# Patient Record
Sex: Male | Born: 1945
Health system: Southern US, Community
[De-identification: ages and names within clinical notes are randomized; demographics above are authoritative.]

## PROBLEM LIST (undated history)

## (undated) DIAGNOSIS — C61 Malignant neoplasm of prostate: Secondary | ICD-10-CM

## (undated) DIAGNOSIS — G5600 Carpal tunnel syndrome, unspecified upper limb: Secondary | ICD-10-CM

## (undated) DIAGNOSIS — G839 Paralytic syndrome, unspecified: Secondary | ICD-10-CM

## (undated) DIAGNOSIS — I10 Essential (primary) hypertension: Secondary | ICD-10-CM

## (undated) DIAGNOSIS — E291 Testicular hypofunction: Secondary | ICD-10-CM

## (undated) DIAGNOSIS — M545 Low back pain, unspecified: Secondary | ICD-10-CM

## (undated) DIAGNOSIS — K219 Gastro-esophageal reflux disease without esophagitis: Secondary | ICD-10-CM

## (undated) DIAGNOSIS — K279 Peptic ulcer, site unspecified, unspecified as acute or chronic, without hemorrhage or perforation: Secondary | ICD-10-CM

## (undated) DIAGNOSIS — F419 Anxiety disorder, unspecified: Secondary | ICD-10-CM

## (undated) DIAGNOSIS — F329 Major depressive disorder, single episode, unspecified: Secondary | ICD-10-CM

## (undated) DIAGNOSIS — N529 Male erectile dysfunction, unspecified: Secondary | ICD-10-CM

## (undated) DIAGNOSIS — G8220 Paraplegia, unspecified: Secondary | ICD-10-CM

## (undated) DIAGNOSIS — Z8546 Personal history of malignant neoplasm of prostate: Secondary | ICD-10-CM

## (undated) DIAGNOSIS — K589 Irritable bowel syndrome without diarrhea: Secondary | ICD-10-CM

## (undated) DIAGNOSIS — G4733 Obstructive sleep apnea (adult) (pediatric): Secondary | ICD-10-CM

## (undated) DIAGNOSIS — S24109A Unspecified injury at unspecified level of thoracic spinal cord, initial encounter: Secondary | ICD-10-CM

## (undated) DIAGNOSIS — R5383 Other fatigue: Secondary | ICD-10-CM

## (undated) DIAGNOSIS — F32A Depression, unspecified: Secondary | ICD-10-CM

## (undated) DIAGNOSIS — J309 Allergic rhinitis, unspecified: Secondary | ICD-10-CM

## (undated) HISTORY — DX: Paralytic syndrome, unspecified: G83.9

## (undated) HISTORY — DX: Essential (primary) hypertension: I10

## (undated) HISTORY — DX: Anxiety disorder, unspecified: F41.9

## (undated) HISTORY — DX: Carpal tunnel syndrome, unspecified upper limb: G56.00

## (undated) HISTORY — DX: Paraplegia, unspecified: G82.20

## (undated) HISTORY — DX: Peptic ulcer, site unspecified, unspecified as acute or chronic, without hemorrhage or perforation: K27.9

## (undated) HISTORY — DX: Personal history of malignant neoplasm of prostate: Z85.46

## (undated) HISTORY — DX: Depression, unspecified: F32.A

## (undated) HISTORY — DX: Other fatigue: R53.83

## (undated) HISTORY — DX: Gastro-esophageal reflux disease without esophagitis: K21.9

## (undated) HISTORY — DX: Low back pain: M54.5

## (undated) HISTORY — PX: OTHER SURGICAL HISTORY: SHX169

## (undated) HISTORY — DX: Major depressive disorder, single episode, unspecified: F32.9

## (undated) HISTORY — DX: Unspecified injury at unspecified level of thoracic spinal cord, initial encounter: S24.109A

## (undated) HISTORY — DX: Malignant neoplasm of prostate: C61

## (undated) HISTORY — PX: PILONIDAL CYST / SINUS EXCISION: SUR543

## (undated) HISTORY — DX: Testicular hypofunction: E29.1

## (undated) HISTORY — DX: Obstructive sleep apnea (adult) (pediatric): G47.33

## (undated) HISTORY — DX: Male erectile dysfunction, unspecified: N52.9

## (undated) HISTORY — DX: Allergic rhinitis, unspecified: J30.9

## (undated) HISTORY — DX: Low back pain, unspecified: M54.50

## (undated) HISTORY — DX: Irritable bowel syndrome, unspecified: K58.9

## (undated) HISTORY — PX: PROSTATECTOMY: SHX69

---

## 1998-10-12 ENCOUNTER — Ambulatory Visit (HOSPITAL_COMMUNITY): Admission: RE | Admit: 1998-10-12 | Discharge: 1998-10-12 | Payer: Self-pay | Admitting: *Deleted

## 1999-07-03 ENCOUNTER — Emergency Department (HOSPITAL_COMMUNITY): Admission: EM | Admit: 1999-07-03 | Discharge: 1999-07-03 | Payer: Self-pay | Admitting: Emergency Medicine

## 1999-07-03 ENCOUNTER — Encounter: Payer: Self-pay | Admitting: Emergency Medicine

## 1999-09-14 ENCOUNTER — Other Ambulatory Visit: Admission: RE | Admit: 1999-09-14 | Discharge: 1999-09-14 | Payer: Self-pay | Admitting: Gastroenterology

## 1999-09-14 ENCOUNTER — Encounter (INDEPENDENT_AMBULATORY_CARE_PROVIDER_SITE_OTHER): Payer: Self-pay | Admitting: Specialist

## 2000-10-12 ENCOUNTER — Encounter: Payer: Self-pay | Admitting: Internal Medicine

## 2000-10-12 ENCOUNTER — Ambulatory Visit (HOSPITAL_COMMUNITY): Admission: RE | Admit: 2000-10-12 | Discharge: 2000-10-12 | Payer: Self-pay | Admitting: Internal Medicine

## 2001-07-25 ENCOUNTER — Emergency Department (HOSPITAL_COMMUNITY): Admission: EM | Admit: 2001-07-25 | Discharge: 2001-07-25 | Payer: Self-pay | Admitting: Emergency Medicine

## 2002-08-14 ENCOUNTER — Ambulatory Visit (HOSPITAL_BASED_OUTPATIENT_CLINIC_OR_DEPARTMENT_OTHER): Admission: RE | Admit: 2002-08-14 | Discharge: 2002-08-14 | Payer: Self-pay | Admitting: Internal Medicine

## 2002-08-14 ENCOUNTER — Encounter: Payer: Self-pay | Admitting: Pulmonary Disease

## 2002-09-17 ENCOUNTER — Encounter: Payer: Self-pay | Admitting: Pulmonary Disease

## 2002-10-06 ENCOUNTER — Encounter: Payer: Self-pay | Admitting: Urology

## 2002-10-09 ENCOUNTER — Encounter (INDEPENDENT_AMBULATORY_CARE_PROVIDER_SITE_OTHER): Payer: Self-pay | Admitting: Specialist

## 2002-10-09 ENCOUNTER — Inpatient Hospital Stay (HOSPITAL_COMMUNITY): Admission: RE | Admit: 2002-10-09 | Discharge: 2002-10-12 | Payer: Self-pay | Admitting: Urology

## 2003-05-07 ENCOUNTER — Ambulatory Visit (HOSPITAL_BASED_OUTPATIENT_CLINIC_OR_DEPARTMENT_OTHER): Admission: RE | Admit: 2003-05-07 | Discharge: 2003-05-07 | Payer: Self-pay | Admitting: Urology

## 2005-04-05 ENCOUNTER — Ambulatory Visit: Payer: Self-pay | Admitting: Internal Medicine

## 2005-08-02 ENCOUNTER — Ambulatory Visit: Payer: Self-pay | Admitting: Pulmonary Disease

## 2006-07-27 ENCOUNTER — Ambulatory Visit: Payer: Self-pay | Admitting: Pulmonary Disease

## 2006-07-30 ENCOUNTER — Ambulatory Visit: Payer: Self-pay | Admitting: Internal Medicine

## 2006-09-18 ENCOUNTER — Ambulatory Visit: Payer: Self-pay | Admitting: Internal Medicine

## 2006-11-09 ENCOUNTER — Ambulatory Visit: Payer: Self-pay | Admitting: Internal Medicine

## 2007-05-23 DIAGNOSIS — G4733 Obstructive sleep apnea (adult) (pediatric): Secondary | ICD-10-CM

## 2007-05-23 DIAGNOSIS — Z8546 Personal history of malignant neoplasm of prostate: Secondary | ICD-10-CM

## 2007-05-23 DIAGNOSIS — I10 Essential (primary) hypertension: Secondary | ICD-10-CM

## 2007-05-23 DIAGNOSIS — K219 Gastro-esophageal reflux disease without esophagitis: Secondary | ICD-10-CM | POA: Insufficient documentation

## 2007-05-23 DIAGNOSIS — S56129A Laceration of flexor muscle, fascia and tendon of unspecified finger at forearm level, initial encounter: Secondary | ICD-10-CM | POA: Insufficient documentation

## 2007-05-23 DIAGNOSIS — G5600 Carpal tunnel syndrome, unspecified upper limb: Secondary | ICD-10-CM

## 2007-05-23 DIAGNOSIS — M109 Gout, unspecified: Secondary | ICD-10-CM

## 2007-05-23 HISTORY — DX: Personal history of malignant neoplasm of prostate: Z85.46

## 2007-08-06 ENCOUNTER — Ambulatory Visit: Payer: Self-pay | Admitting: Pulmonary Disease

## 2007-10-01 ENCOUNTER — Ambulatory Visit: Payer: Self-pay | Admitting: Internal Medicine

## 2007-10-01 DIAGNOSIS — J309 Allergic rhinitis, unspecified: Secondary | ICD-10-CM | POA: Insufficient documentation

## 2007-10-01 DIAGNOSIS — Z8719 Personal history of other diseases of the digestive system: Secondary | ICD-10-CM

## 2007-10-01 DIAGNOSIS — K279 Peptic ulcer, site unspecified, unspecified as acute or chronic, without hemorrhage or perforation: Secondary | ICD-10-CM

## 2007-10-01 DIAGNOSIS — M545 Low back pain: Secondary | ICD-10-CM

## 2007-10-01 DIAGNOSIS — F331 Major depressive disorder, recurrent, moderate: Secondary | ICD-10-CM | POA: Insufficient documentation

## 2007-10-01 DIAGNOSIS — F528 Other sexual dysfunction not due to a substance or known physiological condition: Secondary | ICD-10-CM

## 2007-10-01 DIAGNOSIS — R5383 Other fatigue: Secondary | ICD-10-CM

## 2007-10-01 DIAGNOSIS — F329 Major depressive disorder, single episode, unspecified: Secondary | ICD-10-CM

## 2007-10-01 DIAGNOSIS — F411 Generalized anxiety disorder: Secondary | ICD-10-CM

## 2007-10-01 DIAGNOSIS — F332 Major depressive disorder, recurrent severe without psychotic features: Secondary | ICD-10-CM | POA: Insufficient documentation

## 2007-10-01 HISTORY — DX: Peptic ulcer, site unspecified, unspecified as acute or chronic, without hemorrhage or perforation: K27.9

## 2007-10-01 HISTORY — DX: Allergic rhinitis, unspecified: J30.9

## 2007-12-01 ENCOUNTER — Emergency Department (HOSPITAL_COMMUNITY): Admission: EM | Admit: 2007-12-01 | Discharge: 2007-12-01 | Payer: Self-pay | Admitting: Emergency Medicine

## 2007-12-13 ENCOUNTER — Ambulatory Visit (HOSPITAL_BASED_OUTPATIENT_CLINIC_OR_DEPARTMENT_OTHER): Admission: RE | Admit: 2007-12-13 | Discharge: 2007-12-13 | Payer: Self-pay | Admitting: Orthopedic Surgery

## 2008-01-09 ENCOUNTER — Encounter: Payer: Self-pay | Admitting: Internal Medicine

## 2008-01-27 ENCOUNTER — Encounter: Payer: Self-pay | Admitting: Internal Medicine

## 2008-02-03 ENCOUNTER — Telehealth: Payer: Self-pay | Admitting: Pulmonary Disease

## 2008-02-17 ENCOUNTER — Encounter: Payer: Self-pay | Admitting: Internal Medicine

## 2008-02-20 ENCOUNTER — Encounter: Payer: Self-pay | Admitting: Pulmonary Disease

## 2008-03-31 ENCOUNTER — Ambulatory Visit: Payer: Self-pay | Admitting: Internal Medicine

## 2008-03-31 DIAGNOSIS — E291 Testicular hypofunction: Secondary | ICD-10-CM

## 2008-03-31 HISTORY — DX: Testicular hypofunction: E29.1

## 2008-04-15 ENCOUNTER — Encounter: Payer: Self-pay | Admitting: Pulmonary Disease

## 2008-07-28 ENCOUNTER — Ambulatory Visit: Payer: Self-pay | Admitting: Internal Medicine

## 2008-07-28 DIAGNOSIS — S8990XA Unspecified injury of unspecified lower leg, initial encounter: Secondary | ICD-10-CM

## 2008-07-28 DIAGNOSIS — S79912A Unspecified injury of left hip, initial encounter: Secondary | ICD-10-CM

## 2008-07-28 DIAGNOSIS — L089 Local infection of the skin and subcutaneous tissue, unspecified: Secondary | ICD-10-CM

## 2008-07-28 DIAGNOSIS — S90919A Unspecified superficial injury of unspecified ankle, initial encounter: Secondary | ICD-10-CM | POA: Insufficient documentation

## 2008-07-28 DIAGNOSIS — S99919A Unspecified injury of unspecified ankle, initial encounter: Secondary | ICD-10-CM

## 2008-07-28 DIAGNOSIS — S79929A Unspecified injury of unspecified thigh, initial encounter: Secondary | ICD-10-CM

## 2008-08-03 ENCOUNTER — Ambulatory Visit: Payer: Self-pay | Admitting: Pulmonary Disease

## 2008-10-06 ENCOUNTER — Ambulatory Visit: Payer: Self-pay | Admitting: Internal Medicine

## 2008-10-06 LAB — CONVERTED CEMR LAB
ALT: 33 units/L (ref 0–53)
AST: 28 units/L (ref 0–37)
Albumin: 3.9 g/dL (ref 3.5–5.2)
BUN: 20 mg/dL (ref 6–23)
Basophils Relative: 0.7 % (ref 0.0–3.0)
Chloride: 103 meq/L (ref 96–112)
Cholesterol: 151 mg/dL (ref 0–200)
Creatinine, Ser: 1 mg/dL (ref 0.4–1.5)
Eosinophils Absolute: 0.1 10*3/uL (ref 0.0–0.7)
Eosinophils Relative: 1.7 % (ref 0.0–5.0)
GFR calc non Af Amer: 81 mL/min
HCT: 46.4 % (ref 39.0–52.0)
Hemoglobin: 16.2 g/dL (ref 13.0–17.0)
MCV: 90 fL (ref 78.0–100.0)
Neutrophils Relative %: 66.3 % (ref 43.0–77.0)
PSA: 0.01 ng/mL — ABNORMAL LOW (ref 0.10–4.00)
RBC: 5.15 M/uL (ref 4.22–5.81)
Specific Gravity, Urine: 1.025 (ref 1.000–1.03)
Total Protein, Urine: NEGATIVE mg/dL
Urine Glucose: NEGATIVE mg/dL
Urobilinogen, UA: 0.2 (ref 0.0–1.0)
WBC: 7.3 10*3/uL (ref 4.5–10.5)
pH: 5 (ref 5.0–8.0)

## 2008-10-12 ENCOUNTER — Ambulatory Visit: Payer: Self-pay | Admitting: Internal Medicine

## 2008-10-12 DIAGNOSIS — J069 Acute upper respiratory infection, unspecified: Secondary | ICD-10-CM | POA: Insufficient documentation

## 2008-10-12 DIAGNOSIS — R269 Unspecified abnormalities of gait and mobility: Secondary | ICD-10-CM

## 2008-10-15 ENCOUNTER — Encounter (INDEPENDENT_AMBULATORY_CARE_PROVIDER_SITE_OTHER): Payer: Self-pay | Admitting: *Deleted

## 2008-10-17 ENCOUNTER — Encounter: Admission: RE | Admit: 2008-10-17 | Discharge: 2008-10-17 | Payer: Self-pay | Admitting: Internal Medicine

## 2008-10-20 ENCOUNTER — Telehealth (INDEPENDENT_AMBULATORY_CARE_PROVIDER_SITE_OTHER): Payer: Self-pay | Admitting: *Deleted

## 2008-11-25 ENCOUNTER — Encounter: Payer: Self-pay | Admitting: Internal Medicine

## 2008-11-26 ENCOUNTER — Encounter: Payer: Self-pay | Admitting: Internal Medicine

## 2008-12-09 ENCOUNTER — Telehealth: Payer: Self-pay | Admitting: Internal Medicine

## 2009-01-25 ENCOUNTER — Encounter: Payer: Self-pay | Admitting: Internal Medicine

## 2009-02-12 ENCOUNTER — Inpatient Hospital Stay (HOSPITAL_COMMUNITY): Admission: RE | Admit: 2009-02-12 | Discharge: 2009-02-18 | Payer: Self-pay | Admitting: Neurosurgery

## 2009-02-12 ENCOUNTER — Ambulatory Visit: Payer: Self-pay | Admitting: Pulmonary Disease

## 2009-02-12 ENCOUNTER — Encounter (INDEPENDENT_AMBULATORY_CARE_PROVIDER_SITE_OTHER): Payer: Self-pay | Admitting: Neurosurgery

## 2009-02-16 ENCOUNTER — Ambulatory Visit: Payer: Self-pay | Admitting: Physical Medicine & Rehabilitation

## 2009-02-18 ENCOUNTER — Inpatient Hospital Stay (HOSPITAL_COMMUNITY)
Admission: RE | Admit: 2009-02-18 | Discharge: 2009-03-05 | Payer: Self-pay | Admitting: Physical Medicine & Rehabilitation

## 2009-03-05 ENCOUNTER — Encounter: Payer: Self-pay | Admitting: Internal Medicine

## 2009-03-08 ENCOUNTER — Telehealth (INDEPENDENT_AMBULATORY_CARE_PROVIDER_SITE_OTHER): Payer: Self-pay | Admitting: *Deleted

## 2009-03-10 ENCOUNTER — Emergency Department (HOSPITAL_COMMUNITY): Admission: EM | Admit: 2009-03-10 | Discharge: 2009-03-10 | Payer: Self-pay | Admitting: Emergency Medicine

## 2009-04-07 ENCOUNTER — Encounter
Admission: RE | Admit: 2009-04-07 | Discharge: 2009-06-04 | Payer: Self-pay | Admitting: Physical Medicine & Rehabilitation

## 2009-04-08 ENCOUNTER — Encounter: Payer: Self-pay | Admitting: Internal Medicine

## 2009-04-13 ENCOUNTER — Encounter: Payer: Self-pay | Admitting: Pulmonary Disease

## 2009-04-23 ENCOUNTER — Ambulatory Visit: Payer: Self-pay | Admitting: Internal Medicine

## 2009-04-23 DIAGNOSIS — G8220 Paraplegia, unspecified: Secondary | ICD-10-CM

## 2009-04-23 DIAGNOSIS — G839 Paralytic syndrome, unspecified: Secondary | ICD-10-CM

## 2009-04-23 HISTORY — DX: Paraplegia, unspecified: G82.20

## 2009-04-23 HISTORY — DX: Paralytic syndrome, unspecified: G83.9

## 2009-05-18 ENCOUNTER — Encounter
Admission: RE | Admit: 2009-05-18 | Discharge: 2009-08-16 | Payer: Self-pay | Admitting: Physical Medicine & Rehabilitation

## 2009-05-19 ENCOUNTER — Ambulatory Visit: Payer: Self-pay | Admitting: Physical Medicine & Rehabilitation

## 2009-06-30 ENCOUNTER — Ambulatory Visit: Payer: Self-pay | Admitting: Physical Medicine & Rehabilitation

## 2009-08-04 ENCOUNTER — Ambulatory Visit: Payer: Self-pay | Admitting: Pulmonary Disease

## 2009-08-10 ENCOUNTER — Ambulatory Visit: Payer: Self-pay | Admitting: Physical Medicine & Rehabilitation

## 2009-09-01 ENCOUNTER — Encounter (INDEPENDENT_AMBULATORY_CARE_PROVIDER_SITE_OTHER): Payer: Self-pay | Admitting: *Deleted

## 2009-09-20 ENCOUNTER — Encounter
Admission: RE | Admit: 2009-09-20 | Discharge: 2009-11-03 | Payer: Self-pay | Admitting: Physical Medicine & Rehabilitation

## 2009-09-21 ENCOUNTER — Ambulatory Visit: Payer: Self-pay | Admitting: Physical Medicine & Rehabilitation

## 2009-10-11 ENCOUNTER — Encounter: Payer: Self-pay | Admitting: Internal Medicine

## 2009-10-20 ENCOUNTER — Ambulatory Visit: Payer: Self-pay | Admitting: Internal Medicine

## 2009-10-20 LAB — CONVERTED CEMR LAB
ALT: 20 units/L (ref 0–53)
AST: 21 units/L (ref 0–37)
BUN: 23 mg/dL (ref 6–23)
Bilirubin, Direct: 0.1 mg/dL (ref 0.0–0.3)
Calcium: 9.2 mg/dL (ref 8.4–10.5)
Creatinine, Ser: 1 mg/dL (ref 0.4–1.5)
Eosinophils Relative: 2.4 % (ref 0.0–5.0)
GFR calc non Af Amer: 80.55 mL/min (ref >60)
Hemoglobin, Urine: NEGATIVE
Monocytes Relative: 8.1 % (ref 3.0–12.0)
Neutrophils Relative %: 68.5 % (ref 43.0–77.0)
Platelets: 213 10*3/uL (ref 150.0–400.0)
TSH: 1.55 microintl units/mL (ref 0.35–5.50)
Total Bilirubin: 0.7 mg/dL (ref 0.3–1.2)
Total CHOL/HDL Ratio: 5
Triglycerides: 209 mg/dL — ABNORMAL HIGH (ref 0.0–149.0)
Urine Glucose: NEGATIVE mg/dL
Urobilinogen, UA: 0.2 (ref 0.0–1.0)
VLDL: 41.8 mg/dL — ABNORMAL HIGH (ref 0.0–40.0)
WBC: 6.2 10*3/uL (ref 4.5–10.5)

## 2009-10-27 ENCOUNTER — Encounter: Payer: Self-pay | Admitting: Internal Medicine

## 2009-11-23 ENCOUNTER — Encounter: Payer: Self-pay | Admitting: Internal Medicine

## 2009-12-24 ENCOUNTER — Encounter
Admission: RE | Admit: 2009-12-24 | Discharge: 2009-12-29 | Payer: Self-pay | Admitting: Physical Medicine & Rehabilitation

## 2009-12-29 ENCOUNTER — Ambulatory Visit: Payer: Self-pay | Admitting: Physical Medicine & Rehabilitation

## 2010-04-06 ENCOUNTER — Encounter: Payer: Self-pay | Admitting: Pulmonary Disease

## 2010-04-13 ENCOUNTER — Encounter: Payer: Self-pay | Admitting: Internal Medicine

## 2010-05-18 ENCOUNTER — Encounter
Admission: RE | Admit: 2010-05-18 | Discharge: 2010-05-27 | Payer: Self-pay | Admitting: Physical Medicine & Rehabilitation

## 2010-05-24 ENCOUNTER — Telehealth: Payer: Self-pay | Admitting: Gastroenterology

## 2010-05-27 ENCOUNTER — Ambulatory Visit: Payer: Self-pay | Admitting: Physical Medicine & Rehabilitation

## 2010-08-01 ENCOUNTER — Encounter: Payer: Self-pay | Admitting: Internal Medicine

## 2010-08-03 ENCOUNTER — Ambulatory Visit: Payer: Self-pay | Admitting: Pulmonary Disease

## 2010-08-25 ENCOUNTER — Telehealth: Payer: Self-pay | Admitting: Internal Medicine

## 2010-08-25 ENCOUNTER — Inpatient Hospital Stay (HOSPITAL_COMMUNITY): Admission: EM | Admit: 2010-08-25 | Discharge: 2010-08-28 | Payer: Self-pay | Admitting: Emergency Medicine

## 2010-09-01 ENCOUNTER — Ambulatory Visit: Payer: Self-pay | Admitting: Internal Medicine

## 2010-09-01 DIAGNOSIS — N390 Urinary tract infection, site not specified: Secondary | ICD-10-CM | POA: Insufficient documentation

## 2010-09-01 DIAGNOSIS — M549 Dorsalgia, unspecified: Secondary | ICD-10-CM | POA: Insufficient documentation

## 2010-09-19 ENCOUNTER — Encounter
Admission: RE | Admit: 2010-09-19 | Discharge: 2010-12-06 | Payer: Self-pay | Source: Home / Self Care | Attending: Physical Medicine & Rehabilitation | Admitting: Physical Medicine & Rehabilitation

## 2010-09-27 ENCOUNTER — Ambulatory Visit: Payer: Self-pay | Admitting: Physical Medicine & Rehabilitation

## 2010-11-03 ENCOUNTER — Ambulatory Visit: Payer: Self-pay | Admitting: Internal Medicine

## 2010-11-03 DIAGNOSIS — G4700 Insomnia, unspecified: Secondary | ICD-10-CM

## 2010-11-03 LAB — CONVERTED CEMR LAB
Albumin: 4.3 g/dL (ref 3.5–5.2)
Basophils Absolute: 0 10*3/uL (ref 0.0–0.1)
Basophils Relative: 0.2 % (ref 0.0–3.0)
CO2: 28 meq/L (ref 19–32)
Calcium: 8.9 mg/dL (ref 8.4–10.5)
Chloride: 101 meq/L (ref 96–112)
Cholesterol: 144 mg/dL (ref 0–200)
Eosinophils Absolute: 0.2 10*3/uL (ref 0.0–0.7)
Glucose, Bld: 75 mg/dL (ref 70–99)
HCT: 48.3 % (ref 39.0–52.0)
HDL: 32.5 mg/dL — ABNORMAL LOW (ref >39.00)
Hemoglobin: 16.6 g/dL (ref 13.0–17.0)
Lymphs Abs: 2.1 10*3/uL (ref 0.7–4.0)
MCHC: 34.3 g/dL (ref 30.0–36.0)
MCV: 90.4 fL (ref 78.0–100.0)
Monocytes Absolute: 0.6 10*3/uL (ref 0.1–1.0)
Neutro Abs: 5.6 10*3/uL (ref 1.4–7.7)
RBC: 5.34 M/uL (ref 4.22–5.81)
RDW: 14 % (ref 11.5–14.6)
Sodium: 138 meq/L (ref 135–145)
TSH: 1.81 microintl units/mL (ref 0.35–5.50)
Total Protein: 7 g/dL (ref 6.0–8.3)
VLDL: 31.4 mg/dL (ref 0.0–40.0)

## 2010-12-04 LAB — CONVERTED CEMR LAB
Basophils Absolute: 0 10*3/uL (ref 0.0–0.1)
Bilirubin, Direct: 0.2 mg/dL (ref 0.0–0.3)
Cholesterol: 169 mg/dL (ref 0–200)
Eosinophils Absolute: 0.1 10*3/uL (ref 0.0–0.6)
Eosinophils Relative: 1.3 % (ref 0.0–5.0)
GFR calc Af Amer: 73 mL/min
GFR calc non Af Amer: 60 mL/min
Glucose, Bld: 99 mg/dL (ref 70–99)
HCT: 47 % (ref 39.0–52.0)
HDL: 32.4 mg/dL — ABNORMAL LOW (ref >39.0)
Hemoglobin, Urine: NEGATIVE
LDL Cholesterol: 99 mg/dL (ref 0–99)
Lymphocytes Relative: 26.3 % (ref 12.0–46.0)
MCHC: 34.6 g/dL (ref 30.0–36.0)
MCV: 89.6 fL (ref 78.0–100.0)
Neutro Abs: 4.9 10*3/uL (ref 1.4–7.7)
Neutrophils Relative %: 64.6 % (ref 43.0–77.0)
Nitrite: NEGATIVE
PSA: 0.01 ng/mL — ABNORMAL LOW (ref 0.10–4.00)
Platelets: 251 10*3/uL (ref 150–400)
Sodium: 141 meq/L (ref 135–145)
TSH: 1.44 microintl units/mL (ref 0.35–5.50)
Testosterone: 206.18 ng/dL — ABNORMAL LOW (ref 350.00–890)
Total CHOL/HDL Ratio: 5.2
Triglycerides: 188 mg/dL — ABNORMAL HIGH (ref 0–149)
Urine Glucose: NEGATIVE mg/dL
WBC: 7.6 10*3/uL (ref 4.5–10.5)

## 2010-12-06 NOTE — Progress Notes (Signed)
Summary: Schedule Colonoscopy  Phone Note Outgoing Call Call back at Home Phone [phone]   Call placed by: Harlow Mares CMA [facility] Dull),  May 24, 2010 10:29 AM Call placed to: Patient Wife Summary of Call: Lorain Childes Spoke to the patients wife she states that the patient is paralized he is unable to have his colonoscopy or do the prep. His wife does not feel that she can help him.   Initial call taken by: Harlow Mares CMA [facility] Dull),  May 24, 2010 10:47 AM  Follow-up for Phone Call        ok to cx colonoscopy Follow-up by: Louis Meckel MD,  May 25, 2010 9:07 AM  Additional Follow-up for Phone Call Additional follow up Details #1::        cx colonoscopy recall per MD Additional Follow-up by: Harlow Mares CMA [facility] Dull),  May 31, 2010 12:19 PM

## 2010-12-06 NOTE — Letter (Signed)
Summary: [facility]  [facility]   Imported By: Sherian Rein 04/18/2010 08:36:06  _____________________________________________________________________  External Attachment:    Type:   Image     Comment:   External Document

## 2010-12-06 NOTE — Assessment & Plan Note (Signed)
Summary: post hosp/uti/lb   Vital Signs:  Patient profile:   65 year old male Height:      68 inches Weight:      199 pounds BMI:     30.37 O2 Sat:      97 % on Room air Temp:     98.5 degrees F oral Pulse rate:   67 / minute BP sitting:   100 / 62  (left arm) Cuff size:   regular  Vitals Entered By: Zella Ball Ewing CMA [facility] Dull) (September 01, 2010 2:35 PM)  O2 Flow:  Room air  CC: Hospital followup/RE   Primary Care Isahia Hollerbach:  Corwin Levins MD  CC:  Hospital followup/RE.  History of Present Illness: here after [facility] hospn for cipro, finished antibx yesterday, feels well overall without fever, and Pt denies CP, worsening sob, doe, wheezing, orthopnea, pnd, worsening LE edema, palps, dizziness or syncope  Pt denies new neuro symptoms such as headache, facial or extremity weakness except for chronic paraplegia changes. No fever, wt loss, night sweats, loss of appetite or other constitutional symptoms Pt denies polydipsia, polyuria.  Overall good compliance with meds, trying to follow low chol diet, wt stable, little excercise however  No back pain, and no worsening urine change in color or odor or blood.  Wife currently ill with bronchitis, and she is concerned about him, but he denies ST , cough;  does have a sort of tickle to the throat but no more than that. Needs etodolac refill, no GI symtpoms such as pain, n/v, except for mild ongoing constipation that is manageable.  Ongonig back pain stable , mild, Denies worsening depressive symtpoms, suicidal ideation or panic.    Problems Prior to Update: 1)  Spastic Paralysis  (ICD-344.9) 2)  Paraplegia  (ICD-344.1) 3)  Uri  (ICD-465.9) 4)  Abnormality of Gait  (ICD-781.2) 5)  Preventive Health Care  (ICD-V70.0) 6)  Abrasion, Leg, Infected  (ICD-916.9) 7)  Hypogonadism  (ICD-257.2) 8)  Family History Diabetes 1st Degree Relative  (ICD-V18.0) 9)  Low Back Pain  (ICD-724.2) 10)  Gout  (ICD-274.9) 11)  Irritable Bowel Syndrome, Hx of   (ICD-V12.79) 12)  Peptic Ulcer Disease  (ICD-533.90) 13)  Allergic Rhinitis  (ICD-477.9) 14)  Depression  (ICD-311) 15)  Anxiety  (ICD-300.00) 16)  Preventive Health Care  (ICD-V70.0) 17)  Erectile Dysfunction  (ICD-302.72) 18)  Fatigue  (ICD-780.79) 19)  Obstructive Sleep Apnea  (ICD-327.23) 20)  Gout Nos  (ICD-274.9) 21)  Gerd  (ICD-530.81) 22)  Prostate Cancer, Hx of  (ICD-V10.46) 23)  Hypertension  (ICD-401.9) 24)  Carpal Tunnel Syndrome, Right  (ICD-354.0)  Medications Prior to Update: 1)  Losartan Potassium 50 Mg Tabs (Losartan Potassium) .Marland Kitchen.. 1po Once Daily 2)  Ecotrin Low Strength 81 Mg  Tbec (Aspirin) .Marland Kitchen.. 1 Tablet By Mouth Two Times A Day 3)  Etodolac 400 Mg Tabs (Etodolac) .Marland Kitchen.. 1 By Mouth Two Times A Day As Needed 4)  Senokot 8.6 Mg Tabs (Sennosides) .... 2 At Bedtime 5)  Laxative Supp .... Use 1 Supp Every Morning As Needed 6)  Baclofen 10 Mg Tabs (Baclofen) .... Take 1 Tab By Mouth Every 6 Hours 7)  Oxybutynin Chloride 5 Mg Tabs (Oxybutynin Chloride) .... Take 1 Tablet By Mouth Four Times A Day 8)  Tizanidine Hcl 4 Mg Tabs (Tizanidine Hcl) .Marland Kitchen.. 1 Tab By Mouth Every 6 Hours 9)  Clonazepam 2 Mg Tabs (Clonazepam) .Marland Kitchen.. 1 By Mouth Once Two Times A Day 10)  Coq10 100 Mg Caps (  Coenzyme Q10) .Marland Kitchen.. 1 By Mouth Once Daily 11)  Vitamin E 400 Unit Caps (Vitamin E) .... 2 By Mouth Once Daily  Current Medications (verified): 1)  Losartan Potassium 50 Mg Tabs (Losartan Potassium) .Marland Kitchen.. 1po Once Daily 2)  Ecotrin Low Strength 81 Mg  Tbec (Aspirin) .Marland Kitchen.. 1 Tablet By Mouth Two Times A Day 3)  Etodolac 400 Mg Tabs (Etodolac) .Marland Kitchen.. 1 By Mouth Two Times A Day As Needed 4)  Senokot 8.6 Mg Tabs (Sennosides) .... 2 At Bedtime 5)  Laxative Supp .... Use 1 Supp Every Morning As Needed 6)  Baclofen 10 Mg Tabs (Baclofen) .... Take 1 Tab By Mouth Every 6 Hours 7)  Oxybutynin Chloride 5 Mg Tabs (Oxybutynin Chloride) .... Take 1 Tablet By Mouth Four Times A Day 8)  Tizanidine Hcl 4 Mg Tabs (Tizanidine  Hcl) .... 2 Tab By Mouth Every 6 Hours 9)  Clonazepam 2 Mg Tabs (Clonazepam) .... 1/2 By Mouth Qam, and 1 By Mouth Qhs 10)  Vitamin E 400 Unit Caps (Vitamin E) .... 2 By Mouth Once Daily  Allergies (verified): 1)  ! Amoxicillin 2)  ! * Endal Hd  Past History:  Past Medical History: Last updated: 02/20/2008 FAMILY HISTORY DIABETES 1ST DEGREE RELATIVE (ICD-V18.0) LOW BACK PAIN (ICD-724.2) GOUT (ICD-274.9) IRRITABLE BOWEL SYNDROME, HX OF (ICD-V12.79) PEPTIC ULCER DISEASE (ICD-533.90) ALLERGIC RHINITIS (ICD-477.9) DEPRESSION (ICD-311) ANXIETY (ICD-300.00) PREVENTIVE HEALTH CARE (ICD-V70.0) ERECTILE DYSFUNCTION (ICD-302.72) FATIGUE (ICD-780.79) OBSTRUCTIVE SLEEP APNEA (ICD-327.23) GOUT NOS (ICD-274.9) GERD (ICD-530.81) PROSTATE CANCER, HX OF (ICD-V10.46) HYPERTENSION (ICD-401.9) CARPAL TUNNEL SYNDROME, RIGHT (ICD-354.0)  Past Surgical History: Last updated: 03/31/2008 Prostatectomy Inguinal herniorrhaphy Tonsillectomy s/p pilonidal cyst s/p left leg surgury after fibula fx  Social History: Last updated: 10/12/2008 Never Smoked Alcohol use-yes Married no children retired - former Data processing manager for [employer]  Risk Factors: Smoking Status: never (10/01/2007)  Review of Systems       all otherwise negative per pt -    Physical Exam  General:  alert and overweight-appearing. , well appearing, bright , alert , not confused Head:  normocephalic and atraumatic.   Eyes:  vision grossly intact, pupils equal, and pupils round.   Ears:  R ear normal and L ear normal.   Nose:  no external deformity and no nasal discharge.   Mouth:  no gingival abnormalities and pharynx pink and moist.   Neck:  supple and no masses.   Lungs:  normal respiratory effort and normal breath sounds.   Heart:  normal rate and regular rhythm.   Abdomen:  soft, non-tender, and normal bowel sounds.   Msk:  spine nontender, no swelling Extremities:  no edema, no erythema    Neurologic:  paraplegia   Impression & Recommendations:  Problem # 1:  UTI (ICD-599.0)  His updated medication list for this problem includes:    Oxybutynin Chloride 5 Mg Tabs (Oxybutynin chloride) .Marland Kitchen... Take 1 tablet by mouth four times a day resolved, exam benign todya, reassured, declines repeat UA today  Problem # 2:  HYPERTENSION (ICD-401.9)  His updated medication list for this problem includes:    Losartan Potassium 50 Mg Tabs (Losartan potassium) .Marland Kitchen... 1po once daily  BP today: 100/62 Prior BP: 108/72 (08/03/2010)  Labs Reviewed: K+: 3.6 (10/20/2009) Creat: : 1.0 (10/20/2009)   Chol: 142 (10/20/2009)   HDL: 30.90 (10/20/2009)   LDL: 80 (10/06/2008)   TG: 209.0 (10/20/2009) stable overall by hx and exam, ok to continue meds/tx as is   Problem # 3:  BACK  PAIN (ICD-724.5)  His updated medication list for this problem includes:    Ecotrin Low Strength 81 Mg Tbec (Aspirin) .Marland Kitchen... 1 tablet by mouth two times a day    Etodolac 400 Mg Tabs (Etodolac) .Marland Kitchen... 1 by mouth two times a day as needed    Baclofen 10 Mg Tabs (Baclofen) .Marland Kitchen... Take 1 tab by mouth every 6 hours    Tizanidine Hcl 4 Mg Tabs (Tizanidine hcl) .Marland Kitchen... 2 tab by mouth every 6 hours stable overall by hx and exam, ok to continue meds/tx as is  - f/u any worsening s/s  Problem # 4:  ANXIETY (ICD-300.00)  His updated medication list for this problem includes:    Clonazepam 2 Mg Tabs (Clonazepam) .Marland Kitchen... 1/2 by mouth qam, and 1 by mouth qhs stable overall by hx and exam, ok to continue meds/tx as is   Complete Medication List: 1)  Losartan Potassium 50 Mg Tabs (Losartan potassium) .Marland Kitchen.. 1po once daily 2)  Ecotrin Low Strength 81 Mg Tbec (Aspirin) .Marland Kitchen.. 1 tablet by mouth two times a day 3)  Etodolac 400 Mg Tabs (Etodolac) .Marland Kitchen.. 1 by mouth two times a day as needed 4)  Senokot 8.6 Mg Tabs (Sennosides) .... 2 at bedtime 5)  Laxative Supp  .... Use 1 supp every morning as needed 6)  Baclofen 10 Mg Tabs (Baclofen) .... Take  1 tab by mouth every 6 hours 7)  Oxybutynin Chloride 5 Mg Tabs (Oxybutynin chloride) .... Take 1 tablet by mouth four times a day 8)  Tizanidine Hcl 4 Mg Tabs (Tizanidine hcl) .... 2 tab by mouth every 6 hours 9)  Clonazepam 2 Mg Tabs (Clonazepam) .... 1/2 by mouth qam, and 1 by mouth qhs 10)  Vitamin E 400 Unit Caps (Vitamin e) .... 2 by mouth once daily  Patient Instructions: 1)  Continue all previous medications as before this visit  2)  Please schedule a follow-up appointment in 2 months with CPX labs Prescriptions: ETODOLAC 400 MG TABS (ETODOLAC) 1 by mouth two times a day as needed  #60 x 5   Entered and Authorized by:   Corwin Levins MD   Signed by:   Corwin Levins MD on 09/01/2010   Method used:   Electronically to        [facility] [phone]* (retail)       [address]       [place], [place]  [address]       Ph: [phone]       Fax: [phone]   RxID:   [id number]    Orders Added: 1)  Est. Patient Level IV [66440]

## 2010-12-06 NOTE — Letter (Signed)
Summary: [facility]  [facility]   Imported By: Lester Moccasin 04/12/2010 11:50:27  _____________________________________________________________________  External Attachment:    Type:   Image     Comment:   External Document

## 2010-12-06 NOTE — Progress Notes (Signed)
Summary: OV??  Phone Note Call from Patient   Caller: Patient Summary of Call: pt states yesterday aftn he tried to stand up at the sink and he got real hot.  His BP readings have been flucuating and he ran a fever of 103 lastnight.  Today his temp is normal but his BP is low.  Pt states he has felt ok but his wife said his eyes were red lastnight.   Pt denies dizziness, chest pain, N&V.  Does he need OV or should he just keep monitoring BP? Initial call taken by: Lanier Prude, [employer]),  August 25, 2010 2:46 PM  Follow-up for Phone Call        fever and low blood pressure could indicate early sepsis - I would go to ER for eval now Follow-up by: Corwin Levins MD,  August 25, 2010 3:38 PM  Additional Follow-up for Phone Call Additional follow up Details #1::        pt informed  Additional Follow-up by: Lanier Prude, [facility]),  August 25, 2010 3:42 PM

## 2010-12-06 NOTE — Letter (Signed)
Summary: [facility]  [facility]   Imported By: Sherian Rein 08/03/2010 15:03:39  _____________________________________________________________________  External Attachment:    Type:   Image     Comment:   External Document

## 2010-12-06 NOTE — Assessment & Plan Note (Signed)
Summary: rov for osa   Primary Provider/Referring Provider:  Corwin Levins MD  CC:  1 year follow up. Pt was not able to weigh. Pt states he is using his cpap 7/7 nights x 6-8 hrs a night. Pt states he is not having problems his machine. Pt states he will need a new mask in the middle of oct. .  History of Present Illness: the pt comes in today for f/u of his known osa.  He is wearing cpap compliantly, and has no issues with mask or pressure.  He is due for new mask and supplies.  He feels that he is sleeping well, and denies alertness issues during the day.  Current Medications (verified): 1)  Losartan Potassium 50 Mg Tabs (Losartan Potassium) .Marland Kitchen.. 1po Once Daily 2)  Ecotrin Low Strength 81 Mg  Tbec (Aspirin) .Marland Kitchen.. 1 Tablet By Mouth Two Times A Day 3)  Etodolac 400 Mg Tabs (Etodolac) .Marland Kitchen.. 1 By Mouth Two Times A Day As Needed 4)  Senokot 8.6 Mg Tabs (Sennosides) .... 2 At Bedtime 5)  Laxative Supp .... Use 1 Supp Every Morning As Needed 6)  Baclofen 10 Mg Tabs (Baclofen) .... Take 1 Tab By Mouth Every 6 Hours 7)  Oxybutynin Chloride 5 Mg Tabs (Oxybutynin Chloride) .... Take 1 Tablet By Mouth Four Times A Day 8)  Tizanidine Hcl 4 Mg Tabs (Tizanidine Hcl) .Marland Kitchen.. 1 Tab By Mouth Every 6 Hours 9)  Clonazepam 2 Mg Tabs (Clonazepam) .Marland Kitchen.. 1 By Mouth Once Two Times A Day 10)  Coq10 100 Mg Caps (Coenzyme Q10) .Marland Kitchen.. 1 By Mouth Once Daily 11)  Vitamin E 400 Unit Caps (Vitamin E) .... 2 By Mouth Once Daily  Allergies (verified): 1)  ! Amoxicillin 2)  ! * Endal Hd  Past History:  Past medical, surgical, family and social histories (including risk factors) reviewed, and no changes noted (except as noted below).  Past Medical History: Reviewed history from 02/20/2008 and no changes required. FAMILY HISTORY DIABETES 1ST DEGREE RELATIVE (ICD-V18.0) LOW BACK PAIN (ICD-724.2) GOUT (ICD-274.9) IRRITABLE BOWEL SYNDROME, HX OF (ICD-V12.79) PEPTIC ULCER DISEASE (ICD-533.90) ALLERGIC RHINITIS  (ICD-477.9) DEPRESSION (ICD-311) ANXIETY (ICD-300.00) PREVENTIVE HEALTH CARE (ICD-V70.0) ERECTILE DYSFUNCTION (ICD-302.72) FATIGUE (ICD-780.79) OBSTRUCTIVE SLEEP APNEA (ICD-327.23) GOUT NOS (ICD-274.9) GERD (ICD-530.81) PROSTATE CANCER, HX OF (ICD-V10.46) HYPERTENSION (ICD-401.9) CARPAL TUNNEL SYNDROME, RIGHT (ICD-354.0)  Past Surgical History: Reviewed history from 03/31/2008 and no changes required. Prostatectomy Inguinal herniorrhaphy Tonsillectomy s/p pilonidal cyst s/p left leg surgury after fibula fx  Family History: Reviewed history from 10/01/2007 and no changes required. Family History Diabetes 1st degree relative  Social History: Reviewed history from 10/12/2008 and no changes required. Never Smoked Alcohol use-yes Married no children retired - former Data processing manager for [employer]  Review of Systems       The patient complains of nasal congestion/difficulty breathing through nose and hand/feet swelling.  The patient denies shortness of breath with activity, shortness of breath at rest, productive cough, non-productive cough, coughing up blood, chest pain, irregular heartbeats, acid heartburn, indigestion, loss of appetite, weight change, abdominal pain, difficulty swallowing, sore throat, tooth/dental problems, headaches, itching, ear ache, anxiety, depression, joint stiffness or pain, rash, change in color of mucus, and fever.    Vital Signs:  Patient profile:   65 year old male Height:      68 inches O2 Sat:      97 % on Room air Temp:     97.9 degrees F oral Pulse rate:   79 /  minute BP sitting:   108 / 72  (right arm) Cuff size:   large  Vitals Entered By: Carver Fila (August 03, 2010 2:03 PM)  O2 Flow:  Room air CC: 1 year follow up. Pt was not able to weigh. Pt states he is using his cpap 7/7 nights x 6-8 hrs a night. Pt states he is not having problems his machine. Pt states he will need a new mask in the middle of oct.   Comments meds and allergies updated Phone number updated Carver Fila  August 03, 2010 2:03 PM    Physical Exam  General:  wd male in nad Nose:  no skin breakdown or pressure necrosis from cpap mask Extremities:  mild LE edema, no cyanosis  Neurologic:  alert and oriented,moves UE, paralyzed LE not sleepy.   Impression & Recommendations:  Problem # 1:  OBSTRUCTIVE SLEEP APNEA (ICD-327.23) the pt is doing well with cpap, and has excellent compliance on download.  He is having no issues with cpap mask or pressure, and feels the machine is helping him.  I have asked him to continue with cpap, and let me know if any issues.  Will send an order to his dme to get him new mask and supplies, and will see him back in one year.  Medications Added to Medication List This Visit: 1)  Ecotrin Low Strength 81 Mg Tbec (Aspirin) .Marland Kitchen.. 1 tablet by mouth two times a day 2)  Clonazepam 2 Mg Tabs (Clonazepam) .Marland Kitchen.. 1 by mouth once two times a day 3)  Vitamin E 400 Unit Caps (Vitamin e) .... 2 by mouth once daily  Other Orders: Est. Patient Level III (09811) DME Referral (DME) Admin 1st Vaccine (91478) Flu Vaccine 36yrs + (29562)  Patient Instructions: 1)  will send order to dme for new mask and supplies 2)  please call if any issues with cpap machine. 3)  work on weight loss.            Flu Vaccine Consent Questions     Do you have a history of severe allergic reactions to this vaccine? no    Any prior history of allergic reactions to egg and/or gelatin? no    Do you have a sensitivity to the preservative Thimersol? no    Do you have a past history of Guillan-Barre Syndrome? no    Do you currently have an acute febrile illness? no    Have you ever had a severe reaction to latex? no    Vaccine information given and explained to patient? yes    Are you currently pregnant? no    Lot Number:[id number]   Exp Date:05/06/2011   Site Given  Left Deltoid IMflu  Zackery Barefoot CMA   August 03, 2010 3:05 PM

## 2010-12-06 NOTE — Letter (Signed)
Summary: [facility]  [facility]   Imported By: Sherian Rein 11/24/2009 11:33:32  _____________________________________________________________________  External Attachment:    Type:   Image     Comment:   External Document

## 2010-12-08 NOTE — Assessment & Plan Note (Signed)
Summary: 2 MO ROV /NWS  #   Vital Signs:  Patient profile:   65 year old male Height:      68 inches Weight:      200 pounds BMI:     30.52 O2 Sat:      98 % on Room air Temp:     97.9 degrees F oral Pulse rate:   64 / minute BP sitting:   120 / 82  (left arm) Cuff size:   large  Vitals Entered By: Zella Ball Ewing CMA Duncan Dull) (November 03, 2010 2:46 PM)  O2 Flow:  Room air  Preventive Care Screening     declines colonoscopy  CC: 2 month ROV/RE   Primary Care Provider:  Corwin Levins MD  CC:  2 month ROV/RE.  History of Present Illness: here for wellness, overll doing ok;;  Pt denies CP, worsening sob, doe, wheezing, orthopnea, pnd, worsening LE edema, palps, dizziness or syncope  Pt denies new neuro symptoms such as headache, facial weakness.  Pt denies polydipsia, polyuria, or low sugar symptoms.  Overall good compliance with meds, trying to follow low chol diet but not as much recetnly, wt stable.  Denies worsening depressive symptoms, suicidal ideation, or panic.   No fever, wt loss, night sweats, loss of appetite or other constitutional symptoms  Overall good compliance with meds, and good tolerability.  Pt states good ability with ADL's, low fall risk, home safety reviewed and adequate, no significant change in hearing or vision.  No new complaints.    Preventive Screening-Counseling & Management      Drug Use:  no.    Problems Prior to Update: 1)  Insomnia-sleep Disorder-unspec  (ICD-780.52) 2)  Back Pain  (ICD-724.5) 3)  Uti  (ICD-599.0) 4)  Spastic Paralysis  (ICD-344.9) 5)  Paraplegia  (ICD-344.1) 6)  Uri  (ICD-465.9) 7)  Abnormality of Gait  (ICD-781.2) 8)  Preventive Health Care  (ICD-V70.0) 9)  Abrasion, Leg, Infected  (ICD-916.9) 10)  Hypogonadism  (ICD-257.2) 11)  Family History Diabetes 1st Degree Relative  (ICD-V18.0) 12)  Low Back Pain  (ICD-724.2) 13)  Gout  (ICD-274.9) 14)  Irritable Bowel Syndrome, Hx of  (ICD-V12.79) 15)  Peptic Ulcer Disease   (ICD-533.90) 16)  Allergic Rhinitis  (ICD-477.9) 17)  Depression  (ICD-311) 18)  Anxiety  (ICD-300.00) 19)  Preventive Health Care  (ICD-V70.0) 20)  Erectile Dysfunction  (ICD-302.72) 21)  Fatigue  (ICD-780.79) 22)  Obstructive Sleep Apnea  (ICD-327.23) 23)  Gout Nos  (ICD-274.9) 24)  Gerd  (ICD-530.81) 25)  Prostate Cancer, Hx of  (ICD-V10.46) 26)  Hypertension  (ICD-401.9) 27)  Carpal Tunnel Syndrome, Right  (ICD-354.0)  Medications Prior to Update: 1)  Losartan Potassium 50 Mg Tabs (Losartan Potassium) .Marland Kitchen.. 1po Once Daily 2)  Ecotrin Low Strength 81 Mg  Tbec (Aspirin) .Marland Kitchen.. 1 Tablet By Mouth Two Times A Day 3)  Etodolac 400 Mg Tabs (Etodolac) .Marland Kitchen.. 1 By Mouth Two Times A Day As Needed 4)  Senokot 8.6 Mg Tabs (Sennosides) .... 2 At Bedtime 5)  Laxative Supp .... Use 1 Supp Every Morning As Needed 6)  Baclofen 10 Mg Tabs (Baclofen) .... Take 1 Tab By Mouth Every 6 Hours 7)  Oxybutynin Chloride 5 Mg Tabs (Oxybutynin Chloride) .... Take 1 Tablet By Mouth Four Times A Day 8)  Tizanidine Hcl 4 Mg Tabs (Tizanidine Hcl) .... 2 Tab By Mouth Every 6 Hours 9)  Clonazepam 2 Mg Tabs (Clonazepam) .... 1/2 By Mouth Qam, and 1 By Mouth  Qhs 10)  Vitamin E 400 Unit Caps (Vitamin E) .... 2 By Mouth Once Daily  Current Medications (verified): 1)  Losartan Potassium 50 Mg Tabs (Losartan Potassium) .Marland Kitchen.. 1po Once Daily 2)  Ecotrin Low Strength 81 Mg  Tbec (Aspirin) .Marland Kitchen.. 1 Tablet By Mouth Once Daily 3)  Etodolac 400 Mg Tabs (Etodolac) .Marland Kitchen.. 1 By Mouth Two Times A Day As Needed 4)  Senokot 8.6 Mg Tabs (Sennosides) .... 2 At Bedtime 5)  Laxative Supp .... Use 1 Supp Every Morning As Needed 6)  Baclofen 10 Mg Tabs (Baclofen) .... Take 1 Tab By Mouth Every 6 Hours 7)  Oxybutynin Chloride 5 Mg Tabs (Oxybutynin Chloride) .... Take 1 Tablet By Mouth Four Times A Day 8)  Tizanidine Hcl 4 Mg Tabs (Tizanidine Hcl) .... 2 Tab By Mouth Every 6 Hours 9)  Clonazepam 2 Mg Tabs (Clonazepam) .... 1/2 By Mouth Qam, and 1  By Mouth Qhs 10)  Vitamin E 400 Unit Caps (Vitamin E) .... 2 By Mouth Once Daily  Allergies (verified): 1)  ! Amoxicillin 2)  ! * Endal Hd  Past History:  Past Medical History: Last updated: 02/20/2008 FAMILY HISTORY DIABETES 1ST DEGREE RELATIVE (ICD-V18.0) LOW BACK PAIN (ICD-724.2) GOUT (ICD-274.9) IRRITABLE BOWEL SYNDROME, HX OF (ICD-V12.79) PEPTIC ULCER DISEASE (ICD-533.90) ALLERGIC RHINITIS (ICD-477.9) DEPRESSION (ICD-311) ANXIETY (ICD-300.00) PREVENTIVE HEALTH CARE (ICD-V70.0) ERECTILE DYSFUNCTION (ICD-302.72) FATIGUE (ICD-780.79) OBSTRUCTIVE SLEEP APNEA (ICD-327.23) GOUT NOS (ICD-274.9) GERD (ICD-530.81) PROSTATE CANCER, HX OF (ICD-V10.46) HYPERTENSION (ICD-401.9) CARPAL TUNNEL SYNDROME, RIGHT (ICD-354.0)  Past Surgical History: Last updated: 03/31/2008 Prostatectomy Inguinal herniorrhaphy Tonsillectomy s/p pilonidal cyst s/p left leg surgury after fibula fx  Family History: Last updated: 10/01/2007 Family History Diabetes 1st degree relative  Social History: Last updated: 11/03/2010 Never Smoked Alcohol use-yes Married no children retired - former Data processing manager for [employer] Drug use-no  Risk Factors: Smoking Status: never (10/01/2007)  Social History: Never Smoked Alcohol use-yes Married no children retired - former Data processing manager for [employer] Drug use-no Drug Use:  no  Review of Systems  The patient denies anorexia, fever, vision loss, decreased hearing, hoarseness, chest pain, syncope, dyspnea on exertion, peripheral edema, prolonged cough, headaches, hemoptysis, abdominal pain, melena, hematochezia, severe indigestion/heartburn, hematuria, suspicious skin lesions, transient blindness, depression, unusual weight change, abnormal bleeding, enlarged lymph nodes, and angioedema.         all otherwise negative per pt -  does have some trouble with getting to sleep recently, wants to avoid more rx  meds  Physical Exam  General:  alert and overweight-appearing. , well appearing, bright , alert , not confused Head:  normocephalic and atraumatic.   Eyes:  vision grossly intact, pupils equal, and pupils round.   Ears:  R ear normal and L ear normal.   Nose:  no external deformity and no nasal discharge.   Mouth:  no gingival abnormalities and pharynx pink and moist.   Neck:  supple and no masses.   Lungs:  normal respiratory effort and normal breath sounds.   Heart:  normal rate and regular rhythm.   Abdomen:  soft, non-tender, and normal bowel sounds.   Msk:  spine nontender, no swelling Extremities:  no edema, no erythema  Neurologic:  paraplegia, no change, alert & oriented X3 and cranial nerves II-XII intact.   Skin:  color normal and no rashes.   Psych:  not depressed appearing and slightly anxious.     Impression & Recommendations:  Problem # 1:  Preventive Health Care (  ICD-V70.0) Overall doing well, age appropriate education and counseling updated, referral for preventive services and immunizations addressed, dietary counseling and smoking status adressed , most recent labs reviewed  I have personally reviewed and have noted 1.The patient's medical and social history 2.Their use of alcohol, tobacco or illicit drugs 3.Their current medications and supplements 4. Functional ability including ADL's, fall risk, home safety risk, hearing & visual impairment  5.Diet and physical activities 6.Evidence for depression or mood disorders The patients weight, height, BMI  have been recorded in the chart I have made referrals, counseling and provided education to the patient based review of the above  Orders: TLB-BMP (Basic Metabolic Panel-BMET) (80048-METABOL) TLB-CBC Platelet - w/Differential (85025-CBCD) TLB-Hepatic/Liver Function Pnl (80076-HEPATIC) TLB-Lipid Panel (80061-LIPID) TLB-TSH (Thyroid Stimulating Hormone) (84443-TSH) TLB-PSA (Prostate Specific Antigen)  (84153-PSA)  Problem # 2:  INSOMNIA-SLEEP DISORDER-UNSPEC (ICD-780.52) for tylenol PM as needed, consider ambien 10 mg if not improved  Complete Medication List: 1)  Losartan Potassium 50 Mg Tabs (Losartan potassium) .Marland Kitchen.. 1po once daily 2)  Ecotrin Low Strength 81 Mg Tbec (Aspirin) .Marland Kitchen.. 1 tablet by mouth once daily 3)  Etodolac 400 Mg Tabs (Etodolac) .Marland Kitchen.. 1 by mouth two times a day as needed 4)  Senokot 8.6 Mg Tabs (Sennosides) .... 2 at bedtime 5)  Laxative Supp  .... Use 1 supp every morning as needed 6)  Baclofen 10 Mg Tabs (Baclofen) .... Take 1 tab by mouth every 6 hours 7)  Oxybutynin Chloride 5 Mg Tabs (Oxybutynin chloride) .... Take 1 tablet by mouth four times a day 8)  Tizanidine Hcl 4 Mg Tabs (Tizanidine hcl) .... 2 tab by mouth every 6 hours 9)  Clonazepam 2 Mg Tabs (Clonazepam) .... 1/2 by mouth qam, and 1 by mouth qhs 10)  Vitamin E 400 Unit Caps (Vitamin e) .... 2 by mouth once daily  Patient Instructions: 1)  Continue all previous medications as before this visit 2)  Please go to the Lab in the basement for your blood and/or urine tests today 3)  Please call the number on the [facility] for results of your testing  4)  Please schedule a follow-up appointment in 1 year, or sooner if needed   Orders Added: 1)  TLB-BMP (Basic Metabolic Panel-BMET) [80048-METABOL] 2)  TLB-CBC Platelet - w/Differential [85025-CBCD] 3)  TLB-Hepatic/Liver Function Pnl [80076-HEPATIC] 4)  TLB-Lipid Panel [80061-LIPID] 5)  TLB-TSH (Thyroid Stimulating Hormone) [84443-TSH] 6)  TLB-PSA (Prostate Specific Antigen) [84153-PSA] 7)  Est. Patient 40-64 years [19147]

## 2011-01-18 LAB — CBC
HCT: 44.1 % (ref 39.0–52.0)
Hemoglobin: 14.8 g/dL (ref 13.0–17.0)
Hemoglobin: 15.5 g/dL (ref 13.0–17.0)
MCV: 89.1 fL (ref 78.0–100.0)
Platelets: 151 10*3/uL (ref 150–400)
Platelets: 157 10*3/uL (ref 150–400)
RBC: 4.94 MIL/uL (ref 4.22–5.81)
RBC: 4.95 MIL/uL (ref 4.22–5.81)
RBC: 5.22 MIL/uL (ref 4.22–5.81)
WBC: 12.1 10*3/uL — ABNORMAL HIGH (ref 4.0–10.5)
WBC: 12.4 10*3/uL — ABNORMAL HIGH (ref 4.0–10.5)
WBC: 8.8 10*3/uL (ref 4.0–10.5)

## 2011-01-18 LAB — URINE CULTURE

## 2011-01-18 LAB — COMPREHENSIVE METABOLIC PANEL
ALT: 14 U/L (ref 0–53)
AST: 23 U/L (ref 0–37)
Alkaline Phosphatase: 51 U/L (ref 39–117)
CO2: 24 mEq/L (ref 19–32)
Chloride: 105 mEq/L (ref 96–112)
GFR calc non Af Amer: 57 mL/min — ABNORMAL LOW (ref >60)
Glucose, Bld: 152 mg/dL — ABNORMAL HIGH (ref 70–99)
Sodium: 137 mEq/L (ref 135–145)
Total Bilirubin: 0.8 mg/dL (ref 0.3–1.2)

## 2011-01-18 LAB — CULTURE, BLOOD (ROUTINE X 2): Culture  Setup Time: 201110210301

## 2011-01-18 LAB — DIFFERENTIAL
Basophils Absolute: 0 10*3/uL (ref 0.0–0.1)
Basophils Relative: 0 % (ref 0–1)
Eosinophils Absolute: 0 10*3/uL (ref 0.0–0.7)
Eosinophils Relative: 0 % (ref 0–5)
Monocytes Absolute: 1.2 10*3/uL — ABNORMAL HIGH (ref 0.1–1.0)

## 2011-01-18 LAB — BASIC METABOLIC PANEL
Calcium: 8.1 mg/dL — ABNORMAL LOW (ref 8.4–10.5)
Chloride: 114 mEq/L — ABNORMAL HIGH (ref 96–112)
Creatinine, Ser: 0.93 mg/dL (ref 0.4–1.5)
GFR calc Af Amer: >60 mL/min (ref >60)
GFR calc Af Amer: >60 mL/min (ref >60)
GFR calc non Af Amer: >60 mL/min (ref >60)
Potassium: 4.1 mEq/L (ref 3.5–5.1)
Sodium: 139 mEq/L (ref 135–145)
Sodium: 141 mEq/L (ref 135–145)

## 2011-01-18 LAB — URINALYSIS, ROUTINE W REFLEX MICROSCOPIC
Nitrite: NEGATIVE
Specific Gravity, Urine: 1.024 (ref 1.005–1.030)
Urobilinogen, UA: 1 mg/dL (ref 0.0–1.0)
pH: 6 (ref 5.0–8.0)

## 2011-01-18 LAB — LIPASE, BLOOD: Lipase: 21 U/L (ref 11–59)

## 2011-01-18 LAB — URINE MICROSCOPIC-ADD ON

## 2011-01-18 LAB — POCT CARDIAC MARKERS
CKMB, poc: <1 ng/mL — ABNORMAL LOW (ref 1.0–8.0)
Troponin i, poc: <0.05 ng/mL (ref 0.00–0.09)

## 2011-01-18 LAB — MAGNESIUM: Magnesium: 2 mg/dL (ref 1.5–2.5)

## 2011-02-14 LAB — POCT I-STAT, CHEM 8
BUN: 15 mg/dL (ref 6–23)
Calcium, Ion: 1.07 mmol/L — ABNORMAL LOW (ref 1.12–1.32)
Chloride: 104 mEq/L (ref 96–112)
Creatinine, Ser: 1 mg/dL (ref 0.4–1.5)
Glucose, Bld: 96 mg/dL (ref 70–99)

## 2011-02-14 LAB — URINALYSIS, ROUTINE W REFLEX MICROSCOPIC
Bilirubin Urine: NEGATIVE
Glucose, UA: NEGATIVE mg/dL
Hgb urine dipstick: NEGATIVE
Ketones, ur: NEGATIVE mg/dL
pH: 7 (ref 5.0–8.0)

## 2011-02-14 LAB — DIFFERENTIAL
Basophils Absolute: 0 10*3/uL (ref 0.0–0.1)
Eosinophils Relative: 0 % (ref 0–5)
Lymphocytes Relative: 9 % — ABNORMAL LOW (ref 12–46)
Monocytes Absolute: 0.5 10*3/uL (ref 0.1–1.0)

## 2011-02-14 LAB — CBC
HCT: 40.3 % (ref 39.0–52.0)
Hemoglobin: 13.6 g/dL (ref 13.0–17.0)
RDW: 14.1 % (ref 11.5–15.5)

## 2011-02-15 LAB — DIFFERENTIAL
Basophils Absolute: 0 10*3/uL (ref 0.0–0.1)
Basophils Relative: 0 % (ref 0–1)
Lymphocytes Relative: 4 % — ABNORMAL LOW (ref 12–46)
Monocytes Relative: 5 % (ref 3–12)
Neutro Abs: 17.7 10*3/uL — ABNORMAL HIGH (ref 1.7–7.7)
Neutrophils Relative %: 91 % — ABNORMAL HIGH (ref 43–77)

## 2011-02-15 LAB — URINALYSIS, ROUTINE W REFLEX MICROSCOPIC
Glucose, UA: NEGATIVE mg/dL
Nitrite: POSITIVE — AB
Protein, ur: NEGATIVE mg/dL

## 2011-02-15 LAB — CBC
HCT: 35 % — ABNORMAL LOW (ref 39.0–52.0)
HCT: 35.2 % — ABNORMAL LOW (ref 39.0–52.0)
HCT: 35.3 % — ABNORMAL LOW (ref 39.0–52.0)
HCT: 35.3 % — ABNORMAL LOW (ref 39.0–52.0)
Hemoglobin: 12 g/dL — ABNORMAL LOW (ref 13.0–17.0)
Hemoglobin: 12.1 g/dL — ABNORMAL LOW (ref 13.0–17.0)
Hemoglobin: 12.2 g/dL — ABNORMAL LOW (ref 13.0–17.0)
Hemoglobin: 12.8 g/dL — ABNORMAL LOW (ref 13.0–17.0)
Hemoglobin: 16.2 g/dL (ref 13.0–17.0)
MCHC: 34 g/dL (ref 30.0–36.0)
MCHC: 33.5 g/dL (ref 30.0–36.0)
MCV: 81.6 fL (ref 78.0–100.0)
MCV: 89.4 fL (ref 78.0–100.0)
MCV: 90.3 fL (ref 78.0–100.0)
MCV: 91.6 fL (ref 78.0–100.0)
Platelets: 192 10*3/uL (ref 150–400)
Platelets: 210 10*3/uL (ref 150–400)
Platelets: 244 10*3/uL (ref 150–400)
Platelets: 246 10*3/uL (ref 150–400)
Platelets: 353 10*3/uL (ref 150–400)
RBC: 3.88 MIL/uL — ABNORMAL LOW (ref 4.22–5.81)
RBC: 4.03 MIL/uL — ABNORMAL LOW (ref 4.22–5.81)
RBC: 5.35 MIL/uL (ref 4.22–5.81)
RDW: 13 % (ref 11.5–15.5)
RDW: 13.4 % (ref 11.5–15.5)
RDW: 13.6 % (ref 11.5–15.5)
RDW: 13.8 % (ref 11.5–15.5)
WBC: 10.3 10*3/uL (ref 4.0–10.5)
WBC: 17.8 10*3/uL — ABNORMAL HIGH (ref 4.0–10.5)
WBC: 8.1 10*3/uL (ref 4.0–10.5)
WBC: 7.3 10*3/uL (ref 4.0–10.5)

## 2011-02-15 LAB — BLOOD GAS, ARTERIAL
Bicarbonate: 21.5 mEq/L (ref 20.0–24.0)
Bicarbonate: 22.1 mEq/L (ref 20.0–24.0)
FIO2: 0.5 %
MECHVT: 600 mL
O2 Saturation: 99.1 %
O2 Saturation: 99.2 %
PEEP: 5 cmH2O
Patient temperature: 98.6
Patient temperature: 99
TCO2: 22.6 mmol/L (ref 0–100)
TCO2: 23.2 mmol/L (ref 0–100)
pH, Arterial: 7.419 (ref 7.350–7.450)
pO2, Arterial: 154 mmHg — ABNORMAL HIGH (ref 80.0–100.0)

## 2011-02-15 LAB — COMPREHENSIVE METABOLIC PANEL
Albumin: 2.4 g/dL — ABNORMAL LOW (ref 3.5–5.2)
BUN: 26 mg/dL — ABNORMAL HIGH (ref 6–23)
Creatinine, Ser: 0.77 mg/dL (ref 0.4–1.5)
Total Bilirubin: 0.4 mg/dL (ref 0.3–1.2)
Total Protein: 4.7 g/dL — ABNORMAL LOW (ref 6.0–8.3)

## 2011-02-15 LAB — POCT I-STAT 4, (NA,K, GLUC, HGB,HCT)
Glucose, Bld: 101 mg/dL — ABNORMAL HIGH (ref 70–99)
HCT: 32 % — ABNORMAL LOW (ref 39.0–52.0)
Hemoglobin: 10.9 g/dL — ABNORMAL LOW (ref 13.0–17.0)
Potassium: 4.1 mEq/L (ref 3.5–5.1)
Sodium: 139 mEq/L (ref 135–145)

## 2011-02-15 LAB — BASIC METABOLIC PANEL
BUN: 28 mg/dL — ABNORMAL HIGH (ref 6–23)
Calcium: 7.9 mg/dL — ABNORMAL LOW (ref 8.4–10.5)
Chloride: 106 mEq/L (ref 96–112)
GFR calc non Af Amer: >60 mL/min (ref >60)
GFR calc non Af Amer: >60 mL/min (ref >60)
Glucose, Bld: 109 mg/dL — ABNORMAL HIGH (ref 70–99)
Potassium: 3.8 mEq/L (ref 3.5–5.1)
Potassium: 4 mEq/L (ref 3.5–5.1)
Sodium: 131 mEq/L — ABNORMAL LOW (ref 135–145)
Sodium: 140 mEq/L (ref 135–145)

## 2011-02-15 LAB — URINE CULTURE: Colony Count: >=100000

## 2011-02-15 LAB — URINE MICROSCOPIC-ADD ON

## 2011-02-15 LAB — ABO/RH: ABO/RH(D): O POS

## 2011-02-15 LAB — TYPE AND SCREEN: Antibody Screen: NEGATIVE

## 2011-03-15 ENCOUNTER — Encounter
Payer: Federal, State, Local not specified - PPO | Attending: Physical Medicine & Rehabilitation | Admitting: Physical Medicine & Rehabilitation

## 2011-03-15 DIAGNOSIS — G808 Other cerebral palsy: Secondary | ICD-10-CM

## 2011-03-15 DIAGNOSIS — C729 Malignant neoplasm of central nervous system, unspecified: Secondary | ICD-10-CM

## 2011-03-15 DIAGNOSIS — G8220 Paraplegia, unspecified: Secondary | ICD-10-CM | POA: Insufficient documentation

## 2011-03-15 DIAGNOSIS — S24103A Unspecified injury at T7-T10 level of thoracic spinal cord, initial encounter: Secondary | ICD-10-CM | POA: Insufficient documentation

## 2011-03-15 DIAGNOSIS — I10 Essential (primary) hypertension: Secondary | ICD-10-CM | POA: Insufficient documentation

## 2011-03-15 DIAGNOSIS — C719 Malignant neoplasm of brain, unspecified: Secondary | ICD-10-CM | POA: Insufficient documentation

## 2011-03-15 DIAGNOSIS — X58XXXA Exposure to other specified factors, initial encounter: Secondary | ICD-10-CM | POA: Insufficient documentation

## 2011-03-15 DIAGNOSIS — F329 Major depressive disorder, single episode, unspecified: Secondary | ICD-10-CM

## 2011-03-16 NOTE — Assessment & Plan Note (Signed)
David Kim is back regarding spastic paraparesis.  He has been doing fairly well since I last saw him.  His spasticity has been under the control. He and his wife manages range of motion and stay with that on regular basis.  He is purchasing a standing frame and tends to use that on daily basis and apparently it is mobile and he can try to move about the house somewhat.  His pain is 0 today.  He is on a daily bowel program.  He has not had any recent urinary tract infections.  Full 12-point review is in the written health and history section of the chart.  He did have one spell of diarrhea.  SOCIAL HISTORY:  The patient is married, living with his wife who is supportive.  PHYSICAL EXAMINATION:  VITAL SIGNS:  Blood pressure 111/66, pulse 71, respiratory rate 18, he is satting 97% on room air. GENERAL:  The patient is pleasant, alert, and oriented x3. MUSCULOSKELETAL:  He has hyperreflexia in both lower extremities, although range of motion is much more easy and his resting tone is minimal.  He has 0/5 strength in either leg and 0/2 sensation.  Right ankle is a bit tight and he is able to achieve only -5 of ankle dorsiflexion.  Posture was good.  He had good sitting balance.  Upper extremity strength appears to have increased.  ASSESSMENT: 1. 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 2. Hypertension.  PLAN: 1. The patient is doing very well with his spasticity management.  We     will continue with his baclofen, tizanidine, and Klonopin as     previously dosed. 2. We discussed maintenance exercises arranged etc.  He will followup     with [facility] regarding his bladder. 3. I encouraged him to utilize his standing frame.  He should start 5-     10 minutes and buildup from there per session as tolerated. 4. I will see the patient back in about one year's time.  He did     mention a new wheelchair, which patient wants, looks to be     appropriate at this  point.  The rest of his wheelchair was intact.     Ranelle Oyster, M.D. Electronically Signed    ZTS/MedQ D:  03/15/2011 12:14:02  T:  03/16/2011 00:37:39  Job #:  [id number]  cc:   Levert Feinstein, MD  Hilda Lias, M.D. Fax: [phone]

## 2011-03-21 NOTE — Op Note (Signed)
NAME:  David Kim, David Kim                ACCOUNT NO.:  [id number]   MEDICAL RECORD NO.:  [id number]          PATIENT TYPE:  AMB   LOCATION:  [facility]                          FACILITY:  [facility]   PHYSICIAN:  Harvie Junior, M.D.   DATE OF BIRTH:  [birth year]   DATE OF PROCEDURE:  12/13/2007  DATE OF DISCHARGE:  12/13/2007                               OPERATIVE REPORT   PREOPERATIVE DIAGNOSIS:  Lateral side fracture with medial malleolar  avulsion and widening of the mortise.   POSTOPERATIVE DIAGNOSIS:  Lateral side fracture with medial malleolar  avulsion and widening of the mortise.   PROCEDURE:  Open reduction, internal fixation of lateral malleolus  fracture with a correction of the mortise and the avulsion fracture  medially.   SURGEON:  Jodi Geralds, M.D.   ASSISTANT:  Marshia Ly, P.A.   ANESTHESIA:  General.   BRIEF HISTORY:  David Kim is a 65 year old male with a long history of  having had a twisting injury to his ankle.  He was evaluated in the  office and noted to have a fracture, really a relatively high fracture  of the fibula, Weber C style fracture.  We had followed him with casting  initially, looked as though the fracture was reduced, but ultimately it  appeared as though after a week in the cast that the fracture was  already slipping and the medial malleolus, certainly the medial clear  space widened dramatically.  Because of this we felt that open reduction  and internal fixation was the only reasonable course of action.  He was  brought to the operating for this procedure.   PROCEDURE IN DETAIL:  The patient was brought to the operating room.  After adequate anesthesia obtained with general anesthesia, the patient  was placed on the operating table.  The left leg was prepped and draped  in usual sterile fashion.  Following this, a curved incision was made  over the lateral fibula and subcutaneous tissue, taken down to the level  of the fascia over the fibula.  The  fibula was clearly expose.  The  fracture was then identified, cleared of all clinging elements and then  held in an anatomically reduced position with a lockjaw clamp.  Fluoroscopy was then used at that point to show that the medial clear  space had narrowed down and the avulsion fracture was right where it  needed to be and at this point, while the fracture was being held,  anatomically reduced interfragmentary fixation was achieved and then six-  hole one-third tubular plate was used to achieve neutralization.  Anatomic reduction of fibula was achieved and the fluoroscopic imaging  was used to ensure adequate screw length as well as though that the  medial clear space had narrowed down.  The avulsed fracture fragment had  reduced in its appropriate position.   At this point the wound was copiously and thoroughly irrigated,  suctioned dry.  The skin was closed combination of 0 and 2-0 Vicryl and  skin staples.  Sterile compressive dressing was applied as well as a U  and posterior  splint.  The patient was taken recovery room, noted to be  in satisfactory condition.  Estimated blood loss for this procedure was  25 mL.      Harvie Junior, M.D.  Electronically Signed     JLG/MEDQ  D:  12/13/2007  T:  12/15/2007  Job:  [id number]

## 2011-03-21 NOTE — Discharge Summary (Signed)
NAME:  David Kim, David Kim                ACCOUNT NO.:  [id number]   MEDICAL RECORD NO.:  [id number]          PATIENT TYPE:  IPS   LOCATION:  4010                         FACILITY:  [facility]   PHYSICIAN:  Ranelle Oyster, M.D.DATE OF BIRTH:  [birth year]   DATE OF ADMISSION:  02/18/2009  DATE OF DISCHARGE:  03/05/2009                               DISCHARGE SUMMARY   DISCHARGE DIAGNOSES:  1. 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  2. Escherichia coli urinary tract infection, treated.  3. Hypertension with orthostasis, resolved.  4. Obstructive sleep apnea.   HISTORY OF PRESENT ILLNESS:  David Kim is a 65 year old male with  history of prostate cancer, gait disorder with fall, and hyperreflexia  with workup revealing T9-T10 thoracic tumor.  The patient admitted on  February 12, 2009, for T8-T9 limb with resection of thoracic ependymoma by  Dr. Jeral Fruit.  He was noted to be paraplegic in PACU and taken back to OR  to explore wound and evacuate hematoma.  Therapies initiated in past  surgery and the patient is currently noted to be mod assist to maintain  balance at the edge of the with bilateral upper extremity support.  Noted to have issues with bilateral lower extremity spasms as well as  incontinence of bowel.  Rehab was consulted for progressive therapies.   PAST MEDICAL HISTORY:  Positive for prostate cancer, status post radical  prostatectomy, history of ED, hypertension, balance deficits since 2008,  left lateral malleolus fracture with ORIF, gout, plantar fascitis  surgery, duodenal ulcer, bilateral inguinal hernia repair, depression,  anxiety, allergic rhinitis, and lumbar spondylosis.   ALLERGIES:  AMOXICILLIN and ENDAL HD.   FAMILY HISTORY:  Positive for diabetes and coronary artery disease.   SOCIAL HISTORY:  The patient is married, lives in 1-level home with 4  steps at entry and ramp in the back.  Retired from [employer].  Does  not use any tobacco.  Has an  occasional wine cooler in terms of alcohol.  Wife is very supportive and can provide supervision past discharge.   FUNCTIONAL HISTORY:  The patient was independent with cane prior to  admission.  Still drives.   PHYSICAL EXAMINATION:  GENERAL:  Obese male, in no acute distress.  HEENT:  Atraumatic and normocephalic with extraocular movements intact.  Nares patent.  Moist oral mucosa.  Hearing normal.  NECK:  Supple without JVD or lymphadenopathy.  LUNGS:  Clear to auscultation bilaterally without wheezes or rales.  HEART:  Regular rate and rhythm without murmurs or gallops.  SKIN:  Lumbar incision is noted to be healing well and multiple staples  in place in mid thoracic area.  No erythema or fluctuance noted.  EXTREMITIES:  No evidence of edema in lower extremity.  Upper extremity  strength is 5/5 bilateral deltoid, biceps, and triceps.  Lower extremity  strength is 0-5 at hip, quad, T and gastroc.  Tone is reduced bilateral  lower extremity.  He had 0 muscle stretch reflexes in lower extremity,  1+ in upper.  His range of motion is full in bilateral lower extremity.  NEUROLOGIC:  Alert  and oriented x3.  Speech clear.  Normal range of  motion upper extremity.  PSYCHIATRIC:  Mood, memory, and affect are all intact.   HOSPITAL COURSE:  David Kim was admitted to Rehab on February 18, 2009, for inpatient therapies to consist of PT and OT at least 3 hours 5  days a week.  Past-admission, physiatrist, rehab RN, and therapy team  have worked together to provide customized collaborative  interdisciplinary care during this patient's stay in Rehab.  Rehab RN  has been working aggressively with the patient and wife regarding bowel  and bladder training as well as monitoring of skin care and wound care.  The patient with history of incontinence of bladder prior to admission.  He was noted to have a Foley in place at the time of admission and this  was kept in place until mobility improved.   The patient also with  reports of incontinence of bowel.  A KUB was done past admission showing  moderate amount of colonic stool, no evidence of dilated loops.  He was  started on bowel program with Senokot-S 2 p.o. nightly with suppository  as well at this time in a.m. to help with evacuation.  Once bowel  program was established, the patient was noted to have positive results  without any incontinence episode.  The patient was started on subcu  Lovenox for DVT prophylaxis on February 19, 2009.  At the time of  discharge, the patient to continue on subcu Lovenox for 12 total weeks  of anticoagulation therapy for prevention of DVTs and PEs.  Labs done  past admission revealed the patient with leukocytosis with white count  of 19.5.  H and H were noted to be at 12.5 and 35.2.  Platelets at 210.  Check of lytes revealed sodium 136, potassium 3.9, chloride 104, CO2 of  23, BUN 26, and creatinine at 0.77.  UA/UC was done past admission and  the patient was noted to have E. coli UTI and was treated with a 7-day  course of Cipro for this.  Once mobility was initiated, the patient was  noted to have issues with orthostasis.  An abdominal binder as well as  TEDs were ordered to help with his symptomatology.  The patient's blood  pressures have been monitored on a b.i.d. basis.  These noted to be  ranging low from 100s-110s systolics and 50s-60s diastolic.  The  patient's Benicar has been decreased to 10 mg p.o. per day.  The patient  has had issues with spasticity of lower extremities.  He was started on  low-dose baclofen and dose has been titrated to 10 mg p.o. t.i.d.  without any adverse effects.  The patient was initially maintained on  Decadron.  Decadron is slowly to be tapered off over the next week.  The  patient's Foley was discontinued on February 25, 2009.  Voiding program was  initiated.  The patient was noted to have issues with urinary retention  and did have 1-2 episodes of small amount  of urine initially.  The  patient was started on in-and-out cath schedule q.8 hours.  The patient  continues to have incontinence due to being insensate as well as due to  spasticity.  He was started on Ditropan 5 mg b.i.d., titrated 10 mg  b.i.d. to paralyze bladder to prevent incontinence.  In-and-out cath to  continue q.8 hours to keep bladder decompress.   The patient's lytes have been checked during this stay to monitor  for  his hydration status.  Rehab RN has been working with the patient on  pushing fluids to maintain his hydration to help decrease his  orthostasis symptoms.  Last check of labs on March 03, 2009, reveals  much improvement with sodium 131, potassium 3.8, chloride 98, CO2 of 25,  BUN 28, creatinine 0.8, and glucose 105.  Check of CBC reveals acute  blood loss anemia to be stable with H&H at 12.0 and 35.  Leukocytosis  resolved with white count at 10, platelets stable at 192.  Wound has  been monitored closely.  The staples were discontinued and area Steri-  Stripped with no evidence of dehiscence.  On March 01, 2009, the patient  was noted to have fluctuance under his thoracic incision, question of  seroma versus hematoma.  His H&H has been stable.  Dr. Jeral Fruit was  consulted for inputs.  He feels that the patient without any  neurological symptoms, no treatment currently needed, wound without any  change, and the patient to follow up with him in 6-8 weeks past  discharge.  At the time of admission, PT eval revealed the patient at  total assist to mod assist with bed mobility training eval.  He was at  max assist for transfer from bed to wheelchair.  Mod assist with sliding  board training.  PT has been working with the patient on pressure relief  education.  They have also been working with the patient on core  strengthening to help improve his balance at the edge of the bed.  PT  has also been working with the patient on truncal control and dynamic  sitting balance  training at the edge of mat with ball tossing as well as  turning to the left and right in various directions to help maintain  balance.  He has been working with bilateral upper extremity range of  motion, endurance as well as strengthening exercises with 5 pounds'  weight.  Bilateral lower extremity range of motion and heel cord and  hamstring stretching has also been ongoing with wife being instructed  regarding importance of passive range of motion for strengthening as  well as pressure relief measures.  A custom wheelchair was ordered with  the patient and wife being instructed regarding different parts as well  as on set up as well as breakdown of chair.  Currently, the patient is  modified independent for navigating wheelchair greater than 150 feet.  He is able to perform setup parts, management, and transfers from  wheelchair to mat with supervision.  Bilateral leg loops were made for  the patient to help with handling of his lower extremities to help  improve his bed mobility.  Currently, the patient is at min assist for  bed mobility with leg loops with the patient and wife being educated  about positioning of the lower extremities for mobility.  The patient is  min assist with setup for sliding board transfers.  Wife has been  educated regarding car transfers as the patient requires mod assist to  lift lower extremity in and out of car.  The patient is currently able  to navigate ramps with supervision.  Family education was done by PT and  OT to check on the patient's ability to get around home as well as  ability to transfers in and out of his rocking recliner as well as his  computer chair.  The patient was able to perform transfers with min  assist.  Challenges were  identified and the patient and family was  instructed regarding getting bathroom doors off hinges.  They have also  instructed regarding holding his stay with chairs to support for  transfers.  Pneumatic wheel  ordered to help the patient propel his  wheelchair more easily over a rug at home.  Use of padded bench with  cutout or toilet and tub bench and shower to connect surface for easier  shower transfers were done.  It was also recommended that the patient  get a handheld shower head as the patient would be sitting in the shower  backwards to help ease off his transfers and this would also to ease his  ADL tasks.  OT eval revealed the patient at total assist for lower body  care.  He was limited by orthostasis as well as decreased balance.  OT  has been working with the patient on balance for upper body bathing as  well as dressing tasks.  They have also been working with lateral leans  to help with lower body dressing as well as toileting and for peri care.  The patient and family have been educated regarding bilateral upper  extremity strengthening and stretching exercises as well as safety and  precautions for truncal mobility as well as pressure relief measures  when in chair.  The patient is currently at supervision for grooming.  Min assist for bathing and dressing.  He is showing improvement in his  ADLs as well as sitting balance.  Rehab Nursing has been working with  the patient and wife regarding in-and-out cath education.  The patient  and wife are independent for cathing.  The patient did have some issues  with hemorrhoidal flare initially with dig stim.  Anusol-HC  suppositories were added to help with his symptomatology.  Currently,  bleeding and irritation due to hemorrhoids almost resolved.  The patient  is unable to do dig stim due to being insensate in the area.  The  patient will have assistance by wife who will provide dig stim to help  with evacuation of bowels.  The patient will continue to have additional  followup home health PT, OT by [facility].  Home Health RN has  also been arranged for followup and assist as needed.  On March 05, 2009, the patient is  discharged to home.   DISCHARGED MEDICATIONS:  1. Vitamin C 1000 mg p.o. per day.  2. Protonix 40 mg a day.  3. Benicar 10 mg p.o. per day.  4. Multivitamin 1 a day.  5. Vitamin E 400 units a day.  6. Lovenox subcu 30 mg q.12 h.  7. Senokot-S 2 p.o. nightly followed by Dulcolax suppository and dig      stim in a.m.  8. Anusol-HC Suppository b.i.d. p.r.n.  9. Decadron taper at 4 mg b.i.d. for 2 days, then decrease to 2 mg      b.i.d. x2 days, then decrease to 1 mg for day until gone.  10.Baclofen 10 mg p.o. t.i.d.  11.Ditropan 10 mg p.o. b.i.d.  12.OxyIR 5 mg p.o. q.6-8 hours p.r.n. pain, #60 Rx.   DIET:  Regular.   ACTIVITY LEVEL:  24-hour supervision and assistance.  No alcohol, no  smoking, no driving, no strenuous activity.   FOLLOWUP:  The patient to follow up with Dr. Riley Kill in 6 weeks.  Follow  up with Dr. Jeral Fruit for postop check in 4-6 weeks.  Follow up with Dr.  Thad Ranger for routine check.  Follow up  with Dr. Jonny Ruiz for routine check.  Follow up with Dr. Annabell Howells for routine appointment in May for input  regarding urodynamic studies and other workup for neurogenic bladder.      Greg Cutter, P.A.      Ranelle Oyster, M.D.  Electronically Signed    PP/MEDQ  D:  03/05/2009  T:  03/06/2009  Job:  [id number]   cc:   Dr. Jonny Ruiz  Dr. Asencion Gowda, M.D.  Excell Seltzer. Annabell Howells, M.D.

## 2011-03-21 NOTE — Op Note (Signed)
NAME:  David Kim, David Kim                ACCOUNT NO.:  [id number]   MEDICAL RECORD NO.:  [id number]          PATIENT TYPE:  INP   LOCATION:  3107                         FACILITY:  [facility]   PHYSICIAN:  Hilda Lias, M.D.   DATE OF BIRTH:  [birth year]   DATE OF PROCEDURE:  02/12/2009  DATE OF DISCHARGE:                               OPERATIVE REPORT   PREOPERATIVE DIAGNOSIS:  T9-T10 thoracic intradural tumor.   POSTOPERATIVE DIAGNOSES:  T9-T10 thoracic intradural tumor.   PROCEDURES:  1. T8, 9, and T10, laminectomy and durotomy.  2. Gross total resection of the thoracic ependymoma, microscope.   SURGEON:  Hilda Lias, MD   ASSISTANT:  Danae Orleans. Venetia Maxon, MD   CLINICAL HISTORY:  David Kim is a 65 year old gentleman who had been  complaining of difficulty walking for several months.  He fell.  Lately,  had been becoming hyperreflexic.  He was seen by Dr. Thad Kim.  An MRI  was obtained and he was sent to further evaluation.  Surgery was  advised.   Marland Kitchen   PROCEDURE:  The patient was taken to the OR and after intubation, he was  positioned on prone manner.  The thoracolumbar area was cleaned with  DuraPrep.  X-ray showed that the needle was at the of L1 and the blood  was at the level of T9-10.  I then made a midline incision from benign  all the way up to T8-T7 was made.  Muscle was retracted all the way  laterally from then on with the [place], we removed the spinous process  of T10, T9, and T8.  Hemostasis was accomplished.  We have brought the  microscope into the area and we proceeded with opening the dura mater.  Prior to opening the dura mater, we were able to see that there was area  of dilatation of the spinal cord, which was mostly the area associated  with the supratumoral cyst and there was some changes of sort of  burgundy color in surface area of the dura mater.  After the incision  was made in the midline, the edges of the dura mater were retracted  laterally using  stitches.  Indeed, there was a tumor right there and we  started using the dissection at the pia mater to get into the tumor.  During the procedure, we did a electrophysiological monitoring.  We  opened the midline of the dura mater and immediately right in the middle  surface there was burgundy type of tumor.  Specimen was sent to the  laboratory and it came back as positive for ependymoma.  I then using  retraction of the pia mater with Prolene, we entered into the cavity of  the tumor.  Dissection was carried out above until we were able to see  the large cyst, which was drained.  Then dissection was carried below in  the inferior part until the small cyst inferiorly was also removed.  That is our boundary for the tumor.  Then the dissection was carried  down away from the spinal cord itself.  There was some time, there was  some changes in the electrophysiological monitor.  We stopped until they  were back to normal.  At the end we were able to do a total gross  resection of the tumor.  There was some area of sphincter capsule that  also was resected. Then, hemostasis was accomplished with Gelfoam.  At  the end, we had good decompression and good hemostasis.  The dura mater  was closed with a 4-0 Nurolon.  Tisseel was left in the  epidural space to prevent the CSF leak.  Then, the muscle was closed  with Vicryl and Steri-Strips.  During the procedure, a small tiny needle  was missing.  With the several x-rays, we cleaned the area where the  suction was and there was no evidence by x-ray of any needle in the  operative site.           ______________________________  Hilda Lias, M.D.     EB/MEDQ  D:  02/12/2009  T:  02/14/2009  Job:  [id number]   cc:   David Kim, M.D.

## 2011-03-21 NOTE — H&P (Signed)
NAME:  David Kim, David Kim                ACCOUNT NO.:  [id number]   MEDICAL RECORD NO.:  [id number]          PATIENT TYPE:  IPS   LOCATION:  4010                         FACILITY:  [facility]   PHYSICIAN:  Erick Colace, M.D.DATE OF BIRTH:  08/04/1946   DATE OF ADMISSION:  02/18/2009  DATE OF DISCHARGE:                              HISTORY & PHYSICAL   CHIEF COMPLAINT:  Numbness below the waist.   HISTORY OF PRESENT ILLNESS:  A 65 year old male with a prior history of  prostate carcinoma, had a gait disorder and a fall evaluated by  Neurology and imaging studies revealed a thoracic cord tumor T9 through  T10.  Admitted to the Neurosurgery service on February 12, 2009, for a T8-  T10 laminectomy and resection of tumor.  Postoperatively, the patient  had paraparesis/paraplegia in the PACT, taken back to the OR to explore,  wound hematoma was evacuated same day.  Physical therapy has been  initiated.  He had positive lower extremity spasticity and incontinence  of bowel.  He has had a Foley catheter placed.  He did have KUB today  demonstrating a moderate amount of colonic stool.  No evidence of  dilated bowel loops.   REVIEW OF SYSTEMS:  Positive for weakness and numbness below the waist  with insomnia, anxiety, and depression.  GU:  Indicates incontinence,  but this has been going on for years.  He currently has a Foley.  GI:  He has had incontinence of bowel.  Remainder of review is negative as  per health and history form.   PAST MEDICAL HISTORY:  As noted above:  1. Prostate carcinoma.  He has undergone radical prostatectomy.  2. He has hypertension.  3. Plantar fasciitis.  4. Bilateral THR.  5. He had a left lateral malleolus fracture, status post ORIF in the      past.  6. He has gout.   FAMILY HISTORY:  Significant for diabetes type 2 and CAD.   SOCIAL HISTORY:  Married, lives in one-level home, 4 steps to enter with  a ramp at the back.  He is retired from the [employer].   Negative  for tobacco.  Occasional wine cooler EtOH.  Wife can assist at  discharge.  Functional history prior to admission independent with a  cane, still driving.   HOME MEDICATIONS PRIOR TO ADMISSION:  1. Benicar 40 mg one-half p.o. daily.  2. Baclofen 5 mg q.8 h.  3. Etodolac 400 mg b.i.d.  4. Aspirin 81 mg a day.  5. Vitamin C 1000 mg per day.  6. Vitamin E 400 mg per day.  7. Calcium daily.   ALLERGIES:  1. AMOXICILLIN.  2. PHENYLEPHRINE/HYDROCODONE.   PHYSICAL EXAMINATION:  GENERAL:  Obese male, in no acute distress.  HEENT:  Eyes:  Anicteric, noninjected.  External ENT normal.  Hearing  appears normal.  NECK:  Supple without adenopathy.  RESPIRATORY:  Effort is good.  LUNGS:  Clear.  SKIN:  His skin incision is healing well, has multiple staples intact,  midline thoracic area.  Negative erythema.  Negative fluctuance.  HEART:  Regular  rate and rhythm.  No rubs, murmurs, or extra sounds.  Pedal pulses are good.  There is no evidence of edema in the lower  extremities.  ABDOMEN:  Positive bowel sounds.  Mildly distended and nontender.  No  organomegaly.  EXTREMITIES:  Upper extremity strength is 5/5 bilateral deltoid, biceps  and triceps grip.  Lower extremity strength is 0/5, hip flexor quad, TA,  gastroc.  Tone is reduced bilateral lower extremities.  He has zero muscle stretch  reflexes in the lower extremities, 1+ in the uppers.  His range of  motion is full in bilateral lower extremities.  SKIN:  No evidence of breakdown on heels.  PSYCHIATRIC:  Mood, memory, and affect are all intact.  NEUROLOGIC:  Upper extremity range of motion is normal.   POST ADMISSION POSITION EVALUATION:  1. Functional deficit, secondary to paraplegia due to ependymoma,      status post resection in postop hematoma.  2. The patient is admitted to receive collaborative interdisciplinary      care between the podiatrist, rehab nursing staff, and therapy team.  3. The patient's level  medical complexity and some vaginal therapy      needs in context of the medical necessity cannot be provided at      lesser intensive care such as SNF.  4. The patient has experienced potential functional loss from his      baseline.  Upon functional assessment at the time of preadmission      screening, the patient was at a total assist level of 30% bed      mobility transfers, 40% total assist, and non-ambulatory.  Upon      functional evaluation today, the patient had a mod assist level bed      mobility of +2, total 60% transfers, and total assist with ADLs for      lower body and can do upper body or facial hygiene and feeding.   The patient has made functional progress in the interval time, which  indicates his ability to make functional progress resulting in  measurable gains while on inpatient rehab.  These gains will be of  substantial practical use upon discharge home in facilitating mobility,  self-care, etc.  Interim changes in medical status since preadmission  screening are detailed in history of present illness.  1. Physiatrist will provide 24-hour management of medical needs as      well as oversight of therapy plan/treatment and provide guidance as      appropriate regarding the interaction of the tube.  2. A 24-hour rehab nursing will assist in the management of skin,      neurogenic bowel, neurogenic bladder, and help integrate therapy      concepts, techniques, and education.  3. PT will assess and treat for range of motion, strengthening, bed      mobility, transfers, pre-gait training, gait training as      appropriate as well as equipment management.  Goals are for      modified independent wheelchair level for mobility.   Further goals will depend on degree of neurologic recovery.  1. OT will assess and treat for range of motion, strengthening, ADLs,      cognitive perceptual training, splinting, and equipment.  2. Goals are for a modified independent level at  wheelchair level with      further goals depending upon further neurologic recovery.  3. Case Management and social worker will assess and treat for      psychosocial  issues and discharge planning.  4. A team conference will be held weekly to assess patient's      progress/goals and to determine barriers to discharge.  5. The patient has demonstrated sufficient medical stability and      exercise capacity to tolerate at least 3 hours of therapy a day at      least 5 days per week.   ESTIMATED LENGTH OF STAY:  Two to three weeks on the inpatient  rehabilitation unit.  Prognosis for further functional repeat recovery  is good.   MEDICAL PROBLEM LIST AND PLAN:  1. Functional deficits due to T9 and T10 ependymoma, status post      resection, postop hematoma, and paraplegia.  2. Hypertension, monitor on Benicar.  3. Neurogenic bowel and bladder, incontinent bladder prior to      admission.  Currently, incontinent bowel, we will need to      discontinue Foley once more mobile and initiate both the bladder      and bowel training program through nursing.  4. History of gastritis, continue Protonix.  5. Spasticity.  Currently, not an issue, although he does complain of      intermittent dysesthesias in the lower extremities thus far      difficult to say whether this represents neuropathic pain versus      spasticity.   His DVT prophylaxis will be with PAS hose and thromboembolic stockings.      Erick Colace, M.D.  Electronically Signed     AEK/MEDQ  D:  02/18/2009  T:  02/19/2009  Job:  [id number]   cc:   Hilda Lias, M.D.  Corwin Levins, MD

## 2011-03-21 NOTE — Discharge Summary (Signed)
NAMEMarland Kitchen  David Kim, David Kim                ACCOUNT NO.:  [id number]   MEDICAL RECORD NO.:  [id number]          PATIENT TYPE:  INP   LOCATION:  2609                         FACILITY:  [facility]   PHYSICIAN:  Hilda Lias, M.D.   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   CLINICAL HISTORY:  David Kim is a gentleman who was seen in my office  several weeks ago on April 1, complaining of some difficulty with the  balance, numbness of the legs and spasticity of the lower extremities.  Clinically, I found that he has weakness bilaterally of the iliopsoas,  quadriceps, and dorsiflexion of both legs and on the left leg, he has  1/5 weakness of the left foot.  He has a sensory level around thoracic 7  or 8 with the reflexes increased with Babinski bilaterally.  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  The patient was seen in the office.  He was  given advice.  He was offered a second opinion for surgery and then they  called to decide that they want to proceed with surgery.   LABORATORY DATA:  Normal.   COURSE IN THE HOSPITAL:  The patient was taken to surgery on April 9,  and with the help of electrical monitoring of the spinal cord we did a  thoracic 8, 9, and 10 laminectomy with total removal of ependymoma from  thoracic 9-10 with drainage of the cyst.  During the procedure, we had  no problem whatsoever in relation about the electrical monitoring.  There were no abnormalities whatsoever.  After surgery, the patient woke  up completely paraplegic.  Because of during the procedure there was no  electrical abnormality, we took him back into surgery thinking that  probably we  were dealing with hematoma.  There was some epidural  hematoma.  We opened the spinal cord and the spinal cord was essentially  negative.  The patient was kept overnight intubated in the ICU and  eventually he woke up with a dense paraplegia.  The patient's vital  signs have been stable.  He was seen by Physical Therapy and it was  decided on February 18, 2009, to be transferred to the rehab unit for  further care.   CONDITION ON DISCHARGE:  Stable with a dense paraplegia.   ACTIVITY:  As per rehab.   DIET:  Regular.   MEDICATIONS:  He will be continued taking the same medication.   FOLLOWUP:  I will continue to follow the patient while he is in the  rehab unit and later on in my office.          ______________________________  Hilda Lias, M.D.    EB/MEDQ  D:  03/23/2009  T:  03/23/2009  Job:  [id number]

## 2011-03-21 NOTE — Op Note (Signed)
NAME:  David Kim, David Kim                ACCOUNT NO.:  [id number]   MEDICAL RECORD NO.:  [id number]          PATIENT TYPE:  INP   LOCATION:  3172                         FACILITY:  [facility]   PHYSICIAN:  Hilda Lias, M.D.   DATE OF BIRTH:  July 17, 1946   DATE OF PROCEDURE:  02/12/2009  DATE OF DISCHARGE:                               OPERATIVE REPORT   PREOPERATIVE DIAGNOSIS:  Postoperative paraplegia, rule out wound  hematoma, thoracic.   POSTOPERATIVE DIAGNOSIS:  Wound hematoma.   PROCEDURE:  Opening of the thoracic wound, removal of muscle hematoma,  opening of the dura mater, negative exploration of the cavity from the  tumor resection.  The dura mater and the spinal cord were pulsatile.   DRAINS:  Hemovac in the epidural space and 40 mL of Surgifoam.   SURGEON:  Hilda Lias, MD   ASSISTANT:  Coletta Memos, MD   CLINICAL HISTORY:  The patient underwent surgery for a tumor in the  thoracic area.  The electrophysiological monitoring was negative during  the procedure.  By the time the patient went to the PACU, the patient  was placed paraplegic.  We waited for the patient to be fully awake and  he did not move and because it held up, I decided to take him back to  surgery.  The patient had been taking anti-inflammatory for a long  while.  Because of the severity of the case, we bypassed any further  radiological studies.   PROCEDURE IN DETAIL:  The patient was taken back to the operating room  and was positioned in a prone manner.  The wound was opened and  immediately in the muscle we found quite a bit of hematoma.  We went  straight down and there was some epidural hematoma.  The Tisseel on top  of the dura mater was completely intact.  The Tisseel was removed and we  decided to explore the area where this tumor was.  The cavity was  absolutely dry and there was a normal CSF.  The spinal cord was  pulsatile.  Because of that, we closed the dura mater using the 4-0  Nurolon.   Tisseel was also applied to the pleural space.  Most of the  bleeding was coming from the muscle.  We used 40 mL with 2 syringes of  Surgifoam to stop the diffuse bleeding.  This bleeding was most likely  coming from the chronic intake of inflammatory.  Once we achieved good  hemostasis, we left a large Hemovac in the epidural space.  The wound  was closed with Vicryl and staples.  The patient is going to go to the  Intensive Care Unit.  We are going to try to wake him up to see how well  he does.  He is going to remain intubated until tomorrow morning.  Tomorrow morning, we will try to do this fully if he is not any better.  At least, we try to take the only complication then we can think about  producing the dense paraplegia.           ______________________________  Hilda Lias, M.D.     EB/MEDQ  D:  02/12/2009  T:  02/14/2009  Job:  [id number]

## 2011-03-21 NOTE — Assessment & Plan Note (Signed)
David Kim is back regarding his T10 spinal cord injury.  I last saw him on  July 14.  We titrated his baclofen.  He called on June 09, 2009,  stating his spasticity would worse and he felt it was due to his  baclofen, so we decreased it slightly, initiated Zanaflex, which he is  taking 2 mg t.i.d.  He feels that this helps somewhat, but he is still  having a lot of tone.  Dr. Jeral Fruit performed an MRI, which showed no new  abnormalities of the spine per the patient.  He is concerned that his  spasticity is interfering with his walking and wonders about his  prognosis.  Sleep is fair.  Mood has been a bit poor at times.  He  remains incontinent of bowel and bladder, but is on a schedule with  this.  He follows up with Dr. Chauncy Lean regarding his chronic spasms and is on  oxybutynin for control.   SOCIAL HISTORY:  The patient is married, living with his wife who is  with him today.  He last worked, December 2004.   PHYSICAL EXAMINATION:  VITAL SIGNS:  Blood pressure is 158/92.  He  states it is 122/80 at home today.  Pulse is 80, respiratory rate 18.  He is sating 97% on room air.  GENERAL:  The patient is alert, bit flat.  He has 5/5 strength in the  upper extremities, 2/2 sensation.  Lower extremity strength is 0/5 that  I can tell today with 0/2 sensation.  Reflex is 3+ and he has 3/4 tone  in the hips, knees, and ankles today.  He has knee/leg straps for  movement.  HEART:  Regular rate.  CHEST:  Clear.  ABDOMEN:  Soft, nontender.   ASSESSMENT:  1. 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  2. History of hypertension.  3. Spastic paraplegia.   PLAN:  1. I had a long discussion again today with the patient and his wife      regarding his prognosis and expectations.  He still does not seem      to have a real grasp of what is going on here in my opinion.  I      always wonder how much further education really will matter in this      case.   I described to him the fact that we can try and treat his      spasticity aggressively; however, the severe spasticity in the      setting of his weakness and sensory loss is a sign that this is a      severe injury.  He had an MRI done, which showed no syrinx,      hematoma, or recurrent pathophysiology.  Therefore, at this point      we will further treat his symptoms.  I will aggressively titrate up      his Zanaflex, which will be at 16 mg total a day by the end of the      week.  We will stay with the baclofen dosing for now.  He may need      to stop his baclofen as well.  We will need to consider Botox      injections as well as ongoing stretching range of motion.  He may      be a baclofen pump candidate as well.  2. We discussed the fact that he has  stood before, but the reality is      that he does not have any voluntary muscle contraction and that he      was probably standing on his resting tone.  I think he had some      concept of this after we discussed today.  We also discussed that      ablation of his resting tone may infact interfere with his ability      to stand and transfer.  3. I will see the patient back in 4-6 weeks time and not sooner.  I      told him to call me with any problems or      questions.  I did in fact asked his wife to call me at about 10      days to give me summary of how he has done with the Zanaflex      titration.      Ranelle Oyster, M.D.  Electronically Signed     ZTS/MedQ  D:  06/30/2009 15:33:05  T:  07/01/2009 06:30:18  Job #:  [id number]   cc:   Hilda Lias, M.D.  Fax: [phone]   John L. Rendall, M.D.  Fax: [phone]

## 2011-03-21 NOTE — Assessment & Plan Note (Signed)
HISTORY:  [name] is back regarding his T10 spinal Kim injury.  He  was discharged from rehab on March 05, 2009, and for multiple reasons,  he has not been back for followup.  Main issue of concern is  intermittent cathing and urinary issues.  He had urodynamic testing  recently through Dr. Belva Crome office.  He is awaiting on results.  He is  cathing himself 4 times a day and volume seemed to be around 250-300 mL.  Denies any odor, burning, anything of that nature.  Uses digital  stimulation daily for bowel program and has been continent.  There was  occasional constipation noted.  There are complaints of persistent  spasms in both legs with spasticity.  He is on baclofen now at 15 mg q.8  h.  He tried increasing it to 20 mg q.8 h. and he felt spasticity may in  fact have been worse.  The patient has been receiving outpatient PT,  working on standing, balance, transfers, range of motion, etc.  He  states that therapy has suggested water therapy.   REVIEW OF SYSTEMS:  Notable for bladder control problems, weakness,  numbness, trouble walking, spasm, confusion, weight gain, limb swelling,  and sleep apnea.  Other pertinent positives above and full review is in  the written health and history section of the chart.   SOCIAL HISTORY:  The patient is married, lives with his wife and she  remains supportive and helpful in his care.   PHYSICAL EXAMINATION:  VITAL SIGNS:  Blood pressure is 126/72, pulse 76,  respiratory rate 18, and he is sating 97% on room air.  GENERAL:  The patient is pleasant, alert, and oriented x3.  Affect is  bright and generally appropriate.  EXTREMITIES:  Sometimes, he is a bit flat in affect.  The patient has  0/2 sensation in both lower extremities as well as 0/5 volitional  movement.  He has 2-3/4 tone in the legs particularly on the right  hamstring muscles.  Reflexes are 3+.  He has clonus.  He has frequent  spasms spontaneously in the room today.  Posture is  fair.  SKIN:  Intact.  HEART:  Regular.  CHEST:  Clear.  ABDOMEN:  Soft and nontender.   ASSESSMENT:  1. T10 spinal Kim injury due to ependymoma with postop hematoma and      paraparesis.  2. History of hypertension.  3. Lower extremity spastic paraplegia.   PLAN:  1. He and his wife still remain a bit unrealistic about his prognosis.      I think they are still expecting to have substantial recovery and      it is quite obvious at this point that is not  to be the case.  I      think realistic goals are working on spasticity control of bladder      and bowel function and better independent with mobility.  He may in      fact be able to use some of his spasticity in a constructive matter      to help with transfers.  2. The patient will follow up with Dr. Annabell Kim regarding his urodynamic      testing.  He is on Ditropan already twice daily for bladder spasms.      We will what else Dr. Beola Kim to do here.  He is keeping his      caths at a q.6 h. schedule essentially because of the fact that  anything over 300 mL kicks off and causes incontinence.  3. Consider water therapy at some point.  4. Increase baclofen to 20 mg t.i.d. for 1 week, then q.i.d.      thereafter.  Consider Zanaflex trial as well.  The patient may be a      Botox candidate or even a spasticity pump candidate down the line.  5. Neurosurgery.  Followup with Dr. Jeral Kim.  6. I will see him in back about 6 weeks' time.      David Kim, M.D.  Electronically Signed     ZTS/MedQ  D:  05/19/2009 14:33:49  T:  05/20/2009 05:12:42  Job #:  [id number]   cc:   David Levins, MD  [address]  [place]  [place] [address]   David Kim. David Kim, M.D.  Fax: [phone]   David Kim, M.D.  Fax: [phone]

## 2011-03-24 NOTE — Op Note (Signed)
   NAME:  David Kim, SMESTAD NO.:  [id number]   MEDICAL RECORD NO.:  [id number]                   PATIENT TYPE:  AMB   LOCATION:  NESC                                 FACILITY:  [facility]   PHYSICIAN:  Excell Seltzer. Annabell Howells, M.D.                 DATE OF BIRTH:  1945/11/29   DATE OF PROCEDURE:  05/07/2003  DATE OF DISCHARGE:                                 OPERATIVE REPORT   PREOPERATIVE DIAGNOSES:  1. Foreign body at bladder neck.  2. Bladder neck contracture.   POSTOPERATIVE DIAGNOSES:  1. Foreign body at bladder neck.  2. Bladder neck contracture.   PROCEDURES:  1. Cystoscopy.  2. Dilation of bladder neck contracture.  3. Removal of foreign body.   SURGEON:  Excell Seltzer. Annabell Howells, M.D.   ASSISTANT:  Susanne Borders, M.D.   ANESTHESIA:  General.   INDICATION FOR PROCEDURE:  [name] is six months status post radical  retropubic prostatectomy.  He has had increasing complaints of urinary urge  incontinence.  He was recently evaluated with office cystoscopy and was  found to have a bladder neck calculus.  After understanding risks and  benefits, the patient consented to cystoscopy with removal of this foreign  body in his bladder neck.   DESCRIPTION OF PROCEDURE:  The patient was brought to the operating room and  given general endotracheal anesthesia.  He was prepped and draped in typical  sterile fashion.  The 22 French cystoscope with 30 degree lens was used to  evaluate the patient's urethra and bladder.  The anterior urethra was within  normal limits.  The posterior urethra demonstrated absent prostatic urethra.  There was a bladder neck contracture present.  There was a calculus that was  attached to the vesicourethral anastomosis.  This calculus was grasped with  flexible graspers and manipulated into the bladder without difficulty and  then removed from the bladder.  There was no bleeding noted after the  foreign body was removed.  The bladder was then  carefully irrigated and all  residual fragments of the stone passed easily through the urethra.  There  were no complications.  Please note that Dr. Annabell Howells was present and  participated throughout the case.     Theresia Bough. Annabell Howells, M.D.    DR/MEDQ  D:  05/07/2003  T:  05/07/2003  Job:  [id number]

## 2011-03-24 NOTE — Op Note (Signed)
NAME:  David Kim, CIHLAR NO.:  [id number]   MEDICAL RECORD NO.:  [id number]                   PATIENT TYPE:  INP   LOCATION:  0003                                 FACILITY:  [facility]   PHYSICIAN:  Excell Seltzer. Annabell Howells, M.D.                 DATE OF BIRTH:  January 08, 1946   DATE OF PROCEDURE:  10/09/2002  DATE OF DISCHARGE:                                 OPERATIVE REPORT   PREOPERATIVE DIAGNOSES:  Prostate cancer.   POSTOPERATIVE DIAGNOSES:  Prostate cancer.   PROCEDURE:  1. Bilateral pelvic lymph node dissection.  2. Radical retropubic prostatectomy.   ANESTHESIA:  General.   SURGEON:  Excell Seltzer. Annabell Howells, M.D.   ASSISTANT:  Melvyn Novas, M.D.   ESTIMATED BLOOD LOSS:  1350 cc.   DRAINS:  Foley catheter and Jackson-Pratt drain.   COMPLICATIONS:  None.   BRIEF HISTORY:  [name] is a 65 year old white male with a recently  diagnosed T1C prostate cancer with a preoperative PSA of 5.8.  His biopsy  and pathology demonstrated a Gleason 3+3=6 adenocarcinoma on the right as  well as a Gleason 3+4=7 adenocarcinoma on the left. After discussion of the  procedure and other options, he elected for radical retropubic  prostatectomy.   DESCRIPTION OF PROCEDURE:  Following the administration of antibiotics and  anesthesia, [name] was prepped and draped in the supine position. A lower  midline abdominal incision was made from the pubic symphysis rostrally to 3  cm inferior to the umbilicus.  Bovie cautery was used to dissect down  through the subcutaneous fat down to the anterior rectus fascia.  The fascia  was then opened in the midline and the rectus muscles were retracted  laterally.  The underlying transversalis was gently teased off the inferior  aspect of the rectus muscles.  A Bookwalter retractor was placed and the  rectus muscles were retracted laterally.  The left and right iliac veins  were exposed by gently sweeping the transversalis and loose fatty  tissues  from the lateral pelvic sidewalls bluntly.  Beginning with the patient's  right side, the adventitial tissue overlying the iliac vein was divided  using Metzenbaum scissors and gently dissected off the vein.  The veins were  then retracted superiorly gently using hand-held vein retractor, exposing  the underlying obturator lymph node packets and obturator nerve.  The  obturator lymph node packet was systematically dissected from the nerve and  overlying iliac vein both bluntly and with right angle dissecting  instruments.  The lymph node packs were clipped using HemoLoc clips and  removed from the patient.  This was then repeated on the left-hand side.  Both obturator nerves were intact.  At that time we placed a midline  retractor and retracted the bladder superiorly.  The endopelvic fascia was  pierced bilaterally on each side of the prostate gland and opened so that  the  surgeon could place his fingers around the lateral aspect of the  prostate and palpate the Foley catheter and the dorsal venous complex.  A  Hohenfelter clamp was then passed underneath the dorsal venous complex  distally in the pelvis and cut side off using 2-0 Vicryl ties.  A back  bleeding stitch was then placed slightly proximally underneath the dorsal  venous complex, and the complex was then tied off proximally.  The dorsal  venous complex was then divided using Bovie cautery in between the two ties.  The underlying anterior aspect of the urethra was then entered using the  Bovie cautery, so the Foley catheter was visible.  A round clamp was then  placed into the posterior aspect of the urethra and umbilical tape was  passed underneath the posterior aspect of the urethra.  At that time the  Foley catheter was grasped or visible through the urethrotomy.  The distal  end of the catheter distal to the meatus was then lubricated and the Foley  catheter was divided and pulled up through the wound.  This exposed  the  posterior aspect of the urethra, which was sharply divided using Metzenbaum  scissors over the umbilical tape.  This created a plane between the prostate  gland and the rectum, which was bluntly dissected using the surgeon's finger  underneath the prostate.  At this time the prostate was still tethered by  the lateral pedicles, which were taken down using right angle dissecting  device and clips as well as with the Bovie cautery.  Both lateral pedicles  were taken down systematically in this fashion.  We then began to dissect  out the bladder neck between the prostate and the bladder systematically  using dissecting instruments and Bovie cautery.  In so doing, we easily  found the plane to visualize the ampullae of the vas deferens and the  seminal vesicles.  The vasa deferentia were systematically dissected out on  the left and right of the midline and clipped and divided.  The seminal  vesicles were found just lateral to the ampullae and were dissected out in  their entirety, and their attachments were clipped and divided with Bovie  cautery.  We then finished removing the prostate from its connections to the  bladder and the bladder neck using Bovie cautery.  The prostate was then  removed from the field.  Indigo carmine was given, and blue urine was seen  shortly thereafter in the bladder.  The mucosa of the bladder neck was  inverted using interrupted 4-0 chromic sutures.  The Foley catheter was then  replaced and passed into the surgical wound.  We then placed five  anastomotic sutures through the bladder neck and urethral stump  systematically at 12 o'clock, 10 o'clock, 2 o'clock, 5 o'clock, and 7  o'clock.  The Foley catheter was then placed into the bladder.  Anastomotic  sutures were pulled tight, and the catheter was inflated to 15 cc.  The  sutures were then tied down.  Prior to doing so, a Prolene suture was placed through the tip of the Foley catheter and brought out  through a separate  stab wound in the bladder to tether the catheter.  At that time the Foley  catheter was irrigated and the anastomosis appeared quite adequate.  The  wound was irrigated.  Any sites of venous oozing were fulgurated with the  Bovie cautery.  The Prolene suture attached to the catheter was then brought  out on the  right side of the abdominal wall through a separate stab wound  and tied to a button.  A JP drain was brought out through a separate stab  wound to the left of the incision.  The incision was then closed using a  running #1 PDS to close the rectus fascia.  The skin was then closed using  staples.  The wound was sterilely cleaned and dressed, and the procedure was  terminated.  The patient was taken to the recovery room in stable condition.     Melvyn Novas, M.D.                      Excell Seltzer. Annabell Howells, M.D.    DK/MEDQ  D:  10/09/2002  T:  10/09/2002  Job:  [id number]

## 2011-03-24 NOTE — Op Note (Signed)
   NAME:  David Kim, David Kim NO.:  [id number]   MEDICAL RECORD NO.:  [id number]                   PATIENT TYPE:  AMB   LOCATION:  NESC                                 FACILITY:  [facility]   PHYSICIAN:  Susanne Borders, MD                     DATE OF BIRTH:  [birth year]   DATE OF PROCEDURE:  05/13/2003  DATE OF DISCHARGE:  05/07/2003                                 OPERATIVE REPORT   ADDENDUM:  Please note that after the calculus was grasped and removed, it  was noted that the foreign body was a retained Hem-o-loc clip.                                               Susanne Borders, MD    DR/MEDQ  D:  05/13/2003  T:  05/14/2003  Job:  [id number]

## 2011-03-24 NOTE — Discharge Summary (Signed)
NAME:  David Kim, David Kim NO.:  [id number]   MEDICAL RECORD NO.:  [id number]                   PATIENT TYPE:  INP   LOCATION:  0365                                 FACILITY:  [facility]   PHYSICIAN:  Excell Seltzer. Annabell Howells, M.D.                 DATE OF BIRTH:  02/26/1946   DATE OF ADMISSION:  10/09/2002  DATE OF DISCHARGE:  10/12/2002                                 DISCHARGE SUMMARY   HISTORY OF PRESENT ILLNESS:  The patient is a 65 year old white male,  initially seen in consultation by Dr. Oliver Barre for an elevated PSA of 5.8.  He underwent a prostate biopsy after his PSA failed to decline with a month  of antibiotics.  This demonstrated bilateral adenocarcinoma of the prostate  with a Gleason 6 on the right, and a Gleason 7 on the left.  He was felt to  be a T1C, and after discussion of treatment options, he has elected to  radical retropubic prostatectomy.   ALLERGIES:  1. INDERAL.  2. AMOXICILLIN.   MEDICATIONS:  1. Celebrex p.r.n.  2. Hyoscyamine 0.375 mg q.d.  3. Prilosec 20 mg q.d.  4. Vitamin C.  5. Vitamin E.  6. Calcium supplement.  7. Goody's headache powders.  8. Multivitamin q.d.   PAST MEDICAL HISTORY:  1. Spondylosis in his back, L3-S1.  2. History of duodenal ulcer.   PAST SURGICAL HISTORY:  1. Plantar fasciitis surgery in 6/02.  2. Left inguinal hernia in 1999.  3. Right inguinal hernia in 1987.   SOCIAL HISTORY:  Negative for tobacco or alcohol.   FAMILY HISTORY:  Pertinent for heart disease, high blood pressure, and  diabetes.   REVIEW OF SYMPTOMS:  He has occasional headaches.  Some urgency with  urination.  Occasional constipation, but is otherwise without complaint.   For additional details of the history and physical, please see the office  H&P on the chart.   LABORATORY DATA:  Hemoglobin 15.2, white blood cell count 16.7.  Sodium 136,  potassium 3.7, glucose 100.  EKG showed normal sinus rhythm, normal EKG.  Chest  x-ray showed no active disease.   HOSPITAL COURSE:  On the day of admission, the patient was taken to the  operating room, he received 1 g of Ancef, and underwent a radicle retropubic  prostatectomy with bilateral pelvic lymphadenectomy.  Estimated blood loss  was 1350 cc.  Postoperatively, Foley catheter and Jackson-Pratt drain were  left in.  The patient tolerated the procedure well without complications.  Postoperatively, the evening of surgery he was doing well with some moderate  Jackson-Pratt drainage, but good urine output.  On the first postoperative  day, he had a temperature max of 100, vital signs were stable.  He had good  urine output.  Jackson-Pratt drain had 100 cc out.  Hemoglobin was 9.2,  electrolytes were unremarkable.  His diet was advanced.  On the second  postoperative day, he continued to do well.  He had occasional bloating.  His urine was clear.  He had about 150 cc of his Jackson-Pratt drain on the  first postoperative day.  His ambulation was increased.  His IV was  discontinued.  His diet was advanced.  On the third postoperative day, he  was afebrile, he had moderate Jackson-Pratt drainage, and good urine output.  Hemoglobin was 8.8, hematocrit was 25.  The Jackson-Pratt drainage was just  serosanguinous fluid, and the Jackson-Pratt drain was removed.  His incision  was intact, and he was felt to be ready for discharge home.   FINAL DIAGNOSIS:  Gleason 7, T2C N0 M0 adenocarcinoma of the prostate.   COMPLICATIONS:  None.   DISCHARGE MEDICATIONS:  1. Levaquin 500 mg one p.o. q.d. starting one day prior to return for     catheter removal.  2. Vicodin one or two p.o. q.4-6h. p.r.n. pain.  3. Iron sulfate tablet 325 mg p.o. t.i.d.   FOLLOWUP:  He was instructed to follow up with me in one week for staple  removal, two weeks for catheter removal.   DISPOSITION:  To home.   PROGNOSIS:  Good.   CONDITION ON DISCHARGE:  Improved.                                                Excell Seltzer. Annabell Howells, M.D.    JJW/MEDQ  D:  10/23/2002  T:  10/24/2002  Job:  [id number]   cc:   Corwin Levins, M.D. [facility]

## 2011-06-30 ENCOUNTER — Other Ambulatory Visit: Payer: Self-pay | Admitting: Internal Medicine

## 2011-07-26 ENCOUNTER — Other Ambulatory Visit: Payer: Self-pay | Admitting: Dermatology

## 2011-07-28 ENCOUNTER — Encounter: Payer: Self-pay | Admitting: Pulmonary Disease

## 2011-07-28 LAB — BASIC METABOLIC PANEL
BUN: 31 — ABNORMAL HIGH
Chloride: 105
Creatinine, Ser: 1.26
Glucose, Bld: 104 — ABNORMAL HIGH
Potassium: 4.3

## 2011-07-28 LAB — POCT HEMOGLOBIN-HEMACUE: Hemoglobin: 17

## 2011-07-31 ENCOUNTER — Encounter: Payer: Self-pay | Admitting: Pulmonary Disease

## 2011-07-31 ENCOUNTER — Ambulatory Visit (INDEPENDENT_AMBULATORY_CARE_PROVIDER_SITE_OTHER): Payer: Federal, State, Local not specified - PPO | Admitting: Pulmonary Disease

## 2011-07-31 VITALS — BP 120/80 | HR 77 | Temp 97.3°F

## 2011-07-31 DIAGNOSIS — G4733 Obstructive sleep apnea (adult) (pediatric): Secondary | ICD-10-CM

## 2011-07-31 DIAGNOSIS — Z23 Encounter for immunization: Secondary | ICD-10-CM

## 2011-07-31 NOTE — Progress Notes (Signed)
  Subjective:    Patient ID: David Kim, male    DOB: 1945-12-10, 65 y.o.   MRN: [id number]  HPI The patient comes in today for followup of his known sleep apnea.  He is wearing CPAP compliantly, and reports no issues with his mask fit or pressure.  He feels that he is sleeping as well as can be expected during the night, but only gets about 5 hours of sleep each night because of medications that he has to take at a certain time.  He has been keeping up with mask changes.   Review of Systems  Constitutional: Negative for fever and unexpected weight change.  HENT: Positive for congestion and sinus pressure. Negative for ear pain, nosebleeds, sore throat, rhinorrhea, sneezing, trouble swallowing, dental problem and postnasal drip.   Eyes: Negative for redness and itching.  Respiratory: Negative for cough, chest tightness, shortness of breath and wheezing.   Cardiovascular: Negative for palpitations and leg swelling.  Gastrointestinal: Negative for nausea and vomiting.  Genitourinary: Negative for dysuria.  Musculoskeletal: Negative for joint swelling.  Skin: Negative for rash.  Neurological: Negative for headaches.  Hematological: Does not bruise/bleed easily.  Psychiatric/Behavioral: Negative for dysphoric mood. The patient is not nervous/anxious.        Objective:   Physical Exam Well-developed male in no acute distress No skin breakdown or pressure necrosis from the CPAP mask Lower extremities with mild edema, no cyanosis noted Alert, does not appear to be sleepy, does not move the lower extremities.       Assessment & Plan:

## 2011-07-31 NOTE — Assessment & Plan Note (Signed)
The patient is doing well on his current CPAP.  He is keeping up with mask changes and filters, and feels that he sleeps fairly well during the night.  He is only getting about 5 hours a night because he has to awaken to take medications, and subsequently needs to take a nap during the day.  I have asked him to be careful with the duration of sleep during the day.  He is to stay on CPAP at his current setting, and to work on weight loss.

## 2011-07-31 NOTE — Patient Instructions (Signed)
Continue with cpap Will give you a flu shot today. followup with me in one year.

## 2011-07-31 NOTE — Progress Notes (Signed)
Addended by: Julaine Hua on: 07/31/2011 02:56 PM   Modules accepted: Orders

## 2011-09-22 ENCOUNTER — Other Ambulatory Visit: Payer: Self-pay | Admitting: Internal Medicine

## 2011-10-20 ENCOUNTER — Encounter: Payer: Self-pay | Admitting: Endocrinology

## 2011-10-20 ENCOUNTER — Ambulatory Visit (INDEPENDENT_AMBULATORY_CARE_PROVIDER_SITE_OTHER): Payer: Federal, State, Local not specified - PPO | Admitting: Endocrinology

## 2011-10-20 VITALS — BP 150/90 | HR 69 | Temp 98.8°F

## 2011-10-20 DIAGNOSIS — T3111 Burns involving 10-19% of body surface with 10-19% third degree burns: Secondary | ICD-10-CM

## 2011-10-20 NOTE — Progress Notes (Signed)
  Subjective:    Patient ID: David Kim, male    DOB: Mar 06, 1946, 65 y.o.   MRN: [id number]  HPI Pt states few hrs of moderate pain at the left thigh.  This started in the context of resting a sander on it.  No assoc fever.   Past Medical History  Diagnosis Date  . Low back pain   . Gout   . IBS (irritable bowel syndrome)   . Peptic ulcer disease   . Allergic rhinitis   . Depression   . Depression   . Anxiety   . Erectile dysfunction   . OSA (obstructive sleep apnea)   . GERD (gastroesophageal reflux disease)   . Prostate cancer   . Hypertension   . Carpal tunnel syndrome     right  . Fatigue     Past Surgical History  Procedure Date  . Prostatectomy   . Inguinal heniorrhaphy   . Tonsillectomy   . Pilonidal cyst / sinus excision   . Left leg surgery after fibula     History   Social History  . Marital Status: Married    Spouse Name: N/A    Number of Children: N/A  . Years of Education: N/A   Occupational History  . retired    Social History Main Topics  . Smoking status: Never Smoker   . Smokeless tobacco: Not on file  . Alcohol Use: Not on file  . Drug Use: Not on file  . Sexually Active: Not on file   Other Topics Concern  . Not on file   Social History Narrative  . No narrative on file    Current Outpatient Prescriptions on File Prior to Visit  Medication Sig Dispense Refill  . aspirin (ECOTRIN LOW STRENGTH) 81 MG EC tablet Take 81 mg by mouth daily.        . baclofen (LIORESAL) 20 MG tablet Take 20 mg by mouth every 6 (six) hours.        . clonazePAM (KLONOPIN) 2 MG tablet 1/2 by mouth in the am and 1 tablet at bedtime       . losartan (COZAAR) 50 MG tablet take 1 tablet by mouth once daily  90 tablet  0  . oxybutynin (DITROPAN) 5 MG tablet Take 5 mg by mouth 4 (four) times daily.        Marland Kitchen senna (SENOKOT) 8.6 MG tablet Take 2 tablets by mouth at bedtime as needed.       . TiZANidine HCl (ZANAFLEX PO) Take 8 mg by mouth. Take 2 tabs every 6  hours  For a total of 8 tabs per day.         Allergies  Allergen Reactions  . Amoxicillin   . Endal Hd     Family History  Problem Relation Age of Onset  . Diabetes      BP 150/90  Pulse 69  Temp(Src) 98.8 F (37.1 C) (Oral)  SpO2 97%  Review of Systems He has no pain, due to neurologic deficit    Objective:   Physical Exam VITAL SIGNS:  See vs page GENERAL: no distress.  In wheelchair. Left anterior thigh: 12x7 cm area of erythema, with overlying blister. No open ulcer.   (no sensation below the waist).      Assessment & Plan:  Burn injury, new HTN, with prob situational component

## 2011-10-20 NOTE — Patient Instructions (Signed)
Daily, apply antibiotic ointment and a bandage.  Keep the area clean and moist. Every 1-2 days, gently pour soapy water, then regular water on the wound.  Pat dry around, but not on the wound.   Call if you want to see a wound specialist, and i would be happy to refer you.

## 2011-10-22 DIAGNOSIS — T3111 Burns involving 10-19% of body surface with 10-19% third degree burns: Secondary | ICD-10-CM | POA: Insufficient documentation

## 2011-12-20 ENCOUNTER — Other Ambulatory Visit: Payer: Self-pay | Admitting: Internal Medicine

## 2012-01-15 ENCOUNTER — Other Ambulatory Visit: Payer: Self-pay | Admitting: *Deleted

## 2012-01-15 MED ORDER — TIZANIDINE HCL 4 MG PO TABS: 8.0000 mg | ORAL_TABLET | Freq: Four times a day (QID) | ORAL | Status: AC

## 2012-02-13 ENCOUNTER — Other Ambulatory Visit: Payer: Self-pay | Admitting: *Deleted

## 2012-02-13 MED ORDER — TIZANIDINE HCL 4 MG PO TABS: ORAL_TABLET | ORAL | Status: DC

## 2012-03-12 ENCOUNTER — Encounter
Payer: Federal, State, Local not specified - PPO | Attending: Physical Medicine & Rehabilitation | Admitting: Physical Medicine & Rehabilitation

## 2012-03-12 ENCOUNTER — Encounter: Payer: Self-pay | Admitting: Physical Medicine & Rehabilitation

## 2012-03-12 VITALS — BP 114/73 | HR 84 | Resp 14 | Ht 67.0 in | Wt 200.0 lb

## 2012-03-12 DIAGNOSIS — S24101A Unspecified injury at T1 level of thoracic spinal cord, initial encounter: Secondary | ICD-10-CM

## 2012-03-12 DIAGNOSIS — G839 Paralytic syndrome, unspecified: Secondary | ICD-10-CM

## 2012-03-12 DIAGNOSIS — C719 Malignant neoplasm of brain, unspecified: Secondary | ICD-10-CM | POA: Insufficient documentation

## 2012-03-12 DIAGNOSIS — S24109A Unspecified injury at unspecified level of thoracic spinal cord, initial encounter: Secondary | ICD-10-CM

## 2012-03-12 DIAGNOSIS — IMO0002 Reserved for concepts with insufficient information to code with codable children: Secondary | ICD-10-CM | POA: Insufficient documentation

## 2012-03-12 DIAGNOSIS — I10 Essential (primary) hypertension: Secondary | ICD-10-CM | POA: Insufficient documentation

## 2012-03-12 DIAGNOSIS — G8220 Paraplegia, unspecified: Secondary | ICD-10-CM | POA: Insufficient documentation

## 2012-03-12 DIAGNOSIS — X58XXXS Exposure to other specified factors, sequela: Secondary | ICD-10-CM | POA: Insufficient documentation

## 2012-03-12 HISTORY — DX: Unspecified injury at unspecified level of thoracic spinal cord, initial encounter: S24.109A

## 2012-03-12 NOTE — Progress Notes (Signed)
Subjective:    Patient ID: David Kim, male    DOB: 03-21-1946, 66 y.o.   MRN: [id number]  HPI  David Kim  Is back regarding his spinal cord injury. He burned his left thigh in December while he was working in his shop. He continues to wear a dressing over the left thigh.   He has regained the sensation in his bladder and can tell when he needs to go. He is able to empty on his own. He states he is continent of bowel but he's sensitive to too much fluid or too little causing diarrhea or constipation.  He reports more sensation in his right foot as well as movements there. Left side hasn't shown much change.   He still uses his standing frame. Dr. Terrace Arabia increased  His baclofen to 25mg  q6 hours. He remains on tizanidine 8mg  qid. He hasn't had LFT's in the last year. He remains on his klonopin as well. He came off the gabapentin some time ago.   Pain Inventory Average Pain 0 Pain Right Now 0 My pain is N/A  In the last 24 hours, has pain interfered with the following? General activity 0 Relation with others 0 Enjoyment of life 0 What TIME of day is your pain at its worst? N/A Sleep (in general) Fair  Pain is worse with: N/A Pain improves with: N/A Relief from Meds: N/A  Mobility ability to climb steps?  no do you drive?  no use a wheelchair transfers alone  Function retired I need assistance with the following:  bathing, toileting, meal prep, household duties and shopping  Neuro/Psych bladder control problems weakness numbness trouble walking spasms  Prior Studies Any changes since last visit?  no  Physicians involved in your care Any changes since last visit?  no  Review of Systems  HENT: Negative.   Eyes: Negative.   Respiratory: Negative.   Cardiovascular: Negative.   Gastrointestinal: Positive for constipation.  Genitourinary: Positive for urgency.  Musculoskeletal: Negative.   Neurological: Positive for weakness and numbness.  Hematological: Negative.     Psychiatric/Behavioral: Negative.        Objective:   Physical Exam  Constitutional: He appears well-developed and well-nourished.  HENT:  Head: Normocephalic and atraumatic.  Eyes: Conjunctivae and EOM are normal. Pupils are equal, round, and reactive to light.  Neck: Normal range of motion. Neck supple.  Cardiovascular: Normal rate and regular rhythm.   Pulmonary/Chest: Effort normal and breath sounds normal.  Neurological:       No volitional movement in the legs. He uses his hips and trunk to help move his legs. He has no sensation below the level of injury. He has minimal tone in the legs. DTR's are hyperactive.    Sitting posture is good.   Skin:       Has a large area of scar over  The medial thigh measurign about 5 inches by 2-3 inches in diameter. It's a bit moist as its covered by a dressing an neosporin.  Psychiatric: He has a normal mood and affect. His behavior is normal. Judgment and thought content normal.          Assessment & Plan:  ASSESSMENT:  1. 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  2. Hypertension.  3. Left thigh wound which appears healed and is scarred over.   PLAN:  1. The patient is doing very well with his spasticity management. We  will continue with his baclofen, tizanidine, and Klonopin as  previously dosed.  2. We discussed maintenance exercises arranged etc. He will followup e with [facility] regarding his bladder.  3. Continue with standing frame and home exercises. He and his wife remain meticulous. 4. I will see him back on a yearly.  5. Recommended leaving wound open to air using a small amount of neosporin

## 2012-03-12 NOTE — Patient Instructions (Signed)
Continue your exercises.  Use a small amount of neosporin over your thigh but keep it open to the air.

## 2012-03-13 ENCOUNTER — Telehealth: Payer: Self-pay | Admitting: Physical Medicine & Rehabilitation

## 2012-03-13 LAB — HEPATIC FUNCTION PANEL
ALT: 25 U/L (ref 0–53)
AST: 23 U/L (ref 0–37)
Albumin: 4.5 g/dL (ref 3.5–5.2)
Alkaline Phosphatase: 57 U/L (ref 39–117)
Indirect Bilirubin: 0.4 mg/dL (ref 0.0–0.9)
Total Protein: 6.8 g/dL (ref 6.0–8.3)

## 2012-03-13 NOTE — Telephone Encounter (Signed)
PLEASE INFORM HIM THAT LFT'S WERE NORMAL

## 2012-03-13 NOTE — Telephone Encounter (Signed)
Elijio notified

## 2012-03-15 ENCOUNTER — Telehealth: Payer: Self-pay | Admitting: Physical Medicine & Rehabilitation

## 2012-03-15 MED ORDER — TIZANIDINE HCL 4 MG PO TABS: ORAL_TABLET | ORAL | Status: DC

## 2012-03-15 NOTE — Telephone Encounter (Signed)
Tizanidine has no refills on it, needs refill.

## 2012-03-15 NOTE — Telephone Encounter (Signed)
Rx has been sent in, pt aware. 

## 2012-04-15 ENCOUNTER — Other Ambulatory Visit: Payer: Self-pay | Admitting: *Deleted

## 2012-04-15 MED ORDER — TIZANIDINE HCL 4 MG PO TABS: ORAL_TABLET | ORAL | Status: DC

## 2012-04-22 ENCOUNTER — Other Ambulatory Visit: Payer: Self-pay | Admitting: *Deleted

## 2012-04-22 MED ORDER — CLONAZEPAM 2 MG PO TABS: 2.0000 mg | ORAL_TABLET | Freq: Every evening | ORAL | Status: DC | PRN

## 2012-05-14 ENCOUNTER — Other Ambulatory Visit: Payer: Self-pay

## 2012-05-14 MED ORDER — TIZANIDINE HCL 4 MG PO TABS: ORAL_TABLET | ORAL | Status: DC

## 2012-06-13 ENCOUNTER — Telehealth: Payer: Self-pay | Admitting: Physical Medicine & Rehabilitation

## 2012-06-13 NOTE — Telephone Encounter (Signed)
Takes 10 mg Tizanidine at 6am, then 10 mg at 2pm, then 12 mg at 10 pm.  Can he make 12 mg everytime?Marland Kitchen

## 2012-06-13 NOTE — Telephone Encounter (Signed)
NO he can not, the max dose of tizanidine is 12mg  tid,which is 36mg  per day, he is taking 42mg  already !

## 2012-06-13 NOTE — Telephone Encounter (Signed)
Pt doesn't want to take 12mg  QID, he wants to take it TID. I advised him that was fine because that is the max dose per Clydie Braun.

## 2012-06-13 NOTE — Telephone Encounter (Signed)
David Kim wants to increase Tizanidine to 12 mg qid instead of 10 mg tid and 12 mg at hs.

## 2012-07-17 ENCOUNTER — Telehealth: Payer: Self-pay | Admitting: *Deleted

## 2012-07-17 NOTE — Telephone Encounter (Signed)
Lm advising patient to call and make appointment to have medication changed.

## 2012-07-17 NOTE — Telephone Encounter (Signed)
Needs to get RX changed and refills. Tizanidine 4 mg. Says he needs # 270 instead of #240 and directions 3 tab tid. (?)

## 2012-07-18 MED ORDER — TIZANIDINE HCL 4 MG PO TABS: 12.0000 mg | ORAL_TABLET | Freq: Three times a day (TID) | ORAL | Status: DC

## 2012-07-18 NOTE — Telephone Encounter (Signed)
Patient returned call

## 2012-07-18 NOTE — Telephone Encounter (Signed)
Rx has been changed. See telephone encounter from 06/13/12 to explain the change in medication dosage.  Pt aware that prescription has been sent in

## 2012-07-30 ENCOUNTER — Ambulatory Visit (INDEPENDENT_AMBULATORY_CARE_PROVIDER_SITE_OTHER): Payer: Federal, State, Local not specified - PPO | Admitting: Pulmonary Disease

## 2012-07-30 ENCOUNTER — Encounter: Payer: Self-pay | Admitting: Pulmonary Disease

## 2012-07-30 VITALS — BP 102/70 | HR 99 | Temp 98.4°F

## 2012-07-30 DIAGNOSIS — G4733 Obstructive sleep apnea (adult) (pediatric): Secondary | ICD-10-CM

## 2012-07-30 DIAGNOSIS — Z23 Encounter for immunization: Secondary | ICD-10-CM

## 2012-07-30 NOTE — Patient Instructions (Addendum)
Will give you the flu shot today. Stay on cpap, keep up with mask changes Will be happy to order a new cpap machine for you if allowed by insurance.  Let us know. Work on weight loss followup with me in one year.

## 2012-07-30 NOTE — Progress Notes (Signed)
  Subjective:    Patient ID: David Kim, male    DOB: February 11, 1946, 66 y.o.   MRN: [id number]  HPI The patient comes in today for followup of his known obstructive sleep apnea.  He is wearing CPAP compliantly by his download, and feels that he is doing well with no mask or pressure issues.  He is having some problems with insomnia and is trying to work through this.   Review of Systems  Constitutional: Negative for fever and unexpected weight change.  HENT: Negative for ear pain, nosebleeds, congestion, sore throat, rhinorrhea, sneezing, trouble swallowing, dental problem, postnasal drip and sinus pressure.   Eyes: Negative for redness and itching.  Respiratory: Negative for cough, chest tightness, shortness of breath and wheezing.   Cardiovascular: Negative for palpitations and leg swelling.  Gastrointestinal: Negative for nausea and vomiting.  Genitourinary: Negative for dysuria.  Musculoskeletal: Negative for joint swelling.  Skin: Negative for rash.  Neurological: Negative for headaches.  Hematological: Does not bruise/bleed easily.  Psychiatric/Behavioral: Positive for disturbed wake/sleep cycle. Negative for dysphoric mood. The patient is not nervous/anxious.        Objective:   Physical Exam Overweight male in no acute distress No skin breakdown or pressure necrosis from the CPAP mask Lower extremities with mild edema, no cyanosis Alert, does not appear to be sleepy, moves all 4 extremities.       Assessment & Plan:

## 2012-07-30 NOTE — Assessment & Plan Note (Signed)
The patient is wearing CPAP compliantly, and is having no significant issues with his mask fit or pressure.  He is due very soon for a new CPAP device, and once insurance allows, I will be happy to order this.  I have stressed to him the importance of trying to get 7 hours of sleep a night, and to work on weight reduction.  I have also given him some behavioral therapies to try for his insomnia, but suspect that he may need cognitive behavioral therapy with a behavioral specialist in order to resolve this.

## 2012-08-19 ENCOUNTER — Other Ambulatory Visit: Payer: Self-pay | Admitting: *Deleted

## 2012-08-19 MED ORDER — CLONAZEPAM 2 MG PO TABS: 2.0000 mg | ORAL_TABLET | Freq: Every evening | ORAL | Status: DC | PRN

## 2012-08-30 ENCOUNTER — Telehealth: Payer: Self-pay | Admitting: Pulmonary Disease

## 2012-08-30 NOTE — Telephone Encounter (Signed)
Spoke to pt [facility] told him if he needed new cpap he would order for him pt has decided his works ok for now will call back if it stops working [employer]

## 2012-09-17 ENCOUNTER — Other Ambulatory Visit: Payer: Self-pay

## 2012-10-01 ENCOUNTER — Other Ambulatory Visit: Payer: Self-pay | Admitting: *Deleted

## 2012-10-01 MED ORDER — CLONAZEPAM 2 MG PO TABS: 2.0000 mg | ORAL_TABLET | Freq: Every evening | ORAL | Status: DC | PRN

## 2012-10-31 ENCOUNTER — Other Ambulatory Visit: Payer: Self-pay | Admitting: Physical Medicine and Rehabilitation

## 2012-11-01 ENCOUNTER — Other Ambulatory Visit: Payer: Self-pay

## 2012-11-01 MED ORDER — CLONAZEPAM 2 MG PO TABS: 2.0000 mg | ORAL_TABLET | Freq: Every evening | ORAL | Status: DC | PRN

## 2012-12-11 ENCOUNTER — Other Ambulatory Visit: Payer: Self-pay | Admitting: Internal Medicine

## 2012-12-11 MED ORDER — LOSARTAN POTASSIUM 50 MG PO TABS: 50.0000 mg | ORAL_TABLET | Freq: Every day | ORAL | Status: DC

## 2012-12-11 NOTE — Addendum Note (Signed)
Addended by: Scharlene Gloss B on: 12/11/2012 10:33 AM   Modules accepted: Orders

## 2013-01-27 ENCOUNTER — Other Ambulatory Visit: Payer: Self-pay | Admitting: Physical Medicine and Rehabilitation

## 2013-01-28 NOTE — Telephone Encounter (Signed)
Tizanidine refill request.  High dose just wanted to clarify before refilling.

## 2013-01-29 ENCOUNTER — Other Ambulatory Visit: Payer: Self-pay | Admitting: Physical Medicine and Rehabilitation

## 2013-01-30 ENCOUNTER — Other Ambulatory Visit: Payer: Self-pay | Admitting: *Deleted

## 2013-01-30 MED ORDER — TIZANIDINE HCL 4 MG PO TABS: ORAL_TABLET | ORAL | Status: DC

## 2013-01-30 NOTE — Telephone Encounter (Signed)
Tizanidine refilled per electronic request.

## 2013-01-30 NOTE — Telephone Encounter (Signed)
Left message informed script had been sent to pharmacy.

## 2013-01-30 NOTE — Telephone Encounter (Signed)
Can prescribe for one month, then patient has to be seen

## 2013-02-26 ENCOUNTER — Other Ambulatory Visit: Payer: Self-pay | Admitting: Physical Medicine and Rehabilitation

## 2013-03-12 ENCOUNTER — Encounter: Payer: Federal, State, Local not specified - PPO | Admitting: Physical Medicine & Rehabilitation

## 2013-03-25 ENCOUNTER — Other Ambulatory Visit: Payer: Self-pay | Admitting: Physical Medicine and Rehabilitation

## 2013-04-04 ENCOUNTER — Other Ambulatory Visit (INDEPENDENT_AMBULATORY_CARE_PROVIDER_SITE_OTHER): Payer: Medicare Other

## 2013-04-04 ENCOUNTER — Encounter: Payer: Self-pay | Admitting: Internal Medicine

## 2013-04-04 ENCOUNTER — Ambulatory Visit (INDEPENDENT_AMBULATORY_CARE_PROVIDER_SITE_OTHER): Payer: Federal, State, Local not specified - PPO | Admitting: Internal Medicine

## 2013-04-04 VITALS — BP 112/78 | HR 73 | Temp 98.2°F | Wt 180.0 lb

## 2013-04-04 DIAGNOSIS — M7020 Olecranon bursitis, unspecified elbow: Secondary | ICD-10-CM

## 2013-04-04 DIAGNOSIS — I10 Essential (primary) hypertension: Secondary | ICD-10-CM

## 2013-04-04 DIAGNOSIS — F411 Generalized anxiety disorder: Secondary | ICD-10-CM

## 2013-04-04 DIAGNOSIS — M7021 Olecranon bursitis, right elbow: Secondary | ICD-10-CM | POA: Insufficient documentation

## 2013-04-04 DIAGNOSIS — Z8546 Personal history of malignant neoplasm of prostate: Secondary | ICD-10-CM

## 2013-04-04 DIAGNOSIS — G8220 Paraplegia, unspecified: Secondary | ICD-10-CM

## 2013-04-04 DIAGNOSIS — G4733 Obstructive sleep apnea (adult) (pediatric): Secondary | ICD-10-CM

## 2013-04-04 LAB — URINALYSIS, ROUTINE W REFLEX MICROSCOPIC
Bilirubin Urine: NEGATIVE
Ketones, ur: NEGATIVE
Nitrite: NEGATIVE
Total Protein, Urine: NEGATIVE
pH: 6 (ref 5.0–8.0)

## 2013-04-04 LAB — LIPID PANEL
Cholesterol: 119 mg/dL (ref 0–200)
LDL Cholesterol: 63 mg/dL (ref 0–99)
Triglycerides: 137 mg/dL (ref 0.0–149.0)
VLDL: 27.4 mg/dL (ref 0.0–40.0)

## 2013-04-04 LAB — BASIC METABOLIC PANEL
BUN: 14 mg/dL (ref 6–23)
Creatinine, Ser: 0.8 mg/dL (ref 0.4–1.5)
GFR: 106.12 mL/min (ref >60.00)

## 2013-04-04 LAB — CBC WITH DIFFERENTIAL/PLATELET
Basophils Relative: 0.3 % (ref 0.0–3.0)
Eosinophils Relative: 2 % (ref 0.0–5.0)
HCT: 47.8 % (ref 39.0–52.0)
Hemoglobin: 16.1 g/dL (ref 13.0–17.0)
Lymphs Abs: 1.9 10*3/uL (ref 0.7–4.0)
MCV: 88.8 fl (ref 78.0–100.0)
Monocytes Absolute: 0.7 10*3/uL (ref 0.1–1.0)
RBC: 5.38 Mil/uL (ref 4.22–5.81)
WBC: 8.3 10*3/uL (ref 4.5–10.5)

## 2013-04-04 LAB — HEPATIC FUNCTION PANEL: Total Bilirubin: 0.5 mg/dL (ref 0.3–1.2)

## 2013-04-04 NOTE — Progress Notes (Signed)
Subjective:    Patient ID: David Kim, male    DOB: Mar 13, 1946, 67 y.o.   MRN: [id number]  HPI Here to f/u; overall doing ok,  Pt denies chest pain, increased sob or doe, wheezing, orthopnea, PND, palpitations, dizziness or syncope, though has noticed persistent trace LE swelling for at least 6 mo.  Pt denies polydipsia, polyuria, or low sugar symptoms such as weakness or confusion improved with po intake.  Pt denies new neurological symptoms such as new headache, or facial or extremity weakness or numbness.   Pt states overall good compliance with meds, has been trying to follow lower cholesterol diet, with wt overall stable. Denies worsening depressive symptoms, suicidal ideation, or panic; has ongoing anxiety. No recent gout flares.  Denies worsening reflux, abd pain, dysphagia, n/v, bowel change or blood.  Needs ? note for insurance purposes today per pt to state he has OSA and needs mask and supplies (to [facility]) to which i agree.  Also needs new wheelchair, current is 67 yo and right arm broken, and cushion no longer working well.  Pt remains insensate to LE's, wheelchair bound Past Medical History  Diagnosis Date  . Low back pain   . Gout   . IBS (irritable bowel syndrome)   . Peptic ulcer disease   . Allergic rhinitis   . Depression   . Depression   . Anxiety   . Erectile dysfunction   . OSA (obstructive sleep apnea)   . GERD (gastroesophageal reflux disease)   . Prostate cancer   . Hypertension   . Carpal tunnel syndrome     right  . Fatigue   . Paraplegia 04/23/2009    Qualifier: Diagnosis of  By: Jonny Ruiz MD, Len Blalock   . PROSTATE CANCER, HX OF 05/23/2007    Qualifier: Diagnosis of  By: Jonny Ruiz MD, Len Blalock   . 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 Thoracic spinal cord injury 03/12/2012  . ALLERGIC RHINITIS 10/01/2007    Qualifier: Diagnosis of  By: Jonny Ruiz MD, Len Blalock   . HYPOGONADISM 03/31/2008    Qualifier: Diagnosis of  By: Jonny Ruiz  MD, Len Blalock   . PEPTIC ULCER DISEASE 10/01/2007    Qualifier: Diagnosis of  By: Jonny Ruiz MD, Len Blalock    Past Surgical History  Procedure Laterality Date  . Prostatectomy    . Inguinal heniorrhaphy    . Tonsillectomy    . Pilonidal cyst / sinus excision    . Left leg surgery after fibula      reports that he has never smoked. He has never used smokeless tobacco. His alcohol and drug histories are not on file. family history includes Diabetes in an unspecified family member. Allergies  Allergen Reactions  . Amoxicillin   . Endal Hd    Current Outpatient Prescriptions on File Prior to Visit  Medication Sig Dispense Refill  . aspirin (ECOTRIN LOW STRENGTH) 81 MG EC tablet Take 81 mg by mouth daily.        . baclofen (LIORESAL) 20 MG tablet Take 20 mg by mouth every 6 (six) hours.        . clonazePAM (KLONOPIN) 2 MG tablet Take 1 tablet (2 mg total) by mouth at bedtime as needed for anxiety. 1/2 by mouth in the am as well  45 tablet  5  . losartan (COZAAR) 50 MG tablet Take 1 tablet (50 mg total) by mouth daily.  90  tablet  3  . oxybutynin (DITROPAN) 5 MG tablet Take 5 mg by mouth 4 (four) times daily.        Marland Kitchen senna (SENOKOT) 8.6 MG tablet Take 2 tablets by mouth at bedtime as needed.       Marland Kitchen tiZANidine (ZANAFLEX) 4 MG tablet take 3 tablets by mouth three times a day  270 tablet  11   No current facility-administered medications on file prior to visit.   Review of Systems  Constitutional: Negative for unexpected weight change, or unusual diaphoresis  HENT: Negative for tinnitus.   Eyes: Negative for photophobia and visual disturbance.  Respiratory: Negative for choking and stridor.   Gastrointestinal: Negative for vomiting and blood in stool.  Genitourinary: Negative for hematuria and decreased urine volume.  Musculoskeletal: Negative for acute joint swelling Skin: Negative for color change and wound.  Neurological: Negative for tremors and numbness other than noted   Psychiatric/Behavioral: Negative for decreased concentration or  hyperactivity.       Objective:   Physical Exam BP 112/78  Pulse 73  Temp(Src) 98.2 F (36.8 C) (Oral)  Wt 180 lb (81.647 kg)  BMI 28.19 kg/m2  SpO2 97% VS noted, seated/belted in wheelchair Constitutional: Pt appears well-developed and well-nourished.  HENT: Head: NCAT.  Right Ear: External ear normal.  Left Ear: External ear normal.  Eyes: Conjunctivae and EOM are normal. Pupils are equal, round, and reactive to light.  Neck: Normal range of motion. Neck supple.  Cardiovascular: Normal rate and regular rhythm.   Pulmonary/Chest: Effort normal and breath sounds normal.  Abd:  Soft,  non-distended, + BS Right elbow with mild bursa tender/swelling Neurological: Pt is alert. Not confused , paraplegic Skin: Skin is warm. No erythema. trace LE edema to knees Psychiatric: Pt behavior is normal. Thought content normal. +mild dysphoric, mild nervous    Assessment & Plan:

## 2013-04-04 NOTE — Patient Instructions (Signed)
Please continue all other medications as before, and refills have been done if requested. Please have the pharmacy call with any other refills you may need. We will fax the order for the CPAP mask and supplies, as well as the Wheelchair (and today's note) to the fax number you provided today for [facility] Please go to the LAB in the Basement (turn left off the elevator) for the tests to be done today You will be contacted by phone if any changes need to be made immediately.  Otherwise, you will receive a letter about your results with an explanation  Please remember to sign up for My Chart if you have not done so, as this will be important to you in the future with finding out test results, communicating by private email, and scheduling acute appointments online when needed.  Please return in 6 months, or sooner if needed

## 2013-04-05 ENCOUNTER — Encounter: Payer: Self-pay | Admitting: Internal Medicine

## 2013-04-05 NOTE — Assessment & Plan Note (Addendum)
stable overall by history and exam, recent data reviewed with pt, and pt to continue medical treatment as before,  to f/u any worsening symptoms or concerns BP Readings from Last 3 Encounters:  04/04/13 112/78  07/30/12 102/70  03/12/12 114/73

## 2013-04-05 NOTE — Assessment & Plan Note (Signed)
Ok for mask and supplies, order to send to [facility], wife states for some reason for insurance purpose this has to come from the PCP

## 2013-04-05 NOTE — Assessment & Plan Note (Signed)
Mild, prob traumatic, d/w pt and wife  - for conservative tx, tylenol prn, avoid further pressure or trauma

## 2013-04-05 NOTE — Assessment & Plan Note (Signed)
Also for PSA as he is due 

## 2013-04-05 NOTE — Assessment & Plan Note (Signed)
stable overall by history and exam, recent data reviewed with pt, and pt to continue medical treatment as before,  to f/u any worsening symptoms or concerns Lab Results  Component Value Date   WBC 8.3 04/04/2013   HGB 16.1 04/04/2013   HCT 47.8 04/04/2013   PLT 231.0 04/04/2013   GLUCOSE 102* 04/04/2013   CHOL 119 04/04/2013   TRIG 137.0 04/04/2013   HDL 28.40* 04/04/2013   LDLDIRECT 88.3 10/20/2009   LDLCALC 63 04/04/2013   ALT 20 04/04/2013   AST 23 04/04/2013   NA 139 04/04/2013   K 4.2 04/04/2013   CL 107 04/04/2013   CREATININE 0.8 04/04/2013   BUN 14 04/04/2013   CO2 28 04/04/2013   TSH 1.93 04/04/2013   PSA 0.04* 04/04/2013

## 2013-04-05 NOTE — Assessment & Plan Note (Addendum)
Ok for order for new wheelchair  Note:  Total time for pt hx, exam, review of record with pt in the room, determination of diagnoses and plan for further eval and tx is > 40 min, with over 50% spent in coordination and counseling of patient

## 2013-04-07 ENCOUNTER — Telehealth: Payer: Self-pay

## 2013-04-07 NOTE — Telephone Encounter (Signed)
Patient informed. 

## 2013-04-07 NOTE — Telephone Encounter (Signed)
Message copied by Pincus Sanes on Mon Apr 07, 2013 11:00 AM ------      Message from: Corwin Levins      Created: Fri Apr 04, 2013  5:54 PM      Regarding: dizzy/BP       Unfortunately this is likely related to neurological damage he has had, and may not improve, but it should be kept in mind, as overmedication for BP or dehydration can make this worse      ----- Message -----         From: Vladimir Crofts Naphtali Zywicki         Sent: 04/04/2013   3:59 PM           To: Corwin Levins, MD            The patient stated his BP at times goes up to 160/100 then low enough he almost passes out.  Please advise       ------

## 2013-04-30 DIAGNOSIS — H52229 Regular astigmatism, unspecified eye: Secondary | ICD-10-CM | POA: Diagnosis not present

## 2013-04-30 DIAGNOSIS — H524 Presbyopia: Secondary | ICD-10-CM | POA: Diagnosis not present

## 2013-04-30 DIAGNOSIS — H5210 Myopia, unspecified eye: Secondary | ICD-10-CM | POA: Diagnosis not present

## 2013-04-30 DIAGNOSIS — H2510 Age-related nuclear cataract, unspecified eye: Secondary | ICD-10-CM | POA: Diagnosis not present

## 2013-05-01 ENCOUNTER — Telehealth: Payer: Self-pay | Admitting: *Deleted

## 2013-05-01 NOTE — Telephone Encounter (Signed)
Pt called with questions about bills he have received from [facility].  Forwarded pt to pt billing at [phone].

## 2013-05-02 ENCOUNTER — Telehealth: Payer: Self-pay

## 2013-05-02 MED ORDER — CLONAZEPAM 2 MG PO TABS: 2.0000 mg | ORAL_TABLET | Freq: Every evening | ORAL | Status: DC | PRN

## 2013-05-02 NOTE — Telephone Encounter (Signed)
Patient request clonazepam refill.

## 2013-05-02 NOTE — Telephone Encounter (Signed)
Patient called requesting clonazepam be called to [facility].  Left message for patient to call office to make his yearly appointment for refills.

## 2013-05-02 NOTE — Telephone Encounter (Signed)
Patient called again regarding refill and appointment.

## 2013-05-02 NOTE — Telephone Encounter (Signed)
Phone in rx refilled

## 2013-05-05 ENCOUNTER — Encounter: Payer: Medicare Other | Attending: Physical Medicine & Rehabilitation | Admitting: Physical Medicine & Rehabilitation

## 2013-05-05 ENCOUNTER — Encounter: Payer: Self-pay | Admitting: Physical Medicine & Rehabilitation

## 2013-05-05 VITALS — BP 122/69 | HR 69 | Resp 16 | Ht 68.0 in | Wt 180.0 lb

## 2013-05-05 DIAGNOSIS — Z79899 Other long term (current) drug therapy: Secondary | ICD-10-CM | POA: Diagnosis not present

## 2013-05-05 DIAGNOSIS — G839 Paralytic syndrome, unspecified: Secondary | ICD-10-CM

## 2013-05-05 DIAGNOSIS — R339 Retention of urine, unspecified: Secondary | ICD-10-CM | POA: Insufficient documentation

## 2013-05-05 DIAGNOSIS — G8220 Paraplegia, unspecified: Secondary | ICD-10-CM | POA: Diagnosis not present

## 2013-05-05 DIAGNOSIS — I10 Essential (primary) hypertension: Secondary | ICD-10-CM | POA: Diagnosis not present

## 2013-05-05 DIAGNOSIS — IMO0002 Reserved for concepts with insufficient information to code with codable children: Secondary | ICD-10-CM

## 2013-05-05 DIAGNOSIS — S24109S Unspecified injury at unspecified level of thoracic spinal cord, sequela: Secondary | ICD-10-CM

## 2013-05-05 NOTE — Patient Instructions (Signed)
DITROPAN:  TRY ONE TAB 3X DAILY FOR 2 WEEKS.----  IF NO PROBLEMS, THEN DECREASE TO 2X DAILY FOR THE NEXT 2 WEEKS----  IF NO PROBLEMS CHANGE TO ONCE DAILY FOR 2  MORE WEEKS------  IF NO PROBLEMS THEREAFTER, THEN STOP

## 2013-05-05 NOTE — Telephone Encounter (Signed)
Clonazepam called in.  Left message informing patient.

## 2013-05-05 NOTE — Progress Notes (Signed)
Subjective:    Patient ID: David Kim, male    DOB: September 23, 1946, 67 y.o.   MRN: [id number]  HPI  Percival is back regarding his T10 SCI . His spasticity is still present. He remains on his prior spasticity medications although he adjusted the dose of his tizanidine down a bit. Marland Kitchen HIs bladder remains an issue. He feels the urge to go but has a hard time emptying. He asks if it's the oxybutynin which is contributing to this.   He remains on an every day bowel program. He sometimes using miralax.   He and his wife do ROM exercises each day. He still gets up on his standing frame.    Pain Inventory Average Pain 0 Pain Right Now 0 My pain is na  In the last 24 hours, has pain interfered with the following? General activity 0 Relation with others 0 Enjoyment of life 0 What TIME of day is your pain at its worst? na Sleep (in general) NA  Pain is worse with: na Pain improves with: na Relief from Meds: na  Mobility ability to climb steps?  no do you drive?  no use a wheelchair transfers alone Do you have any goals in this area?  yes  Function disabled: date disabled 02-12-2009 I need assistance with the following:  bathing, toileting, meal prep, household duties and shopping Do you have any goals in this area?  yes  Neuro/Psych bladder control problems weakness numbness trouble walking spasms  Prior Studies Any changes since last visit?  no  Physicians involved in your care Any changes since last visit?  no   Family History  Problem Relation Age of Onset  . Diabetes     History   Social History  . Marital Status: Married    Spouse Name: N/A    Number of Children: N/A  . Years of Education: N/A   Occupational History  . retired    Social History Main Topics  . Smoking status: Never Smoker   . Smokeless tobacco: Never Used  . Alcohol Use: None  . Drug Use: None  . Sexually Active: None   Other Topics Concern  . None   Social History Narrative  .  None   Past Surgical History  Procedure Laterality Date  . Prostatectomy    . Inguinal heniorrhaphy    . Tonsillectomy    . Pilonidal cyst / sinus excision    . Left leg surgery after fibula     Past Medical History  Diagnosis Date  . Low back pain   . Gout   . IBS (irritable bowel syndrome)   . Peptic ulcer disease   . Allergic rhinitis   . Depression   . Depression   . Anxiety   . Erectile dysfunction   . OSA (obstructive sleep apnea)   . GERD (gastroesophageal reflux disease)   . Prostate cancer   . Hypertension   . Carpal tunnel syndrome     right  . Fatigue   . Paraplegia 04/23/2009    Qualifier: Diagnosis of  By: Jonny Ruiz MD, Len Blalock   . PROSTATE CANCER, HX OF 05/23/2007    Qualifier: Diagnosis of  By: Jonny Ruiz MD, Len Blalock   . 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 Thoracic spinal cord injury 03/12/2012  . ALLERGIC RHINITIS 10/01/2007    Qualifier: Diagnosis of  By: Jonny Ruiz MD, Len Blalock   . HYPOGONADISM 03/31/2008  Qualifier: Diagnosis of  By: Jonny Ruiz MD, Len Blalock   . PEPTIC ULCER DISEASE 10/01/2007    Qualifier: Diagnosis of  By: Jonny Ruiz MD, Len Blalock    BP 122/69  Pulse 69  Resp 16  Ht 5\' 8"  (1.727 m)  Wt 180 lb (81.647 kg)  BMI 27.38 kg/m2  SpO2 97%     Review of Systems  Respiratory: Positive for apnea.   Cardiovascular: Positive for leg swelling.  Gastrointestinal: Positive for constipation.  Genitourinary:       Urine retention  Musculoskeletal: Positive for gait problem.  Neurological: Positive for weakness and numbness.       Spasms  All other systems reviewed and are negative.       Objective:   Physical Exam Constitutional: He appears well-developed and well-nourished.  HENT:  Head: Normocephalic and atraumatic.  Eyes: Conjunctivae and EOM are normal. Pupils are equal, round, and reactive to light.  Neck: Normal range of motion. Neck supple.  Cardiovascular: Normal rate and regular rhythm.  Pulmonary/Chest:  Effort normal and breath sounds normal.  Neurological:  No volitional movement in the legs. He uses his hips and trunk to help move his legs. He has no sensation below the level of injury. He has 0 to minimal resting tone in the legs. DTR's are hyperactive. He has sustained clonus in the right ankle. UE strength is normal.   Sitting posture is good.  Skin:    large area of scar over the medial thigh which has nicely healed.  Psychiatric: He has a normal mood and affect. His behavior is normal. Judgment and thought content normal.    Assessment & Plan:   ASSESSMENT:  1. 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  2. Hypertension.  3. Left thigh wound which appears healed and is scarred over.    PLAN:  1. The patient is doing very well with his spasticity management. We  will continue with his baclofen, tizanidine, and Klonopin. These are working well for his spasticity and sleep. Refills were provided. He is doing a good job with his exercise and ROM program.  2. We discussed some tapering of his ditropan to see if it helps to improve the emptying of his bladder. Conversely, I don't want his bladder emptying incontinently every 2 hours.  Time was spent educating the patient on the spastic bladder.  3. Continue with standing frame and home exercises. He and his wife remain meticulous.  4. We will continue with yearly follow up.   5. 30 minutes of face to face patient care time were spent during this visit. All questions were encouraged and answered.

## 2013-05-21 DIAGNOSIS — N319 Neuromuscular dysfunction of bladder, unspecified: Secondary | ICD-10-CM | POA: Diagnosis not present

## 2013-05-21 DIAGNOSIS — Z8546 Personal history of malignant neoplasm of prostate: Secondary | ICD-10-CM | POA: Diagnosis not present

## 2013-05-21 DIAGNOSIS — N318 Other neuromuscular dysfunction of bladder: Secondary | ICD-10-CM | POA: Diagnosis not present

## 2013-05-21 DIAGNOSIS — N3946 Mixed incontinence: Secondary | ICD-10-CM | POA: Diagnosis not present

## 2013-05-25 ENCOUNTER — Other Ambulatory Visit: Payer: Self-pay | Admitting: Neurology

## 2013-07-09 ENCOUNTER — Ambulatory Visit (INDEPENDENT_AMBULATORY_CARE_PROVIDER_SITE_OTHER): Payer: Medicare Other

## 2013-07-09 DIAGNOSIS — Z23 Encounter for immunization: Secondary | ICD-10-CM

## 2013-07-24 ENCOUNTER — Other Ambulatory Visit: Payer: Self-pay | Admitting: Nurse Practitioner

## 2013-08-01 DIAGNOSIS — Z85828 Personal history of other malignant neoplasm of skin: Secondary | ICD-10-CM | POA: Diagnosis not present

## 2013-08-01 DIAGNOSIS — L570 Actinic keratosis: Secondary | ICD-10-CM | POA: Diagnosis not present

## 2013-08-01 DIAGNOSIS — D1801 Hemangioma of skin and subcutaneous tissue: Secondary | ICD-10-CM | POA: Diagnosis not present

## 2013-08-01 DIAGNOSIS — L821 Other seborrheic keratosis: Secondary | ICD-10-CM | POA: Diagnosis not present

## 2013-08-05 ENCOUNTER — Encounter: Payer: Self-pay | Admitting: Pulmonary Disease

## 2013-08-05 ENCOUNTER — Ambulatory Visit (INDEPENDENT_AMBULATORY_CARE_PROVIDER_SITE_OTHER): Payer: Medicare Other | Admitting: Pulmonary Disease

## 2013-08-05 VITALS — BP 104/70 | HR 63 | Temp 97.5°F

## 2013-08-05 DIAGNOSIS — G4733 Obstructive sleep apnea (adult) (pediatric): Secondary | ICD-10-CM

## 2013-08-05 NOTE — Assessment & Plan Note (Signed)
The patient seems to be doing very well with CPAP, and feels that he is sleeping satisfactory with the device has adequate daytime alertness.  He is asking about a new CPAP machine, and we will have to check with his insurance to see if he is eligible.  I've encouraged him to work on modest weight loss, and to keep up with his mask changes and supplies.

## 2013-08-05 NOTE — Progress Notes (Signed)
  Subjective:    Patient ID: David Kim, male    DOB: 1946-01-16, 67 y.o.   MRN: [id number]  HPI The patient comes in today for followup of his known obstructive sleep apnea.  He is wearing CPAP compliantly, and feels that he is sleeping well with the device.  He thinks that he has adequate daytime alertness.  He is in need of a new CPAP mask at this time, and is also asking about a replacement machine.   Review of Systems  Constitutional: Negative for fever and unexpected weight change.  HENT: Negative for ear pain, nosebleeds, congestion, sore throat, rhinorrhea, sneezing, trouble swallowing, dental problem, postnasal drip and sinus pressure.   Eyes: Negative for redness and itching.  Respiratory: Negative for cough, chest tightness, shortness of breath and wheezing.   Cardiovascular: Negative for palpitations and leg swelling.  Gastrointestinal: Negative for nausea and vomiting.  Genitourinary: Negative for dysuria.  Musculoskeletal: Negative for joint swelling.  Skin: Negative for rash.  Neurological: Negative for headaches.  Hematological: Does not bruise/bleed easily.  Psychiatric/Behavioral: Negative for dysphoric mood. The patient is not nervous/anxious.        Objective:   Physical Exam Overweight male in no acute distress Nose without purulence or discharge noted Neck without lymphadenopathy or thyromegaly No skin breakdown or pressure necrosis from the CPAP mask Lower extremities with 1+ edema bilaterally, no cyanosis Alert, does not appear to be sleepy, moves upper extremities without deficit       Assessment & Plan:

## 2013-08-05 NOTE — Patient Instructions (Addendum)
Continue with cpap.  Will see if you qualify for a new cpap machine. Keep up with mask changes. Work on modest weight loss.  followup with me in one year if doing well.

## 2013-08-06 ENCOUNTER — Telehealth: Payer: Self-pay | Admitting: Pulmonary Disease

## 2013-08-06 NOTE — Telephone Encounter (Signed)
I spoke with David Kim. She stated [facility] wrote order for S10 if pt eligible for new machine. This is on back order and could take up to 2-3 weeks to get the machine in. She wants to know if [facility] wants pt to set up for S9 instead? Per last OV note pt was having machine malfunctions. Please advise KC thanks

## 2013-08-06 NOTE — Telephone Encounter (Signed)
I spoke with pt and he stated he would like to wait for the s10. I called and made [facility] aware as well. Nothing further needed

## 2013-08-06 NOTE — Telephone Encounter (Signed)
Let the pt know this, and he can either get the s9 or wait on s10.  Advantage of s10 is that it has built in modem where they can get downloads and make adjustment to machine wirelessly without him bringing machine to them.  If the pt is happy, I am happy

## 2013-08-14 ENCOUNTER — Encounter: Payer: Self-pay | Admitting: Neurology

## 2013-08-14 ENCOUNTER — Ambulatory Visit (INDEPENDENT_AMBULATORY_CARE_PROVIDER_SITE_OTHER): Payer: Medicare Other | Admitting: Neurology

## 2013-08-14 DIAGNOSIS — S24109S Unspecified injury at unspecified level of thoracic spinal cord, sequela: Secondary | ICD-10-CM

## 2013-08-14 DIAGNOSIS — G8220 Paraplegia, unspecified: Secondary | ICD-10-CM

## 2013-08-14 MED ORDER — TIZANIDINE HCL 4 MG PO TABS: 8.0000 mg | ORAL_TABLET | Freq: Four times a day (QID) | ORAL | Status: DC | PRN

## 2013-08-14 MED ORDER — BACLOFEN 10 MG PO TABS: 10.0000 mg | ORAL_TABLET | Freq: Four times a day (QID) | ORAL | Status: DC

## 2013-08-14 MED ORDER — BACLOFEN 5 MG HALF TABLET: 5.0000 mg | ORAL_TABLET | Freq: Four times a day (QID) | ORAL | Status: DC

## 2013-08-14 MED ORDER — CLONAZEPAM 2 MG PO TABS: 2.0000 mg | ORAL_TABLET | Freq: Every evening | ORAL | Status: DC | PRN

## 2013-08-14 MED ORDER — BACLOFEN 20 MG PO TABS: 20.0000 mg | ORAL_TABLET | Freq: Four times a day (QID) | ORAL | Status: DC

## 2013-08-14 NOTE — Progress Notes (Signed)
History of Present Illness:  David Kim came in to refill his medications, he is accompanied by his wife, sitting wheelchair  David Kim developed weakness in his legs in 2009. He slipped on ice in January 2009 and fractures ankles requiring pinning procedures. Following that time he developed progressive leg weakness. 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 It became progressively less safe for him to do that. He began to experience difficulty getting out of a car. He had MRI imaging studies of his head, cervical, and lumbar spine.  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 Following the procedure, the patient became paraplegic, a hematoma was found in the operative site when he was reopened. He did not regain his ability to move his legs which he had been able to do preoperatively.  He is a sensory level below the umbilicus. He has neurogenic bowel and bladder and wears diapers all the time. Occasional UTI. He was evaluated by Dr. Sharene Skeans, deemed not a baclofen candidate due to nonambulatory, no significant pain.  The patient has spasticity in his lower extremities and his torso, but he is not experiencing pain when he has spasms. The spasticity does not awaken him at night time. His tone has not increased to the point where it is difficult to dress him, perform hygiene, or to place him in his wheelchair.  He self transfer himself in and out of wheelchair.  He is on both baclofen 20+10mg + 5mg  qid and tizanidine 4mg  2 tabs q6hours, clonazepam 2mg  1/2 qam, one qhs. He complains of dry mouth, also taking oxybutynin, but no excessive drowsiness, wife has been stretching his legs on daily basis.   He has no significant bilateral lower extremity movement, sensory level from umbilical down no significant pain,,  Review of Systems  Out of a complete 14 system review, the patient complains of  only the following symptoms, and all other reviewed systems are negative.  Swelling in legs,  ringing ears, feeling hot, feeling cold, confusion, numbness, slurred speech, dizziness, insomnia, not enough sleep.  Social History  He is retired from the [employer]. He lives at home with his wife. He denies any history of tobacco, alcohol, or illicit drug use. Inhaled Tobacco Use: never smoker  Family History Father with heart disease and diabetes. Mother died at the age of 55 on Nov 02, 2009 from a stroke  Past Medical History  Spinal cord tumor with resultant paraplegia as above. History of prostate cancer. Hypertension, on medications.  Surgical History  Thoracic spine surgery for spinal cord tumor removal in April 2010 as above. Also prostate surgery,  left ankle surgery.   Physical Exam  General: alert, well developed, well nourished, in no acute distress Neck: supple no carotid bruits Respiratory: clear to auscultation bilaterally Cardiovascular: regular rate rhythm Skin: no rashes   Neurologic Exam  Mental Status: mild obese, in wheel chair Cranial Nerves: CN II-XII pupils were equal round reactive to light.    Extraocular movements were full.  Visual fields were full on confrontational test.  Facial sensation and strength were normal.  Hearing was intact to finger rubbing bilaterally.  Uvula tongue were midline.  Head turning and shoulder shrugging were normal and symmetric.  Tongue protrusion into the cheeks strength were normal.  Motor: very strong upper extremity muscles, normal tone, mild to moderate spasticity of lower extremity, minimal hip flexion movement, mild bilateral knee  contraction to 175 degree, ankle plantar flexion. Sensory: sensory level to T10 bilaterally. Coordination: good finger-to-nose, rapid repetitive alternating movements and finger apposition   Gait and Station: can not bear weight Reflexes: Deep tendon reflexes: Biceps: 2/2, Brachioradialis:  2/2, Triceps: 2/2, Pateller: 2+/2+, ankle clonus  Plantar responses are flexor.   Assessment and Plan:    s/p T10 ependymoma, now with spastic paraplegia, wheelchair bound  He is not ambulatory, no significant pain, spasticity does not pose signficant limitation on his care, will call in RX for baclofen, 20+10+5mg  qid, tizanidine 4mg  2 tabs qid, clonazepam 2mg  1/2 qam, one qhs.   Return to clinic in one year.

## 2013-09-22 DIAGNOSIS — D239 Other benign neoplasm of skin, unspecified: Secondary | ICD-10-CM | POA: Diagnosis not present

## 2013-09-22 DIAGNOSIS — L570 Actinic keratosis: Secondary | ICD-10-CM | POA: Diagnosis not present

## 2013-09-22 DIAGNOSIS — Z85828 Personal history of other malignant neoplasm of skin: Secondary | ICD-10-CM | POA: Diagnosis not present

## 2013-10-08 ENCOUNTER — Ambulatory Visit: Payer: Medicare Other | Admitting: Internal Medicine

## 2013-10-14 ENCOUNTER — Ambulatory Visit (INDEPENDENT_AMBULATORY_CARE_PROVIDER_SITE_OTHER): Payer: Medicare Other | Admitting: Internal Medicine

## 2013-10-14 ENCOUNTER — Encounter: Payer: Self-pay | Admitting: Internal Medicine

## 2013-10-14 VITALS — BP 130/80 | HR 67 | Temp 98.3°F | Wt 180.0 lb

## 2013-10-14 DIAGNOSIS — F329 Major depressive disorder, single episode, unspecified: Secondary | ICD-10-CM

## 2013-10-14 DIAGNOSIS — G8220 Paraplegia, unspecified: Secondary | ICD-10-CM

## 2013-10-14 DIAGNOSIS — I10 Essential (primary) hypertension: Secondary | ICD-10-CM | POA: Diagnosis not present

## 2013-10-14 NOTE — Progress Notes (Signed)
Pre-visit discussion using our clinic review tool. No additional management support is needed unless otherwise documented below in the visit note.  

## 2013-10-14 NOTE — Assessment & Plan Note (Signed)
stable overall by history and exam, recent data reviewed with pt, and pt to continue medical treatment as before,  to f/u any worsening symptoms or concerns BP Readings from Last 3 Encounters:  10/14/13 130/80  08/05/13 104/70  05/05/13 122/69

## 2013-10-14 NOTE — Progress Notes (Signed)
Subjective:    Patient ID: David Kim, male    DOB: Jan 05, 1946, 67 y.o.   MRN: [id number]  HPI  Here to f/u, has seen Dr Almyra Brace and neuro/Dr Terrace Arabia now improved spascticity.  overall doing ok,  Pt denies chest pain, increased sob or doe, wheezing, orthopnea, PND, increased LE swelling, palpitations, dizziness or syncope.  Pt denies polydipsia, polyuria, or low sugar symptoms such as weakness or confusion improved with po intake.  Pt denies new neurological symptoms such as new headache, or facial or extremity weakness or numbness.   Pt states overall good compliance with meds, has been trying to follow lower cholesterol diet.  Denies worsening depressive symptoms, suicidal ideation, or panic.  Right elbow pain resolved from last visit.  May have more feeling to left foot? , can still move right toe. No new complaints Past Medical History  Diagnosis Date  . Low back pain   . Gout   . IBS (irritable bowel syndrome)   . Peptic ulcer disease   . Allergic rhinitis   . Depression   . Depression   . Anxiety   . Erectile dysfunction   . OSA (obstructive sleep apnea)   . GERD (gastroesophageal reflux disease)   . Prostate cancer   . Hypertension   . Carpal tunnel syndrome     right  . Fatigue   . Paraplegia 04/23/2009    Qualifier: Diagnosis of  By: Jonny Ruiz MD, Len Blalock   . PROSTATE CANCER, HX OF 05/23/2007    Qualifier: Diagnosis of  By: Jonny Ruiz MD, Len Blalock   . 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 Thoracic spinal cord injury 03/12/2012  . ALLERGIC RHINITIS 10/01/2007    Qualifier: Diagnosis of  By: Jonny Ruiz MD, Len Blalock   . HYPOGONADISM 03/31/2008    Qualifier: Diagnosis of  By: Jonny Ruiz MD, Len Blalock   . PEPTIC ULCER DISEASE 10/01/2007    Qualifier: Diagnosis of  By: Jonny Ruiz MD, Len Blalock    Past Surgical History  Procedure Laterality Date  . Prostatectomy    . Inguinal heniorrhaphy    . Tonsillectomy    . Pilonidal cyst / sinus excision    . Left leg  surgery after fibula      reports that he has never smoked. He has never used smokeless tobacco. He reports that he does not drink alcohol or use illicit drugs. family history includes Dementia in his mother; Diabetes in his father; High blood pressure in his mother. Allergies  Allergen Reactions  . Amoxicillin   . Endal Hd    Current Outpatient Prescriptions on File Prior to Visit  Medication Sig Dispense Refill  . aspirin (ECOTRIN LOW STRENGTH) 81 MG EC tablet Take 81 mg by mouth daily.        . baclofen (LIORESAL) 10 MG tablet Take 1 tablet (10 mg total) by mouth 4 (four) times daily. (40mg )  125 each  12  . baclofen (LIORESAL) 20 MG tablet Take 1 tablet (20 mg total) by mouth 4 (four) times daily. (80mg )  125 each  12  . baclofen (LIORESAL) 5 mg TABS tablet Take 0.5 tablets (5 mg total) by mouth 4 (four) times daily. (20mg )  125 tablet  12  . clonazePAM (KLONOPIN) 2 MG tablet Take 1 tablet (2 mg total) by mouth at bedtime as needed for anxiety. 1/2 by mouth in the am as well  45 tablet  5  .  losartan (COZAAR) 50 MG tablet Take 1 tablet (50 mg total) by mouth daily.  90 tablet  3  . oxybutynin (DITROPAN) 5 MG tablet Take 5 mg by mouth 4 (four) times daily.       Marland Kitchen senna (SENOKOT) 8.6 MG tablet Take 2 tablets by mouth at bedtime as needed.       Marland Kitchen tiZANidine (ZANAFLEX) 4 MG tablet Take 2 tablets (8 mg total) by mouth every 6 (six) hours as needed.  270 tablet  12   No current facility-administered medications on file prior to visit.   Review of Systems  Constitutional: Negative for unexpected weight change, or unusual diaphoresis  HENT: Negative for tinnitus.   Eyes: Negative for photophobia and visual disturbance.  Respiratory: Negative for choking and stridor.   Gastrointestinal: Negative for vomiting and blood in stool.  Genitourinary: Negative for hematuria and decreased urine volume.  Musculoskeletal: Negative for acute joint swelling Skin: Negative for color change and wound.    Neurological: Negative for tremors and numbness other than noted  Psychiatric/Behavioral: Negative for decreased concentration or  hyperactivity.       Objective:   Physical Exam BP 130/80  Pulse 67  Temp(Src) 98.3 F (36.8 C) (Oral)  Wt 180 lb (81.647 kg)  SpO2 97% VS noted, not ill appearing Constitutional: Pt appears well-developed and well-nourished.  HENT: Head: NCAT.  Right Ear: External ear normal.  Left Ear: External ear normal.  Eyes: Conjunctivae and EOM are normal. Pupils are equal, round, and reactive to light.  Neck: Normal range of motion. Neck supple.  Cardiovascular: Normal rate and regular rhythm.   Pulmonary/Chest: Effort normal and breath sounds normal.  Abd:  Soft, non-distended, + BS Neurological: Pt is alert. Not confused , t10 sensory level noted Skin: Skin is warm. No erythema.  Psychiatric: Pt behavior is normal. Thought content normal. not depressed affect        Assessment & Plan:

## 2013-10-14 NOTE — Assessment & Plan Note (Signed)
With spasticity improved, sees neurology,  to f/u any worsening symptoms or concerns

## 2013-10-14 NOTE — Assessment & Plan Note (Signed)
stable overall by history and exam, recent data reviewed with pt, and pt to continue medical treatment as before,  to f/u any worsening symptoms or concerns Lab Results  Component Value Date   WBC 8.3 04/04/2013   HGB 16.1 04/04/2013   HCT 47.8 04/04/2013   PLT 231.0 04/04/2013   GLUCOSE 102* 04/04/2013   CHOL 119 04/04/2013   TRIG 137.0 04/04/2013   HDL 28.40* 04/04/2013   LDLDIRECT 88.3 10/20/2009   LDLCALC 63 04/04/2013   ALT 20 04/04/2013   AST 23 04/04/2013   NA 139 04/04/2013   K 4.2 04/04/2013   CL 107 04/04/2013   CREATININE 0.8 04/04/2013   BUN 14 04/04/2013   CO2 28 04/04/2013   TSH 1.93 04/04/2013   PSA 0.04* 04/04/2013

## 2013-10-14 NOTE — Patient Instructions (Signed)
Please continue all other medications as before, and refills have been done if requested. Please have the pharmacy call with any other refills you may need. Please continue your efforts at the low cholesterol diet, and weight control. Please keep your appointments with your specialists as you have planned  No lab work needed today  Please return in 6 months, or sooner if needed

## 2013-11-21 DIAGNOSIS — Z8546 Personal history of malignant neoplasm of prostate: Secondary | ICD-10-CM | POA: Diagnosis not present

## 2013-11-26 DIAGNOSIS — N318 Other neuromuscular dysfunction of bladder: Secondary | ICD-10-CM | POA: Diagnosis not present

## 2013-11-26 DIAGNOSIS — Z8546 Personal history of malignant neoplasm of prostate: Secondary | ICD-10-CM | POA: Diagnosis not present

## 2013-11-26 DIAGNOSIS — N319 Neuromuscular dysfunction of bladder, unspecified: Secondary | ICD-10-CM | POA: Diagnosis not present

## 2013-11-26 DIAGNOSIS — N3946 Mixed incontinence: Secondary | ICD-10-CM | POA: Diagnosis not present

## 2013-11-28 DIAGNOSIS — Z85828 Personal history of other malignant neoplasm of skin: Secondary | ICD-10-CM | POA: Diagnosis not present

## 2013-11-28 DIAGNOSIS — L821 Other seborrheic keratosis: Secondary | ICD-10-CM | POA: Diagnosis not present

## 2013-11-28 DIAGNOSIS — L570 Actinic keratosis: Secondary | ICD-10-CM | POA: Diagnosis not present

## 2013-11-28 DIAGNOSIS — L819 Disorder of pigmentation, unspecified: Secondary | ICD-10-CM | POA: Diagnosis not present

## 2014-01-07 ENCOUNTER — Telehealth: Payer: Self-pay | Admitting: *Deleted

## 2014-01-07 NOTE — Telephone Encounter (Signed)
Patient phoned triage line this afternoon at [phone], [phone], and at 1636 requesting for MD to "write a letter" so that he could use it to file with his insurance company and on his taxes for the "several thousand dollar bathroom remodelling" he just finished---secondary to his "handicap"  CB# [phone]

## 2014-01-08 NOTE — Telephone Encounter (Signed)
Patient informed letter requested has been completed by MD and at the front desk for him to pickup at his convenience.

## 2014-01-08 NOTE — Telephone Encounter (Signed)
Letter Done hardcopy to robin  

## 2014-01-13 ENCOUNTER — Other Ambulatory Visit: Payer: Self-pay | Admitting: Internal Medicine

## 2014-02-05 ENCOUNTER — Telehealth: Payer: Self-pay | Admitting: Pulmonary Disease

## 2014-02-05 NOTE — Telephone Encounter (Signed)
Pt states that he received a letter 2 days ago that CPAP was denied-he thinks it from where we did not fill something out right. He states that we should have gotten the same information and need to help fix this. Will forward to [place] to see if she or [place] have received anything on this and how to do the appeal process should be in the letter as well.

## 2014-02-06 NOTE — Telephone Encounter (Signed)
Would route this thru [facility] to see what the issue is.  Glad to help out if I know why .

## 2014-02-09 NOTE — Telephone Encounter (Signed)
Sent staff message to [email or web address]  Joellen Jersey

## 2014-02-09 NOTE — Telephone Encounter (Signed)
Pt being contacted by [facility] to let him know claim is being resummited to cover cpap Joellen Jersey

## 2014-02-15 ENCOUNTER — Other Ambulatory Visit: Payer: Self-pay | Admitting: Neurology

## 2014-02-16 NOTE — Telephone Encounter (Signed)
Dr Krista Blue is out of the office, forwarding request to [facility]

## 2014-02-16 NOTE — Telephone Encounter (Signed)
Rx signed and faxed.

## 2014-03-31 ENCOUNTER — Telehealth: Payer: Self-pay | Admitting: Neurology

## 2014-03-31 NOTE — Telephone Encounter (Signed)
Patient takes Baclofen 10 mg tablet one-half tablet four times a day. He would like the prescription changed to save him money. He is requesting it to be 1-1/2 tablets 4 times daily. Please call to advise.

## 2014-04-01 MED ORDER — BACLOFEN 10 MG PO TABS: ORAL_TABLET | ORAL | Status: DC

## 2014-04-01 NOTE — Telephone Encounter (Signed)
Please call patient, I have written a new prescription of baclofen, 10 mg, one and half tablets 4 times a day, 180 tablets each month, with 12 refills,

## 2014-04-01 NOTE — Telephone Encounter (Signed)
Pt is calling requesting a med change. Please advise

## 2014-04-01 NOTE — Telephone Encounter (Signed)
I called the patient back.  Got no answer.  Left message. 

## 2014-04-15 ENCOUNTER — Ambulatory Visit (INDEPENDENT_AMBULATORY_CARE_PROVIDER_SITE_OTHER): Payer: Medicare Other | Admitting: Internal Medicine

## 2014-04-15 ENCOUNTER — Other Ambulatory Visit (INDEPENDENT_AMBULATORY_CARE_PROVIDER_SITE_OTHER): Payer: Medicare Other

## 2014-04-15 ENCOUNTER — Encounter: Payer: Self-pay | Admitting: Internal Medicine

## 2014-04-15 VITALS — BP 108/64 | HR 75 | Temp 97.8°F | Wt 180.0 lb

## 2014-04-15 DIAGNOSIS — F411 Generalized anxiety disorder: Secondary | ICD-10-CM

## 2014-04-15 DIAGNOSIS — Z23 Encounter for immunization: Secondary | ICD-10-CM

## 2014-04-15 DIAGNOSIS — Z8546 Personal history of malignant neoplasm of prostate: Secondary | ICD-10-CM | POA: Diagnosis not present

## 2014-04-15 DIAGNOSIS — I10 Essential (primary) hypertension: Secondary | ICD-10-CM

## 2014-04-15 DIAGNOSIS — F3289 Other specified depressive episodes: Secondary | ICD-10-CM | POA: Diagnosis not present

## 2014-04-15 DIAGNOSIS — F329 Major depressive disorder, single episode, unspecified: Secondary | ICD-10-CM | POA: Diagnosis not present

## 2014-04-15 LAB — CBC WITH DIFFERENTIAL/PLATELET
BASOS PCT: 0.3 % (ref 0.0–3.0)
Basophils Absolute: 0 10*3/uL (ref 0.0–0.1)
Eosinophils Absolute: 0.3 10*3/uL (ref 0.0–0.7)
Eosinophils Relative: 2.9 % (ref 0.0–5.0)
HEMATOCRIT: 46.6 % (ref 39.0–52.0)
HEMOGLOBIN: 15.5 g/dL (ref 13.0–17.0)
LYMPHS ABS: 1.5 10*3/uL (ref 0.7–4.0)
Lymphocytes Relative: 15.5 % (ref 12.0–46.0)
MCHC: 33.2 g/dL (ref 30.0–36.0)
MCV: 89 fl (ref 78.0–100.0)
MONO ABS: 0.6 10*3/uL (ref 0.1–1.0)
MONOS PCT: 6.5 % (ref 3.0–12.0)
NEUTROS ABS: 7.1 10*3/uL (ref 1.4–7.7)
Neutrophils Relative %: 74.8 % (ref 43.0–77.0)
Platelets: 211 10*3/uL (ref 150.0–400.0)
RBC: 5.24 Mil/uL (ref 4.22–5.81)
RDW: 14.1 % (ref 11.5–15.5)
WBC: 9.4 10*3/uL (ref 4.0–10.5)

## 2014-04-15 LAB — BASIC METABOLIC PANEL
BUN: 15 mg/dL (ref 6–23)
CALCIUM: 8.9 mg/dL (ref 8.4–10.5)
CO2: 27 mEq/L (ref 19–32)
CREATININE: 0.7 mg/dL (ref 0.4–1.5)
Chloride: 106 mEq/L (ref 96–112)
GFR: 116.02 mL/min (ref >60.00)
Glucose, Bld: 89 mg/dL (ref 70–99)
POTASSIUM: 4.4 meq/L (ref 3.5–5.1)
Sodium: 138 mEq/L (ref 135–145)

## 2014-04-15 LAB — HEPATIC FUNCTION PANEL
ALBUMIN: 4 g/dL (ref 3.5–5.2)
ALT: 20 U/L (ref 0–53)
AST: 24 U/L (ref 0–37)
Alkaline Phosphatase: 51 U/L (ref 39–117)
BILIRUBIN TOTAL: 1 mg/dL (ref 0.2–1.2)
Bilirubin, Direct: 0.2 mg/dL (ref 0.0–0.3)
Total Protein: 6.4 g/dL (ref 6.0–8.3)

## 2014-04-15 LAB — LIPID PANEL
CHOL/HDL RATIO: 3
Cholesterol: 117 mg/dL (ref 0–200)
HDL: 34.5 mg/dL — ABNORMAL LOW (ref >39.00)
LDL CALC: 68 mg/dL (ref 0–99)
NonHDL: 82.5
TRIGLYCERIDES: 73 mg/dL (ref 0.0–149.0)
VLDL: 14.6 mg/dL (ref 0.0–40.0)

## 2014-04-15 LAB — TSH: TSH: 1.43 u[IU]/mL (ref 0.35–4.50)

## 2014-04-15 NOTE — Assessment & Plan Note (Signed)
stable overall by history and exam, recent data reviewed with pt, and pt to continue medical treatment as before,  to f/u any worsening symptoms or concerns . BP Readings from Last 3 Encounters:  04/15/14 108/64  10/14/13 130/80  08/05/13 104/70

## 2014-04-15 NOTE — Progress Notes (Signed)
Subjective:    Patient ID: David Kim, male    DOB: [birth year], 68 y.o.   MRN: [id number]  HPI  Here for yearly f/u;  Overall doing ok;  Pt denies CP, worsening SOB, DOE, wheezing, orthopnea, PND, worsening LE edema, palpitations, dizziness or syncope.  Pt denies neurological change such as new headache, facial or extremity weakness.  Pt denies polydipsia, polyuria, or low sugar symptoms. Pt states overall good compliance with treatment and medications, good tolerability, and has been trying to follow lower cholesterol diet.  Pt denies worsening depressive symptoms, suicidal ideation or panic. No fever, night sweats, wt loss, loss of appetite, or other constitutional symptoms.  Pt states fair ability with ADL's, has high fall risk but normally om wjee;cjaor, home safety reviewed and adequate, no other significant changes in hearing or vision.   No current complaints,  Sees urology every 6 mo with psa and ua Denies worsening depressive symptoms, suicidal ideation, or panic.  BP somewhat labile in past, can now stand 40 min per day in the standing machine at home Past Medical History  Diagnosis Date  . Low back pain   . Gout   . IBS (irritable bowel syndrome)   . Peptic ulcer disease   . Allergic rhinitis   . Depression   . Depression   . Anxiety   . Erectile dysfunction   . OSA (obstructive sleep apnea)   . GERD (gastroesophageal reflux disease)   . Prostate cancer   . Hypertension   . Carpal tunnel syndrome     right  . Fatigue   . Paraplegia 04/23/2009    Qualifier: Diagnosis of  By: Jenny Reichmann MD, [facility] Oris   . PROSTATE CANCER, HX OF 05/23/2007    Qualifier: Diagnosis of  By: Jenny Reichmann MD, [place] PARALYSIS 04/23/2009    Qualifier: Diagnosis of  By: Jenny Reichmann MD, [facility] Oris   . Thoracic spinal cord injury 03/12/2012  . ALLERGIC RHINITIS 10/01/2007    Qualifier: Diagnosis of  By: Jenny Reichmann MD, [facility] Oris   . HYPOGONADISM 03/31/2008    Qualifier: Diagnosis of  By: Jenny Reichmann MD, [facility] Oris   . PEPTIC ULCER  DISEASE 10/01/2007    Qualifier: Diagnosis of  By: Jenny Reichmann MD, [facility] Oris    Past Surgical History  Procedure Laterality Date  . Prostatectomy    . Inguinal heniorrhaphy    . Tonsillectomy    . Pilonidal cyst / sinus excision    . Left leg surgery after fibula      reports that he has never smoked. He has never used smokeless tobacco. He reports that he does not drink alcohol or use illicit drugs. family history includes Dementia in his mother; Diabetes in his father; High blood pressure in his mother. Allergies  Allergen Reactions  . Amoxicillin   . Endal Hd    Current Outpatient Prescriptions on File Prior to Visit  Medication Sig Dispense Refill  . aspirin (ECOTRIN LOW STRENGTH) 81 MG EC tablet Take 81 mg by mouth daily.        . baclofen (LIORESAL) 10 MG tablet One and 1/2 tabs four times a day  180 each  12  . clonazePAM (KLONOPIN) 2 MG tablet take 1 tablet by mouth at bedtime if needed for anxiety AND TAKE 1/2 TABLET BY MOUTH IN MORNING  45 tablet  5  . losartan (COZAAR) 50 MG tablet take 1 tablet by mouth once daily  90 tablet  3  . oxybutynin (  DITROPAN) 5 MG tablet Take 5 mg by mouth 4 (four) times daily.       Marland Kitchen senna (SENOKOT) 8.6 MG tablet Take 2 tablets by mouth at bedtime as needed.       Marland Kitchen tiZANidine (ZANAFLEX) 4 MG tablet Take 2 tablets (8 mg total) by mouth every 6 (six) hours as needed.  270 tablet  12   No current facility-administered medications on file prior to visit.    Review of Systems Constitutional: Negative for increased diaphoresis, other activity, appetite or other siginficant weight change  HENT: Negative for worsening hearing loss, ear pain, facial swelling, mouth sores and neck stiffness.   Eyes: Negative for other worsening pain, redness or visual disturbance.  Respiratory: Negative for shortness of breath and wheezing.   Cardiovascular: Negative for chest pain and palpitations.  Gastrointestinal: Negative for diarrhea, blood in stool, abdominal  distention or other pain Genitourinary: Negative for hematuria, flank pain or change in urine volume.  Musculoskeletal: Negative for myalgias or other joint complaints.  Skin: Negative for color change and wound.  Neurological: Negative for syncope and numbness. other than noted Hematological: Negative for adenopathy. or other swelling Psychiatric/Behavioral: Negative for hallucinations, self-injury, decreased concentration or other worsening agitation.      Objective:   Physical Exam BP 108/64  Pulse 75  Temp(Src) 97.8 F (36.6 C) (Oral)  Wt 180 lb (81.647 kg)  SpO2 94% VS noted, in wheelchair Constitutional: Pt is oriented to person, place, and time/overwt Appears well-developed and well-nourished.  Head: Normocephalic and atraumatic.  Right Ear: External ear normal.  Left Ear: External ear normal.  Nose: Nose normal.  Mouth/Throat: Oropharynx is clear and moist.  Eyes: Conjunctivae and EOM are normal. Pupils are equal, round, and reactive to light.  Neck: Normal range of motion. Neck supple. No JVD present. No tracheal deviation present.  Cardiovascular: Normal rate, regular rhythm, normal heart sounds and intact distal pulses.   Pulmonary/Chest: Effort normal and breath sounds without rales or wheezing  Abdominal: Soft. Bowel sounds are normal. NT. No HSM  Musculoskeletal: Normal range of motion. Exhibits trace ankle edema bilat.  Lymphadenopathy:  Has no cervical adenopathy.  Neurological: Pt is alert and oriented to person, place, and time. Pt has normal reflexes. No cranial nerve deficit. Motor grossly intact Skin: Skin is warm and dry. No rash noted.  Psychiatric:  Has somewhat dysphoric mood and affect., mild nervous, Behavior is normal.     Assessment & Plan:

## 2014-04-15 NOTE — Progress Notes (Signed)
Pre visit review using our clinic review tool, if applicable. No additional management support is needed unless otherwise documented below in the visit note. 

## 2014-04-15 NOTE — Addendum Note (Signed)
Addended by: Sharon Seller B on: 04/15/2014 03:07 PM   Modules accepted: Orders

## 2014-04-15 NOTE — Assessment & Plan Note (Signed)
stable overall by history and exam, and pt to continue medical treatment as before,  to f/u any worsening symptoms or concerns 

## 2014-04-15 NOTE — Patient Instructions (Addendum)
You had the new Prevnar pneumonia shot today  Please continue all other medications as before, and refills have been done if requested.  Please have the pharmacy call with any other refills you may need.  Please continue your efforts at being more active, low cholesterol diet, and weight control.  You are otherwise up to date with prevention measures today.  Please keep your appointments with your specialists as you may have planned - urology  Please go to the LAB in the Basement (turn left off the elevator) for the tests to be done today  You will be contacted by phone if any changes need to be made immediately.  Otherwise, you will receive a letter about your results with an explanation, but please check with MyChart first.  Please remember to sign up for MyChart if you have not done so, as this will be important to you in the future with finding out test results, communicating by private email, and scheduling acute appointments online when needed.  Please return in 6 months, or sooner if needed

## 2014-04-15 NOTE — Assessment & Plan Note (Signed)
To f/u with urology as planned with psa

## 2014-04-15 NOTE — Assessment & Plan Note (Signed)
stable overall by history and exam, and pt to continue medical treatment as before - clonazepam prn,  to f/u any worsening symptoms or concerns

## 2014-04-16 ENCOUNTER — Encounter: Payer: Self-pay | Admitting: Internal Medicine

## 2014-05-01 ENCOUNTER — Encounter: Payer: Medicare Other | Attending: Physical Medicine & Rehabilitation | Admitting: Physical Medicine & Rehabilitation

## 2014-05-01 ENCOUNTER — Encounter: Payer: Self-pay | Admitting: Physical Medicine & Rehabilitation

## 2014-05-01 VITALS — BP 145/82 | HR 83 | Resp 14

## 2014-05-01 DIAGNOSIS — Y33XXXS Other specified events, undetermined intent, sequela: Secondary | ICD-10-CM | POA: Insufficient documentation

## 2014-05-01 DIAGNOSIS — J069 Acute upper respiratory infection, unspecified: Secondary | ICD-10-CM

## 2014-05-01 DIAGNOSIS — IMO0002 Reserved for concepts with insufficient information to code with codable children: Secondary | ICD-10-CM | POA: Diagnosis not present

## 2014-05-01 DIAGNOSIS — I10 Essential (primary) hypertension: Secondary | ICD-10-CM | POA: Insufficient documentation

## 2014-05-01 DIAGNOSIS — C720 Malignant neoplasm of spinal cord: Secondary | ICD-10-CM | POA: Insufficient documentation

## 2014-05-01 DIAGNOSIS — R269 Unspecified abnormalities of gait and mobility: Secondary | ICD-10-CM

## 2014-05-01 DIAGNOSIS — G4733 Obstructive sleep apnea (adult) (pediatric): Secondary | ICD-10-CM | POA: Diagnosis not present

## 2014-05-01 DIAGNOSIS — G8220 Paraplegia, unspecified: Secondary | ICD-10-CM | POA: Insufficient documentation

## 2014-05-01 DIAGNOSIS — K219 Gastro-esophageal reflux disease without esophagitis: Secondary | ICD-10-CM | POA: Insufficient documentation

## 2014-05-01 DIAGNOSIS — Z8546 Personal history of malignant neoplasm of prostate: Secondary | ICD-10-CM | POA: Insufficient documentation

## 2014-05-01 DIAGNOSIS — S24109S Unspecified injury at unspecified level of thoracic spinal cord, sequela: Secondary | ICD-10-CM

## 2014-05-01 MED ORDER — CLONAZEPAM 2 MG PO TABS: 1.0000 mg | ORAL_TABLET | Freq: Every day | ORAL | Status: DC

## 2014-05-01 NOTE — Progress Notes (Signed)
Subjective:    Patient ID: David Kim, male    DOB: Mar 26, 1946, 68 y.o.   MRN: [id number]  HPI  David Kim is back regarding his T10 SCI and associated paraplegia. His spasticity has been fairly well controlled. He stopped taking the daytime klonopin since I last saw him but is still taking the HS dose.  He has had some improvement in his urinary function and has had some sense of when he needs to empty.   He and his wife note at least 2 spells over the last 6-9 months where he has gone "blank" and went into a "staring spell" unable to speak. The episode resolved after 10-15 minutes without any ongoing symptoms.    Pain Inventory Average Pain 0 Pain Right Now 0 My pain is no pain  In the last 24 hours, has pain interfered with the following? General activity 0 Relation with others 0 Enjoyment of life 0 What TIME of day is your pain at its worst? na Sleep (in general) Fair  Pain is worse with: na Pain improves with: na Relief from Meds: no pain meds  Mobility ability to climb steps?  no do you drive?  no use a wheelchair transfers alone  Function disabled: date disabled 02/12/2009 retired I need assistance with the following:  bathing, toileting, meal prep, household duties and shopping  Neuro/Psych bladder control problems weakness numbness trouble walking spasms  Prior Studies Any changes since last visit?  no  Physicians involved in your care Any changes since last visit?  no   Family History  Problem Relation Age of Onset  . Diabetes Father   . High blood pressure Mother   . Dementia Mother    History   Social History  . Marital Status: Married    Spouse Name: David Kim    Number of Children: 0  . Years of Education: 12   Occupational History  . retired   .     Social History Main Topics  . Smoking status: Never Smoker   . Smokeless tobacco: Never Used  . Alcohol Use: No  . Drug Use: No  . Sexual Activity: None   Other Topics Concern    . None   Social History Narrative   Patient lives at home with his wife David Kim). Patient is disabled. Patient has 12 th grade education.   Right handed.   Caffeine- None   Past Surgical History  Procedure Laterality Date  . Prostatectomy    . Inguinal heniorrhaphy    . Tonsillectomy    . Pilonidal cyst / sinus excision    . Left leg surgery after fibula     Past Medical History  Diagnosis Date  . Low back pain   . Gout   . IBS (irritable bowel syndrome)   . Peptic ulcer disease   . Allergic rhinitis   . Depression   . Depression   . Anxiety   . Erectile dysfunction   . OSA (obstructive sleep apnea)   . GERD (gastroesophageal reflux disease)   . Prostate cancer   . Hypertension   . Carpal tunnel syndrome     right  . Fatigue   . Paraplegia 04/23/2009    Qualifier: Diagnosis of  By: Jenny Reichmann MD, [facility] Oris   . PROSTATE CANCER, HX OF 05/23/2007    Qualifier: Diagnosis of  By: Jenny Reichmann MD, [place] PARALYSIS 04/23/2009    Qualifier: Diagnosis of  By: Jenny Reichmann MD, [facility] Oris   .  Thoracic spinal cord injury 03/12/2012  . ALLERGIC RHINITIS 10/01/2007    Qualifier: Diagnosis of  By: Jenny Reichmann MD, [facility] Oris   . HYPOGONADISM 03/31/2008    Qualifier: Diagnosis of  By: Jenny Reichmann MD, [facility] Oris   . PEPTIC ULCER DISEASE 10/01/2007    Qualifier: Diagnosis of  By: Jenny Reichmann MD, [facility] Oris    BP 145/82  Pulse 83  Resp 14  SpO2 95%  Opioid Risk Score:   Fall Risk Score: High Fall Risk (>13 points) (pt educated on fall risk, brochure given to pt)   Review of Systems  Respiratory: Positive for apnea.   Cardiovascular: Positive for leg swelling.  Gastrointestinal: Positive for constipation.  Genitourinary: Positive for decreased urine volume.       Bladder control problems  Musculoskeletal: Positive for gait problem.  Neurological: Positive for weakness and numbness.       Spasms  All other systems reviewed and are negative.      Objective:   Physical Exam Constitutional: He appears well-developed  and well-nourished.  HENT:  Head: Normocephalic and atraumatic.  Eyes: Conjunctivae and EOM are normal. Pupils are equal, round, and reactive to light.  Neck: Normal range of motion. Neck supple.  Cardiovascular: Normal rate and regular rhythm.  Pulmonary/Chest: Effort normal and breath sounds normal.  Neurological:  No volitional movement in the legs. He uses his hips and trunk to help move his legs. He has no sensation below the level of injury. He has 0   resting tone in the legs. DTR's are hyperactive. He has sustained clonus in both ankles. UE strength is normal.   Sitting posture is good.  UE strength is 5/5.  Skin:  large area of scar over the medial thigh which has nicely healed.  Psychiatric: He has a normal mood and affect. His behavior is normal. Judgment and thought content normal.    Assessment & Plan:   ASSESSMENT:  1. 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  2. Hypertension.  3. Left thigh wound which appears healed and is scarred over.      PLAN:  1. The patient is doing very well with his spasticity management. We  will continue with his baclofen, tizanidine. We will try to see if he can come off the klonopin. Will decrease to 1mg  qhs for one month then stop.  2. Reviewed these "blank" spells also. Could these be seizures or TIA's. My recommendation was to visit the ED if he experiences another. He should also review with his neurologist.  Might benefit from an [place] 3. Continue with standing frame and home exercises. I did discuss being careful not to over use is upper extremities and his shoulders in particular.   4. We will continue with yearly follow up.  5. 15 minutes of face to face patient care time were spent during this visit. All questions were encouraged and answered.

## 2014-05-01 NOTE — Patient Instructions (Signed)
DECREASE YOU KLONOPIN TO 1/2 TABLET AT BEDTIME FOR ONE MONTH. IF YOU ARE NOT EXPERIENCING ANY INCREASE IN YOUR SPASMS, THEN STOP THE MEDICATION AT THAT TIME.    IF YOU HAVE ANOTHER "BLANK" SPELL, PLEASE HEAD TO THE EMERGENCY ROOM ASAP.

## 2014-05-05 ENCOUNTER — Ambulatory Visit: Payer: Medicare Other | Admitting: Physical Medicine & Rehabilitation

## 2014-05-20 DIAGNOSIS — Z8546 Personal history of malignant neoplasm of prostate: Secondary | ICD-10-CM | POA: Diagnosis not present

## 2014-05-27 DIAGNOSIS — Z8546 Personal history of malignant neoplasm of prostate: Secondary | ICD-10-CM | POA: Diagnosis not present

## 2014-07-06 DIAGNOSIS — G8220 Paraplegia, unspecified: Secondary | ICD-10-CM | POA: Diagnosis not present

## 2014-07-06 DIAGNOSIS — G839 Paralytic syndrome, unspecified: Secondary | ICD-10-CM | POA: Diagnosis not present

## 2014-07-23 ENCOUNTER — Encounter: Payer: Self-pay | Admitting: Internal Medicine

## 2014-07-23 ENCOUNTER — Ambulatory Visit (INDEPENDENT_AMBULATORY_CARE_PROVIDER_SITE_OTHER): Payer: Medicare Other | Admitting: Internal Medicine

## 2014-07-23 VITALS — BP 122/82 | HR 69 | Temp 98.1°F | Wt 180.0 lb

## 2014-07-23 DIAGNOSIS — G8220 Paraplegia, unspecified: Secondary | ICD-10-CM | POA: Diagnosis not present

## 2014-07-23 DIAGNOSIS — I10 Essential (primary) hypertension: Secondary | ICD-10-CM

## 2014-07-23 DIAGNOSIS — Z23 Encounter for immunization: Secondary | ICD-10-CM

## 2014-07-23 NOTE — Patient Instructions (Signed)
Please continue all other medications as before, and refills have been done if requested.  Please have the pharmacy call with any other refills you may need.  Please continue your efforts at being more active, low cholesterol diet, and weight control.  You are otherwise up to date with prevention measures today.  Please keep your appointments with your specialists as you may have planned   

## 2014-07-23 NOTE — Progress Notes (Signed)
Pre visit review using our clinic review tool, if applicable. No additional management support is needed unless otherwise documented below in the visit note. 

## 2014-07-23 NOTE — Progress Notes (Signed)
Subjective:    Patient ID: David Kim, male    DOB: 02-05-1946, 68 y.o.   MRN: [id number]  HPI  Pt here for exam for manual ultra-lightwt wheelchair  Reviewed and concur with PT eval.  Although has some upper extremity strength, he does need the ultralightwt manual wheelchair (vs the standar) in order for him in particular to manually proprel room to room in the home, as well as access ramps in the community.  Is also needed in order to breakdown and store in his personal vehicle in order to leave the home for doctor's appts.    Pt denies chest pain, increased sob or doe, wheezing, orthopnea, PND, increased LE swelling, palpitations, dizziness or syncope.BP has been somewhat labile. Past Medical History  Diagnosis Date  . Low back pain   . Gout   . IBS (irritable bowel syndrome)   . Peptic ulcer disease   . Allergic rhinitis   . Depression   . Depression   . Anxiety   . Erectile dysfunction   . OSA (obstructive sleep apnea)   . GERD (gastroesophageal reflux disease)   . Prostate cancer   . Hypertension   . Carpal tunnel syndrome     right  . Fatigue   . Paraplegia 04/23/2009    Qualifier: Diagnosis of  By: Jenny Reichmann MD, [facility] Oris   . PROSTATE CANCER, HX OF 05/23/2007    Qualifier: Diagnosis of  By: Jenny Reichmann MD, [place] PARALYSIS 04/23/2009    Qualifier: Diagnosis of  By: Jenny Reichmann MD, [facility] Oris   . Thoracic spinal cord injury 03/12/2012  . ALLERGIC RHINITIS 10/01/2007    Qualifier: Diagnosis of  By: Jenny Reichmann MD, [facility] Oris   . HYPOGONADISM 03/31/2008    Qualifier: Diagnosis of  By: Jenny Reichmann MD, [facility] Oris   . PEPTIC ULCER DISEASE 10/01/2007    Qualifier: Diagnosis of  By: Jenny Reichmann MD, [facility] Oris    Past Surgical History  Procedure Laterality Date  . Prostatectomy    . Inguinal heniorrhaphy    . Tonsillectomy    . Pilonidal cyst / sinus excision    . Left leg surgery after fibula      reports that he has never smoked. He has never used smokeless tobacco. He reports that he does not drink alcohol  or use illicit drugs. family history includes Dementia in his mother; Diabetes in his father; High blood pressure in his mother. Allergies  Allergen Reactions  . Amoxicillin   . Endal Hd    Current Outpatient Prescriptions on File Prior to Visit  Medication Sig Dispense Refill  . aspirin (ECOTRIN LOW STRENGTH) 81 MG EC tablet Take 81 mg by mouth daily.        . baclofen (LIORESAL) 10 MG tablet One and 1/2 tabs four times a day  180 each  12  . clonazePAM (KLONOPIN) 2 MG tablet Take 0.5-1 tablets (1-2 mg total) by mouth at bedtime. FOR SPASTICITY  30 tablet  1  . losartan (COZAAR) 50 MG tablet take 1 tablet by mouth once daily  90 tablet  3  . oxybutynin (DITROPAN) 5 MG tablet Take 5 mg by mouth 4 (four) times daily.       Marland Kitchen tiZANidine (ZANAFLEX) 4 MG tablet Take 2 tablets (8 mg total) by mouth every 6 (six) hours as needed.  270 tablet  12   No current facility-administered medications on file prior to visit.    Review of Systems  Constitutional: Negative  for unusual diaphoresis or other sweats  HENT: Negative for ringing in ear Eyes: Negative for double vision or worsening visual disturbance.  Respiratory: Negative for choking and stridor.   Gastrointestinal: Negative for vomiting or other signifcant bowel change Genitourinary: Negative for hematuria or decreased urine volume.  Musculoskeletal: Negative for other MSK pain or swelling Skin: Negative for color change and worsening wound.  Neurological: Negative for tremors and numbness other than noted  Psychiatric/Behavioral: Negative for decreased concentration or agitation other than above       Objective:   Physical Exam BP 122/82  Pulse 69  Temp(Src) 98.1 F (36.7 C) (Oral)  Wt 180 lb (81.647 kg)  SpO2 94% VS noted,  Constitutional: Pt appears well-developed, well-nourished.  HENT: Head: NCAT.  Right Ear: External ear normal.  Left Ear: External ear normal.  Eyes: . Pupils are equal, round, and reactive to light.  Conjunctivae and EOM are normal Neck: Normal range of motion. Neck supple.  Cardiovascular: Normal rate and regular rhythm.   Pulmonary/Chest: Effort normal and breath sounds normal.  Abd:  Soft, NT, ND, + BS Neurological: Pt is alert. Not confused , motor c/w prior paraplegia Skin: Skin is warm. No rash Psychiatric: Pt behavior is normal. No agitation.     Assessment & Plan:

## 2014-07-26 NOTE — Assessment & Plan Note (Signed)
Ok for replacement ultralight manual wheelchair, forms to be signed

## 2014-07-26 NOTE — Assessment & Plan Note (Signed)
Somewhat labile, overall < 140/90, cont same tx,  to f/u any worsening symptoms or concerns

## 2014-07-30 ENCOUNTER — Telehealth: Payer: Self-pay

## 2014-07-30 NOTE — Telephone Encounter (Signed)
[facility] did received office notes from 07/23/14 appt.  They now need for PCP to close note, sign and date, then print and copy office note.   Please fax to [phone]  ATTN:  [facility].

## 2014-07-30 NOTE — Telephone Encounter (Signed)
This has been done.

## 2014-08-05 ENCOUNTER — Ambulatory Visit (INDEPENDENT_AMBULATORY_CARE_PROVIDER_SITE_OTHER): Payer: Medicare Other | Admitting: Pulmonary Disease

## 2014-08-05 ENCOUNTER — Encounter: Payer: Self-pay | Admitting: Pulmonary Disease

## 2014-08-05 VITALS — BP 122/64 | HR 77 | Temp 97.0°F | Ht 68.0 in

## 2014-08-05 DIAGNOSIS — G4733 Obstructive sleep apnea (adult) (pediatric): Secondary | ICD-10-CM

## 2014-08-05 NOTE — Patient Instructions (Signed)
Continue on CPAP, and keep up with mask changes and supplies. Work on weight loss as able Followup with me again in one year.

## 2014-08-05 NOTE — Progress Notes (Signed)
   Subjective:    Patient ID: David Kim, male    DOB: April 10, 1946, 68 y.o.   MRN: [id number]  HPI The patient comes in today for followup of his obstructive sleep apnea. He has gotten a new machine from the last visit, and is doing very well with his device. He is wearing CPAP compliantly by his download, and has excellent control of his AHI and no significant mask leaks. He feels that he is sleeping well with the device, although I have encouraged him to try to get a little more sleep.   Review of Systems  Constitutional: Negative for fever and unexpected weight change.  HENT: Negative for congestion, dental problem, ear pain, nosebleeds, postnasal drip, rhinorrhea, sinus pressure, sneezing, sore throat and trouble swallowing.   Eyes: Negative for redness and itching.  Respiratory: Negative for cough, chest tightness, shortness of breath and wheezing.   Cardiovascular: Negative for palpitations and leg swelling.  Gastrointestinal: Negative for nausea and vomiting.  Genitourinary: Negative for dysuria.  Musculoskeletal: Negative for joint swelling.  Skin: Negative for rash.  Neurological: Negative for headaches.  Hematological: Does not bruise/bleed easily.  Psychiatric/Behavioral: Negative for dysphoric mood. The patient is not nervous/anxious.        Objective:   Physical Exam Overweight male in no acute distress Nose without purulence or discharge noted No skin breakdown or pressure necrosis from the CPAP mask Neck without lymphadenopathy or thyromegaly Lower extremities with mild edema, no cyanosis Alert and oriented, moves all 4 extremities.       Assessment & Plan:

## 2014-08-05 NOTE — Assessment & Plan Note (Signed)
The patient is doing well with CPAP at his current setting, and I have encouraged him to keep up with mask changes and supplies. I've also encouraged him to work on weight loss is much as he can.

## 2014-08-13 ENCOUNTER — Ambulatory Visit (INDEPENDENT_AMBULATORY_CARE_PROVIDER_SITE_OTHER): Payer: Medicare Other | Admitting: Neurology

## 2014-08-13 ENCOUNTER — Encounter: Payer: Self-pay | Admitting: Neurology

## 2014-08-13 VITALS — BP 119/78 | HR 75

## 2014-08-13 DIAGNOSIS — G8220 Paraplegia, unspecified: Secondary | ICD-10-CM | POA: Diagnosis not present

## 2014-08-13 DIAGNOSIS — R269 Unspecified abnormalities of gait and mobility: Secondary | ICD-10-CM | POA: Diagnosis not present

## 2014-08-13 DIAGNOSIS — H5319 Other subjective visual disturbances: Secondary | ICD-10-CM | POA: Insufficient documentation

## 2014-08-13 MED ORDER — TIZANIDINE HCL 2 MG PO TABS: 4.0000 mg | ORAL_TABLET | Freq: Three times a day (TID) | ORAL | Status: DC | PRN

## 2014-08-13 MED ORDER — CLONAZEPAM 2 MG PO TABS: 2.0000 mg | ORAL_TABLET | Freq: Every day | ORAL | Status: DC

## 2014-08-13 MED ORDER — BACLOFEN 20 MG PO TABS: 20.0000 mg | ORAL_TABLET | Freq: Four times a day (QID) | ORAL | Status: DC

## 2014-08-13 NOTE — Progress Notes (Signed)
History of Present Illness:  David Kim came in to refill his medications, he is accompanied by his wife, sitting wheelchair  David Kim developed weakness in his legs in 2009. He slipped on ice in January 2009 and fractures ankles requiring pinning procedures. Following that time he developed progressive leg weakness. 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 It became progressively less safe for him to do that. He began to experience difficulty getting out of a car.   He had MRI imaging studies of his head, cervical, and lumbar spine.  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 Following the procedure, the patient became paraplegic, a hematoma was found in the operative site when he was reopened. He did not regain his ability to move his legs which he had been able to do preoperatively.  He is a sensory level below the umbilicus. He has neurogenic bowel and bladder and wears diapers all the time. Occasional UTI. He was evaluated by Dr. Gaynell Face, deemed not a baclofen pump candidate due to nonambulatory, no significant pain.  The patient has spasticity in his lower extremities and his torso, but he is not experiencing pain when he has spasms. The spasticity does not awaken him at night time. His tone has not increased to the point where it is difficult to dress him, perform hygiene, or to place him in his wheelchair.  He self transfer himself in and out of wheelchair.  He is on both baclofen 20+10mg + 5mg  qid and tizanidine 4mg  2 tabs q6hours, clonazepam 2mg  1/2 qam, one qhs. He complains of dry mouth, also taking oxybutynin, but no excessive drowsiness, wife has been stretching his legs on daily basis.   He has no significant bilateral lower extremity movement, sensory level from umbilical down no significant pain.  UPDATE Oct 8th 2015: He has lower extremity spasm, which is overall  manageable recurrent baclofen 20 mg, 1 and half tablets 4 times a day, tizanidine as needed, clonazepam 2 mg every night   Today he also reported episode of seeing spider webs in his visual field, with associated blurry vision, mild confusion, lasting 10-15 minutes, there was no associated headaches, which raised the possibility of possible partial seizure, vs. visual aura without migraine,  He denies a previous history of migraine, this episode happened 3-4 times each year. He denied previous history of seizure  Review of Systems  Out of a complete 14 system review, the patient complains of only the following symptoms, and all other reviewed systems are negative.  Swelling in legs,  ringing ears, feeling hot, feeling cold, confusion, numbness, slurred speech, dizziness, insomnia, not enough sleep.  Social History  He is retired from the [employer] center. He lives at home with his wife. He denies any history of tobacco, alcohol, or illicit drug use. Inhaled Tobacco Use: never smoker  Family History Father with heart disease and diabetes. Mother died at the age of 61 on 11/07/2009 from a stroke  Past Medical History  Spinal cord tumor with resultant paraplegia as above. History of prostate cancer. Hypertension, on medications.  Surgical History  Thoracic spine surgery for spinal cord tumor removal in April 2010 as above. Also prostate surgery,  left ankle surgery.   Physical Exam  General: alert, well developed, well nourished, in no acute distress Neck: supple no carotid bruits Respiratory: clear to auscultation bilaterally Cardiovascular: regular rate rhythm Skin: no rashes  Neurologic Exam  Mental Status: mild obese, in wheel chair Cranial Nerves: CN II-XII pupils were equal round reactive to light.    Extraocular movements were full.  Visual fields were full on confrontational test.  Facial sensation and strength were normal.  Hearing was intact to finger rubbing  bilaterally.  Uvula tongue were midline.  Head turning and shoulder shrugging were normal and symmetric.  Tongue protrusion into the cheeks strength were normal.  Motor: very strong upper extremity muscles, normal tone, mild to moderate spasticity of lower extremity, minimal hip flexion movement, mild bilateral knee contraction to 175 degree, ankle plantar flexion. Sensory: sensory level to T10 bilaterally. Coordination: good finger-to-nose, rapid repetitive alternating movements and finger apposition   Gait and Station: can not bear weight Reflexes: Deep tendon reflexes: Biceps: 2/2, Brachioradialis: 2/2, Triceps: 2/2, Pateller: 2+/2+, sustained bilateral ankle clonus   Assessment and Plan:    s/p T10 ependymoma, now with spastic paraplegia, wheelchair bound  He is not ambulatory, no significant pain, spasticity does not pose signficant limitation on his care, will call in RX for baclofen, 20 qid, tizanidine 4mg  2 tabs qid prn, clonazepam 2mg   one qhs.   Today he also complains about episode of recurrent spider webs in his visual field, with associated mild confusion, no headaches, lasting 10-15 minutes, 3-4 episode each year, differentiation diagnosis including partial seizure, vs visual aura without migraine headaches  Return to clinic in one year.

## 2014-08-26 ENCOUNTER — Other Ambulatory Visit: Payer: Federal, State, Local not specified - PPO

## 2014-08-26 DIAGNOSIS — D2262 Melanocytic nevi of left upper limb, including shoulder: Secondary | ICD-10-CM | POA: Diagnosis not present

## 2014-08-26 DIAGNOSIS — Z85828 Personal history of other malignant neoplasm of skin: Secondary | ICD-10-CM | POA: Diagnosis not present

## 2014-08-26 DIAGNOSIS — L218 Other seborrheic dermatitis: Secondary | ICD-10-CM | POA: Diagnosis not present

## 2014-08-26 DIAGNOSIS — L821 Other seborrheic keratosis: Secondary | ICD-10-CM | POA: Diagnosis not present

## 2014-08-31 ENCOUNTER — Ambulatory Visit
Admission: RE | Admit: 2014-08-31 | Discharge: 2014-08-31 | Disposition: A | Discharge status: Home / Self Care | Payer: Medicare Other | Source: Ambulatory Visit | Attending: Neurology | Admitting: Neurology

## 2014-08-31 DIAGNOSIS — H5319 Other subjective visual disturbances: Secondary | ICD-10-CM

## 2014-08-31 DIAGNOSIS — R269 Unspecified abnormalities of gait and mobility: Secondary | ICD-10-CM

## 2014-08-31 DIAGNOSIS — G8220 Paraplegia, unspecified: Secondary | ICD-10-CM

## 2014-09-01 ENCOUNTER — Telehealth: Payer: Self-pay | Admitting: Neurology

## 2014-09-01 ENCOUNTER — Ambulatory Visit (INDEPENDENT_AMBULATORY_CARE_PROVIDER_SITE_OTHER): Payer: Medicare Other

## 2014-09-01 DIAGNOSIS — R269 Unspecified abnormalities of gait and mobility: Secondary | ICD-10-CM

## 2014-09-01 DIAGNOSIS — G8220 Paraplegia, unspecified: Secondary | ICD-10-CM

## 2014-09-01 DIAGNOSIS — H5319 Other subjective visual disturbances: Secondary | ICD-10-CM

## 2014-09-01 NOTE — Telephone Encounter (Signed)
I have called him, MRI brain showed mild atrophy, mild progression.

## 2014-09-11 ENCOUNTER — Other Ambulatory Visit: Payer: Self-pay | Admitting: Neurology

## 2014-09-18 ENCOUNTER — Other Ambulatory Visit: Payer: Self-pay | Admitting: Neurology

## 2014-09-18 ENCOUNTER — Telehealth: Payer: Self-pay | Admitting: Neurology

## 2014-09-18 NOTE — Telephone Encounter (Signed)
A one year Rx was sent on 10/08.  I called the pharmacy.  Spoke with Pharmacist who said the patient already picked up his Rx on 11/06.  I called the patient back, got no answer.  Left message.

## 2014-09-18 NOTE — Telephone Encounter (Signed)
Last OV note says tizanidine 4mg  2 tabs qid prn

## 2014-09-18 NOTE — Procedures (Signed)
   HISTORY: 68 year old male, presenting with recurrent episode of seeing spiderweb in his visual field, sometimes followed by headaches,  TECHNIQUE:  16 channel EEG was performed based on standard 10-16 international system. One channel was dedicated to EKG, which has demonstrates normal sinus rhythm of 66 beats per minutes.  Upon awakening, the posterior background activity was dysarrhythmic, in alpha range,  reactive to eye opening and closure.  There was occasionally T5 sharp transient.  Photic stimulation was performed, which induced a symmetric photic driving.  Hyperventilation was performed, there was no abnormality elicit, but increased appearance of T5 sharp transient  No sleep was achieved.  CONCLUSION: This is a  normal awake EEG.  There is occasionally T5 sharp transient, indicating focal irritability, but no evidence of epileptiform discharge

## 2014-09-18 NOTE — Telephone Encounter (Signed)
Patient calling to request refill of Tizanidine, states that his pharmacy hasn't heard back, please call and advise.

## 2014-09-22 NOTE — Progress Notes (Signed)
Quick Note:  Called and spoke to patient told him normal EEG. Patient understood. ______

## 2014-10-21 ENCOUNTER — Encounter: Payer: Self-pay | Admitting: Internal Medicine

## 2014-10-21 ENCOUNTER — Ambulatory Visit (INDEPENDENT_AMBULATORY_CARE_PROVIDER_SITE_OTHER): Payer: Medicare Other | Admitting: Internal Medicine

## 2014-10-21 VITALS — BP 138/80 | HR 75 | Temp 98.2°F | Wt 180.0 lb

## 2014-10-21 DIAGNOSIS — Z23 Encounter for immunization: Secondary | ICD-10-CM

## 2014-10-21 DIAGNOSIS — I10 Essential (primary) hypertension: Secondary | ICD-10-CM | POA: Diagnosis not present

## 2014-10-21 DIAGNOSIS — F329 Major depressive disorder, single episode, unspecified: Secondary | ICD-10-CM | POA: Diagnosis not present

## 2014-10-21 DIAGNOSIS — F411 Generalized anxiety disorder: Secondary | ICD-10-CM

## 2014-10-21 DIAGNOSIS — F32A Depression, unspecified: Secondary | ICD-10-CM

## 2014-10-21 NOTE — Progress Notes (Signed)
Subjective:    Patient ID: David Kim, male    DOB: Aug 15, 1946, 68 y.o.   MRN: [id number]  HPI  Here to f/u; overall doing ok,  Pt denies chest pain, increased sob or doe, wheezing, orthopnea, PND, increased LE swelling, palpitations, dizziness or syncope.  Pt denies polydipsia, polyuria, or low sugar symptoms such as weakness or confusion improved with po intake.  Pt denies new neurological symptoms such as new headache, or facial or extremity weakness or numbness.   Pt states overall good compliance with meds, has been trying to follow lower cholesterol, diabetic diet, with wt overall stable,  but little exercise however. Denies worsening depressive symptoms, suicidal ideation, or panic; though has ongoing irritability., trying to remain as active as possible.  Has bought a standing machine with his own funds, and scooter x 2. Past Medical History  Diagnosis Date  . Low back pain   . Gout   . IBS (irritable bowel syndrome)   . Peptic ulcer disease   . Allergic rhinitis   . Depression   . Depression   . Anxiety   . Erectile dysfunction   . OSA (obstructive sleep apnea)   . GERD (gastroesophageal reflux disease)   . Prostate cancer   . Hypertension   . Carpal tunnel syndrome     right  . Fatigue   . Paraplegia 04/23/2009    Qualifier: Diagnosis of  By: Jenny Reichmann MD, Hunt Oris   . PROSTATE CANCER, HX OF 05/23/2007    Qualifier: Diagnosis of  By: Jenny Reichmann MD, [place] PARALYSIS 04/23/2009    Qualifier: Diagnosis of  By: Jenny Reichmann MD, Hunt Oris   . Thoracic spinal cord injury 03/12/2012  . ALLERGIC RHINITIS 10/01/2007    Qualifier: Diagnosis of  By: Jenny Reichmann MD, Hunt Oris   . HYPOGONADISM 03/31/2008    Qualifier: Diagnosis of  By: Jenny Reichmann MD, Hunt Oris   . PEPTIC ULCER DISEASE 10/01/2007    Qualifier: Diagnosis of  By: Jenny Reichmann MD, Hunt Oris    Past Surgical History  Procedure Laterality Date  . Prostatectomy    . Inguinal heniorrhaphy    . Tonsillectomy    . Pilonidal cyst / sinus excision    . Left  leg surgery after fibula      reports that he has never smoked. He has never used smokeless tobacco. He reports that he does not drink alcohol or use illicit drugs. family history includes Dementia in his mother; Diabetes in his father; High blood pressure in his mother. Allergies  Allergen Reactions  . Amoxicillin   . Endal Hd    Current Outpatient Prescriptions on File Prior to Visit  Medication Sig Dispense Refill  . aspirin (ECOTRIN LOW STRENGTH) 81 MG EC tablet Take 81 mg by mouth daily.      . baclofen (LIORESAL) 20 MG tablet Take 1 tablet (20 mg total) by mouth 4 (four) times daily. One and 1/2 tabs four times a day 360 each 3  . clonazePAM (KLONOPIN) 2 MG tablet Take 1 tablet (2 mg total) by mouth at bedtime. FOR SPASTICITY 90 tablet 3  . losartan (COZAAR) 50 MG tablet take 1 tablet by mouth once daily 90 tablet 3  . oxybutynin (DITROPAN) 5 MG tablet Take 5 mg by mouth 4 (four) times daily.     Marland Kitchen tiZANidine (ZANAFLEX) 2 MG tablet Take 2 tablets (4 mg total) by mouth every 8 (eight) hours as needed. 180 tablet 11  . tiZANidine (  ZANAFLEX) 4 MG tablet Take 2 tablets (8 mg total) by mouth 4 (four) times daily as needed. 270 tablet 12   No current facility-administered medications on file prior to visit.   Review of Systems  Constitutional: Negative for unusual diaphoresis or other sweats  HENT: Negative for ringing in ear Eyes: Negative for double vision or worsening visual disturbance.  Respiratory: Negative for choking and stridor.   Gastrointestinal: Negative for vomiting or other signifcant bowel change Genitourinary: Negative for hematuria or decreased urine volume.  Musculoskeletal: Negative for other MSK pain or swelling Skin: Negative for color change and worsening wound.  Neurological: Negative for tremors and numbness other than noted  Psychiatric/Behavioral: Negative for decreased concentration or agitation other than above       Objective:   Physical Exam BP 138/80  mmHg  Pulse 75  Temp(Src) 98.2 F (36.8 C) (Oral)  Wt 180 lb (81.647 kg)  SpO2 95%  Wt Readings from Last 3 Encounters:  10/21/14 180 lb (81.647 kg)  07/23/14 180 lb (81.647 kg)  04/15/14 180 lb (81.647 kg)  VS noted,  Constitutional: Pt appears well-developed, well-nourished.  HENT: Head: NCAT.  Right Ear: External ear normal.  Left Ear: External ear normal.  Eyes: . Pupils are equal, round, and reactive to light. Conjunctivae and EOM are normal Neck: Normal range of motion. Neck supple.  Cardiovascular: Normal rate and regular rhythm.   Pulmonary/Chest: Effort normal and breath sounds normal.  Abd:  Soft, NT, ND, + BS Neurological: Pt is alert. Not confused , motor intact to UE's Skin: Skin is warm. No rash Psychiatric: Pt behavior is normal. No agitation. + irritable    Assessment & Plan:

## 2014-10-21 NOTE — Patient Instructions (Addendum)
You had the Pneumovax pneumonia shot today  Please continue all other medications as before, and refills have been done if requested.  Please have the pharmacy call with any other refills you may need.  Please continue your efforts at being more active, low cholesterol diet, and weight control.  You are otherwise up to date with prevention measures today.  Please keep your appointments with your specialists as you may have planned  Please return in 6 months, or sooner if needed 

## 2014-10-21 NOTE — Progress Notes (Signed)
Pre visit review using our clinic review tool, if applicable. No additional management support is needed unless otherwise documented below in the visit note. 

## 2014-10-22 NOTE — Assessment & Plan Note (Signed)
stable overall by history and exam, recent data reviewed with pt, and pt to continue medical treatment as before,  to f/u any worsening symptoms or concerns BP Readings from Last 3 Encounters:  10/21/14 138/80  08/13/14 119/78  08/05/14 122/64

## 2014-10-22 NOTE — Assessment & Plan Note (Signed)
stable overall by history and exam, recent data reviewed with pt, and pt to continue medical treatment as before,  to f/u any worsening symptoms or concerns Lab Results  Component Value Date   WBC 9.4 04/15/2014   HGB 15.5 04/15/2014   HCT 46.6 04/15/2014   PLT 211.0 04/15/2014   GLUCOSE 89 04/15/2014   CHOL 117 04/15/2014   TRIG 73.0 04/15/2014   HDL 34.50* 04/15/2014   LDLDIRECT 88.3 10/20/2009   LDLCALC 68 04/15/2014   ALT 20 04/15/2014   AST 24 04/15/2014   NA 138 04/15/2014   K 4.4 04/15/2014   CL 106 04/15/2014   CREATININE 0.7 04/15/2014   BUN 15 04/15/2014   CO2 27 04/15/2014   TSH 1.43 04/15/2014   PSA 0.04* 04/04/2013    

## 2014-10-22 NOTE — Assessment & Plan Note (Signed)
delcines need for further tx or counseling at this time,  to f/u any worsening symptoms or concerns

## 2014-11-25 DIAGNOSIS — Z8546 Personal history of malignant neoplasm of prostate: Secondary | ICD-10-CM | POA: Diagnosis not present

## 2014-12-02 DIAGNOSIS — N200 Calculus of kidney: Secondary | ICD-10-CM | POA: Diagnosis not present

## 2014-12-02 DIAGNOSIS — N319 Neuromuscular dysfunction of bladder, unspecified: Secondary | ICD-10-CM | POA: Diagnosis not present

## 2014-12-02 DIAGNOSIS — Z8546 Personal history of malignant neoplasm of prostate: Secondary | ICD-10-CM | POA: Diagnosis not present

## 2014-12-02 DIAGNOSIS — N3281 Overactive bladder: Secondary | ICD-10-CM | POA: Diagnosis not present

## 2014-12-02 DIAGNOSIS — N3946 Mixed incontinence: Secondary | ICD-10-CM | POA: Diagnosis not present

## 2015-01-03 ENCOUNTER — Other Ambulatory Visit: Payer: Self-pay | Admitting: Internal Medicine

## 2015-03-05 ENCOUNTER — Other Ambulatory Visit: Payer: Self-pay

## 2015-03-05 MED ORDER — CLONAZEPAM 2 MG PO TABS: 2.0000 mg | ORAL_TABLET | Freq: Every day | ORAL | Status: DC

## 2015-03-05 NOTE — Telephone Encounter (Signed)
Rx signed and faxed.

## 2015-04-22 ENCOUNTER — Ambulatory Visit (INDEPENDENT_AMBULATORY_CARE_PROVIDER_SITE_OTHER): Payer: Medicare Other | Admitting: Internal Medicine

## 2015-04-22 ENCOUNTER — Encounter: Payer: Self-pay | Admitting: Internal Medicine

## 2015-04-22 ENCOUNTER — Other Ambulatory Visit (INDEPENDENT_AMBULATORY_CARE_PROVIDER_SITE_OTHER): Payer: Medicare Other

## 2015-04-22 VITALS — BP 128/70 | HR 68 | Temp 97.8°F

## 2015-04-22 DIAGNOSIS — F411 Generalized anxiety disorder: Secondary | ICD-10-CM

## 2015-04-22 DIAGNOSIS — F32A Depression, unspecified: Secondary | ICD-10-CM

## 2015-04-22 DIAGNOSIS — R5383 Other fatigue: Secondary | ICD-10-CM

## 2015-04-22 DIAGNOSIS — I10 Essential (primary) hypertension: Secondary | ICD-10-CM

## 2015-04-22 DIAGNOSIS — F329 Major depressive disorder, single episode, unspecified: Secondary | ICD-10-CM

## 2015-04-22 LAB — URINALYSIS, ROUTINE W REFLEX MICROSCOPIC
Bilirubin Urine: NEGATIVE
HGB URINE DIPSTICK: NEGATIVE
KETONES UR: NEGATIVE
Leukocytes, UA: NEGATIVE
Nitrite: NEGATIVE
RBC / HPF: NONE SEEN (ref 0–?)
Renal Epithel, UA: NONE SEEN
SPECIFIC GRAVITY, URINE: 1.015 (ref 1.000–1.030)
Total Protein, Urine: NEGATIVE
URINE GLUCOSE: NEGATIVE
Urobilinogen, UA: 0.2 (ref 0.0–1.0)
pH: 7.5 (ref 5.0–8.0)

## 2015-04-22 LAB — CBC WITH DIFFERENTIAL/PLATELET
BASOS PCT: 0.5 % (ref 0.0–3.0)
Basophils Absolute: 0 10*3/uL (ref 0.0–0.1)
EOS PCT: 3.9 % (ref 0.0–5.0)
Eosinophils Absolute: 0.3 10*3/uL (ref 0.0–0.7)
HCT: 46.6 % (ref 39.0–52.0)
Hemoglobin: 15.3 g/dL (ref 13.0–17.0)
LYMPHS PCT: 19.6 % (ref 12.0–46.0)
Lymphs Abs: 1.6 10*3/uL (ref 0.7–4.0)
MCHC: 32.8 g/dL (ref 30.0–36.0)
MCV: 89.4 fl (ref 78.0–100.0)
Monocytes Absolute: 0.6 10*3/uL (ref 0.1–1.0)
Monocytes Relative: 8.1 % (ref 3.0–12.0)
Neutro Abs: 5.4 10*3/uL (ref 1.4–7.7)
Neutrophils Relative %: 67.9 % (ref 43.0–77.0)
Platelets: 217 10*3/uL (ref 150.0–400.0)
RBC: 5.21 Mil/uL (ref 4.22–5.81)
RDW: 14.1 % (ref 11.5–15.5)
WBC: 7.9 10*3/uL (ref 4.0–10.5)

## 2015-04-22 LAB — LIPID PANEL
Cholesterol: 121 mg/dL (ref 0–200)
HDL: 30.9 mg/dL — ABNORMAL LOW (ref >39.00)
LDL CALC: 52 mg/dL (ref 0–99)
NonHDL: 90.1
Total CHOL/HDL Ratio: 4
Triglycerides: 190 mg/dL — ABNORMAL HIGH (ref 0.0–149.0)
VLDL: 38 mg/dL (ref 0.0–40.0)

## 2015-04-22 LAB — HEPATIC FUNCTION PANEL
ALBUMIN: 4.1 g/dL (ref 3.5–5.2)
ALT: 19 U/L (ref 0–53)
AST: 18 U/L (ref 0–37)
Alkaline Phosphatase: 55 U/L (ref 39–117)
Bilirubin, Direct: 0.1 mg/dL (ref 0.0–0.3)
Total Bilirubin: 0.5 mg/dL (ref 0.2–1.2)
Total Protein: 6.3 g/dL (ref 6.0–8.3)

## 2015-04-22 LAB — BASIC METABOLIC PANEL
BUN: 12 mg/dL (ref 6–23)
CALCIUM: 9 mg/dL (ref 8.4–10.5)
CO2: 28 meq/L (ref 19–32)
Chloride: 105 mEq/L (ref 96–112)
Creatinine, Ser: 0.83 mg/dL (ref 0.40–1.50)
GFR: 98.16 mL/min (ref >60.00)
GLUCOSE: 92 mg/dL (ref 70–99)
Potassium: 4 mEq/L (ref 3.5–5.1)
SODIUM: 137 meq/L (ref 135–145)

## 2015-04-22 LAB — TSH: TSH: 1.57 u[IU]/mL (ref 0.35–4.50)

## 2015-04-22 MED ORDER — LOSARTAN POTASSIUM 25 MG PO TABS: 25.0000 mg | ORAL_TABLET | Freq: Every day | ORAL | Status: DC

## 2015-04-22 NOTE — Assessment & Plan Note (Signed)
Etiology unclear, Exam otherwise benign, to check labs as documented, follow with expectant management  

## 2015-04-22 NOTE — Progress Notes (Signed)
Pre visit review using our clinic review tool, if applicable. No additional management support is needed unless otherwise documented below in the visit note. 

## 2015-04-22 NOTE — Patient Instructions (Addendum)
OK to reduce the losartan to 25 mg  Please continue all other medications as before, and refills have been done if requested.  Please have the pharmacy call with any other refills you may need.  Please continue your efforts at being more active, low cholesterol diet, and weight control.  You are otherwise up to date with prevention measures today.  Please keep your appointments with your specialists as you may have planned  Please go to the LAB in the Basement (turn left off the elevator) for the tests to be done today  You will be contacted by phone if any changes need to be made immediately.  Otherwise, you will receive a letter about your results with an explanation, but please check with MyChart first.  Please remember to sign up for MyChart if you have not done so, as this will be important to you in the future with finding out test results, communicating by private email, and scheduling acute appointments online when needed.  Please return in 6 months, or sooner if needed

## 2015-04-22 NOTE — Progress Notes (Signed)
Subjective:    Patient ID: David Kim, male    DOB: [birth year], 69 y.o.   MRN: [id number]  HPI    Here for yearly f/u;  Overall doing ok;  Pt denies Chest pain, worsening SOB, DOE, wheezing, orthopnea, PND, worsening LE edema, palpitations, dizziness or syncope.  Pt denies neurological change such as new headache, facial or extremity weakness.  Pt denies polydipsia, polyuria, or low sugar symptoms. Pt states overall good compliance with treatment and medications, good tolerability, and has been trying to follow appropriate diet.  Pt denies worsening depressive symptoms, suicidal ideation or panic. No fever, night sweats, wt loss, loss of appetite, or other constitutional symptoms.  Pt states good ability with ADL's, has low fall risk, home safety reviewed and adequate, no other significant changes in hearing or vision, remains wheelchair bound,   Had recent PSA per Dr Jeffie Pollock.  No new compalaints,  May be getting back some strength to prox legs   Had some low BP this and yesterday , 90/64, slightly dizzy and dimming eyesight, hard to think . Does c/o ongoing fatigue, but denies signficant daytime hypersomnolence.   Past Medical History  Diagnosis Date  . Low back pain   . Gout   . IBS (irritable bowel syndrome)   . Peptic ulcer disease   . Allergic rhinitis   . Depression   . Depression   . Anxiety   . Erectile dysfunction   . OSA (obstructive sleep apnea)   . GERD (gastroesophageal reflux disease)   . Prostate cancer   . Hypertension   . Carpal tunnel syndrome     right  . Fatigue   . Paraplegia 04/23/2009    Qualifier: Diagnosis of  By: Jenny Reichmann MD, Hunt Oris   . PROSTATE CANCER, HX OF 05/23/2007    Qualifier: Diagnosis of  By: Jenny Reichmann MD, [place] PARALYSIS 04/23/2009    Qualifier: Diagnosis of  By: Jenny Reichmann MD, Hunt Oris   . Thoracic spinal cord injury 03/12/2012  . ALLERGIC RHINITIS 10/01/2007    Qualifier: Diagnosis of  By: Jenny Reichmann MD, Hunt Oris   . HYPOGONADISM 03/31/2008    Qualifier:  Diagnosis of  By: Jenny Reichmann MD, Hunt Oris   . PEPTIC ULCER DISEASE 10/01/2007    Qualifier: Diagnosis of  By: Jenny Reichmann MD, Hunt Oris    Past Surgical History  Procedure Laterality Date  . Prostatectomy    . Inguinal heniorrhaphy    . Tonsillectomy    . Pilonidal cyst / sinus excision    . Left leg surgery after fibula      reports that he has never smoked. He has never used smokeless tobacco. He reports that he does not drink alcohol or use illicit drugs. family history includes Dementia in his mother; Diabetes in his father; High blood pressure in his mother. Allergies  Allergen Reactions  . Amoxicillin   . Endal Hd    Current Outpatient Prescriptions on File Prior to Visit  Medication Sig Dispense Refill  . aspirin (ECOTRIN LOW STRENGTH) 81 MG EC tablet Take 81 mg by mouth daily.      . baclofen (LIORESAL) 20 MG tablet Take 1 tablet (20 mg total) by mouth 4 (four) times daily. One and 1/2 tabs four times a day 360 each 3  . clonazePAM (KLONOPIN) 2 MG tablet Take 1 tablet (2 mg total) by mouth at bedtime. FOR SPASTICITY 90 tablet 1  . losartan (COZAAR) 50 MG tablet take 1 tablet by  mouth once daily 90 tablet 3  . oxybutynin (DITROPAN) 5 MG tablet Take 5 mg by mouth 4 (four) times daily.     Marland Kitchen tiZANidine (ZANAFLEX) 4 MG tablet Take 2 tablets (8 mg total) by mouth 4 (four) times daily as needed. 270 tablet 12  . tiZANidine (ZANAFLEX) 2 MG tablet Take 2 tablets (4 mg total) by mouth every 8 (eight) hours as needed. (Patient not taking: Reported on 04/22/2015) 180 tablet 11   No current facility-administered medications on file prior to visit.    Review of Systems  Constitutional: Negative for increased diaphoresis, other activity, appetite or siginficant weight change other than noted HENT: Negative for worsening hearing loss, ear pain, facial swelling, mouth sores and neck stiffness.   Eyes: Negative for other worsening pain, redness or visual disturbance.  Respiratory: Negative for shortness of  breath and wheezing  Cardiovascular: Negative for chest pain and palpitations.  Gastrointestinal: Negative for diarrhea, blood in stool, abdominal distention or other pain Genitourinary: Negative for hematuria, flank pain or change in urine volume.  Musculoskeletal: Negative for myalgias or other joint complaints.  Skin: Negative for color change and wound or drainage.  Neurological: Negative for syncope and numbness. other than noted Hematological: Negative for adenopathy. or other swelling Psychiatric/Behavioral: Negative for hallucinations, SI, self-injury, decreased concentration or other worsening agitation.      Objective:   Physical Exam BP 128/70 mmHg  Pulse 68  Temp(Src) 97.8 F (36.6 C) (Oral)  SpO2 97% VS noted,  Constitutional: Pt is oriented to person, place, and time. Appears well-developed and well-nourished, in no significant distress Head: Normocephalic and atraumatic.  Right Ear: External ear normal.  Left Ear: External ear normal.  Nose: Nose normal.  Mouth/Throat: Oropharynx is clear and moist.  Eyes: Conjunctivae and EOM are normal. Pupils are equal, round, and reactive to light.  Neck: Normal range of motion. Neck supple. No JVD present. No tracheal deviation present or significant neck LA or mass Cardiovascular: Normal rate, regular rhythm, normal heart sounds and intact distal pulses.   Pulmonary/Chest: Effort normal and breath sounds without rales or wheezing  Abdominal: Soft. Bowel sounds are normal. NT. No HSM  Musculoskeletal: Normal range of motion. Exhibits chronic trace to 1+ bilat LE edema.  Lymphadenopathy:  Has no cervical adenopathy.  Neurological: Pt is alert and oriented to person, place, and time. Pt has normal reflexes. No cranial nerve deficit. Motor grossly intactvexcept LE paralysis Skin: Skin is warm and dry. No rash noted.  Psychiatric:  Has somewhat dysphoric mood and affect. Behavior is normal.         Assessment & Plan:

## 2015-04-22 NOTE — Assessment & Plan Note (Signed)
stable overall by history and exam, recent data reviewed with pt, and pt to continue medical treatment as before,  to f/u any worsening symptoms or concerns Lab Results  Component Value Date   WBC 9.4 04/15/2014   HGB 15.5 04/15/2014   HCT 46.6 04/15/2014   PLT 211.0 04/15/2014   GLUCOSE 89 04/15/2014   CHOL 117 04/15/2014   TRIG 73.0 04/15/2014   HDL 34.50* 04/15/2014   LDLDIRECT 88.3 10/20/2009   LDLCALC 68 04/15/2014   ALT 20 04/15/2014   AST 24 04/15/2014   NA 138 04/15/2014   K 4.4 04/15/2014   CL 106 04/15/2014   CREATININE 0.7 04/15/2014   BUN 15 04/15/2014   CO2 27 04/15/2014   TSH 1.43 04/15/2014   PSA 0.04* 04/04/2013

## 2015-04-22 NOTE — Assessment & Plan Note (Signed)
[  possibly overcontrolled  - for decreased losaran 25 mg qd,  to f/u any worsening symptoms or concerns

## 2015-04-22 NOTE — Assessment & Plan Note (Signed)
stable overall by history and exam and pt to continue medical treatment as before,  to f/u any worsening symptoms or concerns  

## 2015-04-30 ENCOUNTER — Encounter: Payer: Medicare Other | Attending: Physical Medicine & Rehabilitation | Admitting: Physical Medicine & Rehabilitation

## 2015-04-30 ENCOUNTER — Encounter: Payer: Self-pay | Admitting: Physical Medicine & Rehabilitation

## 2015-04-30 VITALS — BP 116/74 | HR 76 | Resp 14

## 2015-04-30 DIAGNOSIS — G839 Paralytic syndrome, unspecified: Secondary | ICD-10-CM | POA: Diagnosis not present

## 2015-04-30 DIAGNOSIS — S24109S Unspecified injury at unspecified level of thoracic spinal cord, sequela: Secondary | ICD-10-CM | POA: Diagnosis not present

## 2015-04-30 MED ORDER — CLONAZEPAM 2 MG PO TABS: 2.0000 mg | ORAL_TABLET | Freq: Every day | ORAL | Status: DC

## 2015-04-30 NOTE — Progress Notes (Signed)
Subjective:    Patient ID: David Kim, male    DOB: Nov 13, 1945, 69 y.o.   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 He has noticed some movement in his hips which has helped with transfers. He hasn't seen any change distally. He has ongoing tone which his generally stable in both legs. He does sometimes struggle at night with spasms and sleep.   Bowel and bladder function have remained fairly steady. He is on a morning bowel program. He is able to sense when he needs to empty and uses a urinal to empty. He's had no UTI"s.   He uses his standing frame for exercise and to facilitate his proximal movement. He just finished working on a [employer] he had started before his hospitalization  Pain Inventory Average Pain 0 Pain Right Now 0 My pain is no pain  In the last 24 hours, has pain interfered with the following? General activity 0 Relation with others 0 Enjoyment of life 0 What TIME of day is your pain at its worst? no pain Sleep (in general) Fair  Pain is worse with: no pain Pain improves with: no pain Relief from Meds: no pain  Mobility ability to climb steps?  no do you drive?  no use a wheelchair transfers alone Do you have any goals in this area?  yes  Function retired I need assistance with the following:  bathing, toileting, meal prep, household duties and shopping Do you have any goals in this area?  yes  Neuro/Psych bladder control problems weakness numbness trouble walking spasms  Prior Studies Any changes since last visit?  no  Physicians involved in your care Any changes since last visit?  no   Family History  Problem Relation Age of Onset  . Diabetes Father   . High blood pressure Mother   . Dementia Mother    History   Social History  . Marital Status: Married    Spouse Name: [name]  . Number of Children: 0  . Years of Education: 12   Occupational History  . retired   .       Social History Main Topics  . Smoking status: Never Smoker   . Smokeless tobacco: Never Used  . Alcohol Use: No  . Drug Use: No  . Sexual Activity: Not on file   Other Topics Concern  . None   Social History Narrative   Patient lives at home with [name]). Patient is disabled. Patient has 12 th grade education.   Right handed.   Caffeine- None   Past Surgical History  Procedure Laterality Date  . Prostatectomy    . Inguinal heniorrhaphy    . Tonsillectomy    . Pilonidal cyst / sinus excision    . Left leg surgery after fibula     Past Medical History  Diagnosis Date  . Low back pain   . Gout   . IBS (irritable bowel syndrome)   . Peptic ulcer disease   . Allergic rhinitis   . Depression   . Depression   . Anxiety   . Erectile dysfunction   . OSA (obstructive sleep apnea)   . GERD (gastroesophageal reflux disease)   . Prostate cancer   . Hypertension   . Carpal tunnel syndrome     right  . Fatigue   . Paraplegia 04/23/2009    Qualifier: Diagnosis of  By: Jenny Reichmann MD, Jeneen Rinks  W   . PROSTATE CANCER, HX OF 05/23/2007    Qualifier: Diagnosis of  By: Jenny Reichmann MD, [place] PARALYSIS 04/23/2009    Qualifier: Diagnosis of  By: Jenny Reichmann MD, Hunt Oris   . Thoracic spinal cord injury 03/12/2012  . ALLERGIC RHINITIS 10/01/2007    Qualifier: Diagnosis of  By: Jenny Reichmann MD, Hunt Oris   . HYPOGONADISM 03/31/2008    Qualifier: Diagnosis of  By: Jenny Reichmann MD, Hunt Oris   . PEPTIC ULCER DISEASE 10/01/2007    Qualifier: Diagnosis of  By: Jenny Reichmann MD, Hunt Oris    BP 116/74 mmHg  Pulse 76  Resp 14  SpO2 96%  Opioid Risk Score:   Fall Risk Score:  `1  Depression screen PHQ 2/9  Depression screen [facility] 2/9 04/22/2015 04/15/2014 10/14/2013  Decreased Interest 0 0 0  Down, Depressed, Hopeless 0 0 1  PHQ - 2 Score 0 0 1     Review of Systems  Respiratory: Positive for apnea.   Cardiovascular: Positive for leg swelling.  Gastrointestinal: Positive for constipation.  Genitourinary:        Urine retention  Musculoskeletal: Positive for gait problem.  Neurological: Positive for weakness and numbness.       Spasms  All other systems reviewed and are negative.      Objective:   Physical Exam  Constitutional: He appears well-developed and well-nourished.  HENT:  Head: Normocephalic and atraumatic.  Eyes: Conjunctivae and EOM are normal. Pupils are equal, round, and reactive to light.  Neck: Normal range of motion. Neck supple.  Cardiovascular: Normal rate and regular rhythm.  Pulmonary/Chest: Effort normal and breath sounds normal.  Neurological:  No volitional movement in the legs except for trace hip control (honestly, it's difficult to tell as he uses his trunk to throw the hips forward and back)   He has no sensation below the level of injury. He still has 0 resting tone in the legs. DTR's are hyperactive. He has sustained clonus in both ankles with ROM . UE strength is normal.   Sitting posture is good. UE strength is 5/5.  Skin:  large area of scar over the medial thigh which has nicely healed.  Psychiatric: He has a normal mood and affect. His behavior is normal. Judgment and thought content normal.  Assessment & Plan:   ASSESSMENT:  1. 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  2. Hypertension.  3. Left thigh wound which appears healed and is scarred over.    PLAN:  1. The patient is doing very well with his spasticity management. We  will continue with his baclofen, tizanidine.    -recommend LFT check sometime over the next couple months.  2. Increase klonopin to 2-3mg  qhs for spasms/sleep.  3. Continue with standing frame and home exercises. Aquatic activity would also be good for him. 4. We will continue with yearly follow up.  5. 15 minutes of face to face patient care time were spent during this visit. All questions were encouraged and answered.

## 2015-04-30 NOTE — Patient Instructions (Signed)
PLEASE CALL ME WITH ANY PROBLEMS OR QUESTIONS (#[phone]).  HAVE A GOOD DAY!   CONSIDER SOME AQUATIC BASED ACTIVITIES IF YOU HAVE A POOL AVAILABLE.

## 2015-05-03 ENCOUNTER — Other Ambulatory Visit: Payer: Self-pay

## 2015-05-03 MED ORDER — BACLOFEN 20 MG PO TABS: 20.0000 mg | ORAL_TABLET | Freq: Four times a day (QID) | ORAL | Status: DC

## 2015-05-13 ENCOUNTER — Telehealth: Payer: Self-pay | Admitting: Neurology

## 2015-05-13 MED ORDER — BACLOFEN 10 MG PO TABS: 15.0000 mg | ORAL_TABLET | Freq: Four times a day (QID) | ORAL | Status: DC

## 2015-05-13 NOTE — Telephone Encounter (Signed)
Last OV note says: will call in RX for baclofen, 20 qid; He is on both baclofen 20+10mg + 5mg  qid and tizanidine 4mg  2 tabs q6hours, clonazepam 2mg  1/2 qam, one qhs.  I called the patient back.  He confirmed he is still taking Baclofen 20mg  QID + 1 and 1/2 10mg  QID.  States stopped refilling the Tizanidine a few months ago because he does not wish to take it at this time.  Indicates Dr Tessa Lerner is filling Clonazepam.

## 2015-05-13 NOTE — Telephone Encounter (Signed)
Patient called requesting a refill on his medication Rx. BACLOFEN 10 mg. Please call and advise.

## 2015-05-24 ENCOUNTER — Telehealth: Payer: Self-pay | Admitting: Internal Medicine

## 2015-05-24 NOTE — Telephone Encounter (Signed)
Wife states that she got a jury summons from [facility].  She is requesting a letter from Dr. Jenny Reichmann stating husbands disabilities and wife being primary care taker to excuse her from duty.

## 2015-05-25 ENCOUNTER — Telehealth: Payer: Self-pay | Admitting: *Deleted

## 2015-05-25 NOTE — Telephone Encounter (Signed)
Letter written

## 2015-05-25 NOTE — Telephone Encounter (Signed)
Pt's wife received a jury summons. She states that she is the sole primary caregiver for her husband and cannot leave him alone.  She is asking for a letter to excuse her from jury duty.

## 2015-05-25 NOTE — Telephone Encounter (Signed)
Notified [name] that letter ready.  She will pick letter up at front desk.

## 2015-05-27 NOTE — Telephone Encounter (Signed)
Unfortunately I do no recall the wife's name, so am unable to do this request

## 2015-05-28 ENCOUNTER — Encounter: Payer: Self-pay | Admitting: Internal Medicine

## 2015-05-28 NOTE — Telephone Encounter (Signed)
Letter - Done hardcopy to [facility]

## 2015-05-28 NOTE — Telephone Encounter (Signed)
Spouse advised, letter placed in upfront cabinet for pick up per request

## 2015-06-02 DIAGNOSIS — Z8546 Personal history of malignant neoplasm of prostate: Secondary | ICD-10-CM | POA: Diagnosis not present

## 2015-08-05 ENCOUNTER — Telehealth: Payer: Self-pay | Admitting: Internal Medicine

## 2015-08-05 MED ORDER — LOSARTAN POTASSIUM 50 MG PO TABS: 50.0000 mg | ORAL_TABLET | Freq: Every day | ORAL | Status: DC

## 2015-08-05 NOTE — Telephone Encounter (Signed)
Pt called in and has a few question about changing the dosage of some of his meds  Best number [phone]

## 2015-08-05 NOTE — Telephone Encounter (Signed)
Done

## 2015-08-05 NOTE — Telephone Encounter (Signed)
Pt called stating that his BP has been elevated at 180's/100's x 6 weeks so he increased Losartan 50mg . His BP is now within normal ranges. He is requesting MD advise on increasing his medication, please advise

## 2015-08-11 ENCOUNTER — Ambulatory Visit: Payer: Medicare Other | Admitting: Pulmonary Disease

## 2015-08-16 ENCOUNTER — Ambulatory Visit (INDEPENDENT_AMBULATORY_CARE_PROVIDER_SITE_OTHER): Payer: Medicare Other | Admitting: Nurse Practitioner

## 2015-08-16 ENCOUNTER — Encounter: Payer: Self-pay | Admitting: Nurse Practitioner

## 2015-08-16 VITALS — BP 112/70 | HR 89 | Ht 68.0 in | Wt 190.0 lb

## 2015-08-16 DIAGNOSIS — G8220 Paraplegia, unspecified: Secondary | ICD-10-CM | POA: Diagnosis not present

## 2015-08-16 DIAGNOSIS — G839 Paralytic syndrome, unspecified: Secondary | ICD-10-CM | POA: Diagnosis not present

## 2015-08-16 DIAGNOSIS — R269 Unspecified abnormalities of gait and mobility: Secondary | ICD-10-CM

## 2015-08-16 MED ORDER — BACLOFEN 10 MG PO TABS: 15.0000 mg | ORAL_TABLET | Freq: Four times a day (QID) | ORAL | Status: DC

## 2015-08-16 MED ORDER — BACLOFEN 20 MG PO TABS: 20.0000 mg | ORAL_TABLET | Freq: Four times a day (QID) | ORAL | Status: DC

## 2015-08-16 NOTE — Patient Instructions (Signed)
Continue Baclofen at current dose will refill F/U yearly and prn

## 2015-08-16 NOTE — Progress Notes (Signed)
[facility]  PATIENT: David Kim DOB: Jul 21, 1946   REASON FOR VISIT:  Follow-up for paraplegia , due to T 10 cord injury, abnormality of gait HISTORY FROM: patient    HISTORY OF PRESENT ILLNESS: HISTORY:David Kim came in to refill his medications, he is accompanied by his wife, sitting wheelchair David Kim developed weakness in his legs in 2009. He slipped on ice in January 2009 and fractures ankles requiring pinning procedures. Following that time he developed progressive leg weakness. 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 It became progressively less safe for him to do that. He began to experience difficulty getting out of a car.  He had MRI imaging studies of his head, cervical, and lumbar spine. 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 Following the procedure, the patient became paraplegic, a hematoma was found in the operative site when he was reopened. He did not regain his ability to move his legs which he had been able to do preoperatively.He is a sensory level below the umbilicus. He has neurogenic bowel and bladder and wears diapers all the time. Occasional UTI. He was evaluated by Dr. Gaynell Face, deemed not a baclofen pump candidate due to nonambulatory, no significant pain. The patient has spasticity in his lower extremities and his torso, but he is not experiencing pain when he has spasms. The spasticity does not awaken him at night time. His tone has not increased to the point where it is difficult to dress him, perform hygiene, or to place him in his wheelchair. He self transfer himself in and out of wheelchair. He is on both baclofen 20+70m+ 571mqid and tizanidine 20m30m tabs q6hours, clonazepam 2mg71m2 qam, one qhs. He complains of dry mouth, also taking oxybutynin, but no excessive drowsiness, wife has been stretching his legs on daily  basis. He has no significant bilateral lower extremity movement, sensory level from umbilical down no significant pain.  UPDATE Oct 8th 2015:He has lower extremity spasm, which is overall manageable recurrent baclofen 20 mg, 1 and half tablets 4 times a day, tizanidine as needed, clonazepam 2 mg every night Today he also reported episode of seeing spider webs in his visual field, with associated blurry vision, mild confusion, lasting 10-15 minutes, there was no associated headaches, which raised the possibility of possible partial seizure, vs. visual aura without migraine, He denies a previous history of migraine, this episode happened 3-4 times each year. He denied previous history of seizure  UPDATE 08/16/15 David Kim 69r old male returns for follow up-he is a paraplegic from intramedullary tumor at T10 which was removed then he became paralyzed after the surgery. He has a neurogenic bowel and bladder. He continues to have significant spasticity in his lower extremities and his torso. He denies any pain with the spasms. He sleeps well during the night. He transfers himself out of his wheelchair with a transfer board. He is on both baclofen and tizanidine and clonazepam by [name], questionable seizure activity however EEG was normal and MRI of the brain did not show any acute process.. HeMarland Kitchenreturns for reevaluation  REVIEW OF SYSTEMS: Full 14 system review of systems performed and notable only for those listed, all others are neg:  Constitutional: neg  Cardiovascular: leg swelling Ear/Nose/Throat: hearing loss Skin: neg Eyes: light sensitivity Respiratory: neg Gastroitestinal: neg  Genitourinary  bowel  program Hematology/Lymphatic: neg  Endocrine: neg Musculoskeletal:neg Allergy/Immunology: neg Neurological: weakness of the lower extremities,  Psychiatric: neg Sleep : neg   ALLERGIES: Allergies  Allergen Reactions  .  Amoxicillin   . Endal Hd     HOME MEDICATIONS: Outpatient Prescriptions Prior to Visit  Medication Sig Dispense Refill  . aspirin (ECOTRIN LOW STRENGTH) 81 MG EC tablet Take 81 mg by mouth daily.      . baclofen (LIORESAL) 10 MG tablet Take 1.5 tablets (15 mg total) by mouth 4 (four) times daily. (Patient taking differently: Take 10 mg by mouth 4 (four) times daily. ) 540 tablet 1  . baclofen (LIORESAL) 20 MG tablet Take 1 tablet (20 mg total) by mouth 4 (four) times daily. 360 tablet 1  . clonazePAM (KLONOPIN) 2 MG tablet Take 1-1.5 tablets (2-3 mg total) by mouth at bedtime. FOR SPASTICITY 135 tablet 1  . losartan (COZAAR) 50 MG tablet Take 1 tablet (50 mg total) by mouth daily. 90 tablet 3  . oxybutynin (DITROPAN) 5 MG tablet Take 5 mg by mouth 4 (four) times daily.     Marland Kitchen tiZANidine (ZANAFLEX) 4 MG tablet Take 2 tablets (8 mg total) by mouth 4 (four) times daily as needed. 270 tablet 12   No facility-administered medications prior to visit.    PAST MEDICAL HISTORY: Past Medical History  Diagnosis Date  . Low back pain   . Gout   . IBS (irritable bowel syndrome)   . Peptic ulcer disease   . Allergic rhinitis   . Depression   . Depression   . Anxiety   . Erectile dysfunction   . OSA (obstructive sleep apnea)   . GERD (gastroesophageal reflux disease)   . Prostate cancer ([place])   . Hypertension   . Carpal tunnel syndrome     right  . Fatigue   . Paraplegia ([place]) 04/23/2009    Qualifier: Diagnosis of  By: Jenny Reichmann MD, Hunt Oris   . PROSTATE CANCER, HX OF 05/23/2007    Qualifier: Diagnosis of  By: Jenny Reichmann MD, [place] PARALYSIS 04/23/2009    Qualifier: Diagnosis of  By: Jenny Reichmann MD, Hunt Oris   . Thoracic spinal cord injury ([place]) 03/12/2012  . ALLERGIC RHINITIS 10/01/2007    Qualifier: Diagnosis of  By: Jenny Reichmann MD, Hunt Oris   . HYPOGONADISM 03/31/2008    Qualifier: Diagnosis of  By: Jenny Reichmann MD, Hunt Oris   . PEPTIC ULCER DISEASE 10/01/2007    Qualifier: Diagnosis of  By: Jenny Reichmann MD, Hunt Oris       PAST SURGICAL HISTORY: Past Surgical History  Procedure Laterality Date  . Prostatectomy    . Inguinal heniorrhaphy    . Tonsillectomy    . Pilonidal cyst / sinus excision    . Left leg surgery after fibula      FAMILY HISTORY: Family History  Problem Relation Age of Onset  . Diabetes Father   . High blood pressure Mother   . Dementia Mother     SOCIAL HISTORY: Social History   Social History  . Marital Status: Married    Spouse Name: David Kim  . Number of Children: 0  . Years of Education: 12   Occupational History  . retired   .     Social History Main Topics  . Smoking status: Never Smoker   . Smokeless tobacco: Never Used  . Alcohol Use: No  . Drug Use: No  . Sexual Activity: Not on file  Other Topics Concern  . Not on file   Social History Narrative   Patient lives at home with his wife David Kim). Patient is disabled. Patient has 12 th grade education.   Right handed.   Caffeine- None     PHYSICAL EXAM  Filed Vitals:   08/16/15 1319  BP: 112/70  Pulse: 89  Height: '5\' 8"'  (1.727 m)  Weight: 190 lb (86.183 kg)   Body mass index is 28.9 kg/(m^2). General: alert, well developed, well nourished, in no acute distress Neck: supple no carotid bruits Respiratory: clear to auscultation bilaterally Cardiovascular: regular rate rhythm Skin: no rashes   Neurologic Exam  Mental Status: mild obese, in wheel chair Cranial Nerves: CN II-XII pupils were equal round reactive to light. Extraocular movements were full. Visual fields were full on confrontational test. Facial sensation and strength were normal. Hearing was intact to finger rubbing bilaterally. Uvula tongue were midline. Head turning and shoulder shrugging were normal and symmetric. Tongue protrusion into the cheeks strength were normal.  Motor: very strong upper extremity muscles, normal tone, mild to moderate spasticity of lower extremity, minimal hip flexion movement, mild bilateral  knee contraction to 175 degree, ankle plantar flexion. Sensory: sensory level to T10 bilaterally. Coordination: good finger-to-nose, rapid repetitive alternating movements and finger apposition  Gait and Station: can not bear weight in wheelchair Reflexes: Deep tendon reflexes: Biceps: 2/2, Brachioradialis: 2/2, Triceps: 2/2, Pateller: 2+/2+, sustained bilateral ankle clonus    DIAGNOSTIC DATA (LABS, IMAGING, TESTING) - I reviewed patient records, labs, notes, testing and imaging myself where available.  Lab Results  Component Value Date   WBC 7.9 04/22/2015   HGB 15.3 04/22/2015   HCT 46.6 04/22/2015   MCV 89.4 04/22/2015   PLT 217.0 04/22/2015      Component Value Date/Time   NA 137 04/22/2015 1457   K 4.0 04/22/2015 1457   CL 105 04/22/2015 1457   CO2 28 04/22/2015 1457   GLUCOSE 92 04/22/2015 1457   BUN 12 04/22/2015 1457   CREATININE 0.83 04/22/2015 1457   CALCIUM 9.0 04/22/2015 1457   PROT 6.3 04/22/2015 1457   ALBUMIN 4.1 04/22/2015 1457   AST 18 04/22/2015 1457   ALT 19 04/22/2015 1457   ALKPHOS 55 04/22/2015 1457   BILITOT 0.5 04/22/2015 1457   GFRNONAA 105.37 11/03/2010 1528   GFRAA  08/27/2010 0425    >60        The eGFR has been calculated using the MDRD equation. This calculation has not been validated in all clinical situations. eGFR's persistently <60 mL/min signify possible Chronic Kidney Disease.   Lab Results  Component Value Date   CHOL 121 04/22/2015   HDL 30.90* 04/22/2015   LDLCALC 52 04/22/2015   LDLDIRECT 88.3 10/20/2009   TRIG 190.0* 04/22/2015   CHOLHDL 4 04/22/2015    Lab Results  Component Value Date   TSH 1.57 04/22/2015      ASSESSMENT AND PLAN  69 y.o. year old male  has a past medical history of  Paraplegia ([place]) (04/23/2009);  SPASTIC PARALYSIS (04/23/2009); Thoracic spinal cord injury ([place]) (03/12/2012); here to follow up.He is doing very well managing his spasticity. Reviewed  recent liver functions 04/22/15 within  normal limits   Continue Baclofen at current dose will refill Continue tizanidine at current dose Continue clonazepam at current dose refilled by Dr. Tessa Lerner Continue to use standing frame for exercise 15 minutes of face to face time spent with this patient during the visit, questions were answered Patient has  not had further visual disturbance questionable seizure activity, made aware EEG was normal and MRI without acute changes  F/U yearly and prn Dennie Bible, [facility], [facility], APRN  [facility] [address], [place], [place] [address] [phone]

## 2015-08-20 NOTE — Progress Notes (Signed)
I have reviewed and agreed above plan. 

## 2015-08-25 DIAGNOSIS — Z8546 Personal history of malignant neoplasm of prostate: Secondary | ICD-10-CM | POA: Diagnosis not present

## 2015-08-25 DIAGNOSIS — N3946 Mixed incontinence: Secondary | ICD-10-CM | POA: Diagnosis not present

## 2015-08-25 DIAGNOSIS — N319 Neuromuscular dysfunction of bladder, unspecified: Secondary | ICD-10-CM | POA: Diagnosis not present

## 2015-09-02 ENCOUNTER — Telehealth: Payer: Self-pay | Admitting: Internal Medicine

## 2015-09-02 NOTE — Telephone Encounter (Signed)
Patient states his blood pressure has changed and he has questions about his medication.  Please follow up with in regards.

## 2015-09-03 DIAGNOSIS — H524 Presbyopia: Secondary | ICD-10-CM | POA: Diagnosis not present

## 2015-09-03 DIAGNOSIS — H52222 Regular astigmatism, left eye: Secondary | ICD-10-CM | POA: Diagnosis not present

## 2015-09-03 DIAGNOSIS — H5213 Myopia, bilateral: Secondary | ICD-10-CM | POA: Diagnosis not present

## 2015-09-03 DIAGNOSIS — H2513 Age-related nuclear cataract, bilateral: Secondary | ICD-10-CM | POA: Diagnosis not present

## 2015-09-03 NOTE — Telephone Encounter (Signed)
Pt states that he has had to increase his BP medication to sometimes 3 tablets daily for BP control. Pt advised and transferred to make OV for BP Eval

## 2015-09-07 ENCOUNTER — Telehealth: Payer: Self-pay

## 2015-09-07 ENCOUNTER — Ambulatory Visit (INDEPENDENT_AMBULATORY_CARE_PROVIDER_SITE_OTHER): Payer: Medicare Other

## 2015-09-07 VITALS — BP 142/80

## 2015-09-07 DIAGNOSIS — Z23 Encounter for immunization: Secondary | ICD-10-CM | POA: Diagnosis not present

## 2015-09-07 NOTE — Telephone Encounter (Signed)
Patient came in for bp check (nurse visit), bp reading was 142/80---but patient stated he has been dosing himself (increased losartan tabs to sometimes 3 or 4 extra tabs each day in order to get blood pressure down), patient has scheduled office visit to see dr Jenny Reichmann on nov 2nd at 10:00am, patient also requested rx refill on losartan tabs, i am NOT sending any other refills until after patient sees dr Jenny Reichmann on 09/08/15---routed to dr Jenny Reichmann, Juluis Rainier.Marland KitchenMarland KitchenMarland Kitchen

## 2015-09-08 ENCOUNTER — Encounter: Payer: Self-pay | Admitting: Internal Medicine

## 2015-09-08 ENCOUNTER — Ambulatory Visit (INDEPENDENT_AMBULATORY_CARE_PROVIDER_SITE_OTHER): Payer: Medicare Other | Admitting: Internal Medicine

## 2015-09-08 VITALS — BP 140/80 | HR 78 | Temp 98.4°F

## 2015-09-08 DIAGNOSIS — I10 Essential (primary) hypertension: Secondary | ICD-10-CM

## 2015-09-08 MED ORDER — LOSARTAN POTASSIUM 100 MG PO TABS: 100.0000 mg | ORAL_TABLET | Freq: Every day | ORAL | Status: DC

## 2015-09-08 MED ORDER — METOPROLOL TARTRATE 25 MG PO TABS: 25.0000 mg | ORAL_TABLET | Freq: Two times a day (BID) | ORAL | Status: DC | PRN

## 2015-09-08 NOTE — Progress Notes (Signed)
Subjective:    Patient ID: David Kim, male    DOB: [birth year], 69 y.o.   MRN: [id number]  HPI  Here to f/u; overall doing ok,  Pt denies chest pain, increasing sob or doe, wheezing, orthopnea, PND, increased LE swelling, palpitations, dizziness or syncope.  Pt denies new neurological symptoms such as new headache, or facial or extremity weakness or numbness.  Pt denies polydipsia, polyuria, or low sugar episode.   Pt denies new neurological symptoms such as new headache, or facial or extremity weakness or numbness.   Pt states overall good compliance with meds, mostly trying to follow appropriate diet, with wt overall stable,  but little exercise however.  Using standing machine at home daily and BP increased to > 180 sbp with effort BP Readings from Last 3 Encounters:  09/08/15 140/80  09/07/15 142/80  08/16/15 112/70   Past Medical History  Diagnosis Date  . Low back pain   . Gout   . IBS (irritable bowel syndrome)   . Peptic ulcer disease   . Allergic rhinitis   . Depression   . Depression   . Anxiety   . Erectile dysfunction   . OSA (obstructive sleep apnea)   . GERD (gastroesophageal reflux disease)   . Prostate cancer ([place])   . Hypertension   . Carpal tunnel syndrome     right  . Fatigue   . Paraplegia ([place]) 04/23/2009    Qualifier: Diagnosis of  By: Jenny Reichmann MD, Hunt Oris   . PROSTATE CANCER, HX OF 05/23/2007    Qualifier: Diagnosis of  By: Jenny Reichmann MD, [place] PARALYSIS 04/23/2009    Qualifier: Diagnosis of  By: Jenny Reichmann MD, Hunt Oris   . Thoracic spinal cord injury ([place]) 03/12/2012  . ALLERGIC RHINITIS 10/01/2007    Qualifier: Diagnosis of  By: Jenny Reichmann MD, Hunt Oris   . HYPOGONADISM 03/31/2008    Qualifier: Diagnosis of  By: Jenny Reichmann MD, Hunt Oris   . PEPTIC ULCER DISEASE 10/01/2007    Qualifier: Diagnosis of  By: Jenny Reichmann MD, Hunt Oris    Past Surgical History  Procedure Laterality Date  . Prostatectomy    . Inguinal heniorrhaphy    . Tonsillectomy    . Pilonidal cyst / sinus  excision    . Left leg surgery after fibula      reports that he has never smoked. He has never used smokeless tobacco. He reports that he does not drink alcohol or use illicit drugs. family history includes Dementia in his mother; Diabetes in his father; High blood pressure in his mother. Allergies  Allergen Reactions  . Amoxicillin   . Endal Hd    Current Outpatient Prescriptions on File Prior to Visit  Medication Sig Dispense Refill  . aspirin (ECOTRIN LOW STRENGTH) 81 MG EC tablet Take 81 mg by mouth daily.      . baclofen (LIORESAL) 10 MG tablet Take 1.5 tablets (15 mg total) by mouth 4 (four) times daily. (Patient taking differently: Take 10 mg by mouth 4 (four) times daily. ) 540 tablet 3  . clonazePAM (KLONOPIN) 2 MG tablet Take 1-1.5 tablets (2-3 mg total) by mouth at bedtime. FOR SPASTICITY 135 tablet 1  . oxybutynin (DITROPAN) 5 MG tablet Take 5 mg by mouth 4 (four) times daily.     Marland Kitchen tiZANidine (ZANAFLEX) 4 MG tablet Take 2 tablets (8 mg total) by mouth 4 (four) times daily as needed. 270 tablet 12   No current facility-administered medications  on file prior to visit.   Review of Systems  Constitutional: Negative for unusual diaphoresis or night sweats HENT: Negative for ringing in ear or discharge Eyes: Negative for double vision or worsening visual disturbance.  Respiratory: Negative for choking and stridor.   Gastrointestinal: Negative for vomiting or other signifcant bowel change Genitourinary: Negative for hematuria or change in urine volume.  Musculoskeletal: Negative for other MSK pain or swelling Skin: Negative for color change and worsening wound.  Neurological: Negative for tremors and numbness other than noted  Psychiatric/Behavioral: Negative for decreased concentration or agitation other than above       Objective:   Physical Exam BP 140/80 mmHg  Pulse 78  Temp(Src) 98.4 F (36.9 C) (Oral)  SpO2 98% VS noted,  Constitutional: Pt appears in no  significant distress HENT: Head: NCAT.  Right Ear: External ear normal.  Left Ear: External ear normal.  Eyes: . Pupils are equal, round, and reactive to light. Conjunctivae and EOM are normal Neck: Normal range of motion. Neck supple.  Cardiovascular: Normal rate and regular rhythm.   Pulmonary/Chest: Effort normal and breath sounds without rales or wheezing.  Abd:  Soft, NT, ND, + BS Neurological: Pt is alert. Not confused , in wheelchair/paraplegic Skin: Skin is warm. No rash, no LE edema Psychiatric: Pt behavior is normal. No agitation.     Assessment & Plan:

## 2015-09-08 NOTE — Patient Instructions (Signed)
OK to increase the losartan to 100 mg per day  OK to take the metoprolol 25 mg twice per day only if needed for BP more than 140/90  Please continue all other medications as before, and refills have been done if requested.  Please have the pharmacy call with any other refills you may need.  Please continue your efforts at being more active, low cholesterol diet, and weight control.  Please keep your appointments with your specialists as you may have planned

## 2015-09-08 NOTE — Telephone Encounter (Signed)
Noted, will see at OV.  

## 2015-09-08 NOTE — Progress Notes (Signed)
Pre visit review using our clinic review tool, if applicable. No additional management support is needed unless otherwise documented below in the visit note. 

## 2015-09-11 NOTE — Assessment & Plan Note (Signed)
Somewhat labile by hx, for increased losartan 100 qd, also metoprolol 25 bid prn sbp > 140

## 2015-10-04 ENCOUNTER — Ambulatory Visit: Payer: Medicare Other | Admitting: Internal Medicine

## 2015-10-04 ENCOUNTER — Encounter: Payer: Self-pay | Admitting: Internal Medicine

## 2015-10-04 VITALS — BP 116/68 | HR 74

## 2015-10-04 DIAGNOSIS — G4733 Obstructive sleep apnea (adult) (pediatric): Secondary | ICD-10-CM

## 2015-10-04 NOTE — Progress Notes (Signed)
   Subjective:    Patient ID: David Kim, male    DOB: 1946/04/02, 69 y.o.   MRN: [id number]  HPI  08/05/2014-Dr. Gwenette Greet The patient comes in today for followup of his obstructive sleep apnea. He has gotten a new machine from the last visit, and is doing very well with his device. He is wearing CPAP compliantly by his download, and has excellent control of his AHI and no significant mask leaks. He feels that he is sleeping well with the device, although I have encouraged him to try to get a little more sleep.  10/04/2015-70 year old male followed for OSA/Insomnia complicated by paraplegia, GERD NPSG 2003, AHI 31 per hour auto FOLLOWS FOR: [facility] pt. Wears nightly x 5-6hrs. Uses with naps at times. Denies problems with pressure setting. Needs new suplies- having mask leakage.     Review of Systems  Constitutional: Negative for fever and unexpected weight change.  HENT: Negative for congestion, dental problem, ear pain, nosebleeds, postnasal drip, rhinorrhea, sinus pressure, sneezing, sore throat and trouble swallowing.   Eyes: Negative for redness and itching.  Respiratory: Negative for cough, chest tightness, shortness of breath and wheezing.   Cardiovascular: Negative for palpitations and leg swelling.  Gastrointestinal: Negative for nausea and vomiting.  Genitourinary: Negative for dysuria.  Musculoskeletal: Negative for joint swelling.  Skin: Negative for rash.  Neurological: Negative for headaches.  Hematological: Does not bruise/bleed easily.  Psychiatric/Behavioral: Negative for dysphoric mood. The patient is not nervous/anxious.        Objective:   Physical Exam Overweight male in no acute distress Nose without purulence or discharge noted No skin breakdown or pressure necrosis from the CPAP mask Neck without lymphadenopathy or thyromegaly Lower extremities with mild edema, no cyanosis Alert and oriented, moves all 4 extremities.       Assessment & Plan:

## 2015-10-04 NOTE — Patient Instructions (Signed)
Order- continue CPAP care, 10, mask of choice, humidifier, supplies, AirView      Dx OSA  Please call if we can help

## 2015-10-15 ENCOUNTER — Other Ambulatory Visit: Payer: Self-pay | Admitting: Neurology

## 2015-10-19 DIAGNOSIS — D1801 Hemangioma of skin and subcutaneous tissue: Secondary | ICD-10-CM | POA: Diagnosis not present

## 2015-10-19 DIAGNOSIS — Z85828 Personal history of other malignant neoplasm of skin: Secondary | ICD-10-CM | POA: Diagnosis not present

## 2015-10-19 DIAGNOSIS — L821 Other seborrheic keratosis: Secondary | ICD-10-CM | POA: Diagnosis not present

## 2015-10-19 DIAGNOSIS — L570 Actinic keratosis: Secondary | ICD-10-CM | POA: Diagnosis not present

## 2015-10-22 ENCOUNTER — Ambulatory Visit: Payer: Medicare Other | Admitting: Internal Medicine

## 2015-10-26 ENCOUNTER — Ambulatory Visit (INDEPENDENT_AMBULATORY_CARE_PROVIDER_SITE_OTHER): Payer: Medicare Other | Admitting: Internal Medicine

## 2015-10-26 ENCOUNTER — Encounter: Payer: Self-pay | Admitting: Internal Medicine

## 2015-10-26 VITALS — BP 116/70 | HR 80 | Temp 97.9°F

## 2015-10-26 DIAGNOSIS — K219 Gastro-esophageal reflux disease without esophagitis: Secondary | ICD-10-CM

## 2015-10-26 DIAGNOSIS — I10 Essential (primary) hypertension: Secondary | ICD-10-CM

## 2015-10-26 DIAGNOSIS — F32A Depression, unspecified: Secondary | ICD-10-CM

## 2015-10-26 DIAGNOSIS — F329 Major depressive disorder, single episode, unspecified: Secondary | ICD-10-CM

## 2015-10-26 NOTE — Patient Instructions (Signed)
Please continue all other medications as before, and refills have been done if requested.  Please have the pharmacy call with any other refills you may need.  Please continue your efforts at being more active, low cholesterol diet, and weight control.  Please keep your appointments with your specialists as you may have planned  Please return in 6 months, or sooner if needed 

## 2015-10-26 NOTE — Progress Notes (Signed)
Subjective:    Patient ID: David Kim, male    DOB: [birth year], 69 y.o.   MRN: [id number]  HPI  Here to f/u; overall doing ok,  Pt denies chest pain, increasing sob or doe, wheezing, orthopnea, PND, increased LE swelling, palpitations, dizziness or syncope.  Pt denies new neurological symptoms such as new headache, or facial or extremity weakness or numbness.  Pt denies polydipsia, polyuria, or low sugar episode.   Pt denies new neurological symptoms such as new headache, or facial or extremity weakness or numbness.   Pt states overall good compliance with meds, mostly trying to follow appropriate diet, with wt overall stable,  but little exercise however. Trying to stay active in his wheelchair as possible.  Last PSA 0.09 per pt.  Last UTi 2011 woith self cathing at that time, no longer doing hit.  Good compliacne with COPP, denies fatigue or somnolence  Denies worsening depressive symptoms, suicidal ideation, or panic;  Gets PSA regularly with urology Past Medical History  Diagnosis Date  . Low back pain   . Gout   . IBS (irritable bowel syndrome)   . Peptic ulcer disease   . Allergic rhinitis   . Depression   . Depression   . Anxiety   . Erectile dysfunction   . OSA (obstructive sleep apnea)   . GERD (gastroesophageal reflux disease)   . Prostate cancer ([place])   . Hypertension   . Carpal tunnel syndrome     right  . Fatigue   . Paraplegia ([place]) 04/23/2009    Qualifier: Diagnosis of  By: Jenny Reichmann MD, Hunt Oris   . PROSTATE CANCER, HX OF 05/23/2007    Qualifier: Diagnosis of  By: Jenny Reichmann MD, [place] PARALYSIS 04/23/2009    Qualifier: Diagnosis of  By: Jenny Reichmann MD, Hunt Oris   . Thoracic spinal cord injury ([place]) 03/12/2012  . ALLERGIC RHINITIS 10/01/2007    Qualifier: Diagnosis of  By: Jenny Reichmann MD, Hunt Oris   . HYPOGONADISM 03/31/2008    Qualifier: Diagnosis of  By: Jenny Reichmann MD, Hunt Oris   . PEPTIC ULCER DISEASE 10/01/2007    Qualifier: Diagnosis of  By: Jenny Reichmann MD, Hunt Oris    Past Surgical  History  Procedure Laterality Date  . Prostatectomy    . Inguinal heniorrhaphy    . Tonsillectomy    . Pilonidal cyst / sinus excision    . Left leg surgery after fibula      reports that he has never smoked. He has never used smokeless tobacco. He reports that he does not drink alcohol or use illicit drugs. family history includes Dementia in his mother; Diabetes in his father; High blood pressure in his mother. Allergies  Allergen Reactions  . Amoxicillin   . Endal Hd    Current Outpatient Prescriptions on File Prior to Visit  Medication Sig Dispense Refill  . aspirin (ECOTRIN LOW STRENGTH) 81 MG EC tablet Take 81 mg by mouth daily.      . baclofen (LIORESAL) 10 MG tablet Take 10 mg by mouth 3 (three) times daily.    . baclofen (LIORESAL) 20 MG tablet Take 20 mg by mouth 4 (four) times daily.    Marland Kitchen CALCIUM-MAGNESIUM PO Take by mouth. Mezotrace Powder Supplement  -- 1/2 tsp qd    . clonazePAM (KLONOPIN) 2 MG tablet Take 1-1.5 tablets (2-3 mg total) by mouth at bedtime. FOR SPASTICITY 135 tablet 1  . losartan (COZAAR) 100 MG tablet Take 1 tablet (100  mg total) by mouth daily. 90 tablet 3  . metoprolol tartrate (LOPRESSOR) 25 MG tablet Take 1 tablet (25 mg total) by mouth 2 (two) times daily as needed (prn BP more than 140/90). 180 tablet 3  . oxybutynin (DITROPAN) 5 MG tablet Take 5 mg by mouth 4 (four) times daily.     Marland Kitchen tiZANidine (ZANAFLEX) 4 MG tablet take 2 tablets by mouth four times a day if needed 270 tablet 12  . vitamin C (ASCORBIC ACID) 500 MG tablet Take 500 mg by mouth daily.    . vitamin E (VITAMIN E) 400 UNIT capsule Take 800 Units by mouth daily.     No current facility-administered medications on file prior to visit.   Review of Systems  Constitutional: Negative for unusual diaphoresis or night sweats HENT: Negative for ringing in ear or discharge Eyes: Negative for double vision or worsening visual disturbance.  Respiratory: Negative for choking and stridor.     Gastrointestinal: Negative for vomiting or other signifcant bowel change Genitourinary: Negative for hematuria or change in urine volume.  Musculoskeletal: Negative for other MSK pain or swelling Skin: Negative for color change and worsening wound.  Neurological: Negative for tremors and numbness other than noted  Psychiatric/Behavioral: Negative for decreased concentration or agitation other than above       Objective:   Physical Exam BP 116/70 mmHg  Pulse 80  Temp(Src) 97.9 F (36.6 C) (Oral)  SpO2 97% VS noted,  Constitutional: Pt appears in no significant distress HENT: Head: NCAT.  Right Ear: External ear normal.  Left Ear: External ear normal.  Eyes: . Pupils are equal, round, and reactive to light. Conjunctivae and EOM are normal Neck: Normal range of motion. Neck supple.  Cardiovascular: Normal rate and regular rhythm.   Pulmonary/Chest: Effort normal and breath sounds without rales or wheezing.  Abd:  Soft, NT, ND, + BS Neurological: Pt is alert. Not confused , motor grossly intact except paraplegic no change Skin: Skin is warm. No rash, no LE edema Psychiatric: Pt behavior is normal. No agitation. not depressed affect    Assessment & Plan:

## 2015-10-26 NOTE — Progress Notes (Signed)
Pre visit review using our clinic review tool, if applicable. No additional management support is needed unless otherwise documented below in the visit note. 

## 2015-10-26 NOTE — Assessment & Plan Note (Signed)
Improved, now stalble,  to f/u any worsening symptoms or concerns  BP Readings from Last 3 Encounters:  10/26/15 116/70  10/04/15 116/68  09/08/15 140/80

## 2015-11-01 NOTE — Assessment & Plan Note (Signed)
stable overall by history and exam, and pt to continue medical treatment as before,  to f/u any worsening symptoms or concerns 

## 2015-11-01 NOTE — Assessment & Plan Note (Signed)
stable overall by history and exam, recent data reviewed with pt, and pt to continue medical treatment as before,  to f/u any worsening symptoms or concerns Lab Results  Component Value Date   WBC 7.9 04/22/2015   HGB 15.3 04/22/2015   HCT 46.6 04/22/2015   PLT 217.0 04/22/2015   GLUCOSE 92 04/22/2015   CHOL 121 04/22/2015   TRIG 190.0* 04/22/2015   HDL 30.90* 04/22/2015   LDLDIRECT 88.3 10/20/2009   LDLCALC 52 04/22/2015   ALT 19 04/22/2015   AST 18 04/22/2015   NA 137 04/22/2015   K 4.0 04/22/2015   CL 105 04/22/2015   CREATININE 0.83 04/22/2015   BUN 12 04/22/2015   CO2 28 04/22/2015   TSH 1.57 04/22/2015   PSA 0.04* 04/04/2013

## 2015-11-16 ENCOUNTER — Telehealth: Payer: Self-pay | Admitting: Neurology

## 2015-11-16 NOTE — Telephone Encounter (Signed)
I called and spoke to pharmacist, Jacquelynn Cree at the [facility].  She just wanted to confirm about the pts medication (that he takes baclofen and tizanidine) concerned about sedation on the pt.  I relayed that  both Dr Krista Blue and C.Evlyn Courier /NP are with the same practice and have seen pt yr apart and aware of baclofen taking 30mg  po qid and tizanidine 8 mg po qid prn.

## 2015-11-16 NOTE — Telephone Encounter (Signed)
[facility] pharm called and needs clarification on medication. Please call [phone].

## 2016-02-06 ENCOUNTER — Other Ambulatory Visit: Payer: Self-pay

## 2016-02-06 DIAGNOSIS — G839 Paralytic syndrome, unspecified: Secondary | ICD-10-CM

## 2016-02-06 DIAGNOSIS — S24109S Unspecified injury at unspecified level of thoracic spinal cord, sequela: Secondary | ICD-10-CM

## 2016-02-06 MED ORDER — CLONAZEPAM 2 MG PO TABS: 2.0000 mg | ORAL_TABLET | Freq: Every day | ORAL | Status: DC

## 2016-02-09 DIAGNOSIS — Z8546 Personal history of malignant neoplasm of prostate: Secondary | ICD-10-CM | POA: Diagnosis not present

## 2016-02-16 DIAGNOSIS — Z8546 Personal history of malignant neoplasm of prostate: Secondary | ICD-10-CM | POA: Diagnosis not present

## 2016-02-16 DIAGNOSIS — N200 Calculus of kidney: Secondary | ICD-10-CM | POA: Diagnosis not present

## 2016-02-16 DIAGNOSIS — N281 Cyst of kidney, acquired: Secondary | ICD-10-CM | POA: Diagnosis not present

## 2016-02-16 DIAGNOSIS — N312 Flaccid neuropathic bladder, not elsewhere classified: Secondary | ICD-10-CM | POA: Diagnosis not present

## 2016-02-16 DIAGNOSIS — Z0000 Encounter for general adult medical examination without abnormal findings: Secondary | ICD-10-CM | POA: Diagnosis not present

## 2016-03-13 ENCOUNTER — Encounter (HOSPITAL_COMMUNITY): Payer: Self-pay | Admitting: Nurse Practitioner

## 2016-03-13 ENCOUNTER — Ambulatory Visit (HOSPITAL_COMMUNITY)
Admission: EM | Admit: 2016-03-13 | Discharge: 2016-03-13 | Disposition: A | Discharge status: Home / Self Care | Payer: Medicare Other | Attending: Emergency Medicine | Admitting: Emergency Medicine

## 2016-03-13 DIAGNOSIS — R238 Other skin changes: Secondary | ICD-10-CM

## 2016-03-13 DIAGNOSIS — T148 Other injury of unspecified body region: Secondary | ICD-10-CM | POA: Diagnosis not present

## 2016-03-13 DIAGNOSIS — W57XXXA Bitten or stung by nonvenomous insect and other nonvenomous arthropods, initial encounter: Secondary | ICD-10-CM

## 2016-03-13 NOTE — ED Notes (Addendum)
Pt noticed a blistered sore to L inner knee area onset after waking this morning. He is concerned for an insect bite. He is paraplegic and can not feel any pain from waist down

## 2016-03-13 NOTE — ED Provider Notes (Signed)
CSN: [id number]     Arrival date & time 03/13/16  1344 History   First MD Initiated Contact with Patient 03/13/16 1435     Chief Complaint  Patient presents with  . Skin Problem   (Consider location/radiation/quality/duration/timing/severity/associated sxs/prior Treatment) HPI He is a 70 year old man here for evaluation of left knee blister. He is a paraplegic from spinal cord injury. He does not have any sensation in his lower extremities. He states this morning, he noticed ring of red on the medial left knee. There was a central papule with what he thought were 2 small fang marks. His wife cleaned it with alcohol and he quickly went on to develop a large blister in the center of the ring. There is no spreading redness. No drainage. He states they do have brown recluse spiders and black widows around their house. No fevers. No nausea or vomiting.  Past Medical History  Diagnosis Date  . Low back pain   . Gout   . IBS (irritable bowel syndrome)   . Peptic ulcer disease   . Allergic rhinitis   . Depression   . Depression   . Anxiety   . Erectile dysfunction   . OSA (obstructive sleep apnea)   . GERD (gastroesophageal reflux disease)   . Prostate cancer ([place])   . Hypertension   . Carpal tunnel syndrome     right  . Fatigue   . Paraplegia ([place]) 04/23/2009    Qualifier: Diagnosis of  By: Jenny Reichmann MD, Hunt Oris   . PROSTATE CANCER, HX OF 05/23/2007    Qualifier: Diagnosis of  By: Jenny Reichmann MD, [place] PARALYSIS 04/23/2009    Qualifier: Diagnosis of  By: Jenny Reichmann MD, Hunt Oris   . Thoracic spinal cord injury ([place]) 03/12/2012  . ALLERGIC RHINITIS 10/01/2007    Qualifier: Diagnosis of  By: Jenny Reichmann MD, Hunt Oris   . HYPOGONADISM 03/31/2008    Qualifier: Diagnosis of  By: Jenny Reichmann MD, Hunt Oris   . PEPTIC ULCER DISEASE 10/01/2007    Qualifier: Diagnosis of  By: Jenny Reichmann MD, Hunt Oris    Past Surgical History  Procedure Laterality Date  . Prostatectomy    . Inguinal heniorrhaphy    . Tonsillectomy    .  Pilonidal cyst / sinus excision    . Left leg surgery after fibula     Family History  Problem Relation Age of Onset  . Diabetes Father   . High blood pressure Mother   . Dementia Mother    Social History  Substance Use Topics  . Smoking status: Never Smoker   . Smokeless tobacco: Never Used  . Alcohol Use: No    Review of Systems As in history of present illness Allergies  Amoxicillin and Endal hd  Home Medications   Prior to Admission medications   Medication Sig Start Date End Date Taking? Authorizing Provider  aspirin (ECOTRIN LOW STRENGTH) 81 MG EC tablet Take 81 mg by mouth daily.      Historical Provider, MD  baclofen (LIORESAL) 10 MG tablet Take 10 mg by mouth 3 (three) times daily.    Historical Provider, MD  baclofen (LIORESAL) 20 MG tablet Take 20 mg by mouth 4 (four) times daily.    Historical Provider, MD  CALCIUM-MAGNESIUM PO Take by mouth. Mezotrace Powder Supplement  -- 1/2 tsp qd    Historical Provider, MD  clonazePAM (KLONOPIN) 2 MG tablet Take 1-1.5 tablets (2-3 mg total) by mouth at bedtime. FOR SPASTICITY 02/06/16  Meredith Staggers, MD  losartan (COZAAR) 100 MG tablet Take 1 tablet (100 mg total) by mouth daily. 09/08/15   Biagio Borg, MD  metoprolol tartrate (LOPRESSOR) 25 MG tablet Take 1 tablet (25 mg total) by mouth 2 (two) times daily as needed (prn BP more than 140/90). 09/08/15   Biagio Borg, MD  oxybutynin (DITROPAN) 5 MG tablet Take 5 mg by mouth 4 (four) times daily.     Historical Provider, MD  tiZANidine (ZANAFLEX) 4 MG tablet take 2 tablets by mouth four times a day if needed 10/18/15   Marcial Pacas, MD  vitamin C (ASCORBIC ACID) 500 MG tablet Take 500 mg by mouth daily.    Historical Provider, MD  vitamin E (VITAMIN E) 400 UNIT capsule Take 800 Units by mouth daily.    Historical Provider, MD   Meds Ordered and Administered this Visit  Medications - No data to display  BP 158/74 mmHg  Pulse 70  Temp(Src) 98.4 F (36.9 C) (Oral)  Resp 16   SpO2 99% No data found.   Physical Exam  Constitutional: He is oriented to person, place, and time. He appears well-developed and well-nourished. No distress.  Cardiovascular: Normal rate.   Pulmonary/Chest: Effort normal.  Neurological: He is alert and oriented to person, place, and time.  Skin:  Left medial knee: There is a 3.5 x 3.5 cm ring of erythema on the medial knee. Within this ring there is a large blister. Patient had marked the ring of erythema this morning, and it is the same this afternoon. No central necrosis. No erythema or induration to suggest cellulitis.    ED Course  Procedures (including critical care time)  Labs Review Labs Reviewed - No data to display  Imaging Review No results found.   MDM   1. Insect bite   2. Nontraumatic blister of skin    This could actually be a spider bite. Recommended wound care as in after visit summary. Patient does use colloidal silver as an antibacterial. Recommended follow-up with PCP in the next week. Warning signs reviewed.    Melony Overly, MD 03/13/16 1540

## 2016-03-13 NOTE — Discharge Instructions (Signed)
This may actually be a spider bite. Wash the area gently with soap and water twice a day. You can apply the silver or Neosporin. Try to keep the blister intact as this will help prevent infection. If the blister ruptures, continue to wash it with soap and water twice a day and apply antibacterial ointment. Follow-up with your primary care doctor in 1 week for a recheck. If you develop fevers, chills, nausea, vomiting, or you see the center of the blister turning purple or black, please come back or go to the emergency room.

## 2016-03-28 ENCOUNTER — Ambulatory Visit (INDEPENDENT_AMBULATORY_CARE_PROVIDER_SITE_OTHER): Payer: Medicare Other | Admitting: Internal Medicine

## 2016-03-28 ENCOUNTER — Encounter: Payer: Self-pay | Admitting: Internal Medicine

## 2016-03-28 ENCOUNTER — Other Ambulatory Visit (INDEPENDENT_AMBULATORY_CARE_PROVIDER_SITE_OTHER): Payer: Medicare Other

## 2016-03-28 VITALS — BP 138/76 | HR 68 | Temp 98.9°F | Resp 20 | Wt 190.0 lb

## 2016-03-28 DIAGNOSIS — I10 Essential (primary) hypertension: Secondary | ICD-10-CM

## 2016-03-28 DIAGNOSIS — F32A Depression, unspecified: Secondary | ICD-10-CM

## 2016-03-28 DIAGNOSIS — L98499 Non-pressure chronic ulcer of skin of other sites with unspecified severity: Secondary | ICD-10-CM

## 2016-03-28 DIAGNOSIS — K219 Gastro-esophageal reflux disease without esophagitis: Secondary | ICD-10-CM | POA: Diagnosis not present

## 2016-03-28 DIAGNOSIS — F329 Major depressive disorder, single episode, unspecified: Secondary | ICD-10-CM

## 2016-03-28 LAB — CBC WITH DIFFERENTIAL/PLATELET
BASOS ABS: 0 10*3/uL (ref 0.0–0.1)
Basophils Relative: 0.4 % (ref 0.0–3.0)
EOS PCT: 5.2 % — AB (ref 0.0–5.0)
Eosinophils Absolute: 0.4 10*3/uL (ref 0.0–0.7)
HCT: 48 % (ref 39.0–52.0)
Hemoglobin: 16 g/dL (ref 13.0–17.0)
LYMPHS ABS: 1.9 10*3/uL (ref 0.7–4.0)
Lymphocytes Relative: 23.5 % (ref 12.0–46.0)
MCHC: 33.4 g/dL (ref 30.0–36.0)
MCV: 88 fl (ref 78.0–100.0)
MONO ABS: 0.5 10*3/uL (ref 0.1–1.0)
Monocytes Relative: 6.8 % (ref 3.0–12.0)
Neutro Abs: 5.1 10*3/uL (ref 1.4–7.7)
Neutrophils Relative %: 64.1 % (ref 43.0–77.0)
Platelets: 223 10*3/uL (ref 150.0–400.0)
RBC: 5.46 Mil/uL (ref 4.22–5.81)
RDW: 14.6 % (ref 11.5–15.5)
WBC: 7.9 10*3/uL (ref 4.0–10.5)

## 2016-03-28 LAB — HEPATIC FUNCTION PANEL
ALT: 23 U/L (ref 0–53)
AST: 22 U/L (ref 0–37)
Albumin: 4.5 g/dL (ref 3.5–5.2)
Alkaline Phosphatase: 56 U/L (ref 39–117)
BILIRUBIN TOTAL: 0.9 mg/dL (ref 0.2–1.2)
Bilirubin, Direct: 0.2 mg/dL (ref 0.0–0.3)
Total Protein: 6.8 g/dL (ref 6.0–8.3)

## 2016-03-28 LAB — BASIC METABOLIC PANEL
BUN: 14 mg/dL (ref 6–23)
CHLORIDE: 105 meq/L (ref 96–112)
CO2: 29 mEq/L (ref 19–32)
Calcium: 9.7 mg/dL (ref 8.4–10.5)
Creatinine, Ser: 0.78 mg/dL (ref 0.40–1.50)
GFR: 105.16 mL/min (ref >60.00)
GLUCOSE: 83 mg/dL (ref 70–99)
Potassium: 3.9 mEq/L (ref 3.5–5.1)
Sodium: 139 mEq/L (ref 135–145)

## 2016-03-28 LAB — LIPID PANEL
CHOL/HDL RATIO: 5
Cholesterol: 136 mg/dL (ref 0–200)
HDL: 28.9 mg/dL — AB (ref >39.00)
LDL Cholesterol: 80 mg/dL (ref 0–99)
NonHDL: 107.26
TRIGLYCERIDES: 138 mg/dL (ref 0.0–149.0)
VLDL: 27.6 mg/dL (ref 0.0–40.0)

## 2016-03-28 LAB — TSH: TSH: 1.81 u[IU]/mL (ref 0.35–4.50)

## 2016-03-28 NOTE — Progress Notes (Signed)
Subjective:    Patient ID: David Kim, male    DOB: August 05, 1946, 70 y.o.   MRN: [id number]  HPI  Here to f/u; overall doing ok,  Pt denies chest pain, increasing sob or doe, wheezing, orthopnea, PND, increased LE swelling, palpitations, dizziness or syncope.  Pt denies new neurological symptoms such as new headache, or facial or extremity weakness or numbness.  Pt denies polydipsia, polyuria, or low sugar episode.   Pt denies new neurological symptoms such as new headache, or facial or extremity weakness or numbness.   Pt states overall good compliance with meds, mostly trying to follow appropriate diet, with wt overall stable,  but little exercise however. Also Has 2 wks onset left medial knee ulceration, seemed to start as insect bite but now superficial wound is approx 2 cm diamter  Just had neg UA and PSA 0.8 per Dr Wrenn/urology recently, does not need repeated..  Denies worsening reflux, abd pain, dysphagia, n/v, bowel change or blood. Past Medical History  Diagnosis Date  . Low back pain   . Gout   . IBS (irritable bowel syndrome)   . Peptic ulcer disease   . Allergic rhinitis   . Depression   . Depression   . Anxiety   . Erectile dysfunction   . OSA (obstructive sleep apnea)   . GERD (gastroesophageal reflux disease)   . Prostate cancer ([place])   . Hypertension   . Carpal tunnel syndrome     right  . Fatigue   . Paraplegia ([place]) 04/23/2009    Qualifier: Diagnosis of  By: Jenny Reichmann MD, Hunt Oris   . PROSTATE CANCER, HX OF 05/23/2007    Qualifier: Diagnosis of  By: Jenny Reichmann MD, [place] PARALYSIS 04/23/2009    Qualifier: Diagnosis of  By: Jenny Reichmann MD, Hunt Oris   . Thoracic spinal cord injury ([place]) 03/12/2012  . ALLERGIC RHINITIS 10/01/2007    Qualifier: Diagnosis of  By: Jenny Reichmann MD, Hunt Oris   . HYPOGONADISM 03/31/2008    Qualifier: Diagnosis of  By: Jenny Reichmann MD, Hunt Oris   . PEPTIC ULCER DISEASE 10/01/2007    Qualifier: Diagnosis of  By: Jenny Reichmann MD, Hunt Oris    Past Surgical History    Procedure Laterality Date  . Prostatectomy    . Inguinal heniorrhaphy    . Tonsillectomy    . Pilonidal cyst / sinus excision    . Left leg surgery after fibula      reports that he has never smoked. He has never used smokeless tobacco. He reports that he does not drink alcohol or use illicit drugs. family history includes Dementia in his mother; Diabetes in his father; High blood pressure in his mother. Allergies  Allergen Reactions  . Amoxicillin   . Endal Hd    Current Outpatient Prescriptions on File Prior to Visit  Medication Sig Dispense Refill  . aspirin (ECOTRIN LOW STRENGTH) 81 MG EC tablet Take 81 mg by mouth daily.      . baclofen (LIORESAL) 10 MG tablet Take 10 mg by mouth 3 (three) times daily.    . baclofen (LIORESAL) 20 MG tablet Take 20 mg by mouth 4 (four) times daily.    Marland Kitchen CALCIUM-MAGNESIUM PO Take by mouth. Mezotrace Powder Supplement  -- 1/2 tsp qd    . clonazePAM (KLONOPIN) 2 MG tablet Take 1-1.5 tablets (2-3 mg total) by mouth at bedtime. FOR SPASTICITY 135 tablet 1  . losartan (COZAAR) 100 MG tablet Take 1 tablet (100  mg total) by mouth daily. 90 tablet 3  . metoprolol tartrate (LOPRESSOR) 25 MG tablet Take 1 tablet (25 mg total) by mouth 2 (two) times daily as needed (prn BP more than 140/90). 180 tablet 3  . oxybutynin (DITROPAN) 5 MG tablet Take 5 mg by mouth 4 (four) times daily.     Marland Kitchen tiZANidine (ZANAFLEX) 4 MG tablet take 2 tablets by mouth four times a day if needed 270 tablet 12  . vitamin C (ASCORBIC ACID) 500 MG tablet Take 500 mg by mouth daily.    . vitamin E (VITAMIN E) 400 UNIT capsule Take 800 Units by mouth daily.     No current facility-administered medications on file prior to visit.   Review of Systems  Constitutional: Negative for unusual diaphoresis or night sweats HENT: Negative for ear swelling or discharge Eyes: Negative for worsening visual haziness  Respiratory: Negative for choking and stridor.   Gastrointestinal: Negative for  distension or worsening eructation Genitourinary: Negative for retention or change in urine volume.  Musculoskeletal: Negative for other MSK pain or swelling Skin: Negative for color change and worsening wound Neurological: Negative for tremors and numbness other than noted  Psychiatric/Behavioral: Negative for decreased concentration or agitation other than above       Objective:   Physical Exam BP 138/76 mmHg  Pulse 68  Temp(Src) 98.9 F (37.2 C) (Oral)  Resp 20  Wt 190 lb (86.183 kg)  SpO2 97% VS noted,  Constitutional: Pt appears in no apparent distress HENT: Head: NCAT.  Right Ear: External ear normal.  Left Ear: External ear normal.  Eyes: . Pupils are equal, round, and reactive to light. Conjunctivae and EOM are normal Neck: Normal range of motion. Neck supple.  Cardiovascular: Normal rate and regular rhythm.   Pulmonary/Chest: Effort normal and breath sounds without rales or wheezing.  Abd:  Soft, NT, ND, + BS Neurological: Pt is alert. Not confused , motor grossly intact Skin: Skin is warm. No rash, no LE edema, has 2 cm epidermis only superficial ulceraion left distal medial thigh without s/s cellulitis Psychiatric: Pt behavior is normal. No agitation. not depressed affect    Assessment & Plan:

## 2016-03-28 NOTE — Patient Instructions (Addendum)
Please start neosporin and gauze cover with daily changes until healed  Please see dermatology for the skin ulceration as you mentioned with self referral  Please continue all other medications as before, and refills have been done if requested.  Please have the pharmacy call with any other refills you may need  Please keep your appointments with your specialists as you may have planned  Please go to the LAB in the Basement (turn left off the elevator) for the tests to be done today  You will be contacted by phone if any changes need to be made immediately.  Otherwise, you will receive a letter about your results with an explanation, but please check with MyChart first.  Please remember to sign up for MyChart if you have not done so, as this will be important to you in the future with finding out test results, communicating by private email, and scheduling acute appointments online when needed.  Please return in 6 months, or sooner if needed

## 2016-03-28 NOTE — Progress Notes (Signed)
Pre visit review using our clinic review tool, if applicable. No additional management support is needed unless otherwise documented below in the visit note. 

## 2016-03-31 DIAGNOSIS — L98491 Non-pressure chronic ulcer of skin of other sites limited to breakdown of skin: Secondary | ICD-10-CM | POA: Diagnosis not present

## 2016-03-31 DIAGNOSIS — Z85828 Personal history of other malignant neoplasm of skin: Secondary | ICD-10-CM | POA: Diagnosis not present

## 2016-04-03 NOTE — Assessment & Plan Note (Signed)
stable overall by history and exam, and pt to continue medical treatment as before,  to f/u any worsening symptoms or concerns 

## 2016-04-03 NOTE — Assessment & Plan Note (Signed)
stable overall by history and exam, recent data reviewed with pt, and pt to continue medical treatment as before,  to f/u any worsening symptoms or concerns Lab Results  Component Value Date   WBC 7.9 03/28/2016   HGB 16.0 03/28/2016   HCT 48.0 03/28/2016   PLT 223.0 03/28/2016   GLUCOSE 83 03/28/2016   CHOL 136 03/28/2016   TRIG 138.0 03/28/2016   HDL 28.90* 03/28/2016   LDLDIRECT 88.3 10/20/2009   LDLCALC 80 03/28/2016   ALT 23 03/28/2016   AST 22 03/28/2016   NA 139 03/28/2016   K 3.9 03/28/2016   CL 105 03/28/2016   CREATININE 0.78 03/28/2016   BUN 14 03/28/2016   CO2 29 03/28/2016   TSH 1.81 03/28/2016   PSA 0.04* 04/04/2013

## 2016-04-03 NOTE — Assessment & Plan Note (Signed)
stable overall by history and exam, recent data reviewed with pt, and pt to continue medical treatment as before,  to f/u any worsening symptoms or concerns BP Readings from Last 3 Encounters:  03/28/16 138/76  03/13/16 158/74  10/26/15 116/70

## 2016-04-03 NOTE — Assessment & Plan Note (Signed)
Persistent nonhealing, x 2 wks, declines antibx trial, , to refer derm,  to f/u any worsening symptoms or concerns

## 2016-04-12 ENCOUNTER — Telehealth: Payer: Self-pay | Admitting: Internal Medicine

## 2016-04-12 DIAGNOSIS — L97901 Non-pressure chronic ulcer of unspecified part of unspecified lower leg limited to breakdown of skin: Secondary | ICD-10-CM | POA: Diagnosis not present

## 2016-04-12 DIAGNOSIS — Z85828 Personal history of other malignant neoplasm of skin: Secondary | ICD-10-CM | POA: Diagnosis not present

## 2016-04-12 NOTE — Telephone Encounter (Signed)
Yes, this is ok with me 

## 2016-04-12 NOTE — Telephone Encounter (Signed)
Please advise in dr john's absence, thanks 

## 2016-04-12 NOTE — Telephone Encounter (Signed)
Patient states that he needs a script for a new hospital bed because the one he has is worn out.  States it will not go up and down.  Please follow up with Patient in regards.

## 2016-04-12 NOTE — Telephone Encounter (Signed)
Patient states he can not wait a week for Dr. Jenny Reichmann to get back.  [facility]

## 2016-04-12 NOTE — Telephone Encounter (Signed)
Pt called to check up on this request and provide fax # to [facility] [phone].

## 2016-04-13 ENCOUNTER — Telehealth: Payer: Self-pay

## 2016-04-13 DIAGNOSIS — G8220 Paraplegia, unspecified: Secondary | ICD-10-CM

## 2016-04-13 NOTE — Telephone Encounter (Signed)
Order printed, waiting for dr Ronnald Ramp to sign---will be faxing all other info with order to [facility]

## 2016-04-13 NOTE — Telephone Encounter (Signed)
All paperwork faxed----copies of faxed material on David Kim's desk

## 2016-04-13 NOTE — Telephone Encounter (Signed)
Order created in epic, ok per dr Ronnald Ramp for hospital bed, i have called [facility] and advised ---sent faxed info to [phone]

## 2016-04-24 ENCOUNTER — Telehealth: Payer: Self-pay | Admitting: Internal Medicine

## 2016-04-24 NOTE — Telephone Encounter (Signed)
We did not prescribe hospital bed. This order came from Dr Ronnald Ramp, so they should ask him for help with the bed rails.

## 2016-04-24 NOTE — Telephone Encounter (Signed)
Spoke with patient wife, aware of rec's per CY. States that they are going to contact PCP and if unable to get Rx through them they will try the Neurologist. Nothing further needed.

## 2016-04-24 NOTE — Telephone Encounter (Signed)
Pt called state they deliver the hospital bed but the rail was too long. Pt need new rx for short rail to be send into [facility]. Please call pt once its done.

## 2016-04-24 NOTE — Telephone Encounter (Signed)
Spoke with pt's wife. She states that they received the hospital bed we ordered on Friday. They are needing short bed rails for the bed. [facility] is the DME who delivered the bed.  CY - please advise. Thanks.

## 2016-04-26 NOTE — Telephone Encounter (Signed)
Please advise 

## 2016-04-27 NOTE — Telephone Encounter (Signed)
Hard copy faxed. 

## 2016-04-27 NOTE — Telephone Encounter (Signed)
Done hardcopy to Corinne  

## 2016-04-27 NOTE — Telephone Encounter (Signed)
Please advise 

## 2016-04-27 NOTE — Telephone Encounter (Signed)
Patient called again about this. Upset that it has not been done yet. I informed her you have been out of the office and you are getting to things as fast as you can. She always would like for you to call her once you have sent it over. Thank you.

## 2016-04-28 ENCOUNTER — Ambulatory Visit: Payer: Medicare Other | Admitting: Physical Medicine & Rehabilitation

## 2016-04-28 NOTE — Telephone Encounter (Signed)
Pt left msg on triage requesting to speak w/nurse concerning msg below concerning the rails for hospital bed. Pls call pt...David Kim

## 2016-04-28 NOTE — Telephone Encounter (Signed)
Spoke to patient regarding questions.  

## 2016-05-03 ENCOUNTER — Encounter: Payer: Medicare Other | Admitting: Physical Medicine & Rehabilitation

## 2016-05-04 DIAGNOSIS — L98491 Non-pressure chronic ulcer of skin of other sites limited to breakdown of skin: Secondary | ICD-10-CM | POA: Diagnosis not present

## 2016-05-04 DIAGNOSIS — Z85828 Personal history of other malignant neoplasm of skin: Secondary | ICD-10-CM | POA: Diagnosis not present

## 2016-06-05 ENCOUNTER — Encounter: Payer: Medicare Other | Attending: Physical Medicine & Rehabilitation | Admitting: Physical Medicine & Rehabilitation

## 2016-06-05 ENCOUNTER — Encounter: Payer: Self-pay | Admitting: Physical Medicine & Rehabilitation

## 2016-06-05 VITALS — BP 136/76 | HR 68

## 2016-06-05 DIAGNOSIS — C412 Malignant neoplasm of vertebral column: Secondary | ICD-10-CM | POA: Insufficient documentation

## 2016-06-05 DIAGNOSIS — R252 Cramp and spasm: Secondary | ICD-10-CM | POA: Diagnosis not present

## 2016-06-05 DIAGNOSIS — G8220 Paraplegia, unspecified: Secondary | ICD-10-CM | POA: Diagnosis not present

## 2016-06-05 DIAGNOSIS — G839 Paralytic syndrome, unspecified: Secondary | ICD-10-CM

## 2016-06-05 DIAGNOSIS — S24103A Unspecified injury at T7-T10 level of thoracic spinal cord, initial encounter: Secondary | ICD-10-CM | POA: Diagnosis not present

## 2016-06-05 DIAGNOSIS — I10 Essential (primary) hypertension: Secondary | ICD-10-CM | POA: Diagnosis not present

## 2016-06-05 DIAGNOSIS — S24109S Unspecified injury at unspecified level of thoracic spinal cord, sequela: Secondary | ICD-10-CM | POA: Diagnosis not present

## 2016-06-05 MED ORDER — CLONAZEPAM 2 MG PO TABS: 2.0000 mg | ORAL_TABLET | Freq: Every day | ORAL | 1 refills | Status: DC

## 2016-06-05 NOTE — Patient Instructions (Signed)
DISCUSS WITH DR Krista Blue ABOUT POTENTIALLY REDUCING ZANAFLEX.

## 2016-06-05 NOTE — Progress Notes (Signed)
Subjective:    Patient ID: David Kim, male    DOB: 10-May-1946, 70 y.o.   MRN: [id number]  HPI   David Kim is here in follow up of his sci and associated spastic paraplegia. His spasticity has been essentially the same. He feels that he's improving from a strength standpoint as he points to better control of his hips/pelvis. He is on 32mg  of tizanidine daily along with scheduled baclofen. He takes 2-3mg  of klonopin at night for clonus/spasms which he feels helps---he wants to know if he can take more.  He was bitten by a spider apparently about 3 months ago and the wound is still healing. Apparently it was a brown recluse or black widow per patient.  He complains of frequently being tired.    Pain Inventory Average Pain 0 Pain Right Now 0 My pain is no pain  In the last 24 hours, has pain interfered with the following? General activity 0 Relation with others 0 Enjoyment of life 0 What TIME of day is your pain at its worst? no pain Sleep (in general) Fair  Pain is worse with: no pain Pain improves with: no pain Relief from Meds: no pain  Mobility ability to climb steps?  no do you drive?  no use a wheelchair transfers alone Do you have any goals in this area?  yes  Function disabled: date disabled . retired I need assistance with the following:  bathing, toileting, meal prep, household duties and shopping Do you have any goals in this area?  yes  Neuro/Psych bladder control problems weakness numbness trouble walking spasms  Prior Studies Any changes since last visit?  no  Physicians involved in your care Any changes since last visit?  no   Family History  Problem Relation Age of Onset  . High blood pressure Mother   . Dementia Mother   . Diabetes Father    Social History   Social History  . Marital status: Married    Spouse name: [name]  . Number of children: 0  . Years of education: 12   Occupational History  . retired   .  Retired   Social  History Main Topics  . Smoking status: Never Smoker  . Smokeless tobacco: Never Used  . Alcohol use No  . Drug use: No  . Sexual activity: Not Asked   Other Topics Concern  . None   Social History Narrative   Patient lives at home with [name]). Patient is disabled. Patient has 12 th grade education.   Right handed.   Caffeine- None   Past Surgical History:  Procedure Laterality Date  . inguinal heniorrhaphy    . left leg surgery after fibula    . PILONIDAL CYST / SINUS EXCISION    . PROSTATECTOMY    . tonsillectomy     Past Medical History:  Diagnosis Date  . Allergic rhinitis   . ALLERGIC RHINITIS 10/01/2007   Qualifier: Diagnosis of  By: Jenny Reichmann MD, Hunt Oris   . Anxiety   . Carpal tunnel syndrome    right  . Depression   . Depression   . Erectile dysfunction   . Fatigue   . GERD (gastroesophageal reflux disease)   . Gout   . Hypertension   . HYPOGONADISM 03/31/2008   Qualifier: Diagnosis of  By: Jenny Reichmann MD, Hunt Oris   . IBS (irritable bowel syndrome)   . Low back pain   . OSA (obstructive sleep apnea)   . Paraplegia (  [place]) 04/23/2009   Qualifier: Diagnosis of  By: Jenny Reichmann MD, Hunt Oris   . Peptic ulcer disease   . PEPTIC ULCER DISEASE 10/01/2007   Qualifier: Diagnosis of  By: Jenny Reichmann MD, Hunt Oris   . Prostate cancer [facility])   . PROSTATE CANCER, HX OF 05/23/2007   Qualifier: Diagnosis of  By: Jenny Reichmann MD, [place] PARALYSIS 04/23/2009   Qualifier: Diagnosis of  By: Jenny Reichmann MD, Hunt Oris   . Thoracic spinal cord injury ([place]) 03/12/2012   BP 136/76   Pulse 68   SpO2 96%   Opioid Risk Score:   Fall Risk Score:  `1  Depression screen PHQ 2/9  Depression screen [facility] 2/9 04/22/2015 04/15/2014 10/14/2013  Decreased Interest 0 0 0  Down, Depressed, Hopeless 0 0 1  PHQ - 2 Score 0 0 1    Review of Systems  HENT: Negative.   Eyes: Negative.   Respiratory: Positive for apnea.   Cardiovascular: Positive for leg swelling.  Gastrointestinal: Positive for constipation and  diarrhea.  Endocrine: Negative.   Genitourinary: Positive for difficulty urinating.  Musculoskeletal: Positive for gait problem.       Spasms   Neurological: Positive for weakness and numbness.  All other systems reviewed and are negative.      Objective:   Physical Exam  Constitutional: He appears well-developed and well-nourished.  HENT:  Head: Normocephalic and atraumatic.  Eyes: Conjunctivae and EOM are normal. Pupils are equal, round, and reactive to light.  Neck: Normal range of motion. Neck supple.  Cardiovascular: Normal rate and regular rhythm.  Pulmonary/Chest: Effort normal and breath sounds normal.  Neurological:  No volitional movement in the legs except for trace hip control. No changes.  He has no sensation below the level of injury. He still has 0 resting tone in the legs. DTR's are hyperactive. He has sustained clonus in both ankles with ROM . UE strength is normal.   Sitting posture is good. UE strength is 5/5.  Skin:  Eschar over bite wound--coming loose  Psychiatric: He has a normal mood and affect. His behavior is normal. Judgment and thought content normal. tangential    Assessment & Plan:   ASSESSMENT:  1. 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  2. Hypertension.  3. Left thigh spider bite.    PLAN:  1. Continue with baclofen - can continue with tizanidine but he probably needs to decrease the dose.                      -could taper back to at least 16mg  and judge for effect---should discuss with Dr. Krista Blue.  2. Continue klonopin. can increase to 4mg  qhs for myoclonus/sleep.  3. Local care for bite wound 4. We will continue with yearly follow up.  5. 15 minutes of face to face patient care time were spent during this visit. All questions were encouraged and answered.

## 2016-06-13 DIAGNOSIS — Z85828 Personal history of other malignant neoplasm of skin: Secondary | ICD-10-CM | POA: Diagnosis not present

## 2016-06-13 DIAGNOSIS — L97901 Non-pressure chronic ulcer of unspecified part of unspecified lower leg limited to breakdown of skin: Secondary | ICD-10-CM | POA: Diagnosis not present

## 2016-07-25 ENCOUNTER — Encounter (HOSPITAL_COMMUNITY): Payer: Self-pay | Admitting: Emergency Medicine

## 2016-07-25 ENCOUNTER — Ambulatory Visit (INDEPENDENT_AMBULATORY_CARE_PROVIDER_SITE_OTHER): Payer: Medicare Other

## 2016-07-25 ENCOUNTER — Emergency Department (HOSPITAL_COMMUNITY)
Admission: EM | Admit: 2016-07-25 | Discharge: 2016-07-25 | Disposition: A | Discharge status: Home / Self Care | Payer: Medicare Other | Attending: Emergency Medicine | Admitting: Emergency Medicine

## 2016-07-25 ENCOUNTER — Ambulatory Visit (HOSPITAL_COMMUNITY)
Admission: EM | Admit: 2016-07-25 | Discharge: 2016-07-25 | Disposition: A | Discharge status: Nursing Home (Includes ICF) | Payer: Medicare Other | Attending: Internal Medicine | Admitting: Internal Medicine

## 2016-07-25 DIAGNOSIS — I10 Essential (primary) hypertension: Secondary | ICD-10-CM | POA: Diagnosis not present

## 2016-07-25 DIAGNOSIS — Y929 Unspecified place or not applicable: Secondary | ICD-10-CM | POA: Insufficient documentation

## 2016-07-25 DIAGNOSIS — Y999 Unspecified external cause status: Secondary | ICD-10-CM | POA: Diagnosis not present

## 2016-07-25 DIAGNOSIS — Z79899 Other long term (current) drug therapy: Secondary | ICD-10-CM | POA: Insufficient documentation

## 2016-07-25 DIAGNOSIS — W228XXA Striking against or struck by other objects, initial encounter: Secondary | ICD-10-CM | POA: Insufficient documentation

## 2016-07-25 DIAGNOSIS — Y939 Activity, unspecified: Secondary | ICD-10-CM | POA: Diagnosis not present

## 2016-07-25 DIAGNOSIS — Z7982 Long term (current) use of aspirin: Secondary | ICD-10-CM | POA: Insufficient documentation

## 2016-07-25 DIAGNOSIS — S72001A Fracture of unspecified part of neck of right femur, initial encounter for closed fracture: Secondary | ICD-10-CM

## 2016-07-25 DIAGNOSIS — S72141A Displaced intertrochanteric fracture of right femur, initial encounter for closed fracture: Secondary | ICD-10-CM | POA: Insufficient documentation

## 2016-07-25 DIAGNOSIS — Z8546 Personal history of malignant neoplasm of prostate: Secondary | ICD-10-CM | POA: Insufficient documentation

## 2016-07-25 DIAGNOSIS — S79911A Unspecified injury of right hip, initial encounter: Secondary | ICD-10-CM | POA: Diagnosis present
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
## 2016-07-25 NOTE — ED Triage Notes (Signed)
Pt to ER as transfer via [facility] from [facility] after presenting for evaluation of possible hip injury. Pt reports hearing a pop yesterday while bending over in his wheelchair to grab something, also reports getting caught between bed and wheelchair during transfer. Pt is paraplegic after spinal cord surgery "years" ago, does not feel any pain. VSS. A/o x4. Xrays from [facility] showing right intertrochanteric femur fracture.

## 2016-07-25 NOTE — ED Notes (Signed)
[facility] contacted and has a unit in en-route.

## 2016-07-25 NOTE — ED Triage Notes (Signed)
The patient presented to the [facility] with a complaint of right leg pain for 4 days secondary to him pinching it between his wheelchair and the transfer board.

## 2016-07-25 NOTE — Discharge Instructions (Signed)
You broke  right hip 5 days ago. Please follow-up with orthopedic surgery within 1 week. Phone numbers listed above. He may continue to transfer from bed to wheelchair as you normally do. Your orthopedic surgeon will talk to you about nonoperative management for your hip fracture and risk and benefits for elective repair if you choose to undergo that.
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
## 2016-07-25 NOTE — ED Notes (Signed)
Mali Engineer, structural at [facility] ED notified.

## 2016-07-26 DIAGNOSIS — S72144A Nondisplaced intertrochanteric fracture of right femur, initial encounter for closed fracture: Secondary | ICD-10-CM | POA: Diagnosis not present

## 2016-08-07 ENCOUNTER — Other Ambulatory Visit: Payer: Self-pay | Admitting: Internal Medicine

## 2016-08-07 ENCOUNTER — Other Ambulatory Visit: Payer: Self-pay | Admitting: Nurse Practitioner

## 2016-08-07 DIAGNOSIS — Z85828 Personal history of other malignant neoplasm of skin: Secondary | ICD-10-CM | POA: Diagnosis not present

## 2016-08-07 DIAGNOSIS — L98499 Non-pressure chronic ulcer of skin of other sites with unspecified severity: Secondary | ICD-10-CM | POA: Diagnosis not present

## 2016-08-08 NOTE — Telephone Encounter (Signed)
I called pt and he states he has enough of baclofen 20mg  tabs (taking one tablet qid) until he comes to see [place] on 09/04/2016.  Will hold on renewing this prescription.

## 2016-08-09 ENCOUNTER — Telehealth: Payer: Self-pay | Admitting: Internal Medicine

## 2016-08-09 ENCOUNTER — Telehealth: Payer: Self-pay | Admitting: Emergency Medicine

## 2016-08-09 ENCOUNTER — Telehealth: Payer: Self-pay | Admitting: Physical Medicine & Rehabilitation

## 2016-08-09 NOTE — Telephone Encounter (Signed)
Patient needs a letter for [employer], he is unable to do this.  He recently broke his hip on September 14.  Please call when this letter is ready for pick up.

## 2016-08-09 NOTE — Telephone Encounter (Signed)
Valentine for corinne to help with this, please

## 2016-08-09 NOTE — Telephone Encounter (Signed)
Patient states losartan/ potassium needs to be at 150mg .  Please follow up with patient in regard.

## 2016-08-09 NOTE — Telephone Encounter (Signed)
Pt called and wants to know if he can get an exemption letter for jury duty. It is scheduled for 10/03/16. Please advise thanks.

## 2016-08-09 NOTE — Telephone Encounter (Signed)
Patient suffered right intertrochanteric hip fracture that is closed. Dr. Rush Farmer was consulted, however, it seems that treatment is non-operative.  Patient was transferring from his wheelchair to commode.....Marland KitchenFYI

## 2016-08-09 NOTE — Telephone Encounter (Signed)
If his broken hip is the reason he can't serve he needs to contact the surgeon who treated his hip. Not sure why he's contacting us

## 2016-08-11 NOTE — Progress Notes (Signed)
Letter done

## 2016-08-16 ENCOUNTER — Ambulatory Visit: Payer: Self-pay | Admitting: Nurse Practitioner

## 2016-08-16 DIAGNOSIS — S72144D Nondisplaced intertrochanteric fracture of right femur, subsequent encounter for closed fracture with routine healing: Secondary | ICD-10-CM | POA: Diagnosis not present

## 2016-08-17 DIAGNOSIS — Z8546 Personal history of malignant neoplasm of prostate: Secondary | ICD-10-CM | POA: Diagnosis not present

## 2016-08-24 DIAGNOSIS — N3946 Mixed incontinence: Secondary | ICD-10-CM | POA: Diagnosis not present

## 2016-08-24 DIAGNOSIS — C61 Malignant neoplasm of prostate: Secondary | ICD-10-CM | POA: Diagnosis not present

## 2016-08-24 DIAGNOSIS — N312 Flaccid neuropathic bladder, not elsewhere classified: Secondary | ICD-10-CM | POA: Diagnosis not present

## 2016-09-04 ENCOUNTER — Ambulatory Visit (INDEPENDENT_AMBULATORY_CARE_PROVIDER_SITE_OTHER): Payer: Medicare Other | Admitting: Nurse Practitioner

## 2016-09-04 ENCOUNTER — Encounter: Payer: Self-pay | Admitting: Nurse Practitioner

## 2016-09-04 VITALS — BP 124/72 | HR 76 | Ht 68.0 in

## 2016-09-04 DIAGNOSIS — G8220 Paraplegia, unspecified: Secondary | ICD-10-CM | POA: Diagnosis not present

## 2016-09-04 DIAGNOSIS — R269 Unspecified abnormalities of gait and mobility: Secondary | ICD-10-CM | POA: Diagnosis not present

## 2016-09-04 DIAGNOSIS — S24109S Unspecified injury at unspecified level of thoracic spinal cord, sequela: Secondary | ICD-10-CM | POA: Diagnosis not present

## 2016-09-04 DIAGNOSIS — I10 Essential (primary) hypertension: Secondary | ICD-10-CM | POA: Diagnosis not present

## 2016-09-04 MED ORDER — TIZANIDINE HCL 4 MG PO TABS: ORAL_TABLET | ORAL | 11 refills | Status: DC

## 2016-09-04 MED ORDER — BACLOFEN 20 MG PO TABS: 20.0000 mg | ORAL_TABLET | Freq: Four times a day (QID) | ORAL | 11 refills | Status: DC

## 2016-09-04 MED ORDER — BACLOFEN 10 MG PO TABS: 10.0000 mg | ORAL_TABLET | Freq: Four times a day (QID) | ORAL | 11 refills | Status: DC

## 2016-09-04 NOTE — Patient Instructions (Addendum)
Continue Baclofen at current dose will refill Continue tizanidine at current dose Continue clonazepam at current dose refilled by Dr. Tessa Lerner F/U yearly and prn next with Krista Blue

## 2016-09-04 NOTE — Progress Notes (Signed)
[facility]  PATIENT: David Kim DOB: Mar 12, 1946   REASON FOR VISIT:  Follow-up for paraplegia , due to T 10 cord injury, abnormality of gait HISTORY FROM: patient and wife    HISTORY OF PRESENT ILLNESS: HISTORY:David Kim came in to refill his medications, he is accompanied by his wife, sitting wheelchair David Kim developed weakness in his legs in 2009. He slipped on ice in January 2009 and fractures ankles requiring pinning procedures. Following that time he developed progressive leg weakness. 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 It became progressively less safe for him to do that. He began to experience difficulty getting out of a car.  He had MRI imaging studies of his head, cervical, and lumbar spine. 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 Following the procedure, the patient became paraplegic, a hematoma was found in the operative site when he was reopened. He did not regain his ability to move his legs which he had been able to do preoperatively.He is a sensory level below the umbilicus. He has neurogenic bowel and bladder and wears diapers all the time. Occasional UTI. He was evaluated by Dr. Gaynell Face, deemed not a baclofen pump candidate due to nonambulatory, no significant pain. The patient has spasticity in his lower extremities and his torso, but he is not experiencing pain when he has spasms. The spasticity does not awaken him at night time. His tone has not increased to the point where it is difficult to dress him, perform hygiene, or to place him in his wheelchair. He self transfer himself in and out of wheelchair. He is on both baclofen 20+62m+ 567mqid and tizanidine 32m36m tabs q6hours, clonazepam 2mg38m2 qam, one qhs. He complains of dry mouth, also taking oxybutynin, but no excessive drowsiness, wife has been stretching his legs on  daily basis. He has no significant bilateral lower extremity movement, sensory level from umbilical down no significant pain.  UPDATE Oct 8th 2015:He has lower extremity spasm, which is overall manageable recurrent baclofen 20 mg, 1 and half tablets 4 times a day, tizanidine as needed, clonazepam 2 mg every night Today he also reported episode of seeing spider webs in his visual field, with associated blurry vision, mild confusion, lasting 10-15 minutes, there was no associated headaches, which raised the possibility of possible partial seizure, vs. visual aura without migraine, He denies a previous history of migraine, this episode happened 3-4 times each year. He denied previous history of seizure  UPDATE 10/10/16CM David Kim y12r old male returns for follow up-he is a paraplegic from intramedullary tumor at T10 which was removed then he became paralyzed after the surgery. He has a neurogenic bowel and bladder. He continues to have significant spasticity in his lower extremities and his torso. He denies any pain with the spasms. He sleeps well during the night. He transfers himself out of his wheelchair with a transfer board. He is on both baclofen and tizanidine and clonazepam by [name], questionable seizure activity however EEG was normal and MRI of the brain did not show any acute process.. HeMarland Kitchenreturns for reevaluation UPDATE 10/30/2017CM David Kim y69r old male returns for follow-up. He is paraplegic from a intramedullary tumor at T10 which was removed however he became paralyzed after the surgery. He has neurogenic bowel and bladder. He continues to have spasticity of his  lower extremities however he denies any pain with his spasticity. He is on both baclofen and tizanidine and needs refills. He has not had further visual disturbance, EEG was normal. He had a spider bite 3 months ago to his left thigh and right hip fracture first of  October. Apparently with the fracture it  went back into place. He returns for reevaluation REVIEW OF SYSTEMS: Full 14 system review of systems performed and notable only for those listed, all others are neg:  Constitutional: neg  Cardiovascular: leg swelling Ear/Nose/Throat: hearing loss Skin: neg Eyes: light sensitivity Respiratory: neg Gastroitestinal: neg  Genitourinary  bowel  program Hematology/Lymphatic: neg  Endocrine: neg Musculoskeletal: Paraplegia Allergy/Immunology: neg Neurological: weakness of the lower extremities,  Psychiatric: neg Sleep : neg   ALLERGIES: Allergies  Allergen Reactions  . Amoxicillin Other (See Comments)  . Endal Hd Other (See Comments)    HOME MEDICATIONS: Outpatient Medications Prior to Visit  Medication Sig Dispense Refill  . aspirin (ECOTRIN LOW STRENGTH) 81 MG EC tablet Take 81 mg by mouth daily.      . baclofen (LIORESAL) 10 MG tablet Take 10 mg by mouth 4 (four) times daily. Taking additional 24m tablet (1/2 tab in 0500 and 1700).    . baclofen (LIORESAL) 20 MG tablet Take 20 mg by mouth 4 (four) times daily.    .Marland KitchenCALCIUM-MAGNESIUM PO Take 1 tablet by mouth daily.     . clonazePAM (KLONOPIN) 2 MG tablet Take 1-2 tablets (2-4 mg total) by mouth at bedtime. FOR SPASTICITY, 90 Days (Patient taking differently: Take 4 mg by mouth at bedtime. FOR SPASTICITY, 90 Days) 180 tablet 1  . losartan (COZAAR) 100 MG tablet take 1 tablet by mouth once daily 90 tablet 3  . metoprolol tartrate (LOPRESSOR) 25 MG tablet Take 1 tablet (25 mg total) by mouth 2 (two) times daily as needed (prn BP more than 140/90). 180 tablet 3  . OVER THE COUNTER MEDICATION Take 22.5 mLs by mouth daily. Takes 1.5 tbsp  Mesotrace MV powder    . oxybutynin (DITROPAN) 5 MG tablet Take 5 mg by mouth 4 (four) times daily.     . vitamin C (ASCORBIC ACID) 500 MG tablet Take 500 mg by mouth daily.    . vitamin E (VITAMIN E) 400 UNIT capsule Take 800 Units by mouth daily.    .Marland Kitchen tiZANidine (ZANAFLEX) 2 MG tablet Take 2 mg by mouth daily.    .Marland KitchentiZANidine (ZANAFLEX) 4 MG tablet take 2 tablets by mouth four times a day if needed (Patient taking differently: take 1-2 tablets by mouth four times a day) 270 tablet 12   No facility-administered medications prior to visit.     PAST MEDICAL HISTORY: Past Medical History:  Diagnosis Date  . Allergic rhinitis   . ALLERGIC RHINITIS 10/01/2007   Qualifier: Diagnosis of  By: JJenny ReichmannMD, JHunt Oris  . Anxiety   . Carpal tunnel syndrome    right  . Depression   . Depression   . Erectile dysfunction   . Fatigue   . GERD (gastroesophageal reflux disease)   . Gout   . Hypertension   . HYPOGONADISM 03/31/2008   Qualifier: Diagnosis of  By: JJenny ReichmannMD, JHunt Oris  . IBS (irritable bowel syndrome)   . Low back pain   . OSA (obstructive sleep apnea)   . [place] 04/23/2009   Qualifier: Diagnosis of  By: JJenny ReichmannMD, JHunt Oris  . Peptic ulcer disease   .  PEPTIC ULCER DISEASE 10/01/2007   Qualifier: Diagnosis of  By: Jenny Reichmann MD, Hunt Oris   . Prostate cancer [facility])   . PROSTATE CANCER, HX OF 05/23/2007   Qualifier: Diagnosis of  By: Jenny Reichmann MD, [place] PARALYSIS 04/23/2009   Qualifier: Diagnosis of  By: Jenny Reichmann MD, Hunt Oris   . Thoracic spinal cord injury ([employer]) 03/12/2012    PAST SURGICAL HISTORY: Past Surgical History:  Procedure Laterality Date  . inguinal heniorrhaphy    . left leg surgery after fibula    . PILONIDAL CYST / SINUS EXCISION    . PROSTATECTOMY    . tonsillectomy      FAMILY HISTORY: Family History  Problem Relation Age of Onset  . High blood pressure Mother   . Dementia Mother   . Diabetes Father     SOCIAL HISTORY: Social History   Social History  . Marital status: Married    Spouse name: [name]  . Number of children: 0  . Years of education: 12   Occupational History  . retired   .  Retired   Social History Main Topics  . Smoking status: Never Smoker  . Smokeless tobacco: Never Used  .  Alcohol use No  . Drug use: No  . Sexual activity: Not on file   Other Topics Concern  . Not on file   Social History Narrative   Patient lives at home with [name]). Patient is disabled. Patient has 12 th grade education.   Right handed.   Caffeine- None     PHYSICAL EXAM  Vitals:   09/04/16 1348  BP: 124/72  Pulse: 76  Height: '5\' 8"'  (1.727 m)   There is no height or weight on file to calculate BMI. General: alert, well developed, well nourished, in no acute distress Neck: supple no carotid bruits Respiratory: clear to auscultation bilaterally Cardiovascular: regular rate rhythm Skin: no rashes   Neurologic Exam  Mental Status: mild obese, in wheel chair Cranial Nerves: CN II-XII pupils were equal round reactive to light. Extraocular movements were full. Visual fields were full on confrontational test. Facial sensation and strength were normal. Hearing was intact to finger rubbing bilaterally. Uvula tongue were midline. Head turning and shoulder shrugging were normal and symmetric. Tongue protrusion into the cheeks strength were normal.  Motor: very strong upper extremity muscles, normal tone, mild to moderate spasticity of lower extremity, minimal hip flexion movement, mild bilateral knee contraction, ankle plantar flexion. Sensory: sensory level to T10 bilaterally. Coordination: good finger-to-nose, rapid repetitive alternating movements and finger apposition  Gait and Station: can not bear weight in wheelchair Reflexes: Deep tendon reflexes: Biceps: 2/2, Brachioradialis: 2/2, Triceps: 2/2, Pateller: 2+/2+, sustained bilateral ankle clonus    DIAGNOSTIC DATA (LABS, IMAGING, TESTING) - I reviewed patient records, labs, notes, testing and imaging myself where available.  Lab Results  Component Value Date   WBC 7.9 03/28/2016   HGB 16.0 03/28/2016   HCT 48.0 03/28/2016   MCV 88.0 03/28/2016   PLT 223.0 03/28/2016      Component Value Date/Time     NA 139 03/28/2016 1502   K 3.9 03/28/2016 1502   CL 105 03/28/2016 1502   CO2 29 03/28/2016 1502   GLUCOSE 83 03/28/2016 1502   BUN 14 03/28/2016 1502   CREATININE 0.78 03/28/2016 1502   CALCIUM 9.7 03/28/2016 1502   PROT 6.8 03/28/2016 1502   ALBUMIN 4.5 03/28/2016 1502   AST 22 03/28/2016 1502   ALT  23 03/28/2016 1502   ALKPHOS 56 03/28/2016 1502   BILITOT 0.9 03/28/2016 1502   GFRNONAA 105.37 11/03/2010 1528   GFRAA  08/27/2010 0425    >60        The eGFR has been calculated using the MDRD equation. This calculation has not been validated in all clinical situations. eGFR's persistently <60 mL/min signify possible Chronic Kidney Disease.   Lab Results  Component Value Date   CHOL 136 03/28/2016   HDL 28.90 (L) 03/28/2016   LDLCALC 80 03/28/2016   LDLDIRECT 88.3 10/20/2009   TRIG 138.0 03/28/2016   CHOLHDL 5 03/28/2016    Lab Results  Component Value Date   TSH 1.81 03/28/2016      ASSESSMENT AND PLAN  71 y.o. 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   Continue Baclofen at current dose will refill Continue tizanidine at current dose Continue clonazepam at current dose refilled by Dr. Tessa Lerner Patient has not had further visual disturbance questionable seizure activity,  F/U yearly and prn next with Luan Pulling, [facility], [facility], [facility] [address], [place], [place] [address] [phone]

## 2016-09-05 NOTE — Progress Notes (Signed)
I have reviewed and agreed above plan. 

## 2016-09-18 DIAGNOSIS — S72144D Nondisplaced intertrochanteric fracture of right femur, subsequent encounter for closed fracture with routine healing: Secondary | ICD-10-CM | POA: Diagnosis not present

## 2016-09-27 ENCOUNTER — Encounter: Payer: Self-pay | Admitting: Internal Medicine

## 2016-09-27 ENCOUNTER — Ambulatory Visit (INDEPENDENT_AMBULATORY_CARE_PROVIDER_SITE_OTHER): Payer: Medicare Other | Admitting: Internal Medicine

## 2016-09-27 VITALS — BP 112/62 | HR 76 | Temp 98.5°F | Resp 16

## 2016-09-27 DIAGNOSIS — I10 Essential (primary) hypertension: Secondary | ICD-10-CM

## 2016-09-27 DIAGNOSIS — M810 Age-related osteoporosis without current pathological fracture: Secondary | ICD-10-CM | POA: Diagnosis not present

## 2016-09-27 DIAGNOSIS — S72144D Nondisplaced intertrochanteric fracture of right femur, subsequent encounter for closed fracture with routine healing: Secondary | ICD-10-CM | POA: Diagnosis not present

## 2016-09-27 DIAGNOSIS — Z23 Encounter for immunization: Secondary | ICD-10-CM | POA: Diagnosis not present

## 2016-09-27 NOTE — Progress Notes (Signed)
Subjective:    Patient ID: David Kim, male    DOB: July 03, 1946, 70 y.o.   MRN: [id number]  HPI  Here to f/u, has had what sounds like variable/labile BP control, seems some confused about the relationship of increased dosing losartan with low BP;  Has had s/s of lower BP but for some reason he is thinking he needs more losartan, so on his own is taking 150 mg.  Not currently using or supposed to use his standing machine after recent RLE fx, nonsurgical candidate as has no pain, and last f/u with some early bone regrowth per wife report.   No prior DXA.  Pt denies chest pain, increased sob or doe, wheezing, orthopnea, PND, increased LE swelling, palpitations, dizziness or syncope.  Pt denies polydipsia, polyuria,  Due flu shot, and Hep C  No other new history Past Medical History:  Diagnosis Date  . Allergic rhinitis   . ALLERGIC RHINITIS 10/01/2007   Qualifier: Diagnosis of  By: Jenny Reichmann MD, Hunt Oris   . Anxiety   . Carpal tunnel syndrome    right  . Depression   . Depression   . Erectile dysfunction   . Fatigue   . GERD (gastroesophageal reflux disease)   . Gout   . Hypertension   . HYPOGONADISM 03/31/2008   Qualifier: Diagnosis of  By: Jenny Reichmann MD, Hunt Oris   . IBS (irritable bowel syndrome)   . Low back pain   . OSA (obstructive sleep apnea)   . Paraplegia ([place]) 04/23/2009   Qualifier: Diagnosis of  By: Jenny Reichmann MD, Hunt Oris   . Peptic ulcer disease   . PEPTIC ULCER DISEASE 10/01/2007   Qualifier: Diagnosis of  By: Jenny Reichmann MD, Hunt Oris   . Prostate cancer [facility])   . PROSTATE CANCER, HX OF 05/23/2007   Qualifier: Diagnosis of  By: Jenny Reichmann MD, [place] PARALYSIS 04/23/2009   Qualifier: Diagnosis of  By: Jenny Reichmann MD, Hunt Oris   . Thoracic spinal cord injury ([place]) 03/12/2012   Past Surgical History:  Procedure Laterality Date  . inguinal heniorrhaphy    . left leg surgery after fibula    . PILONIDAL CYST / SINUS EXCISION    . PROSTATECTOMY    . tonsillectomy      reports that he has never  smoked. He has never used smokeless tobacco. He reports that he does not drink alcohol or use drugs. family history includes Dementia in his mother; Diabetes in his father; High blood pressure in his mother. Allergies  Allergen Reactions  . Amoxicillin Other (See Comments)  . Endal Hd Other (See Comments)   Current Outpatient Prescriptions on File Prior to Visit  Medication Sig Dispense Refill  . aspirin (ECOTRIN LOW STRENGTH) 81 MG EC tablet Take 81 mg by mouth daily.      . baclofen (LIORESAL) 10 MG tablet Take 1 tablet (10 mg total) by mouth 4 (four) times daily. Taking additional 10mg  tablet (1/2 tab in 0500 and 1700). 150 each 11  . baclofen (LIORESAL) 20 MG tablet Take 1 tablet (20 mg total) by mouth 4 (four) times daily. 120 tablet 11  . CALCIUM-MAGNESIUM PO Take 1 tablet by mouth daily.     . clonazePAM (KLONOPIN) 2 MG tablet Take 1-2 tablets (2-4 mg total) by mouth at bedtime. FOR SPASTICITY, 90 Days (Patient taking differently: Take 4 mg by mouth at bedtime. FOR SPASTICITY, 90 Days) 180 tablet 1  . losartan (COZAAR) 100 MG tablet take  1 tablet by mouth once daily 90 tablet 3  . metoprolol tartrate (LOPRESSOR) 25 MG tablet Take 1 tablet (25 mg total) by mouth 2 (two) times daily as needed (prn BP more than 140/90). 180 tablet 3  . OVER THE COUNTER MEDICATION Take 22.5 mLs by mouth daily. Takes 1.5 tbsp  Mesotrace MV powder    . oxybutynin (DITROPAN) 5 MG tablet Take 5 mg by mouth 4 (four) times daily.     Marland Kitchen tiZANidine (ZANAFLEX) 4 MG tablet take 1-2 tablets by mouth four times a day 240 tablet 11  . UNABLE TO FIND Med Name: Meta trace compound.    . vitamin C (ASCORBIC ACID) 500 MG tablet Take 500 mg by mouth daily.    . vitamin E (VITAMIN E) 400 UNIT capsule Take 800 Units by mouth daily.     No current facility-administered medications on file prior to visit.    Review of Systems  All other system neg per pt    Objective:   Physical Exam BP 112/62   Pulse 76   Temp 98.5 F  (36.9 C) (Oral)   Resp 16   SpO2 98%  VS noted,  Constitutional: Pt appears in no apparent distress HENT: Head: NCAT.  Right Ear: External ear normal.  Left Ear: External ear normal.  Eyes: . Pupils are equal, round, and reactive to light. Conjunctivae and EOM are normal Neck: Normal range of motion. Neck supple.  Cardiovascular: Normal rate and regular rhythm.   Pulmonary/Chest: Effort normal and breath sounds without rales or wheezing.  Abd:  Soft, ND, + BS Right upper leg mild diffusely swelling, insensate Neurological: Pt is alert. Not confused , motor grossly intact to UE's Skin: Skin is warm. No rash, no LE edema Psychiatric: Pt behavior is normal. No agitation. mild nervous    Assessment & Plan:

## 2016-09-27 NOTE — Progress Notes (Signed)
Pre visit review using our clinic review tool, if applicable. No additional management support is needed unless otherwise documented below in the visit note. 

## 2016-09-27 NOTE — Patient Instructions (Addendum)
You had the flu shot today  Please schedule the bone density test before leaving today at the scheduling desk (where you check out)  Please only take 100 mg of the losartan, as more does not help.  Please take HALF of rhe losartan if your Blood Pressure is less than 100/60  Please continue all other medications as before, and refills have been done if requested.  Please have the pharmacy call with any other refills you may need.  Please keep your appointments with your specialists as you may have planned  Please return in 6 months, or sooner if needed

## 2016-09-28 DIAGNOSIS — S72141A Displaced intertrochanteric fracture of right femur, initial encounter for closed fracture: Secondary | ICD-10-CM | POA: Insufficient documentation

## 2016-09-28 NOTE — Assessment & Plan Note (Signed)
With healing, cont f/u with ortho, non surgical, for DXA as well r/o osteoporois

## 2016-09-28 NOTE — Assessment & Plan Note (Signed)
D/w pt, to please return to 100 mg losartan instead of 150, as BP not likely to be improved signfiicantly with higher dosing and could actually lead to hypotension, which is what he is most concerned with at at this time BP Readings from Last 3 Encounters:  09/27/16 112/62  09/04/16 124/72  07/25/16 114/65

## 2016-10-04 ENCOUNTER — Encounter: Payer: Self-pay | Admitting: Internal Medicine

## 2016-10-05 ENCOUNTER — Ambulatory Visit (INDEPENDENT_AMBULATORY_CARE_PROVIDER_SITE_OTHER): Payer: Medicare Other | Admitting: Internal Medicine

## 2016-10-05 ENCOUNTER — Encounter: Payer: Self-pay | Admitting: Internal Medicine

## 2016-10-05 ENCOUNTER — Other Ambulatory Visit: Payer: Medicare Other

## 2016-10-05 VITALS — BP 124/82 | HR 70

## 2016-10-05 DIAGNOSIS — I824Y9 Acute embolism and thrombosis of unspecified deep veins of unspecified proximal lower extremity: Secondary | ICD-10-CM

## 2016-10-05 DIAGNOSIS — G4733 Obstructive sleep apnea (adult) (pediatric): Secondary | ICD-10-CM

## 2016-10-05 DIAGNOSIS — R609 Edema, unspecified: Secondary | ICD-10-CM | POA: Diagnosis not present

## 2016-10-05 LAB — D-DIMER, QUANTITATIVE: D-Dimer, Quant: 3.31 mcg/mL FEU — ABNORMAL HIGH (ref <0.50)

## 2016-10-05 NOTE — Patient Instructions (Signed)
Order- DME [facility]- We can continue CPAP 10, mask of choice, humidifier, supplies, AirView.  Dx OSA                    He needs replacement headgear please  Order- D-dimer      Dx  DVT

## 2016-10-05 NOTE — Progress Notes (Signed)
Subjective:    Patient ID: David Kim, male    DOB: [birth year], 70 y.o.   MRN: [id number]  HPI   08/05/2014-Dr. Gwenette Greet The patient comes in today for followup of his obstructive sleep apnea. He has gotten a new machine from the last visit, and is doing very well with his device. He is wearing CPAP compliantly by his download, and has excellent control of his AHI and no significant mask leaks. He feels that he is sleeping well with the device, although I have encouraged him to try to get a little more sleep.  10/04/2015-70 year old male followed for OSA/Insomnia complicated by paraplegia, GERD NPSG 2003, AHI 31 per hour auto FOLLOWS FOR: Former [place] pt. Wears nightly x 5-6hrs. Uses with naps at times. Denies problems with pressure setting. Needs new suplies- having mask leakage.   10/05/2016-70 year old male followed for OSA/Insomnia, complicated by paraplegia, GERD, HBP, GERD CPAP 10/ [facility] FOLLOWS FOR: Pt wears CPAP nightly. Denies problems with pressure setting. Pt states that his mask is too loose at times and he has to tighten it down very tight.  DME: [facility] Download shows good compliance 91%/4 hour, good control AHI 2.0/hour. We discussed mask fit and supplies. He can't get headgear quite often enough because of insurance, so the straps tend wear out-discussed. New concern-he fell and broke his right hip 3 months ago but it reset without requiring surgery. As a paraplegic, he sits most of the time. Ankles have been swelling R>L. he is only on BASA. Denies acute chest pains, syncope, sudden shortness of breath or family history of PE/DVT.  Review of Systems   + = pos Constitutional: Negative for fever and unexpected weight change.  HENT: Negative for congestion, dental problem, ear pain, nosebleeds, postnasal drip, rhinorrhea, sinus pressure, sneezing, sore throat and trouble swallowing.   Eyes: Negative for redness and itching.  Respiratory: Negative for cough, chest tightness,  shortness of breath and wheezing.   Cardiovascular: Negative for palpitations, + leg swelling.  Gastrointestinal: Negative for nausea and vomiting.  Genitourinary: Negative for dysuria.  Musculoskeletal: Negative for joint swelling.  Skin: Negative for rash.  Neurological: Negative for headaches.  Hematological: Does not bruise/bleed easily.  Psychiatric/Behavioral: Negative for dysphoric mood. The patient is not nervous/anxious.      Objective:  OBJ- Physical Exam General- Alert, Oriented, Affect-appropriate, Distress- none acute, + Wheelchair, + obese Skin- rash-none, lesions- none, excoriation- none Lymphadenopathy- none Head- atraumatic            Eyes- Gross vision intact, PERRLA, conjunctivae and secretions clear            Ears- Hearing, canals-normal            Nose- Clear, no-Septal dev, mucus, polyps, erosion, perforation             Throat- Mallampati II , mucosa clear , drainage- none, tonsils- atrophic Neck- flexible , trachea midline, no stridor , thyroid nl, carotid no bruit Chest - symmetrical excursion , unlabored           Heart/CV- RRR , no murmur , no gallop  , no rub, nl s1 s2                           - JVD- none , edema-+2-3, R>L, stasis changes- none, varices- none           Lung- clear to P&A, wheeze- none, cough- none , dullness-none, rub- none  Chest wall-  Abd-  Br/ Gen/ Rectal- Not done, not indicated Extrem- cyanosis- none, clubbing, none, atrophy- none, strength- nl Neuro- +paraplegic      Assessment & Plan:

## 2016-10-06 ENCOUNTER — Encounter (HOSPITAL_COMMUNITY): Payer: Self-pay | Admitting: *Deleted

## 2016-10-06 ENCOUNTER — Ambulatory Visit (HOSPITAL_COMMUNITY)
Admission: RE | Admit: 2016-10-06 | Discharge: 2016-10-06 | Disposition: A | Discharge status: Home / Self Care | Payer: Medicare Other | Source: Ambulatory Visit | Attending: Internal Medicine | Admitting: Internal Medicine

## 2016-10-06 ENCOUNTER — Emergency Department (HOSPITAL_COMMUNITY)
Admission: EM | Admit: 2016-10-06 | Discharge: 2016-10-06 | Disposition: A | Discharge status: Home / Self Care | Payer: Medicare Other | Attending: Emergency Medicine | Admitting: Emergency Medicine

## 2016-10-06 ENCOUNTER — Ambulatory Visit (HOSPITAL_COMMUNITY): Payer: Medicare Other

## 2016-10-06 ENCOUNTER — Ambulatory Visit (HOSPITAL_BASED_OUTPATIENT_CLINIC_OR_DEPARTMENT_OTHER)
Admission: RE | Admit: 2016-10-06 | Discharge: 2016-10-06 | Disposition: A | Discharge status: Home / Self Care | Payer: Medicare Other | Source: Ambulatory Visit | Attending: Emergency Medicine | Admitting: Emergency Medicine

## 2016-10-06 ENCOUNTER — Other Ambulatory Visit: Payer: Self-pay | Admitting: *Deleted

## 2016-10-06 DIAGNOSIS — R7989 Other specified abnormal findings of blood chemistry: Secondary | ICD-10-CM | POA: Insufficient documentation

## 2016-10-06 DIAGNOSIS — I10 Essential (primary) hypertension: Secondary | ICD-10-CM | POA: Insufficient documentation

## 2016-10-06 DIAGNOSIS — Z79899 Other long term (current) drug therapy: Secondary | ICD-10-CM | POA: Insufficient documentation

## 2016-10-06 DIAGNOSIS — R799 Abnormal finding of blood chemistry, unspecified: Secondary | ICD-10-CM | POA: Diagnosis present

## 2016-10-06 DIAGNOSIS — R791 Abnormal coagulation profile: Secondary | ICD-10-CM | POA: Diagnosis not present

## 2016-10-06 DIAGNOSIS — R609 Edema, unspecified: Secondary | ICD-10-CM | POA: Insufficient documentation

## 2016-10-06 DIAGNOSIS — Z7982 Long term (current) use of aspirin: Secondary | ICD-10-CM | POA: Diagnosis not present

## 2016-10-06 DIAGNOSIS — Z8546 Personal history of malignant neoplasm of prostate: Secondary | ICD-10-CM | POA: Diagnosis not present

## 2016-10-06 DIAGNOSIS — R600 Localized edema: Secondary | ICD-10-CM | POA: Insufficient documentation

## 2016-10-06 DIAGNOSIS — I517 Cardiomegaly: Secondary | ICD-10-CM | POA: Diagnosis not present

## 2016-10-06 DIAGNOSIS — M7989 Other specified soft tissue disorders: Secondary | ICD-10-CM

## 2016-10-06 LAB — CBC WITH DIFFERENTIAL/PLATELET
BASOS ABS: 0.1 10*3/uL (ref 0.0–0.1)
Basophils Relative: 1 %
Eosinophils Absolute: 0.4 10*3/uL (ref 0.0–0.7)
Eosinophils Relative: 4 %
HCT: 45.4 % (ref 39.0–52.0)
HEMOGLOBIN: 15 g/dL (ref 13.0–17.0)
LYMPHS ABS: 2 10*3/uL (ref 0.7–4.0)
LYMPHS PCT: 23 %
MCH: 29.8 pg (ref 26.0–34.0)
MCHC: 33 g/dL (ref 30.0–36.0)
MCV: 90.3 fL (ref 78.0–100.0)
Monocytes Absolute: 0.8 10*3/uL (ref 0.1–1.0)
Monocytes Relative: 9 %
NEUTROS PCT: 63 %
Neutro Abs: 5.5 10*3/uL (ref 1.7–7.7)
Platelets: 193 10*3/uL (ref 150–400)
RBC: 5.03 MIL/uL (ref 4.22–5.81)
RDW: 14.4 % (ref 11.5–15.5)
WBC: 8.7 10*3/uL (ref 4.0–10.5)

## 2016-10-06 LAB — BASIC METABOLIC PANEL
ANION GAP: 7 (ref 5–15)
BUN: 17 mg/dL (ref 6–20)
CHLORIDE: 107 mmol/L (ref 101–111)
CO2: 24 mmol/L (ref 22–32)
Calcium: 8.6 mg/dL — ABNORMAL LOW (ref 8.9–10.3)
Creatinine, Ser: 1 mg/dL (ref 0.61–1.24)
GFR calc Af Amer: >60 mL/min (ref >60)
Glucose, Bld: 114 mg/dL — ABNORMAL HIGH (ref 65–99)
POTASSIUM: 4.1 mmol/L (ref 3.5–5.1)
Sodium: 138 mmol/L (ref 135–145)

## 2016-10-06 LAB — PROTIME-INR
INR: 0.99
PROTHROMBIN TIME: 13.1 s (ref 11.4–15.2)

## 2016-10-06 MED ORDER — ENOXAPARIN SODIUM 100 MG/ML ~~LOC~~ SOLN
1.0000 mg/kg | Freq: Once | SUBCUTANEOUS | Status: AC
  Administered 2016-10-06: 85 mg via SUBCUTANEOUS
  Filled 2016-10-06: qty 1

## 2016-10-06 MED ORDER — IOPAMIDOL (ISOVUE-370) INJECTION 76%
INTRAVENOUS | Status: AC
  Administered 2016-10-06: 100 mL
  Filled 2016-10-06: qty 100
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
## 2016-10-06 NOTE — Assessment & Plan Note (Signed)
Download and his wife confirm satisfactory use and compliance. We discussed mask fit and frequency of strap replacement. He will work with his homecare company on this. Pressure can remain at 10.

## 2016-10-06 NOTE — Discharge Instructions (Signed)
IMPORTANT PATIENT INSTRUCTIONS:  You have been scheduled for an Outpatient Vascular Study at [facility].   ° °If tomorrow is a Saturday or Sunday, please go to the [facility] Registration Desk at 8 am tomorrow morning and tell them you are there for a vascular study.  If tomorrow is a weekday (Monday-Friday), please go to [facility]mitting [facility] at 8 am and tell them you are there for a vascular study. °

## 2016-10-06 NOTE — Assessment & Plan Note (Signed)
Paraplegic with limited mobility now noting persistent bilateral ankle edema especially since right hip fracture 3 months ago. I'm concerned he is at high risk for VTE as discussed with him and wife. Plan- D-dimer. If abnormal anticipate CTa chest, lower venous dopplers. He doesn't feel his legs. He denies concerning chest symptoms at this visit.

## 2016-10-06 NOTE — ED Triage Notes (Signed)
Pt went to pulmonologist today for a regular appt . Was called around 2330 and told the d-dimer was elevate (3.31 per chart). Pt denies chest pain or sob

## 2016-10-06 NOTE — Progress Notes (Signed)
VASCULAR LAB PRELIMINARY  PRELIMINARY  PRELIMINARY  PRELIMINARY  Bilateral lower extremity venous duplex completed.    Preliminary report:  Bilateral:  No obvious evidence of DVT, superficial thrombosis, or Baker's Cyst. Technically difficult due to swelling.    Athan Casalino, RVS 10/06/2016, 1:32 PM

## 2016-10-08 ENCOUNTER — Other Ambulatory Visit: Payer: Self-pay | Admitting: Internal Medicine

## 2016-10-17 DIAGNOSIS — L97921 Non-pressure chronic ulcer of unspecified part of left lower leg limited to breakdown of skin: Secondary | ICD-10-CM | POA: Diagnosis not present

## 2016-10-17 DIAGNOSIS — L814 Other melanin hyperpigmentation: Secondary | ICD-10-CM | POA: Diagnosis not present

## 2016-10-17 DIAGNOSIS — Z85828 Personal history of other malignant neoplasm of skin: Secondary | ICD-10-CM | POA: Diagnosis not present

## 2016-10-17 DIAGNOSIS — L218 Other seborrheic dermatitis: Secondary | ICD-10-CM | POA: Diagnosis not present

## 2016-10-18 DIAGNOSIS — S72144D Nondisplaced intertrochanteric fracture of right femur, subsequent encounter for closed fracture with routine healing: Secondary | ICD-10-CM | POA: Diagnosis not present

## 2017-02-05 ENCOUNTER — Other Ambulatory Visit: Payer: Self-pay | Admitting: Physical Medicine & Rehabilitation

## 2017-02-05 DIAGNOSIS — G839 Paralytic syndrome, unspecified: Secondary | ICD-10-CM

## 2017-02-05 DIAGNOSIS — S24109S Unspecified injury at unspecified level of thoracic spinal cord, sequela: Secondary | ICD-10-CM

## 2017-02-06 NOTE — Telephone Encounter (Signed)
Refilled clonazepam per Dr Naaman Plummer last note 06/05/16 : "Continue klonopin. can increase to 4mg  qhs for myoclonus/sleep. ". Next appt is yearly follow up 06/04/17.

## 2017-02-13 DIAGNOSIS — Z85828 Personal history of other malignant neoplasm of skin: Secondary | ICD-10-CM | POA: Diagnosis not present

## 2017-02-13 DIAGNOSIS — D1801 Hemangioma of skin and subcutaneous tissue: Secondary | ICD-10-CM | POA: Diagnosis not present

## 2017-02-13 DIAGNOSIS — L821 Other seborrheic keratosis: Secondary | ICD-10-CM | POA: Diagnosis not present

## 2017-02-13 DIAGNOSIS — L570 Actinic keratosis: Secondary | ICD-10-CM | POA: Diagnosis not present

## 2017-02-13 DIAGNOSIS — L905 Scar conditions and fibrosis of skin: Secondary | ICD-10-CM | POA: Diagnosis not present

## 2017-02-16 ENCOUNTER — Telehealth: Payer: Self-pay | Admitting: Internal Medicine

## 2017-02-16 NOTE — Telephone Encounter (Signed)
Attempted to call patient to schedule awv. Patient's phone was busy. Will try to call patient at a later time.  °

## 2017-02-19 ENCOUNTER — Telehealth: Payer: Self-pay | Admitting: Internal Medicine

## 2017-02-19 NOTE — Telephone Encounter (Signed)
Wife is requesting a letter to be written to excuse patient from jury duty at [facility] [employer] [facility] on May the 15th.  States court will need more than reason is due to "illness".  States b/c he is a  paraplegic he would not be able to sit all day in a court room.   Wife will drop copy of letter off.

## 2017-02-19 NOTE — Telephone Encounter (Signed)
Pts wife dropped off paperwork. It is in Dr Judi Cong box.

## 2017-02-20 ENCOUNTER — Telehealth: Payer: Self-pay

## 2017-02-20 ENCOUNTER — Encounter: Payer: Self-pay | Admitting: Internal Medicine

## 2017-02-20 NOTE — Telephone Encounter (Signed)
Informed pt that Madaline Savage duty letter was ready for pick up and will be at the front desk when he has free time to pick it up.

## 2017-02-20 NOTE — Telephone Encounter (Signed)
Dr.John this paperwork is is your stack.

## 2017-02-20 NOTE — Telephone Encounter (Signed)
Done hardcopy to Shirron  

## 2017-03-29 ENCOUNTER — Encounter: Payer: Self-pay | Admitting: Internal Medicine

## 2017-03-29 ENCOUNTER — Other Ambulatory Visit (INDEPENDENT_AMBULATORY_CARE_PROVIDER_SITE_OTHER): Payer: Medicare Other

## 2017-03-29 ENCOUNTER — Ambulatory Visit (INDEPENDENT_AMBULATORY_CARE_PROVIDER_SITE_OTHER): Payer: Medicare Other | Admitting: Internal Medicine

## 2017-03-29 VITALS — BP 124/76 | HR 69

## 2017-03-29 DIAGNOSIS — R7989 Other specified abnormal findings of blood chemistry: Secondary | ICD-10-CM | POA: Diagnosis not present

## 2017-03-29 DIAGNOSIS — Z1159 Encounter for screening for other viral diseases: Secondary | ICD-10-CM

## 2017-03-29 DIAGNOSIS — I10 Essential (primary) hypertension: Secondary | ICD-10-CM

## 2017-03-29 DIAGNOSIS — R739 Hyperglycemia, unspecified: Secondary | ICD-10-CM

## 2017-03-29 DIAGNOSIS — E785 Hyperlipidemia, unspecified: Secondary | ICD-10-CM

## 2017-03-29 DIAGNOSIS — K219 Gastro-esophageal reflux disease without esophagitis: Secondary | ICD-10-CM | POA: Diagnosis not present

## 2017-03-29 LAB — CBC WITH DIFFERENTIAL/PLATELET
BASOS PCT: 0.6 % (ref 0.0–3.0)
Basophils Absolute: 0 10*3/uL (ref 0.0–0.1)
EOS PCT: 5.6 % — AB (ref 0.0–5.0)
Eosinophils Absolute: 0.4 10*3/uL (ref 0.0–0.7)
HCT: 47.6 % (ref 39.0–52.0)
HEMOGLOBIN: 15.9 g/dL (ref 13.0–17.0)
Lymphocytes Relative: 23.7 % (ref 12.0–46.0)
Lymphs Abs: 1.8 10*3/uL (ref 0.7–4.0)
MCHC: 33.4 g/dL (ref 30.0–36.0)
MCV: 89 fl (ref 78.0–100.0)
Monocytes Absolute: 0.7 10*3/uL (ref 0.1–1.0)
Monocytes Relative: 8.9 % (ref 3.0–12.0)
NEUTROS ABS: 4.6 10*3/uL (ref 1.4–7.7)
Neutrophils Relative %: 61.2 % (ref 43.0–77.0)
PLATELETS: 204 10*3/uL (ref 150.0–400.0)
RBC: 5.35 Mil/uL (ref 4.22–5.81)
RDW: 15 % (ref 11.5–15.5)
WBC: 7.5 10*3/uL (ref 4.0–10.5)

## 2017-03-29 LAB — BASIC METABOLIC PANEL
BUN: 15 mg/dL (ref 6–23)
CHLORIDE: 105 meq/L (ref 96–112)
CO2: 29 mEq/L (ref 19–32)
CREATININE: 0.89 mg/dL (ref 0.40–1.50)
Calcium: 9.4 mg/dL (ref 8.4–10.5)
GFR: 90.04 mL/min (ref >60.00)
Glucose, Bld: 83 mg/dL (ref 70–99)
POTASSIUM: 4.3 meq/L (ref 3.5–5.1)
Sodium: 139 mEq/L (ref 135–145)

## 2017-03-29 LAB — URINALYSIS, ROUTINE W REFLEX MICROSCOPIC
BILIRUBIN URINE: NEGATIVE
Hgb urine dipstick: NEGATIVE
KETONES UR: NEGATIVE
Leukocytes, UA: NEGATIVE
Nitrite: NEGATIVE
RBC / HPF: NONE SEEN (ref 0–?)
SPECIFIC GRAVITY, URINE: 1.02 (ref 1.000–1.030)
Total Protein, Urine: NEGATIVE
Urine Glucose: NEGATIVE
Urobilinogen, UA: 0.2 (ref 0.0–1.0)
WBC, UA: NONE SEEN (ref 0–?)
pH: 6.5 (ref 5.0–8.0)

## 2017-03-29 LAB — LIPID PANEL
CHOLESTEROL: 138 mg/dL (ref 0–200)
HDL: 32.8 mg/dL — ABNORMAL LOW (ref >39.00)
NonHDL: 105.14
Total CHOL/HDL Ratio: 4
Triglycerides: 262 mg/dL — ABNORMAL HIGH (ref 0.0–149.0)
VLDL: 52.4 mg/dL — ABNORMAL HIGH (ref 0.0–40.0)

## 2017-03-29 LAB — HEMOGLOBIN A1C: HEMOGLOBIN A1C: 5.3 % (ref 4.6–6.5)

## 2017-03-29 LAB — HEPATIC FUNCTION PANEL
ALT: 16 U/L (ref 0–53)
AST: 19 U/L (ref 0–37)
Albumin: 4.4 g/dL (ref 3.5–5.2)
Alkaline Phosphatase: 82 U/L (ref 39–117)
BILIRUBIN DIRECT: 0.2 mg/dL (ref 0.0–0.3)
BILIRUBIN TOTAL: 0.6 mg/dL (ref 0.2–1.2)
Total Protein: 6.9 g/dL (ref 6.0–8.3)

## 2017-03-29 LAB — TSH: TSH: 2.54 u[IU]/mL (ref 0.35–4.50)

## 2017-03-29 LAB — LDL CHOLESTEROL, DIRECT: Direct LDL: 78 mg/dL

## 2017-03-29 MED ORDER — LOSARTAN POTASSIUM 50 MG PO TABS: 50.0000 mg | ORAL_TABLET | Freq: Two times a day (BID) | ORAL | 3 refills | Status: DC

## 2017-03-29 NOTE — Assessment & Plan Note (Signed)
Goal ldl < 100, for fu labs today, lower chol diet

## 2017-03-29 NOTE — Assessment & Plan Note (Signed)
.  stable overall by history and exam, recent data reviewed with pt, and pt to continue medical treatment as before,  to f/u any worsening symptoms or concerns No results found for: HGBA1C For fu a1c today

## 2017-03-29 NOTE — Assessment & Plan Note (Signed)
stable overall by history and exam, recent data reviewed with pt, and pt to continue medical treatment as before,  to f/u any worsening symptoms or concerns BP Readings from Last 3 Encounters:  03/29/17 124/76  10/06/16 121/76  10/05/16 124/82

## 2017-03-29 NOTE — Patient Instructions (Signed)

## 2017-03-29 NOTE — Progress Notes (Signed)
Subjective:    Patient ID: David Kim, male    DOB: June 15, 1946, 71 y.o.   MRN: [id number]  HPI   Here for yearly f/u;  Overall doing ok;  Pt denies Chest pain, worsening SOB, DOE, wheezing, orthopnea, PND, worsening LE edema, palpitations, dizziness or syncope.  Pt denies neurological change such as new headache, facial or extremity weakness.  Pt denies polydipsia, polyuria, or low sugar symptoms. Pt states overall good compliance with treatment and medications, good tolerability, and has been trying to follow appropriate diet.  Pt denies worsening depressive symptoms, suicidal ideation or panic. No fever, night sweats, wt loss, loss of appetite, or other constitutional symptoms.  Pt states good ability with ADL's, has high fall risk, home safety reviewed and adequate, no other significant changes in hearing or vision.  BP has been much less labile with taking the losartan at 100 mg - 1/2 bid.   Denies worsening reflux, abd pain, dysphagia, n/v, bowel change or blood. Past Medical History:  Diagnosis Date  . Allergic rhinitis   . ALLERGIC RHINITIS 10/01/2007   Qualifier: Diagnosis of  By: Jenny Reichmann MD, Hunt Oris   . Anxiety   . Carpal tunnel syndrome    right  . Depression   . Depression   . Erectile dysfunction   . Fatigue   . GERD (gastroesophageal reflux disease)   . Gout   . Hypertension   . HYPOGONADISM 03/31/2008   Qualifier: Diagnosis of  By: Jenny Reichmann MD, Hunt Oris   . IBS (irritable bowel syndrome)   . Low back pain   . OSA (obstructive sleep apnea)   . Paraplegia ([place]) 04/23/2009   Qualifier: Diagnosis of  By: Jenny Reichmann MD, Hunt Oris   . Peptic ulcer disease   . PEPTIC ULCER DISEASE 10/01/2007   Qualifier: Diagnosis of  By: Jenny Reichmann MD, Hunt Oris   . Prostate cancer [facility])   . PROSTATE CANCER, HX OF 05/23/2007   Qualifier: Diagnosis of  By: Jenny Reichmann MD, [facility] [facility] 04/23/2009   Qualifier: Diagnosis of  By: Jenny Reichmann MD, Hunt Oris   . Thoracic spinal cord injury ([place]) 03/12/2012   Past  Surgical History:  Procedure Laterality Date  . inguinal heniorrhaphy    . left leg surgery after fibula    . PILONIDAL CYST / SINUS EXCISION    . PROSTATECTOMY    . tonsillectomy      reports that he has never smoked. He has never used smokeless tobacco. He reports that he does not drink alcohol or use drugs. family history includes Dementia in his mother; Diabetes in his father; High blood pressure in his mother. Allergies  Allergen Reactions  . Amoxicillin Other (See Comments)  . Endal Hd Other (See Comments)   Current Outpatient Prescriptions on File Prior to Visit  Medication Sig Dispense Refill  . aspirin (ECOTRIN LOW STRENGTH) 81 MG EC tablet Take 81 mg by mouth daily.      . baclofen (LIORESAL) 10 MG tablet Take 1 tablet (10 mg total) by mouth 4 (four) times daily. Taking additional 10mg  tablet (1/2 tab in 0500 and 1700). 150 each 11  . baclofen (LIORESAL) 20 MG tablet Take 1 tablet (20 mg total) by mouth 4 (four) times daily. 120 tablet 11  . CALCIUM-MAGNESIUM PO Take 1 tablet by mouth daily.     . clonazePAM (KLONOPIN) 2 MG tablet Take 2 tablets (4 mg total) by mouth at bedtime. 180 tablet 1  . OVER THE  COUNTER MEDICATION Take 22.5 mLs by mouth daily. Takes 1.5 tbsp  Mesotrace MV powder    . oxybutynin (DITROPAN) 5 MG tablet Take 5 mg by mouth 4 (four) times daily.     Marland Kitchen tiZANidine (ZANAFLEX) 4 MG tablet take 1-2 tablets by mouth four times a day 240 tablet 11  . vitamin C (ASCORBIC ACID) 500 MG tablet Take 500 mg by mouth daily.    . vitamin E (VITAMIN E) 400 UNIT capsule Take 800 Units by mouth daily.     No current facility-administered medications on file prior to visit.    Review of Systems Constitutional: Negative for other unusual diaphoresis, sweats, appetite or weight changes HENT: Negative for other worsening hearing loss, ear pain, facial swelling, mouth sores or neck stiffness.   Eyes: Negative for other worsening pain, redness or other visual disturbance.    Respiratory: Negative for other stridor or swelling Cardiovascular: Negative for other palpitations or other chest pain  Gastrointestinal: Negative for worsening diarrhea or loose stools, blood in stool, distention or other pain Genitourinary: Negative for hematuria, flank pain or other change in urine volume.  Musculoskeletal: Negative for myalgias or other joint swelling.  Skin: Negative for other color change, or other wound or worsening drainage.  Neurological: Negative for other syncope or numbness. Hematological: Negative for other adenopathy or swelling Psychiatric/Behavioral: Negative for hallucinations, other worsening agitation, SI, self-injury, or new decreased concentration All other system neg per pt    Objective:   Physical Exam BP 124/76   Pulse 69   SpO2 99%  VS noted, in wheelchair Constitutional: Pt is oriented to person, place, and time. Appears well-developed and well-nourished, in no significant distress and comfortable Head: Normocephalic and atraumatic  Eyes: Conjunctivae and EOM are normal. Pupils are equal, round, and reactive to light Right Ear: External ear normal without discharge Left Ear: External ear normal without discharge Nose: Nose without discharge or deformity Mouth/Throat: Oropharynx is without other ulcerations and moist  Neck: Normal range of motion. Neck supple. No JVD present. No tracheal deviation present or significant neck LA or mass Cardiovascular: Normal rate, regular rhythm, normal heart sounds and intact distal pulses.   Pulmonary/Chest: WOB normal and breath sounds without rales or wheezing  Abdominal: Soft. Bowel sounds are normal. NT. No HSM  Musculoskeletal: Normal range of motion. Exhibits no edema Lymphadenopathy: Has no other cervical adenopathy.  Neurological: Pt is alert and oriented to person, place, and time. Pt has normal reflexes. No cranial nerve deficit. Motor grossly intact except + paraplegic Skin: Skin is warm and  dry. No rash noted or new ulcerations Psychiatric:  Has normal mood and affect. Behavior is normal without agitation No other exam findings Lab Results  Component Value Date   WBC 8.7 10/06/2016   HGB 15.0 10/06/2016   HCT 45.4 10/06/2016   PLT 193 10/06/2016   GLUCOSE 114 (H) 10/06/2016   CHOL 136 03/28/2016   TRIG 138.0 03/28/2016   HDL 28.90 (L) 03/28/2016   LDLDIRECT 88.3 10/20/2009   LDLCALC 80 03/28/2016   ALT 23 03/28/2016   AST 22 03/28/2016   NA 138 10/06/2016   K 4.1 10/06/2016   CL 107 10/06/2016   CREATININE 1.00 10/06/2016   BUN 17 10/06/2016   CO2 24 10/06/2016   TSH 1.81 03/28/2016   PSA 0.04 (L) 04/04/2013   INR 0.99 10/06/2016       Assessment & Plan:

## 2017-03-29 NOTE — Assessment & Plan Note (Signed)
stable overall by history and exam, recent data reviewed with pt, and pt to continue medical treatment as before,  to f/u any worsening symptoms or concerns  

## 2017-03-30 ENCOUNTER — Encounter: Payer: Self-pay | Admitting: Internal Medicine

## 2017-03-30 LAB — HEPATITIS C ANTIBODY: HCV AB: NEGATIVE

## 2017-04-09 ENCOUNTER — Telehealth: Payer: Self-pay | Admitting: *Deleted

## 2017-04-09 NOTE — Telephone Encounter (Addendum)
David Kim called and says that he accidentally threw out his clonazepam.  He filled it on 02/07/17 (90 day) and he only has about a week left. It is not due to be refilled before the 05/07/17. His appt with Dr Naaman Plummer is7/30/18  Please advise.

## 2017-04-10 ENCOUNTER — Other Ambulatory Visit: Payer: Self-pay

## 2017-04-10 DIAGNOSIS — G839 Paralytic syndrome, unspecified: Secondary | ICD-10-CM

## 2017-04-10 DIAGNOSIS — S24109S Unspecified injury at unspecified level of thoracic spinal cord, sequela: Secondary | ICD-10-CM

## 2017-04-10 MED ORDER — CLONAZEPAM 2 MG PO TABS: 2.0000 mg | ORAL_TABLET | Freq: Two times a day (BID) | ORAL | 0 refills | Status: DC

## 2017-04-10 MED ORDER — CLONAZEPAM 2 MG PO TABS: 4.0000 mg | ORAL_TABLET | Freq: Every day | ORAL | 1 refills | Status: DC

## 2017-04-10 NOTE — Telephone Encounter (Signed)
We can RF

## 2017-04-10 NOTE — Telephone Encounter (Signed)
I spoke with David Kim and his 81 day supply was filled 02/07/17.  He has about 2 weeks of 2mg  tablets in his pill box.  He normally takes one 2mg  tablet around 9 pm and the other is in his daily pill box, so a total of 4mg  per day.  I found a coupon for cash price for the #60 2 mg tablets for $10 with a coupon from [facility].  I am sure his insurance will not cover the refill since it is not due until 05/07/17.  He is thinking he can substitute a tizanidine for the dose but I have explained to him this is a benzodiazepine and he cannot just substitute something else not in the same category without withdrawal symptoms.  I will ask Dr David Kim what his options are to avoid having to get a new prescription at a different pharmacy with the coupon.

## 2017-04-10 NOTE — Telephone Encounter (Signed)
Mr Zambito called back and has printed his coupon. I will call #60 clonazepam 2 mg tablets to [facility] on [address] and explain to the pharmacy what is going on and that we are writing him a prescription for 30 days to be paid for by cash with coupon and then he will be able to resume his 90 day fills after he has completed that Rx.  This should not be needed until about 04/17/17 and then he will take for 30 days through 05/17/17 at which time he should be able to resume getting 90 day fill at his routine [facility] with his insurance. I have explained this to Mr Desa and reinforced that this is a DEA controlled substance and he cannot request his refill of the 90 days before he has completed the 30 day fill or else he will trigger the pharmacy to deny fills because of looking like he is trying to get multiple prescriptions.  I reinforced that he must take special care not to loose any more medication or we will no longer be able to prescribe.  He understands.

## 2017-06-04 ENCOUNTER — Encounter: Payer: Medicare Other | Attending: Physical Medicine & Rehabilitation | Admitting: Physical Medicine & Rehabilitation

## 2017-06-04 ENCOUNTER — Encounter: Payer: Self-pay | Admitting: Physical Medicine & Rehabilitation

## 2017-06-04 VITALS — BP 125/80 | HR 66

## 2017-06-04 DIAGNOSIS — T63301A Toxic effect of unspecified spider venom, accidental (unintentional), initial encounter: Secondary | ICD-10-CM | POA: Insufficient documentation

## 2017-06-04 DIAGNOSIS — S24109S Unspecified injury at unspecified level of thoracic spinal cord, sequela: Secondary | ICD-10-CM | POA: Insufficient documentation

## 2017-06-04 DIAGNOSIS — K589 Irritable bowel syndrome without diarrhea: Secondary | ICD-10-CM | POA: Diagnosis not present

## 2017-06-04 DIAGNOSIS — F419 Anxiety disorder, unspecified: Secondary | ICD-10-CM | POA: Insufficient documentation

## 2017-06-04 DIAGNOSIS — Z8546 Personal history of malignant neoplasm of prostate: Secondary | ICD-10-CM | POA: Diagnosis not present

## 2017-06-04 DIAGNOSIS — I10 Essential (primary) hypertension: Secondary | ICD-10-CM | POA: Diagnosis not present

## 2017-06-04 DIAGNOSIS — G4733 Obstructive sleep apnea (adult) (pediatric): Secondary | ICD-10-CM | POA: Insufficient documentation

## 2017-06-04 DIAGNOSIS — Z8249 Family history of ischemic heart disease and other diseases of the circulatory system: Secondary | ICD-10-CM | POA: Insufficient documentation

## 2017-06-04 DIAGNOSIS — G5600 Carpal tunnel syndrome, unspecified upper limb: Secondary | ICD-10-CM | POA: Insufficient documentation

## 2017-06-04 DIAGNOSIS — Z9889 Other specified postprocedural states: Secondary | ICD-10-CM | POA: Diagnosis not present

## 2017-06-04 DIAGNOSIS — F329 Major depressive disorder, single episode, unspecified: Secondary | ICD-10-CM | POA: Diagnosis not present

## 2017-06-04 DIAGNOSIS — R531 Weakness: Secondary | ICD-10-CM | POA: Diagnosis not present

## 2017-06-04 DIAGNOSIS — R682 Dry mouth, unspecified: Secondary | ICD-10-CM | POA: Diagnosis not present

## 2017-06-04 DIAGNOSIS — G8220 Paraplegia, unspecified: Secondary | ICD-10-CM | POA: Insufficient documentation

## 2017-06-04 DIAGNOSIS — K219 Gastro-esophageal reflux disease without esophagitis: Secondary | ICD-10-CM | POA: Insufficient documentation

## 2017-06-04 DIAGNOSIS — Z833 Family history of diabetes mellitus: Secondary | ICD-10-CM | POA: Insufficient documentation

## 2017-06-04 DIAGNOSIS — M109 Gout, unspecified: Secondary | ICD-10-CM | POA: Insufficient documentation

## 2017-06-04 NOTE — Progress Notes (Signed)
Subjective:    Patient ID: David Kim, male    DOB: [birth year], 71 y.o.   MRN: [id number]  HPI   David Kim is here in follow up of this T10 SCI. He has noticed that he has had some improvement in his trunk control. He has been able to use a w/c without side rails now which offers him more mobility. He had a fracture in right hip last year which has since healed. He is taking mineral supps to help with bone density. He is not using vitamin d.im  He remains on klonopin for spasticity. They still cause some sedation especially if he takes two at a time.   David Kim complains of dry mouth and problems with his teeth. He will need several crowns apparently due to multiple cavities.   Pain Inventory Average Pain 0 Pain Right Now 0 My pain is .  In the last 24 hours, has pain interfered with the following? General activity 0 Relation with others 0 Enjoyment of life 0 What TIME of day is your pain at its worst? na Sleep (in general) Good  Pain is worse with: na Pain improves with: na Relief from Meds: na  Mobility ability to climb steps?  no do you drive?  no  Function retired I need assistance with the following:  bathing, toileting, meal prep, household duties and shopping  Neuro/Psych bladder control problems weakness numbness trouble walking spasms  Prior Studies Any changes since last visit?  no  Physicians involved in your care Any changes since last visit?  no   Family History  Problem Relation Age of Onset  . High blood pressure Mother   . Dementia Mother   . Diabetes Father    Social History   Social History  . Marital status: Married    Spouse name: [name]  . Number of children: 0  . Years of education: 12   Occupational History  . retired   .  Retired   Social History Main Topics  . Smoking status: Never Smoker  . Smokeless tobacco: Never Used  . Alcohol use No  . Drug use: No  . Sexual activity: Not on file   Other Topics Concern  . Not on  file   Social History Narrative   Patient lives at home with [name]). Patient is disabled. Patient has 12 th grade education.   Right handed.   Caffeine- None   Past Surgical History:  Procedure Laterality Date  . inguinal heniorrhaphy    . left leg surgery after fibula    . PILONIDAL CYST / SINUS EXCISION    . PROSTATECTOMY    . tonsillectomy     Past Medical History:  Diagnosis Date  . Allergic rhinitis   . ALLERGIC RHINITIS 10/01/2007   Qualifier: Diagnosis of  By: Jenny Reichmann MD, Hunt Oris   . Anxiety   . Carpal tunnel syndrome    right  . Depression   . Depression   . Erectile dysfunction   . Fatigue   . GERD (gastroesophageal reflux disease)   . Gout   . Hypertension   . HYPOGONADISM 03/31/2008   Qualifier: Diagnosis of  By: Jenny Reichmann MD, Hunt Oris   . IBS (irritable bowel syndrome)   . Low back pain   . OSA (obstructive sleep apnea)   . Paraplegia ([place]) 04/23/2009   Qualifier: Diagnosis of  By: Jenny Reichmann MD, Hunt Oris   . Peptic ulcer disease   . PEPTIC ULCER DISEASE 10/01/2007  Qualifier: Diagnosis of  By: Jenny Reichmann MD, Hunt Oris   . Prostate cancer [facility])   . PROSTATE CANCER, HX OF 05/23/2007   Qualifier: Diagnosis of  By: Jenny Reichmann MD, [place] [facility] 04/23/2009   Qualifier: Diagnosis of  By: Jenny Reichmann MD, Hunt Oris   . Thoracic spinal cord injury ([place]) 03/12/2012   There were no vitals taken for this visit.  Opioid Risk Score:   Fall Risk Score:  `1  Depression screen PHQ 2/9  Depression screen [facility] 2/9 03/29/2017 09/27/2016 09/27/2016 04/22/2015 04/15/2014 10/14/2013  Decreased Interest 0 0 0 0 0 0  Down, Depressed, Hopeless 0 0 0 0 0 1  PHQ - 2 Score 0 0 0 0 0 1     Review of Systems  Constitutional: Negative.   HENT: Negative.   Eyes: Negative.   Respiratory: Negative.   Cardiovascular: Negative.   Gastrointestinal: Positive for constipation.  Endocrine: Negative.   Genitourinary: Positive for difficulty urinating.  Musculoskeletal: Positive for joint swelling.    Skin: Negative.   Allergic/Immunologic: Negative.   Neurological: Negative.   Hematological: Negative.   Psychiatric/Behavioral: Negative.   All other systems reviewed and are negative.      Objective:   Physical Exam Constitutional: He appears well-developed and well-nourished.  HENT:  Head: Normocephalic and atraumatic.  Eyes: Conjunctivae and EOM are normal. Pupils are equal, round, and reactive to light.  Neck: Normal range of motion. Neck supple.  Cardiovascular: Normal rate and regular rhythm.  Pulmonary/Chest: Effort normal and breath sounds normal.  Neurological:  No volitional movement in the legs except for trace hip control. No changes.He has no sensation below the level of injury. He still has 0 resting tone in the legs. DTR's are hyperactive. He has sustained clonus in both ankles with ROM . UE strength is normal.   Sitting posture is good. UE strength is 5/5.  Skin:    Psychiatric: He has a normal mood and affect. His behavior is normal. Judgment and thought content normal. tangential    Assessment & Plan:  ASSESSMENT:  1. 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  2. Hypertension.  3. Left thigh spider bite.    PLAN:   1. Continue with baclofen as writen -tizanidine is at 28mg  daily. --consider taper back to 16 as it appears the 28mg  is still a bit neurosedating for him. 2. Continue klonopin.  Continue 2-4mg  qhs for myoclonus/sleep.  3. Discussed vitamin supplementation for bone density, standing frame/weight bearing. Needs to add calcium 4. Wean ditropan as tolerated for dry mouth. Consider drop to bid dosing. He might want to discuss with urology. ?consider myrbetriq trial 5. 15 minutes of face to face patient care time were spent during this visit. All questions were encouraged and answered. Follow up in a year.

## 2017-06-04 NOTE — Patient Instructions (Addendum)
TRY TO WEAN DITROPAN DOWN BY ONE PILL EVERY TWO WEEKS TO SEE IF IT HELPS YOUR DRY MOUTH. CONSIDER GOING DOWN TO THREE X DAILY INITIALLY AND THEN TO 2 X DAILY.  IF THAT DOESN'T WORK OUT WE CAN TRY SOMETHING DIFFERENT OR YOU CAN CONTACT DR. Jeffie Pollock.  CONSIDER WEANING ZANAFLEX TO 4-4-4-8 DAILY TO HELP WITH FATIGUE  STAY AS ACTIVE AS YOU CAN  PLEASE FEEL FREE TO CALL OUR OFFICE WITH ANY PROBLEMS OR QUESTIONS ([phone])

## 2017-06-16 ENCOUNTER — Other Ambulatory Visit: Payer: Self-pay | Admitting: Physical Medicine & Rehabilitation

## 2017-06-16 DIAGNOSIS — S24109S Unspecified injury at unspecified level of thoracic spinal cord, sequela: Secondary | ICD-10-CM

## 2017-06-16 DIAGNOSIS — G839 Paralytic syndrome, unspecified: Secondary | ICD-10-CM

## 2017-06-18 MED ORDER — CLONAZEPAM 2 MG PO TABS: 4.0000 mg | ORAL_TABLET | Freq: Every day | ORAL | 1 refills | Status: DC

## 2017-08-02 DIAGNOSIS — C61 Malignant neoplasm of prostate: Secondary | ICD-10-CM | POA: Diagnosis not present

## 2017-08-09 DIAGNOSIS — Z8546 Personal history of malignant neoplasm of prostate: Secondary | ICD-10-CM | POA: Diagnosis not present

## 2017-08-09 DIAGNOSIS — N319 Neuromuscular dysfunction of bladder, unspecified: Secondary | ICD-10-CM | POA: Diagnosis not present

## 2017-08-09 DIAGNOSIS — N281 Cyst of kidney, acquired: Secondary | ICD-10-CM | POA: Diagnosis not present

## 2017-08-09 DIAGNOSIS — N3946 Mixed incontinence: Secondary | ICD-10-CM | POA: Diagnosis not present

## 2017-08-09 DIAGNOSIS — N200 Calculus of kidney: Secondary | ICD-10-CM | POA: Diagnosis not present

## 2017-09-04 ENCOUNTER — Other Ambulatory Visit: Payer: Self-pay | Admitting: Nurse Practitioner

## 2017-09-04 ENCOUNTER — Encounter: Payer: Self-pay | Admitting: Neurology

## 2017-09-04 ENCOUNTER — Ambulatory Visit (INDEPENDENT_AMBULATORY_CARE_PROVIDER_SITE_OTHER): Payer: Medicare Other | Admitting: Neurology

## 2017-09-04 VITALS — BP 106/58 | HR 81

## 2017-09-04 DIAGNOSIS — S24109S Unspecified injury at unspecified level of thoracic spinal cord, sequela: Secondary | ICD-10-CM | POA: Diagnosis not present

## 2017-09-04 DIAGNOSIS — G8220 Paraplegia, unspecified: Secondary | ICD-10-CM

## 2017-09-04 MED ORDER — TIZANIDINE HCL 4 MG PO TABS: ORAL_TABLET | ORAL | 4 refills | Status: DC

## 2017-09-04 MED ORDER — BACLOFEN 20 MG PO TABS: 20.0000 mg | ORAL_TABLET | Freq: Four times a day (QID) | ORAL | 4 refills | Status: DC

## 2017-09-04 NOTE — Progress Notes (Signed)
[facility]  PATIENT: David Kim DOB: Nov 11, 1945    HISTORY OF PRESENT ILLNESS: HISTORY:David Kim came in to refill his medications, he is accompanied by his wife, sitting wheelchair Mr. Montesinos developed weakness in his legs in 2009. He slipped on ice in January 2009 and fractures ankles requiring pinning procedures. Following that time he developed progressive leg weakness. 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 It became progressively less safe for him to do that. He began to experience difficulty getting out of a car.  He had MRI imaging studies of his head, cervical, and lumbar spine. 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 Following the procedure, the patient became paraplegic, a hematoma was found in the operative site when he was reopened. He did not regain his ability to move his legs which he had been able to do preoperatively.He is a sensory level below the umbilicus. He has neurogenic bowel and bladder and wears diapers all the time. Occasional UTI. He was evaluated by Dr. Gaynell Face, deemed not a baclofen pump candidate due to nonambulatory, no significant pain. The patient has spasticity in his lower extremities and his torso, but he is not experiencing pain when he has spasms. The spasticity does not awaken him at night time. His tone has not increased to the point where it is difficult to dress him, perform hygiene, or to place him in his wheelchair. He self transfer himself in and out of wheelchair. He is on both baclofen 20+10mg + 5mg  qid and tizanidine 4mg  2 tabs q6hours, clonazepam 2mg  1/2 qam, one qhs. He complains of dry mouth, also taking oxybutynin, but no excessive drowsiness, wife has been stretching his legs on daily basis. He has no significant bilateral lower extremity movement, sensory level from umbilical down no significant  pain.  UPDATE Oct 8th 2015:He has lower extremity spasm, which is overall manageable recurrent baclofen 20 mg, 1 and half tablets 4 times a day, tizanidine as needed, clonazepam 2 mg every night Today he also reported episode of seeing spider webs in his visual field, with associated blurry vision, mild confusion, lasting 10-15 minutes, there was no associated headaches, which raised the possibility of possible partial seizure, vs. visual aura without migraine, He denies a previous history of migraine, this episode happened 3-4 times each year. He denied previous history of seizure  MRI of the brain did not show any acute process.Marland Kitchen He returns for reevaluation  UPDATE Sep 04 2017: He complains of dry mouth,  He dose bowel regime every morning,   He is now taking zanaflex 4mg  7 pills a day, balcofen 20mg  4 times a day, clonazepam 2mg  2 tabs qhs, oxybutynin 5mg  4 times a day.    REVIEW OF SYSTEMS: Full 14 system review of systems performed and notable only for those listed, all others are neg:     ALLERGIES: Allergies  Allergen Reactions  . Amoxicillin Other (See Comments)  . Endal Hd Other (See Comments)    HOME MEDICATIONS: Outpatient Medications Prior to Visit  Medication Sig Dispense Refill  . aspirin (ECOTRIN LOW STRENGTH) 81 MG EC tablet Take 81 mg by mouth daily.      . baclofen (LIORESAL) 10 MG tablet Take 1 tablet (10 mg total) by mouth 4 (four) times daily. Taking additional 10mg  tablet (1/2 tab in 0500 and 1700). 150 each 11  . baclofen (LIORESAL) 20 MG tablet Take  1 tablet (20 mg total) by mouth 4 (four) times daily. 120 tablet 11  . CALCIUM-MAGNESIUM PO Take 1 tablet by mouth daily.     . clonazePAM (KLONOPIN) 2 MG tablet Take 2 tablets (4 mg total) by mouth at bedtime. 180 tablet 1  . losartan (COZAAR) 50 MG tablet Take 1 tablet (50 mg total) by mouth 2 (two) times daily. 180 tablet 3  . OVER THE COUNTER MEDICATION Take 22.5 mLs by mouth daily. Takes 1.5 tbsp  Mesotrace  MV powder    . oxybutynin (DITROPAN) 5 MG tablet Take 5 mg by mouth 4 (four) times daily.     Marland Kitchen tiZANidine (ZANAFLEX) 4 MG tablet take 1-2 tablets by mouth four times a day 240 tablet 11  . vitamin C (ASCORBIC ACID) 500 MG tablet Take 500 mg by mouth daily.    . vitamin E (VITAMIN E) 400 UNIT capsule Take 800 Units by mouth daily.     No facility-administered medications prior to visit.     PAST MEDICAL HISTORY: Past Medical History:  Diagnosis Date  . Allergic rhinitis   . ALLERGIC RHINITIS 10/01/2007   Qualifier: Diagnosis of  By: Jenny Reichmann MD, Hunt Oris   . Anxiety   . Carpal tunnel syndrome    right  . Depression   . Depression   . Erectile dysfunction   . Fatigue   . GERD (gastroesophageal reflux disease)   . Gout   . Hypertension   . HYPOGONADISM 03/31/2008   Qualifier: Diagnosis of  By: Jenny Reichmann MD, Hunt Oris   . IBS (irritable bowel syndrome)   . Low back pain   . OSA (obstructive sleep apnea)   . Paraplegia ([place]) 04/23/2009   Qualifier: Diagnosis of  By: Jenny Reichmann MD, Hunt Oris   . Peptic ulcer disease   . PEPTIC ULCER DISEASE 10/01/2007   Qualifier: Diagnosis of  By: Jenny Reichmann MD, Hunt Oris   . Prostate cancer [facility])   . PROSTATE CANCER, HX OF 05/23/2007   Qualifier: Diagnosis of  By: Jenny Reichmann MD, [place] PARALYSIS 04/23/2009   Qualifier: Diagnosis of  By: Jenny Reichmann MD, Hunt Oris   . Thoracic spinal cord injury ([place]) 03/12/2012    PAST SURGICAL HISTORY: Past Surgical History:  Procedure Laterality Date  . inguinal heniorrhaphy    . left leg surgery after fibula    . PILONIDAL CYST / SINUS EXCISION    . PROSTATECTOMY    . tonsillectomy      FAMILY HISTORY: Family History  Problem Relation Age of Onset  . High blood pressure Mother   . Dementia Mother   . Diabetes Father     SOCIAL HISTORY: Social History   Social History  . Marital status: Married    Spouse name: [name]  . Number of children: 0  . Years of education: 12   Occupational History  . retired   .  Retired    Social History Main Topics  . Smoking status: Never Smoker  . Smokeless tobacco: Never Used  . Alcohol use No  . Drug use: No  . Sexual activity: Not on file   Other Topics Concern  . Not on file   Social History Narrative   Patient lives at home with [name]). Patient is disabled. Patient has 12 th grade education.   Right handed.   Caffeine- None     PHYSICAL EXAM  Vitals:   09/04/17 1205  BP: (!) 106/58  Pulse: 81  There is no height or weight on file to calculate BMI. General: alert, well developed, well nourished, in no acute distress Neck: supple no carotid bruits Respiratory: clear to auscultation bilaterally Cardiovascular: regular rate rhythm Skin: no rashes   Neurologic Exam  Mental Status: mild obese, in wheel chair Cranial Nerves: CN II-XII pupils were equal round reactive to light. Extraocular movements were full. Visual fields were full on confrontational test. Facial sensation and strength were normal. Hearing was intact to finger rubbing bilaterally. Uvula tongue were midline. Head turning and shoulder shrugging were normal and symmetric. Tongue protrusion into the cheeks strength were normal.  Motor: very strong upper extremity muscles, normal tone, mild to moderate spasticity of lower extremity, minimal hip flexion movement, mild bilateral knee contraction, ankle plantar flexion, no significant muscle spasticity noticed. Sensory: sensory level to T10 bilaterally. Coordination: good finger-to-nose, rapid repetitive alternating movements and finger apposition  Gait and Station: can not bear weight in wheelchair Reflexes: Deep tendon reflexes: Biceps: 2/2, Brachioradialis: 2/2, Triceps: 2/2, Pateller: 2+/2+, sustained bilateral ankle clonus    DIAGNOSTIC DATA (LABS, IMAGING, TESTING) - I reviewed patient records, labs, notes, testing and imaging myself where available.  Lab Results  Component Value Date   WBC 7.5 03/29/2017    HGB 15.9 03/29/2017   HCT 47.6 03/29/2017   MCV 89.0 03/29/2017   PLT 204.0 03/29/2017      Component Value Date/Time   NA 139 03/29/2017 1523   K 4.3 03/29/2017 1523   CL 105 03/29/2017 1523   CO2 29 03/29/2017 1523   GLUCOSE 83 03/29/2017 1523   BUN 15 03/29/2017 1523   CREATININE 0.89 03/29/2017 1523   CALCIUM 9.4 03/29/2017 1523   PROT 6.9 03/29/2017 1523   ALBUMIN 4.4 03/29/2017 1523   AST 19 03/29/2017 1523   ALT 16 03/29/2017 1523   ALKPHOS 82 03/29/2017 1523   BILITOT 0.6 03/29/2017 1523   GFRNONAA >60 10/06/2016 0114   GFRAA >60 10/06/2016 0114   Lab Results  Component Value Date   CHOL 138 03/29/2017   HDL 32.80 (L) 03/29/2017   LDLCALC 80 03/28/2016   LDLDIRECT 78.0 03/29/2017   TRIG 262.0 (H) 03/29/2017   CHOLHDL 4 03/29/2017    Lab Results  Component Value Date   TSH 2.54 03/29/2017   ASSESSMENT AND PLAN  71 y.o. year old male   Spastic paraplegia Thoracic spinal cord injury Continue Baclofen, tizanidine, clonazepam Refilled his prescription, Continue yearly checkup.  Marcial Pacas, M.D. Ph.D.  [facility] [place],  [address] Phone: [phone] Fax:      [phone]

## 2017-10-03 ENCOUNTER — Encounter: Payer: Self-pay | Admitting: Internal Medicine

## 2017-10-03 ENCOUNTER — Ambulatory Visit (INDEPENDENT_AMBULATORY_CARE_PROVIDER_SITE_OTHER): Payer: Medicare Other | Admitting: Internal Medicine

## 2017-10-03 VITALS — BP 134/82 | HR 74 | Temp 97.9°F | Ht 68.0 in

## 2017-10-03 DIAGNOSIS — E785 Hyperlipidemia, unspecified: Secondary | ICD-10-CM | POA: Diagnosis not present

## 2017-10-03 DIAGNOSIS — Z23 Encounter for immunization: Secondary | ICD-10-CM

## 2017-10-03 DIAGNOSIS — I10 Essential (primary) hypertension: Secondary | ICD-10-CM

## 2017-10-03 DIAGNOSIS — R739 Hyperglycemia, unspecified: Secondary | ICD-10-CM | POA: Diagnosis not present

## 2017-10-03 MED ORDER — FESOTERODINE FUMARATE ER 8 MG PO TB24: 8.0000 mg | ORAL_TABLET | Freq: Every day | ORAL | 3 refills | Status: DC

## 2017-10-03 NOTE — Assessment & Plan Note (Signed)
stable overall by history and exam, recent data reviewed with pt, and pt to continue medical treatment as before,  to f/u any worsening symptoms or concerns Lab Results  Component Value Date   HGBA1C 5.3 03/29/2017

## 2017-10-03 NOTE — Patient Instructions (Addendum)
You had the flu shot today  Options for the oxybutinin would be:  Vesicare, Detrol, and Toviaz - we sent a rx for David Kim to try  Please continue all other medications as before, and refills have been done if requested.  Please have the pharmacy call with any other refills you may need.  Please continue your efforts at being more active, low cholesterol diet, and weight control.  Please keep your appointments with your specialists as you may have planned  Please return in 6 months, or sooner if needed

## 2017-10-03 NOTE — Assessment & Plan Note (Signed)
stable overall by history and exam, recent data reviewed with pt, and pt to continue medical treatment as before,  to f/u any worsening symptoms or concerns Lab Results  Component Value Date   LDLCALC 80 03/28/2016

## 2017-10-03 NOTE — Assessment & Plan Note (Signed)
stable overall by history and exam, recent data reviewed with pt, and pt to continue medical treatment as before,  to f/u any worsening symptoms or concerns BP Readings from Last 3 Encounters:  10/03/17 134/82  09/04/17 (!) 106/58  06/04/17 125/80

## 2017-10-03 NOTE — Progress Notes (Signed)
Subjective:    Patient ID: David Kim, male    DOB: 31-Oct-1946, 71 y.o.   MRN: [id number]  HPI  Here to f/u; overall doing ok,  Pt denies chest pain, increasing sob or doe, wheezing, orthopnea, PND, increased LE swelling, palpitations, dizziness or syncope.  Pt denies new neurological symptoms such as new headache, or facial or extremity weakness or numbness.  Pt denies polydipsia, polyuria, or low sugar episode.  Pt states overall good compliance with meds, mostly trying to follow appropriate diet, with wt overall stable,  but little exercise however. No new complaints.  Declines colonoscopy Past Medical History:  Diagnosis Date  . Allergic rhinitis   . ALLERGIC RHINITIS 10/01/2007   Qualifier: Diagnosis of  By: Jenny Reichmann MD, Hunt Oris   . Anxiety   . Carpal tunnel syndrome    right  . Depression   . Depression   . Erectile dysfunction   . Fatigue   . GERD (gastroesophageal reflux disease)   . Gout   . Hypertension   . HYPOGONADISM 03/31/2008   Qualifier: Diagnosis of  By: Jenny Reichmann MD, Hunt Oris   . IBS (irritable bowel syndrome)   . Low back pain   . OSA (obstructive sleep apnea)   . Paraplegia ([place]) 04/23/2009   Qualifier: Diagnosis of  By: Jenny Reichmann MD, Hunt Oris   . Peptic ulcer disease   . PEPTIC ULCER DISEASE 10/01/2007   Qualifier: Diagnosis of  By: Jenny Reichmann MD, Hunt Oris   . Prostate cancer [facility])   . PROSTATE CANCER, HX OF 05/23/2007   Qualifier: Diagnosis of  By: Jenny Reichmann MD, [place] PARALYSIS 04/23/2009   Qualifier: Diagnosis of  By: Jenny Reichmann MD, Hunt Oris   . Thoracic spinal cord injury ([place]) 03/12/2012   Past Surgical History:  Procedure Laterality Date  . inguinal heniorrhaphy    . left leg surgery after fibula    . PILONIDAL CYST / SINUS EXCISION    . PROSTATECTOMY    . tonsillectomy      reports that  has never smoked. he has never used smokeless tobacco. He reports that he does not drink alcohol or use drugs. family history includes Dementia in his mother; Diabetes in his  father; High blood pressure in his mother. Allergies  Allergen Reactions  . Amoxicillin Other (See Comments)  . Endal Hd Other (See Comments)   Current Outpatient Medications on File Prior to Visit  Medication Sig Dispense Refill  . antiseptic oral rinse (BIOTENE) LIQD 15 mLs by Mouth Rinse route as needed for dry mouth.    Marland Kitchen aspirin (ECOTRIN LOW STRENGTH) 81 MG EC tablet Take 81 mg by mouth daily.      . baclofen (LIORESAL) 10 MG tablet Take 1 tablet (10 mg total) by mouth 4 (four) times daily. Taking additional 10mg  tablet (1/2 tab in 0500 and 1700). 150 each 11  . baclofen (LIORESAL) 20 MG tablet Take 1 tablet (20 mg total) by mouth 4 (four) times daily. 360 tablet 4  . CALCIUM-MAGNESIUM PO Take 1 tablet by mouth daily.     . clonazePAM (KLONOPIN) 2 MG tablet Take 2 tablets (4 mg total) by mouth at bedtime. 180 tablet 1  . losartan (COZAAR) 50 MG tablet Take 1 tablet (50 mg total) by mouth 2 (two) times daily. 180 tablet 3  . OVER THE COUNTER MEDICATION Take 22.5 mLs by mouth daily. Takes 1.5 tbsp  Mesotrace MV powder    . oxybutynin (DITROPAN) 5 MG  tablet Take 5 mg by mouth 4 (four) times daily.     Marland Kitchen tiZANidine (ZANAFLEX) 4 MG tablet take 1-2 tablets by mouth four times a day 620 tablet 4  . vitamin C (ASCORBIC ACID) 500 MG tablet Take 500 mg by mouth daily.    . vitamin E (VITAMIN E) 400 UNIT capsule Take 800 Units by mouth daily.     No current facility-administered medications on file prior to visit.    Review of Systems  Constitutional: Negative for other unusual diaphoresis or sweats HENT: Negative for ear discharge or swelling Eyes: Negative for other worsening visual disturbances Respiratory: Negative for stridor or other swelling  Gastrointestinal: Negative for worsening distension or other blood Genitourinary: Negative for retention or other urinary change Musculoskeletal: Negative for other MSK pain or swelling Skin: Negative for color change or other new  lesions Neurological: Negative for worsening tremors and other numbness  Psychiatric/Behavioral: Negative for worsening agitation or other fatigue All other system neg per pt    Objective:   Physical Exam BP 134/82   Pulse 74   Temp 97.9 F (36.6 C) (Oral)   Ht 5\' 8"  (1.727 m)   SpO2 98%   BMI 28.83 kg/m  VS noted, not ill appaering Constitutional: Pt appears in NAD HENT: Head: NCAT.  Right Ear: External ear normal.  Left Ear: External ear normal.  Eyes: . Pupils are equal, round, and reactive to light. Conjunctivae and EOM are normal Nose: without d/c or deformity Neck: Neck supple. Gross normal ROM Cardiovascular: Normal rate and regular rhythm.   Pulmonary/Chest: Effort normal and breath sounds without rales or wheezing.  Abd:  Soft, NT, ND, + BS, no organomegaly Neurological: Pt is alert. At baseline orientation, + paraplegic Skin: Skin is warm. No rashes, other new lesions, no LE edema Psychiatric: Pt behavior is normal without agitation  Lab Results  Component Value Date   WBC 7.5 03/29/2017   HGB 15.9 03/29/2017   HCT 47.6 03/29/2017   PLT 204.0 03/29/2017   GLUCOSE 83 03/29/2017   CHOL 138 03/29/2017   TRIG 262.0 (H) 03/29/2017   HDL 32.80 (L) 03/29/2017   LDLDIRECT 78.0 03/29/2017   LDLCALC 80 03/28/2016   ALT 16 03/29/2017   AST 19 03/29/2017   NA 139 03/29/2017   K 4.3 03/29/2017   CL 105 03/29/2017   CREATININE 0.89 03/29/2017   BUN 15 03/29/2017   CO2 29 03/29/2017   TSH 2.54 03/29/2017   PSA 0.04 (L) 04/04/2013   INR 0.99 10/06/2016   HGBA1C 5.3 03/29/2017       Assessment & Plan:

## 2017-10-08 ENCOUNTER — Encounter: Payer: Self-pay | Admitting: Internal Medicine

## 2017-10-08 ENCOUNTER — Ambulatory Visit (INDEPENDENT_AMBULATORY_CARE_PROVIDER_SITE_OTHER): Payer: Medicare Other | Admitting: Internal Medicine

## 2017-10-08 DIAGNOSIS — G4733 Obstructive sleep apnea (adult) (pediatric): Secondary | ICD-10-CM

## 2017-10-08 DIAGNOSIS — F5101 Primary insomnia: Secondary | ICD-10-CM | POA: Diagnosis not present

## 2017-10-08 NOTE — Patient Instructions (Signed)
We can continue CPAP 10, mask of choice, humidifier, supplies, AirView  Please call if we can help 

## 2017-10-08 NOTE — Progress Notes (Signed)
Subjective:    Patient ID: David Kim, male    DOB: [birth year], 71 y.o.   MRN: [id number]  HPI   male followed for OSA/Insomnia, complicated by paraplegia, GERD, HBP, GERD NPSG 2003, AHI 31 / hr   ----------------------------------------------------------------------------------------- 10/05/2016-71 year old male followed for OSA/Insomnia, complicated by paraplegia, GERD, HBP, GERD CPAP 10/ [facility] FOLLOWS FOR: Pt wears CPAP nightly. Denies problems with pressure setting. Pt states that his mask is too loose at times and he has to tighten it down very tight.  DME: [facility] Download shows good compliance 91%/4 hour, good control AHI 2.0/hour. We discussed mask fit and supplies. He can't get headgear quite often enough because of insurance, so the straps tend wear out-discussed. New concern-he fell and broke his right hip 3 months ago but it reset without requiring surgery. As a paraplegic, he sits most of the time. Ankles have been swelling R>L. he is only on BASA. Denies acute chest pains, syncope, sudden shortness of breath or family history of PE/DVT.  10/08/17- 71 year old male followed for OSA/Insomnia, complicated by paraplegia, GERD, HBP, GERD CPAP 10/ [facility] ---OSA: DME;[facility]. Pt wears CPAP nightly and DL attached. No new supplies needed at this time.  He feels he is doing well with CPAP.  Download confirms 87% compliance AHI 1.3/hour. We discussed what to do with power outages, backup batteries etc. He has lost several teeth due to dry mouth.  Familiar with Biotene.  Review of Systems   + = pos Constitutional: Negative for fever and unexpected weight change.  HENT: Negative for congestion, dental problem, ear pain, nosebleeds, postnasal drip, rhinorrhea, sinus pressure, sneezing, sore throat and trouble swallowing.   Eyes: Negative for redness and itching.  Respiratory: Negative for cough, chest tightness, shortness of breath and wheezing.   Cardiovascular: Negative for  palpitations, + leg swelling.  Gastrointestinal: Negative for nausea and vomiting.  Genitourinary: Negative for dysuria.  Musculoskeletal: Negative for joint swelling.  Skin: Negative for rash.  Neurological: Negative for headaches.  Hematological: Does not bruise/bleed easily.  Psychiatric/Behavioral: Negative for dysphoric mood. The patient is not nervous/anxious.      Objective:  OBJ- Physical Exam General- Alert, Oriented, Affect-appropriate, Distress- none acute, + Wheelchair, + obese Skin- rash-none, lesions- none, excoriation- none Lymphadenopathy- none Head- atraumatic            Eyes- Gross vision intact, PERRLA, conjunctivae and secretions clear            Ears- Hearing, canals-normal            Nose- Clear, no-Septal dev, mucus, polyps, erosion, perforation             Throat- Mallampati II , mucosa clear , drainage- none, tonsils- atrophic, + missing teeth                   neck- flexible , trachea midline, no stridor , thyroid nl, carotid no bruit Chest - symmetrical excursion , unlabored           Heart/CV- RRR , no murmur , no gallop  , no rub, nl s1 s2                           - JVD- none , edema-+2-3, R>L, stasis changes- none, varices- none           Lung- clear to P&A, wheeze- none, cough- none , dullness-none, rub- none  Chest wall-  Abd-  Br/ Gen/ Rectal- Not done, not indicated Extrem- cyanosis- none, clubbing, none, atrophy- none, strength- nl Neuro- +paraplegic      Assessment & Plan:

## 2017-10-10 NOTE — Assessment & Plan Note (Signed)
He is sedentary so it is easy for him to drift into naps during the day then have trouble sleeping at night-discussed sleep hygiene.

## 2017-10-10 NOTE — Assessment & Plan Note (Signed)
He feels better using CPAP and his wife confirms it prevents his snoring.  Download looks good.  I suggested he talk with his dentist about alternatives to Biotene for dry mouth.  He knows how to adjust his humidifier.

## 2017-10-18 ENCOUNTER — Telehealth: Payer: Self-pay | Admitting: Internal Medicine

## 2017-10-18 NOTE — Telephone Encounter (Signed)
Copied from [place] [id number]. Topic: General - Other >> Oct 18, 2017  3:08 PM Cecelia Byars, NT wrote: Reason for CRM: Patient says he needs an order for  a new toilet ,please fax request to [phone]/ [facility]

## 2017-10-18 NOTE — Telephone Encounter (Signed)
Does he mean a "riser" for the bathroom commode, or a bedside commode? thanks

## 2017-10-19 NOTE — Telephone Encounter (Signed)
Done

## 2017-10-19 NOTE — Telephone Encounter (Signed)
Done hardcopy to shirron 

## 2017-10-19 NOTE — Telephone Encounter (Signed)
Notified pt w/MD response he states he need a bedside commode...David Kim

## 2017-10-22 ENCOUNTER — Telehealth: Payer: Self-pay | Admitting: Internal Medicine

## 2017-10-22 NOTE — Telephone Encounter (Signed)
This has already been done. There is a phone encounter on 12/13 with that information.

## 2017-10-22 NOTE — Telephone Encounter (Signed)
Copied from [place] #[id number]. Topic: Quick Communication - See Telephone Encounter >> Oct 22, 2017  2:55 PM Cleaster Corin, NT wrote: CRM for notification. See Telephone encounter for:   10/22/17.Patient says he needs an order for  a new toilet ,please fax request to [phone]/ [facility] (beside commode with bench to go in bedroom)

## 2017-10-23 ENCOUNTER — Telehealth: Payer: Self-pay

## 2017-10-23 NOTE — Telephone Encounter (Signed)
Copied from [place] [id number]. Topic: General - Other >> Oct 23, 2017  2:42 PM Lolita Rieger, [place] wrote: Reason for CRM: pt needs new rx for bedside toilet was given the wrong on please call pt wife [phone]

## 2017-10-23 NOTE — Telephone Encounter (Signed)
Script needs to say "transfer bench with commode cutout". Please advise.

## 2017-10-24 NOTE — Telephone Encounter (Signed)
Done for the third time to [employer]

## 2017-12-11 ENCOUNTER — Other Ambulatory Visit: Payer: Self-pay | Admitting: Nurse Practitioner

## 2017-12-12 ENCOUNTER — Other Ambulatory Visit: Payer: Self-pay

## 2017-12-12 ENCOUNTER — Other Ambulatory Visit: Payer: Self-pay | Admitting: Neurology

## 2017-12-12 MED ORDER — BACLOFEN 20 MG PO TABS: 20.0000 mg | ORAL_TABLET | Freq: Four times a day (QID) | ORAL | 3 refills | Status: DC

## 2017-12-12 MED ORDER — BACLOFEN 10 MG PO TABS: 10.0000 mg | ORAL_TABLET | Freq: Four times a day (QID) | ORAL | 3 refills | Status: DC

## 2017-12-12 NOTE — Telephone Encounter (Addendum)
Called and spoke with nick. I verified with Dr. Krista Blue that patient should only be taking baclofen 20mg  tablet 4x/day. D/c 10mg  tablet on file.   We are going to contact pt/family to verify how he is taking medication also. He verbalized understanding.   After further investigation per Dr. Krista Blue last office note and speaking with patient "He is on both baclofen 20+10mg + 5mg  qid". Patient is requesting 90 day supply of both if possible to help save trips to the pharmacy. Advised we will contact his pharmacy to get this straightened out. He verbalized understanding and appreciation for call.

## 2017-12-12 NOTE — Telephone Encounter (Signed)
David Kim with [facility] is asking we send in a refill for baclofen (LIORESAL) 10 MG tablet

## 2017-12-12 NOTE — Telephone Encounter (Signed)
I called [facility] back and spoke with Janett Billow who transferred to [place]. Relayed that patient should be taking both baclofen 10mg  tablet and 20mg  tablet. Verified with patient and Dr. Krista Blue. Re-sent prescriptions to pharmacy for 90 day supply per patient request. He verbalized understanding and appreciation for call.

## 2018-03-02 ENCOUNTER — Other Ambulatory Visit: Payer: Self-pay | Admitting: Internal Medicine

## 2018-03-29 ENCOUNTER — Ambulatory Visit (INDEPENDENT_AMBULATORY_CARE_PROVIDER_SITE_OTHER): Payer: Medicare Other | Admitting: Internal Medicine

## 2018-03-29 ENCOUNTER — Encounter: Payer: Self-pay | Admitting: Internal Medicine

## 2018-03-29 ENCOUNTER — Other Ambulatory Visit (INDEPENDENT_AMBULATORY_CARE_PROVIDER_SITE_OTHER): Payer: Medicare Other

## 2018-03-29 VITALS — BP 130/70 | HR 88 | Temp 98.8°F | Ht 68.0 in | Wt 243.0 lb

## 2018-03-29 DIAGNOSIS — I10 Essential (primary) hypertension: Secondary | ICD-10-CM

## 2018-03-29 DIAGNOSIS — N320 Bladder-neck obstruction: Secondary | ICD-10-CM

## 2018-03-29 DIAGNOSIS — G8220 Paraplegia, unspecified: Secondary | ICD-10-CM | POA: Diagnosis not present

## 2018-03-29 DIAGNOSIS — R739 Hyperglycemia, unspecified: Secondary | ICD-10-CM

## 2018-03-29 DIAGNOSIS — E785 Hyperlipidemia, unspecified: Secondary | ICD-10-CM

## 2018-03-29 LAB — CBC WITH DIFFERENTIAL/PLATELET
BASOS ABS: 0.1 10*3/uL (ref 0.0–0.1)
Basophils Relative: 0.8 % (ref 0.0–3.0)
EOS PCT: 6.4 % — AB (ref 0.0–5.0)
Eosinophils Absolute: 0.6 10*3/uL (ref 0.0–0.7)
HEMATOCRIT: 42.7 % (ref 39.0–52.0)
Hemoglobin: 14.3 g/dL (ref 13.0–17.0)
LYMPHS ABS: 1.4 10*3/uL (ref 0.7–4.0)
LYMPHS PCT: 16.1 % (ref 12.0–46.0)
MCHC: 33.5 g/dL (ref 30.0–36.0)
MCV: 87.4 fl (ref 78.0–100.0)
Monocytes Absolute: 0.5 10*3/uL (ref 0.1–1.0)
Monocytes Relative: 6.1 % (ref 3.0–12.0)
Neutro Abs: 6.2 10*3/uL (ref 1.4–7.7)
Neutrophils Relative %: 70.6 % (ref 43.0–77.0)
Platelets: 302 10*3/uL (ref 150.0–400.0)
RBC: 4.89 Mil/uL (ref 4.22–5.81)
RDW: 15.3 % (ref 11.5–15.5)
WBC: 8.8 10*3/uL (ref 4.0–10.5)

## 2018-03-29 LAB — HEPATIC FUNCTION PANEL
ALBUMIN: 4.3 g/dL (ref 3.5–5.2)
ALK PHOS: 79 U/L (ref 39–117)
ALT: 16 U/L (ref 0–53)
AST: 19 U/L (ref 0–37)
Bilirubin, Direct: 0.1 mg/dL (ref 0.0–0.3)
TOTAL PROTEIN: 6.5 g/dL (ref 6.0–8.3)
Total Bilirubin: 0.6 mg/dL (ref 0.2–1.2)

## 2018-03-29 LAB — LIPID PANEL
Cholesterol: 136 mg/dL (ref 0–200)
HDL: 29.9 mg/dL — ABNORMAL LOW (ref >39.00)
NONHDL: 105.77
Total CHOL/HDL Ratio: 5
Triglycerides: 269 mg/dL — ABNORMAL HIGH (ref 0.0–149.0)
VLDL: 53.8 mg/dL — ABNORMAL HIGH (ref 0.0–40.0)

## 2018-03-29 LAB — BASIC METABOLIC PANEL
BUN: 18 mg/dL (ref 6–23)
CHLORIDE: 106 meq/L (ref 96–112)
CO2: 27 meq/L (ref 19–32)
CREATININE: 0.9 mg/dL (ref 0.40–1.50)
Calcium: 9.2 mg/dL (ref 8.4–10.5)
GFR: 88.63 mL/min (ref >60.00)
GLUCOSE: 109 mg/dL — AB (ref 70–99)
Potassium: 4 mEq/L (ref 3.5–5.1)
SODIUM: 140 meq/L (ref 135–145)

## 2018-03-29 LAB — HEMOGLOBIN A1C: Hgb A1c MFr Bld: 5.2 % (ref 4.6–6.5)

## 2018-03-29 LAB — LDL CHOLESTEROL, DIRECT: LDL DIRECT: 86 mg/dL

## 2018-03-29 LAB — PSA: PSA: 0.17 ng/mL (ref 0.10–4.00)

## 2018-03-29 LAB — TSH: TSH: 1.71 u[IU]/mL (ref 0.35–4.50)

## 2018-03-29 MED ORDER — ZOSTER VAC RECOMB ADJUVANTED 50 MCG/0.5ML IM SUSR: 0.5000 mL | Freq: Once | INTRAMUSCULAR | 1 refills | Status: AC

## 2018-03-29 MED ORDER — LOSARTAN POTASSIUM 50 MG PO TABS: 50.0000 mg | ORAL_TABLET | Freq: Two times a day (BID) | ORAL | 3 refills | Status: DC

## 2018-03-29 NOTE — Patient Instructions (Addendum)
Your shingles shot was sent to the pharmacy  Please continue all other medications as before, and refills have been done if requested.  Please have the pharmacy call with any other refills you may need.  Please continue your efforts at being more active, low cholesterol diet, and weight control.  You are otherwise up to date with prevention measures today.  Please keep your appointments with your specialists as you may have planned  Please go to the LAB in the Basement (turn left off the elevator) for the tests to be done today  You will be contacted by phone if any changes need to be made immediately.  Otherwise, you will receive a letter about your results with an explanation, but please check with MyChart first.  Please remember to sign up for MyChart if you have not done so, as this will be important to you in the future with finding out test results, communicating by private email, and scheduling acute appointments online when needed.  Please return in 6 months, or sooner if needed 

## 2018-03-29 NOTE — Progress Notes (Signed)
Subjective:    Patient ID: David Kim, male    DOB: 1946-09-09, 72 y.o.   MRN: [id number]  HPI  Here for yearly f/u;  Overall doing ok;  Pt denies Chest pain, worsening SOB, DOE, wheezing, orthopnea, PND, worsening LE edema, palpitations, dizziness or syncope.  Pt denies neurological change such as new headache, facial weakness.  Pt denies polydipsia, polyuria, or low sugar symptoms. Pt states overall good compliance with treatment and medications, good tolerability, and has been trying to follow appropriate diet.  Pt denies worsening depressive symptoms, suicidal ideation or panic. No fever, night sweats, wt loss, loss of appetite, or other constitutional symptoms.  Pt states good ability with ADL's, has high fall risk, home safety reviewed and adequate, no other significant changes in hearing or vision, No other interval hx or new complaint Past Medical History:  Diagnosis Date  . Allergic rhinitis   . ALLERGIC RHINITIS 10/01/2007   Qualifier: Diagnosis of  By: Jenny Reichmann MD, Hunt Oris   . Anxiety   . Carpal tunnel syndrome    right  . Depression   . Depression   . Erectile dysfunction   . Fatigue   . GERD (gastroesophageal reflux disease)   . Gout   . Hypertension   . HYPOGONADISM 03/31/2008   Qualifier: Diagnosis of  By: Jenny Reichmann MD, Hunt Oris   . IBS (irritable bowel syndrome)   . Low back pain   . OSA (obstructive sleep apnea)   . Paraplegia ([place]) 04/23/2009   Qualifier: Diagnosis of  By: Jenny Reichmann MD, Hunt Oris   . Peptic ulcer disease   . PEPTIC ULCER DISEASE 10/01/2007   Qualifier: Diagnosis of  By: Jenny Reichmann MD, Hunt Oris   . Prostate cancer [facility])   . PROSTATE CANCER, HX OF 05/23/2007   Qualifier: Diagnosis of  By: Jenny Reichmann MD, [place] PARALYSIS 04/23/2009   Qualifier: Diagnosis of  By: Jenny Reichmann MD, Hunt Oris   . Thoracic spinal cord injury ([place]) 03/12/2012   Past Surgical History:  Procedure Laterality Date  . inguinal heniorrhaphy    . left leg surgery after fibula    . PILONIDAL CYST /  SINUS EXCISION    . PROSTATECTOMY    . tonsillectomy      reports that he has never smoked. He has never used smokeless tobacco. He reports that he does not drink alcohol or use drugs. family history includes Dementia in his mother; Diabetes in his father; High blood pressure in his mother. Allergies  Allergen Reactions  . Amoxicillin Other (See Comments)  . Endal Hd Other (See Comments)   Current Outpatient Medications on File Prior to Visit  Medication Sig Dispense Refill  . antiseptic oral rinse (BIOTENE) LIQD 15 mLs by Mouth Rinse route as needed for dry mouth.    Marland Kitchen aspirin (ECOTRIN LOW STRENGTH) 81 MG EC tablet Take 81 mg by mouth daily.      . baclofen (LIORESAL) 10 MG tablet Take 1 tablet (10 mg total) by mouth 4 (four) times daily. Taking additional 10mg  tablet (1/2 tab in 0500 and 1700). 450 each 3  . baclofen (LIORESAL) 20 MG tablet Take 1 tablet (20 mg total) by mouth 4 (four) times daily. 360 tablet 3  . CALCIUM-MAGNESIUM PO Take 1 tablet by mouth daily.     . clonazePAM (KLONOPIN) 2 MG tablet Take 2 tablets (4 mg total) by mouth at bedtime. 180 tablet 1  . OVER THE COUNTER MEDICATION Take 22.5 mLs by  mouth daily. Takes 1.5 tbsp  Mesotrace MV powder    . oxybutynin (DITROPAN) 5 MG tablet Take 5 mg by mouth 4 (four) times daily.     Marland Kitchen tiZANidine (ZANAFLEX) 4 MG tablet take 1-2 tablets by mouth four times a day 620 tablet 4  . vitamin C (ASCORBIC ACID) 500 MG tablet Take 500 mg by mouth daily.    . vitamin E (VITAMIN E) 400 UNIT capsule Take 800 Units by mouth daily.     No current facility-administered medications on file prior to visit.    Review of Systems  Constitutional: Negative for other unusual diaphoresis or sweats HENT: Negative for ear discharge or swelling Eyes: Negative for other worsening visual disturbances Respiratory: Negative for stridor or other swelling  Gastrointestinal: Negative for worsening distension or other blood Genitourinary: Negative for  retention or other urinary change Musculoskeletal: Negative for other MSK pain or swelling Skin: Negative for color change or other new lesions Neurological: Negative for worsening tremors and other numbness  Psychiatric/Behavioral: Negative for worsening agitation or other fatigue All other system neg per pt    Objective:   Physical Exam BP 130/70 (BP Location: Left Arm, Patient Position: Sitting, Cuff Size: Normal)   Pulse 88   Temp 98.8 F (37.1 C) (Oral)   Ht 5\' 8"  (1.727 m)   Wt 243 lb (110.2 kg) Comment: With a Wheel Chair  SpO2 95%   BMI 36.95 kg/m  VS noted,  Constitutional: Pt appears in NAD HENT: Head: NCAT.  Right Ear: External ear normal.  Left Ear: External ear normal.  Eyes: . Pupils are equal, round, and reactive to light. Conjunctivae and EOM are normal Nose: without d/c or deformity Neck: Neck supple. Gross normal ROM Cardiovascular: Normal rate and regular rhythm.   Pulmonary/Chest: Effort normal and breath sounds without rales or wheezing.  Abd:  Soft, NT, ND, + BS, no organomegaly Neurological: Pt is alert. At baseline orientation, motor grossly intact to UE's Skin: Skin is warm. No rashes, other new lesions, no LE edema Psychiatric: Pt behavior is normal without agitation  No other exam findings     Assessment & Plan:

## 2018-03-29 NOTE — Assessment & Plan Note (Signed)
stable overall by history and exam, recent data reviewed with pt, and pt to continue medical treatment as before,  to f/u any worsening symptoms or concerns Lab Results  Component Value Date   HGBA1C 5.2 03/29/2018

## 2018-03-29 NOTE — Assessment & Plan Note (Signed)
stable overall by history and exam, recent data reviewed with pt, and pt to continue medical treatment as before,  to f/u any worsening symptoms or concerns Lab Results  Component Value Date   LDLCALC 80 03/28/2016

## 2018-03-29 NOTE — Assessment & Plan Note (Signed)
stable overall by history and exam, and pt to continue medical treatment as before,  to f/u any worsening symptoms or concerns 

## 2018-03-29 NOTE — Assessment & Plan Note (Signed)
stable overall by history and exam, recent data reviewed with pt, and pt to continue medical treatment as before,  to f/u any worsening symptoms or concerns BP Readings from Last 3 Encounters:  03/29/18 130/70  10/08/17 120/60  10/03/17 134/82

## 2018-04-02 ENCOUNTER — Ambulatory Visit: Payer: Medicare Other | Admitting: Internal Medicine

## 2018-04-02 DIAGNOSIS — L820 Inflamed seborrheic keratosis: Secondary | ICD-10-CM | POA: Diagnosis not present

## 2018-04-02 DIAGNOSIS — L905 Scar conditions and fibrosis of skin: Secondary | ICD-10-CM | POA: Diagnosis not present

## 2018-04-02 DIAGNOSIS — D1801 Hemangioma of skin and subcutaneous tissue: Secondary | ICD-10-CM | POA: Diagnosis not present

## 2018-04-02 DIAGNOSIS — L821 Other seborrheic keratosis: Secondary | ICD-10-CM | POA: Diagnosis not present

## 2018-04-02 DIAGNOSIS — L570 Actinic keratosis: Secondary | ICD-10-CM | POA: Diagnosis not present

## 2018-04-02 DIAGNOSIS — Z85828 Personal history of other malignant neoplasm of skin: Secondary | ICD-10-CM | POA: Diagnosis not present

## 2018-04-03 ENCOUNTER — Other Ambulatory Visit: Payer: Medicare Other

## 2018-04-03 ENCOUNTER — Telehealth: Payer: Self-pay

## 2018-04-03 NOTE — Telephone Encounter (Signed)
The lab spoke with this patient/wife about this.   Copied from [place] [id number]. Topic: Inquiry >> Apr 03, 2018 10:18 AM Scherrie Gerlach wrote: Reason for CRM: wife calling to advise she got a call that pt's UA was not done. Wife states she was told ok to bring in UA. Wife will be there in a little while to pick up cup. Wife also requesting if she can pick up 5-6 urine cups so that pt can bring in when pt comes for appts. Pt is paralyzed and this would be helpful.

## 2018-04-04 ENCOUNTER — Other Ambulatory Visit (INDEPENDENT_AMBULATORY_CARE_PROVIDER_SITE_OTHER): Payer: Medicare Other

## 2018-04-04 ENCOUNTER — Encounter: Payer: Self-pay | Admitting: Internal Medicine

## 2018-04-04 DIAGNOSIS — E785 Hyperlipidemia, unspecified: Secondary | ICD-10-CM

## 2018-04-04 LAB — URINALYSIS, ROUTINE W REFLEX MICROSCOPIC
BILIRUBIN URINE: NEGATIVE
Hgb urine dipstick: NEGATIVE
KETONES UR: NEGATIVE
LEUKOCYTES UA: NEGATIVE
NITRITE: NEGATIVE
RBC / HPF: NONE SEEN (ref 0–?)
Specific Gravity, Urine: <=1.005 — AB (ref 1.000–1.030)
Total Protein, Urine: NEGATIVE
URINE GLUCOSE: NEGATIVE
UROBILINOGEN UA: 0.2 (ref 0.0–1.0)
pH: 7 (ref 5.0–8.0)

## 2018-06-01 ENCOUNTER — Other Ambulatory Visit: Payer: Self-pay | Admitting: Internal Medicine

## 2018-06-05 ENCOUNTER — Encounter: Payer: Self-pay | Admitting: Physical Medicine & Rehabilitation

## 2018-06-05 ENCOUNTER — Encounter: Payer: Medicare Other | Attending: Physical Medicine & Rehabilitation | Admitting: Physical Medicine & Rehabilitation

## 2018-06-05 VITALS — BP 120/76 | HR 102

## 2018-06-05 DIAGNOSIS — G4733 Obstructive sleep apnea (adult) (pediatric): Secondary | ICD-10-CM | POA: Diagnosis not present

## 2018-06-05 DIAGNOSIS — S24103A Unspecified injury at T7-T10 level of thoracic spinal cord, initial encounter: Secondary | ICD-10-CM | POA: Insufficient documentation

## 2018-06-05 DIAGNOSIS — X58XXXA Exposure to other specified factors, initial encounter: Secondary | ICD-10-CM | POA: Diagnosis not present

## 2018-06-05 DIAGNOSIS — I10 Essential (primary) hypertension: Secondary | ICD-10-CM | POA: Diagnosis not present

## 2018-06-05 DIAGNOSIS — S24109S Unspecified injury at unspecified level of thoracic spinal cord, sequela: Secondary | ICD-10-CM

## 2018-06-05 DIAGNOSIS — Z8546 Personal history of malignant neoplasm of prostate: Secondary | ICD-10-CM | POA: Insufficient documentation

## 2018-06-05 DIAGNOSIS — G839 Paralytic syndrome, unspecified: Secondary | ICD-10-CM

## 2018-06-05 DIAGNOSIS — T63301A Toxic effect of unspecified spider venom, accidental (unintentional), initial encounter: Secondary | ICD-10-CM | POA: Insufficient documentation

## 2018-06-05 DIAGNOSIS — G8220 Paraplegia, unspecified: Secondary | ICD-10-CM

## 2018-06-05 DIAGNOSIS — G5601 Carpal tunnel syndrome, right upper limb: Secondary | ICD-10-CM | POA: Diagnosis not present

## 2018-06-05 MED ORDER — CLONAZEPAM 2 MG PO TABS: 4.0000 mg | ORAL_TABLET | Freq: Every day | ORAL | 2 refills | Status: DC

## 2018-06-05 NOTE — Progress Notes (Signed)
Subjective:    Patient ID: David Kim, male    DOB: July 26, 1946, 72 y.o.   MRN: [id number]  HPI   [name] is here in follow-up of his T10 spinal cord injury.  I last saw him a year ago.  He remains on tizanidine for also relaxation as well as Klonopin. He was able to decrease his tizanidine to 28mg  before the spasticity became more prominent.  He fractured his right intertrochanteric area last fall and was treated conservatively.  However, he has wife state that since then his spasticity increased in the left leg.  He saw neurology who had mentioned Botox to him.  He wanted to talk to me further about options.  He still works on his stretches at home but is more limited due to the increased tone.  He is no longer able to stand or use his standing frame either.   Currently his baclofen is 20 to 30 mg  4 times a day and tizanidine is 4 to 8 mg 4 times daily.    Pain Inventory Average Pain 0 Pain Right Now 0 My pain is no pain  In the last 24 hours, has pain interfered with the following? General activity 0 Relation with others 0 Enjoyment of life 0 What TIME of day is your pain at its worst? no pain Sleep (in general) Fair  Pain is worse with: no pain Pain improves with: no pain Relief from Meds: no pain  Mobility ability to climb steps?  no do you drive?  no use a wheelchair transfers alone  Function disabled: date disabled 02/12/09 I need assistance with the following:  bathing, toileting, meal prep, household duties and shopping  Neuro/Psych bladder control problems weakness numbness trouble walking spasms  Prior Studies Any changes since last visit?  no  Physicians involved in your care Primary care Dr. Cathlean Cower Neurologist Dr. Krista Blue Neurosurgeon Dr. Joya Salm Dr. Jenny Reichmann Wrenn-Urologist   Family History  Problem Relation Age of Onset  . High blood pressure Mother   . Dementia Mother   . Diabetes Father    Social History   Socioeconomic History  .  Marital status: Married    Spouse name: David Kim  . Number of children: 0  . Years of education: 40  . Highest education level: Not on file  Occupational History  . Occupation: retired    Fish farm manager: RETIRED  Social Needs  . Financial resource strain: Not on file  . Food insecurity:    Worry: Not on file    Inability: Not on file  . Transportation needs:    Medical: Not on file    Non-medical: Not on file  Tobacco Use  . Smoking status: Never Smoker  . Smokeless tobacco: Never Used  Substance and Sexual Activity  . Alcohol use: No  . Drug use: No  . Sexual activity: Not on file  Lifestyle  . Physical activity:    Days per week: Not on file    Minutes per session: Not on file  . Stress: Not on file  Relationships  . Social connections:    Talks on phone: Not on file    Gets together: Not on file    Attends religious service: Not on file    Active member of club or organization: Not on file    Attends meetings of clubs or organizations: Not on file    Relationship status: Not on file  Other Topics Concern  . Not on file  Social History  Narrative   Patient lives at home with his wife David Kim). Patient is disabled. Patient has 12 th grade education.   Right handed.   Caffeine- None   Past Surgical History:  Procedure Laterality Date  . inguinal heniorrhaphy    . left leg surgery after fibula    . PILONIDAL CYST / SINUS EXCISION    . PROSTATECTOMY    . tonsillectomy     Past Medical History:  Diagnosis Date  . Allergic rhinitis   . ALLERGIC RHINITIS 10/01/2007   Qualifier: Diagnosis of  By: Jenny Reichmann MD, Hunt Oris   . Anxiety   . Carpal tunnel syndrome    right  . Depression   . Depression   . Erectile dysfunction   . Fatigue   . GERD (gastroesophageal reflux disease)   . Gout   . Hypertension   . HYPOGONADISM 03/31/2008   Qualifier: Diagnosis of  By: Jenny Reichmann MD, Hunt Oris   . IBS (irritable bowel syndrome)   . Low back pain   . OSA (obstructive sleep apnea)   .  Paraplegia ([place]) 04/23/2009   Qualifier: Diagnosis of  By: Jenny Reichmann MD, Hunt Oris   . Peptic ulcer disease   . PEPTIC ULCER DISEASE 10/01/2007   Qualifier: Diagnosis of  By: Jenny Reichmann MD, Hunt Oris   . Prostate cancer [facility])   . PROSTATE CANCER, HX OF 05/23/2007   Qualifier: Diagnosis of  By: Jenny Reichmann MD, [place] PARALYSIS 04/23/2009   Qualifier: Diagnosis of  By: Jenny Reichmann MD, Hunt Oris   . Thoracic spinal cord injury ([place]) 03/12/2012   BP 120/76   Pulse (!) 102   SpO2 94%   Opioid Risk Score:   Fall Risk Score:  `1  Depression screen PHQ 2/9  Depression screen [facility] 2/9 03/29/2018 10/03/2017 03/29/2017 09/27/2016 09/27/2016 04/22/2015 04/15/2014  Decreased Interest 0 0 0 0 0 0 0  Down, Depressed, Hopeless 1 0 0 0 0 0 0  PHQ - 2 Score 1 0 0 0 0 0 0   Review of Systems  Constitutional: Negative.   HENT: Negative.   Eyes: Negative.   Cardiovascular:       Limb swelling  Gastrointestinal: Positive for constipation.  Genitourinary:       Urine retention  Musculoskeletal: Negative.   Allergic/Immunologic: Negative.   Neurological: Negative.   Hematological: Negative.   Psychiatric/Behavioral: Negative.   All other systems reviewed and are negative.      Objective:   Physical Exam  General: No acute distress HEENT: EOMI, oral membranes moist Cards: reg rate  Chest: normal effort Abdomen: Soft, NT, ND Skin: dry, intact Extremities: no edema.  Neurological:  No volitional movement in the legs except for trace hip control. No changes.He has no sensation below the level of injury. He still has 3-4/4 resting tone in left hamstrings, 2/4 gastroc, soleus. -30 contracture left knee in flexion. DTR's remain hyperactive. Oncgoing sustained clonus in both ankles with ROM . UE strength is normal.   Sitting posture is good. UE strength is 5/5.  Skin:   Psychiatric: He has a normal mood and affect. His behavior is normal. Judgment and thought content normal. tangential   Assessment & Plan:   ASSESSMENT:  1. 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 Tone has increased since last visit. 2. Hypertension.  3. Left thigh spider bite.    PLAN:   1. Continue with baclofen 30mg  4 x daily    continue tizanidine  at 28mg  daily.4-8mg  4x daily  -will set up for botox to left hamstrings 300 u  2. Continueklonopin.  Continue 2-4mg qhs for myoclonus/sleep. Refilled today 3. Discussed vitamin supplementation for bone density, standing frame/weight bearing. Needs to add calcium 4. ditropan 5. Consider re-imagining his thoracic spine given increased tone.   15 minutes of face to face patient care time were spent during this visit. All questions were encouraged and answered. Follow up in one month.

## 2018-06-05 NOTE — Patient Instructions (Signed)
PLEASE FEEL FREE TO CALL OUR OFFICE WITH ANY PROBLEMS OR QUESTIONS ([phone])      

## 2018-07-02 ENCOUNTER — Telehealth: Payer: Self-pay | Admitting: *Deleted

## 2018-07-02 NOTE — Telephone Encounter (Signed)
Mrs Firkus called to say David Kim is having teeth extracted today and she needs to know if it is ok for him to still have Botox tomorrow.  Per Dr Naaman Plummer it will be ok for him to keep appt tomorrow for Botox. Mrs  Roop notified.

## 2018-07-03 ENCOUNTER — Encounter: Payer: Medicare Other | Attending: Physical Medicine & Rehabilitation | Admitting: Physical Medicine & Rehabilitation

## 2018-07-03 ENCOUNTER — Encounter: Payer: Self-pay | Admitting: Physical Medicine & Rehabilitation

## 2018-07-03 VITALS — BP 106/69 | HR 74 | Ht 68.0 in | Wt 210.0 lb

## 2018-07-03 DIAGNOSIS — S64493A Injury of digital nerve of left middle finger, initial encounter: Secondary | ICD-10-CM | POA: Insufficient documentation

## 2018-07-03 DIAGNOSIS — I10 Essential (primary) hypertension: Secondary | ICD-10-CM | POA: Insufficient documentation

## 2018-07-03 DIAGNOSIS — T63301A Toxic effect of unspecified spider venom, accidental (unintentional), initial encounter: Secondary | ICD-10-CM | POA: Insufficient documentation

## 2018-07-03 DIAGNOSIS — S24103A Unspecified injury at T7-T10 level of thoracic spinal cord, initial encounter: Secondary | ICD-10-CM | POA: Diagnosis not present

## 2018-07-03 DIAGNOSIS — G4733 Obstructive sleep apnea (adult) (pediatric): Secondary | ICD-10-CM | POA: Diagnosis not present

## 2018-07-03 DIAGNOSIS — G8220 Paraplegia, unspecified: Secondary | ICD-10-CM | POA: Diagnosis not present

## 2018-07-03 DIAGNOSIS — G5601 Carpal tunnel syndrome, right upper limb: Secondary | ICD-10-CM | POA: Diagnosis not present

## 2018-07-03 DIAGNOSIS — G8114 Spastic hemiplegia affecting left nondominant side: Secondary | ICD-10-CM | POA: Insufficient documentation

## 2018-07-03 DIAGNOSIS — Z8546 Personal history of malignant neoplasm of prostate: Secondary | ICD-10-CM | POA: Diagnosis not present

## 2018-07-03 NOTE — Patient Instructions (Signed)
PLEASE FEEL FREE TO CALL OUR OFFICE WITH ANY PROBLEMS OR QUESTIONS ([phone])      

## 2018-07-03 NOTE — Progress Notes (Signed)
Botox Injection for spasticity using needle EMG guidance Indication: Paraplegia (HCC)  Spastic hemiparesis of left nondominant side (HCC)  Left lower ext  Dilution: 100 Units/ml        Total Units Injected: 300 Indication: Severe spasticity which interferes with ADL,mobility and/or  hygiene and is unresponsive to medication management and other conservative care Informed consent was obtained after describing risks and benefits of the procedure with the patient. This includes bleeding, bruising, infection, excessive weakness, or medication side effects. A REMS form is on file and signed.  Needle: 32mm injectable monopolar needle electrode  Number of units per muscle  Quadriceps 0 units Gastroc/soleus 0 units Hamstrings 300 units (100 u biceps femoris, 100u semimembranosus, 100u semitendinosus Tibialis Posterior 0 units EHL 0 units All injections were done after obtaining appropriate EMG activity and after negative drawback for blood. The patient tolerated the procedure well. Post procedure instructions were given. Return in about 3 months (around 10/03/2018).

## 2018-08-06 DIAGNOSIS — Z8546 Personal history of malignant neoplasm of prostate: Secondary | ICD-10-CM | POA: Diagnosis not present

## 2018-08-14 DIAGNOSIS — N200 Calculus of kidney: Secondary | ICD-10-CM | POA: Diagnosis not present

## 2018-08-14 DIAGNOSIS — N3946 Mixed incontinence: Secondary | ICD-10-CM | POA: Diagnosis not present

## 2018-08-14 DIAGNOSIS — C61 Malignant neoplasm of prostate: Secondary | ICD-10-CM | POA: Diagnosis not present

## 2018-08-14 DIAGNOSIS — N312 Flaccid neuropathic bladder, not elsewhere classified: Secondary | ICD-10-CM | POA: Diagnosis not present

## 2018-08-14 DIAGNOSIS — R9721 Rising PSA following treatment for malignant neoplasm of prostate: Secondary | ICD-10-CM | POA: Diagnosis not present

## 2018-09-04 NOTE — Progress Notes (Signed)
[facility]  PATIENT: David Kim DOB: Mar 12, 1946   REASON FOR VISIT:  Follow-up for paraplegia , due to T 10 cord injury, abnormality of gait HISTORY FROM: patient and wife    HISTORY OF PRESENT ILLNESS: HISTORY:David Kim came in to refill his medications, he is accompanied by his wife, sitting wheelchair David Kim developed weakness in his legs in 2009. He slipped on ice in January 2009 and fractures ankles requiring pinning procedures. Following that time he developed progressive leg weakness. 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 It became progressively less safe for him to do that. He began to experience difficulty getting out of a car.  He had MRI imaging studies of his head, cervical, and lumbar spine. 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 Following the procedure, the patient became paraplegic, a hematoma was found in the operative site when he was reopened. He did not regain his ability to move his legs which he had been able to do preoperatively.He is a sensory level below the umbilicus. He has neurogenic bowel and bladder and wears diapers all the time. Occasional UTI. He was evaluated by Dr. Gaynell Face, deemed not a baclofen pump candidate due to nonambulatory, no significant pain. The patient has spasticity in his lower extremities and his torso, but he is not experiencing pain when he has spasms. The spasticity does not awaken him at night time. His tone has not increased to the point where it is difficult to dress him, perform hygiene, or to place him in his wheelchair. He self transfer himself in and out of wheelchair. He is on both baclofen 20+62m+ 567mqid and tizanidine 32m36m tabs q6hours, clonazepam 2mg38m2 qam, one qhs. He complains of dry mouth, also taking oxybutynin, but no excessive drowsiness, wife has been stretching his legs on  daily basis. He has no significant bilateral lower extremity movement, sensory level from umbilical down no significant pain.  UPDATE Oct 8th 2015:He has lower extremity spasm, which is overall manageable recurrent baclofen 20 mg, 1 and half tablets 4 times a day, tizanidine as needed, clonazepam 2 mg every night Today he also reported episode of seeing spider webs in his visual field, with associated blurry vision, mild confusion, lasting 10-15 minutes, there was no associated headaches, which raised the possibility of possible partial seizure, vs. visual aura without migraine, He denies a previous history of migraine, this episode happened 3-4 times each year. He denied previous history of seizure  UPDATE 10/10/16CM David Kim y12r old male returns for follow up-he is a paraplegic from intramedullary tumor at T10 which was removed then he became paralyzed after the surgery. He has a neurogenic bowel and bladder. He continues to have significant spasticity in his lower extremities and his torso. He denies any pain with the spasms. He sleeps well during the night. He transfers himself out of his wheelchair with a transfer board. He is on both baclofen and tizanidine and clonazepam by [name], questionable seizure activity however EEG was normal and MRI of the brain did not show any acute process.. HeMarland Kitchenreturns for reevaluation UPDATE 10/30/2017CM David Kim y69r old male returns for follow-up. He is paraplegic from a intramedullary tumor at T10 which was removed however he became paralyzed after the surgery. He has neurogenic bowel and bladder. He continues to have spasticity of his  lower extremities however he denies any pain with his spasticity. He is on both baclofen and tizanidine and needs refills. He has not had further visual disturbance, EEG was normal. He had a spider bite 3 months ago to his left thigh and right hip fracture first of  October. Apparently with the fracture it  went back into place. He returns for reevaluation UPDATE Sep 04 2017:YY He complains of dry mouth, He dose bowel regime every morning,  He is now taking zanaflex 4mg  7 pills a day, balcofen 20mg  4 times a day, clonazepam 2mg  2 tabs qhs, oxybutynin 5mg  4 times a day.  UPDATE October 31, 2019CM David Kim, 72 year old male returns for follow-up with history of paraplegia from intramedullary tumor at T10.  He continues to have spasticity of the lower extremities however he denies any pain.  He has neurogenic bladder and bowel he is on baclofen and tizanidine.  He claims he has cataracts.  He is also had a right hip fracture but since he is not ambulatory nothing was done.  He transfers with a balance board.  He continues to see Dr. Tessa Lerner for Botox and he also feels his clonazepam.  He returns for reevaluation with no new concerns REVIEW OF SYSTEMS: Full 14 system review of systems performed and notable only for those listed, all others are neg:  Constitutional: neg  Cardiovascular: neg Ear/Nose/Throat: hearing loss Skin: neg Eyes: light sensitivity Respiratory: neg Gastroitestinal: neg  Genitourinary  bowel  program Hematology/Lymphatic: neg  Endocrine: neg Musculoskeletal: Paraplegia Allergy/Immunology: neg Neurological: Numbness  Psychiatric: neg Sleep : neg   ALLERGIES: Allergies  Allergen Reactions  . Amoxicillin Other (See Comments)  . Endal Hd Other (See Comments)    HOME MEDICATIONS: Outpatient Medications Prior to Visit  Medication Sig Dispense Refill  . antiseptic oral rinse (BIOTENE) LIQD 15 mLs by Mouth Rinse route as needed for dry mouth.    Marland Kitchen aspirin (ECOTRIN LOW STRENGTH) 81 MG EC tablet Take 81 mg by mouth daily.      Marland Kitchen CALCIUM-MAGNESIUM PO Take 1 tablet by mouth daily.     . clonazePAM (KLONOPIN) 2 MG tablet Take 2 tablets (4 mg total) by mouth at bedtime. 180 tablet 2  . losartan (COZAAR) 50 MG tablet TAKE 1 TABLET BY  MOUTH TWICE A DAY 180 tablet 1  . OVER THE COUNTER MEDICATION Take 22.5 mLs by mouth daily. Takes 1.5 tbsp  Mesotrace MV powder    . oxybutynin (DITROPAN) 5 MG tablet Take 5 mg by mouth 4 (four) times daily.     . vitamin C (ASCORBIC ACID) 500 MG tablet Take 500 mg by mouth daily.    . vitamin E (VITAMIN E) 400 UNIT capsule Take 800 Units by mouth daily.    . baclofen (LIORESAL) 10 MG tablet Take 1 tablet (10 mg total) by mouth 4 (four) times daily. Taking additional 10mg  tablet (1/2 tab in 0500 and 1700). 450 each 3  . baclofen (LIORESAL) 20 MG tablet Take 1 tablet (20 mg total) by mouth 4 (four) times daily. 360 tablet 3  . tiZANidine (ZANAFLEX) 4 MG tablet take 1-2 tablets by mouth four times a day 620 tablet 4  . losartan (COZAAR) 50 MG tablet Take 1 tablet (50 mg total) by mouth 2 (two) times daily. 180 tablet 3   No facility-administered medications prior to visit.     PAST MEDICAL HISTORY: Past Medical History:  Diagnosis Date  . Allergic rhinitis   . ALLERGIC RHINITIS 10/01/2007  Qualifier: Diagnosis of  By: Jenny Reichmann MD, Hunt Oris   . Anxiety   . Carpal tunnel syndrome    right  . Depression   . Depression   . Erectile dysfunction   . Fatigue   . GERD (gastroesophageal reflux disease)   . Gout   . Hypertension   . HYPOGONADISM 03/31/2008   Qualifier: Diagnosis of  By: Jenny Reichmann MD, Hunt Oris   . IBS (irritable bowel syndrome)   . Low back pain   . OSA (obstructive sleep apnea)   . Paraplegia ([place]) 04/23/2009   Qualifier: Diagnosis of  By: Jenny Reichmann MD, Hunt Oris   . Peptic ulcer disease   . PEPTIC ULCER DISEASE 10/01/2007   Qualifier: Diagnosis of  By: Jenny Reichmann MD, Hunt Oris   . Prostate cancer [facility])   . PROSTATE CANCER, HX OF 05/23/2007   Qualifier: Diagnosis of  By: Jenny Reichmann MD, [place] PARALYSIS 04/23/2009   Qualifier: Diagnosis of  By: Jenny Reichmann MD, Hunt Oris   . Thoracic spinal cord injury ([place]) 03/12/2012    PAST SURGICAL HISTORY: Past Surgical History:  Procedure Laterality Date    . inguinal heniorrhaphy    . left leg surgery after fibula    . PILONIDAL CYST / SINUS EXCISION    . PROSTATECTOMY    . tonsillectomy      FAMILY HISTORY: Family History  Problem Relation Age of Onset  . High blood pressure Mother   . Dementia Mother   . Diabetes Father     SOCIAL HISTORY: Social History   Socioeconomic History  . Marital status: Married    Spouse name: [name]  . Number of children: 0  . Years of education: 72  . Highest education level: Not on file  Occupational History  . Occupation: retired    Fish farm manager: RETIRED  Social Needs  . Financial resource strain: Not on file  . Food insecurity:    Worry: Not on file    Inability: Not on file  . Transportation needs:    Medical: Not on file    Non-medical: Not on file  Tobacco Use  . Smoking status: Never Smoker  . Smokeless tobacco: Never Used  Substance and Sexual Activity  . Alcohol use: No  . Drug use: No  . Sexual activity: Not on file  Lifestyle  . Physical activity:    Days per week: Not on file    Minutes per session: Not on file  . Stress: Not on file  Relationships  . Social connections:    Talks on phone: Not on file    Gets together: Not on file    Attends religious service: Not on file    Active member of club or organization: Not on file    Attends meetings of clubs or organizations: Not on file    Relationship status: Not on file  . Intimate partner violence:    Fear of current or ex partner: Not on file    Emotionally abused: Not on file    Physically abused: Not on file    Forced sexual activity: Not on file  Other Topics Concern  . Not on file  Social History Narrative   Patient lives at home with [name]). Patient is disabled. Patient has 12 th grade education.   Right handed.   Caffeine- None     PHYSICAL EXAM  Vitals:   09/05/18 1406  BP: 131/77  Pulse: 91  Height: 5\' 8"  (1.727 m)  Body mass index is 31.93 kg/m. General: alert, well developed,  well nourished, in no acute distress Neck: supple  Skin: no rashes   Neurologic Exam  Mental Status: mild obese, in wheel chair Cranial Nerves: CN II-XII pupils were equal round reactive to light. Extraocular movements were full. Visual fields were full on confrontational test. Facial sensation and strength were normal. Hearing was intact to finger rubbing bilaterally. Uvula tongue were midline. Head turning and shoulder shrugging were normal and symmetric. Tongue protrusion into the cheeks strength were normal.  Motor: very strong upper extremity muscles, normal tone, mild to moderate spasticity of lower extremity, minimal hip flexion movement, mild bilateral knee contraction, ankle plantar flexion. Sensory: sensory level to T10 bilaterally. Coordination: good finger-to-nose, rapid repetitive alternating movements and finger apposition  Gait and Station: can not bear weight in wheelchair Reflexes: Deep tendon reflexes: Biceps: 2/2, Brachioradialis: 2/2, Triceps: 2/2, Pateller: 2+/2+, sustained bilateral ankle clonus    DIAGNOSTIC DATA (LABS, IMAGING, TESTING) - I reviewed patient records, labs, notes, testing and imaging myself where available.  Lab Results  Component Value Date   WBC 8.8 03/29/2018   HGB 14.3 03/29/2018   HCT 42.7 03/29/2018   MCV 87.4 03/29/2018   PLT 302.0 03/29/2018      Component Value Date/Time   NA 140 03/29/2018 1456   K 4.0 03/29/2018 1456   CL 106 03/29/2018 1456   CO2 27 03/29/2018 1456   GLUCOSE 109 (H) 03/29/2018 1456   BUN 18 03/29/2018 1456   CREATININE 0.90 03/29/2018 1456   CALCIUM 9.2 03/29/2018 1456   PROT 6.5 03/29/2018 1456   ALBUMIN 4.3 03/29/2018 1456   AST 19 03/29/2018 1456   ALT 16 03/29/2018 1456   ALKPHOS 79 03/29/2018 1456   BILITOT 0.6 03/29/2018 1456   GFRNONAA >60 10/06/2016 0114   GFRAA >60 10/06/2016 0114   Lab Results  Component Value Date   CHOL 136 03/29/2018   HDL 29.90 (L) 03/29/2018   LDLCALC 80  03/28/2016   LDLDIRECT 86.0 03/29/2018   TRIG 269.0 (H) 03/29/2018   CHOLHDL 5 03/29/2018    Lab Results  Component Value Date   TSH 1.71 03/29/2018      ASSESSMENT AND PLAN  72 y.o. year old male  has a past medical history of  Paraplegia ([place]) (04/23/2009);  SPASTIC PARALYSIS (04/23/2009); Thoracic spinal cord injury ([place]) (03/12/2012); here to follow up.He is doing very well managing his spasticity.  He denies pain  Continue Baclofen at current dose will refill Continue tizanidine at current dose Continue clonazepam at current dose refilled by Dr. Tessa Lerner Continue Botox with Dr. Tessa Lerner  F/U yearly and prn  Dennie Bible, [facility], [facility], [facility] [address], [place], [place] [address] [phone]

## 2018-09-05 ENCOUNTER — Encounter: Payer: Self-pay | Admitting: Nurse Practitioner

## 2018-09-05 ENCOUNTER — Ambulatory Visit (INDEPENDENT_AMBULATORY_CARE_PROVIDER_SITE_OTHER): Payer: Medicare Other | Admitting: Nurse Practitioner

## 2018-09-05 VITALS — BP 131/77 | HR 91 | Ht 68.0 in

## 2018-09-05 DIAGNOSIS — S24109S Unspecified injury at unspecified level of thoracic spinal cord, sequela: Secondary | ICD-10-CM | POA: Diagnosis not present

## 2018-09-05 DIAGNOSIS — G8114 Spastic hemiplegia affecting left nondominant side: Secondary | ICD-10-CM

## 2018-09-05 DIAGNOSIS — G8220 Paraplegia, unspecified: Secondary | ICD-10-CM | POA: Diagnosis not present

## 2018-09-05 MED ORDER — TIZANIDINE HCL 4 MG PO TABS: ORAL_TABLET | ORAL | 3 refills | Status: DC

## 2018-09-05 MED ORDER — BACLOFEN 20 MG PO TABS: 20.0000 mg | ORAL_TABLET | Freq: Four times a day (QID) | ORAL | 3 refills | Status: DC

## 2018-09-05 MED ORDER — BACLOFEN 10 MG PO TABS: 10.0000 mg | ORAL_TABLET | Freq: Four times a day (QID) | ORAL | 3 refills | Status: DC

## 2018-09-05 NOTE — Patient Instructions (Signed)
Continue Baclofen at current dose will refill Continue tizanidine at current dose Continue clonazepam at current dose refilled by Dr. Tessa Lerner  F/U yearly and prn

## 2018-09-05 NOTE — Progress Notes (Signed)
I have reviewed and agreed above plan. 

## 2018-09-06 ENCOUNTER — Ambulatory Visit (HOSPITAL_COMMUNITY)
Admission: EM | Admit: 2018-09-06 | Discharge: 2018-09-07 | Disposition: A | Discharge status: Home / Self Care | Payer: Medicare Other | Attending: Emergency Medicine | Admitting: Emergency Medicine

## 2018-09-06 ENCOUNTER — Other Ambulatory Visit: Payer: Self-pay

## 2018-09-06 ENCOUNTER — Emergency Department (HOSPITAL_COMMUNITY): Payer: Medicare Other | Admitting: Certified Registered"

## 2018-09-06 ENCOUNTER — Encounter (HOSPITAL_COMMUNITY)
Admission: EM | Disposition: A | Discharge status: Home / Self Care | Payer: Self-pay | Source: Home / Self Care | Attending: Emergency Medicine

## 2018-09-06 ENCOUNTER — Emergency Department (HOSPITAL_COMMUNITY): Payer: Medicare Other

## 2018-09-06 ENCOUNTER — Encounter (HOSPITAL_COMMUNITY): Payer: Self-pay

## 2018-09-06 DIAGNOSIS — K589 Irritable bowel syndrome without diarrhea: Secondary | ICD-10-CM | POA: Insufficient documentation

## 2018-09-06 DIAGNOSIS — S66123A Laceration of flexor muscle, fascia and tendon of left middle finger at wrist and hand level, initial encounter: Secondary | ICD-10-CM | POA: Diagnosis not present

## 2018-09-06 DIAGNOSIS — S62623B Displaced fracture of medial phalanx of left middle finger, initial encounter for open fracture: Secondary | ICD-10-CM | POA: Insufficient documentation

## 2018-09-06 DIAGNOSIS — K219 Gastro-esophageal reflux disease without esophagitis: Secondary | ICD-10-CM | POA: Insufficient documentation

## 2018-09-06 DIAGNOSIS — S65503A Unspecified injury of blood vessel of left middle finger, initial encounter: Secondary | ICD-10-CM | POA: Diagnosis not present

## 2018-09-06 DIAGNOSIS — G4733 Obstructive sleep apnea (adult) (pediatric): Secondary | ICD-10-CM | POA: Diagnosis not present

## 2018-09-06 DIAGNOSIS — Z8546 Personal history of malignant neoplasm of prostate: Secondary | ICD-10-CM | POA: Diagnosis not present

## 2018-09-06 DIAGNOSIS — W312XXA Contact with powered woodworking and forming machines, initial encounter: Secondary | ICD-10-CM | POA: Diagnosis not present

## 2018-09-06 DIAGNOSIS — Y929 Unspecified place or not applicable: Secondary | ICD-10-CM | POA: Insufficient documentation

## 2018-09-06 DIAGNOSIS — R58 Hemorrhage, not elsewhere classified: Secondary | ICD-10-CM | POA: Diagnosis not present

## 2018-09-06 DIAGNOSIS — I10 Essential (primary) hypertension: Secondary | ICD-10-CM | POA: Diagnosis not present

## 2018-09-06 DIAGNOSIS — G8220 Paraplegia, unspecified: Secondary | ICD-10-CM | POA: Diagnosis not present

## 2018-09-06 DIAGNOSIS — M109 Gout, unspecified: Secondary | ICD-10-CM | POA: Diagnosis not present

## 2018-09-06 DIAGNOSIS — Z79899 Other long term (current) drug therapy: Secondary | ICD-10-CM | POA: Insufficient documentation

## 2018-09-06 DIAGNOSIS — F419 Anxiety disorder, unspecified: Secondary | ICD-10-CM | POA: Diagnosis not present

## 2018-09-06 DIAGNOSIS — S62625A Displaced fracture of medial phalanx of left ring finger, initial encounter for closed fracture: Secondary | ICD-10-CM | POA: Diagnosis not present

## 2018-09-06 DIAGNOSIS — S61213A Laceration without foreign body of left middle finger without damage to nail, initial encounter: Secondary | ICD-10-CM | POA: Diagnosis not present

## 2018-09-06 DIAGNOSIS — F329 Major depressive disorder, single episode, unspecified: Secondary | ICD-10-CM | POA: Insufficient documentation

## 2018-09-06 DIAGNOSIS — S61203A Unspecified open wound of left middle finger without damage to nail, initial encounter: Secondary | ICD-10-CM | POA: Diagnosis not present

## 2018-09-06 DIAGNOSIS — R52 Pain, unspecified: Secondary | ICD-10-CM | POA: Diagnosis not present

## 2018-09-06 HISTORY — PX: NERVE REPAIR: SHX2083

## 2018-09-06 HISTORY — PX: WOUND EXPLORATION: SHX6188

## 2018-09-06 HISTORY — PX: ARTERY AND TENDON REPAIR: SHX5696

## 2018-09-06 HISTORY — PX: OPEN REDUCTION INTERNAL FIXATION (ORIF) DISTAL PHALANX: SHX6236

## 2018-09-06 HISTORY — PX: CAST APPLICATION: SHX380

## 2018-09-06 LAB — BASIC METABOLIC PANEL
ANION GAP: 8 (ref 5–15)
BUN: 15 mg/dL (ref 8–23)
CALCIUM: 8.7 mg/dL — AB (ref 8.9–10.3)
CO2: 20 mmol/L — AB (ref 22–32)
CREATININE: 0.97 mg/dL (ref 0.61–1.24)
Chloride: 108 mmol/L (ref 98–111)
GFR calc Af Amer: >60 mL/min (ref >60)
GFR calc non Af Amer: >60 mL/min (ref >60)
Glucose, Bld: 127 mg/dL — ABNORMAL HIGH (ref 70–99)
Potassium: 3.9 mmol/L (ref 3.5–5.1)
Sodium: 136 mmol/L (ref 135–145)

## 2018-09-06 LAB — CBC WITH DIFFERENTIAL/PLATELET
Abs Immature Granulocytes: 0.1 10*3/uL — ABNORMAL HIGH (ref 0.00–0.07)
Basophils Absolute: 0.1 10*3/uL (ref 0.0–0.1)
Basophils Relative: 1 %
EOS ABS: 0.1 10*3/uL (ref 0.0–0.5)
EOS PCT: 1 %
HEMATOCRIT: 48.8 % (ref 39.0–52.0)
Hemoglobin: 15.3 g/dL (ref 13.0–17.0)
IMMATURE GRANULOCYTES: 1 %
LYMPHS ABS: 1.2 10*3/uL (ref 0.7–4.0)
Lymphocytes Relative: 10 %
MCH: 28.3 pg (ref 26.0–34.0)
MCHC: 31.4 g/dL (ref 30.0–36.0)
MCV: 90.4 fL (ref 80.0–100.0)
MONOS PCT: 8 %
Monocytes Absolute: 1 10*3/uL (ref 0.1–1.0)
Neutro Abs: 9.3 10*3/uL — ABNORMAL HIGH (ref 1.7–7.7)
Neutrophils Relative %: 79 %
Platelets: 202 10*3/uL (ref 150–400)
RBC: 5.4 MIL/uL (ref 4.22–5.81)
RDW: 14.8 % (ref 11.5–15.5)
WBC: 11.7 10*3/uL — ABNORMAL HIGH (ref 4.0–10.5)
nRBC: 0 % (ref 0.0–0.2)

## 2018-09-06 SURGERY — WOUND EXPLORATION
Anesthesia: General | Site: Hand | Laterality: Left

## 2018-09-06 MED ORDER — ROCURONIUM BROMIDE 100 MG/10ML IV SOLN
INTRAVENOUS | Status: DC | PRN
  Administered 2018-09-06: 30 mg via INTRAVENOUS
  Administered 2018-09-07 (×2): 10 mg via INTRAVENOUS

## 2018-09-06 MED ORDER — ONDANSETRON HCL 4 MG/2ML IJ SOLN: 4.0000 mg | Freq: Once | INTRAMUSCULAR | Status: DC | PRN

## 2018-09-06 MED ORDER — ONDANSETRON HCL 4 MG/2ML IJ SOLN
INTRAMUSCULAR | Status: DC | PRN
  Administered 2018-09-06: 4 mg via INTRAVENOUS

## 2018-09-06 MED ORDER — FENTANYL CITRATE (PF) 250 MCG/5ML IJ SOLN
INTRAMUSCULAR | Status: DC | PRN
  Administered 2018-09-06: 50 ug via INTRAVENOUS
  Administered 2018-09-06: 150 ug via INTRAVENOUS
  Administered 2018-09-07: 50 ug via INTRAVENOUS

## 2018-09-06 MED ORDER — PROPOFOL 10 MG/ML IV BOLUS
INTRAVENOUS | Status: DC | PRN
  Administered 2018-09-06: 50 mg via INTRAVENOUS
  Administered 2018-09-06: 150 mg via INTRAVENOUS

## 2018-09-06 MED ORDER — LACTATED RINGERS IV SOLN
INTRAVENOUS | Status: DC | PRN
  Administered 2018-09-06 (×2): via INTRAVENOUS

## 2018-09-06 MED ORDER — CEFAZOLIN SODIUM-DEXTROSE 2-4 GM/100ML-% IV SOLN
2.0000 g | Freq: Once | INTRAVENOUS | Status: AC
  Administered 2018-09-06: 2 g via INTRAVENOUS
  Filled 2018-09-06: qty 100

## 2018-09-06 MED ORDER — PROPOFOL 10 MG/ML IV BOLUS
INTRAVENOUS | Status: AC
  Filled 2018-09-06: qty 20

## 2018-09-06 MED ORDER — TETANUS-DIPHTH-ACELL PERTUSSIS 5-2.5-18.5 LF-MCG/0.5 IM SUSP
0.5000 mL | Freq: Once | INTRAMUSCULAR | Status: AC
  Administered 2018-09-06: 0.5 mL via INTRAMUSCULAR
  Filled 2018-09-06: qty 0.5

## 2018-09-06 MED ORDER — LIDOCAINE HCL (CARDIAC) PF 100 MG/5ML IV SOSY
PREFILLED_SYRINGE | INTRAVENOUS | Status: DC | PRN
  Administered 2018-09-06: 80 mg via INTRAVENOUS

## 2018-09-06 MED ORDER — SUCCINYLCHOLINE CHLORIDE 200 MG/10ML IV SOSY
PREFILLED_SYRINGE | INTRAVENOUS | Status: AC
  Filled 2018-09-06: qty 10

## 2018-09-06 MED ORDER — FENTANYL CITRATE (PF) 250 MCG/5ML IJ SOLN
INTRAMUSCULAR | Status: AC
  Filled 2018-09-06: qty 5

## 2018-09-06 MED ORDER — PHENYLEPHRINE HCL 10 MG/ML IJ SOLN
INTRAMUSCULAR | Status: DC | PRN
  Administered 2018-09-06: 80 ug via INTRAVENOUS
  Administered 2018-09-06: 160 ug via INTRAVENOUS
  Administered 2018-09-06: 120 ug via INTRAVENOUS
  Administered 2018-09-06 – 2018-09-07 (×2): 80 ug via INTRAVENOUS
  Administered 2018-09-07: 160 ug via INTRAVENOUS
  Administered 2018-09-07: 80 ug via INTRAVENOUS

## 2018-09-06 MED ORDER — LIDOCAINE HCL (PF) 1 % IJ SOLN
INTRAMUSCULAR | Status: AC
  Filled 2018-09-06: qty 30

## 2018-09-06 MED ORDER — FENTANYL CITRATE (PF) 100 MCG/2ML IJ SOLN: 25.0000 ug | INTRAMUSCULAR | Status: DC | PRN

## 2018-09-06 MED ORDER — LIDOCAINE 2% (20 MG/ML) 5 ML SYRINGE
INTRAMUSCULAR | Status: AC
  Filled 2018-09-06: qty 5

## 2018-09-06 MED ORDER — 0.9 % SODIUM CHLORIDE (POUR BTL) OPTIME
TOPICAL | Status: DC | PRN
  Administered 2018-09-06: 1000 mL

## 2018-09-06 MED ORDER — DEXAMETHASONE SODIUM PHOSPHATE 10 MG/ML IJ SOLN
INTRAMUSCULAR | Status: DC | PRN
  Administered 2018-09-06: 10 mg via INTRAVENOUS

## 2018-09-06 SURGICAL SUPPLY — 44 items
BANDAGE ELASTIC 4 VELCRO ST LF (GAUZE/BANDAGES/DRESSINGS) ×4 IMPLANT
BNDG CMPR 9X4 STRL LF SNTH (GAUZE/BANDAGES/DRESSINGS) ×2
BNDG ESMARK 4X9 LF (GAUZE/BANDAGES/DRESSINGS) ×4 IMPLANT
BNDG GAUZE ELAST 4 BULKY (GAUZE/BANDAGES/DRESSINGS) ×4 IMPLANT
CORDS BIPOLAR (ELECTRODE) ×4 IMPLANT
COVER SURGICAL LIGHT HANDLE (MISCELLANEOUS) ×4 IMPLANT
COVER WAND RF STERILE (DRAPES) ×4 IMPLANT
CUFF TOURNIQUET SINGLE 18IN (TOURNIQUET CUFF) ×4 IMPLANT
DRAPE MICROSCOPE LEICA (MISCELLANEOUS) ×4 IMPLANT
DRAPE SURG 17X23 STRL (DRAPES) ×4 IMPLANT
GAUZE SPONGE 4X4 12PLY STRL (GAUZE/BANDAGES/DRESSINGS) ×4 IMPLANT
GAUZE XEROFORM 1X8 LF (GAUZE/BANDAGES/DRESSINGS) ×4 IMPLANT
GLOVE BIO SURGEON STRL SZ7 (GLOVE) ×4 IMPLANT
GLOVE BIO SURGEON STRL SZ8 (GLOVE) ×8 IMPLANT
GLOVE BIOGEL PI IND STRL 6 (GLOVE) ×2 IMPLANT
GLOVE BIOGEL PI IND STRL 7.0 (GLOVE) ×2 IMPLANT
GLOVE BIOGEL PI INDICATOR 6 (GLOVE) ×2
GLOVE BIOGEL PI INDICATOR 7.0 (GLOVE) ×2
GLOVE SURG SYN 8.0 (GLOVE) ×4 IMPLANT
GOWN STRL REUS W/ TWL LRG LVL3 (GOWN DISPOSABLE) ×2 IMPLANT
GOWN STRL REUS W/ TWL XL LVL3 (GOWN DISPOSABLE) ×2 IMPLANT
GOWN STRL REUS W/TWL LRG LVL3 (GOWN DISPOSABLE) ×4
GOWN STRL REUS W/TWL XL LVL3 (GOWN DISPOSABLE) ×4
K-WIRE 0.9X102 (Wire) ×4 IMPLANT
KIT BASIN OR (CUSTOM PROCEDURE TRAY) ×4 IMPLANT
KIT TURNOVER KIT B (KITS) ×4 IMPLANT
KWIRE 0.9X102 (Wire) ×2 IMPLANT
MANIFOLD NEPTUNE II (INSTRUMENTS) ×4 IMPLANT
NEEDLE HYPO 25GX1X1/2 BEV (NEEDLE) ×4 IMPLANT
NS IRRIG 1000ML POUR BTL (IV SOLUTION) ×4 IMPLANT
PACK ORTHO EXTREMITY (CUSTOM PROCEDURE TRAY) ×4 IMPLANT
PAD ARMBOARD 7.5X6 YLW CONV (MISCELLANEOUS) ×4 IMPLANT
PAD CAST 3X4 CTTN HI CHSV (CAST SUPPLIES) ×2 IMPLANT
PADDING CAST COTTON 3X4 STRL (CAST SUPPLIES) ×4
SCRUB POVIDONE IODINE 4 OZ (MISCELLANEOUS) ×4 IMPLANT
SLING ARM FOAM STRAP XLG (SOFTGOODS) ×4 IMPLANT
SOLUTION BETADINE 4OZ (MISCELLANEOUS) ×4 IMPLANT
SPEAR EYE SURG WECK-CEL (MISCELLANEOUS) ×8 IMPLANT
SUT ETHIBOND 3 0 SH 1 (SUTURE) ×12 IMPLANT
SUT ETHILON 4 0 PS 2 18 (SUTURE) ×8 IMPLANT
SUT ETHILON 9 0 BV130 4 (SUTURE) ×4 IMPLANT
SUT PROLENE 6 0 P 1 18 (SUTURE) ×4 IMPLANT
TOWEL GREEN STERILE (TOWEL DISPOSABLE) ×4 IMPLANT
UNDERPAD 30X30 (UNDERPADS AND DIAPERS) ×4 IMPLANT

## 2018-09-06 NOTE — ED Triage Notes (Signed)
Per ems pt was doing wood work  And cut left hand middle finger with saw. Received 74mcg fentanyl and 343ml NS en route. Pt is paraplegic

## 2018-09-06 NOTE — Anesthesia Procedure Notes (Signed)
Procedure Name: Intubation Date/Time: 09/06/2018 11:19 PM Performed by: Shirlyn Goltz, CRNA Pre-anesthesia Checklist: Patient identified, Emergency Drugs available, Suction available and Patient being monitored Patient Re-evaluated:Patient Re-evaluated prior to induction Oxygen Delivery Method: Circle system utilized Preoxygenation: Pre-oxygenation with 100% oxygen Induction Type: IV induction Ventilation: Mask ventilation without difficulty and Oral airway inserted - appropriate to patient size Laryngoscope Size: Glidescope and 4 Grade View: Grade I Tube type: Oral Tube size: 7.5 mm Number of attempts: 1 Airway Equipment and Method: Video-laryngoscopy and Rigid stylet Placement Confirmation: ETT inserted through vocal cords under direct vision,  positive ETCO2 and breath sounds checked- equal and bilateral Secured at: 21 cm Tube secured with: Tape Dental Injury: Teeth and Oropharynx as per pre-operative assessment  Comments: Elective glidescope; patient's wife states she has a letter from an anesthesiologist from his previous surgery stating he is a difficult intubation
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
## 2018-09-06 NOTE — Anesthesia Preprocedure Evaluation (Addendum)
Anesthesia Evaluation  Patient identified by MRN, date of birth, ID band Patient awake    Reviewed: Allergy & Precautions, NPO status , Patient's Chart, lab work & pertinent test results  History of Anesthesia Complications (+) DIFFICULT AIRWAY and history of anesthetic complications  Airway Mallampati: IV  TM Distance: >3 FB Neck ROM: Full    Dental no notable dental hx.    Pulmonary sleep apnea and Continuous Positive Airway Pressure Ventilation ,    Pulmonary exam normal breath sounds clear to auscultation       Cardiovascular hypertension, Pt. on medications Normal cardiovascular exam Rhythm:Regular Rate:Normal     Neuro/Psych PSYCHIATRIC DISORDERS Anxiety Depression Spine tumor    GI/Hepatic Neg liver ROS, PUD, IBS (irritable bowel syndrome)   Endo/Other  negative endocrine ROS  Renal/GU negative Renal ROS     Musculoskeletal Low back pain Paraplegia    Abdominal   Peds  Hematology Gout   Anesthesia Other Findings LEFT LONG FINGER COMPLEX INJURY  Reproductive/Obstetrics                            Anesthesia Physical Anesthesia Plan  ASA: III and emergent  Anesthesia Plan: General   Post-op Pain Management:    Induction: Intravenous  PONV Risk Score and Plan: 2 and Ondansetron, Dexamethasone and Treatment may vary due to age or medical condition  Airway Management Planned: Oral ETT and Video Laryngoscope Planned  Additional Equipment:   Intra-op Plan:   Post-operative Plan: Extubation in OR  Informed Consent: I have reviewed the patients History and Physical, chart, labs and discussed the procedure including the risks, benefits and alternatives for the proposed anesthesia with the patient or authorized representative who has indicated his/her understanding and acceptance.   Dental advisory given  Plan Discussed with: CRNA  Anesthesia Plan Comments:        Anesthesia Quick Evaluation

## 2018-09-06 NOTE — Consult Note (Signed)
Reason for Consult:left long finger complex laceration Referring Physician: [name] is an 72 y.o. male.  HPI: patient's very pleasant 72 year old right-hand-dominant male status post saw injury to left long finger with complex palmar lacerationwith open fracture middle phalanx and decreased flexion and decreased sensation distally.  Past Medical History:  Diagnosis Date  . Allergic rhinitis   . ALLERGIC RHINITIS 10/01/2007   Qualifier: Diagnosis of  By: Jenny Reichmann MD, Hunt Oris   . Anxiety   . Carpal tunnel syndrome    right  . Depression   . Depression   . Erectile dysfunction   . Fatigue   . GERD (gastroesophageal reflux disease)   . Gout   . Hypertension   . HYPOGONADISM 03/31/2008   Qualifier: Diagnosis of  By: Jenny Reichmann MD, Hunt Oris   . IBS (irritable bowel syndrome)   . Low back pain   . OSA (obstructive sleep apnea)   . Paraplegia ([place]) 04/23/2009   Qualifier: Diagnosis of  By: Jenny Reichmann MD, Hunt Oris   . Peptic ulcer disease   . PEPTIC ULCER DISEASE 10/01/2007   Qualifier: Diagnosis of  By: Jenny Reichmann MD, Hunt Oris   . Prostate cancer [facility])   . PROSTATE CANCER, HX OF 05/23/2007   Qualifier: Diagnosis of  By: Jenny Reichmann MD, [place] PARALYSIS 04/23/2009   Qualifier: Diagnosis of  By: Jenny Reichmann MD, Hunt Oris   . Thoracic spinal cord injury ([place]) 03/12/2012    Past Surgical History:  Procedure Laterality Date  . inguinal heniorrhaphy    . left leg surgery after fibula    . PILONIDAL CYST / SINUS EXCISION    . PROSTATECTOMY    . tonsillectomy      Family History  Problem Relation Age of Onset  . High blood pressure Mother   . Dementia Mother   . Diabetes Father     Social History:  reports that he has never smoked. He has never used smokeless tobacco. He reports that he does not drink alcohol or use drugs.  Allergies:  Allergies  Allergen Reactions  . Amoxicillin Other (See Comments)  . Endal Hd Other (See Comments)    Medications: Prior to Admission:  (Not in a  hospital admission) Scheduled:   Results for orders placed or performed during the hospital encounter of 09/06/18 (from the past 48 hour(s))  CBC with Differential     Status: Abnormal   Collection Time: 09/06/18  7:59 PM  Result Value Ref Range   WBC 11.7 (H) 4.0 - 10.5 K/uL   RBC 5.40 4.22 - 5.81 MIL/uL   Hemoglobin 15.3 13.0 - 17.0 g/dL   HCT 48.8 39.0 - 52.0 %   MCV 90.4 80.0 - 100.0 fL   MCH 28.3 26.0 - 34.0 pg   MCHC 31.4 30.0 - 36.0 g/dL   RDW 14.8 11.5 - 15.5 %   Platelets 202 150 - 400 K/uL   nRBC 0.0 0.0 - 0.2 %   Neutrophils Relative % 79 %   Neutro Abs 9.3 (H) 1.7 - 7.7 K/uL   Lymphocytes Relative 10 %   Lymphs Abs 1.2 0.7 - 4.0 K/uL   Monocytes Relative 8 %   Monocytes Absolute 1.0 0.1 - 1.0 K/uL   Eosinophils Relative 1 %   Eosinophils Absolute 0.1 0.0 - 0.5 K/uL   Basophils Relative 1 %   Basophils Absolute 0.1 0.0 - 0.1 K/uL   Immature Granulocytes 1 %   Abs Immature Granulocytes 0.10 (H)  0.00 - 0.07 K/uL    Comment: Performed at [facility], [place] [address], [place], [place] [address]    Dg Hand Complete Left  Result Date: 09/06/2018 CLINICAL DATA:  Cut third digit with saw, initial encounter EXAM: LEFT HAND - COMPLETE 3+ VIEW COMPARISON:  None. FINDINGS: There is a comminuted fracture of the third middle phalanx with significant posterior angulation of the distal fracture fragments at the fracture site. Multiple small bony fragments are noted adjacent to the fracture consistent with the recent injury. No other focal abnormality is noted. IMPRESSION: Third middle phalangeal fracture as described. Electronically Signed   By: Inez Catalina M.D.   On: 09/06/2018 20:18    Review of Systems  All other systems reviewed and are negative.  Blood pressure 139/81, pulse 94, temperature 99.2 F (37.3 C), temperature source Oral, height 5\' 8"  (1.727 m), weight 90.7 kg, SpO2 97 %. Physical Exam  Constitutional: He is oriented to person, place, and time. He appears  well-developed and well-nourished.  HENT:  Head: Normocephalic and atraumatic.  Neck: Normal range of motion.  Cardiovascular: Normal rate.  Musculoskeletal:       Left hand: He exhibits decreased range of motion, disruption of two-point discrimination, deformity and laceration.  Left long finger volar complex laceration at middle phalangeal level with open fracture and probable tendon, artery, and nerve injury.  Neurological: He is alert and oriented to person, place, and time.  Skin: Skin is warm.  Psychiatric: He has a normal mood and affect. His behavior is normal. Judgment and thought content normal.    Assessment/Plan: 72 year old right-hand-dominant male with complex left long finger laceration with open fracture and probable tendon, artery, and nerve injury. I discussed in great detailwith the patient and his wife the nature of his injury and treatment options. Recommend expiration repair vital structures left long finger as soon as possible. Patient understands the risks and benefits and wishes to proceed.  Sheral Apley [facility] 09/06/2018, 8:49 PM

## 2018-09-07 ENCOUNTER — Other Ambulatory Visit: Payer: Self-pay | Admitting: Orthopedic Surgery

## 2018-09-07 DIAGNOSIS — S66123A Laceration of flexor muscle, fascia and tendon of left middle finger at wrist and hand level, initial encounter: Secondary | ICD-10-CM | POA: Diagnosis not present

## 2018-09-07 DIAGNOSIS — S61213A Laceration without foreign body of left middle finger without damage to nail, initial encounter: Secondary | ICD-10-CM | POA: Diagnosis not present

## 2018-09-07 DIAGNOSIS — S62623B Displaced fracture of medial phalanx of left middle finger, initial encounter for open fracture: Secondary | ICD-10-CM | POA: Diagnosis not present

## 2018-09-07 MED ORDER — CEFAZOLIN SODIUM-DEXTROSE 2-3 GM-%(50ML) IV SOLR
INTRAVENOUS | Status: DC | PRN
  Administered 2018-09-07: 2 g via INTRAVENOUS

## 2018-09-07 MED ORDER — OXYCODONE-ACETAMINOPHEN 5-325 MG PO TABS: 1.0000 | ORAL_TABLET | Freq: Four times a day (QID) | ORAL | Status: DC | PRN

## 2018-09-07 MED ORDER — BUPIVACAINE HCL (PF) 0.25 % IJ SOLN
INTRAMUSCULAR | Status: DC | PRN
  Administered 2018-09-07: 8 mL

## 2018-09-07 MED ORDER — PROPOFOL 10 MG/ML IV BOLUS
INTRAVENOUS | Status: AC
  Filled 2018-09-07: qty 20

## 2018-09-07 MED ORDER — FENTANYL CITRATE (PF) 100 MCG/2ML IJ SOLN
INTRAMUSCULAR | Status: AC
  Filled 2018-09-07: qty 2

## 2018-09-07 MED ORDER — BUPIVACAINE HCL (PF) 0.25 % IJ SOLN
INTRAMUSCULAR | Status: AC
  Filled 2018-09-07: qty 30

## 2018-09-07 MED ORDER — SUGAMMADEX SODIUM 200 MG/2ML IV SOLN
INTRAVENOUS | Status: DC | PRN
  Administered 2018-09-07: 200 mg via INTRAVENOUS

## 2018-09-07 NOTE — Anesthesia Postprocedure Evaluation (Signed)
Anesthesia Post Note  Patient: David Kim  Procedure(s) Performed: EXPLORATION OFCOMPLEX INJURY (Left Finger) CAST APPLICATION (Left Hand) OPEN REDUCTION INTERNAL FIXATION (ORIF) LEFT LONG FINGER (Left Finger) ARTERY AND TENDON REPAIR (Left Finger) NERVE REPAIR TIMES TWO (Left Finger)     Patient location during evaluation: PACU Anesthesia Type: General Level of consciousness: awake Pain management: pain level controlled Vital Signs Assessment: post-procedure vital signs reviewed and stable Respiratory status: spontaneous breathing, nonlabored ventilation, respiratory function stable and patient connected to nasal cannula oxygen Cardiovascular status: blood pressure returned to baseline and stable Postop Assessment: no apparent nausea or vomiting Anesthetic complications: no    Last Vitals:  Vitals:   09/07/18 0215 09/07/18 0230  BP: (!) 146/78 (!) 146/78  Pulse: 95 92  Resp: 18 13  Temp: 36.9 C   SpO2: 95% 93%    Last Pain:  Vitals:   09/07/18 0215  TempSrc:   PainSc: 0-No pain                 Calla Wedekind P Javonne Louissaint

## 2018-09-07 NOTE — Op Note (Signed)
Please see dictated report [phone]

## 2018-09-07 NOTE — Op Note (Signed)
NAME: David Kim, David Kim MEDICAL RECORD [id number] ACCOUNT [id number] DATE OF BIRTH:08-03-1946 FACILITY: [facility] LOCATION: [facility] PHYSICIAN:Dawsen Krieger A. Burney Gauze, MD  OPERATIVE REPORT  DATE OF PROCEDURE:  09/07/2018  PREOPERATIVE DIAGNOSES:  Complex open wound, left long finger, with tendon, artery, nerve and bone injury.  POSTOPERATIVE DIAGNOSES:  Complex open wound, left long finger, with tendon, artery, nerve and bone injury.  PROCEDURE: 1.  Exploration of above with open reduction internal fixation open middle phalanx fracture, left long finger. 2.  Primary repair of zone 2 flexor digitorum profundus tendon. 3.  Microscopic repair of ulnar digital artery and nerve and radial digital nerve.  SURGEON:  Charlotte Crumb, MD  ASSISTANT:  None.  ANESTHESIA:  General.  COMPLICATIONS:  None.  DRAINS:  None.  DESCRIPTION OF PROCEDURE:  The patient was taken to the operating suite after induction of adequate general anesthetic.  The left upper extremity was prepped and draped in the usual sterile fashion.  An Esmarch was used to exsanguinate the limb, and the  tourniquet was inflated to 250 mmHg.  At this point in time, the left long finger was approached surgically.  It took 500 mL and irrigated out the wound under direct and fluoroscopic guidance.  We fixed the displaced middle phalangeal fracture from the  table saw injury with a 0.035 K wire that was driven from distal to proximal across the fracture site and into the PIP joint in slight flexion with the DIP and PIP joints.  Fluoroscopic imaging confirmed adequate placement of the hardware and good  reduction of the fracture.  We then performed a primary repair of the flexor digitorum profundus tendon in zone 2 just proximal to the A4 pulley using 3-0 Ethibond and a Tajima-type suture followed by 6-0 Prolene epitendinous-type stitch.  After this was  done, the microscope was brought onto the field, and under microscopic magnification,  we repaired the ulnar digital artery and nerve and the radial digital nerve using 9-0 nylon.  There was also a small laceration of the left index finger poly that we  repaired with 4-0 nylon.  We then dressed with Xeroform, 4 x 4's and a dorsal extension block splint.  The patient tolerated these procedures well and went to recovery room in stable fashion.  LN/NUANCE  D:09/07/2018 T:09/07/2018 JOB:[id number]

## 2018-09-07 NOTE — Transfer of Care (Signed)
Immediate Anesthesia Transfer of Care Note  Patient: David Kim  Procedure(s) Performed: EXPLORATION OFCOMPLEX INJURY (Left Finger) CAST APPLICATION (Left Hand) OPEN REDUCTION INTERNAL FIXATION (ORIF) LEFT LONG FINGER (Left Finger) ARTERY AND TENDON REPAIR (Left Finger) NERVE REPAIR TIMES TWO (Left Finger)  Patient Location: PACU  Anesthesia Type:General  Level of Consciousness: responds to stimulation  Airway & Oxygen Therapy: Patient Spontanous Breathing and Patient connected to face mask oxygen  Post-op Assessment: Report given to RN and Post -op Vital signs reviewed and stable  Post vital signs: Reviewed and stable  Last Vitals:  Vitals Value Taken Time  BP    Temp    Pulse 91 09/07/2018  1:35 AM  Resp 16 09/07/2018  1:35 AM  SpO2 97 % 09/07/2018  1:35 AM  Vitals shown include unvalidated device data.  Last Pain:  Vitals:   09/06/18 2045  TempSrc:   PainSc: 0-No pain         Complications: No apparent anesthesia complications

## 2018-09-09 ENCOUNTER — Encounter (HOSPITAL_COMMUNITY): Payer: Self-pay | Admitting: Orthopedic Surgery

## 2018-09-10 DIAGNOSIS — R208 Other disturbances of skin sensation: Secondary | ICD-10-CM | POA: Diagnosis not present

## 2018-09-10 DIAGNOSIS — S65519A Laceration of blood vessel of unspecified finger, initial encounter: Secondary | ICD-10-CM | POA: Insufficient documentation

## 2018-09-10 DIAGNOSIS — S62623B Displaced fracture of medial phalanx of left middle finger, initial encounter for open fracture: Secondary | ICD-10-CM | POA: Diagnosis not present

## 2018-09-10 DIAGNOSIS — S61209A Unspecified open wound of unspecified finger without damage to nail, initial encounter: Secondary | ICD-10-CM | POA: Diagnosis not present

## 2018-09-10 DIAGNOSIS — M25642 Stiffness of left hand, not elsewhere classified: Secondary | ICD-10-CM | POA: Diagnosis not present

## 2018-09-10 DIAGNOSIS — S56129A Laceration of flexor muscle, fascia and tendon of unspecified finger at forearm level, initial encounter: Secondary | ICD-10-CM | POA: Diagnosis not present

## 2018-09-10 DIAGNOSIS — S64493A Injury of digital nerve of left middle finger, initial encounter: Secondary | ICD-10-CM | POA: Diagnosis not present

## 2018-09-10 DIAGNOSIS — M79645 Pain in left finger(s): Secondary | ICD-10-CM | POA: Diagnosis not present

## 2018-09-30 ENCOUNTER — Ambulatory Visit: Payer: Medicare Other | Admitting: Internal Medicine

## 2018-10-02 ENCOUNTER — Ambulatory Visit: Payer: Medicare Other | Admitting: Internal Medicine

## 2018-10-07 ENCOUNTER — Ambulatory Visit: Payer: Medicare Other | Admitting: Physical Medicine & Rehabilitation

## 2018-10-10 DIAGNOSIS — S62623B Displaced fracture of medial phalanx of left middle finger, initial encounter for open fracture: Secondary | ICD-10-CM | POA: Diagnosis not present

## 2018-10-10 DIAGNOSIS — S61209A Unspecified open wound of unspecified finger without damage to nail, initial encounter: Secondary | ICD-10-CM | POA: Diagnosis not present

## 2018-10-10 DIAGNOSIS — S64493A Injury of digital nerve of left middle finger, initial encounter: Secondary | ICD-10-CM | POA: Diagnosis not present

## 2018-10-10 DIAGNOSIS — M25642 Stiffness of left hand, not elsewhere classified: Secondary | ICD-10-CM | POA: Diagnosis not present

## 2018-10-10 DIAGNOSIS — S56129A Laceration of flexor muscle, fascia and tendon of unspecified finger at forearm level, initial encounter: Secondary | ICD-10-CM | POA: Diagnosis not present

## 2018-10-10 DIAGNOSIS — R208 Other disturbances of skin sensation: Secondary | ICD-10-CM | POA: Diagnosis not present

## 2018-10-20 ENCOUNTER — Other Ambulatory Visit: Payer: Self-pay | Admitting: Neurology

## 2018-10-31 DIAGNOSIS — S62623B Displaced fracture of medial phalanx of left middle finger, initial encounter for open fracture: Secondary | ICD-10-CM | POA: Diagnosis not present

## 2018-11-05 DIAGNOSIS — M25642 Stiffness of left hand, not elsewhere classified: Secondary | ICD-10-CM | POA: Diagnosis not present

## 2018-11-05 DIAGNOSIS — R208 Other disturbances of skin sensation: Secondary | ICD-10-CM | POA: Diagnosis not present

## 2018-11-07 ENCOUNTER — Telehealth: Payer: Self-pay

## 2018-11-07 DIAGNOSIS — S24109S Unspecified injury at unspecified level of thoracic spinal cord, sequela: Secondary | ICD-10-CM

## 2018-11-07 DIAGNOSIS — G839 Paralytic syndrome, unspecified: Secondary | ICD-10-CM

## 2018-11-07 MED ORDER — CLONAZEPAM 2 MG PO TABS: 4.0000 mg | ORAL_TABLET | Freq: Every day | ORAL | 2 refills | Status: DC

## 2018-11-07 NOTE — Telephone Encounter (Signed)
Patient called in for a refill of Clonazepam medication.  Not been seen in clinic since august and was told to follow up in 3 months, next appointment is for march.  Is it ok to refill this patients medication for 2 Tabs qhs

## 2018-11-08 ENCOUNTER — Telehealth: Payer: Self-pay

## 2018-11-08 NOTE — Telephone Encounter (Signed)
Pt called stating that the pharmacy will not release his Clonzepam prescription. I called pharmacy and pt last filled a 3 month supply on 09-12-18 and next fill date is 12-12-2018. I called pt back to let him know why. He states he thinks he spilled some due to a hand injury he had. He thinks he has enough to get him through January. He states he will count his medication and call back if he needs a early refill.

## 2018-11-11 DIAGNOSIS — S61209A Unspecified open wound of unspecified finger without damage to nail, initial encounter: Secondary | ICD-10-CM | POA: Diagnosis not present

## 2018-11-11 DIAGNOSIS — M25642 Stiffness of left hand, not elsewhere classified: Secondary | ICD-10-CM | POA: Diagnosis not present

## 2018-11-11 DIAGNOSIS — S56129A Laceration of flexor muscle, fascia and tendon of unspecified finger at forearm level, initial encounter: Secondary | ICD-10-CM | POA: Diagnosis not present

## 2018-11-11 DIAGNOSIS — M79645 Pain in left finger(s): Secondary | ICD-10-CM | POA: Diagnosis not present

## 2018-11-11 DIAGNOSIS — S62623B Displaced fracture of medial phalanx of left middle finger, initial encounter for open fracture: Secondary | ICD-10-CM | POA: Diagnosis not present

## 2018-11-11 DIAGNOSIS — S65519A Laceration of blood vessel of unspecified finger, initial encounter: Secondary | ICD-10-CM | POA: Diagnosis not present

## 2018-11-11 DIAGNOSIS — R208 Other disturbances of skin sensation: Secondary | ICD-10-CM | POA: Diagnosis not present

## 2018-11-11 DIAGNOSIS — S64493A Injury of digital nerve of left middle finger, initial encounter: Secondary | ICD-10-CM | POA: Diagnosis not present

## 2018-11-19 DIAGNOSIS — S64493A Injury of digital nerve of left middle finger, initial encounter: Secondary | ICD-10-CM | POA: Diagnosis not present

## 2018-11-19 DIAGNOSIS — M25642 Stiffness of left hand, not elsewhere classified: Secondary | ICD-10-CM | POA: Diagnosis not present

## 2018-11-19 DIAGNOSIS — S62623B Displaced fracture of medial phalanx of left middle finger, initial encounter for open fracture: Secondary | ICD-10-CM | POA: Diagnosis not present

## 2018-11-19 DIAGNOSIS — R208 Other disturbances of skin sensation: Secondary | ICD-10-CM | POA: Diagnosis not present

## 2018-11-19 DIAGNOSIS — S61209A Unspecified open wound of unspecified finger without damage to nail, initial encounter: Secondary | ICD-10-CM | POA: Diagnosis not present

## 2018-11-19 DIAGNOSIS — M79645 Pain in left finger(s): Secondary | ICD-10-CM | POA: Diagnosis not present

## 2018-11-19 DIAGNOSIS — S56129A Laceration of flexor muscle, fascia and tendon of unspecified finger at forearm level, initial encounter: Secondary | ICD-10-CM | POA: Diagnosis not present

## 2018-11-28 DIAGNOSIS — S65519A Laceration of blood vessel of unspecified finger, initial encounter: Secondary | ICD-10-CM | POA: Diagnosis not present

## 2018-11-28 DIAGNOSIS — S64493A Injury of digital nerve of left middle finger, initial encounter: Secondary | ICD-10-CM | POA: Diagnosis not present

## 2018-11-28 DIAGNOSIS — S56129A Laceration of flexor muscle, fascia and tendon of unspecified finger at forearm level, initial encounter: Secondary | ICD-10-CM | POA: Diagnosis not present

## 2018-11-28 DIAGNOSIS — S62623B Displaced fracture of medial phalanx of left middle finger, initial encounter for open fracture: Secondary | ICD-10-CM | POA: Diagnosis not present

## 2018-11-28 DIAGNOSIS — S61209A Unspecified open wound of unspecified finger without damage to nail, initial encounter: Secondary | ICD-10-CM | POA: Diagnosis not present

## 2018-12-03 ENCOUNTER — Telehealth: Payer: Self-pay | Admitting: *Deleted

## 2018-12-03 DIAGNOSIS — G839 Paralytic syndrome, unspecified: Secondary | ICD-10-CM

## 2018-12-03 DIAGNOSIS — S24109S Unspecified injury at unspecified level of thoracic spinal cord, sequela: Secondary | ICD-10-CM

## 2018-12-03 MED ORDER — CLONAZEPAM 2 MG PO TABS: 4.0000 mg | ORAL_TABLET | Freq: Every day | ORAL | 2 refills | Status: DC

## 2018-12-03 NOTE — Telephone Encounter (Signed)
Patient is calling to let us know that he needs a refill on his clonazepam.  I contacted patients pharmacy and they say he is eligible for a refill on February 5th.  I contacted patient and he says he is [place] run out the 1st of February.  I asked why he is gonna run out early and he says he dropped some on the floor and was unable to recover them (?).  I told him I would have to get permission from Dr. Naaman Plummer for early refill.Marland KitchenMarland KitchenMarland KitchenPlease advise

## 2018-12-03 NOTE — Telephone Encounter (Signed)
Medicine refilled. 

## 2018-12-04 NOTE — Telephone Encounter (Signed)
Do you give permission for an early refill.  Patient states that he dropped some pills on the floor and was not able to recover them.  He is going to run out early on February 1st.  He is not eligible for a refill until February 5th

## 2018-12-26 ENCOUNTER — Ambulatory Visit: Payer: Medicare Other | Admitting: Internal Medicine

## 2019-01-01 ENCOUNTER — Encounter: Payer: Self-pay | Admitting: Internal Medicine

## 2019-01-01 ENCOUNTER — Ambulatory Visit (INDEPENDENT_AMBULATORY_CARE_PROVIDER_SITE_OTHER): Payer: Medicare Other | Admitting: Internal Medicine

## 2019-01-01 ENCOUNTER — Other Ambulatory Visit (INDEPENDENT_AMBULATORY_CARE_PROVIDER_SITE_OTHER): Payer: Medicare Other

## 2019-01-01 VITALS — BP 116/64 | HR 87 | Temp 98.4°F | Ht 68.0 in

## 2019-01-01 DIAGNOSIS — R739 Hyperglycemia, unspecified: Secondary | ICD-10-CM

## 2019-01-01 DIAGNOSIS — E538 Deficiency of other specified B group vitamins: Secondary | ICD-10-CM

## 2019-01-01 DIAGNOSIS — E559 Vitamin D deficiency, unspecified: Secondary | ICD-10-CM

## 2019-01-01 DIAGNOSIS — Z8546 Personal history of malignant neoplasm of prostate: Secondary | ICD-10-CM | POA: Diagnosis not present

## 2019-01-01 DIAGNOSIS — E785 Hyperlipidemia, unspecified: Secondary | ICD-10-CM | POA: Diagnosis not present

## 2019-01-01 DIAGNOSIS — I10 Essential (primary) hypertension: Secondary | ICD-10-CM | POA: Diagnosis not present

## 2019-01-01 LAB — HEPATIC FUNCTION PANEL
ALT: 16 U/L (ref 0–53)
AST: 20 U/L (ref 0–37)
Albumin: 4.3 g/dL (ref 3.5–5.2)
Alkaline Phosphatase: 77 U/L (ref 39–117)
Bilirubin, Direct: 0.2 mg/dL (ref 0.0–0.3)
Total Bilirubin: 0.8 mg/dL (ref 0.2–1.2)
Total Protein: 6.8 g/dL (ref 6.0–8.3)

## 2019-01-01 LAB — CBC WITH DIFFERENTIAL/PLATELET
Basophils Absolute: 0.1 10*3/uL (ref 0.0–0.1)
Basophils Relative: 0.6 % (ref 0.0–3.0)
EOS ABS: 0.6 10*3/uL (ref 0.0–0.7)
Eosinophils Relative: 5.5 % — ABNORMAL HIGH (ref 0.0–5.0)
HCT: 45.8 % (ref 39.0–52.0)
Hemoglobin: 15.7 g/dL (ref 13.0–17.0)
LYMPHS ABS: 1.7 10*3/uL (ref 0.7–4.0)
Lymphocytes Relative: 16.6 % (ref 12.0–46.0)
MCHC: 34.3 g/dL (ref 30.0–36.0)
MCV: 85.4 fl (ref 78.0–100.0)
Monocytes Absolute: 0.6 10*3/uL (ref 0.1–1.0)
Monocytes Relative: 6 % (ref 3.0–12.0)
NEUTROS PCT: 71.3 % (ref 43.0–77.0)
Neutro Abs: 7.4 10*3/uL (ref 1.4–7.7)
Platelets: 247 10*3/uL (ref 150.0–400.0)
RBC: 5.36 Mil/uL (ref 4.22–5.81)
RDW: 15.1 % (ref 11.5–15.5)
WBC: 10.4 10*3/uL (ref 4.0–10.5)

## 2019-01-01 LAB — BASIC METABOLIC PANEL
BUN: 18 mg/dL (ref 6–23)
CO2: 26 meq/L (ref 19–32)
Calcium: 9.2 mg/dL (ref 8.4–10.5)
Chloride: 104 mEq/L (ref 96–112)
Creatinine, Ser: 0.69 mg/dL (ref 0.40–1.50)
GFR: 113.06 mL/min (ref >60.00)
Glucose, Bld: 85 mg/dL (ref 70–99)
Potassium: 3.8 mEq/L (ref 3.5–5.1)
Sodium: 138 mEq/L (ref 135–145)

## 2019-01-01 LAB — URINALYSIS, ROUTINE W REFLEX MICROSCOPIC
Bilirubin Urine: NEGATIVE
Hgb urine dipstick: NEGATIVE
Ketones, ur: NEGATIVE
LEUKOCYTE UA: NEGATIVE
Nitrite: NEGATIVE
Specific Gravity, Urine: 1.015 (ref 1.000–1.030)
Total Protein, Urine: NEGATIVE
URINE GLUCOSE: NEGATIVE
Urobilinogen, UA: 0.2 (ref 0.0–1.0)
WBC, UA: NONE SEEN (ref 0–?)
pH: 6.5 (ref 5.0–8.0)

## 2019-01-01 LAB — LIPID PANEL
Cholesterol: 128 mg/dL (ref 0–200)
HDL: 34.6 mg/dL — ABNORMAL LOW (ref >39.00)
LDL Cholesterol: 65 mg/dL (ref 0–99)
NonHDL: 93.52
Total CHOL/HDL Ratio: 4
Triglycerides: 143 mg/dL (ref 0.0–149.0)
VLDL: 28.6 mg/dL (ref 0.0–40.0)

## 2019-01-01 LAB — HEMOGLOBIN A1C: Hgb A1c MFr Bld: 5.3 % (ref 4.6–6.5)

## 2019-01-01 MED ORDER — LOSARTAN POTASSIUM 50 MG PO TABS: 50.0000 mg | ORAL_TABLET | Freq: Two times a day (BID) | ORAL | 3 refills | Status: DC

## 2019-01-01 NOTE — Patient Instructions (Signed)

## 2019-01-01 NOTE — Progress Notes (Signed)
Subjective:    Patient ID: David Kim, male    DOB: 03-16-1946, 73 y.o.   MRN: [id number]  HPI  Here for yearly f/u;  Overall doing ok;  Pt denies Chest pain, worsening SOB, DOE, wheezing, orthopnea, PND, worsening LE edema, palpitations, dizziness or syncope.  Pt denies neurological change such as new headache, facial or extremity weakness.  Pt denies polydipsia, polyuria, or low sugar symptoms. Pt states overall good compliance with treatment and medications, good tolerability, and has been trying to follow appropriate diet.  Pt denies worsening depressive symptoms, suicidal ideation or panic. No fever, night sweats, wt loss, loss of appetite, or other constitutional symptoms.  Pt states good ability with ADL's, has low fall risk, home safety reviewed and adequate, no other significant changes in hearing or vision, and remains wheelchair bound, has gained several lbs   No new complaints Past Medical History:  Diagnosis Date  . Allergic rhinitis   . ALLERGIC RHINITIS 10/01/2007   Qualifier: Diagnosis of  By: Jenny Reichmann MD, Hunt Oris   . Anxiety   . Carpal tunnel syndrome    right  . Depression   . Depression   . Erectile dysfunction   . Fatigue   . GERD (gastroesophageal reflux disease)   . Gout   . Hypertension   . HYPOGONADISM 03/31/2008   Qualifier: Diagnosis of  By: Jenny Reichmann MD, Hunt Oris   . IBS (irritable bowel syndrome)   . Low back pain   . OSA (obstructive sleep apnea)   . Paraplegia ([place]) 04/23/2009   Qualifier: Diagnosis of  By: Jenny Reichmann MD, Hunt Oris   . Peptic ulcer disease   . PEPTIC ULCER DISEASE 10/01/2007   Qualifier: Diagnosis of  By: Jenny Reichmann MD, Hunt Oris   . Prostate cancer [facility])   . PROSTATE CANCER, HX OF 05/23/2007   Qualifier: Diagnosis of  By: Jenny Reichmann MD, [place] PARALYSIS 04/23/2009   Qualifier: Diagnosis of  By: Jenny Reichmann MD, Hunt Oris   . Thoracic spinal cord injury ([place]) 03/12/2012   Past Surgical History:  Procedure Laterality Date  . ARTERY AND TENDON REPAIR Left  09/06/2018   Procedure: ARTERY AND TENDON REPAIR;  Surgeon: Charlotte Crumb, MD;  Location: [facility];  Service: Orthopedics;  Laterality: Left;  . CAST APPLICATION Left 46/03/353   Procedure: CAST APPLICATION;  Surgeon: Charlotte Crumb, MD;  Location: [facility];  Service: Orthopedics;  Laterality: Left;  . inguinal heniorrhaphy    . left leg surgery after fibula    . NERVE REPAIR Left 09/06/2018   Procedure: NERVE REPAIR TIMES TWO;  Surgeon: Charlotte Crumb, MD;  Location: [place];  Service: Orthopedics;  Laterality: Left;  . OPEN REDUCTION INTERNAL FIXATION (ORIF) DISTAL PHALANX Left 09/06/2018   Procedure: OPEN REDUCTION INTERNAL FIXATION (ORIF) LEFT LONG FINGER;  Surgeon: Charlotte Crumb, MD;  Location: [facility];  Service: Orthopedics;  Laterality: Left;  . PILONIDAL CYST / SINUS EXCISION    . PROSTATECTOMY    . tonsillectomy    . WOUND EXPLORATION Left 09/06/2018   Procedure: EXPLORATION OFCOMPLEX INJURY;  Surgeon: Charlotte Crumb, MD;  Location: [facility];  Service: Orthopedics;  Laterality: Left;    reports that he has never smoked. He has never used smokeless tobacco. He reports that he does not drink alcohol or use drugs. family history includes Dementia in his mother; Diabetes in his father; High blood pressure in his mother. Allergies  Allergen Reactions  . Amoxicillin Other (See Comments)  . Endal  Hd Other (See Comments)   Current Outpatient Medications on File Prior to Visit  Medication Sig Dispense Refill  . antiseptic oral rinse (BIOTENE) LIQD 15 mLs by Mouth Rinse route as needed for dry mouth.    Marland Kitchen aspirin (ECOTRIN LOW STRENGTH) 81 MG EC tablet Take 81 mg by mouth daily.      . baclofen (LIORESAL) 10 MG tablet Take 1 tablet (10 mg total) by mouth 4 (four) times daily. Taking additional 10mg  tablet (1/2 tab in 0500 and 1700). 360 each 3  . baclofen (LIORESAL) 20 MG tablet Take 1 tablet (20 mg total) by mouth 4 (four) times daily. 360 tablet 3  . CALCIUM-MAGNESIUM PO Take 1 tablet by  mouth daily.     . clonazePAM (KLONOPIN) 2 MG tablet Take 2 tablets (4 mg total) by mouth at bedtime. 180 tablet 2  . OVER THE COUNTER MEDICATION Take 22.5 mLs by mouth daily. Takes 1.5 tbsp  Mesotrace MV powder    . oxybutynin (DITROPAN) 5 MG tablet Take 5 mg by mouth 4 (four) times daily.     Marland Kitchen tiZANidine (ZANAFLEX) 4 MG tablet take 1-2 tablets by mouth four times a day 620 tablet 3  . vitamin C (ASCORBIC ACID) 500 MG tablet Take 500 mg by mouth daily.    . vitamin E (VITAMIN E) 400 UNIT capsule Take 800 Units by mouth daily.     No current facility-administered medications on file prior to visit.    Review of Systems  Constitutional: Negative for other unusual diaphoresis or sweats HENT: Negative for ear discharge or swelling Eyes: Negative for other worsening visual disturbances Respiratory: Negative for stridor or other swelling  Gastrointestinal: Negative for worsening distension or other blood Genitourinary: Negative for retention or other urinary change Musculoskeletal: Negative for other MSK pain or swelling Skin: Negative for color change or other new lesions Neurological: Negative for worsening tremors and other numbness  Psychiatric/Behavioral: Negative for worsening agitation or other fatigue All  Other system neg per pt    Objective:   Physical Exam BP 116/64   Pulse 87   Temp 98.4 F (36.9 C) (Oral)   Ht 5\' 8"  (1.727 m)   SpO2 97%   BMI 30.41 kg/m  VS noted,  Constitutional: Pt appears in NAD HENT: Head: NCAT.  Right Ear: External ear normal.  Left Ear: External ear normal.  Eyes: . Pupils are equal, round, and reactive to light. Conjunctivae and EOM are normal Nose: without d/c or deformity Neck: Neck supple. Gross normal ROM Cardiovascular: Normal rate and regular rhythm.   Pulmonary/Chest: Effort normal and breath sounds without rales or wheezing.  Abd:  Soft, NT, ND, + BS, no organomegaly Neurological: Pt is alert. At baseline orientation, motor grossly  intact to UE's Skin: Skin is warm. No rashes, other new lesions, no LE edema Psychiatric: Pt behavior is normal without agitation  No other exam findings Lab Results  Component Value Date   WBC 11.7 (H) 09/06/2018   HGB 15.3 09/06/2018   HCT 48.8 09/06/2018   PLT 202 09/06/2018   GLUCOSE 127 (H) 09/06/2018   CHOL 136 03/29/2018   TRIG 269.0 (H) 03/29/2018   HDL 29.90 (L) 03/29/2018   LDLDIRECT 86.0 03/29/2018   LDLCALC 80 03/28/2016   ALT 16 03/29/2018   AST 19 03/29/2018   NA 136 09/06/2018   K 3.9 09/06/2018   CL 108 09/06/2018   CREATININE 0.97 09/06/2018   BUN 15 09/06/2018   CO2 20 (L) 09/06/2018  TSH 1.71 03/29/2018   PSA 0.17 03/29/2018   INR 0.99 10/06/2016   HGBA1C 5.2 03/29/2018       Assessment & Plan:

## 2019-01-01 NOTE — Assessment & Plan Note (Signed)
stable overall by history and exam, recent data reviewed with pt, and pt to continue medical treatment as before,  to f/u any worsening symptoms or concerns, for f/u lab 

## 2019-01-01 NOTE — Assessment & Plan Note (Signed)
stable overall by history and exam, recent data reviewed with pt, and pt to continue medical treatment as before,  to f/u any worsening symptoms or concerns, cont same tx 

## 2019-01-01 NOTE — Assessment & Plan Note (Signed)
Stable, for f/u psa °

## 2019-01-02 ENCOUNTER — Encounter: Payer: Self-pay | Admitting: Internal Medicine

## 2019-01-02 LAB — VITAMIN B12: Vitamin B-12: 252 pg/mL (ref 211–911)

## 2019-01-02 LAB — TSH: TSH: 1.99 u[IU]/mL (ref 0.35–4.50)

## 2019-01-02 LAB — VITAMIN D 25 HYDROXY (VIT D DEFICIENCY, FRACTURES): VITD: 31.05 ng/mL (ref 30.00–100.00)

## 2019-01-02 LAB — PSA: PSA: 0.11 ng/mL (ref 0.10–4.00)

## 2019-01-05 ENCOUNTER — Encounter: Payer: Self-pay | Admitting: Internal Medicine

## 2019-01-06 ENCOUNTER — Ambulatory Visit (INDEPENDENT_AMBULATORY_CARE_PROVIDER_SITE_OTHER): Payer: Medicare Other | Admitting: Internal Medicine

## 2019-01-06 ENCOUNTER — Encounter: Payer: Self-pay | Admitting: Internal Medicine

## 2019-01-06 VITALS — BP 130/68 | HR 97

## 2019-01-06 DIAGNOSIS — G8220 Paraplegia, unspecified: Secondary | ICD-10-CM | POA: Diagnosis not present

## 2019-01-06 DIAGNOSIS — G4733 Obstructive sleep apnea (adult) (pediatric): Secondary | ICD-10-CM

## 2019-01-06 DIAGNOSIS — G8114 Spastic hemiplegia affecting left nondominant side: Secondary | ICD-10-CM | POA: Diagnosis not present

## 2019-01-06 NOTE — Assessment & Plan Note (Signed)
He continues to benefit from CPAP with better sleep, confirmed by wife and download.  Plan- replace old CPAP when eligible- auto

## 2019-01-06 NOTE — Progress Notes (Signed)
   Subjective:    Patient ID: David Kim, male    DOB: 1946/04/16, 73 y.o.   MRN: [id number]  HPI   male followed for OSA/Insomnia, complicated by paraplegia after excision spinal cord tumor, GERD, HBP, GERD NPSG 2003, AHI 31 / hr ----------------------------------------------------------------------------------------- 10/08/17- 73 year old male followed for OSA/Insomnia, complicated by paraplegia, GERD, HBP, GERD CPAP 10/ [facility] ---OSA: DME;[facility]. Pt wears CPAP nightly and DL attached. No new supplies needed at this time.  He feels he is doing well with CPAP.  Download confirms 87% compliance AHI 1.3/hour. We discussed what to do with power outages, backup batteries etc. He has lost several teeth due to dry mouth.  Familiar with [employer].  01/06/2019- 73 year old male followed for OSA/Insomnia, complicated by paraplegia after spinal tumor resected, GERD, HBP,  CPAP 10/ [facility]>> today replace old machine, auto 5-15 Download 77% compliance, AHI 1.6/ hr -----breathing stable; on CPAP, uses every night; states may need new CPAP machine & wheelchair CPAP works well- confirmed by wife.  Custom wheel chair arms are breaking- need to repair or replace.  Review of Systems   + = positive Constitutional: Negative for fever and unexpected weight change.  HENT: Negative for congestion, dental problem, ear pain, nosebleeds, postnasal drip, rhinorrhea, sinus pressure, sneezing, sore throat and trouble swallowing.   Eyes: Negative for redness and itching.  Respiratory: Negative for cough, chest tightness, shortness of breath and wheezing.   Cardiovascular: Negative for palpitations, + leg swelling.  Gastrointestinal: Negative for nausea and vomiting.  Genitourinary: Negative for dysuria.  Musculoskeletal: Negative for joint swelling.  Skin: Negative for rash.  Neurological: Negative for headaches.  Hematological: Does not bruise/bleed easily.  Psychiatric/Behavioral: Negative for dysphoric mood.  The patient is not nervous/anxious.      Objective:  OBJ- Physical Exam General- Alert, Oriented, Affect-appropriate, Distress- none acute, + Wheelchair, + obese Skin- rash-none, lesions- none, excoriation- none Lymphadenopathy- none Head- atraumatic            Eyes- Gross vision intact, PERRLA, conjunctivae and secretions clear            Ears- Hearing, canals-normal            Nose- Clear, no-Septal dev, mucus, polyps, erosion, perforation             Throat- Mallampati II , mucosa clear , drainage- none, tonsils- atrophic, + missing teeth                   neck- flexible , trachea midline, no stridor , thyroid nl, carotid no bruit Chest - symmetrical excursion , unlabored           Heart/CV- RRR , no murmur , no gallop  , no rub, nl s1 s2                           - JVD- none , edema-+2, R>L, stasis changes- none, varices- none           Lung- clear to P&A, wheeze- none, cough- none , dullness-none, rub- none           Chest wall-  Abd- +borborygmi Br/ Gen/ Rectal- Not done, not indicated Extrem- cyanosis- none, clubbing, none, atrophy- none, strength- nl Neuro- +paraplegic      Assessment & Plan:

## 2019-01-06 NOTE — Assessment & Plan Note (Signed)
He is dependent on wheel chair and has been using a custom chair from [facility]. Arms are breaking off and chair needs replacement. Plan replace custom wheel chair. May need PT reassessment to qualify.

## 2019-01-06 NOTE — Patient Instructions (Signed)
Order- [facility]ME [facility]: 1)- Please replace old CPAP machine, change to auto 5-15, mask of choice, humidifier, supplies, AirView/ card  2) Repair or replace custom wheelchair- chair arms are broken   Dx paraplegia        [email or web address]    AeroX chair

## 2019-01-13 ENCOUNTER — Ambulatory Visit: Payer: Medicare Other | Admitting: Physical Medicine & Rehabilitation

## 2019-01-14 DIAGNOSIS — Z4789 Encounter for other orthopedic aftercare: Secondary | ICD-10-CM | POA: Diagnosis not present

## 2019-01-23 ENCOUNTER — Encounter: Payer: Self-pay | Admitting: Nurse Practitioner

## 2019-01-23 ENCOUNTER — Encounter: Payer: Self-pay | Admitting: General Surgery

## 2019-01-23 ENCOUNTER — Ambulatory Visit (INDEPENDENT_AMBULATORY_CARE_PROVIDER_SITE_OTHER): Payer: Medicare Other | Admitting: Nurse Practitioner

## 2019-01-23 ENCOUNTER — Other Ambulatory Visit: Payer: Self-pay

## 2019-01-23 DIAGNOSIS — G4733 Obstructive sleep apnea (adult) (pediatric): Secondary | ICD-10-CM | POA: Diagnosis not present

## 2019-01-23 NOTE — Assessment & Plan Note (Signed)
Patient presents today for CPAP follow-up after getting new CPAP machine.  He is concerned that his CPAP settings are wrong.  I verified that the settings are incorrect and will place the order for them to be changed to AutoSet 5 to 15 cm H2O.   Patient Instructions  Patient continues to benefit from CPAP with good compliance and control documented CPAP settings should be Auto Set 5-15 cm H20 - please adjust Continue current medications Goal of 4 hours or more usage per night Maintain healthy weight Do not drive if drowsy Follow up with Dr. Annamaria Boots in 3 months or sooner if needed

## 2019-01-23 NOTE — Progress Notes (Signed)
@Patient  ID: David Kim, male    DOB: [birth year], 73 y.o.   MRN: [id number]  Chief Complaint  Patient presents with  . CPAP Compliance    Obstructive Sleep Apnea    Referring provider: Biagio Borg, MD  HPI 73 year old male followed for OSA/Insomnia, complicated by paraplegia after excision spinal cord tumor, GERD, HBP, GERD  Tests:  NPSG 2003, AHI 31 / hr  Compliance report 12/18/18 - 01/16/19: usage days 30/30 (100%), average usage 4 hours 58 minutes, set pressure 120 cmH20, AHI:1.6.   OV 01/23/19 - Cpap follow up Patient presents today for CPAP follow-up.  He states that he got a new CPAP machine 2 weeks ago.  He has been compliant with his CPAP he wears it nightly.  He usually sleeps around 5 hours per night.  He denies any issues with mask.  He states that the machine was supposed to be auto set but is on a manual set pressure.  He is questioning if this is the correct pressure.  He denies any nightmares or sleepwalking.  He is any excessive snoring.  States that he does benefit from the CPAP and feels much less drowsy during the day. Denies f/c/s, n/v/d, hemoptysis, PND, leg swelling.    Allergies  Allergen Reactions  . Amoxicillin Other (See Comments)  . Endal Hd Other (See Comments)    Immunization History  Administered Date(s) Administered  . Influenza Split 07/31/2011, 07/30/2012  . Influenza Whole 10/01/2007, 07/28/2008, 08/04/2009, 08/03/2010  . Influenza, High Dose Seasonal PF 09/27/2016, 10/03/2017  . Influenza,inj,Quad PF,6+ Mos 07/09/2013, 07/23/2014, 09/07/2015  . Influenza-Unspecified 07/08/2018  . Pneumococcal Conjugate-13 04/15/2014  . Pneumococcal Polysaccharide-23 10/12/2008, 10/21/2014  . Td 07/28/2008  . Tdap 09/06/2018  . Zoster 10/12/2008    Past Medical History:  Diagnosis Date  . Allergic rhinitis   . ALLERGIC RHINITIS 10/01/2007   Qualifier: Diagnosis of  By: Jenny Reichmann MD, Hunt Oris   . Anxiety   . Carpal tunnel syndrome    right  .  Depression   . Depression   . Erectile dysfunction   . Fatigue   . GERD (gastroesophageal reflux disease)   . Gout   . Hypertension   . HYPOGONADISM 03/31/2008   Qualifier: Diagnosis of  By: Jenny Reichmann MD, Hunt Oris   . IBS (irritable bowel syndrome)   . Low back pain   . OSA (obstructive sleep apnea)   . Paraplegia ([place]) 04/23/2009   Qualifier: Diagnosis of  By: Jenny Reichmann MD, Hunt Oris   . Peptic ulcer disease   . PEPTIC ULCER DISEASE 10/01/2007   Qualifier: Diagnosis of  By: Jenny Reichmann MD, Hunt Oris   . Prostate cancer [facility])   . PROSTATE CANCER, HX OF 05/23/2007   Qualifier: Diagnosis of  By: Jenny Reichmann MD, [place] PARALYSIS 04/23/2009   Qualifier: Diagnosis of  By: Jenny Reichmann MD, Hunt Oris   . Thoracic spinal cord injury ([place]) 03/12/2012    Tobacco History: Social History   Tobacco Use  Smoking Status Never Smoker  Smokeless Tobacco Never Used   Counseling given: Not Answered   Outpatient Encounter Medications as of 01/23/2019  Medication Sig  . antiseptic oral rinse (BIOTENE) LIQD 15 mLs by Mouth Rinse route as needed for dry mouth.  Marland Kitchen aspirin (ECOTRIN LOW STRENGTH) 81 MG EC tablet Take 81 mg by mouth daily.    . baclofen (LIORESAL) 10 MG tablet Take 1 tablet (10 mg total) by mouth 4 (four) times daily. Taking additional 10mg   tablet (1/2 tab in 0500 and 1700).  . baclofen (LIORESAL) 20 MG tablet Take 1 tablet (20 mg total) by mouth 4 (four) times daily.  Marland Kitchen CALCIUM-MAGNESIUM PO Take 1 tablet by mouth daily.   . clonazePAM (KLONOPIN) 2 MG tablet Take 2 tablets (4 mg total) by mouth at bedtime.  Marland Kitchen losartan (COZAAR) 50 MG tablet Take 1 tablet (50 mg total) by mouth 2 (two) times daily.  Marland Kitchen OVER THE COUNTER MEDICATION Take 22.5 mLs by mouth daily. Takes 1.5 tbsp  Mesotrace MV powder  . oxybutynin (DITROPAN) 5 MG tablet Take 5 mg by mouth 4 (four) times daily.   Marland Kitchen tiZANidine (ZANAFLEX) 4 MG tablet take 1-2 tablets by mouth four times a day  . vitamin C (ASCORBIC ACID) 500 MG tablet Take 500 mg by mouth  daily.  . vitamin E (VITAMIN E) 400 UNIT capsule Take 800 Units by mouth daily.   No facility-administered encounter medications on file as of 01/23/2019.      Review of Systems  Review of Systems  Constitutional: Negative.  Negative for chills and fever.  HENT: Negative.   Respiratory: Negative for cough, shortness of breath and wheezing.   Cardiovascular: Negative.  Negative for chest pain, palpitations and leg swelling.  Gastrointestinal: Negative.   Allergic/Immunologic: Negative.   Neurological: Negative.   Psychiatric/Behavioral: Negative.        Physical Exam  BP 130/74 (BP Location: Right Arm, Patient Position: Sitting, Cuff Size: Normal)   Pulse 75   Ht 5\' 8"  (1.727 m)   Wt 205 lb (93 kg) Comment: patient in wheelchair, unable to weigh  SpO2 96%   BMI 31.17 kg/m   Wt Readings from Last 5 Encounters:  01/23/19 205 lb (93 kg)  09/06/18 200 lb (90.7 kg)  07/03/18 210 lb (95.3 kg)  03/29/18 243 lb (110.2 kg)  10/06/16 189 lb 9.5 oz (86 kg)     Physical Exam Vitals signs and nursing note reviewed.  Constitutional:      General: He is not in acute distress.    Appearance: He is well-developed.  Cardiovascular:     Rate and Rhythm: Normal rate and regular rhythm.  Pulmonary:     Effort: Pulmonary effort is normal. No respiratory distress.     Breath sounds: Normal breath sounds. No wheezing or rhonchi.  Musculoskeletal:        General: No swelling.  Skin:    General: Skin is warm and dry.  Neurological:     Mental Status: He is alert and oriented to person, place, and time.       Assessment & Plan:   Obstructive sleep apnea Patient presents today for CPAP follow-up after getting new CPAP machine.  He is concerned that his CPAP settings are wrong.  I verified that the settings are incorrect and will place the order for them to be changed to AutoSet 5 to 15 cm H2O.   Patient Instructions  Patient continues to benefit from CPAP with good compliance  and control documented CPAP settings should be Auto Set 5-15 cm H20 - please adjust Continue current medications Goal of 4 hours or more usage per night Maintain healthy weight Do not drive if drowsy Follow up with Dr. Annamaria Boots in 3 months or sooner if needed        Fenton Foy, NP 01/23/2019

## 2019-01-23 NOTE — Patient Instructions (Signed)
Patient continues to benefit from CPAP with good compliance and control documented CPAP settings should be Auto Set 5-15 cm H20 - please adjust Continue current medications Goal of 4 hours or more usage per night Maintain healthy weight Do not drive if drowsy Follow up with Dr. Annamaria Boots in 3 months or sooner if needed

## 2019-01-29 ENCOUNTER — Encounter: Payer: Medicare Other | Attending: Physical Medicine & Rehabilitation | Admitting: Physical Medicine & Rehabilitation

## 2019-01-29 ENCOUNTER — Encounter: Payer: Self-pay | Admitting: Physical Medicine & Rehabilitation

## 2019-01-29 ENCOUNTER — Other Ambulatory Visit: Payer: Self-pay

## 2019-01-29 VITALS — BP 112/71 | HR 84 | Temp 98.2°F | Ht 68.0 in | Wt 200.0 lb

## 2019-01-29 DIAGNOSIS — G8220 Paraplegia, unspecified: Secondary | ICD-10-CM

## 2019-01-29 DIAGNOSIS — S24109S Unspecified injury at unspecified level of thoracic spinal cord, sequela: Secondary | ICD-10-CM

## 2019-01-29 DIAGNOSIS — G839 Paralytic syndrome, unspecified: Secondary | ICD-10-CM | POA: Diagnosis not present

## 2019-01-29 MED ORDER — CLONAZEPAM 2 MG PO TABS: 4.0000 mg | ORAL_TABLET | Freq: Every day | ORAL | 2 refills | Status: DC

## 2019-01-29 MED ORDER — TIZANIDINE HCL 4 MG PO TABS: ORAL_TABLET | ORAL | 3 refills | Status: DC

## 2019-01-29 NOTE — Patient Instructions (Signed)
PLEASE FEEL FREE TO CALL OUR OFFICE WITH ANY PROBLEMS OR QUESTIONS ([phone])      

## 2019-01-29 NOTE — Progress Notes (Signed)
Botox Injection for spasticity using needle EMG guidance Indication: Spastic diplegia, acquired, lower extremity ([place]) - Plan: clonazePAM (KLONOPIN) 2 MG tablet  Thoracic spinal cord injury, sequela ([place]) - Plan: clonazePAM (KLONOPIN) 2 MG tablet  Paralysis ([place]) - Plan: clonazePAM (KLONOPIN) 2 MG tablet   Dilution: 100 Units/ml        Total Units Injected: 400 Indication: Severe spasticity which interferes with ADL,mobility and/or  hygiene and is unresponsive to medication management and other conservative care Informed consent was obtained after describing risks and benefits of the procedure with the patient. This includes bleeding, bruising, infection, excessive weakness, or medication side effects. A REMS form is on file and signed.  Needle: 98mm injectable monopolar needle electrode  Number of units per muscle   LLE Quadriceps 400 units- rectus femoris/vastus intermedius 300u, 100u vastus lateralis  Gastroc/soleus 0 units Hamstrings 0 units Tibialis Posterior 0 units EHL 0 units All injections were done after obtaining appropriate EMG activity and after negative drawback for blood. The patient tolerated the procedure well. Post procedure instructions were given. Return in about 3 months (around 05/01/2019) for 400u botox LLE.   Refilled tizanidine and klonopin today

## 2019-02-02 ENCOUNTER — Other Ambulatory Visit: Payer: Self-pay | Admitting: Internal Medicine

## 2019-02-11 ENCOUNTER — Telehealth: Payer: Self-pay | Admitting: Internal Medicine

## 2019-02-11 NOTE — Telephone Encounter (Signed)
Called [name] back and made aware that they would need another visit before 04/17/19. nothing further needed.

## 2019-02-11 NOTE — Telephone Encounter (Signed)
Called to find out what was needed as patient was set up on 01/16/19. Spouse [name] states if he takes mask off it continues to run. Old machine would cut off/then start back once mask applied. Also she wanted to know why they would need another face to face visit as they were here twice in March 3/2 and 3/19. Called Melissa with [employer] she will put in a ticket to tech support. She will also inquire about 90 day compliance visit. Called [name] back and let her know she would be receiving a call from [employer]. Informed if not satisfied with solution to call office back. Nothing further needed.

## 2019-02-11 NOTE — Telephone Encounter (Signed)
Placed a call to [employer] spoke to [place]. She is going to look into and call back with status.

## 2019-02-17 ENCOUNTER — Telehealth: Payer: Self-pay

## 2019-02-17 NOTE — Telephone Encounter (Signed)
Returned call to paitent's spouse and scheduled cpap compliance 5/4/@ 2pm with Dr.Young. Nothing further needed.

## 2019-03-09 ENCOUNTER — Encounter: Payer: Self-pay | Admitting: Internal Medicine

## 2019-03-10 ENCOUNTER — Encounter: Payer: Self-pay | Admitting: Internal Medicine

## 2019-03-10 ENCOUNTER — Ambulatory Visit (INDEPENDENT_AMBULATORY_CARE_PROVIDER_SITE_OTHER): Payer: Medicare Other | Admitting: Internal Medicine

## 2019-03-10 ENCOUNTER — Other Ambulatory Visit: Payer: Self-pay

## 2019-03-10 DIAGNOSIS — F5101 Primary insomnia: Secondary | ICD-10-CM

## 2019-03-10 DIAGNOSIS — G4733 Obstructive sleep apnea (adult) (pediatric): Secondary | ICD-10-CM

## 2019-03-10 NOTE — Progress Notes (Signed)
Subjective:    Patient ID: David Kim, male    DOB: June 25, 1946, 73 y.o.   MRN: [id number]  HPI   male followed for OSA/Insomnia, complicated by paraplegia after excision spinal cord tumor, GERD, HBP, GERD NPSG 2003, AHI 31 / hr -----------------------------------------------------------------------------------------   01/06/2019- 73 year old male followed for OSA/Insomnia, complicated by paraplegia after spinal tumor resected, GERD, HBP,  CPAP 10/ [facility]>> today replace old machine, auto 5-15 Download 77% compliance, AHI 1.6/ hr -----breathing stable; on CPAP, uses every night; states may need new CPAP machine & wheelchair CPAP works well- confirmed by wife.  Custom wheel chair arms are breaking- need to repair or replace.  Review of Systems   + = positive Constitutional: Negative for fever and unexpected weight change.  HENT: Negative for congestion, dental problem, ear pain, nosebleeds, postnasal drip, rhinorrhea, sinus pressure, sneezing, sore throat and trouble swallowing.   Eyes: Negative for redness and itching.  Respiratory: Negative for cough, chest tightness, shortness of breath and wheezing.   Cardiovascular: Negative for palpitations, + leg swelling.  Gastrointestinal: Negative for nausea and vomiting.  Genitourinary: Negative for dysuria.  Musculoskeletal: Negative for joint swelling.  Skin: Negative for rash.  Neurological: Negative for headaches.  Hematological: Does not bruise/bleed easily.  Psychiatric/Behavioral: Negative for dysphoric mood. The patient is not nervous/anxious.    Objective:  OBJ- Physical Exam General- Alert, Oriented, Affect-appropriate, Distress- none acute, + Wheelchair, + obese Skin- rash-none, lesions- none, excoriation- none Lymphadenopathy- none Head- atraumatic            Eyes- Gross vision intact, PERRLA, conjunctivae and secretions clear            Ears- Hearing, canals-normal            Nose- Clear, no-Septal dev, mucus,  polyps, erosion, perforation             Throat- Mallampati II , mucosa clear , drainage- none, tonsils- atrophic, + missing teeth                   neck- flexible , trachea midline, no stridor , thyroid nl, carotid no bruit Chest - symmetrical excursion , unlabored           Heart/CV- RRR , no murmur , no gallop  , no rub, nl s1 s2                           - JVD- none , edema-+2, R>L, stasis changes- none, varices- none           Lung- clear to P&A, wheeze- none, cough- none , dullness-none, rub- none           Chest wall-  Abd- +borborygmi Br/ Gen/ Rectal- Not done, not indicated Extrem- cyanosis- none, clubbing, none, atrophy- none, strength- nl Neuro- +paraplegic    03/10/2019- Virtual Visit via Telephone Note  I connected with David Kim on 03/10/19 at  2:00 PM EDT by telephone and verified that I am speaking with the correct person using two identifiers.  Location: Patient:home Provider: office   I discussed the limitations, risks, security and privacy concerns of performing an evaluation and management service by telephone and the availability of in person appointments. I also discussed with the patient that there may be a patient responsible charge related to this service. The patient expressed understanding and agreed to proceed.   History of Present Illness:  73 year old male never smoker followed for OSA David Kim,  complicated by paraplegia after spinal tumor resected, GERD, HBP,  CPAP  auto 5-15/ [employer] Download 77% compliance, AHI 1.6/ hr [employer] replace his CPAP. Needs updated supplies. Asks about a filter in his mask- not sure what he means and he will discuss with [employer].  Never got wheelchair arms repaired. Up to him to find time to take it in.  Observations/Objective: Download 100% compliance, AHI 3/ hr  Assessment and Plan: Benefits from CPAP. Most problems relate to paraplegia.  Continue auto 5-15, replace mask, hoses, supplies  Follow Up Instructions: 1  year   I discussed the assessment and treatment plan with the patient. The patient was provided an opportunity to ask questions and all were answered. The patient agreed with the plan and demonstrated an understanding of the instructions.   The patient was advised to call back or seek an in-person evaluation if the symptoms worsen or if the condition fails to improve as anticipated.  I provided 15 minutes of non-face-to-face time during this encounter.   Baird Lyons, MD

## 2019-03-10 NOTE — Patient Instructions (Signed)
Order- DME [facility]- We can continue CPAP auto 5-15, mask of choice, supplies, filters, hoses, humidifier, AirView/ card  Please call if we can help

## 2019-03-23 NOTE — Assessment & Plan Note (Signed)
Difficulty with sleep-wake cycle reflects lack of activity  Encourage use of light in day and dark at night.

## 2019-03-23 NOTE — Assessment & Plan Note (Signed)
Continues to benefit from CPAP Plan- continue auto 5-15, replace mask, hoses, supplies

## 2019-04-14 ENCOUNTER — Telehealth: Payer: Self-pay | Admitting: Physical Medicine & Rehabilitation

## 2019-04-14 NOTE — Telephone Encounter (Signed)
We can figure that out the day we do the injections.

## 2019-04-14 NOTE — Telephone Encounter (Signed)
Patients wife David Kim called office and requested the botox be on the inside of thighs ----?? Told her I would pass on the information

## 2019-05-07 ENCOUNTER — Encounter: Payer: Self-pay | Admitting: Physical Medicine & Rehabilitation

## 2019-05-07 ENCOUNTER — Other Ambulatory Visit: Payer: Self-pay

## 2019-05-07 ENCOUNTER — Encounter: Payer: Medicare Other | Attending: Physical Medicine & Rehabilitation | Admitting: Physical Medicine & Rehabilitation

## 2019-05-07 VITALS — BP 119/74 | HR 102 | Temp 98.0°F | Ht 68.0 in | Wt 200.0 lb

## 2019-05-07 DIAGNOSIS — S24109S Unspecified injury at unspecified level of thoracic spinal cord, sequela: Secondary | ICD-10-CM | POA: Insufficient documentation

## 2019-05-07 DIAGNOSIS — G839 Paralytic syndrome, unspecified: Secondary | ICD-10-CM | POA: Insufficient documentation

## 2019-05-07 DIAGNOSIS — R4182 Altered mental status, unspecified: Secondary | ICD-10-CM | POA: Diagnosis not present

## 2019-05-07 DIAGNOSIS — G8220 Paraplegia, unspecified: Secondary | ICD-10-CM | POA: Diagnosis not present

## 2019-05-07 DIAGNOSIS — G8114 Spastic hemiplegia affecting left nondominant side: Secondary | ICD-10-CM | POA: Diagnosis not present

## 2019-05-07 MED ORDER — CLONAZEPAM 2 MG PO TABS: 4.0000 mg | ORAL_TABLET | Freq: Every day | ORAL | 3 refills | Status: DC

## 2019-05-07 NOTE — Patient Instructions (Signed)
If botox is working well or not at all, then you can reschedule next visit for a later date (perhaps 2 months later). If botox helped but is wearing off then keep appointment.

## 2019-05-07 NOTE — Progress Notes (Signed)
Botox Injection for spasticity using needle EMG guidance Indication: No diagnosis found.spastic tetraplegia   Dilution: 100 Units/ml        Total Units Injected: 400 Indication: Severe spasticity which interferes with ADL,mobility and/or  hygiene and is unresponsive to medication management and other conservative care Informed consent was obtained after describing risks and benefits of the procedure with the patient. This includes bleeding, bruising, infection, excessive weakness, or medication side effects. A REMS form is on file and signed.  Needle: 91mm injectable monopolar needle electrode  Number of units per muscle  Quadriceps 0 units Gastroc/soleus 0 units Hamstrings 250 units, semimembranosus, tendinosis, and bicep femoris Adductor Magnus 100, Adductor Brevis 50  Tibialis Posterior 0 units EHL 0 units All injections were done after obtaining appropriate EMG activity and after negative drawback for blood. The patient tolerated the procedure well. Post procedure instructions were given. No follow-ups on file. 4 month f/u

## 2019-06-03 DIAGNOSIS — L821 Other seborrheic keratosis: Secondary | ICD-10-CM | POA: Diagnosis not present

## 2019-06-03 DIAGNOSIS — Z85828 Personal history of other malignant neoplasm of skin: Secondary | ICD-10-CM | POA: Diagnosis not present

## 2019-06-03 DIAGNOSIS — L814 Other melanin hyperpigmentation: Secondary | ICD-10-CM | POA: Diagnosis not present

## 2019-06-18 ENCOUNTER — Other Ambulatory Visit: Payer: Self-pay | Admitting: *Deleted

## 2019-06-18 MED ORDER — BACLOFEN 10 MG PO TABS: 10.0000 mg | ORAL_TABLET | Freq: Four times a day (QID) | ORAL | 3 refills | Status: DC

## 2019-07-02 ENCOUNTER — Encounter: Payer: Self-pay | Admitting: Internal Medicine

## 2019-07-02 ENCOUNTER — Other Ambulatory Visit: Payer: Self-pay

## 2019-07-02 ENCOUNTER — Ambulatory Visit (INDEPENDENT_AMBULATORY_CARE_PROVIDER_SITE_OTHER): Payer: Medicare Other | Admitting: Internal Medicine

## 2019-07-02 VITALS — Ht 68.0 in

## 2019-07-02 DIAGNOSIS — Z23 Encounter for immunization: Secondary | ICD-10-CM | POA: Diagnosis not present

## 2019-07-02 DIAGNOSIS — I10 Essential (primary) hypertension: Secondary | ICD-10-CM | POA: Diagnosis not present

## 2019-07-02 DIAGNOSIS — E785 Hyperlipidemia, unspecified: Secondary | ICD-10-CM

## 2019-07-02 DIAGNOSIS — R739 Hyperglycemia, unspecified: Secondary | ICD-10-CM

## 2019-07-02 MED ORDER — LOSARTAN POTASSIUM 50 MG PO TABS: 50.0000 mg | ORAL_TABLET | Freq: Two times a day (BID) | ORAL | 3 refills | Status: DC

## 2019-07-02 NOTE — Patient Instructions (Addendum)
You had the flu shot today  Please continue all other medications as before, and refills have been done if requested.  Please have the pharmacy call with any other refills you may need.  Please continue your efforts at being more active, low cholesterol diet, and weight control.  Please keep your appointments with your specialists as you may have planned  Please return in 6 months, or sooner if needed 

## 2019-07-02 NOTE — Progress Notes (Signed)
Subjective:    Patient ID: David Kim, male    DOB: 11-05-1946, 73 y.o.   MRN: [id number]  HPI  Here to f/u; overall doing ok,  Pt denies chest pain, increasing sob or doe, wheezing, orthopnea, PND, increased LE swelling, palpitations, dizziness or syncope.  Pt denies new neurological symptoms such as new headache, or facial or extremity weakness or numbness.  Pt denies polydipsia, polyuria, or low sugar episode.  Pt states overall good compliance with meds, mostly trying to follow appropriate diet, with wt overall stable,  but little exercise however. No new complaints Past Medical History:  Diagnosis Date  . Allergic rhinitis   . ALLERGIC RHINITIS 10/01/2007   Qualifier: Diagnosis of  By: Jenny Reichmann MD, Hunt Oris   . Anxiety   . Carpal tunnel syndrome    right  . Depression   . Depression   . Erectile dysfunction   . Fatigue   . GERD (gastroesophageal reflux disease)   . Gout   . Hypertension   . HYPOGONADISM 03/31/2008   Qualifier: Diagnosis of  By: Jenny Reichmann MD, Hunt Oris   . IBS (irritable bowel syndrome)   . Low back pain   . OSA (obstructive sleep apnea)   . Paraplegia ([place]) 04/23/2009   Qualifier: Diagnosis of  By: Jenny Reichmann MD, Hunt Oris   . Peptic ulcer disease   . PEPTIC ULCER DISEASE 10/01/2007   Qualifier: Diagnosis of  By: Jenny Reichmann MD, Hunt Oris   . Prostate cancer [facility])   . PROSTATE CANCER, HX OF 05/23/2007   Qualifier: Diagnosis of  By: Jenny Reichmann MD, [place] PARALYSIS 04/23/2009   Qualifier: Diagnosis of  By: Jenny Reichmann MD, Hunt Oris   . Thoracic spinal cord injury ([place]) 03/12/2012   Past Surgical History:  Procedure Laterality Date  . ARTERY AND TENDON REPAIR Left 09/06/2018   Procedure: ARTERY AND TENDON REPAIR;  Surgeon: Charlotte Crumb, MD;  Location: [place];  Service: Orthopedics;  Laterality: Left;  . CAST APPLICATION Left [id number]   Procedure: CAST APPLICATION;  Surgeon: Charlotte Crumb, MD;  Location: [place];  Service: Orthopedics;  Laterality: Left;  . inguinal heniorrhaphy     . left leg surgery after fibula    . NERVE REPAIR Left 09/06/2018   Procedure: NERVE REPAIR TIMES TWO;  Surgeon: Charlotte Crumb, MD;  Location: [place];  Service: Orthopedics;  Laterality: Left;  . OPEN REDUCTION INTERNAL FIXATION (ORIF) DISTAL PHALANX Left 09/06/2018   Procedure: OPEN REDUCTION INTERNAL FIXATION (ORIF) LEFT LONG FINGER;  Surgeon: Charlotte Crumb, MD;  Location: [place];  Service: Orthopedics;  Laterality: Left;  . PILONIDAL CYST / SINUS EXCISION    . PROSTATECTOMY    . tonsillectomy    . WOUND EXPLORATION Left 09/06/2018   Procedure: EXPLORATION OFCOMPLEX INJURY;  Surgeon: Charlotte Crumb, MD;  Location: [place];  Service: Orthopedics;  Laterality: Left;    reports that he has never smoked. He has never used smokeless tobacco. He reports that he does not drink alcohol or use drugs. family history includes Dementia in his mother; Diabetes in his father; High blood pressure in his mother. Allergies  Allergen Reactions  . Amoxicillin Other (See Comments)  . Endal Hd Other (See Comments)   Current Outpatient Medications on File Prior to Visit  Medication Sig Dispense Refill  . antiseptic oral rinse (BIOTENE) LIQD 15 mLs by Mouth Rinse route as needed for dry mouth.    Marland Kitchen aspirin (ECOTRIN LOW STRENGTH) 81 MG EC tablet Take  81 mg by mouth daily.      . baclofen (LIORESAL) 10 MG tablet Take 1 tablet (10 mg total) by mouth 4 (four) times daily. Taking additional 10mg  tablet (1/2 tab in 0500 and 1700). 360 each 3  . baclofen (LIORESAL) 20 MG tablet Take 1 tablet (20 mg total) by mouth 4 (four) times daily. 360 tablet 3  . CALCIUM-MAGNESIUM PO Take 1 tablet by mouth daily.     . clonazePAM (KLONOPIN) 2 MG tablet Take 2 tablets (4 mg total) by mouth at bedtime. 180 tablet 3  . OVER THE COUNTER MEDICATION Take 22.5 mLs by mouth daily. Takes 1.5 tbsp  Mesotrace MV powder    . oxybutynin (DITROPAN) 5 MG tablet Take 5 mg by mouth 4 (four) times daily.     Marland Kitchen tiZANidine (ZANAFLEX) 4 MG  tablet take 1-2 tablets by mouth four times a day 620 tablet 3  . vitamin C (ASCORBIC ACID) 500 MG tablet Take 500 mg by mouth daily.    . vitamin E (VITAMIN E) 400 UNIT capsule Take 800 Units by mouth daily.     No current facility-administered medications on file prior to visit.    Review of Systems  Constitutional: Negative for other unusual diaphoresis or sweats HENT: Negative for ear discharge or swelling Eyes: Negative for other worsening visual disturbances Respiratory: Negative for stridor or other swelling  Gastrointestinal: Negative for worsening distension or other blood Genitourinary: Negative for retention or other urinary change Musculoskeletal: Negative for other MSK pain or swelling Skin: Negative for color change or other new lesions Neurological: Negative for worsening tremors and other numbness  Psychiatric/Behavioral: Negative for worsening agitation or other fatigue All other system neg per pt    Objective:   Physical Exam Ht 5\' 8"  (1.727 m)   BMI 30.41 kg/m  VS noted,  Constitutional: Pt appears in NAD HENT: Head: NCAT.  Right Ear: External ear normal.  Left Ear: External ear normal.  Eyes: . Pupils are equal, round, and reactive to light. Conjunctivae and EOM are normal Nose: without d/c or deformity Neck: Neck supple. Gross normal ROM Cardiovascular: Normal rate and regular rhythm.   Pulmonary/Chest: Effort normal and breath sounds without rales or wheezing.  Abd:  Soft, NT, ND, + BS, no organomegaly Neurological: Pt is alert. At baseline orientation, motor grossly intact to UE's Skin: Skin is warm. No rashes, other new lesions, no LE edema Psychiatric: Pt behavior is normal without agitation  No other exam findings Lab Results  Component Value Date   WBC 10.4 01/01/2019   HGB 15.7 01/01/2019   HCT 45.8 01/01/2019   PLT 247.0 01/01/2019   GLUCOSE 85 01/01/2019   CHOL 128 01/01/2019   TRIG 143.0 01/01/2019   HDL 34.60 (L) 01/01/2019   LDLDIRECT  86.0 03/29/2018   LDLCALC 65 01/01/2019   ALT 16 01/01/2019   AST 20 01/01/2019   NA 138 01/01/2019   K 3.8 01/01/2019   CL 104 01/01/2019   CREATININE 0.69 01/01/2019   BUN 18 01/01/2019   CO2 26 01/01/2019   TSH 1.99 01/01/2019   PSA 0.11 01/01/2019   INR 0.99 10/06/2016   HGBA1C 5.3 01/01/2019       Assessment & Plan:

## 2019-07-05 ENCOUNTER — Encounter: Payer: Self-pay | Admitting: Internal Medicine

## 2019-07-05 NOTE — Assessment & Plan Note (Signed)
stable overall by history and exam, recent data reviewed with pt, and pt to continue medical treatment as before,  to f/u any worsening symptoms or concerns  

## 2019-08-18 DIAGNOSIS — N3946 Mixed incontinence: Secondary | ICD-10-CM | POA: Diagnosis not present

## 2019-08-18 DIAGNOSIS — C61 Malignant neoplasm of prostate: Secondary | ICD-10-CM | POA: Diagnosis not present

## 2019-08-18 DIAGNOSIS — N312 Flaccid neuropathic bladder, not elsewhere classified: Secondary | ICD-10-CM | POA: Diagnosis not present

## 2019-08-18 DIAGNOSIS — N200 Calculus of kidney: Secondary | ICD-10-CM | POA: Diagnosis not present

## 2019-08-25 ENCOUNTER — Other Ambulatory Visit: Payer: Self-pay | Admitting: *Deleted

## 2019-08-25 MED ORDER — BACLOFEN 20 MG PO TABS: 20.0000 mg | ORAL_TABLET | Freq: Four times a day (QID) | ORAL | 0 refills | Status: DC

## 2019-09-10 ENCOUNTER — Other Ambulatory Visit: Payer: Self-pay

## 2019-09-10 ENCOUNTER — Encounter: Payer: Medicare Other | Attending: Physical Medicine & Rehabilitation | Admitting: Physical Medicine & Rehabilitation

## 2019-09-10 ENCOUNTER — Encounter: Payer: Self-pay | Admitting: Physical Medicine & Rehabilitation

## 2019-09-10 VITALS — BP 100/63 | HR 115 | Temp 98.4°F | Ht 68.0 in | Wt 190.0 lb

## 2019-09-10 DIAGNOSIS — G8220 Paraplegia, unspecified: Secondary | ICD-10-CM | POA: Diagnosis not present

## 2019-09-10 DIAGNOSIS — S24109S Unspecified injury at unspecified level of thoracic spinal cord, sequela: Secondary | ICD-10-CM | POA: Insufficient documentation

## 2019-09-10 DIAGNOSIS — G8114 Spastic hemiplegia affecting left nondominant side: Secondary | ICD-10-CM | POA: Diagnosis not present

## 2019-09-10 DIAGNOSIS — G839 Paralytic syndrome, unspecified: Secondary | ICD-10-CM | POA: Diagnosis not present

## 2019-09-10 MED ORDER — CLONAZEPAM 2 MG PO TABS: 4.0000 mg | ORAL_TABLET | Freq: Every day | ORAL | 3 refills | Status: DC

## 2019-09-10 NOTE — Addendum Note (Signed)
Addended by: Alger Simons T on: 09/10/2019 03:03 PM   Modules accepted: Orders

## 2019-09-10 NOTE — Progress Notes (Signed)
Botox Injection for spasticity using needle EMG guidance Indication: Spastic hemiparesis of left nondominant side (HCC) G81.14   Dilution: 100 Units/ml        Total Units Injected: 400  LLE Indication: Severe spasticity which interferes with ADL,mobility and/or  hygiene and is unresponsive to medication management and other conservative care Informed consent was obtained after describing risks and benefits of the procedure with the patient. This includes bleeding, bruising, infection, excessive weakness, or medication side effects. A REMS form is on file and signed.  Needle: 54mm injectable monopolar needle electrode  Number of units per muscle Rectus femoris 200 units Hamstrings 50 units Adductor Magnus 200, Abductor Brevis 50 Hamstrings 0 units Tibialis Posterior 0 units EHL 0 units All injections were done after obtaining appropriate EMG activity and after negative drawback for blood. The patient tolerated the procedure well. Post procedure instructions were given. Return in about 3 months (around 12/11/2019) for f/u botox as needed.

## 2019-09-10 NOTE — Patient Instructions (Addendum)
PLEASE FEEL FREE TO CALL OUR OFFICE WITH ANY PROBLEMS OR QUESTIONS [phone]  Heel protector boots/cushions

## 2019-09-11 ENCOUNTER — Ambulatory Visit (INDEPENDENT_AMBULATORY_CARE_PROVIDER_SITE_OTHER): Payer: Medicare Other | Admitting: Neurology

## 2019-09-11 ENCOUNTER — Encounter: Payer: Self-pay | Admitting: Neurology

## 2019-09-11 ENCOUNTER — Other Ambulatory Visit: Payer: Self-pay

## 2019-09-11 VITALS — BP 110/68 | HR 102 | Temp 97.2°F

## 2019-09-11 DIAGNOSIS — G8220 Paraplegia, unspecified: Secondary | ICD-10-CM | POA: Diagnosis not present

## 2019-09-11 DIAGNOSIS — S24109S Unspecified injury at unspecified level of thoracic spinal cord, sequela: Secondary | ICD-10-CM

## 2019-09-11 DIAGNOSIS — S24109A Unspecified injury at unspecified level of thoracic spinal cord, initial encounter: Secondary | ICD-10-CM | POA: Insufficient documentation

## 2019-09-11 MED ORDER — BACLOFEN 20 MG PO TABS: 20.0000 mg | ORAL_TABLET | Freq: Four times a day (QID) | ORAL | 4 refills | Status: DC

## 2019-09-11 MED ORDER — BACLOFEN 10 MG PO TABS: 10.0000 mg | ORAL_TABLET | Freq: Four times a day (QID) | ORAL | 4 refills | Status: DC

## 2019-09-11 NOTE — Progress Notes (Signed)
[facility]  PATIENT: David Kim DOB: 1946/05/16   REASON FOR VISIT:  Follow-up for paraplegia , due to T 10 cord injury, abnormality of gait HISTORY FROM: patient and wife    HISTORY OF PRESENT ILLNESS: HISTORY:David Kim came in to refill his medications, he is accompanied by his wife, sitting wheelchair  David Kim developed weakness in his legs in 2009. He slipped on ice in January 2009 and fractures ankles requiring pinning procedures. Following that time he developed progressive leg weakness. 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 It became progressively less safe for him to do that. He began to experience difficulty getting out of a car.  He had MRI imaging studies of his head, cervical, and lumbar spine. 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 Following the procedure, the patient became paraplegic, a hematoma was found in the operative site when he was reopened. He did not regain his ability to move his legs which he had been able to do preoperatively.He has sensory level below the umbilicus. He has neurogenic bowel and bladder and wears diapers all the time. Occasional UTI. He was evaluated by David Kim, deemed not a baclofen pump candidate due to nonambulatory, no significant pain. The patient has spasticity in his lower extremities and his torso, but he is not experiencing pain when he has spasms. The spasticity does not awaken him at night time. His tone has not increased to the point where it is difficult to dress him, perform hygiene, or to place him in his wheelchair. He self transfer himself in and out of wheelchair. He is on both baclofen 20+10mg + 5mg  qid and tizanidine 4mg  2 tabs q6hours, clonazepam 2mg  1/2 qam, one qhs. He complains of dry mouth, also taking oxybutynin, but no excessive drowsiness, wife has been stretching his legs on  daily basis.  He has no significant bilateral lower extremity movement, sensory level from umbilical down no significant pain.  UPDATE Oct 8th 2015:He has lower extremity spasm, which is overall manageable recurrent baclofen 20 mg, 1 and half tablets 4 times a day, tizanidine as needed, clonazepam 2 mg every night Today he also reported episode of seeing spider webs in his visual field, with associated blurry vision, mild confusion, lasting 10-15 minutes, there was no associated headaches, which raised the possibility of possible partial seizure, vs. visual aura without migraine, He denies a previous history of migraine, this episode happened 3-4 times each year. He denied previous history of seizure  UPDATE Nov 5th 2020: He has been followed up by rehab physician David Kim and nurse practitioner David Kim over the years, is getting baclofen refilled through our office, 10+20 mg 4 times a day, also taking tizanidine 4 mg 4 times a day, clonazepam 2 mg 2 tablets at nighttime, he has no movement of bilateral lower extremity, sitting in wheelchair, has bowel and bladder regimen, able to transfer himself in and out of wheelchair with the help of his wife,  REVIEW OF SYSTEMS: Full 14 system review of systems performed and notable only for those listed, all others are neg:  As above  ALLERGIES: Allergies  Allergen Reactions  . Amoxicillin Other (See Comments)  . Endal Hd Other (See Comments)    HOME MEDICATIONS: Outpatient Medications Prior to Visit  Medication Sig Dispense Refill  . antiseptic oral rinse (BIOTENE) LIQD 15 mLs by Mouth Rinse route as needed for  dry mouth.    Marland Kitchen aspirin (ECOTRIN LOW STRENGTH) 81 MG EC tablet Take 81 mg by mouth daily.      . baclofen (LIORESAL) 10 MG tablet Take 1 tablet (10 mg total) by mouth 4 (four) times daily. Taking additional 10mg  tablet (1/2 tab in 0500 and 1700). 360 each 3  . baclofen (LIORESAL) 20 MG tablet Take 1 tablet (20 mg total) by mouth 4  (four) times daily. 360 tablet 0  . CALCIUM-MAGNESIUM PO Take 1 tablet by mouth daily.     . clonazePAM (KLONOPIN) 2 MG tablet Take 2 tablets (4 mg total) by mouth at bedtime. 180 tablet 3  . losartan (COZAAR) 50 MG tablet Take 1 tablet (50 mg total) by mouth 2 (two) times daily. 180 tablet 3  . OVER THE COUNTER MEDICATION Take 22.5 mLs by mouth daily. Takes 1.5 tbsp  Mesotrace MV powder    . oxybutynin (DITROPAN) 5 MG tablet Take 5 mg by mouth 4 (four) times daily.     Marland Kitchen tiZANidine (ZANAFLEX) 4 MG tablet take 1-2 tablets by mouth four times a day 620 tablet 3  . vitamin C (ASCORBIC ACID) 500 MG tablet Take 500 mg by mouth daily.    . vitamin E (VITAMIN E) 400 UNIT capsule Take 800 Units by mouth daily.     No facility-administered medications prior to visit.     PAST MEDICAL HISTORY: Past Medical History:  Diagnosis Date  . Allergic rhinitis   . ALLERGIC RHINITIS 10/01/2007   Qualifier: Diagnosis of  By: David Reichmann MD, Hunt Oris   . Anxiety   . Carpal tunnel syndrome    right  . Depression   . Depression   . Erectile dysfunction   . Fatigue   . GERD (gastroesophageal reflux disease)   . Gout   . Hypertension   . HYPOGONADISM 03/31/2008   Qualifier: Diagnosis of  By: David Reichmann MD, Hunt Oris   . IBS (irritable bowel syndrome)   . Low back pain   . OSA (obstructive sleep apnea)   . Paraplegia ([place]) 04/23/2009   Qualifier: Diagnosis of  By: David Reichmann MD, Hunt Oris   . Peptic ulcer disease   . PEPTIC ULCER DISEASE 10/01/2007   Qualifier: Diagnosis of  By: David Reichmann MD, Hunt Oris   . Prostate cancer [facility])   . PROSTATE CANCER, HX OF 05/23/2007   Qualifier: Diagnosis of  By: David Reichmann MD, [place] PARALYSIS 04/23/2009   Qualifier: Diagnosis of  By: David Reichmann MD, Hunt Oris   . Thoracic spinal cord injury ([place]) 03/12/2012    PAST SURGICAL HISTORY: Past Surgical History:  Procedure Laterality Date  . ARTERY AND TENDON REPAIR Left 09/06/2018   Procedure: ARTERY AND TENDON REPAIR;  Surgeon: David Crumb, MD;   Location: [facility];  Service: Orthopedics;  Laterality: Left;  . CAST APPLICATION Left [id number]   Procedure: CAST APPLICATION;  Surgeon: David Crumb, MD;  Location: [place];  Service: Orthopedics;  Laterality: Left;  . inguinal heniorrhaphy    . left leg surgery after fibula    . NERVE REPAIR Left 09/06/2018   Procedure: NERVE REPAIR TIMES TWO;  Surgeon: David Crumb, MD;  Location: [place];  Service: Orthopedics;  Laterality: Left;  . OPEN REDUCTION INTERNAL FIXATION (ORIF) DISTAL PHALANX Left 09/06/2018   Procedure: OPEN REDUCTION INTERNAL FIXATION (ORIF) LEFT LONG FINGER;  Surgeon: David Crumb, MD;  Location: [place];  Service: Orthopedics;  Laterality: Left;  . PILONIDAL CYST / SINUS EXCISION    .  PROSTATECTOMY    . tonsillectomy    . WOUND EXPLORATION Left 09/06/2018   Procedure: EXPLORATION OFCOMPLEX INJURY;  Surgeon: David Crumb, MD;  Location: [place];  Service: Orthopedics;  Laterality: Left;    FAMILY HISTORY: Family History  Problem Relation Age of Onset  . High blood pressure Mother   . Dementia Mother   . Diabetes Father     SOCIAL HISTORY: Social History   Socioeconomic History  . Marital status: Married    Spouse name: [name]  . Number of children: 0  . Years of education: 6  . Highest education level: Not on file  Occupational History  . Occupation: retired    Fish farm manager: RETIRED  Social Needs  . Financial resource strain: Not on file  . Food insecurity    Worry: Not on file    Inability: Not on file  . Transportation needs    Medical: Not on file    Non-medical: Not on file  Tobacco Use  . Smoking status: Never Smoker  . Smokeless tobacco: Never Used  Substance and Sexual Activity  . Alcohol use: No  . Drug use: No  . Sexual activity: Not on file  Lifestyle  . Physical activity    Days per week: Not on file    Minutes per session: Not on file  . Stress: Not on file  Relationships  . Social Herbalist on phone: Not on file     Gets together: Not on file    Attends religious service: Not on file    Active member of club or organization: Not on file    Attends meetings of clubs or organizations: Not on file    Relationship status: Not on file  . Intimate partner violence    Fear of current or ex partner: Not on file    Emotionally abused: Not on file    Physically abused: Not on file    Forced sexual activity: Not on file  Other Topics Concern  . Not on file  Social History Narrative   Patient lives at home with [name]). Patient is disabled. Patient has 12 th grade education.   Right handed.   Caffeine- None     PHYSICAL EXAM  Vitals:   09/11/19 1255  BP: 110/68  Pulse: (!) 102  Temp: (!) 97.2 F (36.2 C)   There is no height or weight on file to calculate BMI. General: alert, well developed, well nourished, in no acute distress Neck: supple  Skin: no rashes   Neurologic Exam  Mental Status: mild obese, in wheel chair Cranial Nerves: CN II-XII pupils were equal round reactive to light. Extraocular movements were full. Visual fields were full on confrontational test. Facial sensation and strength were normal. Hearing was intact to finger rubbing bilaterally. Uvula tongue were midline. Head turning and shoulder shrugging were normal and symmetric. Tongue protrusion into the cheeks strength were normal.  Motor: very strong upper extremity muscles, normal tone, mild to moderate spasticity of lower extremity, only trace movement of proximal and distal leg Sensory: sensory level to T10 bilaterally. Coordination: good finger-to-nose, rapid repetitive alternating movements and finger apposition  Gait and Station: can not bear weight in wheelchair Reflexes: Deep tendon reflexes: Biceps: 2/2, Brachioradialis: 2/2, Triceps: 2/2, Pateller: 2+/2+, sustained bilateral ankle clonus    DIAGNOSTIC DATA (LABS, IMAGING, TESTING) - I reviewed patient records, labs, notes, testing and imaging  myself where available.  Lab Results  Component Value Date  WBC 10.4 01/01/2019   HGB 15.7 01/01/2019   HCT 45.8 01/01/2019   MCV 85.4 01/01/2019   PLT 247.0 01/01/2019      Component Value Date/Time   NA 138 01/01/2019 1529   K 3.8 01/01/2019 1529   CL 104 01/01/2019 1529   CO2 26 01/01/2019 1529   GLUCOSE 85 01/01/2019 1529   BUN 18 01/01/2019 1529   CREATININE 0.69 01/01/2019 1529   CALCIUM 9.2 01/01/2019 1529   PROT 6.8 01/01/2019 1529   ALBUMIN 4.3 01/01/2019 1529   AST 20 01/01/2019 1529   ALT 16 01/01/2019 1529   ALKPHOS 77 01/01/2019 1529   BILITOT 0.8 01/01/2019 1529   GFRNONAA >60 09/06/2018 2245   GFRAA >60 09/06/2018 2245   Lab Results  Component Value Date   CHOL 128 01/01/2019   HDL 34.60 (L) 01/01/2019   LDLCALC 65 01/01/2019   LDLDIRECT 86.0 03/29/2018   TRIG 143.0 01/01/2019   CHOLHDL 4 01/01/2019    Lab Results  Component Value Date   TSH 1.99 01/01/2019      ASSESSMENT AND PLAN  73 y.o. year old male   Spastic paraplegia History of T10 ependymoma surgery  Management with rehab physician David Kim, is receiving Botox injection at bilateral lower extremity  Refill his baclofen 10+20 mg 4 times a day,  Continue rest of the medication management tizanidine, clonazepam is David Kim,  Only return to clinic as needed  Marcial Pacas, M.D. Ph.D.  [facility] [place], [place] [address] Phone: [phone] Fax:      [phone]

## 2019-09-15 ENCOUNTER — Emergency Department (HOSPITAL_COMMUNITY): Payer: Medicare Other

## 2019-09-15 ENCOUNTER — Inpatient Hospital Stay (HOSPITAL_COMMUNITY)
Admission: EM | Admit: 2019-09-15 | Discharge: 2019-09-22 | DRG: 092 | Disposition: A | Discharge status: Rehabilitation Facility | Payer: Medicare Other | Attending: Family Medicine | Admitting: Family Medicine

## 2019-09-15 DIAGNOSIS — G9340 Encephalopathy, unspecified: Secondary | ICD-10-CM | POA: Diagnosis present

## 2019-09-15 DIAGNOSIS — F329 Major depressive disorder, single episode, unspecified: Secondary | ICD-10-CM | POA: Diagnosis present

## 2019-09-15 DIAGNOSIS — R404 Transient alteration of awareness: Secondary | ICD-10-CM | POA: Diagnosis not present

## 2019-09-15 DIAGNOSIS — Z20828 Contact with and (suspected) exposure to other viral communicable diseases: Secondary | ICD-10-CM | POA: Diagnosis not present

## 2019-09-15 DIAGNOSIS — G8110 Spastic hemiplegia affecting unspecified side: Secondary | ICD-10-CM | POA: Diagnosis present

## 2019-09-15 DIAGNOSIS — Z9079 Acquired absence of other genital organ(s): Secondary | ICD-10-CM

## 2019-09-15 DIAGNOSIS — N390 Urinary tract infection, site not specified: Secondary | ICD-10-CM | POA: Diagnosis present

## 2019-09-15 DIAGNOSIS — N179 Acute kidney failure, unspecified: Secondary | ICD-10-CM | POA: Diagnosis not present

## 2019-09-15 DIAGNOSIS — Z03818 Encounter for observation for suspected exposure to other biological agents ruled out: Secondary | ICD-10-CM | POA: Diagnosis not present

## 2019-09-15 DIAGNOSIS — K592 Neurogenic bowel, not elsewhere classified: Secondary | ICD-10-CM | POA: Diagnosis present

## 2019-09-15 DIAGNOSIS — N289 Disorder of kidney and ureter, unspecified: Secondary | ICD-10-CM | POA: Diagnosis not present

## 2019-09-15 DIAGNOSIS — Z888 Allergy status to other drugs, medicaments and biological substances status: Secondary | ICD-10-CM

## 2019-09-15 DIAGNOSIS — Z8546 Personal history of malignant neoplasm of prostate: Secondary | ICD-10-CM

## 2019-09-15 DIAGNOSIS — Z79899 Other long term (current) drug therapy: Secondary | ICD-10-CM

## 2019-09-15 DIAGNOSIS — R0902 Hypoxemia: Secondary | ICD-10-CM | POA: Diagnosis not present

## 2019-09-15 DIAGNOSIS — G9341 Metabolic encephalopathy: Secondary | ICD-10-CM

## 2019-09-15 DIAGNOSIS — G92 Toxic encephalopathy: Principal | ICD-10-CM | POA: Diagnosis present

## 2019-09-15 DIAGNOSIS — Z881 Allergy status to other antibiotic agents status: Secondary | ICD-10-CM

## 2019-09-15 DIAGNOSIS — F22 Delusional disorders: Secondary | ICD-10-CM | POA: Diagnosis present

## 2019-09-15 DIAGNOSIS — G8220 Paraplegia, unspecified: Secondary | ICD-10-CM | POA: Diagnosis not present

## 2019-09-15 DIAGNOSIS — K219 Gastro-esophageal reflux disease without esophagitis: Secondary | ICD-10-CM | POA: Diagnosis present

## 2019-09-15 DIAGNOSIS — K5900 Constipation, unspecified: Secondary | ICD-10-CM | POA: Diagnosis present

## 2019-09-15 DIAGNOSIS — G959 Disease of spinal cord, unspecified: Secondary | ICD-10-CM | POA: Diagnosis not present

## 2019-09-15 DIAGNOSIS — K589 Irritable bowel syndrome without diarrhea: Secondary | ICD-10-CM | POA: Diagnosis present

## 2019-09-15 DIAGNOSIS — S24109S Unspecified injury at unspecified level of thoracic spinal cord, sequela: Secondary | ICD-10-CM

## 2019-09-15 DIAGNOSIS — Z7982 Long term (current) use of aspirin: Secondary | ICD-10-CM

## 2019-09-15 DIAGNOSIS — N319 Neuromuscular dysfunction of bladder, unspecified: Secondary | ICD-10-CM | POA: Diagnosis present

## 2019-09-15 DIAGNOSIS — J309 Allergic rhinitis, unspecified: Secondary | ICD-10-CM | POA: Diagnosis present

## 2019-09-15 DIAGNOSIS — I10 Essential (primary) hypertension: Secondary | ICD-10-CM | POA: Diagnosis present

## 2019-09-15 DIAGNOSIS — N529 Male erectile dysfunction, unspecified: Secondary | ICD-10-CM | POA: Diagnosis present

## 2019-09-15 DIAGNOSIS — R569 Unspecified convulsions: Secondary | ICD-10-CM

## 2019-09-15 DIAGNOSIS — S24109A Unspecified injury at unspecified level of thoracic spinal cord, initial encounter: Secondary | ICD-10-CM | POA: Diagnosis present

## 2019-09-15 DIAGNOSIS — B961 Klebsiella pneumoniae [K. pneumoniae] as the cause of diseases classified elsewhere: Secondary | ICD-10-CM | POA: Diagnosis present

## 2019-09-15 DIAGNOSIS — G4733 Obstructive sleep apnea (adult) (pediatric): Secondary | ICD-10-CM | POA: Diagnosis present

## 2019-09-15 DIAGNOSIS — M109 Gout, unspecified: Secondary | ICD-10-CM | POA: Diagnosis present

## 2019-09-15 DIAGNOSIS — F419 Anxiety disorder, unspecified: Secondary | ICD-10-CM | POA: Diagnosis present

## 2019-09-15 DIAGNOSIS — G928 Other toxic encephalopathy: Secondary | ICD-10-CM | POA: Diagnosis present

## 2019-09-15 DIAGNOSIS — R000 Tachycardia, unspecified: Secondary | ICD-10-CM | POA: Diagnosis not present

## 2019-09-15 DIAGNOSIS — T428X5A Adverse effect of antiparkinsonism drugs and other central muscle-tone depressants, initial encounter: Secondary | ICD-10-CM | POA: Diagnosis present

## 2019-09-15 DIAGNOSIS — E876 Hypokalemia: Secondary | ICD-10-CM | POA: Diagnosis present

## 2019-09-15 LAB — CBC WITH DIFFERENTIAL/PLATELET
Abs Immature Granulocytes: 0.15 10*3/uL — ABNORMAL HIGH (ref 0.00–0.07)
Basophils Absolute: 0.1 10*3/uL (ref 0.0–0.1)
Basophils Relative: 0 %
Eosinophils Absolute: 0.1 10*3/uL (ref 0.0–0.5)
Eosinophils Relative: 1 %
HCT: 43.9 % (ref 39.0–52.0)
Hemoglobin: 14.2 g/dL (ref 13.0–17.0)
Immature Granulocytes: 1 %
Lymphocytes Relative: 7 %
Lymphs Abs: 0.9 10*3/uL (ref 0.7–4.0)
MCH: 29.2 pg (ref 26.0–34.0)
MCHC: 32.3 g/dL (ref 30.0–36.0)
MCV: 90.3 fL (ref 80.0–100.0)
Monocytes Absolute: 0.8 10*3/uL (ref 0.1–1.0)
Monocytes Relative: 6 %
Neutro Abs: 11.5 10*3/uL — ABNORMAL HIGH (ref 1.7–7.7)
Neutrophils Relative %: 85 %
Platelets: 160 10*3/uL (ref 150–400)
RBC: 4.86 MIL/uL (ref 4.22–5.81)
RDW: 14.4 % (ref 11.5–15.5)
WBC: 13.5 10*3/uL — ABNORMAL HIGH (ref 4.0–10.5)
nRBC: 0 % (ref 0.0–0.2)

## 2019-09-15 LAB — COMPREHENSIVE METABOLIC PANEL
ALT: 24 U/L (ref 0–44)
AST: 59 U/L — ABNORMAL HIGH (ref 15–41)
Albumin: 3.1 g/dL — ABNORMAL LOW (ref 3.5–5.0)
Alkaline Phosphatase: 70 U/L (ref 38–126)
Anion gap: 11 (ref 5–15)
BUN: 26 mg/dL — ABNORMAL HIGH (ref 8–23)
CO2: 23 mmol/L (ref 22–32)
Calcium: 8.7 mg/dL — ABNORMAL LOW (ref 8.9–10.3)
Chloride: 104 mmol/L (ref 98–111)
Creatinine, Ser: 1.43 mg/dL — ABNORMAL HIGH (ref 0.61–1.24)
GFR calc Af Amer: 57 mL/min — ABNORMAL LOW (ref >60)
GFR calc non Af Amer: 49 mL/min — ABNORMAL LOW (ref >60)
Glucose, Bld: 158 mg/dL — ABNORMAL HIGH (ref 70–99)
Potassium: 3.3 mmol/L — ABNORMAL LOW (ref 3.5–5.1)
Sodium: 138 mmol/L (ref 135–145)
Total Bilirubin: 1.1 mg/dL (ref 0.3–1.2)
Total Protein: 6.1 g/dL — ABNORMAL LOW (ref 6.5–8.1)

## 2019-09-15 LAB — ETHANOL: Alcohol, Ethyl (B): <10 mg/dL (ref <10)

## 2019-09-15 LAB — LACTIC ACID, PLASMA: Lactic Acid, Venous: 1.4 mmol/L (ref 0.5–1.9)

## 2019-09-15 MED ORDER — ASPIRIN EC 81 MG PO TBEC
81.0000 mg | DELAYED_RELEASE_TABLET | Freq: Every day | ORAL | Status: DC
  Administered 2019-09-16 – 2019-09-22 (×6): 81 mg via ORAL
  Filled 2019-09-15 (×7): qty 1

## 2019-09-15 MED ORDER — LORAZEPAM 2 MG/ML IJ SOLN
1.0000 mg | Freq: Four times a day (QID) | INTRAMUSCULAR | Status: DC
  Administered 2019-09-16 – 2019-09-22 (×22): 1 mg via INTRAVENOUS
  Filled 2019-09-15 (×22): qty 1

## 2019-09-15 MED ORDER — ACETAMINOPHEN 325 MG PO TABS: 650.0000 mg | ORAL_TABLET | Freq: Four times a day (QID) | ORAL | Status: DC | PRN

## 2019-09-15 MED ORDER — OXYBUTYNIN CHLORIDE 5 MG PO TABS
5.0000 mg | ORAL_TABLET | Freq: Four times a day (QID) | ORAL | Status: DC
  Administered 2019-09-16 – 2019-09-22 (×25): 5 mg via ORAL
  Filled 2019-09-15 (×26): qty 1

## 2019-09-15 MED ORDER — VITAMIN E 180 MG (400 UNIT) PO CAPS
800.0000 [IU] | ORAL_CAPSULE | Freq: Every day | ORAL | Status: DC
  Administered 2019-09-16 – 2019-09-22 (×6): 800 [IU] via ORAL
  Filled 2019-09-15 (×7): qty 2

## 2019-09-15 MED ORDER — ONDANSETRON HCL 4 MG PO TABS: 4.0000 mg | ORAL_TABLET | Freq: Four times a day (QID) | ORAL | Status: DC | PRN

## 2019-09-15 MED ORDER — ACETAMINOPHEN 650 MG RE SUPP: 650.0000 mg | Freq: Four times a day (QID) | RECTAL | Status: DC | PRN

## 2019-09-15 MED ORDER — LEVETIRACETAM IN NACL 1500 MG/100ML IV SOLN
1500.0000 mg | Freq: Once | INTRAVENOUS | Status: AC
  Administered 2019-09-15: 1500 mg via INTRAVENOUS
  Filled 2019-09-15: qty 100

## 2019-09-15 MED ORDER — ONDANSETRON HCL 4 MG/2ML IJ SOLN: 4.0000 mg | Freq: Four times a day (QID) | INTRAMUSCULAR | Status: DC | PRN

## 2019-09-15 MED ORDER — VITAMIN C 500 MG PO TABS
500.0000 mg | ORAL_TABLET | Freq: Every day | ORAL | Status: DC
  Administered 2019-09-16 – 2019-09-22 (×6): 500 mg via ORAL
  Filled 2019-09-15 (×7): qty 1

## 2019-09-15 MED ORDER — SODIUM CHLORIDE 0.9 % IV BOLUS
500.0000 mL | Freq: Once | INTRAVENOUS | Status: AC
  Administered 2019-09-15: 500 mL via INTRAVENOUS

## 2019-09-15 MED ORDER — BACLOFEN 10 MG PO TABS
15.0000 mg | ORAL_TABLET | Freq: Three times a day (TID) | ORAL | Status: DC
  Administered 2019-09-16 – 2019-09-17 (×5): 15 mg via ORAL
  Filled 2019-09-15 (×2): qty 2
  Filled 2019-09-15: qty 1
  Filled 2019-09-15 (×2): qty 2
  Filled 2019-09-15: qty 1
  Filled 2019-09-15: qty 2

## 2019-09-15 MED ORDER — LEVETIRACETAM IN NACL 500 MG/100ML IV SOLN
500.0000 mg | Freq: Two times a day (BID) | INTRAVENOUS | Status: DC
  Administered 2019-09-16 – 2019-09-22 (×13): 500 mg via INTRAVENOUS
  Filled 2019-09-15 (×13): qty 100

## 2019-09-15 MED ORDER — TIZANIDINE HCL 4 MG PO TABS
4.0000 mg | ORAL_TABLET | Freq: Four times a day (QID) | ORAL | Status: DC
  Administered 2019-09-16 – 2019-09-22 (×23): 4 mg via ORAL
  Filled 2019-09-15 (×24): qty 1

## 2019-09-15 MED ORDER — ENOXAPARIN SODIUM 40 MG/0.4ML ~~LOC~~ SOLN
40.0000 mg | Freq: Every day | SUBCUTANEOUS | Status: DC
  Administered 2019-09-16 – 2019-09-22 (×5): 40 mg via SUBCUTANEOUS
  Filled 2019-09-15 (×6): qty 0.4

## 2019-09-15 NOTE — ED Notes (Signed)
Patient transported to CT 

## 2019-09-15 NOTE — ED Triage Notes (Signed)
Patient came in via ems; c/o new onswet sz witness by family,. Tonic clonic for about 1 min. Reported hx of spinal tumor and removal 10 years ago ; and patient is no paraplegic. Pt is post ictal ed arrival; non verbal w/ visible blood in mouth area.
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
## 2019-09-15 NOTE — H&P (Addendum)
History and Physical    PARAMVEER SWOVELAND [id number] DOB: Jun 20, 1946 DOA: 09/15/2019  PCP: Biagio Borg, MD  Patient coming from: Home  I have personally briefly reviewed patient's old medical records in [place]  Chief Complaint: Seizure, AMS  HPI: David Kim is a 73 y.o. 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  At baseline patient has paraplegia from the umbilicus down, has neurogenic bladder and bowel, though he apparently is able to bladder empty without catheter use and just wears diapers, with "occasional UTI" (per Dr. Rhea Belton note from 4 days ago).  He has been struggling with BLE spasticity, is on 35mg  QID of baclofen (looks like this reduced to 30mg  QID x4 days ago by Dr. Krista Blue), also on tizanidine, klonopin, and undergoes botox injections with Dr. Naaman Plummer.  This morning patient's wife noticed he was confused and had a bite wound to lower lip.  He remained confused throughout the day today.  This evening he had seizure activity at home and so wife brought him in to ED.  No decubitus ulcer per wife.  Wears CPAP at night while sleeping.   ED Course: In the ED he remains post-ictal.  Is obeying commands with BUE, no verbal response though eye opening to verbal currently and will follow the neurologists finger.  Tm 99.5.  WBC 13.5k  CT head neg for acute findings.  Loaded with keppra.   Review of Systems: Unable to perform due to AMS.  Past Medical History:  Diagnosis Date  . Allergic rhinitis   . ALLERGIC RHINITIS 10/01/2007   Qualifier: Diagnosis of  By: Jenny Reichmann MD, Hunt Oris   . Anxiety   . Carpal tunnel syndrome    right  . Depression   . Depression   . Erectile dysfunction   . Fatigue   . GERD (gastroesophageal reflux disease)   . Gout   . Hypertension   . HYPOGONADISM 03/31/2008   Qualifier: Diagnosis of  By: Jenny Reichmann MD, Hunt Oris   . IBS (irritable bowel syndrome)   . Low back  pain   . OSA (obstructive sleep apnea)   . Paraplegia ([place]) 04/23/2009   Qualifier: Diagnosis of  By: Jenny Reichmann MD, Hunt Oris   . Peptic ulcer disease   . PEPTIC ULCER DISEASE 10/01/2007   Qualifier: Diagnosis of  By: Jenny Reichmann MD, Hunt Oris   . Prostate cancer [facility])   . PROSTATE CANCER, HX OF 05/23/2007   Qualifier: Diagnosis of  By: Jenny Reichmann MD, [place] PARALYSIS 04/23/2009   Qualifier: Diagnosis of  By: Jenny Reichmann MD, Hunt Oris   . Thoracic spinal cord injury ([place]) 03/12/2012    Past Surgical History:  Procedure Laterality Date  . ARTERY AND TENDON REPAIR Left 09/06/2018   Procedure: ARTERY AND TENDON REPAIR;  Surgeon: Charlotte Crumb, MD;  Location: [place];  Service: Orthopedics;  Laterality: Left;  . CAST APPLICATION Left [id number]   Procedure: CAST APPLICATION;  Surgeon: Charlotte Crumb, MD;  Location: [place];  Service: Orthopedics;  Laterality: Left;  . inguinal heniorrhaphy    . left leg surgery after fibula    . NERVE REPAIR Left 09/06/2018   Procedure: NERVE REPAIR TIMES TWO;  Surgeon: Charlotte Crumb, MD;  Location: [place];  Service: Orthopedics;  Laterality: Left;  . OPEN REDUCTION INTERNAL FIXATION (ORIF) DISTAL PHALANX Left 09/06/2018   Procedure: OPEN REDUCTION INTERNAL FIXATION (ORIF) LEFT LONG FINGER;  Surgeon: Burney Gauze,  Rodman Key, MD;  Location: [place];  Service: Orthopedics;  Laterality: Left;  . PILONIDAL CYST / SINUS EXCISION    . PROSTATECTOMY    . tonsillectomy    . WOUND EXPLORATION Left 09/06/2018   Procedure: EXPLORATION OFCOMPLEX INJURY;  Surgeon: Charlotte Crumb, MD;  Location: [place];  Service: Orthopedics;  Laterality: Left;     reports that he has never smoked. He has never used smokeless tobacco. He reports that he does not drink alcohol or use drugs.  Allergies  Allergen Reactions  . Amoxicillin Other (See Comments)  . Endal Hd Other (See Comments)    Family History  Problem Relation Age of Onset  . High blood pressure Mother   . Dementia Mother   . Diabetes  Father      Prior to Admission medications   Medication Sig Start Date End Date Taking? Authorizing Provider  antiseptic oral rinse (BIOTENE) LIQD 15 mLs by Mouth Rinse route as needed for dry mouth.    [provider]  aspirin (ECOTRIN LOW STRENGTH) 81 MG EC tablet Take 81 mg by mouth daily.      [provider]  baclofen (LIORESAL) 10 MG tablet Take 1 tablet (10 mg total) by mouth 4 (four) times daily. Taking additional 10mg  tablet (1/2 tab in 0500 and 1700). 09/11/19   Marcial Pacas, MD  baclofen (LIORESAL) 20 MG tablet Take 1 tablet (20 mg total) by mouth 4 (four) times daily. 09/11/19   Marcial Pacas, MD  CALCIUM-MAGNESIUM PO Take 1 tablet by mouth daily.     [provider]  clonazePAM (KLONOPIN) 2 MG tablet Take 2 tablets (4 mg total) by mouth at bedtime. 09/10/19   Meredith Staggers, MD  losartan (COZAAR) 50 MG tablet Take 1 tablet (50 mg total) by mouth 2 (two) times daily. 07/02/19   Biagio Borg, MD  OVER THE COUNTER MEDICATION Take 22.5 mLs by mouth daily. Takes 1.5 tbsp  Mesotrace MV powder    [provider]  oxybutynin (DITROPAN) 5 MG tablet Take 5 mg by mouth 4 (four) times daily.     [provider]  tiZANidine (ZANAFLEX) 4 MG tablet take 1-2 tablets by mouth four times a day 01/29/19   Meredith Staggers, MD  vitamin C (ASCORBIC ACID) 500 MG tablet Take 500 mg by mouth daily.    [provider]  vitamin E (VITAMIN E) 400 UNIT capsule Take 800 Units by mouth daily.    [provider]    Physical Exam: Vitals:   09/15/19 2145 09/15/19 2230 09/15/19 2300 09/15/19 2315  BP: 119/73 115/68 91/62 108/68  Pulse:  98 89 95  Resp: 20 (!) 21 16 17   Temp:      TempSrc:      SpO2:  97% 97% 99%    Constitutional: NAD, calm, comfortable Eyes: PERRL, lids and conjunctivae normal ENMT: Mucous membranes are moist. Posterior pharynx clear of any exudate or lesions.Normal dentition.  Neck: normal, supple, no masses, no thyromegaly  Respiratory: clear to auscultation bilaterally, no wheezing, no crackles. Normal respiratory effort. No accessory muscle use.  Cardiovascular: Regular rate and rhythm, no murmurs / rubs / gallops. No extremity edema. 2+ pedal pulses. No carotid bruits.  Abdomen: no tenderness, no masses palpated. No hepatosplenomegaly. Bowel sounds positive.  Musculoskeletal: no clubbing / cyanosis. No joint deformity upper and lower extremities. Good ROM, no contractures. Normal muscle tone.  Skin: no rashes, lesions, ulcers. No induration Neurologic: Obeying commands BUE (spastic paraplegia of  BLE), no verbal response, eye opening to voice. Psychiatric: Normal judgment and insight. Alert and oriented x 3. Normal mood.    Labs on Admission: I have personally reviewed following labs and imaging studies  CBC: Recent Labs  Lab 09/15/19 2121  WBC 13.5*  NEUTROABS 11.5*  HGB 14.2  HCT 43.9  MCV 90.3  PLT 0000000   Basic Metabolic Panel: Recent Labs  Lab 09/15/19 2121  NA 138  K 3.3*  CL 104  CO2 23  GLUCOSE 158*  BUN 26*  CREATININE 1.43*  CALCIUM 8.7*   GFR: Estimated Creatinine Clearance: 50.6 mL/min (A) (by C-G formula based on SCr of 1.43 mg/dL (H)). Liver Function Tests: Recent Labs  Lab 09/15/19 2121  AST 59*  ALT 24  ALKPHOS 70  BILITOT 1.1  PROT 6.1*  ALBUMIN 3.1*   No results for input(s): LIPASE, AMYLASE in the last 168 hours. No results for input(s): AMMONIA in the last 168 hours. Coagulation Profile: No results for input(s): INR, PROTIME in the last 168 hours. Cardiac Enzymes: No results for input(s): CKTOTAL, CKMB, CKMBINDEX, TROPONINI in the last 168 hours. BNP (last 3 results) No results for input(s): PROBNP in the last 8760 hours. HbA1C: No results for input(s): HGBA1C in the last 72 hours. CBG: No results for input(s): GLUCAP in the last 168 hours. Lipid Profile: No results for input(s): CHOL, HDL, LDLCALC, TRIG, CHOLHDL, LDLDIRECT in the last 72 hours. Thyroid  Function Tests: No results for input(s): TSH, T4TOTAL, FREET4, T3FREE, THYROIDAB in the last 72 hours. Anemia Panel: No results for input(s): VITAMINB12, FOLATE, FERRITIN, TIBC, IRON, RETICCTPCT in the last 72 hours. Urine analysis:    Component Value Date/Time   COLORURINE YELLOW 01/01/2019 1529   APPEARANCEUR CLEAR 01/01/2019 1529   LABSPEC 1.015 01/01/2019 1529   PHURINE 6.5 01/01/2019 1529   GLUCOSEU NEGATIVE 01/01/2019 1529   HGBUR NEGATIVE 01/01/2019 1529   BILIRUBINUR NEGATIVE 01/01/2019 1529   KETONESUR NEGATIVE 01/01/2019 1529   PROTEINUR NEGATIVE 08/25/2010 1751   UROBILINOGEN 0.2 01/01/2019 1529   NITRITE NEGATIVE 01/01/2019 1529   LEUKOCYTESUR NEGATIVE 01/01/2019 1529    Radiological Exams on Admission: Ct Head Wo Contrast  Result Date: 09/15/2019 CLINICAL DATA:  New onset seizure EXAM: CT HEAD WITHOUT CONTRAST TECHNIQUE: Contiguous axial images were obtained from the base of the skull through the vertex without intravenous contrast. COMPARISON:  None. FINDINGS: The examination is severely motion degraded. There is no midline shift. There is no visible, large extra-axial collection. No depressed skull fracture. IMPRESSION: Severely motion degraded examination.  No visible acute abnormality Electronically Signed   By: Ulyses Jarred M.D.   On: 09/15/2019 22:15    EKG: Independently reviewed.  Assessment/Plan Principal Problem:   Acute metabolic encephalopathy Active Problems:   Paraplegia ([place])   Spinal cord injury, thoracic region [facility])   New onset seizure ([place])   Post-ictal state ([place])    1. Acute metabolic encephalopathy - 1. Partially post-ictal state 2. But sounds like he had AMS on top of this as well throughout the day today before seizure this evening. 3. Also sounds like he may have had seizure while asleep last night. 4. UA pending 5. Bladder scan pending, if positive then needs foley 6. Suspect baclofen OD due to AKI 1. Per Dr. Leonel Ramsay: 2.  Reduce baclofen dose to 15mg  QID scheduled (lower dose due to renal function, dont want to stop completely and send him into baclofen withdrawal however) 3. Continue tizanidine but at 4mg  QID scheduled 4.  Use ativan 1mg  Q6H scheduled until he can take POs again instead of klonopin 5. Keppra 500mg  BID (ill put this as IV for now) 6. LP once MRI has cleared L spine. 7. Pt starting to wake up 8. If not able to take POs by tomorrow AM then needs NGT to prevent baclofen withdrawal 9. MRI brain and MR L spine pending 2. Paraplegia - 1. T10 paraplegia from spinal cord tumor removal x9 years ago, chronic 3. New onset seizure - 1. MRI pending 2. Keppra 500mg  BID 4. AKI - 1. Bladder scan pending 2. Repeat BMP in AM 3. Hold losartan 4. LR at 75 5. Strict intake and output 5. Mild hypokalemia - 1. Replace K  DVT prophylaxis: Lovenox Code Status: Full Family Communication: Wife at bedside Disposition Plan: Home after admit Consults called: Neurology Admission status: Place in 24    [place], [place] Hospitalists  How to contact the [facility] Attending or Consulting provider [place] or covering provider during after hours [place], for this patient?  1. Check the care team in [facility] and look for a) attending/consulting [facility] provider listed and b) the [facility] team listed 2. Log into [email or web address]  Amion Physician Scheduling and messaging for groups and whole hospitals  On call and physician scheduling software for group practices, residents, hospitalists and other medical providers for call, clinic, rotation and shift schedules. OnCall [employer] is a hospital-wide system for scheduling doctors and paging doctors on call. EasyPlot is for scientific plotting and data analysis.  [email or web address]  and use [place]'s universal password to access. If you do not have the password, please contact the hospital operator.  3. Locate the [facility] provider you are looking for under [facility] and page to  a number that you can be directly reached. 4. If you still have difficulty reaching the provider, please page the [facility] (Director on Call) for the Hospitalists listed on amion for assistance.  09/15/2019, 11:58 PM

## 2019-09-16 ENCOUNTER — Observation Stay (HOSPITAL_COMMUNITY): Payer: Medicare Other

## 2019-09-16 ENCOUNTER — Encounter (HOSPITAL_COMMUNITY): Payer: Self-pay | Admitting: *Deleted

## 2019-09-16 ENCOUNTER — Other Ambulatory Visit: Payer: Self-pay

## 2019-09-16 DIAGNOSIS — G9340 Encephalopathy, unspecified: Secondary | ICD-10-CM

## 2019-09-16 DIAGNOSIS — Z85841 Personal history of malignant neoplasm of brain: Secondary | ICD-10-CM | POA: Diagnosis not present

## 2019-09-16 DIAGNOSIS — E876 Hypokalemia: Secondary | ICD-10-CM | POA: Diagnosis present

## 2019-09-16 DIAGNOSIS — R410 Disorientation, unspecified: Secondary | ICD-10-CM | POA: Diagnosis not present

## 2019-09-16 DIAGNOSIS — F419 Anxiety disorder, unspecified: Secondary | ICD-10-CM | POA: Diagnosis present

## 2019-09-16 DIAGNOSIS — I10 Essential (primary) hypertension: Secondary | ICD-10-CM | POA: Diagnosis present

## 2019-09-16 DIAGNOSIS — X58XXXD Exposure to other specified factors, subsequent encounter: Secondary | ICD-10-CM | POA: Diagnosis present

## 2019-09-16 DIAGNOSIS — G928 Other toxic encephalopathy: Secondary | ICD-10-CM | POA: Diagnosis present

## 2019-09-16 DIAGNOSIS — G8220 Paraplegia, unspecified: Secondary | ICD-10-CM | POA: Diagnosis not present

## 2019-09-16 DIAGNOSIS — G8114 Spastic hemiplegia affecting left nondominant side: Secondary | ICD-10-CM | POA: Diagnosis present

## 2019-09-16 DIAGNOSIS — K589 Irritable bowel syndrome without diarrhea: Secondary | ICD-10-CM | POA: Diagnosis present

## 2019-09-16 DIAGNOSIS — K5900 Constipation, unspecified: Secondary | ICD-10-CM | POA: Diagnosis present

## 2019-09-16 DIAGNOSIS — K219 Gastro-esophageal reflux disease without esophagitis: Secondary | ICD-10-CM | POA: Diagnosis present

## 2019-09-16 DIAGNOSIS — K521 Toxic gastroenteritis and colitis: Secondary | ICD-10-CM | POA: Diagnosis not present

## 2019-09-16 DIAGNOSIS — R252 Cramp and spasm: Secondary | ICD-10-CM | POA: Diagnosis not present

## 2019-09-16 DIAGNOSIS — M4714 Other spondylosis with myelopathy, thoracic region: Secondary | ICD-10-CM | POA: Diagnosis not present

## 2019-09-16 DIAGNOSIS — G4733 Obstructive sleep apnea (adult) (pediatric): Secondary | ICD-10-CM | POA: Diagnosis present

## 2019-09-16 DIAGNOSIS — F329 Major depressive disorder, single episode, unspecified: Secondary | ICD-10-CM | POA: Diagnosis present

## 2019-09-16 DIAGNOSIS — Y92239 Unspecified place in hospital as the place of occurrence of the external cause: Secondary | ICD-10-CM | POA: Diagnosis not present

## 2019-09-16 DIAGNOSIS — Z8546 Personal history of malignant neoplasm of prostate: Secondary | ICD-10-CM | POA: Diagnosis not present

## 2019-09-16 DIAGNOSIS — Z888 Allergy status to other drugs, medicaments and biological substances status: Secondary | ICD-10-CM | POA: Diagnosis not present

## 2019-09-16 DIAGNOSIS — G8110 Spastic hemiplegia affecting unspecified side: Secondary | ICD-10-CM | POA: Diagnosis present

## 2019-09-16 DIAGNOSIS — G9341 Metabolic encephalopathy: Secondary | ICD-10-CM | POA: Diagnosis not present

## 2019-09-16 DIAGNOSIS — T428X5A Adverse effect of antiparkinsonism drugs and other central muscle-tone depressants, initial encounter: Secondary | ICD-10-CM | POA: Diagnosis present

## 2019-09-16 DIAGNOSIS — N390 Urinary tract infection, site not specified: Secondary | ICD-10-CM | POA: Diagnosis not present

## 2019-09-16 DIAGNOSIS — N179 Acute kidney failure, unspecified: Secondary | ICD-10-CM | POA: Diagnosis present

## 2019-09-16 DIAGNOSIS — G92 Toxic encephalopathy: Secondary | ICD-10-CM | POA: Diagnosis not present

## 2019-09-16 DIAGNOSIS — R569 Unspecified convulsions: Secondary | ICD-10-CM | POA: Diagnosis not present

## 2019-09-16 DIAGNOSIS — J309 Allergic rhinitis, unspecified: Secondary | ICD-10-CM | POA: Diagnosis present

## 2019-09-16 DIAGNOSIS — A499 Bacterial infection, unspecified: Secondary | ICD-10-CM | POA: Diagnosis not present

## 2019-09-16 DIAGNOSIS — T428X5D Adverse effect of antiparkinsonism drugs and other central muscle-tone depressants, subsequent encounter: Secondary | ICD-10-CM | POA: Diagnosis not present

## 2019-09-16 DIAGNOSIS — F22 Delusional disorders: Secondary | ICD-10-CM | POA: Diagnosis present

## 2019-09-16 DIAGNOSIS — M109 Gout, unspecified: Secondary | ICD-10-CM | POA: Diagnosis present

## 2019-09-16 DIAGNOSIS — N319 Neuromuscular dysfunction of bladder, unspecified: Secondary | ICD-10-CM | POA: Diagnosis present

## 2019-09-16 DIAGNOSIS — N529 Male erectile dysfunction, unspecified: Secondary | ICD-10-CM | POA: Diagnosis present

## 2019-09-16 DIAGNOSIS — Z880 Allergy status to penicillin: Secondary | ICD-10-CM | POA: Diagnosis not present

## 2019-09-16 DIAGNOSIS — T428X1A Poisoning by antiparkinsonism drugs and other central muscle-tone depressants, accidental (unintentional), initial encounter: Secondary | ICD-10-CM | POA: Diagnosis not present

## 2019-09-16 DIAGNOSIS — Z8711 Personal history of peptic ulcer disease: Secondary | ICD-10-CM | POA: Diagnosis not present

## 2019-09-16 DIAGNOSIS — Z79899 Other long term (current) drug therapy: Secondary | ICD-10-CM | POA: Diagnosis not present

## 2019-09-16 DIAGNOSIS — N289 Disorder of kidney and ureter, unspecified: Secondary | ICD-10-CM | POA: Diagnosis not present

## 2019-09-16 DIAGNOSIS — G959 Disease of spinal cord, unspecified: Secondary | ICD-10-CM | POA: Diagnosis present

## 2019-09-16 DIAGNOSIS — Z833 Family history of diabetes mellitus: Secondary | ICD-10-CM | POA: Diagnosis not present

## 2019-09-16 DIAGNOSIS — K592 Neurogenic bowel, not elsewhere classified: Secondary | ICD-10-CM | POA: Diagnosis present

## 2019-09-16 DIAGNOSIS — R5381 Other malaise: Secondary | ICD-10-CM | POA: Diagnosis present

## 2019-09-16 DIAGNOSIS — B961 Klebsiella pneumoniae [K. pneumoniae] as the cause of diseases classified elsewhere: Secondary | ICD-10-CM | POA: Diagnosis present

## 2019-09-16 DIAGNOSIS — Z20828 Contact with and (suspected) exposure to other viral communicable diseases: Secondary | ICD-10-CM | POA: Diagnosis present

## 2019-09-16 LAB — URINALYSIS, ROUTINE W REFLEX MICROSCOPIC
Bilirubin Urine: NEGATIVE
Glucose, UA: 50 mg/dL — AB
Ketones, ur: 5 mg/dL — AB
Nitrite: POSITIVE — AB
Protein, ur: 30 mg/dL — AB
RBC / HPF: >50 RBC/hpf — ABNORMAL HIGH (ref 0–5)
Specific Gravity, Urine: 1.015 (ref 1.005–1.030)
WBC, UA: >50 WBC/hpf — ABNORMAL HIGH (ref 0–5)
pH: 5 (ref 5.0–8.0)

## 2019-09-16 LAB — BASIC METABOLIC PANEL
Anion gap: 7 (ref 5–15)
BUN: 23 mg/dL (ref 8–23)
CO2: 23 mmol/L (ref 22–32)
Calcium: 8.2 mg/dL — ABNORMAL LOW (ref 8.9–10.3)
Chloride: 107 mmol/L (ref 98–111)
Creatinine, Ser: 1.05 mg/dL (ref 0.61–1.24)
GFR calc Af Amer: >60 mL/min (ref >60)
GFR calc non Af Amer: >60 mL/min (ref >60)
Glucose, Bld: 118 mg/dL — ABNORMAL HIGH (ref 70–99)
Potassium: 3.9 mmol/L (ref 3.5–5.1)
Sodium: 137 mmol/L (ref 135–145)

## 2019-09-16 LAB — LACTIC ACID, PLASMA: Lactic Acid, Venous: 0.9 mmol/L (ref 0.5–1.9)

## 2019-09-16 LAB — SARS CORONAVIRUS 2 (TAT 6-24 HRS): SARS Coronavirus 2: NEGATIVE

## 2019-09-16 MED ORDER — CHLORHEXIDINE GLUCONATE CLOTH 2 % EX PADS
6.0000 | MEDICATED_PAD | Freq: Every day | CUTANEOUS | Status: DC
  Administered 2019-09-16 – 2019-09-22 (×7): 6 via TOPICAL

## 2019-09-16 MED ORDER — POTASSIUM CHLORIDE 10 MEQ/100ML IV SOLN
10.0000 meq | INTRAVENOUS | Status: AC
  Administered 2019-09-16 (×2): 10 meq via INTRAVENOUS
  Filled 2019-09-16 (×2): qty 100

## 2019-09-16 MED ORDER — LACTATED RINGERS IV SOLN
INTRAVENOUS | Status: DC
  Administered 2019-09-16 – 2019-09-17 (×3): via INTRAVENOUS

## 2019-09-16 MED ORDER — GADOBUTROL 1 MMOL/ML IV SOLN
8.5000 mL | Freq: Once | INTRAVENOUS | Status: AC | PRN
  Administered 2019-09-16: 03:00:00 8.5 mL via INTRAVENOUS

## 2019-09-16 MED ORDER — SODIUM CHLORIDE 0.9 % IV BOLUS: 500.0000 mL | Freq: Once | INTRAVENOUS | Status: AC

## 2019-09-16 MED ORDER — SODIUM CHLORIDE 0.9 % IV SOLN
1.0000 g | INTRAVENOUS | Status: DC
  Administered 2019-09-16 – 2019-09-22 (×6): 1 g via INTRAVENOUS
  Filled 2019-09-16 (×6): qty 10

## 2019-09-16 MED ORDER — INFLUENZA VAC A&B SA ADJ QUAD 0.5 ML IM PRSY
0.5000 mL | PREFILLED_SYRINGE | INTRAMUSCULAR | Status: DC
  Filled 2019-09-16: qty 0.5

## 2019-09-16 NOTE — Evaluation (Signed)
Clinical/Bedside Swallow Evaluation Patient Details  Name: David Kim MRN: [id number] Date of Birth: 1946/09/20  Today's Date: 09/16/2019 Time: SLP Start Time (ACUTE ONLY): 1440 SLP Stop Time (ACUTE ONLY): 1451 SLP Time Calculation (min) (ACUTE ONLY): 11 min  Past Medical History:  Past Medical History:  Diagnosis Date  . Allergic rhinitis   . ALLERGIC RHINITIS 10/01/2007   Qualifier: Diagnosis of  By: Jenny Reichmann MD, Hunt Oris   . Anxiety   . Carpal tunnel syndrome    right  . Depression   . Depression   . Erectile dysfunction   . Fatigue   . GERD (gastroesophageal reflux disease)   . Gout   . Hypertension   . HYPOGONADISM 03/31/2008   Qualifier: Diagnosis of  By: Jenny Reichmann MD, Hunt Oris   . IBS (irritable bowel syndrome)   . Low back pain   . OSA (obstructive sleep apnea)   . Paraplegia ([place]) 04/23/2009   Qualifier: Diagnosis of  By: Jenny Reichmann MD, Hunt Oris   . Peptic ulcer disease   . PEPTIC ULCER DISEASE 10/01/2007   Qualifier: Diagnosis of  By: Jenny Reichmann MD, Hunt Oris   . Prostate cancer [facility])   . PROSTATE CANCER, HX OF 05/23/2007   Qualifier: Diagnosis of  By: Jenny Reichmann MD, [place] PARALYSIS 04/23/2009   Qualifier: Diagnosis of  By: Jenny Reichmann MD, Hunt Oris   . Thoracic spinal cord injury ([place]) 03/12/2012   Past Surgical History:  Past Surgical History:  Procedure Laterality Date  . ARTERY AND TENDON REPAIR Left 09/06/2018   Procedure: ARTERY AND TENDON REPAIR;  Surgeon: Charlotte Crumb, MD;  Location: [place];  Service: Orthopedics;  Laterality: Left;  . CAST APPLICATION Left [id number]   Procedure: CAST APPLICATION;  Surgeon: Charlotte Crumb, MD;  Location: [place];  Service: Orthopedics;  Laterality: Left;  . inguinal heniorrhaphy    . left leg surgery after fibula    . NERVE REPAIR Left 09/06/2018   Procedure: NERVE REPAIR TIMES TWO;  Surgeon: Charlotte Crumb, MD;  Location: [place];  Service: Orthopedics;  Laterality: Left;  . OPEN REDUCTION INTERNAL FIXATION (ORIF) DISTAL PHALANX Left  09/06/2018   Procedure: OPEN REDUCTION INTERNAL FIXATION (ORIF) LEFT LONG FINGER;  Surgeon: Charlotte Crumb, MD;  Location: [place];  Service: Orthopedics;  Laterality: Left;  . PILONIDAL CYST / SINUS EXCISION    . PROSTATECTOMY    . tonsillectomy    . WOUND EXPLORATION Left 09/06/2018   Procedure: EXPLORATION OFCOMPLEX INJURY;  Surgeon: Charlotte Crumb, MD;  Location: [place];  Service: Orthopedics;  Laterality: Left;   HPI:  73 y.o. male with a history of paraplegia due to spinal tumor was admitted with new onset seizure 09/15/19.  Had mild confusion and increasing lethargy several days PTA in setting of large baclofen dose; AKI, UTI; OSA on CPAP.    Assessment / Plan / Recommendation Clinical Impression  Pt continues to be groggy, inconsistently following commands and there is limited spontaneous speech.  His wife is at bedside.  Presents with functional swallow with no indications of aspiration.  No regular solids were offered given his lethargy.  With verbal prompting, he consumed thin liquids from a straw with no s/s of aspiration and had several bites of applesauce with adequate oral attention/mastication. Required frequent verbal/tactile cueing to maintain sufficient wakefulness.  Recommend beginning a full liquid diet until MS improves.  Wife agrees.     SLP Visit Diagnosis: Dysphagia, unspecified (R13.10)    Aspiration Risk  Mild  aspiration risk    Diet Recommendation   full liquids  Medication Administration: Whole meds with liquid    Other  Recommendations Oral Care Recommendations: Oral care BID   Follow up Recommendations None      Frequency and Duration min 1 x/week  1 week       Prognosis        Swallow Study   General Date of Onset: 09/15/19 HPI: 73 y.o. male with a history of paraplegia due to spinal tumor was admitted with new onset seizure 09/15/19.  Had mild confusion and increasing lethargy several days PTA in setting of large baclofen dose; AKI, UTI; OSA on  CPAP.  Type of Study: Bedside Swallow Evaluation Previous Swallow Assessment: no Diet Prior to this Study: NPO Temperature Spikes Noted: No Respiratory Status: Nasal cannula History of Recent Intubation: No Behavior/Cognition: Lethargic/Drowsy Oral Cavity Assessment: Other (comment)(ulceration right tongue) Oral Care Completed by SLP: No Oral Cavity - Dentition: Adequate natural dentition Vision: Functional for self-feeding Self-Feeding Abilities: Needs assist Patient Positioning: Upright in bed Baseline Vocal Quality: Normal Volitional Cough: Strong Volitional Swallow: Able to elicit    Oral/Motor/Sensory Function Overall Oral Motor/Sensory Function: Within functional limits   Ice Chips Ice chips: Within functional limits   Thin Liquid Thin Liquid: Within functional limits    Nectar Thick Nectar Thick Liquid: Not tested   Honey Thick Honey Thick Liquid: Not tested   Puree Puree: Within functional limits   Solid     Solid: Not tested      [name] 09/16/2019,4:14 PM  Estill Bamberg L. Tivis Ringer, [place] Office number [phone] Pager [phone]

## 2019-09-16 NOTE — Consult Note (Addendum)
Neurology Consultation Reason for Consult: Seizure Referring Physician: Sharee Pimple  CC: Seizure  History is obtained from: Patient  HPI: David Kim is a 73 y.o. male with a history of paraplegia due to spinal tumor who had a new onset seizure tonight.  Yesterday and today, he has been slow to respond, not acting his "normal self."  With talking with his wife, it sounds like he may have even had some trouble with his memory over the past week.  Tonight, he had a seizure which was described by his wife as generalized convulsive activity.  He has since improved, the point where he is following commands, though still lethargic and somewhat confused.  His wife denies recent illness or changes in urination that she is aware of.  She does know that he has not missed any doses of his medication because he does use a laid out pill planner.  She is not certain of the exact doses but thinks they are the same as what we have on file here.  He recently saw Dr. Krista Blue who had decreased his baclofen to 30 mg 4 times daily from 35 mg 4 times daily, but it does not sound like he had implemented this change yet.  ROS:   Unable to obtain due to altered mental status.   Past Medical History:  Diagnosis Date  . Allergic rhinitis   . ALLERGIC RHINITIS 10/01/2007   Qualifier: Diagnosis of  By: Jenny Reichmann MD, Hunt Oris   . Anxiety   . Carpal tunnel syndrome    right  . Depression   . Depression   . Erectile dysfunction   . Fatigue   . GERD (gastroesophageal reflux disease)   . Gout   . Hypertension   . HYPOGONADISM 03/31/2008   Qualifier: Diagnosis of  By: Jenny Reichmann MD, Hunt Oris   . IBS (irritable bowel syndrome)   . Low back pain   . OSA (obstructive sleep apnea)   . Paraplegia ([place]) 04/23/2009   Qualifier: Diagnosis of  By: Jenny Reichmann MD, Hunt Oris   . Peptic ulcer disease   . PEPTIC ULCER DISEASE 10/01/2007   Qualifier: Diagnosis of  By: Jenny Reichmann MD, Hunt Oris   . Prostate cancer [facility])   . PROSTATE CANCER, HX OF  05/23/2007   Qualifier: Diagnosis of  By: Jenny Reichmann MD, [place] PARALYSIS 04/23/2009   Qualifier: Diagnosis of  By: Jenny Reichmann MD, Hunt Oris   . Thoracic spinal cord injury ([place]) 03/12/2012     Family History  Problem Relation Age of Onset  . High blood pressure Mother   . Dementia Mother   . Diabetes Father      Social History:  reports that he has never smoked. He has never used smokeless tobacco. He reports that he does not drink alcohol or use drugs.   Exam: Current vital signs: BP 108/68   Pulse 95   Temp 99.7 F (37.6 C) (Rectal)   Resp 17   SpO2 99%  Vital signs in last 24 hours: Temp:  [99.5 F (37.5 C)-99.7 F (37.6 C)] 99.7 F (37.6 C) (11/10 0008) Pulse Rate:  [89-111] 95 (11/09 2315) Resp:  [16-21] 17 (11/09 2315) BP: (91-121)/(62-73) 108/68 (11/09 2315) SpO2:  [95 %-99 %] 99 % (11/09 2315)   Physical Exam  Constitutional: Appears well-developed and well-nourished.  Psych: Affect appropriate to situation Eyes: No scleral injection HENT: No OP obstrucion MSK: no joint deformities.  Cardiovascular: Normal rate and regular rhythm.  Respiratory:  Effort normal, non-labored breathing GI: Soft.  No distension. There is no tenderness.  Skin: WDI on anterior inspection  Neuro: Mental Status: Patient is lethargic but arousable. He is able to tell me his name and identify his wife. He answers with only one word answers.  Cranial Nerves: II: Visual Fields are full. Pupils are equal, round, and reactive to light.   III,IV, VI: EOMI without ptosis or diploplia.  V: Facial sensation is symmetric to touch VII: Facial movement is symmetric.  XII: does not fully protrude tongue.  Motor: No voluntary movement of LE, follows commands in bilateral UE with symmetric strength.   Sensory: Responds to stimulation bilaterally.  Deep Tendon Reflexes: Triple flexion of bilateral lower extremities Cerebellar: Does not perform.   I have reviewed labs in epic and the  results pertinent to this consultation are: UA suggestive on infeciton.  CMP - Cr 1.43(eGFR 49), baseline appears to be less than one.    I have reviewed the images obtained:CT head - negative  Impression: 73 year old male with 1 week of mild confusion followed by 2 days of increasing lethargy in the setting of a very large baclofen dose with AKI and UTI.  I suspect that this may be baclofen toxicity due to decreased renal clearance.  I do think that an MRI of his brain will be reasonable.  He does have a little bit of a white count, but is currently afebrile.  If he does spike a fever then LP may be indicated, but I have relatively low suspicion for CNS infection at this time.  Though unlikely to need prolonged AED therapy, will start keppra pending workup.  Recommendations: 1) MRI brain 2) EEG 3) Keppra 571m BID 4) treatment of AKI/UTI per IM.  5) reduce baclofen dose to 173mQID 6) continue tizanidine 7) continue klonopin, or substituting ativan for the time being.  8) Neurology will follow.    McRoland RackMD [facility] [phone] 7pm- 7am, please page neurology on call as listed in [facility]

## 2019-09-16 NOTE — Procedures (Signed)
Patient Name: David Kim  MRN: [id number]  Epilepsy Attending: Lora Havens  Referring Physician/Provider: Dr Kathrynn Speed Date: 09/16/2019 Duration: 25 minutes  Patient history: 73 year old male with altered mental status.  EEG to evaluate for seizures.  Level of alertness: Awake/lethargic  AEDs during EEG study: Lorazepam, Keppra  Technical aspects: This EEG study was done with scalp electrodes positioned according to the 10-20 International system of electrode placement. Electrical activity was acquired at a sampling rate of 500Hz  and reviewed with a high frequency filter of 70Hz  and a low frequency filter of 1Hz . EEG data were recorded continuously and digitally stored.   Description: EEG showed continuous generalized 3 to 5 Hz theta-delta slowing as well as intermittent generalized 2 to 3 Hz rhythmic delta slowing.  No clear posterior dominant rhythm was seen.  Hyperventilation and photic stimulation were not performed.    Abnormality -Continuous slow, generalized -Intermittent rhythmic slow, generalized  IMPRESSION: This study is suggestive of moderate diffuse encephalopathy, nonspecific to etiology. No seizures or epileptiform discharges were seen throughout the recording.

## 2019-09-16 NOTE — Progress Notes (Addendum)
NEUROLOGY PROGRESS NOTE  Subjective: Patient remains very drowsy.  After waking secondary to vocal stimuli.  Upon asking questions he responds as "[name] Biden " "hostile [place] "and states that he can "wiggle my right toe ".  Exam: Vitals:   09/16/19 0600 09/16/19 0850  BP:  128/79  Pulse: 80 83  Resp: 18 (!) 23  Temp:  98.6 F (37 C)  SpO2: 100% 99%    ROS -Due to patient's significant drowsiness I was unable to obtain a review of systems  Physical Exam  Constitutional: Appears well-developed and well-nourished.  Psych: Very lethargic Eyes: No scleral injection HENT: No OP obstrucion Head: Normocephalic.  Cardiovascular: Normal rate and regular rhythm.  Respiratory: Effort normal, non-labored breathing GI: Soft.  No distension.  Skin: WDI Oral mucosa : Dry  Neuro:  Mental Status: Awakens to verbal stimuli however remains very drowsy.  When asked where he is or the month he answers as above.  He is able to follow simple commands such as squeezing my hand, raising his arms, smiling and counting fingers by showing me with his fingers.  Continues to fall asleep if not stimulated Cranial Nerves: II:  Visual fields grossly normal,  III,IV, VI: ptosis not present, extra-ocular motions intact bilaterally pupils equal, round, reactive to light and accommodation V,VII: smile symmetric, VIII: hearing normal bilaterally XII: midline tongue extension Motor: Right : Upper extremity   5/5    Left:     Upper extremity   5/5 Bilateral lower extremities have significant increased tone.  He is moving his left leg greater than his right leg. Tone and bulk:normal tone throughout; no atrophy noted Sensory: Positive noxious stimuli bilateral upper extremities and withdraws to bilateral noxious stimuli with lower extremities but to a lesser extent Deep Tendon Reflexes: 1+ throughout upper extremities.  Due to spasticity difficult to get lower extremities however I do not feel he had knee jerks.   I did not get ankle jerks. Plantars: Right: downgoing   Left: downgoing Cerebellar: normal finger-to-nose--but very slow    Medications:  Scheduled: . aspirin EC  81 mg Oral Daily  . baclofen  15 mg Oral TID AC & HS  . enoxaparin (LOVENOX) injection  40 mg Subcutaneous Daily  . LORazepam  1 mg Intravenous Q6H  . oxybutynin  5 mg Oral QID  . tiZANidine  4 mg Oral Q6H  . vitamin C  500 mg Oral Daily  . vitamin E  800 Units Oral Daily    Pertinent Labs/Diagnostics: GFR greater than 60 UA shows positive nitrate, large amount of leukocytes, greater than 50 white blood cells, rare bacteria Urine culture pending  Ct Head Wo Contrast  Result Date: 09/15/2019 CLINICAL DATA:  New onset seizure EXAM: CT HEAD WITHOUT CONTRAST TECHNIQUE: Contiguous axial images were obtained from the base of the skull through the vertex without intravenous contrast. COMPARISON:  None. FINDINGS: The examination is severely motion degraded. There is no midline shift. There is no visible, large extra-axial collection. No depressed skull fracture. IMPRESSION: Severely motion degraded examination.  No visible acute abnormality Electronically Signed   By: Ulyses Jarred M.D.   On: 09/15/2019 22:15   Mr Brain Wo Contrast  Result Date: 09/16/2019 CLINICAL DATA:  Confusion and seizure activity. EXAM: MRI HEAD WITHOUT CONTRAST TECHNIQUE: Multiplanar, multiecho pulse sequences of the brain and surrounding structures were obtained without intravenous contrast. COMPARISON:  None. FINDINGS: BRAIN: There is no acute infarct, acute hemorrhage or extra-axial collection. The white matter signal is normal  for the patient's age. The cerebral and cerebellar volume are age-appropriate. There is no hydrocephalus. The midline structures are normal. VASCULAR: The major intracranial arterial and venous sinus flow voids are normal. Susceptibility-sensitive sequences show no chronic microhemorrhage or superficial siderosis. SKULL AND UPPER  CERVICAL SPINE: Calvarial bone marrow signal is normal. There is no skull base mass. The visualized upper cervical spine and soft tissues are normal. SINUSES/ORBITS: There are no fluid levels or advanced mucosal thickening. The mastoid air cells and middle ear cavities are free of fluid. The orbits are normal. IMPRESSION: Normal aging brain. Electronically Signed   By: Ulyses Jarred M.D.   On: 09/16/2019 02:29   Mr Lumbar Spine W Wo Contrast  Result Date: 09/16/2019 CLINICAL DATA:  Confusion and seizure activity. History of lower thoracic ependymoma. EXAM: MRI LUMBAR SPINE WITHOUT AND WITH CONTRAST TECHNIQUE: Multiplanar and multiecho pulse sequences of the lumbar spine were obtained without and with intravenous contrast. CONTRAST:  8.38mL GADAVIST GADOBUTROL 1 MMOL/ML IV SOLN COMPARISON:  None. FINDINGS: Segmentation:  Normal Alignment:  Normal Vertebrae: No acute compression fracture, discitis-osteomyelitis, facet edema or other focal marrow lesion. No epidural collection. Conus medullaris and cauda equina: Conus extends to the L1 level. Conus and cauda equina appear normal. Paraspinal and other soft tissues: Negative Disc levels: Sagittal imaging extends from T11 to the sacrum. There is no disc herniation. No spinal canal or neural foraminal stenosis. No abnormal contrast enhancement. IMPRESSION: 1. Normal MRI of the lumbar spine. No spinal canal or neural foraminal stenosis. 2. Surgical bed of prior ependymoma resection (T8-T10) is not included in the field of view. Electronically Signed   By: Ulyses Jarred M.D.   On: 09/16/2019 03:06     Avrumi Corell PA-C [facility] [phone]   Assessment: 73 year old male with 1 week of mild confusion followed by 2 days of increasing lethargy in the setting of a very large baclofen dose in the setting of AKI/UTI and decreased creatinine clearance.  Patient's lethargy and confusion likely secondary to baclofen toxicity.  MRI shows no abnormalities.   As patient has remained afebrile at this point no need for LP.  EEG pending however patient has already been placed on Keppra.  At this point I believe majority of his lethargy and confusion is due to baclofen toxicity in the setting of urinary tract infection.    Recommendations: Continue Rocephin for UTI Continue Keppra 500 mg Continue to treat AKI/UTI per IM Continue dose of baclofen of 15 mg 4 times daily Continue tizanidine Continue Klonopin in order to avoid any form of withdrawal Neurology will follow patient and EEG  Dr. Aviva Kluver to make any adjustments in note    09/16/2019, 9:46 AM   NEUROHOSPITALIST ADDENDUM Performed a face to face diagnostic evaluation.   I have reviewed the contents of history and physical exam as documented by PA/ARNP/Resident and agree with above documentation.  I have discussed and formulated the above plan as documented. Edits to the note have been made as needed.  Seizures secondary to baclofen toxicity.  EEG negative for epileptiform discharges, shows generalized encephalopathy.  I do not think patient needs to be continued on Keppra on discharge.    Karena Addison Shayleigh Bouldin MD [facility] [phone]   If 7pm to 7am, please call on call as listed on AM[facility].

## 2019-09-16 NOTE — Progress Notes (Signed)
UA looks grossly infected with > 50 WBC / hpf, large LE, and positive nitrites.  Starting rocephin.  UCx ordered (apparently was sent with UA according to RN note).

## 2019-09-16 NOTE — Progress Notes (Signed)
EEG completed, results pending. 

## 2019-09-16 NOTE — Progress Notes (Signed)
PROGRESS NOTE    David Kim  [id number] DOB: [birth year] DOA: 09/15/2019 PCP: Biagio Borg, MD   Brief Narrative: 73 year old male with history of paraplegia due to spinal tumor T10, with surgery 9 years ago, OSA on CPAP, neurogenic bladder and bowel apparently able to empty bladder without catheter use and wearing diapers and has "occasional UTI".  Patient presented with altered mental status, slow to respond, with trouble with his memory over the past week and had a seizure at home described by wife as generalized convulsive activity. Seen in In the ER, neuro was consulted work-up revealed UTI, underwent CT scan and MRI brain started on Keppra 500 mg twice daily.  Subjective:  Seen this am, getting EEG Appears alert awake but confused when asked what his name replies "David Kim", " it is a security question" Withdraws bilateral leg to pain.  Assessment & Plan:   Acute metabolic encephalopathy suspecting multifactorial with baclofen toxicity, UTI and suspected New onset seizure: Neurology following and Started on Keppra and UTI treatment.  Had MRI brain and CT scan-which are essentially normal, MRI lumbar spine normal.  Pending EEG.patient is alert awake however confused.  Continue on supportive care fall precaution, continue further plan as per neurology.  Speech eval for swallow eval. continue low-dose baclofen-hold for sedation,held this morning  New onset seizure: On Keppra 500 twice daily.  Neuro on board. MRI is  Negative.  Urinary tract infection in the setting of paraplegia neurogenic bladder.  Continue ceftriaxone and follow-up urine culture.  wbc at 13 K.  Repeat CBC in a.m.  AKI: Resolved overnight with IV fluids.  Hypokalemia resolved  Paraplegia spinal tumor T10, with surgery 9 years ago, with neurogenic bladder and bowel: On baclofen/tizanidine and dose decreased due to #1.  MRI lumbar spine normal.  OSA on CPAP  There is no height or weight on file to  calculate BMI.    DVT prophylaxis:lovenox Code Status: full Family Communication: No family at bedside.   Disposition Plan: Patient continues to be encephalopathic, will need at least 2 midnight stay for work-up of his ongoing encephalopathy and UTI treatment. Change to inpatient status.   Consultants:  neuro  Procedures:eeg-pending  Microbiology:Urine culture- pending  Antimicrobials: Anti-infectives (From admission, onward)   Start     Dose/Rate Route Frequency Ordered Stop   09/16/19 0330  cefTRIAXone (ROCEPHIN) 1 g in sodium chloride 0.9 % 100 mL IVPB     1 g 200 mL/hr over 30 Minutes Intravenous Every 24 hours 09/16/19 0326         Objective: Vitals:   09/16/19 0346 09/16/19 0400 09/16/19 0500 09/16/19 0600  BP: 95/65     Pulse: 84 83 79 80  Resp: 17 17 17 18   Temp: 98.4 F (36.9 C)     TempSrc: Axillary     SpO2: 98% 98% 99% 100%    Intake/Output Summary (Last 24 hours) at 09/16/2019 0742 Last data filed at 09/16/2019 [id number] Gross per 24 hour  Intake 1362.92 ml  Output 1000 ml  Net 362.92 ml   There were no vitals filed for this visit. Weight change:   There is no height or weight on file to calculate BMI.  Intake/Output from previous day: 11/09 0701 - 11/10 0700 In: 1362.9 [I.V.:451.3; IV Piggyback:911.7] Out: -  Intake/Output this shift: Total I/O In: -  Out: 1000 [Urine:1000]  Examination:  General exam: AA ORIENTED X0 , follows some commands, confused HEENT:Oral mucosa moist, Ear/Nose WNL grossly, dentition normal. Respiratory  system: Diminished at the base,no wheezing or crackles,no use of accessory muscle Cardiovascular system: S1 & S2 +, No JVD,. Gastrointestinal system: Abdomen soft, NT,ND, BS+ Nervous System:Alert, awake, follows some commands, confused, extremities: No edema, distal peripheral pulses palpable.  Skin: No rashes,no icterus. MSK: Normal muscle bulk,tone, power  Medications:  Scheduled Meds: . aspirin EC  81 mg Oral  Daily  . baclofen  15 mg Oral TID AC & HS  . enoxaparin (LOVENOX) injection  40 mg Subcutaneous Daily  . LORazepam  1 mg Intravenous Q6H  . oxybutynin  5 mg Oral QID  . tiZANidine  4 mg Oral Q6H  . vitamin C  500 mg Oral Daily  . vitamin E  800 Units Oral Daily   Continuous Infusions: . cefTRIAXone (ROCEPHIN)  IV 1 g (09/16/19 0359)  . lactated ringers 75 mL/hr at 09/16/19 0031  . levETIRAcetam      Data Reviewed: I have personally reviewed following labs and imaging studies  CBC: Recent Labs  Lab 09/15/19 2121  WBC 13.5*  NEUTROABS 11.5*  HGB 14.2  HCT 43.9  MCV 90.3  PLT 0000000   Basic Metabolic Panel: Recent Labs  Lab 09/15/19 2121 09/16/19 0356  NA 138 137  K 3.3* 3.9  CL 104 107  CO2 23 23  GLUCOSE 158* 118*  BUN 26* 23  CREATININE 1.43* 1.05  CALCIUM 8.7* 8.2*   GFR: Estimated Creatinine Clearance: 68.9 mL/min (by C-G formula based on SCr of 1.05 mg/dL). Liver Function Tests: Recent Labs  Lab 09/15/19 2121  AST 59*  ALT 24  ALKPHOS 70  BILITOT 1.1  PROT 6.1*  ALBUMIN 3.1*   No results for input(s): LIPASE, AMYLASE in the last 168 hours. No results for input(s): AMMONIA in the last 168 hours. Coagulation Profile: No results for input(s): INR, PROTIME in the last 168 hours. Cardiac Enzymes: No results for input(s): CKTOTAL, CKMB, CKMBINDEX, TROPONINI in the last 168 hours. BNP (last 3 results) No results for input(s): PROBNP in the last 8760 hours. HbA1C: No results for input(s): HGBA1C in the last 72 hours. CBG: No results for input(s): GLUCAP in the last 168 hours. Lipid Profile: No results for input(s): CHOL, HDL, LDLCALC, TRIG, CHOLHDL, LDLDIRECT in the last 72 hours. Thyroid Function Tests: No results for input(s): TSH, T4TOTAL, FREET4, T3FREE, THYROIDAB in the last 72 hours. Anemia Panel: No results for input(s): VITAMINB12, FOLATE, FERRITIN, TIBC, IRON, RETICCTPCT in the last 72 hours. Sepsis Labs: Recent Labs  Lab 09/15/19 2121  09/16/19 0356  LATICACIDVEN 1.4 0.9    Recent Results (from the past 240 hour(s))  SARS CORONAVIRUS 2 (TAT 6-24 HRS) Nasopharyngeal Nasopharyngeal Swab     Status: None   Collection Time: 09/15/19 10:54 PM   Specimen: Nasopharyngeal Swab  Result Value Ref Range Status   SARS Coronavirus 2 NEGATIVE NEGATIVE Final    Comment: (NOTE) SARS-CoV-2 target nucleic acids are NOT DETECTED. The SARS-CoV-2 RNA is generally detectable in upper and lower respiratory specimens during the acute phase of infection. Negative results do not preclude SARS-CoV-2 infection, do not rule out co-infections with other pathogens, and should not be used as the sole basis for treatment or other patient management decisions. Negative results must be combined with clinical observations, patient history, and epidemiological information. The expected result is Negative. Fact Sheet for Patients: SugarRoll.be Fact Sheet for Healthcare Providers: https://www.woods-mathews.com/ This test is not yet approved or cleared by the [place] FDA and  has been authorized for detection and/or diagnosis  of SARS-CoV-2 by FDA under an Emergency Use Authorization (EUA). This EUA will remain  in effect (meaning this test can be used) for the duration of the COVID-19 declaration under Section 56 4(b)(1) of the Act, 21 U.S.C. section 360bbb-3(b)(1), unless the authorization is terminated or revoked sooner. Performed at [facility], [place] [address], [place],  [address]       Radiology Studies: Ct Head Wo Contrast  Result Date: 09/15/2019 CLINICAL DATA:  New onset seizure EXAM: CT HEAD WITHOUT CONTRAST TECHNIQUE: Contiguous axial images were obtained from the base of the skull through the vertex without intravenous contrast. COMPARISON:  None. FINDINGS: The examination is severely motion degraded. There is no midline shift. There is no visible, large extra-axial  collection. No depressed skull fracture. IMPRESSION: Severely motion degraded examination.  No visible acute abnormality Electronically Signed   By: Ulyses Jarred M.D.   On: 09/15/2019 22:15   Mr Brain Wo Contrast  Result Date: 09/16/2019 CLINICAL DATA:  Confusion and seizure activity. EXAM: MRI HEAD WITHOUT CONTRAST TECHNIQUE: Multiplanar, multiecho pulse sequences of the brain and surrounding structures were obtained without intravenous contrast. COMPARISON:  None. FINDINGS: BRAIN: There is no acute infarct, acute hemorrhage or extra-axial collection. The white matter signal is normal for the patient's age. The cerebral and cerebellar volume are age-appropriate. There is no hydrocephalus. The midline structures are normal. VASCULAR: The major intracranial arterial and venous sinus flow voids are normal. Susceptibility-sensitive sequences show no chronic microhemorrhage or superficial siderosis. SKULL AND UPPER CERVICAL SPINE: Calvarial bone marrow signal is normal. There is no skull base mass. The visualized upper cervical spine and soft tissues are normal. SINUSES/ORBITS: There are no fluid levels or advanced mucosal thickening. The mastoid air cells and middle ear cavities are free of fluid. The orbits are normal. IMPRESSION: Normal aging brain. Electronically Signed   By: Ulyses Jarred M.D.   On: 09/16/2019 02:29   Mr Lumbar Spine W Wo Contrast  Result Date: 09/16/2019 CLINICAL DATA:  Confusion and seizure activity. History of lower thoracic ependymoma. EXAM: MRI LUMBAR SPINE WITHOUT AND WITH CONTRAST TECHNIQUE: Multiplanar and multiecho pulse sequences of the lumbar spine were obtained without and with intravenous contrast. CONTRAST:  8.72mL GADAVIST GADOBUTROL 1 MMOL/ML IV SOLN COMPARISON:  None. FINDINGS: Segmentation:  Normal Alignment:  Normal Vertebrae: No acute compression fracture, discitis-osteomyelitis, facet edema or other focal marrow lesion. No epidural collection. Conus medullaris and  cauda equina: Conus extends to the L1 level. Conus and cauda equina appear normal. Paraspinal and other soft tissues: Negative Disc levels: Sagittal imaging extends from T11 to the sacrum. There is no disc herniation. No spinal canal or neural foraminal stenosis. No abnormal contrast enhancement. IMPRESSION: 1. Normal MRI of the lumbar spine. No spinal canal or neural foraminal stenosis. 2. Surgical bed of prior ependymoma resection (T8-T10) is not included in the field of view. Electronically Signed   By: Ulyses Jarred M.D.   On: 09/16/2019 03:06      LOS: 0 days   Time spent: More than 50% of that time was spent in counseling and/or coordination of care.  Antonieta Pert, MD [facility]  09/16/2019, 7:42 AM

## 2019-09-16 NOTE — ED Notes (Signed)
Patient transported to MRI 

## 2019-09-16 NOTE — ED Notes (Signed)
Urine culture sent with urine sample. 

## 2019-09-16 NOTE — Progress Notes (Signed)
Patient not placed on CPAP at this time due to no negative COVID test.

## 2019-09-17 DIAGNOSIS — T428X1A Poisoning by antiparkinsonism drugs and other central muscle-tone depressants, accidental (unintentional), initial encounter: Secondary | ICD-10-CM

## 2019-09-17 DIAGNOSIS — G8220 Paraplegia, unspecified: Secondary | ICD-10-CM

## 2019-09-17 LAB — BASIC METABOLIC PANEL
Anion gap: 11 (ref 5–15)
BUN: 15 mg/dL (ref 8–23)
CO2: 22 mmol/L (ref 22–32)
Calcium: 8.2 mg/dL — ABNORMAL LOW (ref 8.9–10.3)
Chloride: 104 mmol/L (ref 98–111)
Creatinine, Ser: 0.91 mg/dL (ref 0.61–1.24)
GFR calc Af Amer: >60 mL/min (ref >60)
GFR calc non Af Amer: >60 mL/min (ref >60)
Glucose, Bld: 92 mg/dL (ref 70–99)
Potassium: 3.8 mmol/L (ref 3.5–5.1)
Sodium: 137 mmol/L (ref 135–145)

## 2019-09-17 LAB — CBC
HCT: 40 % (ref 39.0–52.0)
Hemoglobin: 13.1 g/dL (ref 13.0–17.0)
MCH: 28.9 pg (ref 26.0–34.0)
MCHC: 32.8 g/dL (ref 30.0–36.0)
MCV: 88.3 fL (ref 80.0–100.0)
Platelets: 147 10*3/uL — ABNORMAL LOW (ref 150–400)
RBC: 4.53 MIL/uL (ref 4.22–5.81)
RDW: 14 % (ref 11.5–15.5)
WBC: 8 10*3/uL (ref 4.0–10.5)
nRBC: 0 % (ref 0.0–0.2)

## 2019-09-17 MED ORDER — BACLOFEN 10 MG PO TABS
20.0000 mg | ORAL_TABLET | Freq: Three times a day (TID) | ORAL | Status: DC
  Administered 2019-09-17 – 2019-09-22 (×20): 20 mg via ORAL
  Filled 2019-09-17 (×21): qty 2

## 2019-09-17 MED ORDER — HALOPERIDOL LACTATE 5 MG/ML IJ SOLN
5.0000 mg | Freq: Four times a day (QID) | INTRAMUSCULAR | Status: AC | PRN
  Administered 2019-09-17: 12:00:00 5 mg via INTRAMUSCULAR
  Filled 2019-09-17: qty 1

## 2019-09-17 NOTE — Progress Notes (Addendum)
NEUROLOGY PROGRESS NOTE  Subjective: Patient extremely agitated at this point time, trying to pull out all IVs, trying to get out of bed, paranoid about cleanliness and hallucinating about dirt on him.  Exam: Vitals:   09/17/19 0306 09/17/19 0809  BP:  126/71  Pulse: 99 92  Resp: 18 20  Temp:  99 F (37.2 C)  SpO2: 92% 92%    ROS Unable to obtain secondary to agitation and confusion        Physical Exam  Constitutional: Appears well-developed and well-nourished.  Psych: Agitated and confused Eyes: No scleral injection HENT: No OP obstrucion Head: Normocephalic.  Cardiovascular: Normal rhythm but tachycardic Respiratory: Effort normal, non-labored breathing GI: Soft.  No distension  Skin: WDI   Neuro:  Mental Status: Alert, oriented, thought content appropriate.  Speech fluent without evidence of aphasia.  Able to follow 3 step commands without difficulty. Cranial Nerves: II:  Visual fields grossly normal,  III,IV, VI: Follows practitioner in the room V,VII: Face symmetrical VIII: hearing normal bilaterally Motor: Right : Upper extremity   5/5    Left:     Upper extremity   5/5  Lower extremity   5/5     Lower extremity   5/5  Sensory: Examine secondary agitation Deep Tendon Reflexes: Examine secondary agitation Plantars: Examine secondary agitation     Medications:  Scheduled: . aspirin EC  81 mg Oral Daily  . baclofen  15 mg Oral TID AC & HS  . Chlorhexidine Gluconate Cloth  6 each Topical Daily  . enoxaparin (LOVENOX) injection  40 mg Subcutaneous Daily  . influenza vaccine adjuvanted  0.5 mL Intramuscular Tomorrow-1000  . LORazepam  1 mg Intravenous Q6H  . oxybutynin  5 mg Oral QID  . tiZANidine  4 mg Oral Q6H  . vitamin C  500 mg Oral Daily  . vitamin E  800 Units Oral Daily   Continuous: . cefTRIAXone (ROCEPHIN)  IV Stopped (09/17/19 0309)  . lactated ringers 125 mL/hr at 09/17/19 0600  . levETIRAcetam 500 mg (09/17/19 [id number])    Pertinent  Labs/Diagnostics: Urine culture-gram-negative rods greater than 100,000  Ct Head Wo Contrast  Result Date: 09/15/2019  IMPRESSION: Severely motion degraded examination.  No visible acute abnormality Electronically Signed   By: Ulyses Jarred M.D.   On: 09/15/2019 22:15   Mr Brain Wo Contrast  Result Date: 09/16/2019 CLINICAL DATA:  Confusion and seizure activity. EXAM: MRI HEAD WITHOUT CONTRAST . IMPRESSION: Normal aging brain. Electronically Signed   By: Ulyses Jarred M.D.   On: 09/16/2019 02:29   Mr Lumbar Spine W Wo Contrast  Result Date: 09/16/2019 . IMPRESSION: 1. Normal MRI of the lumbar spine. No spinal canal or neural foraminal stenosis. 2. Surgical bed of prior ependymoma resection (T8-T10) is not included in the field of view. Electronically Signed   By: Ulyses Jarred M.D.   On: 09/16/2019 03:06     Sebastyan Pinkhasov PA-C [facility] [phone]   Assessment: 73 year old male with 1 week history of mild confusion followed by 2 days of increased lethargy in the setting of very large baclofen dose in the setting of AKI/UTI and decreased creatinine clearance.  Patient's baclofen was decreased to renal dosing to 15 mg 4 times daily.  This morning found extremely agitated and paranoid.  Most likely baclofen withdrawal causing the sudden onset of agitation and paranoia.  At this point time we will increase patient's baclofen to 20 mg 4 times daily and see if this improves his  agitation and paranoia. Impression:  -Baclofen toxicity -Now baclofen withdrawal possibly -Agitation and paranoia -UTI in the setting of AKI  Recommendations: -Continue Rocephin for UTI -Continue Keppra 500 mg twice daily -Continue to treat AKI/UTI per IM -We will increase baclofen to 20 mg 4 times daily for possibility of baclofen withdrawal -Continue benzos (Ativan or Klonopin) -Neurology will continue to follow patient to     09/17/2019, 11:21 AM  NEUROHOSPITALIST ADDENDUM Performed a  face to face diagnostic evaluation.   I have reviewed the contents of history and physical exam as documented by PA/ARNP/Resident and agree with above documentation.  I have discussed and formulated the above plan as documented. Edits to the note have been made as needed.  Patient hallucinating and significantly altered, does not appear to be significantly tachycardic to less likely benzo withdrawal.  Agree with increasing baclofen.    Karena Addison Rafia Shedden MD [facility] [phone]   If 7pm to 7am, please call on call as listed on AMION.

## 2019-09-17 NOTE — Progress Notes (Signed)
Patient becoming more agitated as the night progresses's.  Accused RN of stealing his IV pump\pole that he bought on [employer] while RT was reapplying his CPAP, RT tried to redirect pt and pt threatened to harm [place] and started yelling and shaking his fists at [place].  Multiple staff members tried to reorient patient then left the room due to patients behavior escalating; staff left room to allow patient to calm down.  RN rounding on patient and patient wanting his wheelchair from the other room; attempted to reorient patient but patient continuing to escalate; RN left room and monitored pt from the doorway.  RN returned from lunch and patient threatening RN that she is going to jail for stealing his car and also for ordering something on the room computer and charging it to his credit card.  Attempted to redirect patient again but patient started yelling at staff, so staff left the room to decrease agitation of patient.  Will continue to monitor patient.

## 2019-09-17 NOTE — Progress Notes (Signed)
PROGRESS NOTE    David Kim  [id number] DOB: 13-Jun-1946 DOA: 09/15/2019 PCP: Biagio Borg, MD   Brief Narrative: David Kim is a 73 y.o. male with history of paraplegia due to spinal tumor T10, with surgery 9 years ago, OSA on CPAP, neurogenic bladder and bowel. Patient presented secondary to seizures in setting of possible Baclofen toxicity   Assessment & Plan:   Principal Problem:   Acute metabolic encephalopathy Active Problems:   Paraplegia ([place])   Spinal cord injury, thoracic region [facility])   New onset seizure ([place])   Post-ictal state ([place])   Acute encephalopathy   Acute toxic encephalopathy In setting of Baclofen toxicity. Initially improved with adjustment of dose but now appears to be significantly agitated concerning for withdrawal. Renal function significantly improved which may have precipitated withdrawal. -Increase to Baclofen 20 mg QID  Seizures In setting of Baclofen toxicity. Started on Keppra but likely will not require long term treatment per neurology recommendations. EEG significant for generalized slowing  Paraplegia Neurogenic bladder -Continue Baclofen (dose adjusted as mentioned above), tizanidine, oxybutynin   OSA -CPAP  AKI Baseline creatinine of less than 1. Creatinine of 1.43 on admission.   DVT prophylaxis: lovenox Code Status:   Code Status: Full Code Family Communication: Wife at bedside Disposition Plan: Discharge when mental status improved   Consultants:   Neurology  Procedures:   11/10: EEG  IMPRESSION: This study is suggestive of moderate diffuse encephalopathy, nonspecific to etiology. No seizures or epileptiform discharges were seen throughout the recording.  Antimicrobials:  None    Subjective: Agitated. Not coherent.  Objective: Vitals:   09/17/19 0053 09/17/19 0301 09/17/19 0306 09/17/19 0809  BP:    126/71  Pulse: 85 99 99 92  Resp: 20 20 18 20   Temp:    99 F (37.2 C)  TempSrc:    Oral   SpO2: 95% 94% 92% 92%  Weight:      Height:        Intake/Output Summary (Last 24 hours) at 09/17/2019 1130 Last data filed at 09/17/2019 0844 Gross per 24 hour  Intake 2704.16 ml  Output 2450 ml  Net 254.16 ml   Filed Weights   09/16/19 2013  Weight: 88 kg    Examination:  General exam: Appears calm and comfortable Respiratory system: Clear to auscultation. Respiratory effort normal. Cardiovascular system: S1 & S2 heard, RRR. No murmurs, rubs, gallops or clicks. Gastrointestinal system: Abdomen is nondistended, soft and nontender. No organomegaly or masses felt. Normal bowel sounds heard. Central nervous system: Alert and oriented to person. Follows some commands. No focal neurological deficits. Extremities: No edema. No calf tenderness Skin: No cyanosis. No rashes Psychiatry: Judgement and insight appear impaired. Agitated. Paranoia.     Data Reviewed: I have personally reviewed following labs and imaging studies  CBC: Recent Labs  Lab 09/15/19 2121 09/17/19 0258  WBC 13.5* 8.0  NEUTROABS 11.5*  --   HGB 14.2 13.1  HCT 43.9 40.0  MCV 90.3 88.3  PLT 160 Q000111Q*   Basic Metabolic Panel: Recent Labs  Lab 09/15/19 2121 09/16/19 0356 09/17/19 0258  NA 138 137 137  K 3.3* 3.9 3.8  CL 104 107 104  CO2 23 23 22   GLUCOSE 158* 118* 92  BUN 26* 23 15  CREATININE 1.43* 1.05 0.91  CALCIUM 8.7* 8.2* 8.2*   GFR: Estimated Creatinine Clearance: 80.2 mL/min (by C-G formula based on SCr of 0.91 mg/dL). Liver Function Tests: Recent Labs  Lab 09/15/19 2121  AST 59*  ALT 24  ALKPHOS 70  BILITOT 1.1  PROT 6.1*  ALBUMIN 3.1*   No results for input(s): LIPASE, AMYLASE in the last 168 hours. No results for input(s): AMMONIA in the last 168 hours. Coagulation Profile: No results for input(s): INR, PROTIME in the last 168 hours. Cardiac Enzymes: No results for input(s): CKTOTAL, CKMB, CKMBINDEX, TROPONINI in the last 168 hours. BNP (last 3 results) No results for  input(s): PROBNP in the last 8760 hours. HbA1C: No results for input(s): HGBA1C in the last 72 hours. CBG: No results for input(s): GLUCAP in the last 168 hours. Lipid Profile: No results for input(s): CHOL, HDL, LDLCALC, TRIG, CHOLHDL, LDLDIRECT in the last 72 hours. Thyroid Function Tests: No results for input(s): TSH, T4TOTAL, FREET4, T3FREE, THYROIDAB in the last 72 hours. Anemia Panel: No results for input(s): VITAMINB12, FOLATE, FERRITIN, TIBC, IRON, RETICCTPCT in the last 72 hours. Sepsis Labs: Recent Labs  Lab 09/15/19 2121 09/16/19 0356  LATICACIDVEN 1.4 0.9    Recent Results (from the past 240 hour(s))  SARS CORONAVIRUS 2 (TAT 6-24 HRS) Nasopharyngeal Nasopharyngeal Swab     Status: None   Collection Time: 09/15/19 10:54 PM   Specimen: Nasopharyngeal Swab  Result Value Ref Range Status   SARS Coronavirus 2 NEGATIVE NEGATIVE Final    Comment: (NOTE) SARS-CoV-2 target nucleic acids are NOT DETECTED. The SARS-CoV-2 RNA is generally detectable in upper and lower respiratory specimens during the acute phase of infection. Negative results do not preclude SARS-CoV-2 infection, do not rule out co-infections with other pathogens, and should not be used as the sole basis for treatment or other patient management decisions. Negative results must be combined with clinical observations, patient history, and epidemiological information. The expected result is Negative. Fact Sheet for Patients: SugarRoll.be Fact Sheet for Healthcare Providers: https://www.woods-mathews.com/ This test is not yet approved or cleared by the [place] FDA and  has been authorized for detection and/or diagnosis of SARS-CoV-2 by FDA under an Emergency Use Authorization (EUA). This EUA will remain  in effect (meaning this test can be used) for the duration of the COVID-19 declaration under Section 56 4(b)(1) of the Act, 21 U.S.C. section 360bbb-3(b)(1),  unless the authorization is terminated or revoked sooner. Performed at [facility], [name] [address] [place], [place] [address]   Culture, Urine     Status: Abnormal (Preliminary result)   Collection Time: 09/16/19  4:03 AM   Specimen: Urine, Random  Result Value Ref Range Status   Specimen Description URINE, RANDOM  Final   Special Requests   Final    NONE Performed at [facility], [place] [address] [place], [place] [address]    Culture >=100,000 COLONIES/mL GRAM NEGATIVE RODS (A)  Final   Report Status PENDING  Incomplete         Radiology Studies: Ct Head Wo Contrast  Result Date: 09/15/2019 CLINICAL DATA:  New onset seizure EXAM: CT HEAD WITHOUT CONTRAST TECHNIQUE: Contiguous axial images were obtained from the base of the skull through the vertex without intravenous contrast. COMPARISON:  None. FINDINGS: The examination is severely motion degraded. There is no midline shift. There is no visible, large extra-axial collection. No depressed skull fracture. IMPRESSION: Severely motion degraded examination.  No visible acute abnormality Electronically Signed   By: Ulyses Jarred M.D.   On: 09/15/2019 22:15   Mr Brain Wo Contrast  Result Date: 09/16/2019 CLINICAL DATA:  Confusion and seizure activity. EXAM: MRI HEAD WITHOUT CONTRAST TECHNIQUE: Multiplanar,  multiecho pulse sequences of the brain and surrounding structures were obtained without intravenous contrast. COMPARISON:  None. FINDINGS: BRAIN: There is no acute infarct, acute hemorrhage or extra-axial collection. The white matter signal is normal for the patient's age. The cerebral and cerebellar volume are age-appropriate. There is no hydrocephalus. The midline structures are normal. VASCULAR: The major intracranial arterial and venous sinus flow voids are normal. Susceptibility-sensitive sequences show no chronic microhemorrhage or superficial siderosis. SKULL AND UPPER CERVICAL SPINE: Calvarial bone marrow signal is  normal. There is no skull base mass. The visualized upper cervical spine and soft tissues are normal. SINUSES/ORBITS: There are no fluid levels or advanced mucosal thickening. The mastoid air cells and middle ear cavities are free of fluid. The orbits are normal. IMPRESSION: Normal aging brain. Electronically Signed   By: Ulyses Jarred M.D.   On: 09/16/2019 02:29   Mr Lumbar Spine W Wo Contrast  Result Date: 09/16/2019 CLINICAL DATA:  Confusion and seizure activity. History of lower thoracic ependymoma. EXAM: MRI LUMBAR SPINE WITHOUT AND WITH CONTRAST TECHNIQUE: Multiplanar and multiecho pulse sequences of the lumbar spine were obtained without and with intravenous contrast. CONTRAST:  8.63mL GADAVIST GADOBUTROL 1 MMOL/ML IV SOLN COMPARISON:  None. FINDINGS: Segmentation:  Normal Alignment:  Normal Vertebrae: No acute compression fracture, discitis-osteomyelitis, facet edema or other focal marrow lesion. No epidural collection. Conus medullaris and cauda equina: Conus extends to the L1 level. Conus and cauda equina appear normal. Paraspinal and other soft tissues: Negative Disc levels: Sagittal imaging extends from T11 to the sacrum. There is no disc herniation. No spinal canal or neural foraminal stenosis. No abnormal contrast enhancement. IMPRESSION: 1. Normal MRI of the lumbar spine. No spinal canal or neural foraminal stenosis. 2. Surgical bed of prior ependymoma resection (T8-T10) is not included in the field of view. Electronically Signed   By: Ulyses Jarred M.D.   On: 09/16/2019 03:06        Scheduled Meds: . aspirin EC  81 mg Oral Daily  . baclofen  15 mg Oral TID AC & HS  . Chlorhexidine Gluconate Cloth  6 each Topical Daily  . enoxaparin (LOVENOX) injection  40 mg Subcutaneous Daily  . influenza vaccine adjuvanted  0.5 mL Intramuscular Tomorrow-1000  . LORazepam  1 mg Intravenous Q6H  . oxybutynin  5 mg Oral QID  . tiZANidine  4 mg Oral Q6H  . vitamin C  500 mg Oral Daily  . vitamin E   800 Units Oral Daily   Continuous Infusions: . cefTRIAXone (ROCEPHIN)  IV Stopped (09/17/19 0309)  . lactated ringers 125 mL/hr at 09/17/19 0600  . levETIRAcetam 500 mg (09/17/19 0917)     LOS: 1 day     Cordelia Poche, MD [facility] 09/17/2019, 11:30 AM  If 7PM-7AM, please contact night-coverage [email or web address]

## 2019-09-18 ENCOUNTER — Telehealth: Payer: Self-pay

## 2019-09-18 LAB — URINE CULTURE: Culture: >=100000 — AB

## 2019-09-18 NOTE — Progress Notes (Signed)
PROGRESS NOTE    BHAVIN NIELSEN  [id number] DOB: November 24, 1945 DOA: 09/15/2019 PCP: Biagio Borg, MD   Brief Narrative: David Kim is a 73 y.o. male with history of paraplegia due to spinal tumor T10, with surgery 9 years ago, OSA on CPAP, neurogenic bladder and bowel. Patient presented secondary to seizures in setting of possible Baclofen toxicity   Assessment & Plan:   Principal Problem:   Acute metabolic encephalopathy Active Problems:   Paraplegia ([place])   Spinal cord injury, thoracic region [facility])   New onset seizure ([place])   Post-ictal state ([place])   Acute encephalopathy   Acute toxic encephalopathy In setting of Baclofen toxicity. Initially improved with adjustment of dose but now appears to be significantly agitated concerning for withdrawal. Renal function significantly improved which may have precipitated withdrawal. Now appears to be improving again with continued adjustment of Baclofen dose -Continue Baclofen 20 mg QID  Seizures In setting of Baclofen toxicity. Started on Keppra but likely will not require long term treatment per neurology recommendations. EEG significant for generalized slowing.   Paraplegia Neurogenic bladder -Continue Baclofen (dose adjusted as mentioned above), tizanidine, oxybutynin   Anxiety Will discuss possible transition back to Klonopin from Ativan with neurology  OSA -CPAP  UTI klebsiella pneumoniae. -Continue Ceftriaxone while inpatient and while mental status waxes/wanes  AKI Baseline creatinine of less than 1. Creatinine of 1.43 on admission.   DVT prophylaxis: lovenox Code Status:   Code Status: Full Code Family Communication: None at bedside Disposition Plan: Discharge when mental status improved and pending PT recommendations   Consultants:   Neurology  Procedures:   11/10: EEG  IMPRESSION: This study is suggestive of moderate diffuse encephalopathy, nonspecific to etiology. No seizures or epileptiform  discharges were seen throughout the recording.  Antimicrobials:  None    Subjective: Calm this morning. States he was biting his lips which is why they are injured  Objective: Vitals:   09/17/19 2058 09/18/19 0006 09/18/19 0333 09/18/19 0856  BP: 121/68 137/69 130/71 139/77  Pulse: 78 83 92 86  Resp: 20 20 18 15   Temp: 98 F (36.7 C) 98.2 F (36.8 C) 99.5 F (37.5 C) 98.4 F (36.9 C)  TempSrc:  Axillary Oral Oral  SpO2: 93% 95% 97% 94%  Weight:      Height:        Intake/Output Summary (Last 24 hours) at 09/18/2019 1039 Last data filed at 09/18/2019 0332 Gross per 24 hour  Intake 300 ml  Output 1500 ml  Net -1200 ml   Filed Weights   09/16/19 2013  Weight: 88 kg    Examination:  General exam: Appears calm and comfortable  Respiratory system: Clear to auscultation. Respiratory effort normal. Cardiovascular system: S1 & S2 heard, RRR. No murmurs, rubs, gallops or clicks. Gastrointestinal system: Abdomen is nondistended, soft and nontender. No organomegaly or masses felt. Normal bowel sounds heard. Central nervous system: Alert and oriented to person, place and time. No focal neurological deficits. Extremities: No edema. No calf tenderness Skin: No cyanosis. No rashes Psychiatry: Judgement and insight appear normal. Slow to respond.     Data Reviewed: I have personally reviewed following labs and imaging studies  CBC: Recent Labs  Lab 09/15/19 2121 09/17/19 0258  WBC 13.5* 8.0  NEUTROABS 11.5*  --   HGB 14.2 13.1  HCT 43.9 40.0  MCV 90.3 88.3  PLT 160 Q000111Q*   Basic Metabolic Panel: Recent Labs  Lab 09/15/19 2121 09/16/19 0356 09/17/19 0258  NA  138 137 137  K 3.3* 3.9 3.8  CL 104 107 104  CO2 23 23 22   GLUCOSE 158* 118* 92  BUN 26* 23 15  CREATININE 1.43* 1.05 0.91  CALCIUM 8.7* 8.2* 8.2*   GFR: Estimated Creatinine Clearance: 80.2 mL/min (by C-G formula based on SCr of 0.91 mg/dL). Liver Function Tests: Recent Labs  Lab 09/15/19 2121   AST 59*  ALT 24  ALKPHOS 70  BILITOT 1.1  PROT 6.1*  ALBUMIN 3.1*   No results for input(s): LIPASE, AMYLASE in the last 168 hours. No results for input(s): AMMONIA in the last 168 hours. Coagulation Profile: No results for input(s): INR, PROTIME in the last 168 hours. Cardiac Enzymes: No results for input(s): CKTOTAL, CKMB, CKMBINDEX, TROPONINI in the last 168 hours. BNP (last 3 results) No results for input(s): PROBNP in the last 8760 hours. HbA1C: No results for input(s): HGBA1C in the last 72 hours. CBG: No results for input(s): GLUCAP in the last 168 hours. Lipid Profile: No results for input(s): CHOL, HDL, LDLCALC, TRIG, CHOLHDL, LDLDIRECT in the last 72 hours. Thyroid Function Tests: No results for input(s): TSH, T4TOTAL, FREET4, T3FREE, THYROIDAB in the last 72 hours. Anemia Panel: No results for input(s): VITAMINB12, FOLATE, FERRITIN, TIBC, IRON, RETICCTPCT in the last 72 hours. Sepsis Labs: Recent Labs  Lab 09/15/19 2121 09/16/19 0356  LATICACIDVEN 1.4 0.9    Recent Results (from the past 240 hour(s))  SARS CORONAVIRUS 2 (TAT 6-24 HRS) Nasopharyngeal Nasopharyngeal Swab     Status: None   Collection Time: 09/15/19 10:54 PM   Specimen: Nasopharyngeal Swab  Result Value Ref Range Status   SARS Coronavirus 2 NEGATIVE NEGATIVE Final    Comment: (NOTE) SARS-CoV-2 target nucleic acids are NOT DETECTED. The SARS-CoV-2 RNA is generally detectable in upper and lower respiratory specimens during the acute phase of infection. Negative results do not preclude SARS-CoV-2 infection, do not rule out co-infections with other pathogens, and should not be used as the sole basis for treatment or other patient management decisions. Negative results must be combined with clinical observations, patient history, and epidemiological information. The expected result is Negative. Fact Sheet for Patients: SugarRoll.be Fact Sheet for Healthcare  Providers: https://www.woods-mathews.com/ This test is not yet approved or cleared by the [place] FDA and  has been authorized for detection and/or diagnosis of SARS-CoV-2 by FDA under an Emergency Use Authorization (EUA). This EUA will remain  in effect (meaning this test can be used) for the duration of the COVID-19 declaration under Section 56 4(b)(1) of the Act, 21 U.S.C. section 360bbb-3(b)(1), unless the authorization is terminated or revoked sooner. Performed at [facility], [place] [address] [place], [place] [address]   Culture, Urine     Status: Abnormal   Collection Time: 09/16/19  4:03 AM   Specimen: Urine, Random  Result Value Ref Range Status   Specimen Description URINE, RANDOM  Final   Special Requests   Final    NONE Performed at [facility], [place] [address], [place], [place] [address]    Culture >=100,000 COLONIES/mL KLEBSIELLA PNEUMONIAE (A)  Final   Report Status 09/18/2019 FINAL  Final   Organism ID, Bacteria KLEBSIELLA PNEUMONIAE (A)  Final      Susceptibility   Klebsiella pneumoniae - MIC*    AMPICILLIN >=32 RESISTANT Resistant     CEFAZOLIN <=4 SENSITIVE Sensitive     CEFTRIAXONE <=1 SENSITIVE Sensitive     CIPROFLOXACIN <=0.25 SENSITIVE Sensitive     GENTAMICIN <=  1 SENSITIVE Sensitive     IMIPENEM <=0.25 SENSITIVE Sensitive     NITROFURANTOIN 128 RESISTANT Resistant     TRIMETH/SULFA <=20 SENSITIVE Sensitive     AMPICILLIN/SULBACTAM 4 SENSITIVE Sensitive     PIP/TAZO <=4 SENSITIVE Sensitive     Extended ESBL NEGATIVE Sensitive     * >=100,000 COLONIES/mL KLEBSIELLA PNEUMONIAE         Radiology Studies: No results found.      Scheduled Meds: . aspirin EC  81 mg Oral Daily  . baclofen  20 mg Oral TID AC & HS  . Chlorhexidine Gluconate Cloth  6 each Topical Daily  . enoxaparin (LOVENOX) injection  40 mg Subcutaneous Daily  . influenza vaccine adjuvanted  0.5 mL Intramuscular Tomorrow-1000  . LORazepam  1 mg  Intravenous Q6H  . oxybutynin  5 mg Oral QID  . tiZANidine  4 mg Oral Q6H  . vitamin C  500 mg Oral Daily  . vitamin E  800 Units Oral Daily   Continuous Infusions: . cefTRIAXone (ROCEPHIN)  IV 1 g (09/18/19 0226)  . levETIRAcetam 500 mg (09/18/19 1024)     LOS: 2 days     Cordelia Poche, MD [facility] 09/18/2019, 10:39 AM  If 7PM-7AM, please contact night-coverage [email or web address]

## 2019-09-18 NOTE — Telephone Encounter (Signed)
Ptns wife wanted you to know he is on [address] - states not in good shape

## 2019-09-18 NOTE — Progress Notes (Signed)
  Speech Language Pathology Treatment: Dysphagia  Patient Details Name: David Kim MRN: [id number] DOB: [birth year] Today's Date: 09/18/2019 Time: [id number] SLP Time Calculation (min) (ACUTE ONLY): 11 min  Assessment / Plan / Recommendation Clinical Impression  Pt is more alert than when last seen by SLP service.  His wife is present.  He continues to be confused, talking about sanitation and materials he sees floating in the air around his bed.  Pt provided with advanced solid consistencies - demonstrated improved ability to self-feed, active mastication of solids, brisk swallow, no s/s of aspiration.  Recommend advancing diet to dysphagia 3 after discussion with his wife; thin liquids.  Pt will need assist with tray set-up. SL P f/u briefly.   HPI HPI: 73 y.o. male with a history of paraplegia due to spinal tumor was admitted with new onset seizure 09/15/19.  Had mild confusion and increasing lethargy several days PTA in setting of large baclofen dose; AKI, UTI; OSA on CPAP.       SLP Plan  Continue with current plan of care       Recommendations  Diet recommendations: Dysphagia 3 (mechanical soft);Thin liquid Liquids provided via: Cup;Straw Medication Administration: Whole meds with liquid Supervision: Patient able to self feed Postural Changes and/or Swallow Maneuvers: Seated upright 90 degrees                Oral Care Recommendations: Oral care BID Follow up Recommendations: None SLP Visit Diagnosis: Dysphagia, unspecified (R13.10) Plan: Continue with current plan of care       GO                [name] 09/18/2019, 3:45 PM  Khamil Lamica L. Tivis Ringer, [place] Office number [phone] Pager [phone]

## 2019-09-18 NOTE — Plan of Care (Signed)
Progressing towards goals

## 2019-09-18 NOTE — Progress Notes (Addendum)
NEUROLOGY PROGRESS NOTE  Subjective: Patient has no recollection of yesterday.  When asked where he is his remark he is "hell".  No specific complaints.  He does not state that his legs are painful and has no complaint of spasticity in his legs at this time  Exam: Vitals:   09/18/19 0006 09/18/19 0333  BP: 137/69 130/71  Pulse: 83 92  Resp: 20 18  Temp: 98.2 F (36.8 C) 99.5 F (37.5 C)  SpO2: 95% 97%    ROS General ROS: negative for - chills, fatigue, fever, night sweats, weight gain or weight loss Psychological ROS: negative for - behavioral disorder, hallucinations, memory difficulties, mood swings or suicidal ideation Ophthalmic ROS: negative for - blurry vision, double vision, eye pain or loss of vision ENT ROS: negative for - epistaxis, nasal discharge, oral lesions, sore throat, tinnitus or vertigo Respiratory ROS: negative for - cough, hemoptysis, shortness of breath or wheezing Cardiovascular ROS: negative for - chest pain, dyspnea on exertion, edema or irregular heartbeat Gastrointestinal ROS: negative for - abdominal pain, diarrhea, hematemesis, nausea/vomiting or stool incontinence Genito-Urinary ROS: negative for - dysuria, hematuria, incontinence or urinary frequency/urgency Musculoskeletal ROS: negative for - joint swelling or muscular weakness Neurological ROS: as noted in HPI Dermatological ROS: Positive for excoriation and red marks on the lower lip and extending down to just above his chin        Physical Exam  Constitutional: Appears well-developed and well-nourished.  Psych: Lethargic Eyes: No scleral injection HENT: No OP obstrucion Head: Normocephalic.  Cardiovascular: Normal rate and regular rhythm.  Respiratory: Effort normal, non-labored breathing GI: Soft.  No distension. There is no tenderness.  Skin: WDI positive red and excoriated region just above his chin and including lower lip   Neuro:  Mental Status: Patient is alert, he is not  oriented but he is able to tell me his name, age, follow commands, name my finger, tell me what color my glove is.  He is no longer agitated during exam today Cranial Nerves: II:  Visual fields grossly normal,  III,IV, VI: ptosis not present, extra-ocular motions intact bilaterally pupils equal, round, reactive to light and accommodation V,VII: smile symmetric, facial light touch sensation normal bilaterally VIII: hearing normal bilaterally XII: midline tongue extension Motor: Right : Upper extremity   5/5    Left:     Upper extremity   5/5  Lower extremity   3/5     Lower extremity   3/5 Bilateral lower extremities spastic paraparesis Sensory: Intact in the upper extremities.  States he can feel his leg however with pen she withdraws very quickly Plantars: Mute bilaterally Cerebellar: normal finger-to-nose,   Medications:  Scheduled: . aspirin EC  81 mg Oral Daily  . baclofen  20 mg Oral TID AC & HS  . Chlorhexidine Gluconate Cloth  6 each Topical Daily  . enoxaparin (LOVENOX) injection  40 mg Subcutaneous Daily  . influenza vaccine adjuvanted  0.5 mL Intramuscular Tomorrow-1000  . LORazepam  1 mg Intravenous Q6H  . oxybutynin  5 mg Oral QID  . tiZANidine  4 mg Oral Q6H  . vitamin C  500 mg Oral Daily  . vitamin E  800 Units Oral Daily   Continuous: . cefTRIAXone (ROCEPHIN)  IV 1 g (09/18/19 0226)  . levETIRAcetam Stopped (09/17/19 2103)    Pertinent Labs/Diagnostics: Urine culture shows greater than 100,000 colonies of Klebsiella pneumonia  Asbery Kroese PA-C [facility] [phone]   Assessment: 73 year old male with 1 week history of  mild confusion followed by 2 days of increased lethargy in the setting of large doses of baclofen.  Patient also suffering from a Klebsiella pneumonia UTI.  Baclofen initially was decreased to renal dosing of 15 mg 4 times daily.  Due to significant agitation following this there was question of possibly baclofen withdrawal thus  baclofen was increased slightly to 20 mg 4 times daily.  At this point patient is alert and agitation has significantly decreased.  Unfortunately wife is not in the room to tell me if the paranoia has also stopped.   Impression:  -Baclofen toxicity - Urinary tract infecton -Agitation and paranoia which appears to have significantly improved  Recommendations: -Continue Rocephin for UTI -Continue to treat AKI/UTI -We will continue baclofen at 20 mg 4 times daily for now and decrease later likely as patient improves -Will need to follow-up with Dr. Krista Blue once discharged     09/18/2019, 8:44 AM   NEUROHOSPITALIST ADDENDUM Performed a face to face diagnostic evaluation.   I have reviewed the contents of history and physical exam as documented by PA/ARNP/Resident and agree with above documentation.  I have discussed and formulated the above plan as documented. Edits to the note have been made as needed.  Overall there is an improvement in patient's cognition although it may be waxing and waning.  To me he was oriented x3 and is aware he was in the hospital for seizure  Seizure likely in the setting of baclofen toxicity, reduce seizure threshold in the setting of urinary tract infection.  Will discontinue Keppra as EEG negative for epileptiform discharges as well as MRI brain did not show any acute findings that would place him an additional risk for seizures.  Additionally Keppra may be worsening his encephalopathy.  Continue current dose of baclofen, may gradually increase kidney function improves.        Karena Addison Cortana Vanderford MD [facility] [phone]   If 7pm to 7am, please call on call as listed on AMION.

## 2019-09-19 MED ORDER — SODIUM CHLORIDE 0.9 % IV SOLN
INTRAVENOUS | Status: DC | PRN
  Administered 2019-09-19: 23:00:00 250 mL via INTRAVENOUS

## 2019-09-19 NOTE — Progress Notes (Signed)
  Speech Language Pathology Treatment: Dysphagia  Patient Details Name: David Kim MRN: [id number] DOB: [birth year] Today's Date: 09/19/2019 Time: 6812-7517 SLP Time Calculation (min) (ACUTE ONLY): 15 min  Assessment / Plan / Recommendation Clinical Impression  Assisted pt with breakfast tray.  Required set-up, repositioning - pt able to feed himself appropriately despite ongoing confusion.  He was attentive to tray/immediate surroundings. There was no impulsivity noted.  Demonstrated adequate mastication, brisk swallow response, no s/s of aspiration.  No further concerns for aspiration.  D/W pt, RN.  Maintain current diet.  SLP service to sign off.   HPI HPI: 73 y.o. male with a history of paraplegia due to spinal tumor was admitted with new onset seizure 09/15/19.  Had mild confusion and increasing lethargy several days PTA in setting of large baclofen dose; AKI, UTI; OSA on CPAP.       SLP Plan  All goals met       Recommendations  Diet recommendations: Dysphagia 3 (mechanical soft);Thin liquid Liquids provided via: Cup;Straw Medication Administration: Whole meds with liquid Supervision: Patient able to self feed Postural Changes and/or Swallow Maneuvers: Seated upright 90 degrees                Oral Care Recommendations: Oral care BID Follow up Recommendations: None Plan: All goals met       GO               Tecia Cinnamon L. Tivis Ringer, [place] CCC/SLP Acute Rehabilitation Services Office number [phone] Pager [phone]  [name] 09/19/2019, 9:21 AM

## 2019-09-19 NOTE — Progress Notes (Addendum)
NEUROLOGY PROGRESS NOTE  Subjective:  Patient in bed, awake, alert, NAD. Wife at bedside. Per wife the paranoia now waxes and wanes, but it is still present just not as much.   Exam: Vitals:   09/19/19 0304 09/19/19 0722  BP: 140/77 (!) 143/77  Pulse: 70 60  Resp: 18 18  Temp: 98.3 F (36.8 C) 98.4 F (36.9 C)  SpO2: 94% 97%   Physical Exam  Constitutional: Appears well-developed and well-nourished.  Psych: appropriate for situation Eyes: No scleral injection HENT: No OP obstrucion Head: Normocephalic.  Cardiovascular: Normal rate and regular rhythm.  Respiratory: Effort normal, non-labored breathing GI: Soft.  No distension. There is no tenderness.  Skin: WDI , red and excoriated region just above his chin and including lower lip   Neuro:  Mental Status: Patient is alert, oriented to name/age/month/year/ wifes name. Able to follow commands.  Cranial Nerves: II:  Visual fields grossly normal,  III,IV, VI: ptosis not present, extra-ocular motions intact bilaterally pupils equal, round, reactive to light and accommodation V,VII: smile symmetric, facial light touch sensation normal bilaterally VIII: hearing normal bilaterally XII: midline tongue extension Motor: Right : Upper extremity   5/5  Left:     Upper extremity   5/5 Bilateral lower extremity weakness with left leg starting to have contractures.   Sensory: Intact to light touch in BUE. States that he can not feel in BLE. Plantars: Mute bilaterally Cerebellar: normal finger-to-nose,   Medications:  Scheduled: . aspirin EC  81 mg Oral Daily  . baclofen  20 mg Oral TID AC & HS  . Chlorhexidine Gluconate Cloth  6 each Topical Daily  . enoxaparin (LOVENOX) injection  40 mg Subcutaneous Daily  . influenza vaccine adjuvanted  0.5 mL Intramuscular Tomorrow-1000  . LORazepam  1 mg Intravenous Q6H  . oxybutynin  5 mg Oral QID  . tiZANidine  4 mg Oral Q6H  . vitamin C  500 mg Oral Daily  . vitamin E  800 Units Oral  Daily   Continuous: . cefTRIAXone (ROCEPHIN)  IV Stopped (09/19/19 0352)  . levETIRAcetam 500 mg (09/19/19 0919)    Pertinent Labs/Diagnostics: Urine culture shows greater than 100,000 colonies of Klebsiella pneumonia  Laurey Morale, MSN, NP-C [facility] [phone]   Attending Neurologist note to follow with final recommendations:    Assessment: 73 year old male with 1 week history of mild confusion followed by 2 days of increased lethargy in the setting of large doses of baclofen.  Patient also suffering from a Klebsiella pneumonia UTI.  Baclofen initially was decreased to renal dosing of 15 mg 4 times daily.  Due to significant agitation following this there was question of possibly baclofen withdrawal thus baclofen was increased slightly to 20 mg 4 times daily.  At this point patient is alert and agitation appears to be gone. Per wife paranoid waxes and wanes.  Impression:  -Baclofen toxicity - Urinary tract infecton -Agitation and paranoia which appears to have significantly improved  Recommendations: -Continue Rocephin for UTI -Continue to treat AKI/UTI -We will continue baclofen at 20 mg 4 times daily for now and decrease later likely as patient improves -Will need to follow-up with Dr. Krista Blue once discharged   NEUROHOSPITALIST ADDENDUM Performed a face to face diagnostic evaluation.   I have reviewed the contents of history and physical exam as documented by PA/ARNP/Resident and agree with above documentation.  I have discussed and formulated the above plan as documented. Edits to the note have been made as needed.  Patient continuing to improve.  Patient to follow-up Dr. Krista Blue on discharge.   Karena Addison Bee Hammerschmidt MD [facility] [phone]   If 7pm to 7am, please call on call as listed on AMION.

## 2019-09-19 NOTE — Plan of Care (Signed)
Patient progressing toward plan of care goals. 

## 2019-09-19 NOTE — Progress Notes (Signed)
PROGRESS NOTE    David Kim  [id number] DOB: 1946-08-02 DOA: 09/15/2019 PCP: Biagio Borg, MD   Brief Narrative: David Kim is a 73 y.o. male with history of paraplegia due to spinal tumor T10, with surgery 9 years ago, OSA on CPAP, neurogenic bladder and bowel. Patient presented secondary to seizures in setting of possible Baclofen toxicity   Assessment & Plan:   Principal Problem:   Acute metabolic encephalopathy Active Problems:   Paraplegia ([place])   Spinal cord injury, thoracic region [facility])   New onset seizure ([place])   Post-ictal state ([place])   Acute encephalopathy   Acute toxic encephalopathy In setting of Baclofen toxicity. Initially improved with adjustment of dose but now appears to be significantly agitated concerning for withdrawal. Renal function significantly improved which may have precipitated withdrawal. Now appears to be improving again with continued adjustment of Baclofen dose -Continue Baclofen 20 mg QID, titrate up back to home dose slowly  Seizures In setting of Baclofen toxicity. Started on Keppra but likely will not require long term treatment per neurology recommendations. EEG significant for generalized slowing.   Paraplegia Neurogenic bladder -Continue Baclofen (dose adjusted as mentioned above), tizanidine, oxybutynin  -PT/OT evaluations  Anxiety Will discuss possible transition back to Klonopin from Ativan with neurology  OSA -CPAP  UTI klebsiella pneumoniae. -Continue Ceftriaxone while inpatient and while mental status waxes/wanes  AKI Baseline creatinine of less than 1. Creatinine of 1.43 on admission. Resolved.   DVT prophylaxis: lovenox Code Status:   Code Status: Full Code Family Communication: None at bedside Disposition Plan: Discharge when mental status improved and pending PT recommendations   Consultants:   Neurology  Procedures:   11/10: EEG  IMPRESSION: This study is suggestive of moderate diffuse  encephalopathy, nonspecific to etiology. No seizures or epileptiform discharges were seen throughout the recording.  Antimicrobials:  None    Subjective: No issues overnight other than some odd behavior but much improved per wife.  Objective: Vitals:   09/18/19 2203 09/18/19 2304 09/19/19 0304 09/19/19 0722  BP:  135/72 140/77 (!) 143/77  Pulse: 79 78 70 60  Resp: 18 18 18 18   Temp:  99.1 F (37.3 C) 98.3 F (36.8 C) 98.4 F (36.9 C)  TempSrc:  Oral Oral Oral  SpO2: 94% 96% 94% 97%  Weight:      Height:        Intake/Output Summary (Last 24 hours) at 09/19/2019 1055 Last data filed at 09/19/2019 0919 Gross per 24 hour  Intake 760 ml  Output 3075 ml  Net -2315 ml   Filed Weights   09/16/19 2013  Weight: 88 kg    Examination:  General exam: Appears calm and comfortable Respiratory system: Clear to auscultation. Respiratory effort normal. Cardiovascular system: S1 & S2 heard, RRR. No murmurs, rubs, gallops or clicks. Gastrointestinal system: Abdomen is nondistended, soft and nontender. No organomegaly or masses felt. Normal bowel sounds heard. Central nervous system: Alert and oriented. Extremities: No edema. No calf tenderness Skin: No cyanosis. No rashes Psychiatry: Judgement and insight appear normal. Mood & affect appropriate.     Data Reviewed: I have personally reviewed following labs and imaging studies  CBC: Recent Labs  Lab 09/15/19 2121 09/17/19 0258  WBC 13.5* 8.0  NEUTROABS 11.5*  --   HGB 14.2 13.1  HCT 43.9 40.0  MCV 90.3 88.3  PLT 160 [id number]*   Basic Metabolic Panel: Recent Labs  Lab 09/15/19 2121 09/16/19 0356 09/17/19 0258  NA 138 137 137  K 3.3* 3.9 3.8  CL 104 107 104  CO2 23 23 22   GLUCOSE 158* 118* 92  BUN 26* 23 15  CREATININE 1.43* 1.05 0.91  CALCIUM 8.7* 8.2* 8.2*   GFR: Estimated Creatinine Clearance: 80.2 mL/min (by C-G formula based on SCr of 0.91 mg/dL). Liver Function Tests: Recent Labs  Lab 09/15/19 2121   AST 59*  ALT 24  ALKPHOS 70  BILITOT 1.1  PROT 6.1*  ALBUMIN 3.1*   No results for input(s): LIPASE, AMYLASE in the last 168 hours. No results for input(s): AMMONIA in the last 168 hours. Coagulation Profile: No results for input(s): INR, PROTIME in the last 168 hours. Cardiac Enzymes: No results for input(s): CKTOTAL, CKMB, CKMBINDEX, TROPONINI in the last 168 hours. BNP (last 3 results) No results for input(s): PROBNP in the last 8760 hours. HbA1C: No results for input(s): HGBA1C in the last 72 hours. CBG: No results for input(s): GLUCAP in the last 168 hours. Lipid Profile: No results for input(s): CHOL, HDL, LDLCALC, TRIG, CHOLHDL, LDLDIRECT in the last 72 hours. Thyroid Function Tests: No results for input(s): TSH, T4TOTAL, FREET4, T3FREE, THYROIDAB in the last 72 hours. Anemia Panel: No results for input(s): VITAMINB12, FOLATE, FERRITIN, TIBC, IRON, RETICCTPCT in the last 72 hours. Sepsis Labs: Recent Labs  Lab 09/15/19 2121 09/16/19 0356  LATICACIDVEN 1.4 0.9    Recent Results (from the past 240 hour(s))  SARS CORONAVIRUS 2 (TAT 6-24 HRS) Nasopharyngeal Nasopharyngeal Swab     Status: None   Collection Time: 09/15/19 10:54 PM   Specimen: Nasopharyngeal Swab  Result Value Ref Range Status   SARS Coronavirus 2 NEGATIVE NEGATIVE Final    Comment: (NOTE) SARS-CoV-2 target nucleic acids are NOT DETECTED. The SARS-CoV-2 RNA is generally detectable in upper and lower respiratory specimens during the acute phase of infection. Negative results do not preclude SARS-CoV-2 infection, do not rule out co-infections with other pathogens, and should not be used as the sole basis for treatment or other patient management decisions. Negative results must be combined with clinical observations, patient history, and epidemiological information. The expected result is Negative. Fact Sheet for Patients: SugarRoll.be Fact Sheet for Healthcare  Providers: https://www.woods-mathews.com/ This test is not yet approved or cleared by the [place] FDA and  has been authorized for detection and/or diagnosis of SARS-CoV-2 by FDA under an Emergency Use Authorization (EUA). This EUA will remain  in effect (meaning this test can be used) for the duration of the COVID-19 declaration under Section 56 4(b)(1) of the Act, 21 U.S.C. section 360bbb-3(b)(1), unless the authorization is terminated or revoked sooner. Performed at [facility], [place] [address], [place], [place] [address]   Culture, Urine     Status: Abnormal   Collection Time: 09/16/19  4:03 AM   Specimen: Urine, Random  Result Value Ref Range Status   Specimen Description URINE, RANDOM  Final   Special Requests   Final    NONE Performed at [facility], [place] [address] [place], [place] [address]    Culture >=100,000 COLONIES/mL KLEBSIELLA PNEUMONIAE (A)  Final   Report Status 09/18/2019 FINAL  Final   Organism ID, Bacteria KLEBSIELLA PNEUMONIAE (A)  Final      Susceptibility   Klebsiella pneumoniae - MIC*    AMPICILLIN >=32 RESISTANT Resistant     CEFAZOLIN <=4 SENSITIVE Sensitive     CEFTRIAXONE <=1 SENSITIVE Sensitive     CIPROFLOXACIN <=0.25 SENSITIVE Sensitive     GENTAMICIN <=1 SENSITIVE Sensitive  IMIPENEM <=0.25 SENSITIVE Sensitive     NITROFURANTOIN 128 RESISTANT Resistant     TRIMETH/SULFA <=20 SENSITIVE Sensitive     AMPICILLIN/SULBACTAM 4 SENSITIVE Sensitive     PIP/TAZO <=4 SENSITIVE Sensitive     Extended ESBL NEGATIVE Sensitive     * >=100,000 COLONIES/mL KLEBSIELLA PNEUMONIAE         Radiology Studies: No results found.      Scheduled Meds: . aspirin EC  81 mg Oral Daily  . baclofen  20 mg Oral TID AC & HS  . Chlorhexidine Gluconate Cloth  6 each Topical Daily  . enoxaparin (LOVENOX) injection  40 mg Subcutaneous Daily  . influenza vaccine adjuvanted  0.5 mL Intramuscular Tomorrow-1000  . LORazepam  1 mg  Intravenous Q6H  . oxybutynin  5 mg Oral QID  . tiZANidine  4 mg Oral Q6H  . vitamin C  500 mg Oral Daily  . vitamin E  800 Units Oral Daily   Continuous Infusions: . cefTRIAXone (ROCEPHIN)  IV Stopped (09/19/19 0352)  . levETIRAcetam 500 mg (09/19/19 0919)     LOS: 3 days     Cordelia Poche, MD [facility] 09/19/2019, 10:55 AM  If 7PM-7AM, please contact night-coverage [email or web address]

## 2019-09-19 NOTE — Evaluation (Signed)
Physical Therapy Evaluation Patient Details Name: David Kim MRN: [id number] DOB: February 06, 1946 Today's Date: 09/19/2019   History of Present Illness  Pt is a 73 y/o M admitted on 09/15/2019 for confusion & wife believed pt to be having seizure. Pt found to have acute metabolic encephalopathy and klebsiella pneumonia UTI.  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  Clinical Impression  Pt currently requires +2 assist for bed mobility 2/2 impaired strength & trunk control. Pt would benefit from acute skilled PT treatment to focus on slide board transfers to drop arm recliner to simulate pt completing transfers to w/c at home. Pt's wife is supportive but pt needs to be more independent and require less physical assistance prior to d/c home. At this time d/c recommendations are home with HHPT & 24 hr supervision. Will continue to address dynamic sitting balance & slide board transfers.     Follow Up Recommendations Home health PT;Supervision/Assistance - 24 hour    Equipment Recommendations  (none anticipated at this time, pt already has w/c, slide board & hospital bed at home)    Recommendations for Other Services Speech consult(2/2 cognitive deficits reported by wife)     Precautions / Restrictions Precautions Precautions: Fall Precaution Comments: hx of paraplegia (from umbilicus down) for past ~9 years Restrictions Weight Bearing Restrictions: No      Mobility  Bed Mobility Overal bed mobility: Needs Assistance Bed Mobility: Sit to Supine;Supine to Sit     Supine to sit: Max assist;+2 for physical assistance;HOB elevated(wife provides physical assist at trunk) Sit to supine: Max assist;HOB elevated   General bed mobility comments: pt is able to assist with scooting to [facility] with bed in trendelenburg position and multimodal cuing for hand placement  Transfers                     Ambulation/Gait                Stairs            Wheelchair Mobility    Modified Rankin (Stroke Patients Only)       Balance Overall balance assessment: Needs assistance Sitting-balance support: Feet unsupported;Single extremity supported(LUE supported) Sitting balance-Leahy Scale: Poor Sitting balance - Comments: min assist for static sitting balance EOB ~ 8 minutes Postural control: Left lateral lean(pt's wife reports this is normal for pt to support himself with LUE)                                   Pertinent Vitals/Pain Pain Assessment: Faces Pain Score: 0-No pain    Home Living Family/patient expects to be discharged to:: Private residence Living Arrangements: Spouse/significant other Available Help at Discharge: Family;Available 24 hours/day Type of Home: House Home Access: Ramped entrance     Home Layout: One level        Prior Function Level of Independence: Needs assistance   Gait / Transfers Assistance Needed: pt's wife reports he required mod assist for bed mobility for BLE, some assistance for slide board transfers to his w/c, mod I in w/c, wife could leave pt alone if necessary           Hand Dominance        Extremity/Trunk Assessment        Lower Extremity Assessment Lower Extremity Assessment: (pt with hx of  spastic paraplegia)    Cervical / Trunk Assessment Cervical / Trunk Assessment: (forward head, rounded shoulders, posterior pelvic tilt)  Communication      Cognition Arousal/Alertness: Awake/alert   Overall Cognitive Status: Impaired/Different from baseline Area of Impairment: Orientation;Memory;Following commands;Safety/judgement;Awareness;Problem solving                 Orientation Level: Disoriented to;Situation   Memory: Decreased recall of precautions;Decreased short-term memory Following Commands: Follows one step commands consistently;Follows one step commands with increased  time Safety/Judgement: Decreased awareness of safety;Decreased awareness of deficits Awareness: Emergent Problem Solving: Slow processing General Comments: pt's wife reports pt is still hallucinating & his cognition is not back to baseline, pt reports prior to admission pt was able to be left alone at times at home      General Comments General comments (skin integrity, edema, etc.): would benefit from trying slide board transfers to drop arm recliner    Exercises     Assessment/Plan    PT Assessment Patient needs continued PT services  PT Problem List Decreased strength;Decreased mobility;Decreased safety awareness;Impaired tone;Decreased coordination;Decreased knowledge of precautions;Decreased range of motion;Decreased activity tolerance;Decreased cognition;Cardiopulmonary status limiting activity;Decreased skin integrity;Decreased balance;Decreased knowledge of use of DME;Pain       PT Treatment Interventions DME instruction;Therapeutic exercise;Wheelchair mobility training;Manual techniques;Balance training;Gait training;Stair training;Neuromuscular re-education;Functional mobility training;Cognitive remediation;Modalities;Therapeutic activities;Patient/family education    PT Goals (Current goals can be found in the Care Plan section)  Acute Rehab PT Goals Patient Stated Goal: to get better PT Goal Formulation: With patient/family Time For Goal Achievement: 10/03/19 Potential to Achieve Goals: Good    Frequency Min 3X/week   Barriers to discharge        Co-evaluation               AM-PAC PT "6 Clicks" Mobility  Outcome Measure Help needed turning from your back to your side while in a flat bed without using bedrails?: A Lot Help needed moving from lying on your back to sitting on the side of a flat bed without using bedrails?: A Lot Help needed moving to and from a bed to a chair (including a wheelchair)?: Total Help needed standing up from a chair using your arms  (e.g., wheelchair or bedside chair)?: Total Help needed to walk in hospital room?: Total Help needed climbing 3-5 steps with a railing? : Total 6 Click Score: 8    End of Session   Activity Tolerance: Patient tolerated treatment well Patient left: in bed;with call bell/phone within reach;with bed alarm set;with family/visitor present Nurse Communication: Mobility status PT Visit Diagnosis: Muscle weakness (generalized) (M62.81);Difficulty in walking, not elsewhere classified (R26.2)    Time: [id number] PT Time Calculation (min) (ACUTE ONLY): 16 min   Charges:   PT Evaluation $PT Eval High Complexity: 1 High             [name], PT, DPT 09/19/2019, 3:52 PM

## 2019-09-19 NOTE — Care Management Important Message (Signed)
Important Message  Patient Details  Name: David Kim MRN: [id number] Date of Birth: 18-Oct-1946   Medicare Important Message Given:  Yes     Orbie Pyo 09/19/2019, 2:39 PM

## 2019-09-20 MED ORDER — HALOPERIDOL LACTATE 5 MG/ML IJ SOLN
5.0000 mg | Freq: Once | INTRAMUSCULAR | Status: AC
  Administered 2019-09-20: 5 mg via INTRAMUSCULAR

## 2019-09-20 MED ORDER — HALOPERIDOL LACTATE 5 MG/ML IJ SOLN
5.0000 mg | Freq: Once | INTRAMUSCULAR | Status: DC
  Filled 2019-09-20: qty 1

## 2019-09-20 MED ORDER — LORAZEPAM 2 MG/ML IJ SOLN
1.0000 mg | Freq: Once | INTRAMUSCULAR | Status: AC
  Administered 2019-09-20: 1 mg via INTRAMUSCULAR
  Filled 2019-09-20: qty 1

## 2019-09-20 NOTE — Progress Notes (Signed)
PROGRESS NOTE    David Kim  [id number] DOB: 1946-06-26 DOA: 09/15/2019 PCP: Biagio Borg, MD   Brief Narrative: David Kim is a 73 y.o. male with history of paraplegia due to spinal tumor T10, with surgery 9 years ago, OSA on CPAP, neurogenic bladder and bowel. Patient presented secondary to seizures in setting of possible Baclofen toxicity   Assessment & Plan:   Principal Problem:   Acute metabolic encephalopathy Active Problems:   Paraplegia ([place])   Spinal cord injury, thoracic region [facility])   New onset seizure ([place])   Post-ictal state ([place])   Acute encephalopathy   Acute toxic encephalopathy In setting of Baclofen toxicity. Initially improved with adjustment of dose but now appears to be significantly agitated concerning for withdrawal. Renal function significantly improved which may have precipitated withdrawal. Now appears to be improving again with continued adjustment of Baclofen dose. Acute event overnight of paranoia. -Continue Baclofen 20 mg QID, will discuss to hopefully increase dose if safe as this may be continued mild withdrawal.  Seizures In setting of Baclofen toxicity. Started on Keppra but likely will not require long term treatment per neurology recommendations. EEG significant for generalized slowing.   Paraplegia Neurogenic bladder -Continue Baclofen (dose adjusted as mentioned above), tizanidine, oxybutynin  -PT/OT evaluations  Anxiety Will discuss possible transition back to Klonopin from Ativan with neurology  OSA -CPAP  UTI klebsiella pneumoniae. -Continue Ceftriaxone while inpatient and while mental status waxes/wanes  AKI Baseline creatinine of less than 1. Creatinine of 1.43 on admission. Resolved.   DVT prophylaxis: lovenox Code Status:   Code Status: Full Code Family Communication: None at bedside Disposition Plan: Discharge when mental status improved and pending PT recommendations   Consultants:   Neurology   Procedures:   11/10: EEG  IMPRESSION: This study is suggestive of moderate diffuse encephalopathy, nonspecific to etiology. No seizures or epileptiform discharges were seen throughout the recording.  Antimicrobials:  None    Subjective: Significant paranoia overnight per nurse report. Patient states people were trying to gas him with cyanide.  Objective: Vitals:   09/20/19 0013 09/20/19 0324 09/20/19 0814 09/20/19 0816  BP:   130/75 130/75  Pulse: (!) 101  97 97  Resp: 20  20 20   Temp: 98.2 F (36.8 C) 98.9 F (37.2 C) 98.8 F (37.1 C) 98.8 F (37.1 C)  TempSrc: Oral Axillary Oral Oral  SpO2: 92%  94% 94%  Weight:      Height:        Intake/Output Summary (Last 24 hours) at 09/20/2019 1057 Last data filed at 09/20/2019 0940 Gross per 24 hour  Intake 800 ml  Output 3200 ml  Net -2400 ml   Filed Weights   09/16/19 2013  Weight: 88 kg    Examination:  General exam: Appears calm and comfortable Respiratory system: Clear to auscultation. Respiratory effort normal. Cardiovascular system: S1 & S2 heard, RRR. No murmurs, rubs, gallops or clicks. Gastrointestinal system: Abdomen is nondistended, soft and nontender. No organomegaly or masses felt. Normal bowel sounds heard. Central nervous system: Alert and oriented x3. Extremities: No edema. No calf tenderness Skin: No cyanosis. No rashes Psychiatry: Judgement and insight appear impaired. Paranoid    Data Reviewed: I have personally reviewed following labs and imaging studies  CBC: Recent Labs  Lab 09/15/19 2121 09/17/19 0258  WBC 13.5* 8.0  NEUTROABS 11.5*  --   HGB 14.2 13.1  HCT 43.9 40.0  MCV 90.3 88.3  PLT 160 [id number]*   Basic Metabolic  Panel: Recent Labs  Lab 09/15/19 2121 09/16/19 0356 09/17/19 0258  NA 138 137 137  K 3.3* 3.9 3.8  CL 104 107 104  CO2 23 23 22   GLUCOSE 158* 118* 92  BUN 26* 23 15  CREATININE 1.43* 1.05 0.91  CALCIUM 8.7* 8.2* 8.2*   GFR: Estimated Creatinine  Clearance: 80.2 mL/min (by C-G formula based on SCr of 0.91 mg/dL). Liver Function Tests: Recent Labs  Lab 09/15/19 2121  AST 59*  ALT 24  ALKPHOS 70  BILITOT 1.1  PROT 6.1*  ALBUMIN 3.1*   No results for input(s): LIPASE, AMYLASE in the last 168 hours. No results for input(s): AMMONIA in the last 168 hours. Coagulation Profile: No results for input(s): INR, PROTIME in the last 168 hours. Cardiac Enzymes: No results for input(s): CKTOTAL, CKMB, CKMBINDEX, TROPONINI in the last 168 hours. BNP (last 3 results) No results for input(s): PROBNP in the last 8760 hours. HbA1C: No results for input(s): HGBA1C in the last 72 hours. CBG: No results for input(s): GLUCAP in the last 168 hours. Lipid Profile: No results for input(s): CHOL, HDL, LDLCALC, TRIG, CHOLHDL, LDLDIRECT in the last 72 hours. Thyroid Function Tests: No results for input(s): TSH, T4TOTAL, FREET4, T3FREE, THYROIDAB in the last 72 hours. Anemia Panel: No results for input(s): VITAMINB12, FOLATE, FERRITIN, TIBC, IRON, RETICCTPCT in the last 72 hours. Sepsis Labs: Recent Labs  Lab 09/15/19 2121 09/16/19 0356  LATICACIDVEN 1.4 0.9    Recent Results (from the past 240 hour(s))  SARS CORONAVIRUS 2 (TAT 6-24 HRS) Nasopharyngeal Nasopharyngeal Swab     Status: None   Collection Time: 09/15/19 10:54 PM   Specimen: Nasopharyngeal Swab  Result Value Ref Range Status   SARS Coronavirus 2 NEGATIVE NEGATIVE Final    Comment: (NOTE) SARS-CoV-2 target nucleic acids are NOT DETECTED. The SARS-CoV-2 RNA is generally detectable in upper and lower respiratory specimens during the acute phase of infection. Negative results do not preclude SARS-CoV-2 infection, do not rule out co-infections with other pathogens, and should not be used as the sole basis for treatment or other patient management decisions. Negative results must be combined with clinical observations, patient history, and epidemiological information. The expected  result is Negative. Fact Sheet for Patients: SugarRoll.be Fact Sheet for Healthcare Providers: https://www.woods-mathews.com/ This test is not yet approved or cleared by the [place] FDA and  has been authorized for detection and/or diagnosis of SARS-CoV-2 by FDA under an Emergency Use Authorization (EUA). This EUA will remain  in effect (meaning this test can be used) for the duration of the COVID-19 declaration under Section 56 4(b)(1) of the Act, 21 U.S.C. section 360bbb-3(b)(1), unless the authorization is terminated or revoked sooner. Performed at [facility], [place] [address] [place], [place] [address]   Culture, Urine     Status: Abnormal   Collection Time: 09/16/19  4:03 AM   Specimen: Urine, Random  Result Value Ref Range Status   Specimen Description URINE, RANDOM  Final   Special Requests   Final    NONE Performed at [facility], [place] [address], [place], [place] [address]    Culture >=100,000 COLONIES/mL KLEBSIELLA PNEUMONIAE (A)  Final   Report Status 09/18/2019 FINAL  Final   Organism ID, Bacteria KLEBSIELLA PNEUMONIAE (A)  Final      Susceptibility   Klebsiella pneumoniae - MIC*    AMPICILLIN >=32 RESISTANT Resistant     CEFAZOLIN <=4 SENSITIVE Sensitive     CEFTRIAXONE <=1 SENSITIVE Sensitive  CIPROFLOXACIN <=0.25 SENSITIVE Sensitive     GENTAMICIN <=1 SENSITIVE Sensitive     IMIPENEM <=0.25 SENSITIVE Sensitive     NITROFURANTOIN 128 RESISTANT Resistant     TRIMETH/SULFA <=20 SENSITIVE Sensitive     AMPICILLIN/SULBACTAM 4 SENSITIVE Sensitive     PIP/TAZO <=4 SENSITIVE Sensitive     Extended ESBL NEGATIVE Sensitive     * >=100,000 COLONIES/mL KLEBSIELLA PNEUMONIAE         Radiology Studies: No results found.      Scheduled Meds: . aspirin EC  81 mg Oral Daily  . baclofen  20 mg Oral TID AC & HS  . Chlorhexidine Gluconate Cloth  6 each Topical Daily  . enoxaparin (LOVENOX) injection   40 mg Subcutaneous Daily  . influenza vaccine adjuvanted  0.5 mL Intramuscular Tomorrow-1000  . LORazepam  1 mg Intravenous Q6H  . oxybutynin  5 mg Oral QID  . tiZANidine  4 mg Oral Q6H  . vitamin C  500 mg Oral Daily  . vitamin E  800 Units Oral Daily   Continuous Infusions: . sodium chloride Stopped (09/20/19 0700)  . cefTRIAXone (ROCEPHIN)  IV Stopped (09/19/19 0352)  . levETIRAcetam 500 mg (09/20/19 1020)     LOS: 4 days     Cordelia Poche, MD [facility] 09/20/2019, 10:57 AM  If 7PM-7AM, please contact night-coverage [email or web address]

## 2019-09-20 NOTE — Progress Notes (Signed)
Rehab Admissions Coordinator Note:  Per OT recommendation, this patient was screened by Raechel Ache for appropriateness for an Inpatient Acute Rehab Consult.  Per CM note, wife prefers pt return home with [facility] services. Feel pt may benefit from IP Rehab if he is interested. Will place consult order in chart. Will follow up for assessment Monday, IF pt remains in house.    Raechel Ache 09/20/2019, 3:46 PM  I can be reached at [phone].

## 2019-09-20 NOTE — Progress Notes (Signed)
Went in to put patient on CPAP and RN informed me that the wife of the patient did not want patient on CPAP tonight, they felt that the mask was not working for patient, patient had been taking the mask off during the night.  RN also informed me that patient ended up in restraints last night.  No distress is noted at this time, patient is sleeping.  Will continue to monitor.

## 2019-09-20 NOTE — Evaluation (Signed)
Occupational Therapy Evaluation Patient Details Name: David Kim MRN: [id number] DOB: 1946-10-19 Today's Date: 09/20/2019    History of Present Illness 73 y/o M admitted on 09/15/2019 for confusion & wife believed pt to be having seizure. Pt found to have acute metabolic encephalopathy and klebsiella pneumonia UTI.  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   Clinical Impression   PT admitted with UTI with PNA. Pt currently with functional limitiations due to the deficits listed below (see OT problem list). Pt currently with sustain attention at best during session and L lateral lean even supine in the bed. Pt must be supervision level static eob positioning for wife to progress to home. Pt currently requires mod (A) due to L lean.  Pt will benefit from skilled OT to increase their independence and safety with adls and balance to allow discharge CIR to decrease burden of care for caregiver for 7 days .     Follow Up Recommendations  CIR    Equipment Recommendations  None recommended by OT(defera at this time pending progress)    Recommendations for Other Services Rehab consult     Precautions / Restrictions Precautions Precautions: Fall Precaution Comments: hx of paraplegia (from umbilicus down) for past ~9 years Restrictions Weight Bearing Restrictions: No      Mobility Bed Mobility Overal bed mobility: Needs Assistance Bed Mobility: Sit to Supine;Supine to Sit     Supine to sit: Max assist;HOB elevated(wife provides physical assist at trunk) Sit to supine: Max assist;HOB elevated   General bed mobility comments: pt requires max (A) of pad to scoot to EOB. pt with bil Ue used to brace static sitting with gradual L lean. pt with L attention gaze this session. pt needs cues to find midline. wife reports this is new. pt drowzy this session.   Transfers                  General transfer comment: deferred to arousal and attenton only sustained    Balance Overall balance assessment: Needs assistance Sitting-balance support: Feet unsupported;Single extremity supported(LUE supported) Sitting balance-Leahy Scale: Poor Sitting balance - Comments: mod (A)  Postural control: Left lateral lean(pt's wife reports this is normal for pt to support himself with LUE)                                 ADL either performed or assessed with clinical judgement   ADL Overall ADL's : Needs assistance/impaired Eating/Feeding: Minimal assistance;Bed level   Grooming: Moderate assistance;Bed level   Upper Body Bathing: Maximal assistance;Bed level   Lower Body Bathing: Total assistance                         General ADL Comments: pt transfered to EOB with max (A) and then progressive lean to the L side with increase (A) from therapist. wife reports normally supervision level      Vision Baseline Vision/History: Wears glasses Wears Glasses: At all times       Perception     Praxis      Pertinent Vitals/Pain Pain Assessment: No/denies pain     Hand Dominance Right   Extremity/Trunk Assessment Upper Extremity Assessment Upper Extremity Assessment: Overall WFL for tasks assessed   Lower Extremity Assessment Lower Extremity Assessment: Defer to PT evaluation   Cervical /  Trunk Assessment Cervical / Trunk Assessment: Kyphotic(forward head, rounded shoulders, posterior pelvic tilt)   Communication Communication Communication: No difficulties   Cognition Arousal/Alertness: Lethargic Behavior During Therapy: Flat affect Overall Cognitive Status: Impaired/Different from baseline Area of Impairment: Orientation;Memory;Following commands;Safety/judgement;Awareness;Problem solving;Attention                 Orientation Level: Disoriented to;Situation;Person;Place;Time Current Attention Level: Focused Memory: Decreased recall of  precautions;Decreased short-term memory Following Commands: Follows one step commands consistently;Follows one step commands with increased time Safety/Judgement: Decreased awareness of safety;Decreased awareness of deficits Awareness: Intellectual Problem Solving: Slow processing General Comments: pt reports "they gave me something last night' pt unaware of reason for admission and insisting on d/c with wife home but states "she wont bring my chair" pt educated on reason for admission and settles   General Comments  wife reports Dr Naaman Plummer on CIR is patients PCP with previous injections in L LE for spasticity. pt currently maintain L LE in a hip abduction, knee flexion pattern. pt was positioned with pillow on lateral aspect of thigh and pillow under calf to place in midline with heel float. pt is high risk for skin break down due to inability to reposition. pt could benefit from mattress overaly [employer] logistics/support/administrative officer)    Exercises     Shoulder Instructions      Home Living Family/patient expects to be discharged to:: Private residence Living Arrangements: Spouse/significant other Available Help at Discharge: Family;Available 24 hours/day Type of Home: House Home Access: Ramped entrance     Home Layout: One level               Home Equipment: Wheelchair - power;Other (comment)(sliding board)   Additional Comments: pt sponge bath only.       Prior Functioning/Environment Level of Independence: Needs assistance  Gait / Transfers Assistance Needed: pt's wife reports he required mod assist for bed mobility for BLE, some assistance for slide board transfers to his w/c, mod I in w/c, wife could leave pt alone if necessary ADL's / Homemaking Assistance Needed: pt can feed himself and complete UB bathing supine. pt require wife (A) to swing to EOB and (A) to sliding board to wheelchair. pt was able to sustain supervision EOB for wife ot position chair            OT Problem List:  Decreased strength;Decreased activity tolerance;Impaired balance (sitting and/or standing);Decreased safety awareness;Decreased cognition;Decreased knowledge of use of DME or AE;Decreased knowledge of precautions      OT Treatment/Interventions: Self-care/ADL training;Therapeutic exercise;Neuromuscular education;Energy conservation;DME and/or AE instruction;Manual therapy;Therapeutic activities;Cognitive remediation/compensation;Patient/family education;Balance training    OT Goals(Current goals can be found in the care plan section) Acute Rehab OT Goals Patient Stated Goal: wife wants cream for lips due to injury to skin  OT Goal Formulation: With patient/family Time For Goal Achievement: 10/04/19 Potential to Achieve Goals: Good  OT Frequency: Min 2X/week   Barriers to D/C:            Co-evaluation              AM-PAC OT "6 Clicks" Daily Activity     Outcome Measure Help from another person eating meals?: A Little Help from another person taking care of personal grooming?: A Lot Help from another person toileting, which includes using toliet, bedpan, or urinal?: A Lot Help from another person bathing (including washing, rinsing, drying)?: A Lot Help from another person to put on and taking off regular upper body clothing?: A Lot Help  from another person to put on and taking off regular lower body clothing?: Total 6 Click Score: 12   End of Session Nurse Communication: Mobility status;Precautions  Activity Tolerance: Patient limited by lethargy Patient left: in bed;with call bell/phone within reach;with bed alarm set;with family/visitor present  OT Visit Diagnosis: Unsteadiness on feet (R26.81);Muscle weakness (generalized) (M62.81)                Time: [id number] OT Time Calculation (min): 15 min Charges:  OT General Charges $OT Visit: 1 Visit OT Evaluation $OT Eval Moderate Complexity: 1 Mod   Brynn, OTR/L  Acute Rehabilitation Services Pager:  [phone] Office: [phone] .   Jeri Modena 09/20/2019, 3:19 PM
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
## 2019-09-21 NOTE — Progress Notes (Signed)
PROGRESS NOTE    David Kim  [id number] DOB: [birth year] DOA: 09/15/2019 PCP: Biagio Borg, MD   Brief Narrative: David Kim is a 73 y.o. male with history of paraplegia due to spinal tumor T10, with surgery 9 years ago, OSA on CPAP, neurogenic bladder and bowel. Patient presented secondary to seizures in setting of possible Baclofen toxicity   Assessment & Plan:   Principal Problem:   Acute metabolic encephalopathy Active Problems:   Paraplegia ([place])   Spinal cord injury, thoracic region [facility])   New onset seizure ([place])   Post-ictal state ([place])   Acute encephalopathy   Acute toxic encephalopathy In setting of Baclofen toxicity. Initially improved with adjustment of dose but now appears to be significantly agitated concerning for withdrawal. Renal function significantly improved which may have precipitated withdrawal. Now appears to be improving again with continued adjustment of Baclofen dose. No issues overnight -Continue Baclofen 20 mg QID  Seizures In setting of Baclofen toxicity. Started on Keppra but likely will not require long term treatment per neurology recommendations. EEG significant for generalized slowing.   Paraplegia Neurogenic bladder -Continue Baclofen (dose adjusted as mentioned above), tizanidine, oxybutynin  -PT/OT evaluations: Considering CIR  Anxiety Will discuss possible transition back to Klonopin from Ativan with neurology  OSA -CPAP  UTI Klebsiella pneumoniae. Treated with Ceftriaxone.  AKI Baseline creatinine of less than 1. Creatinine of 1.43 on admission. Resolved.   DVT prophylaxis: lovenox Code Status:   Code Status: Full Code Family Communication: None at bedside Disposition Plan: Discharge likely in 24 hours   Consultants:   Neurology  Procedures:   11/10: EEG  IMPRESSION: This study is suggestive of moderate diffuse encephalopathy, nonspecific to etiology. No seizures or epileptiform discharges were seen  throughout the recording.  Antimicrobials:  None    Subjective: No concerns overnight  Objective: Vitals:   09/20/19 2259 09/21/19 0318 09/21/19 0730 09/21/19 1132  BP: 127/75 111/70 137/74 120/84  Pulse: 84 68  71  Resp: 18 18 16 18   Temp: 98.9 F (37.2 C) 98.9 F (37.2 C) 98.4 F (36.9 C) 98.5 F (36.9 C)  TempSrc: Oral Oral Oral Axillary  SpO2: 97% 95% 95% 96%  Weight:      Height:        Intake/Output Summary (Last 24 hours) at 09/21/2019 1404 Last data filed at 09/21/2019 1135 Gross per 24 hour  Intake 1045.12 ml  Output 2650 ml  Net -1604.88 ml   Filed Weights   09/16/19 2013  Weight: 88 kg    Examination:  General exam: Appears calm and comfortable Respiratory system: Clear to auscultation. Respiratory effort normal. Cardiovascular system: S1 & S2 heard, RRR. No murmurs, rubs, gallops or clicks. Gastrointestinal system: Abdomen is nondistended, soft and nontender. No organomegaly or masses felt. Normal bowel sounds heard. Central nervous system: Alert and oriented. No focal neurological deficits. Extremities: No edema. No calf tenderness Skin: No cyanosis. No rashes Psychiatry: Judgement and insight appear normal. Mood & affect appropriate.     Data Reviewed: I have personally reviewed following labs and imaging studies  CBC: Recent Labs  Lab 09/15/19 2121 09/17/19 0258  WBC 13.5* 8.0  NEUTROABS 11.5*  --   HGB 14.2 13.1  HCT 43.9 40.0  MCV 90.3 88.3  PLT 160 Q000111Q*   Basic Metabolic Panel: Recent Labs  Lab 09/15/19 2121 09/16/19 0356 09/17/19 0258  NA 138 137 137  K 3.3* 3.9 3.8  CL 104 107 104  CO2 23 23 22  GLUCOSE 158* 118* 92  BUN 26* 23 15  CREATININE 1.43* 1.05 0.91  CALCIUM 8.7* 8.2* 8.2*   GFR: Estimated Creatinine Clearance: 80.2 mL/min (by C-G formula based on SCr of 0.91 mg/dL). Liver Function Tests: Recent Labs  Lab 09/15/19 2121  AST 59*  ALT 24  ALKPHOS 70  BILITOT 1.1  PROT 6.1*  ALBUMIN 3.1*   No  results for input(s): LIPASE, AMYLASE in the last 168 hours. No results for input(s): AMMONIA in the last 168 hours. Coagulation Profile: No results for input(s): INR, PROTIME in the last 168 hours. Cardiac Enzymes: No results for input(s): CKTOTAL, CKMB, CKMBINDEX, TROPONINI in the last 168 hours. BNP (last 3 results) No results for input(s): PROBNP in the last 8760 hours. HbA1C: No results for input(s): HGBA1C in the last 72 hours. CBG: No results for input(s): GLUCAP in the last 168 hours. Lipid Profile: No results for input(s): CHOL, HDL, LDLCALC, TRIG, CHOLHDL, LDLDIRECT in the last 72 hours. Thyroid Function Tests: No results for input(s): TSH, T4TOTAL, FREET4, T3FREE, THYROIDAB in the last 72 hours. Anemia Panel: No results for input(s): VITAMINB12, FOLATE, FERRITIN, TIBC, IRON, RETICCTPCT in the last 72 hours. Sepsis Labs: Recent Labs  Lab 09/15/19 2121 09/16/19 0356  LATICACIDVEN 1.4 0.9    Recent Results (from the past 240 hour(s))  SARS CORONAVIRUS 2 (TAT 6-24 HRS) Nasopharyngeal Nasopharyngeal Swab     Status: None   Collection Time: 09/15/19 10:54 PM   Specimen: Nasopharyngeal Swab  Result Value Ref Range Status   SARS Coronavirus 2 NEGATIVE NEGATIVE Final    Comment: (NOTE) SARS-CoV-2 target nucleic acids are NOT DETECTED. The SARS-CoV-2 RNA is generally detectable in upper and lower respiratory specimens during the acute phase of infection. Negative results do not preclude SARS-CoV-2 infection, do not rule out co-infections with other pathogens, and should not be used as the sole basis for treatment or other patient management decisions. Negative results must be combined with clinical observations, patient history, and epidemiological information. The expected result is Negative. Fact Sheet for Patients: SugarRoll.be Fact Sheet for Healthcare Providers: https://www.woods-mathews.com/ This test is not yet approved or  cleared by the [place] FDA and  has been authorized for detection and/or diagnosis of SARS-CoV-2 by FDA under an Emergency Use Authorization (EUA). This EUA will remain  in effect (meaning this test can be used) for the duration of the COVID-19 declaration under Section 56 4(b)(1) of the Act, 21 U.S.C. section 360bbb-3(b)(1), unless the authorization is terminated or revoked sooner. Performed at [facility], [place] [address], [place], [place] [address]   Culture, Urine     Status: Abnormal   Collection Time: 09/16/19  4:03 AM   Specimen: Urine, Random  Result Value Ref Range Status   Specimen Description URINE, RANDOM  Final   Special Requests   Final    NONE Performed at [facility], [place] [address] [place], [place] [address]    Culture >=100,000 COLONIES/mL KLEBSIELLA PNEUMONIAE (A)  Final   Report Status 09/18/2019 FINAL  Final   Organism ID, Bacteria KLEBSIELLA PNEUMONIAE (A)  Final      Susceptibility   Klebsiella pneumoniae - MIC*    AMPICILLIN >=32 RESISTANT Resistant     CEFAZOLIN <=4 SENSITIVE Sensitive     CEFTRIAXONE <=1 SENSITIVE Sensitive     CIPROFLOXACIN <=0.25 SENSITIVE Sensitive     GENTAMICIN <=1 SENSITIVE Sensitive     IMIPENEM <=0.25 SENSITIVE Sensitive     NITROFURANTOIN 128 RESISTANT Resistant  TRIMETH/SULFA <=20 SENSITIVE Sensitive     AMPICILLIN/SULBACTAM 4 SENSITIVE Sensitive     PIP/TAZO <=4 SENSITIVE Sensitive     Extended ESBL NEGATIVE Sensitive     * >=100,000 COLONIES/mL KLEBSIELLA PNEUMONIAE         Radiology Studies: No results found.      Scheduled Meds: . aspirin EC  81 mg Oral Daily  . baclofen  20 mg Oral TID AC & HS  . Chlorhexidine Gluconate Cloth  6 each Topical Daily  . enoxaparin (LOVENOX) injection  40 mg Subcutaneous Daily  . influenza vaccine adjuvanted  0.5 mL Intramuscular Tomorrow-1000  . LORazepam  1 mg Intravenous Q6H  . oxybutynin  5 mg Oral QID  . tiZANidine  4 mg Oral Q6H  . vitamin C   500 mg Oral Daily  . vitamin E  800 Units Oral Daily   Continuous Infusions: . sodium chloride Stopped (09/20/19 0700)  . cefTRIAXone (ROCEPHIN)  IV Stopped (09/21/19 1132)  . levETIRAcetam 500 mg (09/21/19 0852)     LOS: 5 days     Cordelia Poche, MD [facility] 09/21/2019, 2:04 PM  If 7PM-7AM, please contact night-coverage [email or web address]

## 2019-09-21 NOTE — Progress Notes (Signed)
Checked on patient's CPAP machine.  Added a little extra distilled water for patient.  Patient seems to be more alert tonight, already had mask on and is able to fasten mask appropriately.  Machine is ready for patient to use.  Will continue to monitor.

## 2019-09-22 ENCOUNTER — Encounter (HOSPITAL_COMMUNITY): Payer: Self-pay

## 2019-09-22 ENCOUNTER — Other Ambulatory Visit: Payer: Self-pay

## 2019-09-22 ENCOUNTER — Inpatient Hospital Stay (HOSPITAL_COMMUNITY)
Admission: RE | Admit: 2019-09-22 | Discharge: 2019-10-01 | DRG: 945 | Disposition: A | Discharge status: Home Health | Payer: Medicare Other | Source: Intra-hospital | Attending: Physical Medicine & Rehabilitation | Admitting: Physical Medicine & Rehabilitation

## 2019-09-22 DIAGNOSIS — Z8546 Personal history of malignant neoplasm of prostate: Secondary | ICD-10-CM | POA: Diagnosis not present

## 2019-09-22 DIAGNOSIS — R569 Unspecified convulsions: Secondary | ICD-10-CM | POA: Diagnosis not present

## 2019-09-22 DIAGNOSIS — F329 Major depressive disorder, single episode, unspecified: Secondary | ICD-10-CM | POA: Diagnosis present

## 2019-09-22 DIAGNOSIS — G9340 Encephalopathy, unspecified: Secondary | ICD-10-CM | POA: Diagnosis present

## 2019-09-22 DIAGNOSIS — N390 Urinary tract infection, site not specified: Secondary | ICD-10-CM | POA: Diagnosis not present

## 2019-09-22 DIAGNOSIS — G92 Toxic encephalopathy: Secondary | ICD-10-CM

## 2019-09-22 DIAGNOSIS — R5381 Other malaise: Secondary | ICD-10-CM | POA: Diagnosis not present

## 2019-09-22 DIAGNOSIS — T428X5D Adverse effect of antiparkinsonism drugs and other central muscle-tone depressants, subsequent encounter: Secondary | ICD-10-CM | POA: Diagnosis not present

## 2019-09-22 DIAGNOSIS — R252 Cramp and spasm: Secondary | ICD-10-CM

## 2019-09-22 DIAGNOSIS — K5900 Constipation, unspecified: Secondary | ICD-10-CM | POA: Diagnosis present

## 2019-09-22 DIAGNOSIS — B961 Klebsiella pneumoniae [K. pneumoniae] as the cause of diseases classified elsewhere: Secondary | ICD-10-CM | POA: Diagnosis present

## 2019-09-22 DIAGNOSIS — Z833 Family history of diabetes mellitus: Secondary | ICD-10-CM

## 2019-09-22 DIAGNOSIS — Z85841 Personal history of malignant neoplasm of brain: Secondary | ICD-10-CM | POA: Diagnosis not present

## 2019-09-22 DIAGNOSIS — K592 Neurogenic bowel, not elsewhere classified: Secondary | ICD-10-CM | POA: Diagnosis present

## 2019-09-22 DIAGNOSIS — G839 Paralytic syndrome, unspecified: Secondary | ICD-10-CM

## 2019-09-22 DIAGNOSIS — S24109D Unspecified injury at unspecified level of thoracic spinal cord, subsequent encounter: Secondary | ICD-10-CM

## 2019-09-22 DIAGNOSIS — K219 Gastro-esophageal reflux disease without esophagitis: Secondary | ICD-10-CM | POA: Diagnosis present

## 2019-09-22 DIAGNOSIS — S24109A Unspecified injury at unspecified level of thoracic spinal cord, initial encounter: Secondary | ICD-10-CM | POA: Diagnosis present

## 2019-09-22 DIAGNOSIS — F419 Anxiety disorder, unspecified: Secondary | ICD-10-CM | POA: Diagnosis present

## 2019-09-22 DIAGNOSIS — Z880 Allergy status to penicillin: Secondary | ICD-10-CM

## 2019-09-22 DIAGNOSIS — Z79899 Other long term (current) drug therapy: Secondary | ICD-10-CM

## 2019-09-22 DIAGNOSIS — G8114 Spastic hemiplegia affecting left nondominant side: Secondary | ICD-10-CM | POA: Diagnosis present

## 2019-09-22 DIAGNOSIS — N3281 Overactive bladder: Secondary | ICD-10-CM | POA: Diagnosis present

## 2019-09-22 DIAGNOSIS — I10 Essential (primary) hypertension: Secondary | ICD-10-CM | POA: Diagnosis present

## 2019-09-22 DIAGNOSIS — Y92239 Unspecified place in hospital as the place of occurrence of the external cause: Secondary | ICD-10-CM | POA: Diagnosis not present

## 2019-09-22 DIAGNOSIS — X58XXXD Exposure to other specified factors, subsequent encounter: Secondary | ICD-10-CM | POA: Diagnosis present

## 2019-09-22 DIAGNOSIS — G959 Disease of spinal cord, unspecified: Secondary | ICD-10-CM | POA: Diagnosis present

## 2019-09-22 DIAGNOSIS — G4733 Obstructive sleep apnea (adult) (pediatric): Secondary | ICD-10-CM | POA: Diagnosis present

## 2019-09-22 DIAGNOSIS — A499 Bacterial infection, unspecified: Secondary | ICD-10-CM | POA: Diagnosis not present

## 2019-09-22 DIAGNOSIS — B9689 Other specified bacterial agents as the cause of diseases classified elsewhere: Secondary | ICD-10-CM

## 2019-09-22 DIAGNOSIS — M4714 Other spondylosis with myelopathy, thoracic region: Secondary | ICD-10-CM

## 2019-09-22 DIAGNOSIS — S24109S Unspecified injury at unspecified level of thoracic spinal cord, sequela: Secondary | ICD-10-CM

## 2019-09-22 DIAGNOSIS — N319 Neuromuscular dysfunction of bladder, unspecified: Secondary | ICD-10-CM | POA: Diagnosis present

## 2019-09-22 DIAGNOSIS — K521 Toxic gastroenteritis and colitis: Secondary | ICD-10-CM | POA: Diagnosis not present

## 2019-09-22 DIAGNOSIS — G8220 Paraplegia, unspecified: Secondary | ICD-10-CM | POA: Diagnosis present

## 2019-09-22 DIAGNOSIS — Z7982 Long term (current) use of aspirin: Secondary | ICD-10-CM

## 2019-09-22 DIAGNOSIS — R1310 Dysphagia, unspecified: Secondary | ICD-10-CM | POA: Diagnosis present

## 2019-09-22 DIAGNOSIS — Z888 Allergy status to other drugs, medicaments and biological substances status: Secondary | ICD-10-CM | POA: Diagnosis not present

## 2019-09-22 DIAGNOSIS — T3695XA Adverse effect of unspecified systemic antibiotic, initial encounter: Secondary | ICD-10-CM | POA: Diagnosis not present

## 2019-09-22 DIAGNOSIS — S64493A Injury of digital nerve of left middle finger, initial encounter: Secondary | ICD-10-CM | POA: Diagnosis present

## 2019-09-22 DIAGNOSIS — M810 Age-related osteoporosis without current pathological fracture: Secondary | ICD-10-CM | POA: Diagnosis present

## 2019-09-22 DIAGNOSIS — R159 Full incontinence of feces: Secondary | ICD-10-CM | POA: Diagnosis present

## 2019-09-22 DIAGNOSIS — B965 Pseudomonas (aeruginosa) (mallei) (pseudomallei) as the cause of diseases classified elsewhere: Secondary | ICD-10-CM | POA: Diagnosis present

## 2019-09-22 DIAGNOSIS — Z8711 Personal history of peptic ulcer disease: Secondary | ICD-10-CM

## 2019-09-22 DIAGNOSIS — R41841 Cognitive communication deficit: Secondary | ICD-10-CM | POA: Diagnosis present

## 2019-09-22 MED ORDER — PROCHLORPERAZINE MALEATE 5 MG PO TABS: 5.0000 mg | ORAL_TABLET | Freq: Four times a day (QID) | ORAL | Status: DC | PRN

## 2019-09-22 MED ORDER — ENOXAPARIN SODIUM 40 MG/0.4ML ~~LOC~~ SOLN
40.0000 mg | SUBCUTANEOUS | Status: DC
  Administered 2019-09-22 – 2019-09-30 (×9): 40 mg via SUBCUTANEOUS
  Filled 2019-09-22 (×9): qty 0.4

## 2019-09-22 MED ORDER — BACLOFEN 20 MG PO TABS
20.0000 mg | ORAL_TABLET | Freq: Three times a day (TID) | ORAL | Status: DC
  Administered 2019-09-22 – 2019-10-01 (×34): 20 mg via ORAL
  Filled 2019-09-22 (×34): qty 1

## 2019-09-22 MED ORDER — ONDANSETRON HCL 4 MG/2ML IJ SOLN: 4.0000 mg | Freq: Four times a day (QID) | INTRAMUSCULAR | Status: DC | PRN

## 2019-09-22 MED ORDER — TIZANIDINE HCL 4 MG PO TABS
4.0000 mg | ORAL_TABLET | Freq: Four times a day (QID) | ORAL | Status: DC
  Administered 2019-09-22 – 2019-10-01 (×33): 4 mg via ORAL
  Filled 2019-09-22 (×33): qty 1

## 2019-09-22 MED ORDER — ASPIRIN EC 81 MG PO TBEC
81.0000 mg | DELAYED_RELEASE_TABLET | Freq: Every day | ORAL | Status: DC
  Administered 2019-09-23 – 2019-10-01 (×9): 81 mg via ORAL
  Filled 2019-09-22 (×9): qty 1

## 2019-09-22 MED ORDER — GUAIFENESIN-DM 100-10 MG/5ML PO SYRP: 5.0000 mL | ORAL_SOLUTION | Freq: Four times a day (QID) | ORAL | Status: DC | PRN

## 2019-09-22 MED ORDER — PROCHLORPERAZINE 25 MG RE SUPP: 12.5000 mg | Freq: Four times a day (QID) | RECTAL | Status: DC | PRN

## 2019-09-22 MED ORDER — BISACODYL 10 MG RE SUPP
10.0000 mg | Freq: Every day | RECTAL | Status: DC
  Administered 2019-09-29: 10 mg via RECTAL
  Filled 2019-09-22 (×5): qty 1

## 2019-09-22 MED ORDER — ALUM & MAG HYDROXIDE-SIMETH 200-200-20 MG/5ML PO SUSP: 30.0000 mL | ORAL | Status: DC | PRN

## 2019-09-22 MED ORDER — ONDANSETRON HCL 4 MG PO TABS: 4.0000 mg | ORAL_TABLET | Freq: Four times a day (QID) | ORAL | Status: DC | PRN

## 2019-09-22 MED ORDER — FLEET ENEMA 7-19 GM/118ML RE ENEM: 1.0000 | ENEMA | Freq: Once | RECTAL | Status: DC | PRN

## 2019-09-22 MED ORDER — VITAMIN E 180 MG (400 UNIT) PO CAPS
800.0000 [IU] | ORAL_CAPSULE | Freq: Every day | ORAL | Status: DC
  Administered 2019-09-23 – 2019-10-01 (×9): 800 [IU] via ORAL
  Filled 2019-09-22 (×9): qty 2

## 2019-09-22 MED ORDER — POLYETHYLENE GLYCOL 3350 17 G PO PACK: 17.0000 g | PACK | Freq: Every day | ORAL | Status: DC | PRN

## 2019-09-22 MED ORDER — PROCHLORPERAZINE EDISYLATE 10 MG/2ML IJ SOLN: 5.0000 mg | Freq: Four times a day (QID) | INTRAMUSCULAR | Status: DC | PRN

## 2019-09-22 MED ORDER — ACETAMINOPHEN 325 MG PO TABS
325.0000 mg | ORAL_TABLET | ORAL | Status: DC | PRN
  Administered 2019-09-23: 650 mg via ORAL
  Filled 2019-09-22: qty 2

## 2019-09-22 MED ORDER — LEVETIRACETAM 500 MG PO TABS: 500.0000 mg | ORAL_TABLET | Freq: Two times a day (BID) | ORAL | Status: DC

## 2019-09-22 MED ORDER — OXYBUTYNIN CHLORIDE 5 MG PO TABS
5.0000 mg | ORAL_TABLET | Freq: Four times a day (QID) | ORAL | Status: DC
  Administered 2019-09-22 – 2019-10-01 (×34): 5 mg via ORAL
  Filled 2019-09-22 (×34): qty 1

## 2019-09-22 MED ORDER — EPINEPHRINE PF 1 MG/ML IJ SOLN
INTRAMUSCULAR | Status: AC
  Filled 2019-09-22: qty 1

## 2019-09-22 MED ORDER — CLONAZEPAM 0.5 MG PO TABS
1.0000 mg | ORAL_TABLET | Freq: Two times a day (BID) | ORAL | Status: DC
  Administered 2019-09-22 – 2019-10-01 (×18): 1 mg via ORAL
  Filled 2019-09-22 (×18): qty 2

## 2019-09-22 MED ORDER — VITAMIN C 500 MG PO TABS
500.0000 mg | ORAL_TABLET | Freq: Every day | ORAL | Status: DC
  Administered 2019-09-23 – 2019-10-01 (×9): 500 mg via ORAL
  Filled 2019-09-22 (×9): qty 1

## 2019-09-22 MED ORDER — BUPIVACAINE-EPINEPHRINE 0.5% -1:200000 IJ SOLN
INTRAMUSCULAR | Status: AC
  Filled 2019-09-22: qty 1

## 2019-09-22 MED ORDER — TRAZODONE HCL 50 MG PO TABS
25.0000 mg | ORAL_TABLET | Freq: Every evening | ORAL | Status: DC | PRN
  Filled 2019-09-22: qty 1

## 2019-09-22 NOTE — Discharge Instructions (Signed)
David Kim,  You were in the hospital because of confusion caused by too much Baclofen in the setting of a urinary infection and mild kidney insufficiency. Your Baclofen dose has been adjusted. Please follow-up with your neurologist for re-adjustment of your Baclofen dosing.
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
## 2019-09-22 NOTE — TOC Transition Note (Signed)
Transition of Care [facility]) - CM/SW Discharge Note   Patient Details  Name: PIERE SUMSION MRN: [id number] Date of Birth: 06-23-1946  Transition of Care [facility]) CM/SW Contact:  Pollie Friar, RN Phone Number: 09/22/2019, 2:14 PM   Clinical Narrative:    Pt is discharging to CIR today. CM is signing off.   Final next level of care: IP Rehab Facility Barriers to Discharge: No Barriers Identified   Patient Goals and CMS Choice Patient states their goals for this hospitalization and ongoing recovery are:: spoke w wife who wants him to go home CMS Medicare.gov Compare Post Acute Care list provided to:: Other (Comment Required) Choice offered to / list presented to : Spouse  Discharge Placement                       Discharge Plan and Services                          HH Arranged: RN, PT, OT, Nurse's Aide [place] Agency: [facility](home first) Date [place]: 09/20/19 Time [place]: 1014 Representative spoke with at [place]: cory  Social Determinants of Health ([place]) Interventions     Readmission Risk Interventions No flowsheet data found.

## 2019-09-22 NOTE — PMR Pre-admission (Signed)
PMR Admission Coordinator Pre-Admission Assessment  Patient: David Kim is an 73 y.o., male MRN: [id number] DOB: 16-Dec-1945 Height: '5\' 8"'  (172.7 cm) Weight: 88 kg              Insurance Information HMO:     PPO:      PCP:      IPA:      80/20: yes     OTHER:  PRIMARY: Medicare Part A and B      Policy#: [id number]      Subscriber: Patient CM Name:       Phone#:      Fax#:  Pre-Cert#:       Employer:  Benefits:  Phone #: online     Name: verified eligibility via [place] on 09/22/2019 Eff. Date: Part A and B 01/04/2013     Deduct: $1,408      Out of Pocket Max: NA      Life Max: NA CIR: Covered per Medicare Guidelines once yearly deductible has been met      SNF: days 1-20, 100%, days 21-100, 80% Outpatient: 80%     Co-Pay: 20% Home Health: 100%      Co-Pay:  DME: 80%     Co-Pay: 20% Providers: Pt's choice  SECONDARY: BCBS      Policy#: [id number]      Subscriber: Patient CM Name:       Phone#:      Fax#:  Pre-Cert#:       Employer:  Benefits:  Phone #: [phone]     Name:  Eff. Date:      Deduct:       Out of Pocket Max:       Life Max: CIR:       SNF:  Outpatient:     Co-Pay:  Home Health:      Co-Pay:  DME:      Co-Pay:   Medicaid Application Date:       Case Manager:  Disability Application Date:       Case Worker:   The "Data Collection Information Summary" for patients in Inpatient Rehabilitation Facilities with attached "Privacy Act [place] Records" was provided and verbally reviewed with: Patient and Family  Emergency Contact Information Contact Information    Name Relation Home Work Mobile   Demeo,Connie W Spouse [phone]  [phone]     Current Medical History  Patient Admitting Diagnosis: Functional decline due to urosepsis  History of Present Illness: David Kim is a 73 y.o. male with history of HTN, OSA, paraplegia with spastic hemiplegia due to spinal tumor and was admitted on 09/15/19 with reports of one week history of mild  confusion progressing to lethargy for 48 hours followed by seizure leading to admission.  He was found to have acute renal failure with  Klebsiella pneumonia UTI in setting of high doses of baclofen. He was started on IVF for hydration as well as IV antibiotics. CT head/MRI brain negative for acute changes and Dr. Lorraine Lax felt that MS changes due to baclofen toxicity and dose decrease. EEG negative for Mental status has continued to wax and wane with significant bouts of agitation question due to baclofen withdrawal therefore dose increased to 20 mg qid.  Pt has been evaluated by therapies with recommendation for CIR. Pt is to be admitted to CIR on 09/22/2019.    Glasgow Coma Scale Score: 15  Past Medical History  Past Medical History:  Diagnosis Date  . Allergic rhinitis   .  ALLERGIC RHINITIS 10/01/2007   Qualifier: Diagnosis of  By: Jenny Reichmann MD, Hunt Oris   . Anxiety   . Carpal tunnel syndrome    right  . Depression   . Depression   . Erectile dysfunction   . Fatigue   . GERD (gastroesophageal reflux disease)   . Gout   . Hypertension   . HYPOGONADISM 03/31/2008   Qualifier: Diagnosis of  By: Jenny Reichmann MD, Hunt Oris   . IBS (irritable bowel syndrome)   . Low back pain   . OSA (obstructive sleep apnea)   . Paraplegia ([place]) 04/23/2009   Qualifier: Diagnosis of  By: Jenny Reichmann MD, Hunt Oris   . Peptic ulcer disease   . PEPTIC ULCER DISEASE 10/01/2007   Qualifier: Diagnosis of  By: Jenny Reichmann MD, Hunt Oris   . Prostate cancer [facility])   . PROSTATE CANCER, HX OF 05/23/2007   Qualifier: Diagnosis of  By: Jenny Reichmann MD, [place] PARALYSIS 04/23/2009   Qualifier: Diagnosis of  By: Jenny Reichmann MD, Hunt Oris   . Thoracic spinal cord injury ([place]) 03/12/2012    Family History  family history includes Dementia in his mother; Diabetes in his father; High blood pressure in his mother.  Prior Rehab/Hospitalizations:  Has the patient had prior rehab or hospitalizations prior to admission? Yes  Has the patient had major surgery  during 100 days prior to admission? No  Current Medications   Current Facility-Administered Medications:  .  0.9 %  sodium chloride infusion, , Intravenous, PRN, Mariel Aloe, MD, Stopped at 09/20/19 0700 .  acetaminophen (TYLENOL) tablet 650 mg, 650 mg, Oral, Q6H PRN **OR** acetaminophen (TYLENOL) suppository 650 mg, 650 mg, Rectal, Q6H PRN, Etta Quill, DO .  aspirin EC tablet 81 mg, 81 mg, Oral, Daily, Etta Quill, DO, 81 mg at 09/22/19 0845 .  baclofen (LIORESAL) tablet 20 mg, 20 mg, Oral, TID AC & HS, Tyrion, Glaude, PA-C, 20 mg at 09/22/19 1259 .  cefTRIAXone (ROCEPHIN) 1 g in sodium chloride 0.9 % 100 mL IVPB, 1 g, Intravenous, Q24H, Gardner, Jared M, DO, Last Rate: 200 mL/hr at 09/22/19 0426, 1 g at 09/22/19 0426 .  Chlorhexidine Gluconate Cloth 2 % PADS 6 each, 6 each, Topical, Daily, Kc, Ramesh, MD, 6 each at 09/22/19 0846 .  enoxaparin (LOVENOX) injection 40 mg, 40 mg, Subcutaneous, Daily, Jennette Kettle M, DO, 40 mg at 09/22/19 0844 .  influenza vaccine adjuvanted (FLUAD) injection 0.5 mL, 0.5 mL, Intramuscular, Tomorrow-1000, Kc, Ramesh, MD .  levETIRAcetam (KEPPRA) IVPB 500 mg/100 mL premix, 500 mg, Intravenous, Q12H, Etta Quill, DO, Last Rate: 400 mL/hr at 09/22/19 0843, 500 mg at 09/22/19 0843 .  LORazepam (ATIVAN) injection 1 mg, 1 mg, Intravenous, Q6H, Gardner, Jared M, DO, 1 mg at 09/22/19 1300 .  ondansetron (ZOFRAN) tablet 4 mg, 4 mg, Oral, Q6H PRN **OR** ondansetron (ZOFRAN) injection 4 mg, 4 mg, Intravenous, Q6H PRN, Etta Quill, DO .  oxybutynin (DITROPAN) tablet 5 mg, 5 mg, Oral, QID, Alcario Drought, Jared M, DO, 5 mg at 09/22/19 1303 .  tiZANidine (ZANAFLEX) tablet 4 mg, 4 mg, Oral, Q6H, Gardner, Jared M, DO, 4 mg at 09/22/19 1259 .  vitamin C (ASCORBIC ACID) tablet 500 mg, 500 mg, Oral, Daily, Jennette Kettle M, DO, 500 mg at 09/22/19 0843 .  vitamin E capsule 800 Units, 800 Units, Oral, Daily, Etta Quill, DO, 800 Units at 09/22/19 7262  Patients  Current Diet:  Diet Order  DIET DYS 3 Room service appropriate? Yes with Assist; Fluid consistency: Thin  Diet effective now              Precautions / Restrictions Precautions Precautions: Fall Precaution Comments: hx of paraplegia (from umbilicus down) for past ~9 years Restrictions Weight Bearing Restrictions: No   Has the patient had 2 or more falls or a fall with injury in the past year?No  Prior Activity Level Limited Community (1-2x/wk): limited to MD appointments; wife assists as needed  Prior Functional Level Prior Function Level of Independence: Needs assistance Gait / Transfers Assistance Needed: pt's wife reports he required mod assist for bed mobility for BLE, some assistance for slide board transfers to his w/c, mod I in w/c, wife could leave pt alone if necessary ADL's / Homemaking Assistance Needed: pt can feed himself and complete UB bathing supine. pt require wife (A) to swing to EOB and (A) to sliding board to wheelchair. pt was able to sustain supervision EOB for wife ot position chair  Self Care: Did the patient need help bathing, dressing, using the toilet or eating?  Needed some help (set up/Supervision)  Indoor Mobility: Did the patient need assistance with walking from room to room (with or without device)? Independent via wc  Stairs: Did the patient need assistance with internal or external stairs (with or without device)? Dependent  Functional Cognition: Did the patient need help planning regular tasks such as shopping or remembering to take medications? Independent  Home Equities trader / Equipment Home Assistive Devices/Equipment: Wheelchair Home Equipment: Wheelchair - power, Other (comment)(sliding board)  Prior Device Use: Indicate devices/aids used by the patient prior to current illness, exacerbation or injury? Manual wheelchair, slideboards  Current Functional Level Cognition  Arousal/Alertness: Awake/alert Overall Cognitive  Status: Within Functional Limits for tasks assessed Current Attention Level: Focused Orientation Level: Oriented to person, Oriented to place, Disoriented to situation, Disoriented to time Following Commands: Follows one step commands with increased time Safety/Judgement: Decreased awareness of safety, Decreased awareness of deficits General Comments: patient more alert and oriented this visit, wife present Attention: Focused, Sustained Focused Attention: Appears intact Sustained Attention: Impaired Sustained Attention Impairment: Verbal basic Memory: Impaired Memory Impairment: Storage deficit, Retrieval deficit, Decreased recall of new information, Decreased short term memory, Prospective memory Awareness: Impaired Awareness Impairment: Intellectual impairment, Emergent impairment, Anticipatory impairment Problem Solving: Impaired Problem Solving Impairment: Verbal basic Executive Function: Reasoning, Organizing Reasoning: Impaired Reasoning Impairment: Verbal basic Organizing: Impaired Organizing Impairment: Verbal basic Safety/Judgment: Impaired    Extremity Assessment (includes Sensation/Coordination)  Upper Extremity Assessment: Overall WFL for tasks assessed  Lower Extremity Assessment: Defer to PT evaluation    ADLs  Overall ADL's : Needs assistance/impaired Eating/Feeding: Minimal assistance, Bed level Grooming: Moderate assistance, Bed level Upper Body Bathing: Maximal assistance, Bed level Lower Body Bathing: Total assistance General ADL Comments: pt transfered to EOB with max (A) and then progressive lean to the L side with increase (A) from therapist. wife reports normally supervision level     Mobility  Overal bed mobility: Needs Assistance Bed Mobility: Supine to Sit Supine to sit: Mod assist Sit to supine: Max assist, HOB elevated General bed mobility comments: requires mod assist to bring LEs off side of bed, patient is able to use bed rail to sit trunk up     Transfers  Overall transfer level: Needs assistance Equipment used: Sliding board Transfers: Lateral/Scoot Transfers  Lateral/Scoot Transfers: Min assist General transfer comment: min assist to position sliding board, cues for scooting and  positioning.    Ambulation / Gait / Stairs / Wheelchair Mobility  Ambulation/Gait General Gait Details: non-ambulatory    Posture / Balance Dynamic Sitting Balance Sitting balance - Comments: mod (A)  Balance Overall balance assessment: Needs assistance Sitting-balance support: Bilateral upper extremity supported, Feet supported Sitting balance-Leahy Scale: Fair Sitting balance - Comments: mod (A)  Postural control: Left lateral lean(pt's wife reports this is normal for pt to support himself with LUE)    Special needs/care consideration BiPAP/CPAP: yes CPAP at home  CPM: no Continuous Drip IV: no Dialysis: no        Days: no Life Vest; no Oxygen: RA Special Bed: seizure precautions Trach Size: no Wound Vac (area): no      Location :no Skin: abrasion to upper and lower lip                        Bowel mgmt: last BM: 09/17/2019, per chart wife does dig stim Bladder mgmt: urinary catheter Diabetic mgmt: no Behavioral consideration : no Chemo/radiation : no     Previous Home Environment (from acute therapy documentation) Living Arrangements: Spouse/significant other  Lives With: Spouse Available Help at Discharge: Family, Available 24 hours/day Type of Home: House Home Layout: One level Home Access: Ramped entrance Cyrus: No Additional Comments: pt sponge bath only.   Discharge Living Setting Plans for Discharge Living Setting: Patient's home, Lives with (comment)(wife) Type of Home at Discharge: House Discharge Home Layout: One level Discharge Home Access: [place] entrance Discharge Bathroom Shower/Tub: Other (comment)(pt bathes and dresses bedlevel) Discharge Bathroom Toilet: Handicapped height(via drop arm BSC  (usually toilets in bed)) Discharge Bathroom Accessibility: Yes How Accessible: Accessible via wheelchair Does the patient have any problems obtaining your medications?: No  Social/Family/Support Systems Patient Roles: Spouse Contact Information: wife: [name] [phone] (home: [phone]) Anticipated Caregiver: wife Anticipated Caregiver's Contact Information: see above Ability/Limitations of Caregiver: Min  Caregiver Availability: 24/7 Discharge Plan Discussed with Primary Caregiver: Yes Is Caregiver In Agreement with Plan?: Yes Does Caregiver/Family have Issues with Lodging/Transportation while Pt is in Rehab?: No   Goals/Additional Needs Patient/Family Goal for Rehab: PT/OT: Supervision; SLP: NA Expected length of stay: 5-7 days Cultural Considerations: NA Dietary Needs: DYS 3 diet, thin liquids; room service with assist.  Equipment Needs: TBD Special Service Needs: paraplegia at baseline  Pt/Family Agrees to Admission and willing to participate: Yes Program Orientation Provided & Reviewed with Pt/Caregiver Including Roles  & Responsibilities: Yes(pt and wife)  Barriers to Discharge: Neurogenic Bowel & Bladder   Decrease burden of Care through IP rehab admission: NA   Possible need for SNF placement upon discharge:Not anticipated; pt's wife is capable of providing physical assist at DC and pt and family understand the plan is to return home once able.    Patient Condition: This patient's condition remains as documented in the consult dated 09/22/2019, in which the Rehabilitation Physician determined and documented that the patient's condition is appropriate for intensive rehabilitative care in an inpatient rehabilitation facility. Will admit to inpatient rehab today.  Preadmission Screen Completed By:  Raechel Ache, OT, 09/22/2019 2:17 PM ______________________________________________________________________   Discussed status with Dr. Naaman Plummer on 09/22/2019 at 2:07PM and  received approval for admission today.  Admission Coordinator:  Raechel Ache, time 2:07PM/Date11/16/2020

## 2019-09-22 NOTE — Progress Notes (Signed)
Patient arrived to unit to room 4W13, no s/s of distress. Patient has not had a bowel movement in one week, patient/family advised of bowel program. Patient spouse refused bowel program at this time stating "she doesn't want patient to be given too much, being that everytime he receives something he has diarrhea for three days." Patient agrees with spouse and states "his wife has been taking care of him for 10 years." No further complications noted at this time.  Audie Clear, LPN

## 2019-09-22 NOTE — Consult Note (Signed)
Physical Medicine and Rehabilitation Consult  Reason for Consult: Functional decline due to baclofen toxicity/seizures Referring Physician: Dr. Lonny Prude   HPI: David Kim is a 73 y.o. male with history of HTN, OSA, paraplegia with spastic hemiplegia due to spinal tumor and was admitted on 09/15/19 with reports of one week history of mild confusion progressing to lethargy for 48 hours followed by seizure leading to admission.  He was found ot have acute renal failure with  Klebsiella pneumonia UTI in setting of high doses of baclofen. He was started on IVF for hydration as well as IV antibiotics. CT head/MRI brain negative for acute changes and Dr. Lorraine Lax felt that MS changes due to baclofen toxicity and dose decrease. EEG negative for Mental status has continued to wax and wane with significant bouts of agitation question due to baclofen withdrawal therefore dose increased to 20 mg qid.    ROS as indicated below. Also with + constipation. Wife usually performs digital stimulation for patient at home but has not at this time since he has mostly been consuming liquids and feels comfortable.   At baseline, patient uses sliding board for transfers and receives assistance from wife. He is able to get in car to get to his medical appointments. He would prefer to be discharged home if possible as he fears risk of COVID while in hospital.   Review of Systems  Constitutional: Negative for chills and fever.  HENT: Negative for hearing loss.   Eyes: Negative for blurred vision and double vision.  Respiratory: Negative for cough and hemoptysis.   Cardiovascular: Negative for chest pain and palpitations.  Gastrointestinal: Negative for heartburn and nausea.  Genitourinary: Negative for dysuria.  Musculoskeletal: Negative for myalgias and neck pain.  Neurological: Positive for sensory change (no sensation from waist down) and weakness.  Psychiatric/Behavioral: Negative for memory loss.       Past Medical History:  Diagnosis Date  . Allergic rhinitis   . ALLERGIC RHINITIS 10/01/2007   Qualifier: Diagnosis of  By: Jenny Reichmann MD, Hunt Oris   . Anxiety   . Carpal tunnel syndrome    right  . Depression   . Depression   . Erectile dysfunction   . Fatigue   . GERD (gastroesophageal reflux disease)   . Gout   . Hypertension   . HYPOGONADISM 03/31/2008   Qualifier: Diagnosis of  By: Jenny Reichmann MD, Hunt Oris   . IBS (irritable bowel syndrome)   . Low back pain   . OSA (obstructive sleep apnea)   . Paraplegia ([place]) 04/23/2009   Qualifier: Diagnosis of  By: Jenny Reichmann MD, Hunt Oris   . Peptic ulcer disease   . PEPTIC ULCER DISEASE 10/01/2007   Qualifier: Diagnosis of  By: Jenny Reichmann MD, Hunt Oris   . Prostate cancer [facility])   . PROSTATE CANCER, HX OF 05/23/2007   Qualifier: Diagnosis of  By: Jenny Reichmann MD, [place] PARALYSIS 04/23/2009   Qualifier: Diagnosis of  By: Jenny Reichmann MD, Hunt Oris   . Thoracic spinal cord injury ([place]) 03/12/2012    Past Surgical History:  Procedure Laterality Date  . ARTERY AND TENDON REPAIR Left 09/06/2018   Procedure: ARTERY AND TENDON REPAIR;  Surgeon: Charlotte Crumb, MD;  Location: [place];  Service: Orthopedics;  Laterality: Left;  . CAST APPLICATION Left [id number]   Procedure: CAST APPLICATION;  Surgeon: Charlotte Crumb, MD;  Location: [place];  Service: Orthopedics;  Laterality: Left;  . inguinal heniorrhaphy    .  left leg surgery after fibula    . NERVE REPAIR Left 09/06/2018   Procedure: NERVE REPAIR TIMES TWO;  Surgeon: Charlotte Crumb, MD;  Location: [place];  Service: Orthopedics;  Laterality: Left;  . OPEN REDUCTION INTERNAL FIXATION (ORIF) DISTAL PHALANX Left 09/06/2018   Procedure: OPEN REDUCTION INTERNAL FIXATION (ORIF) LEFT LONG FINGER;  Surgeon: Charlotte Crumb, MD;  Location: [place];  Service: Orthopedics;  Laterality: Left;  . PILONIDAL CYST / SINUS EXCISION    . PROSTATECTOMY    . tonsillectomy    . WOUND EXPLORATION Left 09/06/2018   Procedure: EXPLORATION  OFCOMPLEX INJURY;  Surgeon: Charlotte Crumb, MD;  Location: [place];  Service: Orthopedics;  Laterality: Left;    Family History  Problem Relation Age of Onset  . High blood pressure Mother   . Dementia Mother   . 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 He reports that he has never smoked. He has never used smokeless tobacco. He reports that he does not drink alcohol or use drugs.    Allergies  Allergen Reactions  . Amoxicillin Other (See Comments)  . Endal Hd Other (See Comments)    Medications Prior to Admission  Medication Sig Dispense Refill  . antiseptic oral rinse (BIOTENE) LIQD 15 mLs by Mouth Rinse route as needed for dry mouth.    Marland Kitchen aspirin (ECOTRIN LOW STRENGTH) 81 MG EC tablet Take 81 mg by mouth daily.      . baclofen (LIORESAL) 10 MG tablet Take 1 tablet (10 mg total) by mouth 4 (four) times daily. Taking additional 10mg  tablet (1/2 tab in 0500 and 1700). 360 each 4  . baclofen (LIORESAL) 20 MG tablet Take 1 tablet (20 mg total) by mouth 4 (four) times daily. 360 tablet 4  . CALCIUM-MAGNESIUM PO Take 1 tablet by mouth daily.     . cholecalciferol (VITAMIN D3) 25 MCG (1000 UT) tablet Take 1,000 Units by mouth daily.    . clonazePAM (KLONOPIN) 2 MG tablet Take 2 tablets (4 mg total) by mouth at bedtime. 180 tablet 3  . losartan (COZAAR) 50 MG tablet Take 1 tablet (50 mg total) by mouth 2 (two) times daily. 180 tablet 3  . Multiple Vitamin (MULTIVITAMIN) capsule Take 1 capsule by mouth daily.    Marland Kitchen OVER THE COUNTER MEDICATION Take 22.5 mLs by mouth daily. Takes 1.5 tbsp  Mesotrace MV powder    . oxybutynin (DITROPAN) 5 MG tablet Take 5 mg by mouth 4 (four) times daily.     Marland Kitchen tiZANidine (ZANAFLEX) 4 MG tablet take 1-2 tablets by mouth four times a day 620 tablet 3  . vitamin C (ASCORBIC ACID) 500 MG tablet Take 500 mg by mouth daily.    . vitamin E (VITAMIN E) 400 UNIT capsule Take 800 Units by  mouth daily.    . Zinc Oxide (SECURA EXTRA PROTECTIVE EX) Apply 1 application topically daily. To groin area      Home: Home Living Family/patient expects to be discharged to:: Private residence Living Arrangements: Spouse/significant other Available Help at Discharge: Family, Available 24 hours/day Type of Home: House Home Access: [place]: [place]: [place], Other (comment)(sliding board) Additional Comments: pt sponge bath only.   Functional History: Prior Function Level of Independence: Needs assistance Gait / Transfers Assistance Needed: pt's wife reports he required mod assist for bed mobility for BLE, some assistance for slide board  transfers to his w/c, mod I in w/c, wife could leave pt alone if necessary ADL's / Homemaking Assistance Needed: pt can feed himself and complete UB bathing supine. pt require wife (A) to swing to EOB and (A) to sliding board to wheelchair. pt was able to sustain supervision EOB for wife ot position chair Functional Status:  Mobility: Bed Mobility Overal bed mobility: Needs Assistance Bed Mobility: Sit to Supine, Supine to Sit Supine to sit: Max assist, HOB elevated(wife provides physical assist at trunk) Sit to supine: Max assist, HOB elevated General bed mobility comments: pt requires max (A) of pad to scoot to EOB. pt with bil Ue used to brace static sitting with gradual L lean. pt with L attention gaze this session. pt needs cues to find midline. wife reports this is new. pt drowzy this session.  Transfers General transfer comment: deferred to arousal and [place]enton only sustained      ADL: ADL Overall ADL's : Needs assistance/impaired Eating/Feeding: Minimal assistance, Bed level Grooming: Moderate assistance, Bed level Upper Body Bathing: Maximal assistance, Bed level Lower Body Bathing: Total assistance General ADL Comments: pt transfered to EOB with max (A) and then progressive lean to the L  side with increase (A) from therapist. wife reports normally supervision level   Cognition: Cognition Overall Cognitive Status: Impaired/Different from baseline Orientation Level: Oriented X4 Cognition Arousal/Alertness: Lethargic Behavior During Therapy: Flat affect Overall Cognitive Status: Impaired/Different from baseline Area of Impairment: Orientation, Memory, Following commands, Safety/judgement, Awareness, Problem solving, Attention Orientation Level: Disoriented to, Situation, Person, Place, Time Current Attention Level: Focused Memory: Decreased recall of precautions, Decreased short-term memory Following Commands: Follows one step commands consistently, Follows one step commands with increased time Safety/Judgement: Decreased awareness of safety, Decreased awareness of deficits Awareness: Intellectual Problem Solving: Slow processing General Comments: pt reports "they gave me something last night' pt unaware of reason for admission and insisting on d/c with wife home but states "she wont bring my chair" pt educated on reason for admission and settles   Blood pressure 107/61, pulse 69, temperature 98 F (36.7 C), resp. rate 20, height 5\' 8"  (1.727 m), weight 88 kg, SpO2 98 %. Physical Exam  Nursing note and vitals reviewed. Constitutional: He appears well-developed and well-nourished.  Sitting up in bed.   Cardiac: No edema. Normal S1 and S2. RRR.  Respiratory: No stridor.  GI: He exhibits no distension. There is no abdominal tenderness.  Neurological/MSK: He is alert. Oriented to self and place---not to situation. Slow to answer.  Paraplegia with LLE significantly flexed at knee so leg is underneath body.  Sensation intact in bilateral UE, absent in bilateral LE. Intact at T10 and above. Normal finger to nose bilaterally. CN 2-12 intact. 5/5 strength bilaterally in upper extremities; trace movement in LLE. Significant spasticity in bilateral lower extremities, possible LLE  knee flexion contracture.  Psychiatry: Normal insight, judgement, mood. Flat affect.  Skin: No cyanosis or rashes  No results found for this or any previous visit (from the past 24 hour(s)). No results found.   Assessment/Plan: Diagnosis: Impaired mobility and ADLs secondary to acute metabolic encephalopathy.  1. Does the need for close, 24 hr/day medical supervision in concert with the patient's rehab needs make it unreasonable for this patient to be served in a less intensive setting? Yes 2. Co-Morbidities requiring supervision/potential complications: HTN, OSA, paraplegia with spastic hemiplegia 3. Due to bladder management, bowel management, safety, skin/wound care, disease management, medication administration, pain management and patient education, does the patient require  24 hr/day rehab nursing? Yes 4. Does the patient require coordinated care of a physician, rehab nurse, therapy disciplines of OT,PT to address physical and functional deficits in the context of the above medical diagnosis(es)? Yes Addressing deficits in the following areas: balance, endurance, locomotion, strength, transferring, bowel/bladder control, bathing, dressing, feeding, grooming, toileting, cognition, speech, language, swallowing and psychosocial support 5. Can the patient actively participate in an intensive therapy program of at least 3 hrs of therapy per day at least 5 days per week? Yes 6. The potential for patient to make measurable gains while on inpatient rehab is excellent 7. Anticipated functional outcomes upon discharge from inpatient rehab are min assist  with PT, min assist with OT, independent with SLP. 8. Estimated rehab length of stay to reach the above functional goals is: 2 weeks 9. Anticipated discharge destination: Home 10. Overall Rehab/Functional Prognosis: excellent  RECOMMENDATIONS: This patient's condition is appropriate for continued rehabilitative care in the following setting: CIR  Patient has agreed to participate in recommended program. Yes Note that insurance prior authorization may be required for reimbursement for recommended care.  Comment: Mr. Chapnick would be an excellent inpatient rehabilitation candidate. He is a patient of Dr. Naaman Plummer and would benefit from rehabilitation to improve his safety and independence prior to returning home, as well as for improved spasticity control, mobility, and prevention of pressure ulcers. His goal would be to return to his baseline function of MinA from his wife for sliding board transfers and transfers into his vehicle, as well as improved spasticity control.   Bary Leriche, PA-C 09/22/2019   I have personally performed a face to face diagnostic evaluation, including, but not limited to relevant history and physical exam findings, of this patient and developed relevant assessment and plan.  Additionally, I have reviewed and concur with the physician assistant's documentation above.  Leeroy Cha, MD

## 2019-09-22 NOTE — H&P (Signed)
Physical Medicine and Rehabilitation Admission H&P    Chief Complaint  Patient presents with  . Functional decline     HPI: David Kim is a 73 year old male with history of HTN, OSA- CPAP, s/p ependymoma with spastic paraplegia who was admitted on 09/15/19 with one week history of lethargy progressing to AKI and seizure. He was started on Keppra and MRI brain negative for acute changes.  He ws found to have Klebsiella UTI and treated with IV rocephin as well as IVF with improvement in mentation.  Dr. Lorraine Lax felt that MS changes likely due to baclofen toxicity in setting of AKI and dose adjusted. AKI has resolved but he started having agitation with bouts of lethargy felt to be due to baclofen withdrawal therefor dose titrate back upwards. Therapy evaluations completed and patient noted to be limited by significant spasticity affecting mobility and ADLs. CIR recommended due to functional decline.    Review of Systems  Constitutional: Negative for chills and fever.  HENT: Negative for hearing loss.   Eyes: Negative for blurred vision and double vision.  Respiratory: Negative for cough and shortness of breath.   Cardiovascular: Negative for chest pain and palpitations.  Gastrointestinal: Positive for constipation. Negative for heartburn and nausea.  Musculoskeletal: Negative for myalgias.  Skin: Negative for rash.  Neurological: Positive for sensory change and focal weakness. Negative for headaches.  Psychiatric/Behavioral: Positive for memory loss. Negative for depression. The patient is not nervous/anxious.     Past Medical History:  Diagnosis Date  . Allergic rhinitis   . ALLERGIC RHINITIS 10/01/2007   Qualifier: Diagnosis of  By: Jenny Reichmann MD, Hunt Oris   . Anxiety   . Carpal tunnel syndrome    right  . Depression   . Depression   . Erectile dysfunction   . Fatigue   . GERD (gastroesophageal reflux disease)   . Gout   . Hypertension   . HYPOGONADISM 03/31/2008   Qualifier:  Diagnosis of  By: Jenny Reichmann MD, Hunt Oris   . IBS (irritable bowel syndrome)   . Low back pain   . OSA (obstructive sleep apnea)   . Paraplegia ([place]) 04/23/2009   Qualifier: Diagnosis of  By: Jenny Reichmann MD, Hunt Oris   . Peptic ulcer disease   . PEPTIC ULCER DISEASE 10/01/2007   Qualifier: Diagnosis of  By: Jenny Reichmann MD, Hunt Oris   . Prostate cancer [facility])   . PROSTATE CANCER, HX OF 05/23/2007   Qualifier: Diagnosis of  By: Jenny Reichmann MD, [place] PARALYSIS 04/23/2009   Qualifier: Diagnosis of  By: Jenny Reichmann MD, Hunt Oris   . Thoracic spinal cord injury ([place]) 03/12/2012    Past Surgical History:  Procedure Laterality Date  . ARTERY AND TENDON REPAIR Left 09/06/2018   Procedure: ARTERY AND TENDON REPAIR;  Surgeon: Charlotte Crumb, MD;  Location: [place];  Service: Orthopedics;  Laterality: Left;  . CAST APPLICATION Left [id number]   Procedure: CAST APPLICATION;  Surgeon: Charlotte Crumb, MD;  Location: [place];  Service: Orthopedics;  Laterality: Left;  . inguinal heniorrhaphy    . left leg surgery after fibula    . NERVE REPAIR Left 09/06/2018   Procedure: NERVE REPAIR TIMES TWO;  Surgeon: Charlotte Crumb, MD;  Location: [place];  Service: Orthopedics;  Laterality: Left;  . OPEN REDUCTION INTERNAL FIXATION (ORIF) DISTAL PHALANX Left 09/06/2018   Procedure: OPEN REDUCTION INTERNAL FIXATION (ORIF) LEFT LONG FINGER;  Surgeon: Charlotte Crumb, MD;  Location: [place];  Service:  Orthopedics;  Laterality: Left;  . PILONIDAL CYST / SINUS EXCISION    . PROSTATECTOMY    . tonsillectomy    . WOUND EXPLORATION Left 09/06/2018   Procedure: EXPLORATION OFCOMPLEX INJURY;  Surgeon: Charlotte Crumb, MD;  Location: [place];  Service: Orthopedics;  Laterality: Left;    Family History  Problem Relation Age of Onset  . High blood pressure Mother   . Dementia Mother   . Diabetes Father     Social History:  Married. Disabled Dealer. Wife assisted with transfers and ADLs. He was able to self cath and wife manages his bowel  program. He  reports that he has never smoked. He has never used smokeless tobacco. He reports that he does not drink alcohol or use drugs.    Allergies  Allergen Reactions  . Amoxicillin Other (See Comments)  . Endal Hd Other (See Comments)   Medications Prior to Admission  Medication Sig Dispense Refill  . antiseptic oral rinse (BIOTENE) LIQD 15 mLs by Mouth Rinse route as needed for dry mouth.    Marland Kitchen aspirin (ECOTRIN LOW STRENGTH) 81 MG EC tablet Take 81 mg by mouth daily.      . baclofen (LIORESAL) 10 MG tablet Take 1 tablet (10 mg total) by mouth 4 (four) times daily. Taking additional 10mg  tablet (1/2 tab in 0500 and 1700). 360 each 4  . baclofen (LIORESAL) 20 MG tablet Take 1 tablet (20 mg total) by mouth 4 (four) times daily. 360 tablet 4  . CALCIUM-MAGNESIUM PO Take 1 tablet by mouth daily.     . cholecalciferol (VITAMIN D3) 25 MCG (1000 UT) tablet Take 1,000 Units by mouth daily.    . clonazePAM (KLONOPIN) 2 MG tablet Take 2 tablets (4 mg total) by mouth at bedtime. 180 tablet 3  . losartan (COZAAR) 50 MG tablet Take 1 tablet (50 mg total) by mouth 2 (two) times daily. 180 tablet 3  . Multiple Vitamin (MULTIVITAMIN) capsule Take 1 capsule by mouth daily.    Marland Kitchen OVER THE COUNTER MEDICATION Take 22.5 mLs by mouth daily. Takes 1.5 tbsp  Mesotrace MV powder    . oxybutynin (DITROPAN) 5 MG tablet Take 5 mg by mouth 4 (four) times daily.     Marland Kitchen tiZANidine (ZANAFLEX) 4 MG tablet take 1-2 tablets by mouth four times a day 620 tablet 3  . vitamin C (ASCORBIC ACID) 500 MG tablet Take 500 mg by mouth daily.    . vitamin E (VITAMIN E) 400 UNIT capsule Take 800 Units by mouth daily.    . Zinc Oxide (SECURA EXTRA PROTECTIVE EX) Apply 1 application topically daily. To groin area      Drug Regimen Review  Drug regimen was reviewed and remains appropriate with no significant issues identified  Home: Home Living Family/patient expects to be discharged to:: Private residence Living Arrangements:  Spouse/significant other Available Help at Discharge: Family, Available 24 hours/day Type of Home: House Home Access: [place]: [place]: Wheelchair - power, Other (comment)(sliding board) Additional Comments: pt sponge bath only.   Lives With: Spouse   Functional History: Prior Function Level of Independence: Needs assistance Gait / Transfers Assistance Needed: pt's wife reports he required mod assist for bed mobility for BLE, some assistance for slide board transfers to his w/c, mod I in w/c, wife could leave pt alone if necessary ADL's / Homemaking Assistance Needed: pt can feed himself and complete UB bathing supine. pt require wife (A) to swing to EOB and (  A) to sliding board to wheelchair. pt was able to sustain supervision EOB for wife ot position chair  Functional Status:  Mobility: Bed Mobility Overal bed mobility: Needs Assistance Bed Mobility: Supine to Sit Supine to sit: Mod assist Sit to supine: Max assist, HOB elevated General bed mobility comments: requires mod assist to bring LEs off side of bed, patient is able to use bed rail to sit trunk up Transfers Overall transfer level: Needs assistance Equipment used: Sliding board Transfers: Lateral/Scoot Transfers  Lateral/Scoot Transfers: Min assist General transfer comment: min assist to position sliding board, cues for scooting and positioning. Ambulation/Gait General Gait Details: non-ambulatory    ADL: ADL Overall ADL's : Needs assistance/impaired Eating/Feeding: Minimal assistance, Bed level Grooming: Moderate assistance, Bed level Upper Body Bathing: Maximal assistance, Bed level Lower Body Bathing: Total assistance General ADL Comments: pt transfered to EOB with max (A) and then progressive lean to the L side with increase (A) from therapist. wife reports normally supervision level   Cognition: Cognition Overall Cognitive Status: Within Functional Limits for tasks  assessed Arousal/Alertness: Awake/alert Orientation Level: Oriented to person, Oriented to place, Disoriented to situation, Disoriented to time Attention: Focused, Sustained Focused Attention: Appears intact Sustained Attention: Impaired Sustained Attention Impairment: Verbal basic Memory: Impaired Memory Impairment: Storage deficit, Retrieval deficit, Decreased recall of new information, Decreased short term memory, Prospective memory Awareness: Impaired Awareness Impairment: Intellectual impairment, Emergent impairment, Anticipatory impairment Problem Solving: Impaired Problem Solving Impairment: Verbal basic Executive Function: Reasoning, Organizing Reasoning: Impaired Reasoning Impairment: Verbal basic Organizing: Impaired Organizing Impairment: Verbal basic Safety/Judgment: Impaired Cognition Arousal/Alertness: Awake/alert Behavior During Therapy: WFL for tasks assessed/performed, Flat affect Overall Cognitive Status: Within Functional Limits for tasks assessed Area of Impairment: Attention, Awareness, Following commands Orientation Level: Time, Situation Current Attention Level: Focused Memory: Decreased short-term memory Following Commands: Follows one step commands with increased time Safety/Judgement: Decreased awareness of safety, Decreased awareness of deficits Awareness: Intellectual Problem Solving: Slow processing, Decreased initiation, Requires verbal cues General Comments: patient more alert and oriented this visit, wife present   Blood pressure 124/67, pulse 85, temperature 97.6 F (36.4 C), temperature source Oral, resp. rate 18, height 5\' 8"  (1.727 m), weight 88 kg, SpO2 99 %. Physical Exam  Nursing note and vitals reviewed. Constitutional: He appears well-developed and well-nourished. No distress.  HENT:  Head: Normocephalic.  Eyes: Pupils are equal, round, and reactive to light. Conjunctivae and EOM are normal. Right eye exhibits no discharge. Left eye  exhibits no discharge.  Visual acuity slightly impaired. Can't appreciate cataracts on exam  Neck: Normal range of motion. No tracheal deviation present. No thyromegaly present.  Cardiovascular: Normal rate and regular rhythm. Exam reveals no friction rub.  No murmur heard. Respiratory: Effort normal and breath sounds normal. No respiratory distress. He has no wheezes. He has no rales.  GI: He exhibits no distension. There is no abdominal tenderness.  Genitourinary:    Genitourinary Comments: Foley catheter in place   Musculoskeletal:        General: No edema.  Neurological:  Sl disoriented but not far from baseline. Oriented to person, place, reason he's here. Remembered me quickly when I saw him.  decr visual acuity. UE 5/5. LE: 0/5 bilateral LE's. DTR's 3+ bilateral LE with 2-3 beats clonus. Ongoing hip flexor tightness LLE. Minimal sensation to LT, Pain in LE's.   Skin: Skin is warm. He is not diaphoretic. No erythema.  Psychiatric:  Pt flat but cooperative. Not far from baseline.     No results  found for this or any previous visit (from the past 48 hour(s)). No results found.     Medical Problem List and Plan: 1.  Functional deficits secondary to encephalopathy d/t urosepsis, AKI in setting of T-10 myelopathy  -admit to inpatient rehab 2.  Antithrombotics: -DVT/anticoagulation:  Pharmaceutical: Lovenox  -antiplatelet therapy: N/A 3. Pain Management:  Tylenol prn 4. Mood: LCSW to follow for evaluation and support.   -antipsychotic agents: N/a 5. Neuropsych: This patient is not fully capable of making decisions on his own behalf.  -He's not too far from baseline at this point 6. Skin/Wound Care: Routine pressure relief measures. 7. Fluids/Electrolytes/Nutrition:  Monitor I/O. Check lytes in am.  8. Acute renal failure: has resolved 26/1.43-->15/0.91.  9. Klebsiella UTI: On Ceftriaxone D#6/7 10. Seizures: Felt to be secondary to baclofen toxicity--continue Keppra  500 mg bid   11. Anxiety disorder: On ativan 1 mg IV every 6 hours currently--> d/c and transition to Klonopin 1 mg bid.  10. Paraplegia with spastic hemiplegia: On Tizanidine 4 mg qid, baclofen 20 mg ac/hs  -received botox to left hip flexors, hip adductors for persistent tone. Was experiencing improvement until developing the UTI, baclofen reduction   -monitor for now 11. HTN: Monitor BP bid--off cozaar at this time.  12. Neurogenic bowel and bladder: remove foley and begin voiding trial. He was voiding continently at home per wife. I question whether he really needed to be cathed for larger PVR's given UTI  -remove foley  -will scan for PVR's and cath as needed   -daily dig stim for bowel program as per home. 13. Severe OSA: Continue CPAP when napping/sleeping.      Bary Leriche, PA-C 09/22/2019

## 2019-09-22 NOTE — Progress Notes (Signed)
Physical Therapy Treatment Patient Details Name: David Kim MRN: [id number] DOB: [birth year] Today's Date: 09/22/2019    History of Present Illness 73 y/o M admitted on 09/15/2019 for confusion & wife believed pt to be having seizure. Pt found to have acute metabolic encephalopathy and klebsiella pneumonia UTI.  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    PT Comments    Patient received in bed, wife present. Patient alert, perseverating on catheter being removed. Patient requires mod assist for bed mobility to bring LEs off bed. Able to raise trunk with use of bed rail. Patient is able to use sliding board to go from bed to recliner with mod assist for positioning of sliding board and requires cues for positioning. Patient will benefit from continued skilled PT while here to improve strength and independence with transfers for safe return home.      Follow Up Recommendations  CIR;Home health PT     Equipment Recommendations  None recommended by PT    Recommendations for Other Services       Precautions / Restrictions Precautions Precautions: Fall Precaution Comments: hx of paraplegia (from umbilicus down) for past ~9 years Restrictions Weight Bearing Restrictions: No    Mobility  Bed Mobility Overal bed mobility: Needs Assistance Bed Mobility: Supine to Sit     Supine to sit: Mod assist     General bed mobility comments: requires mod assist to bring LEs off side of bed, patient is able to use bed rail to sit trunk up  Transfers Overall transfer level: Needs assistance Equipment used: Sliding board Transfers: Lateral/Scoot Transfers          Lateral/Scoot Transfers: Min assist General transfer comment: min assist to position sliding board, cues for scooting and positioning.  Ambulation/Gait             General Gait Details: non-ambulatory   Stairs              Wheelchair Mobility    Modified Rankin (Stroke Patients Only)       Balance Overall balance assessment: Needs assistance Sitting-balance support: Bilateral upper extremity supported;Feet supported Sitting balance-Leahy Scale: Fair                                      Cognition Arousal/Alertness: Awake/alert Behavior During Therapy: WFL for tasks assessed/performed;Flat affect Overall Cognitive Status: Impaired/Different from baseline Area of Impairment: Attention;Awareness;Following commands                 Orientation Level: Time;Situation Current Attention Level: Focused Memory: Decreased short-term memory Following Commands: Follows one step commands with increased time     Problem Solving: Slow processing;Decreased initiation;Requires verbal cues General Comments: patient more alert and oriented this visit, wife present      Exercises      General Comments        Pertinent Vitals/Pain Pain Assessment: No/denies pain    Home Living                      Prior Function            PT Goals (current goals can now be found in the care plan section) Acute Rehab PT Goals Patient Stated Goal: debating whether to go to CIR prior to taking him home. PT Goal Formulation: With  patient/family Time For Goal Achievement: 10/03/19 Potential to Achieve Goals: Good Progress towards PT goals: Progressing toward goals    Frequency    Min 3X/week      PT Plan Current plan remains appropriate    Co-evaluation              AM-PAC PT "6 Clicks" Mobility   Outcome Measure  Help needed turning from your back to your side while in a flat bed without using bedrails?: A Little Help needed moving from lying on your back to sitting on the side of a flat bed without using bedrails?: A Little Help needed moving to and from a bed to a chair (including a wheelchair)?: A Little Help needed standing up from a chair using your  arms (e.g., wheelchair or bedside chair)?: Total Help needed to walk in hospital room?: Total Help needed climbing 3-5 steps with a railing? : Total 6 Click Score: 12    End of Session   Activity Tolerance: Patient tolerated treatment well Patient left: with call bell/phone within reach;with family/visitor present;in chair Nurse Communication: Mobility status PT Visit Diagnosis: Muscle weakness (generalized) (M62.81)     Time: [id number] PT Time Calculation (min) (ACUTE ONLY): 31 min  Charges:  $Therapeutic Activity: 23-37 mins                     Guneet Delpino, PT, GCS 09/22/19,12:06 PM

## 2019-09-22 NOTE — Plan of Care (Signed)
Atient progressing toward plan of care goals.

## 2019-09-22 NOTE — Progress Notes (Signed)
Meredith Staggers, MD  Physician  Physical Medicine and Rehabilitation  PMR Pre-admission  Signed  Date of Service:  09/22/2019 1:52 PM      Related encounter: ED to Hosp-Admission (Discharged) from 09/15/2019 in [facility]      Signed         PMR Admission Coordinator Pre-Admission Assessment  Patient: David Kim is an 73 y.o., male MRN: [id number] DOB: [birth year] Height: '5\' 8"'  (172.7 cm) Weight: 88 kg                                                                                                                                                  Insurance Information HMO:     PPO:      PCP:      IPA:      80/20: yes     OTHER:  PRIMARY: Medicare Part A and B      Policy#: [id number]      Subscriber: Patient CM Name:       Phone#:      Fax#:  Pre-Cert#:       Employer:  Benefits:  Phone #: online     Name: verified eligibility via [place] on 09/22/2019 Eff. Date: Part A and B 01/04/2013     Deduct: $1,408      Out of Pocket Max: NA      Life Max: NA CIR: Covered per Medicare Guidelines once yearly deductible has been met      SNF: days 1-20, 100%, days 21-100, 80% Outpatient: 80%     Co-Pay: 20% Home Health: 100%      Co-Pay:  DME: 80%     Co-Pay: 20% Providers: Pt's choice  SECONDARY: BCBS      Policy#: [id number]      Subscriber: Patient CM Name:       Phone#:      Fax#:  Pre-Cert#:       Employer:  Benefits:  Phone #: [phone]     Name:  Eff. Date:      Deduct:       Out of Pocket Max:       Life Max: CIR:       SNF:  Outpatient:     Co-Pay:  Home Health:      Co-Pay:  DME:      Co-Pay:   Medicaid Application Date:       Case Manager:  Disability Application Date:       Case Worker:   The "Data Collection Information Summary" for patients in Inpatient Rehabilitation Facilities with attached "[place] Records" was provided and verbally reviewed with: Patient and Family  Emergency Contact Information          Contact Information    Name Relation Home Work Hasty W Spouse [phone]  [phone]     Current Medical History  Patient Admitting Diagnosis: Functional decline due to urosepsis  History of Present Illness: David Kim a 73 y.o.malewith history of HTN, OSA, paraplegia with spastic hemiplegia due to spinal tumor and was admitted on 09/15/19 with reports of one week history of mild confusion progressing to lethargy for 48 hours followed by seizure leading to admission. He was found to have acute renal failure with Klebsiella pneumonia UTI in setting of high doses of baclofen. He was started on IVF for hydration as well as IV antibiotics. CT head/MRI brain negative for acute changes and Dr. Lorraine Lax felt that MS changes due to baclofen toxicity and dose decrease. EEG negative for Mental status has continued to wax and wane with significant bouts of agitation question due to baclofen withdrawal therefore dose increased to 20 mg qid.Pt has been evaluated by therapies with recommendation for CIR. Pt is to be admitted to CIR on 09/22/2019.  Glasgow Coma Scale Score: 15  Past Medical History      Past Medical History:  Diagnosis Date  . Allergic rhinitis   . ALLERGIC RHINITIS 10/01/2007   Qualifier: Diagnosis of  By: Jenny Reichmann MD, Hunt Oris   . Anxiety   . Carpal tunnel syndrome    right  . Depression   . Depression   . Erectile dysfunction   . Fatigue   . GERD (gastroesophageal reflux disease)   . Gout   . Hypertension   . HYPOGONADISM 03/31/2008   Qualifier: Diagnosis of  By: Jenny Reichmann MD, Hunt Oris   . IBS (irritable bowel syndrome)   . Low back pain   . OSA (obstructive sleep apnea)   . Paraplegia ([place]) 04/23/2009   Qualifier: Diagnosis of  By: Jenny Reichmann MD, Hunt Oris   . Peptic ulcer disease   . PEPTIC ULCER DISEASE 10/01/2007   Qualifier: Diagnosis of  By: Jenny Reichmann MD, Hunt Oris   . Prostate cancer [facility])   . PROSTATE CANCER, HX OF 05/23/2007    Qualifier: Diagnosis of  By: Jenny Reichmann MD, [place] [facility] 04/23/2009   Qualifier: Diagnosis of  By: Jenny Reichmann MD, Hunt Oris   . Thoracic spinal cord injury ([place]) 03/12/2012    Family History  family history includes Dementia in his mother; Diabetes in his father; High blood pressure in his mother.  Prior Rehab/Hospitalizations:  Has the patient had prior rehab or hospitalizations prior to admission? Yes  Has the patient had major surgery during 100 days prior to admission? No  Current Medications   Current Facility-Administered Medications:  .  0.9 %  sodium chloride infusion, , Intravenous, PRN, Mariel Aloe, MD, Stopped at 09/20/19 0700 .  acetaminophen (TYLENOL) tablet 650 mg, 650 mg, Oral, Q6H PRN **OR** acetaminophen (TYLENOL) suppository 650 mg, 650 mg, Rectal, Q6H PRN, Etta Quill, DO .  aspirin EC tablet 81 mg, 81 mg, Oral, Daily, Etta Quill, DO, 81 mg at 09/22/19 0845 .  baclofen (LIORESAL) tablet 20 mg, 20 mg, Oral, TID AC & HS, Eder, Macek, PA-C, 20 mg at 09/22/19 1259 .  cefTRIAXone (ROCEPHIN) 1 g in sodium chloride 0.9 % 100 mL IVPB, 1 g, Intravenous, Q24H, Gardner, Jared M, DO, Last Rate: 200 mL/hr at 09/22/19 0426, 1 g at 09/22/19 0426 .  Chlorhexidine Gluconate Cloth 2 % PADS 6 each, 6 each, Topical, Daily, Kc, Ramesh, MD, 6 each at 09/22/19 0846 .  enoxaparin (LOVENOX) injection 40 mg, 40 mg, Subcutaneous, Daily, Jennette Kettle  M, DO, 40 mg at 09/22/19 0844 .  influenza vaccine adjuvanted (FLUAD) injection 0.5 mL, 0.5 mL, Intramuscular, Tomorrow-1000, Kc, Ramesh, MD .  levETIRAcetam (KEPPRA) IVPB 500 mg/100 mL premix, 500 mg, Intravenous, Q12H, Etta Quill, DO, Last Rate: 400 mL/hr at 09/22/19 0843, 500 mg at 09/22/19 0843 .  LORazepam (ATIVAN) injection 1 mg, 1 mg, Intravenous, Q6H, Gardner, Jared M, DO, 1 mg at 09/22/19 1300 .  ondansetron (ZOFRAN) tablet 4 mg, 4 mg, Oral, Q6H PRN **OR** ondansetron (ZOFRAN) injection 4 mg, 4 mg, Intravenous,  Q6H PRN, Etta Quill, DO .  oxybutynin (DITROPAN) tablet 5 mg, 5 mg, Oral, QID, Alcario Drought, Jared M, DO, 5 mg at 09/22/19 1303 .  tiZANidine (ZANAFLEX) tablet 4 mg, 4 mg, Oral, Q6H, Gardner, Jared M, DO, 4 mg at 09/22/19 1259 .  vitamin C (ASCORBIC ACID) tablet 500 mg, 500 mg, Oral, Daily, Jennette Kettle M, DO, 500 mg at 09/22/19 0843 .  vitamin E capsule 800 Units, 800 Units, Oral, Daily, Etta Quill, DO, 800 Units at 09/22/19 5366  Patients Current Diet:     Diet Order                  DIET DYS 3 Room service appropriate? Yes with Assist; Fluid consistency: Thin  Diet effective now               Precautions / Restrictions Precautions Precautions: Fall Precaution Comments: hx of paraplegia (from umbilicus down) for past ~9 years Restrictions Weight Bearing Restrictions: No   Has the patient had 2 or more falls or a fall with injury in the past year?No  Prior Activity Level Limited Community (1-2x/wk): limited to MD appointments; wife assists as needed  Prior Functional Level Prior Function Level of Independence: Needs assistance Gait / Transfers Assistance Needed: pt's wife reports he required mod assist for bed mobility for BLE, some assistance for slide board transfers to his w/c, mod I in w/c, wife could leave pt alone if necessary ADL's / Homemaking Assistance Needed: pt can feed himself and complete UB bathing supine. pt require wife (A) to swing to EOB and (A) to sliding board to wheelchair. pt was able to sustain supervision EOB for wife ot position chair  Self Care: Did the patient need help bathing, dressing, using the toilet or eating?  Needed some help (set up/Supervision)  Indoor Mobility: Did the patient need assistance with walking from room to room (with or without device)? Independent via wc  Stairs: Did the patient need assistance with internal or external stairs (with or without device)? Dependent  Functional Cognition: Did the  patient need help planning regular tasks such as shopping or remembering to take medications? Independent  Home Equities trader / Equipment Home Assistive Devices/Equipment: Wheelchair Home Equipment: Wheelchair - power, Other (comment)(sliding board)  Prior Device Use: Indicate devices/aids used by the patient prior to current illness, exacerbation or injury? Manual wheelchair, slideboards  Current Functional Level Cognition  Arousal/Alertness: Awake/alert Overall Cognitive Status: Within Functional Limits for tasks assessed Current Attention Level: Focused Orientation Level: Oriented to person, Oriented to place, Disoriented to situation, Disoriented to time Following Commands: Follows one step commands with increased time Safety/Judgement: Decreased awareness of safety, Decreased awareness of deficits General Comments: patient more alert and oriented this visit, wife present Attention: Focused, Sustained Focused Attention: Appears intact Sustained Attention: Impaired Sustained Attention Impairment: Verbal basic Memory: Impaired Memory Impairment: Storage deficit, Retrieval deficit, Decreased recall of new information, Decreased short term memory, Prospective  memory Awareness: Impaired Awareness Impairment: Intellectual impairment, Emergent impairment, Anticipatory impairment Problem Solving: Impaired Problem Solving Impairment: Verbal basic Executive Function: Reasoning, Organizing Reasoning: Impaired Reasoning Impairment: Verbal basic Organizing: Impaired Organizing Impairment: Verbal basic Safety/Judgment: Impaired    Extremity Assessment (includes Sensation/Coordination)  Upper Extremity Assessment: Overall WFL for tasks assessed  Lower Extremity Assessment: Defer to PT evaluation    ADLs  Overall ADL's : Needs assistance/impaired Eating/Feeding: Minimal assistance, Bed level Grooming: Moderate assistance, Bed level Upper Body Bathing: Maximal assistance, Bed  level Lower Body Bathing: Total assistance General ADL Comments: pt transfered to EOB with max (A) and then progressive lean to the L side with increase (A) from therapist. wife reports normally supervision level     Mobility  Overal bed mobility: Needs Assistance Bed Mobility: Supine to Sit Supine to sit: Mod assist Sit to supine: Max assist, HOB elevated General bed mobility comments: requires mod assist to bring LEs off side of bed, patient is able to use bed rail to sit trunk up    Transfers  Overall transfer level: Needs assistance Equipment used: Sliding board Transfers: Lateral/Scoot Transfers  Lateral/Scoot Transfers: Min assist General transfer comment: min assist to position sliding board, cues for scooting and positioning.    Ambulation / Gait / Stairs / Wheelchair Mobility  Ambulation/Gait General Gait Details: non-ambulatory    Posture / Balance Dynamic Sitting Balance Sitting balance - Comments: mod (A)  Balance Overall balance assessment: Needs assistance Sitting-balance support: Bilateral upper extremity supported, Feet supported Sitting balance-Leahy Scale: Fair Sitting balance - Comments: mod (A)  Postural control: Left lateral lean(pt's wife reports this is normal for pt to support himself with LUE)    Special needs/care consideration BiPAP/CPAP: yes CPAP at home  CPM: no Continuous Drip IV: no Dialysis: no        Days: no Life Vest; no Oxygen: RA Special Bed: seizure precautions Trach Size: no Wound Vac (area): no      Location :no Skin: abrasion to upper and lower lip                        Bowel mgmt: last BM: 09/17/2019, per chart wife does dig stim Bladder mgmt: urinary catheter Diabetic mgmt: no Behavioral consideration : no Chemo/radiation : no     Previous Home Environment (from acute therapy documentation) Living Arrangements: Spouse/significant other  Lives With: Spouse Available Help at Discharge: Family, Available 24  hours/day Type of Home: House Home Layout: One level Home Access: Ramped entrance [place]: No Additional Comments: pt sponge bath only.   Discharge Living Setting Plans for Discharge Living Setting: Patient's home, Lives with (comment)(wife) Type of Home at Discharge: House Discharge Home Layout: One level Discharge Home Access: [place] entrance Discharge Bathroom Shower/Tub: Other (comment)(pt bathes and dresses bedlevel) Discharge Bathroom Toilet: Handicapped height(via drop arm BSC (usually toilets in bed)) Discharge Bathroom Accessibility: Yes How Accessible: Accessible via wheelchair Does the patient have any problems obtaining your medications?: No  Social/Family/Support Systems Patient Roles: Spouse Contact Information: wife: [name] [phone] (home: [phone]) Anticipated Caregiver: wife Anticipated Caregiver's Contact Information: see above Ability/Limitations of Caregiver: Min  Caregiver Availability: 24/7 Discharge Plan Discussed with Primary Caregiver: Yes Is Caregiver In Agreement with Plan?: Yes Does Caregiver/Family have Issues with Lodging/Transportation while Pt is in Rehab?: No   Goals/Additional Needs Patient/Family Goal for Rehab: PT/OT: Supervision; SLP: NA Expected length of stay: 5-7 days Cultural Considerations: NA Dietary Needs: DYS 3 diet, thin liquids; room service  with assist.  Equipment Needs: TBD Special Service Needs: paraplegia at baseline  Pt/Family Agrees to Admission and willing to participate: Yes Program Orientation Provided & Reviewed with Pt/Caregiver Including Roles  & Responsibilities: Yes(pt and wife)  Barriers to Discharge: Neurogenic Bowel & Bladder   Decrease burden of Care through IP rehab admission: NA   Possible need for SNF placement upon discharge:Not anticipated; pt's wife is capable of providing physical assist at DC and pt and family understand the plan is to return home once able.    Patient  Condition: This patient's condition remains as documented in the consult dated 09/22/2019, in which the Rehabilitation Physician determined and documented that the patient's condition is appropriate for intensive rehabilitative care in an inpatient rehabilitation facility. Will admit to inpatient rehab today.  Preadmission Screen Completed By:  Raechel Ache, OT, 09/22/2019 2:17 PM ______________________________________________________________________   Discussed status with Dr. Naaman Plummer on 09/22/2019 at 2:07PM and received approval for admission today.  Admission Coordinator:  Raechel Ache, time 2:07PM/Date11/16/2020         Revision History Date/Time User Provider Type Action  09/22/2019 2:20 PM Meredith Staggers, MD Physician Sign  09/22/2019 2:17 PM Raechel Ache, [facility] Admission Coordinator Share  View Details Report

## 2019-09-22 NOTE — Discharge Summary (Signed)
Physician Discharge Summary  David Kim [id number] DOB: 1946-07-07 DOA: 09/15/2019  PCP: Biagio Borg, MD  Admit date: 09/15/2019 Discharge date: 09/22/2019  Admitted From: Home Disposition: Inpatient rehab  Recommendations for Outpatient Follow-up:  1. Follow up with PCP in 1 week 2. Follow up with neurology 3. Please obtain BMP/CBC in one week 4. Please follow up on the following pending results: None  Discharge Condition: Stable CODE STATUS: Full code Diet recommendation: Regular diet   Brief/Interim Summary:  Admission HPI written by Etta Quill, DO   HPI: David Kim is a 73 y.o. 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  At baseline patient has paraplegia from the umbilicus down, has neurogenic bladder and bowel, though he apparently is able to bladder empty without catheter use and just wears diapers, with "occasional UTI" (per Dr. Rhea Belton note from 4 days ago).  He has been struggling with BLE spasticity, is on 35mg  QID of baclofen (looks like this reduced to 30mg  QID x4 days ago by Dr. Krista Blue), also on tizanidine, klonopin, and undergoes botox injections with Dr. Naaman Plummer.  This morning patient's wife noticed he was confused and had a bite wound to lower lip.  He remained confused throughout the day today.  This evening he had seizure activity at home and so wife brought him in to ED.  No decubitus ulcer per wife.  Wears CPAP at night while sleeping.   Hospital course:  Acute toxic encephalopathy In setting of Baclofen toxicity. Initially improved with adjustment of dose but now appears to be significantly agitated concerning for withdrawal. Renal function significantly improved which may have precipitated withdrawal. Neurology consulted and Baclofen dose adjusted to a final dose of Baclofen 20 mg QID. Patient recommended to follow-up with neurologist as an outpatient.   Seizures In setting of Baclofen toxicity. Started on Keppra but discontinued prior to discharge. EEG did not reveal seizures. Neurology evaluated patient.  Paraplegia Neurogenic bladder Continue Baclofen (dose adjusted as mentioned above), tizanidine, oxybutynin. Recommended for inpatient rehab on discharge.  Anxiety Resume home Klonopin  OSA CPAP  UTI Klebsiella pneumoniae. Treated with Ceftriaxone. Completed course.  AKI Baseline creatinine of less than 1. Creatinine of 1.43 on admission. Resolved.   Discharge Diagnoses:  Principal Problem:   Acute metabolic encephalopathy Active Problems:   Paraplegia ([place])   Spinal cord injury, thoracic region [facility])   New onset seizure ([place])   Post-ictal state ([place])   Acute encephalopathy    Discharge Instructions   Allergies as of 09/22/2019      Reactions   Amoxicillin Other (See Comments)   Endal Hd Other (See Comments)      Medication List    TAKE these medications   antiseptic oral rinse Liqd 15 mLs by Mouth Rinse route as needed for dry mouth.   baclofen 20 MG tablet Commonly known as: LIORESAL Take 1 tablet (20 mg total) by mouth 4 (four) times daily. What changed: Another medication with the same name was removed. Continue taking this medication, and follow the directions you see here.   CALCIUM-MAGNESIUM PO Take 1 tablet by mouth daily.   cholecalciferol 25 MCG (1000 UT) tablet Commonly known as: VITAMIN D3 Take 1,000 Units by mouth daily.   clonazePAM 2 MG tablet Commonly known as: KLONOPIN Take 2 tablets (4 mg total) by mouth at bedtime.   Ecotrin Low Strength 81 MG EC tablet Generic drug: aspirin Take 81  mg by mouth daily.   losartan 50 MG tablet Commonly known as: COZAAR Take 1 tablet (50 mg total) by mouth 2 (two) times daily.   multivitamin capsule Take 1 capsule by mouth daily.   OVER THE COUNTER MEDICATION Take 22.5 mLs by mouth daily. Takes 1.5 tbsp  Mesotrace MV powder    oxybutynin 5 MG tablet Commonly known as: DITROPAN Take 5 mg by mouth 4 (four) times daily.   SECURA EXTRA PROTECTIVE EX Apply 1 application topically daily. To groin area   tiZANidine 4 MG tablet Commonly known as: ZANAFLEX take 1-2 tablets by mouth four times a day   vitamin C 500 MG tablet Commonly known as: ASCORBIC ACID Take 500 mg by mouth daily.   vitamin E 400 UNIT capsule Generic drug: vitamin E Take 800 Units by mouth daily.      Follow-up Information    Biagio Borg, MD. Schedule an appointment as soon as possible for a visit in 1 week(s).   Specialties: Internal Medicine, Radiology Contact information: [facility] [place] [address] [phone]        Marcial Pacas, MD Follow up.   Specialty: Neurology Why: Paraplegia, Baclofen Contact information: [place] [address] [phone]          Allergies  Allergen Reactions  . Amoxicillin Other (See Comments)  . Endal Hd Other (See Comments)    Consultations:  Neurology   Procedures/Studies: Ct Head Wo Contrast  Result Date: 09/15/2019 CLINICAL DATA:  New onset seizure EXAM: CT HEAD WITHOUT CONTRAST TECHNIQUE: Contiguous axial images were obtained from the base of the skull through the vertex without intravenous contrast. COMPARISON:  None. FINDINGS: The examination is severely motion degraded. There is no midline shift. There is no visible, large extra-axial collection. No depressed skull fracture. IMPRESSION: Severely motion degraded examination.  No visible acute abnormality Electronically Signed   By: Ulyses Jarred M.D.   On: 09/15/2019 22:15   Mr Brain Wo Contrast  Result Date: 09/16/2019 CLINICAL DATA:  Confusion and seizure activity. EXAM: MRI HEAD WITHOUT CONTRAST TECHNIQUE: Multiplanar, multiecho pulse sequences of the brain and surrounding structures were obtained without intravenous contrast. COMPARISON:  None. FINDINGS: BRAIN: There is no acute  infarct, acute hemorrhage or extra-axial collection. The white matter signal is normal for the patient's age. The cerebral and cerebellar volume are age-appropriate. There is no hydrocephalus. The midline structures are normal. VASCULAR: The major intracranial arterial and venous sinus flow voids are normal. Susceptibility-sensitive sequences show no chronic microhemorrhage or superficial siderosis. SKULL AND UPPER CERVICAL SPINE: Calvarial bone marrow signal is normal. There is no skull base mass. The visualized upper cervical spine and soft tissues are normal. SINUSES/ORBITS: There are no fluid levels or advanced mucosal thickening. The mastoid air cells and middle ear cavities are free of fluid. The orbits are normal. IMPRESSION: Normal aging brain. Electronically Signed   By: Ulyses Jarred M.D.   On: 09/16/2019 02:29   Mr Lumbar Spine W Wo Contrast  Result Date: 09/16/2019 CLINICAL DATA:  Confusion and seizure activity. History of lower thoracic ependymoma. EXAM: MRI LUMBAR SPINE WITHOUT AND WITH CONTRAST TECHNIQUE: Multiplanar and multiecho pulse sequences of the lumbar spine were obtained without and with intravenous contrast. CONTRAST:  8.16mL GADAVIST GADOBUTROL 1 MMOL/ML IV SOLN COMPARISON:  None. FINDINGS: Segmentation:  Normal Alignment:  Normal Vertebrae: No acute compression fracture, discitis-osteomyelitis, facet edema or other focal marrow lesion. No epidural collection. Conus medullaris and cauda  equina: Conus extends to the L1 level. Conus and cauda equina appear normal. Paraspinal and other soft tissues: Negative Disc levels: Sagittal imaging extends from T11 to the sacrum. There is no disc herniation. No spinal canal or neural foraminal stenosis. No abnormal contrast enhancement. IMPRESSION: 1. Normal MRI of the lumbar spine. No spinal canal or neural foraminal stenosis. 2. Surgical bed of prior ependymoma resection (T8-T10) is not included in the field of view. Electronically Signed   By:  Ulyses Jarred M.D.   On: 09/16/2019 03:06     Subjective: No concerns today.   Discharge Exam: Vitals:   09/22/19 0800 09/22/19 1311  BP: 111/68 124/67  Pulse: 84 85  Resp: 18 18  Temp: 98 F (36.7 C) 97.6 F (36.4 C)  SpO2: 98% 99%   Vitals:   09/21/19 2314 09/22/19 0413 09/22/19 0800 09/22/19 1311  BP: 134/76 107/61 111/68 124/67  Pulse: 74 69 84 85  Resp: 18 20 18 18   Temp: 97.9 F (36.6 C) 98 F (36.7 C) 98 F (36.7 C) 97.6 F (36.4 C)  TempSrc: Oral  Oral Oral  SpO2: 98% 98% 98% 99%  Weight:      Height:        General: Pt is alert, awake, not in acute distress Cardiovascular: RRR, S1/S2 +, no rubs, no gallops Respiratory: CTA bilaterally, no wheezing, no rhonchi Abdominal: Soft, NT, ND, bowel sounds + Extremities: no edema, no cyanosis    The results of significant diagnostics from this hospitalization (including imaging, microbiology, ancillary and laboratory) are listed below for reference.     Microbiology: Recent Results (from the past 240 hour(s))  SARS CORONAVIRUS 2 (TAT 6-24 HRS) Nasopharyngeal Nasopharyngeal Swab     Status: None   Collection Time: 09/15/19 10:54 PM   Specimen: Nasopharyngeal Swab  Result Value Ref Range Status   SARS Coronavirus 2 NEGATIVE NEGATIVE Final    Comment: (NOTE) SARS-CoV-2 target nucleic acids are NOT DETECTED. The SARS-CoV-2 RNA is generally detectable in upper and lower respiratory specimens during the acute phase of infection. Negative results do not preclude SARS-CoV-2 infection, do not rule out co-infections with other pathogens, and should not be used as the sole basis for treatment or other patient management decisions. Negative results must be combined with clinical observations, patient history, and epidemiological information. The expected result is Negative. Fact Sheet for Patients: SugarRoll.be Fact Sheet for Healthcare Providers:  https://www.woods-mathews.com/ This test is not yet approved or cleared by the [place] FDA and  has been authorized for detection and/or diagnosis of SARS-CoV-2 by FDA under an Emergency Use Authorization (EUA). This EUA will remain  in effect (meaning this test can be used) for the duration of the COVID-19 declaration under Section 56 4(b)(1) of the Act, 21 U.S.C. section 360bbb-3(b)(1), unless the authorization is terminated or revoked sooner. Performed at [facility], [place] [address] [place], [place] [address]   Culture, Urine     Status: Abnormal   Collection Time: 09/16/19  4:03 AM   Specimen: Urine, Random  Result Value Ref Range Status   Specimen Description URINE, RANDOM  Final   Special Requests   Final    NONE Performed at [facility], [place] [address], [place], [place] [address]    Culture >=100,000 COLONIES/mL KLEBSIELLA PNEUMONIAE (A)  Final   Report Status 09/18/2019 FINAL  Final   Organism ID, Bacteria KLEBSIELLA PNEUMONIAE (A)  Final      Susceptibility   Klebsiella pneumoniae -  MIC*    AMPICILLIN >=32 RESISTANT Resistant     CEFAZOLIN <=4 SENSITIVE Sensitive     CEFTRIAXONE <=1 SENSITIVE Sensitive     CIPROFLOXACIN <=0.25 SENSITIVE Sensitive     GENTAMICIN <=1 SENSITIVE Sensitive     IMIPENEM <=0.25 SENSITIVE Sensitive     NITROFURANTOIN 128 RESISTANT Resistant     TRIMETH/SULFA <=20 SENSITIVE Sensitive     AMPICILLIN/SULBACTAM 4 SENSITIVE Sensitive     PIP/TAZO <=4 SENSITIVE Sensitive     Extended ESBL NEGATIVE Sensitive     * >=100,000 COLONIES/mL KLEBSIELLA PNEUMONIAE     Labs: BNP (last 3 results) No results for input(s): BNP in the last 8760 hours. Basic Metabolic Panel: Recent Labs  Lab 09/15/19 2121 09/16/19 0356 09/17/19 0258  NA 138 137 137  K 3.3* 3.9 3.8  CL 104 107 104  CO2 23 23 22   GLUCOSE 158* 118* 92  BUN 26* 23 15  CREATININE 1.43* 1.05 0.91  CALCIUM 8.7* 8.2* 8.2*   Liver Function Tests: Recent  Labs  Lab 09/15/19 2121  AST 59*  ALT 24  ALKPHOS 70  BILITOT 1.1  PROT 6.1*  ALBUMIN 3.1*   No results for input(s): LIPASE, AMYLASE in the last 168 hours. No results for input(s): AMMONIA in the last 168 hours. CBC: Recent Labs  Lab 09/15/19 2121 09/17/19 0258  WBC 13.5* 8.0  NEUTROABS 11.5*  --   HGB 14.2 13.1  HCT 43.9 40.0  MCV 90.3 88.3  PLT 160 147*   Cardiac Enzymes: No results for input(s): CKTOTAL, CKMB, CKMBINDEX, TROPONINI in the last 168 hours. BNP: Invalid input(s): POCBNP CBG: No results for input(s): GLUCAP in the last 168 hours. D-Dimer No results for input(s): DDIMER in the last 72 hours. Hgb A1c No results for input(s): HGBA1C in the last 72 hours. Lipid Profile No results for input(s): CHOL, HDL, LDLCALC, TRIG, CHOLHDL, LDLDIRECT in the last 72 hours. Thyroid function studies No results for input(s): TSH, T4TOTAL, T3FREE, THYROIDAB in the last 72 hours.  Invalid input(s): FREET3 Anemia work up No results for input(s): VITAMINB12, FOLATE, FERRITIN, TIBC, IRON, RETICCTPCT in the last 72 hours. Urinalysis    Component Value Date/Time   COLORURINE AMBER (A) 09/16/2019 0016   APPEARANCEUR TURBID (A) 09/16/2019 0016   LABSPEC 1.015 09/16/2019 0016   PHURINE 5.0 09/16/2019 0016   GLUCOSEU 50 (A) 09/16/2019 0016   GLUCOSEU NEGATIVE 01/01/2019 1529   HGBUR LARGE (A) 09/16/2019 0016   BILIRUBINUR NEGATIVE 09/16/2019 0016   KETONESUR 5 (A) 09/16/2019 0016   PROTEINUR 30 (A) 09/16/2019 0016   UROBILINOGEN 0.2 01/01/2019 1529   NITRITE POSITIVE (A) 09/16/2019 0016   LEUKOCYTESUR LARGE (A) 09/16/2019 0016   Sepsis Labs Invalid input(s): PROCALCITONIN,  WBC,  LACTICIDVEN Microbiology Recent Results (from the past 240 hour(s))  SARS CORONAVIRUS 2 (TAT 6-24 HRS) Nasopharyngeal Nasopharyngeal Swab     Status: None   Collection Time: 09/15/19 10:54 PM   Specimen: Nasopharyngeal Swab  Result Value Ref Range Status   SARS Coronavirus 2 NEGATIVE  NEGATIVE Final    Comment: (NOTE) SARS-CoV-2 target nucleic acids are NOT DETECTED. The SARS-CoV-2 RNA is generally detectable in upper and lower respiratory specimens during the acute phase of infection. Negative results do not preclude SARS-CoV-2 infection, do not rule out co-infections with other pathogens, and should not be used as the sole basis for treatment or other patient management decisions. Negative results must be combined with clinical observations, patient history, and epidemiological information. The expected result is  Negative. Fact Sheet for Patients: SugarRoll.be Fact Sheet for Healthcare Providers: https://www.woods-mathews.com/ This test is not yet approved or cleared by the [place] FDA and  has been authorized for detection and/or diagnosis of SARS-CoV-2 by FDA under an Emergency Use Authorization (EUA). This EUA will remain  in effect (meaning this test can be used) for the duration of the COVID-19 declaration under Section 56 4(b)(1) of the Act, 21 U.S.C. section 360bbb-3(b)(1), unless the authorization is terminated or revoked sooner. Performed at [facility], [place] [address] [place], [place] [address]   Culture, Urine     Status: Abnormal   Collection Time: 09/16/19  4:03 AM   Specimen: Urine, Random  Result Value Ref Range Status   Specimen Description URINE, RANDOM  Final   Special Requests   Final    NONE Performed at [facility], [place] [address], [place], [place] [address]    Culture >=100,000 COLONIES/mL KLEBSIELLA PNEUMONIAE (A)  Final   Report Status 09/18/2019 FINAL  Final   Organism ID, Bacteria KLEBSIELLA PNEUMONIAE (A)  Final      Susceptibility   Klebsiella pneumoniae - MIC*    AMPICILLIN >=32 RESISTANT Resistant     CEFAZOLIN <=4 SENSITIVE Sensitive     CEFTRIAXONE <=1 SENSITIVE Sensitive     CIPROFLOXACIN <=0.25 SENSITIVE Sensitive     GENTAMICIN <=1 SENSITIVE Sensitive      IMIPENEM <=0.25 SENSITIVE Sensitive     NITROFURANTOIN 128 RESISTANT Resistant     TRIMETH/SULFA <=20 SENSITIVE Sensitive     AMPICILLIN/SULBACTAM 4 SENSITIVE Sensitive     PIP/TAZO <=4 SENSITIVE Sensitive     Extended ESBL NEGATIVE Sensitive     * >=100,000 COLONIES/mL KLEBSIELLA PNEUMONIAE     Time coordinating discharge: 35 minutes  SIGNED:   Cordelia Poche, MD [facility] 09/22/2019, 3:13 PM

## 2019-09-22 NOTE — Progress Notes (Signed)
Inpatient Rehab Admissions:  Inpatient Rehab Consult received.  I met with pt and his wife at the bedside for rehabilitation assessment and to discuss goals and expectations of an inpatient rehab admission. Feel pt is a good candidate for CIR, if he is agreeable.   Will follow up with pt and his wife later today after they have time to discuss venue preference amongst themselves.   Jhonnie Garner, OTR/L  Rehab Admissions Coordinator  [phone] 09/22/2019 11:15 AM

## 2019-09-22 NOTE — Plan of Care (Signed)
Plan of care adequate for discharge.

## 2019-09-22 NOTE — Progress Notes (Signed)
Inpatient Rehabilitation-Admissions Coordinator   Pt and his wife have decided to pursue CIR. I have medical clearance from Dr. Lonny Prude for admit to CIR today. I have updated TOC team and will update RN on plan for admit today.   Please call if questions.   Jhonnie Garner, OTR/L  Rehab Admissions Coordinator  [phone] 09/22/2019 2:24 PM

## 2019-09-22 NOTE — Progress Notes (Signed)
Izora Ribas, MD  Physician  Physical Medicine and Rehabilitation  Consult Note  Signed  Date of Service:  09/22/2019 8:20 AM      Related encounter: ED to Hosp-Admission (Discharged) from 09/15/2019 in [facility]      Signed      Expand All Collapse All         Physical Medicine and Rehabilitation Consult  Reason for Consult: Functional decline due to baclofen toxicity/seizures Referring Physician: Dr. Lonny Prude   HPI: David Kim is a 73 y.o. male with history of HTN, OSA, paraplegia with spastic hemiplegia due to spinal tumor and was admitted on 09/15/19 with reports of one week history of mild confusion progressing to lethargy for 48 hours followed by seizure leading to admission.  He was found ot have acute renal failure with  Klebsiella pneumonia UTI in setting of high doses of baclofen. He was started on IVF for hydration as well as IV antibiotics. CT head/MRI brain negative for acute changes and Dr. Lorraine Lax felt that MS changes due to baclofen toxicity and dose decrease. EEG negative for Mental status has continued to wax and wane with significant bouts of agitation question due to baclofen withdrawal therefore dose increased to 20 mg qid.    ROS as indicated below. Also with + constipation. Wife usually performs digital stimulation for patient at home but has not at this time since he has mostly been consuming liquids and feels comfortable.   At baseline, patient uses sliding board for transfers and receives assistance from wife. He is able to get in car to get to his medical appointments. He would prefer to be discharged home if possible as he fears risk of COVID while in hospital.   Review of Systems  Constitutional: Negative for chills and fever.  HENT: Negative for hearing loss.   Eyes: Negative for blurred vision and double vision.  Respiratory: Negative for cough and hemoptysis.   Cardiovascular: Negative for chest pain and  palpitations.  Gastrointestinal: Negative for heartburn and nausea.  Genitourinary: Negative for dysuria.  Musculoskeletal: Negative for myalgias and neck pain.  Neurological: Positive for sensory change (no sensation from waist down) and weakness.  Psychiatric/Behavioral: Negative for memory loss.          Past Medical History:  Diagnosis Date   Allergic rhinitis    ALLERGIC RHINITIS 10/01/2007   Qualifier: Diagnosis of  By: Jenny Reichmann MD, Hunt Oris    Anxiety    Carpal tunnel syndrome    right   Depression    Depression    Erectile dysfunction    Fatigue    GERD (gastroesophageal reflux disease)    Gout    Hypertension    HYPOGONADISM 03/31/2008   Qualifier: Diagnosis of  By: Jenny Reichmann MD, Hunt Oris    IBS (irritable bowel syndrome)    Low back pain    OSA (obstructive sleep apnea)    Paraplegia ([place]) 04/23/2009   Qualifier: Diagnosis of  By: Jenny Reichmann MD, Hunt Oris    Peptic ulcer disease    PEPTIC ULCER DISEASE 10/01/2007   Qualifier: Diagnosis of  By: Jenny Reichmann MD, Hunt Oris    Prostate cancer [facility])    PROSTATE CANCER, HX OF 05/23/2007   Qualifier: Diagnosis of  By: Jenny Reichmann MD, [place] PARALYSIS 04/23/2009   Qualifier: Diagnosis of  By: Jenny Reichmann MD, Hunt Oris    Thoracic spinal cord injury ([place]) 03/12/2012  Past Surgical History:  Procedure Laterality Date   ARTERY AND TENDON REPAIR Left 09/06/2018   Procedure: ARTERY AND TENDON REPAIR;  Surgeon: Charlotte Crumb, MD;  Location: [place];  Service: Orthopedics;  Laterality: Left;   CAST APPLICATION Left [id number]   Procedure: CAST APPLICATION;  Surgeon: Charlotte Crumb, MD;  Location: [place];  Service: Orthopedics;  Laterality: Left;   inguinal heniorrhaphy     left leg surgery after fibula     NERVE REPAIR Left 09/06/2018   Procedure: NERVE REPAIR TIMES TWO;  Surgeon: Charlotte Crumb, MD;  Location: [place];  Service: Orthopedics;  Laterality: Left;   OPEN REDUCTION INTERNAL  FIXATION (ORIF) DISTAL PHALANX Left 09/06/2018   Procedure: OPEN REDUCTION INTERNAL FIXATION (ORIF) LEFT LONG FINGER;  Surgeon: Charlotte Crumb, MD;  Location: [place];  Service: Orthopedics;  Laterality: Left;   PILONIDAL CYST / SINUS EXCISION     PROSTATECTOMY     tonsillectomy     WOUND EXPLORATION Left 09/06/2018   Procedure: EXPLORATION OFCOMPLEX INJURY;  Surgeon: Charlotte Crumb, MD;  Location: [place];  Service: Orthopedics;  Laterality: Left;         Family History  Problem Relation Age of Onset   High blood pressure Mother    Dementia Mother    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 He reports that he has never smoked. He has never used smokeless tobacco. He reports that he does not drink alcohol or use drugs.        Allergies  Allergen Reactions   Amoxicillin Other (See Comments)   Endal Hd Other (See Comments)          Medications Prior to Admission  Medication Sig Dispense Refill   antiseptic oral rinse (BIOTENE) LIQD 15 mLs by Mouth Rinse route as needed for dry mouth.     aspirin (ECOTRIN LOW STRENGTH) 81 MG EC tablet Take 81 mg by mouth daily.       baclofen (LIORESAL) 10 MG tablet Take 1 tablet (10 mg total) by mouth 4 (four) times daily. Taking additional 10mg  tablet (1/2 tab in 0500 and 1700). 360 each 4   baclofen (LIORESAL) 20 MG tablet Take 1 tablet (20 mg total) by mouth 4 (four) times daily. 360 tablet 4   CALCIUM-MAGNESIUM PO Take 1 tablet by mouth daily.      cholecalciferol (VITAMIN D3) 25 MCG (1000 UT) tablet Take 1,000 Units by mouth daily.     clonazePAM (KLONOPIN) 2 MG tablet Take 2 tablets (4 mg total) by mouth at bedtime. 180 tablet 3   losartan (COZAAR) 50 MG tablet Take 1 tablet (50 mg total) by mouth 2 (two) times daily. 180 tablet 3   Multiple Vitamin (MULTIVITAMIN) capsule Take 1 capsule by mouth daily.     OVER THE  COUNTER MEDICATION Take 22.5 mLs by mouth daily. Takes 1.5 tbsp  Mesotrace MV powder     oxybutynin (DITROPAN) 5 MG tablet Take 5 mg by mouth 4 (four) times daily.      tiZANidine (ZANAFLEX) 4 MG tablet take 1-2 tablets by mouth four times a day 620 tablet 3   vitamin C (ASCORBIC ACID) 500 MG tablet Take 500 mg by mouth daily.     vitamin E (VITAMIN E) 400 UNIT capsule Take 800 Units by mouth daily.     Zinc Oxide (SECURA EXTRA PROTECTIVE EX) Apply 1 application topically daily. To groin area  Home: Home Living Family/patient expects to be discharged to:: Private residence Living Arrangements: Spouse/significant other Available Help at Discharge: Family, Available 24 hours/day Type of Home: House Home Access: [place]: [place]: [place], Other (comment)(sliding board) Additional Comments: pt sponge bath only.   Functional History: Prior Function Level of Independence: Needs assistance Gait / Transfers Assistance Needed: pt's wife reports he required mod assist for bed mobility for BLE, some assistance for slide board transfers to his w/c, mod I in w/c, wife could leave pt alone if necessary ADL's / Homemaking Assistance Needed: pt can feed himself and complete UB bathing supine. pt require wife (A) to swing to EOB and (A) to sliding board to wheelchair. pt was able to sustain supervision EOB for wife ot position chair Functional Status:  Mobility: Bed Mobility Overal bed mobility: Needs Assistance Bed Mobility: Sit to Supine, Supine to Sit Supine to sit: Max assist, HOB elevated(wife provides physical assist at trunk) Sit to supine: Max assist, HOB elevated General bed mobility comments: pt requires max (A) of pad to scoot to EOB. pt with bil Ue used to brace static sitting with gradual L lean. pt with L attention gaze this session. pt needs cues to find midline. wife reports this is new. pt drowzy this session.    Transfers General transfer comment: deferred to arousal and [place] only sustained  ADL: ADL Overall ADL's : Needs assistance/impaired Eating/Feeding: Minimal assistance, Bed level Grooming: Moderate assistance, Bed level Upper Body Bathing: Maximal assistance, Bed level Lower Body Bathing: Total assistance General ADL Comments: pt transfered to EOB with max (A) and then progressive lean to the L side with increase (A) from therapist. wife reports normally supervision level   Cognition: Cognition Overall Cognitive Status: Impaired/Different from baseline Orientation Level: Oriented X4 Cognition Arousal/Alertness: Lethargic Behavior During Therapy: Flat affect Overall Cognitive Status: Impaired/Different from baseline Area of Impairment: Orientation, Memory, Following commands, Safety/judgement, Awareness, Problem solving, Attention Orientation Level: Disoriented to, Situation, Person, Place, Time Current Attention Level: Focused Memory: Decreased recall of precautions, Decreased short-term memory Following Commands: Follows one step commands consistently, Follows one step commands with increased time Safety/Judgement: Decreased awareness of safety, Decreased awareness of deficits Awareness: Intellectual Problem Solving: Slow processing General Comments: pt reports "they gave me something last night' pt unaware of reason for admission and insisting on d/c with wife home but states "she wont bring my chair" pt educated on reason for admission and settles   Blood pressure 107/61, pulse 69, temperature 98 F (36.7 C), resp. rate 20, height 5\' 8"  (1.727 m), weight 88 kg, SpO2 98 %. Physical Exam  Nursing note and vitals reviewed. Constitutional: He appears well-developed and well-nourished.  Sitting up in bed.   Cardiac: No edema. Normal S1 and S2. RRR.  Respiratory: No stridor.  GI: He exhibits no distension. There is no abdominal tenderness.  Neurological/MSK: He is alert.  Oriented to self and place---not to situation. Slow to answer.  Paraplegia with LLE significantly flexed at knee so leg is underneath body.  Sensation intact in bilateral UE, absent in bilateral LE. Intact at T10 and above. Normal finger to nose bilaterally. CN 2-12 intact. 5/5 strength bilaterally in upper extremities; trace movement in LLE. Significant spasticity in bilateral lower extremities, possible LLE knee flexion contracture.  Psychiatry: Normal insight, judgement, mood. Flat affect.  Skin: No cyanosis or rashes  Lab Results Last 24 Hours  No results found for this or any previous visit (from the past  24 hour(s)).   Imaging Results (Last 48 hours)  No results found.     Assessment/Plan: Diagnosis: Impaired mobility and ADLs secondary to acute metabolic encephalopathy.  1. Does the need for close, 24 hr/day medical supervision in concert with the patient's rehab needs make it unreasonable for this patient to be served in a less intensive setting? Yes 2. Co-Morbidities requiring supervision/potential complications: HTN, OSA, paraplegia with spastic hemiplegia 3. Due to bladder management, bowel management, safety, skin/wound care, disease management, medication administration, pain management and patient education, does the patient require 24 hr/day rehab nursing? Yes 4. Does the patient require coordinated care of a physician, rehab nurse, therapy disciplines of OT,PT to address physical and functional deficits in the context of the above medical diagnosis(es)? Yes Addressing deficits in the following areas: balance, endurance, locomotion, strength, transferring, bowel/bladder control, bathing, dressing, feeding, grooming, toileting, cognition, speech, language, swallowing and psychosocial support 5. Can the patient actively participate in an intensive therapy program of at least 3 hrs of therapy per day at least 5 days per week? Yes 6. The potential for patient to make measurable  gains while on inpatient rehab is excellent 7. Anticipated functional outcomes upon discharge from inpatient rehab are min assist  with PT, min assist with OT, independent with SLP. 8. Estimated rehab length of stay to reach the above functional goals is: 2 weeks 9. Anticipated discharge destination: Home 10. Overall Rehab/Functional Prognosis: excellent  RECOMMENDATIONS: This patient's condition is appropriate for continued rehabilitative care in the following setting: CIR Patient has agreed to participate in recommended program. Yes Note that insurance prior authorization may be required for reimbursement for recommended care.  Comment: David Kim would be an excellent inpatient rehabilitation candidate. He is a patient of Dr. Naaman Plummer and would benefit from rehabilitation to improve his safety and independence prior to returning home, as well as for improved spasticity control, mobility, and prevention of pressure ulcers. His goal would be to return to his baseline function of MinA from his wife for sliding board transfers and transfers into his vehicle, as well as improved spasticity control.   Bary Leriche, PA-C 09/22/2019   I have personally performed a face to face diagnostic evaluation, including, but not limited to relevant history and physical exam findings, of this patient and developed relevant assessment and plan.  Additionally, I have reviewed and concur with the physician assistant's documentation above.  Leeroy Cha, MD         Revision History Date/Time User Provider Type Action  09/22/2019 11:04 AM Ranell Patrick, Clide Deutscher, MD Physician Sign  09/22/2019 10:45 AM Ranell Patrick, Clide Deutscher, MD Physician Share  09/22/2019 8:56 AM Love, Ivan Anchors, PA-C Physician Assistant Share  View Details Report     Routing History

## 2019-09-22 NOTE — H&P (Signed)
Physical Medicine and Rehabilitation Admission H&P        Chief Complaint  Patient presents with  . Functional decline       HPI: David Kim is a 73 year old male with history of HTN, OSA- CPAP, s/p ependymoma with spastic paraplegia who was admitted on 09/15/19 with one week history of lethargy progressing to AKI and seizure. He was started on Keppra and MRI brain negative for acute changes.  He ws found to have Klebsiella UTI and treated with IV rocephin as well as IVF with improvement in mentation.  Dr. Lorraine Lax felt that MS changes likely due to baclofen toxicity in setting of AKI and dose adjusted. AKI has resolved but he started having agitation with bouts of lethargy felt to be due to baclofen withdrawal therefor dose titrate back upwards. Therapy evaluations completed and patient noted to be limited by significant spasticity affecting mobility and ADLs. CIR recommended due to functional decline.      Review of Systems  Constitutional: Negative for chills and fever.  HENT: Negative for hearing loss.   Eyes: Negative for blurred vision and double vision.  Respiratory: Negative for cough and shortness of breath.   Cardiovascular: Negative for chest pain and palpitations.  Gastrointestinal: Positive for constipation. Negative for heartburn and nausea.  Musculoskeletal: Negative for myalgias.  Skin: Negative for rash.  Neurological: Positive for sensory change and focal weakness. Negative for headaches.  Psychiatric/Behavioral: Positive for memory loss. Negative for depression. The patient is not nervous/anxious.           Past Medical History:  Diagnosis Date  . Allergic rhinitis    . ALLERGIC RHINITIS 10/01/2007    Qualifier: Diagnosis of  By: Jenny Reichmann MD, Hunt Oris   . Anxiety    . Carpal tunnel syndrome      right  . Depression    . Depression    . Erectile dysfunction    . Fatigue    . GERD (gastroesophageal reflux disease)    . Gout    . Hypertension    .  HYPOGONADISM 03/31/2008    Qualifier: Diagnosis of  By: Jenny Reichmann MD, Hunt Oris   . IBS (irritable bowel syndrome)    . Low back pain    . OSA (obstructive sleep apnea)    . Paraplegia ([place]) 04/23/2009    Qualifier: Diagnosis of  By: Jenny Reichmann MD, Hunt Oris   . Peptic ulcer disease    . PEPTIC ULCER DISEASE 10/01/2007    Qualifier: Diagnosis of  By: Jenny Reichmann MD, Hunt Oris   . Prostate cancer [facility])    . PROSTATE CANCER, HX OF 05/23/2007    Qualifier: Diagnosis of  By: Jenny Reichmann MD, [place] PARALYSIS 04/23/2009    Qualifier: Diagnosis of  By: Jenny Reichmann MD, Hunt Oris   . Thoracic spinal cord injury ([place]) 03/12/2012           Past Surgical History:  Procedure Laterality Date  . ARTERY AND TENDON REPAIR Left 09/06/2018    Procedure: ARTERY AND TENDON REPAIR;  Surgeon: Charlotte Crumb, MD;  Location: [place];  Service: Orthopedics;  Laterality: Left;  . CAST APPLICATION Left [id number]    Procedure: CAST APPLICATION;  Surgeon: Charlotte Crumb, MD;  Location: [place];  Service: Orthopedics;  Laterality: Left;  . inguinal heniorrhaphy      . left leg surgery after fibula      . NERVE REPAIR Left 09/06/2018    Procedure:  NERVE REPAIR TIMES TWO;  Surgeon: Charlotte Crumb, MD;  Location: [place];  Service: Orthopedics;  Laterality: Left;  . OPEN REDUCTION INTERNAL FIXATION (ORIF) DISTAL PHALANX Left 09/06/2018    Procedure: OPEN REDUCTION INTERNAL FIXATION (ORIF) LEFT LONG FINGER;  Surgeon: Charlotte Crumb, MD;  Location: [place];  Service: Orthopedics;  Laterality: Left;  . PILONIDAL CYST / SINUS EXCISION      . PROSTATECTOMY      . tonsillectomy      . WOUND EXPLORATION Left 09/06/2018    Procedure: EXPLORATION OFCOMPLEX INJURY;  Surgeon: Charlotte Crumb, MD;  Location: [place];  Service: Orthopedics;  Laterality: Left;           Family History  Problem Relation Age of Onset  . High blood pressure Mother    . Dementia Mother    . Diabetes Father        Social History:  Married. Disabled Dealer. Wife assisted  with transfers and ADLs. He was able to self cath and wife manages his bowel program. He  reports that he has never smoked. He has never used smokeless tobacco. He reports that he does not drink alcohol or use drugs.          Allergies  Allergen Reactions  . Amoxicillin Other (See Comments)  . Endal Hd Other (See Comments)          Medications Prior to Admission  Medication Sig Dispense Refill  . antiseptic oral rinse (BIOTENE) LIQD 15 mLs by Mouth Rinse route as needed for dry mouth.      Marland Kitchen aspirin (ECOTRIN LOW STRENGTH) 81 MG EC tablet Take 81 mg by mouth daily.        . baclofen (LIORESAL) 10 MG tablet Take 1 tablet (10 mg total) by mouth 4 (four) times daily. Taking additional 10mg  tablet (1/2 tab in 0500 and 1700). 360 each 4  . baclofen (LIORESAL) 20 MG tablet Take 1 tablet (20 mg total) by mouth 4 (four) times daily. 360 tablet 4  . CALCIUM-MAGNESIUM PO Take 1 tablet by mouth daily.       . cholecalciferol (VITAMIN D3) 25 MCG (1000 UT) tablet Take 1,000 Units by mouth daily.      . clonazePAM (KLONOPIN) 2 MG tablet Take 2 tablets (4 mg total) by mouth at bedtime. 180 tablet 3  . losartan (COZAAR) 50 MG tablet Take 1 tablet (50 mg total) by mouth 2 (two) times daily. 180 tablet 3  . Multiple Vitamin (MULTIVITAMIN) capsule Take 1 capsule by mouth daily.      Marland Kitchen OVER THE COUNTER MEDICATION Take 22.5 mLs by mouth daily. Takes 1.5 tbsp  Mesotrace MV powder      . oxybutynin (DITROPAN) 5 MG tablet Take 5 mg by mouth 4 (four) times daily.       Marland Kitchen tiZANidine (ZANAFLEX) 4 MG tablet take 1-2 tablets by mouth four times a day 620 tablet 3  . vitamin C (ASCORBIC ACID) 500 MG tablet Take 500 mg by mouth daily.      . vitamin E (VITAMIN E) 400 UNIT capsule Take 800 Units by mouth daily.      . Zinc Oxide (SECURA EXTRA PROTECTIVE EX) Apply 1 application topically daily. To groin area          Drug Regimen Review  Drug regimen was reviewed and remains appropriate with no significant issues  identified   Home: Home Living Family/patient expects to be discharged to:: Private residence Living Arrangements: Spouse/significant other Available Help  at Discharge: Family, Available 24 hours/day Type of Home: House Home Access: Ramped entrance Home Layout: One level Home Equipment: Ferdinand, Other (comment)(sliding board) Additional Comments: pt sponge bath only.   Lives With: Spouse   Functional History: Prior Function Level of Independence: Needs assistance Gait / Transfers Assistance Needed: pt's wife reports he required mod assist for bed mobility for BLE, some assistance for slide board transfers to his w/c, mod I in w/c, wife could leave pt alone if necessary ADL's / Homemaking Assistance Needed: pt can feed himself and complete UB bathing supine. pt require wife (A) to swing to EOB and (A) to sliding board to wheelchair. pt was able to sustain supervision EOB for wife ot position chair   Functional Status:  Mobility: Bed Mobility Overal bed mobility: Needs Assistance Bed Mobility: Supine to Sit Supine to sit: Mod assist Sit to supine: Max assist, HOB elevated General bed mobility comments: requires mod assist to bring LEs off side of bed, patient is able to use bed rail to sit trunk up Transfers Overall transfer level: Needs assistance Equipment used: Sliding board Transfers: Lateral/Scoot Transfers  Lateral/Scoot Transfers: Min assist General transfer comment: min assist to position sliding board, cues for scooting and positioning. Ambulation/Gait General Gait Details: non-ambulatory   ADL: ADL Overall ADL's : Needs assistance/impaired Eating/Feeding: Minimal assistance, Bed level Grooming: Moderate assistance, Bed level Upper Body Bathing: Maximal assistance, Bed level Lower Body Bathing: Total assistance General ADL Comments: pt transfered to EOB with max (A) and then progressive lean to the L side with increase (A) from therapist. wife reports  normally supervision level    Cognition: Cognition Overall Cognitive Status: Within Functional Limits for tasks assessed Arousal/Alertness: Awake/alert Orientation Level: Oriented to person, Oriented to place, Disoriented to situation, Disoriented to time Attention: Focused, Sustained Focused Attention: Appears intact Sustained Attention: Impaired Sustained Attention Impairment: Verbal basic Memory: Impaired Memory Impairment: Storage deficit, Retrieval deficit, Decreased recall of new information, Decreased short term memory, Prospective memory Awareness: Impaired Awareness Impairment: Intellectual impairment, Emergent impairment, Anticipatory impairment Problem Solving: Impaired Problem Solving Impairment: Verbal basic Executive Function: Reasoning, Organizing Reasoning: Impaired Reasoning Impairment: Verbal basic Organizing: Impaired Organizing Impairment: Verbal basic Safety/Judgment: Impaired Cognition Arousal/Alertness: Awake/alert Behavior During Therapy: WFL for tasks assessed/performed, Flat affect Overall Cognitive Status: Within Functional Limits for tasks assessed Area of Impairment: Attention, Awareness, Following commands Orientation Level: Time, Situation Current Attention Level: Focused Memory: Decreased short-term memory Following Commands: Follows one step commands with increased time Safety/Judgement: Decreased awareness of safety, Decreased awareness of deficits Awareness: Intellectual Problem Solving: Slow processing, Decreased initiation, Requires verbal cues General Comments: patient more alert and oriented this visit, wife present     Blood pressure 124/67, pulse 85, temperature 97.6 F (36.4 C), temperature source Oral, resp. rate 18, height 5\' 8"  (1.727 m), weight 88 kg, SpO2 99 %. Physical Exam  Nursing note and vitals reviewed. Constitutional: He appears well-developed and well-nourished. No distress.  HENT:  Head: Normocephalic.  Eyes: Pupils  are equal, round, and reactive to light. Conjunctivae and EOM are normal. Right eye exhibits no discharge. Left eye exhibits no discharge.  Visual acuity slightly impaired. Can't appreciate cataracts on exam  Neck: Normal range of motion. No tracheal deviation present. No thyromegaly present.  Cardiovascular: Normal rate and regular rhythm. Exam reveals no friction rub.  No murmur heard. Respiratory: Effort normal and breath sounds normal. No respiratory distress. He has no wheezes. He has no rales.  GI: He exhibits no distension. There  is no abdominal tenderness.  Genitourinary:    Genitourinary Comments: Foley catheter in place   Musculoskeletal:        General: No edema.  Neurological:  Sl disoriented but not far from baseline. Oriented to person, place, reason he's here. Remembered me quickly when I saw him.  decr visual acuity. UE 5/5. LE: 0/5 bilateral LE's. DTR's 3+ bilateral LE with 2-3 beats clonus. Ongoing hip flexor tightness LLE. Minimal sensation to LT, Pain in LE's.   Skin: Skin is warm. He is not diaphoretic. No erythema.  Psychiatric:  Pt flat but cooperative. Not far from baseline.       Lab Results Last 48 Hours  No results found for this or any previous visit (from the past 48 hour(s)).   Imaging Results (Last 48 hours)  No results found.           Medical Problem List and Plan: 1.  Functional deficits secondary to encephalopathy d/t urosepsis, AKI in setting of T-10 myelopathy             -admit to inpatient rehab 2.  Antithrombotics: -DVT/anticoagulation:  Pharmaceutical: Lovenox             -antiplatelet therapy: N/A 3. Pain Management:  Tylenol prn 4. Mood: LCSW to follow for evaluation and support.              -antipsychotic agents: N/a 5. Neuropsych: This patient is not fully capable of making decisions on his own behalf.             -He's not too far from baseline at this point 6. Skin/Wound Care: Routine pressure relief measures. 7.  Fluids/Electrolytes/Nutrition:  Monitor I/O. Check lytes in am.  8. Acute renal failure: has resolved 26/1.43-->15/0.91.  9. Klebsiella UTI: On Ceftriaxone D#6/7 10. Seizures: Felt to be secondary to baclofen toxicity--continue Keppra  500 mg bid  11. Anxiety disorder: On ativan 1 mg IV every 6 hours currently--> d/c and transition to Klonopin 1 mg bid.  10. Paraplegia with spastic hemiplegia: On Tizanidine 4 mg qid, baclofen 20 mg ac/hs             -received botox to left hip flexors, hip adductors for persistent tone LLE. Was experiencing improvement until developing the UTI, baclofen reduction                         -monitor for now 11. HTN: Monitor BP bid--off cozaar at this time.  12. Neurogenic bowel and bladder: remove foley and begin voiding trial. He was voiding continently at home per wife. I question whether he really needed to be cathed for larger PVR's given UTI             -remove foley             -will scan for PVR's and cath as needed              -daily dig stim for bowel program as per home. 13. Severe OSA: Continue CPAP when napping/sleeping.        Bary Leriche, PA-C 09/22/2019  I have personally performed a face to face diagnostic evaluation of this patient and formulated the key components of the plan.  Additionally, I have personally reviewed laboratory data, imaging studies, as well as relevant notes and concur with the physician assistant's documentation above.  The patient's status has not changed from the original H&P.  Any changes in documentation from the  acute care chart have been noted above.  Meredith Staggers, MD, [facility]

## 2019-09-22 NOTE — Progress Notes (Signed)
Patient has home cpap for use, will self place when ready. Informed patient that should he need help to have his RN call. Will continue to monitor pt.

## 2019-09-23 ENCOUNTER — Inpatient Hospital Stay (HOSPITAL_COMMUNITY): Payer: Medicare Other

## 2019-09-23 ENCOUNTER — Inpatient Hospital Stay (HOSPITAL_COMMUNITY): Payer: Medicare Other | Admitting: Physical Therapy

## 2019-09-23 ENCOUNTER — Telehealth: Payer: Self-pay | Admitting: *Deleted

## 2019-09-23 DIAGNOSIS — R569 Unspecified convulsions: Secondary | ICD-10-CM

## 2019-09-23 LAB — COMPREHENSIVE METABOLIC PANEL
ALT: 20 U/L (ref 0–44)
AST: 20 U/L (ref 15–41)
Albumin: 3.2 g/dL — ABNORMAL LOW (ref 3.5–5.0)
Alkaline Phosphatase: 72 U/L (ref 38–126)
Anion gap: 12 (ref 5–15)
BUN: 15 mg/dL (ref 8–23)
CO2: 23 mmol/L (ref 22–32)
Calcium: 8.6 mg/dL — ABNORMAL LOW (ref 8.9–10.3)
Chloride: 101 mmol/L (ref 98–111)
Creatinine, Ser: 0.8 mg/dL (ref 0.61–1.24)
GFR calc Af Amer: >60 mL/min (ref >60)
GFR calc non Af Amer: >60 mL/min (ref >60)
Glucose, Bld: 103 mg/dL — ABNORMAL HIGH (ref 70–99)
Potassium: 4 mmol/L (ref 3.5–5.1)
Sodium: 136 mmol/L (ref 135–145)
Total Bilirubin: 0.4 mg/dL (ref 0.3–1.2)
Total Protein: 6.5 g/dL (ref 6.5–8.1)

## 2019-09-23 LAB — CBC WITH DIFFERENTIAL/PLATELET
Abs Immature Granulocytes: 0.4 10*3/uL — ABNORMAL HIGH (ref 0.00–0.07)
Basophils Absolute: 0.1 10*3/uL (ref 0.0–0.1)
Basophils Relative: 1 %
Eosinophils Absolute: 0.5 10*3/uL (ref 0.0–0.5)
Eosinophils Relative: 4 %
HCT: 43.7 % (ref 39.0–52.0)
Hemoglobin: 14.2 g/dL (ref 13.0–17.0)
Immature Granulocytes: 3 %
Lymphocytes Relative: 12 %
Lymphs Abs: 1.5 10*3/uL (ref 0.7–4.0)
MCH: 29 pg (ref 26.0–34.0)
MCHC: 32.5 g/dL (ref 30.0–36.0)
MCV: 89.2 fL (ref 80.0–100.0)
Monocytes Absolute: 0.8 10*3/uL (ref 0.1–1.0)
Monocytes Relative: 7 %
Neutro Abs: 8.9 10*3/uL — ABNORMAL HIGH (ref 1.7–7.7)
Neutrophils Relative %: 73 %
Platelets: 387 10*3/uL (ref 150–400)
RBC: 4.9 MIL/uL (ref 4.22–5.81)
RDW: 14.1 % (ref 11.5–15.5)
WBC: 12.1 10*3/uL — ABNORMAL HIGH (ref 4.0–10.5)
nRBC: 0 % (ref 0.0–0.2)

## 2019-09-23 MED ORDER — SULFAMETHOXAZOLE-TRIMETHOPRIM 800-160 MG PO TABS
1.0000 | ORAL_TABLET | Freq: Two times a day (BID) | ORAL | Status: DC
  Administered 2019-09-23 – 2019-09-25 (×5): 1 via ORAL
  Filled 2019-09-23 (×5): qty 1

## 2019-09-23 NOTE — Progress Notes (Signed)
Pt LBM was on 09/17/19, pt refused all parts of the bowel program. Pt stated " my wife usually uses a glove and is able to help the stool come out". Pt was educated on the risk and benefits of following the bowel program. Pt continued to refuse the medications.
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
## 2019-09-23 NOTE — Plan of Care (Signed)
  Problem: Consults Goal: RH GENERAL PATIENT EDUCATION Description: See Patient Education module for education specifics. Outcome: Progressing   Problem: RH BOWEL ELIMINATION Goal: RH STG MANAGE BOWEL WITH ASSISTANCE Description: STG Manage Bowel with  mod Assistance. Outcome: Progressing Goal: RH STG MANAGE BOWEL W/MEDICATION W/ASSISTANCE Description: STG Manage Bowel with Medication with mod Assistance. Outcome: Progressing   Problem: RH SKIN INTEGRITY Goal: RH STG SKIN FREE OF INFECTION/BREAKDOWN Description: Min assist Outcome: Progressing Goal: RH STG MAINTAIN SKIN INTEGRITY WITH ASSISTANCE Description: STG Maintain Skin Integrity With min Assistance. Outcome: Progressing   Problem: RH SAFETY Goal: RH STG ADHERE TO SAFETY PRECAUTIONS W/ASSISTANCE/DEVICE Description: STG Adhere to Safety Precautions With mod I Assistance/Device. Outcome: Progressing   Problem: RH PAIN MANAGEMENT Goal: RH STG PAIN MANAGED AT OR BELOW PT'S PAIN GOAL Description: 4 or less Outcome: Progressing   Problem: RH KNOWLEDGE DEFICIT GENERAL Goal: RH STG INCREASE KNOWLEDGE OF SELF CARE AFTER HOSPITALIZATION Description: Pt will be able to adhere to bowel regimen with min assist from significant other upon discharge.  Outcome: Progressing

## 2019-09-23 NOTE — Progress Notes (Signed)
[place] PHYSICAL MEDICINE & REHABILITATION PROGRESS NOTE   Subjective/Complaints: Had a reasonable night. Able to sleep. Pain controlled. Foley was still in when I saw him this AM  ROS: Patient denies fever, rash, sore throat, blurred vision, nausea, vomiting, diarrhea, cough, shortness of breath or chest pain, joint or back pain, headache, or mood change.    Objective:   No results found. Recent Labs    09/23/19 0516  WBC 12.1*  HGB 14.2  HCT 43.7  PLT 387   Recent Labs    09/23/19 0516  NA 136  K 4.0  CL 101  CO2 23  GLUCOSE 103*  BUN 15  CREATININE 0.80  CALCIUM 8.6*    Intake/Output Summary (Last 24 hours) at 09/23/2019 1149 Last data filed at 09/23/2019 0814 Gross per 24 hour  Intake 440 ml  Output 1250 ml  Net -810 ml     Physical Exam: Vital Signs Blood pressure 132/75, pulse 76, temperature 99.7 F (37.6 C), temperature source Oral, resp. rate 17, SpO2 96 %. Constitutional: He appearswell-developedand well-nourished.No distress.  HENT: abrasions around lips Head:Normocephalic.  Eyes:impaired but still somewhat functional visual acuity Neck:Normal range of motion.No tracheal deviationpresent. No thyromegalypresent.  Cardiovascular:Normal rateand regular rhythm. Exam revealsno friction rub. No murmurheard. Respiratory:Effort normaland breath sounds normal. Norespiratory distress. He hasno wheezes. He hasno rales.  GI: He exhibitsno distension. There isno abdominal tenderness.  Genitourinary: Genitourinary Comments: Foley catheter in place with sediment Musculoskeletal:  General: No edema.  Neurological: Fair insight. Followed commands. Ongoing cognitive delays.  UE 5/5. LE: 0/5 bilateral LE's. DTR's 3+ bilateral LE with 2-3 beats clonus. Ongoing hip flexor tightness LLE. Minimal sensation to LT, Pain in LE's. stable exam Skin: Skin iswarm. He isnot diaphoretic. Noerythema.  Psychiatric: Pt flat but  cooperative. Near baseline    Assessment/Plan: 1. Functional deficits secondary to encephalopathy d/t urosepsis and AKI which require 3+ hours per day of interdisciplinary therapy in a comprehensive inpatient rehab setting.  Physiatrist is providing close team supervision and 24 hour management of active medical problems listed below.  Physiatrist and rehab team continue to assess barriers to discharge/monitor patient progress toward functional and medical goals  Care Tool:  Bathing              Bathing assist       Upper Body Dressing/Undressing Upper body dressing        Upper body assist      Lower Body Dressing/Undressing Lower body dressing            Lower body assist       Toileting Toileting    Toileting assist Assist for toileting: Dependent - Patient 0%(foley)     Transfers Chair/bed transfer  Transfers assist     Chair/bed transfer assist level: Maximal Assistance - Patient 25 - 49%     Locomotion Ambulation   Ambulation assist   Ambulation activity did not occur: N/A          Walk 10 feet activity   Assist  Walk 10 feet activity did not occur: N/A        Walk 50 feet activity   Assist Walk 50 feet with 2 turns activity did not occur: N/A         Walk 150 feet activity   Assist Walk 150 feet activity did not occur: N/A         Walk 10 feet on uneven surface  activity   Assist Walk 10 feet on uneven surfaces  activity did not occur: N/A         Wheelchair     Assist Will patient use wheelchair at discharge?: Yes Type of Wheelchair: Manual(primarily manual w/c use at baseline)    Wheelchair assist level: Supervision/Verbal cueing Max wheelchair distance: 150'    Wheelchair 50 feet with 2 turns activity    Assist        Assist Level: Supervision/Verbal cueing   Wheelchair 150 feet activity     Assist      Assist Level: Supervision/Verbal cueing   Blood pressure 132/75,  pulse 76, temperature 99.7 F (37.6 C), temperature source Oral, resp. rate 17, SpO2 96 %.  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 Antithrombotics: -DVT/anticoagulation:Pharmaceutical:Lovenox -antiplatelet therapy: N/A 3. Pain Management:Tylenol prn 4. Mood:LCSW to follow for evaluation and support. -antipsychotic agents: N/a 5. Neuropsych: This patientis not fullycapable of making decisions on hisown behalf. -He's not too far from baseline at this point 6. Skin/Wound Care:Routine pressure relief measures. 7. Fluids/Electrolytes/Nutrition: good appetite!  -I personally reviewed the patient's labs today.   8. Acute renal failure: has resolved 26/1.43-->15/0.91.  9. Klebsiella UTI: ceftriaxone completed  -apparently a lot of sediment and "pus" when foley was removed   -wbc's 12.1 today (increased from 8)  -will re-check ucx today  -begin bactrim ds after culture collected 10. Seizures: Felt to be secondary to baclofen toxicity?? And/or urosepsis--continue Keppra 500 mg bid for now.    -question need to continue long term. 11. Anxiety disorder: On ativan 1 mg IV every 6 hours currently--> d/c and transition to Klonopin 1 mg bid. 10. Paraplegia with spastic hemiplegia: OnTizanidine 4 mg qid, baclofen 20 mg ac/hs -received botox to left hip flexors, hip adductors for persistent tone LLE about 2 weeks ago.  Was experiencing improvement until developing the UTI, baclofen reduction -monitor for now 11. HTN: Monitor BP bid--off cozaar at this time. 12.Neurogenic bowel and bladder:remove foley and begin voiding trial. He was voiding continently at home per wife. I question whether he really needed to be cathed for larger PVR's given  UTI -remove foley -will scan for PVR's and cath as needed  -daily dig stim for bowel program as per home. 13. Severe OSA: Continue CPAP whennapping/sleeping.    LOS: 1 days A FACE TO FACE EVALUATION WAS PERFORMED  Meredith Staggers 09/23/2019, 11:49 AM
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
## 2019-09-23 NOTE — Progress Notes (Signed)
Pt foley was removed. lg amounts of puss and build up noted around the insertion site of the foley. Sediment in pts urine. Pt tolerated removal well. Voiding trial started at 0600.

## 2019-09-23 NOTE — Progress Notes (Signed)
Patient has home cpap and will self place for the evening. Will continue to monitor pt.

## 2019-09-23 NOTE — Progress Notes (Signed)
Inpatient Rehabilitation  Patient information reviewed and entered into eRehab system by Emanii Bugbee M. Savian Mazon, M.A., CCC/SLP, PPS Coordinator.  Information including medical coding, functional ability and quality indicators will be reviewed and updated through discharge.    
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
## 2019-09-23 NOTE — Care Management (Signed)
Inpatient Rehabilitation Center Individual Statement of Services  Patient Name:  David Kim  Date:  09/23/2019  Welcome to the [facility].  Our goal is to provide you with an individualized program based on your diagnosis and situation, designed to meet your specific needs.  With this comprehensive rehabilitation program, you will be expected to participate in at least 3 hours of rehabilitation therapies Monday-Friday, with modified therapy programming on the weekends.  Your rehabilitation program will include the following services:  Physical Therapy (PT), Occupational Therapy (OT), Speech Therapy (ST), 24 hour per day rehabilitation nursing, Therapeutic Recreaction (TR), Neuropsychology, Case Management (Social Worker), Rehabilitation Medicine, Nutrition Services and Pharmacy Services  Weekly team conferences will be held on Tuesdays to discuss your progress.  Your Social Worker will talk with you frequently to get your input and to update you on team discussions.  Team conferences with you and your family in attendance may also be held.  Expected length of stay: 5-7 days  Overall anticipated outcome: minimal assistance at w/c  Depending on your progress and recovery, your program may change. Your Social Worker will coordinate services and will keep you informed of any changes. Your Social Worker's name and contact numbers are listed  below.  The following services may also be recommended but are not provided by the [facility] will be made to provide these services after discharge if needed.  Arrangements include referral to agencies that provide these services.  Your insurance has been verified to be:  Medicare/ BCBS Your primary doctor is:  Dr. Jenny Reichmann  Pertinent information will be shared with your doctor and your insurance  company.  Social Worker:  [place], [place] or [phone]   Information discussed with and copy given to patient by: Lennart Pall, 09/23/2019, 4:52 PM

## 2019-09-24 ENCOUNTER — Inpatient Hospital Stay (HOSPITAL_COMMUNITY): Payer: Medicare Other

## 2019-09-24 ENCOUNTER — Inpatient Hospital Stay (HOSPITAL_COMMUNITY): Payer: Medicare Other | Admitting: Speech Pathology

## 2019-09-24 ENCOUNTER — Inpatient Hospital Stay (HOSPITAL_COMMUNITY): Payer: Medicare Other | Admitting: Physical Therapy

## 2019-09-24 NOTE — Plan of Care (Signed)
  Problem: Consults Goal: RH GENERAL PATIENT EDUCATION Description: See Patient Education module for education specifics. Outcome: Progressing   Problem: RH BOWEL ELIMINATION Goal: RH STG MANAGE BOWEL WITH ASSISTANCE Description: STG Manage Bowel with  mod Assistance. Outcome: Progressing   Problem: RH BOWEL ELIMINATION Goal: RH STG MANAGE BOWEL W/MEDICATION W/ASSISTANCE Description: STG Manage Bowel with Medication with mod Assistance. Outcome: Progressing   Problem: RH SKIN INTEGRITY Goal: RH STG SKIN FREE OF INFECTION/BREAKDOWN Description: Min assist Outcome: Progressing   Problem: RH SKIN INTEGRITY Goal: RH STG MAINTAIN SKIN INTEGRITY WITH ASSISTANCE Description: STG Maintain Skin Integrity With min Assistance. Outcome: Progressing   Problem: RH SAFETY Goal: RH STG ADHERE TO SAFETY PRECAUTIONS W/ASSISTANCE/DEVICE Description: STG Adhere to Safety Precautions With mod I Assistance/Device. Outcome: Progressing   Problem: RH PAIN MANAGEMENT Goal: RH STG PAIN MANAGED AT OR BELOW PT'S PAIN GOAL Description: 4 or less Outcome: Progressing   Problem: RH KNOWLEDGE DEFICIT GENERAL Goal: RH STG INCREASE KNOWLEDGE OF SELF CARE AFTER HOSPITALIZATION Description: Pt will be able to adhere to bowel regimen with min assist from significant other upon discharge.  Outcome: Progressing

## 2019-09-24 NOTE — Progress Notes (Signed)
Speech Language Pathology Daily Session Note  Patient Details  Name: David Kim MRN: [id number] Date of Birth: 06/03/1946  Today's Date: 09/24/2019 SLP Individual Time: 1400-1500 SLP Individual Time Calculation (min): 60 min  Short Term Goals: Week 1: SLP Short Term Goal 1 (Week 1): STG=LTG due to short ELOS  Skilled Therapeutic Interventions: Pt was seen for skilled ST targeting cognitive goals. Pt's wife was present throughout session. Pt verbally recalled his general home medication regimen with ~80% accuracy, per wife report. Pt required Mod A verbal cues for recall of medication functions. Max A verbal cues also required for mental flexibility in order to organize a QID pill box according to a list of current medications and dosages (which may differ from the way pt took them previously). Mod A verbal cues required for problem solving, Min A for error awareness throughout task. SLP emphasized recommendation for wife to provide full supervision/assist with medication management upon pt's return home to ensure accuracy and avoid under or over medicating. Pt left sitting in wheelchair with seatbelt alarm in place and wife still present. Continue per current plan of care.       Pain Pain Assessment Pain Scale: 0-10 Pain Score: 0-No pain  Therapy/Group: Individual Therapy  LAJOHN CROES 09/24/2019, 3:02 PM

## 2019-09-24 NOTE — Progress Notes (Signed)
Physical Therapy Session Note  Patient Details  Name: David Kim MRN: [id number] Date of Birth: 1946/05/31  Today's Date: 09/24/2019 PT Individual Time: 0800-0840 PT Individual Time Calculation (min): 40 min   Short Term Goals: Week 1:  PT Short Term Goal 1 (Week 1): =LTGs due to ELOS  Skilled Therapeutic Interventions/Progress Updates:   Pt in supine and agreeable to therapy, no c/o pain. R/L rolling w/ min assist while therapist threaded and donned shorts. Supine>sit w/ mod assist and verbal cues for technique. Switched pt to lightweight rigid frame w/c while he is admitted to CIR as w/c has foot plate to assist w/ safe positioning of LEs. Max assist slide board transfer to w/c w/ verbal and tactile cues for technique and sequencing. Pt states "this is not how I do it at home". Encouraged him to direct his care to this therapist and other therapists for carryover of tasks that are familiar to him. Pt self-propelled w/c 50' towards therapy gym w/ supervision using BUEs. Verbal cues to grade movement as he was often tipping (although safe w/ anti-tippers). If pt decreased his force, w/c propelled w/o tipping and at same speed. Added padding to foot plate to decrease spasms and used ACE wrap to provide support behind B lower legs w/ decreased spasms as well. Pt appears to have decreased spasms in general w/ sensory input. Returned to room total assist and ended session in w/c, all needs in reach.   Therapy Documentation Precautions:  Precautions Precautions: Fall Precaution Comments: spastic paraplegia Restrictions Weight Bearing Restrictions: No  Therapy/Group: Individual Therapy  Joahan Swatzell Clent Demark 09/24/2019, 8:44 AM

## 2019-09-24 NOTE — Progress Notes (Signed)
Occupational Therapy Session Note  Patient Details  Name: David Kim MRN: [id number] Date of Birth: 09-Apr-1946  Today's Date: 09/24/2019 OT Individual Time: 7921-7837 OT Individual Time Calculation (min): 60 min   Session 2:  OT Individual Time: 1300-1330 OT Individual Time Calculation (min): 30 min   Short Term Goals: Week 1:  OT Short Term Goal 1 (Week 1): STG = LTG d/t ELOS  Skilled Therapeutic Interventions/Progress Updates:    Pt received sitting up in w/c. Discussed L leg abrasion from spasm rubbing on side of chair and importance of skin checks/maintaining integrity. Discussed w/c evaluation/fitting and complaints from previous chair. Pt completed oral care at sink with set up assistance. Pt propelled w/c to the therapy gym, 150 ft, with min instructional cueing provided for propelling via rim instead of wheel and weight shift to reduce posterior tipping. Pt completed slideboard transfer to the mat with mod A, requiring assistance mostly to clear wheel. Pt transitioned to supine on mat when he reported need for urination. Pt rolled R with edge of mat for support with CGA. Pt transitioned into sitting EOM with mod cueing for UE placement/sequencing, min A overall. Pt returned to w/c with mod A. Pt transferred back to bed with mod A. He rolled R and L with min A for total A brief change. Incontinent BM in brief. Pt's wife arrived at end of session. Pt was left supine with all needs met, bed alarm set.    Session 2: Pt received supine with no c/o pain. Pt completed bed mobility training with no rails. He rolled R and L with cueing for use of UE, momentum, and caregiver assisted positioning. Pt required mod A x 4 trials. Brief checked for incontinence, none present. Pt transitioned to sitting EOB with mod A, requiring cueing for UE placement. Pt used slideboard to transfer to w/c, mod A overall. Cueing for head/hip relationship. Pt was left sitting up with all needs met, chair alarm set.  Wife, [name] in room.   Therapy Documentation Precautions:  Precautions Precautions: Fall Precaution Comments: spastic paraplegia Restrictions Weight Bearing Restrictions: No   Therapy/Group: Individual Therapy  Curtis Sites 09/24/2019, 9:23 AM

## 2019-09-24 NOTE — Progress Notes (Signed)
[place] PHYSICAL MEDICINE & REHABILITATION PROGRESS NOTE   Subjective/Complaints: Says he had a large bm (on my own) yesterday. RN charting confirms--actually overnight into this monring.   ROS: Patient denies fever, rash, sore throat, blurred vision, nausea, vomiting, diarrhea, cough, shortness of breath or chest pain, joint or back pain, headache, or mood change.   Objective:   No results found. Recent Labs    09/23/19 0516  WBC 12.1*  HGB 14.2  HCT 43.7  PLT 387   Recent Labs    09/23/19 0516  NA 136  K 4.0  CL 101  CO2 23  GLUCOSE 103*  BUN 15  CREATININE 0.80  CALCIUM 8.6*    Intake/Output Summary (Last 24 hours) at 09/24/2019 0832 Last data filed at 09/24/2019 0439 Gross per 24 hour  Intake 480 ml  Output 1150 ml  Net -670 ml     Physical Exam: Vital Signs Blood pressure 127/71, pulse 79, temperature 98.5 F (36.9 C), temperature source Oral, resp. rate 19, SpO2 97 %. Constitutional: No distress . Vital signs reviewed. HEENT: EOMI, oral membranes moist Neck: supple Cardiovascular: RRR without murmur. No JVD    Respiratory: CTA Bilaterally without wheezes or rales. Normal effort    GI: BS +, non-tender, non-distended  Genitourinary: foley out  Musculoskeletal:  General: No edema.  Neurological: Fair insight. Followed commands. Ongoing cognitive delays, delayed processing.   UE 5/5. LE: 0/5 bilateral LE's. DTR's 3+ bilateral LE with 2-3 beats clonus. Ongoing hip flexor tightness LLE. Minimal sensation to LT, Pain in LE's. Exam unchanged  Skin: Skin iswarm. He isnot diaphoretic. Noerythema.  Psychiatric: Flat but cooperative    Assessment/Plan: 1. Functional deficits secondary to encephalopathy d/t urosepsis and AKI which require 3+ hours per day of interdisciplinary therapy in a comprehensive inpatient rehab setting.  Physiatrist is providing close team supervision and 24 hour management of active medical problems listed  below.  Physiatrist and rehab team continue to assess barriers to discharge/monitor patient progress toward functional and medical goals  Care Tool:  Bathing    Body parts bathed by patient: Right arm, Left arm, Chest, Abdomen, Front perineal area, Right upper leg, Buttocks, Left upper leg, Right lower leg, Left lower leg, Face         Bathing assist Assist Level: Minimal Assistance - Patient > 75%     Upper Body Dressing/Undressing Upper body dressing   What is the patient wearing?: Pull over shirt    Upper body assist Assist Level: Supervision/Verbal cueing    Lower Body Dressing/Undressing Lower body dressing      What is the patient wearing?: Pants, Incontinence brief     Lower body assist Assist for lower body dressing: Maximal Assistance - Patient 25 - 49%     Toileting Toileting    Toileting assist Assist for toileting: Maximal Assistance - Patient 25 - 49%     Transfers Chair/bed transfer  Transfers assist     Chair/bed transfer assist level: Maximal Assistance - Patient 25 - 49%     Locomotion Ambulation   Ambulation assist   Ambulation activity did not occur: N/A          Walk 10 feet activity   Assist  Walk 10 feet activity did not occur: N/A        Walk 50 feet activity   Assist Walk 50 feet with 2 turns activity did not occur: N/A         Walk 150 feet activity   Assist  Walk 150 feet activity did not occur: N/A         Walk 10 feet on uneven surface  activity   Assist Walk 10 feet on uneven surfaces activity did not occur: N/A         Wheelchair     Assist Will patient use wheelchair at discharge?: Yes Type of Wheelchair: Manual(primarily manual w/c use at baseline)    Wheelchair assist level: Supervision/Verbal cueing Max wheelchair distance: 150'    Wheelchair 50 feet with 2 turns activity    Assist        Assist Level: Supervision/Verbal cueing   Wheelchair 150 feet activity      Assist      Assist Level: Supervision/Verbal cueing   Blood pressure 127/71, pulse 79, temperature 98.5 F (36.9 C), temperature source Oral, resp. rate 19, SpO2 97 %.  Medical Problem List and Plan: 1.Functional deficitssecondary to encephalopathy d/t urosepsis, AKI in setting of T-10 myelopathy Continue CIR therapies today including PT and OT  2. Antithrombotics: -DVT/anticoagulation:Pharmaceutical:Lovenox -antiplatelet therapy: N/A 3. Pain Management:Tylenol prn 4. Mood:LCSW to follow for evaluation and support. -antipsychotic agents: N/a 5. Neuropsych: This patientis not fullycapable of making decisions on hisown behalf. -He's not too far from baseline at this point 6. Skin/Wound Care:Routine pressure relief measures. 7. Fluids/Electrolytes/Nutrition: good appetite!     8. Acute renal failure: has resolved 26/1.43-->15/0.91.  9. Klebsiella UTI: ceftriaxone completed  -apparently a lot of sediment and "pus" when foley was removed   -wbc's 12.1 11/17 (increased from 8)  -f/u ucs pending  -began bactrim ds 11/17  -re-check cbc in am 10. Seizures: Felt to be secondary to baclofen toxicity?? And/or urosepsis--continue Keppra 500 mg bid for now.    -question need to continue long term. 11. Anxiety disorder: On ativan 1 mg IV every 6 hours currently--> d/c and transition to Klonopin 1 mg bid. 10. Paraplegia with spastic hemiplegia: OnTizanidine 4 mg qid, baclofen 20 mg ac/hs -received botox to left hip flexors, hip adductors for persistent tone LLE about 2 weeks ago.  Was experiencing improvement until developing the UTI -monitor for now 11. HTN: Monitor BP bid--off cozaar at this time. 12.Neurogenic bowel and bladder:remove foley and begin voiding trial. He was voiding continently at home per wife. I question whether he really needed to be cathed for larger PVR's given  UTI --foley out -+/- continent, pvr's low -moved bowels yesterday x 2  -can perform daily dig stim per home regimen. 13. Severe OSA: Continue CPAP whennapping/sleeping.    LOS: 2 days A FACE TO FACE EVALUATION WAS PERFORMED  Meredith Staggers 09/24/2019, 8:32 AM

## 2019-09-24 NOTE — Progress Notes (Signed)
RT offered assistance to pt with his home CPAP. Pt stated everything was ready and that he would be okay to place himself on. RT will continue to monitor.

## 2019-09-25 ENCOUNTER — Inpatient Hospital Stay (HOSPITAL_COMMUNITY): Payer: Medicare Other | Admitting: Physical Therapy

## 2019-09-25 ENCOUNTER — Inpatient Hospital Stay (HOSPITAL_COMMUNITY): Payer: Medicare Other | Admitting: Occupational Therapy

## 2019-09-25 ENCOUNTER — Inpatient Hospital Stay (HOSPITAL_COMMUNITY): Payer: Medicare Other | Admitting: Speech Pathology

## 2019-09-25 LAB — URINE CULTURE: Culture: 20000 — AB

## 2019-09-25 LAB — CBC WITH DIFFERENTIAL/PLATELET
Abs Immature Granulocytes: 0.21 10*3/uL — ABNORMAL HIGH (ref 0.00–0.07)
Basophils Absolute: 0.1 10*3/uL (ref 0.0–0.1)
Basophils Relative: 1 %
Eosinophils Absolute: 0.3 10*3/uL (ref 0.0–0.5)
Eosinophils Relative: 3 %
HCT: 44.5 % (ref 39.0–52.0)
Hemoglobin: 14.3 g/dL (ref 13.0–17.0)
Immature Granulocytes: 2 %
Lymphocytes Relative: 12 %
Lymphs Abs: 1.2 10*3/uL (ref 0.7–4.0)
MCH: 28.9 pg (ref 26.0–34.0)
MCHC: 32.1 g/dL (ref 30.0–36.0)
MCV: 90.1 fL (ref 80.0–100.0)
Monocytes Absolute: 0.6 10*3/uL (ref 0.1–1.0)
Monocytes Relative: 6 %
Neutro Abs: 7.6 10*3/uL (ref 1.7–7.7)
Neutrophils Relative %: 76 %
Platelets: 432 10*3/uL — ABNORMAL HIGH (ref 150–400)
RBC: 4.94 MIL/uL (ref 4.22–5.81)
RDW: 14 % (ref 11.5–15.5)
WBC: 10 10*3/uL (ref 4.0–10.5)
nRBC: 0 % (ref 0.0–0.2)

## 2019-09-25 MED ORDER — CIPROFLOXACIN HCL 250 MG PO TABS
250.0000 mg | ORAL_TABLET | Freq: Two times a day (BID) | ORAL | Status: DC
  Administered 2019-09-25 – 2019-09-26 (×3): 250 mg via ORAL
  Filled 2019-09-25 (×3): qty 1

## 2019-09-25 NOTE — Progress Notes (Signed)
Pt able to place self on home CPAP w/out difficulty. RT offered assistance pt states he is fine. Pt respiratory status is stable at this time on CPAP. RT will continue to monitor.

## 2019-09-25 NOTE — Progress Notes (Signed)
[place] PHYSICAL MEDICINE & REHABILITATION PROGRESS NOTE   Subjective/Complaints: Says he's bowels are moving frequently. Indicates it's loose. RN reports only regular formed stools. No diarrhea. Stomach a little upset  ROS: Patient denies fever, rash, sore throat, blurred vision,  vomiting,  cough, shortness of breath or chest pain, joint or back pain, headache, or mood change.    Objective:   No results found. Recent Labs    09/23/19 0516 09/25/19 0659  WBC 12.1* 10.0  HGB 14.2 14.3  HCT 43.7 44.5  PLT 387 432*   Recent Labs    09/23/19 0516  NA 136  K 4.0  CL 101  CO2 23  GLUCOSE 103*  BUN 15  CREATININE 0.80  CALCIUM 8.6*    Intake/Output Summary (Last 24 hours) at 09/25/2019 1103 Last data filed at 09/25/2019 0800 Gross per 24 hour  Intake 360 ml  Output 350 ml  Net 10 ml     Physical Exam: Vital Signs Blood pressure 126/82, pulse 71, temperature 97.7 F (36.5 C), temperature source Oral, resp. rate 18, SpO2 96 %. Constitutional: No distress . Vital signs reviewed. HEENT: EOMI, oral membranes moist Neck: supple Cardiovascular: RRR without murmur. No JVD    Respiratory: CTA Bilaterally without wheezes or rales. Normal effort    GI: BS +, non-tender, non-distended   Genitourinary: foley out  Musculoskeletal:  General: No edema.  Neurological: Fair insight. Followed commands. Processing a little better.   UE 5/5. LE: 0/5 bilateral LE's. DTR's 3+ bilateral LE with 2-3 beats clonus. Ongoing hip flexor tightness LLE. Minimal sensation to LT, Pain in LE's. No changes in motor Skin: Skin iswarm. He isnot diaphoretic. Noerythema.  Psychiatric: Flat but cooperative    Assessment/Plan: 1. Functional deficits secondary to encephalopathy d/t urosepsis and AKI which require 3+ hours per day of interdisciplinary therapy in a comprehensive inpatient rehab setting.  Physiatrist is providing close team supervision and 24 hour management of  active medical problems listed below.  Physiatrist and rehab team continue to assess barriers to discharge/monitor patient progress toward functional and medical goals  Care Tool:  Bathing    Body parts bathed by patient: Right arm, Left arm, Chest, Abdomen, Front perineal area, Right upper leg, Buttocks, Left upper leg, Right lower leg, Left lower leg, Face         Bathing assist Assist Level: Minimal Assistance - Patient > 75%     Upper Body Dressing/Undressing Upper body dressing   What is the patient wearing?: Pull over shirt    Upper body assist Assist Level: Supervision/Verbal cueing    Lower Body Dressing/Undressing Lower body dressing      What is the patient wearing?: Pants, Incontinence brief     Lower body assist Assist for lower body dressing: Maximal Assistance - Patient 25 - 49%     Toileting Toileting    Toileting assist Assist for toileting: Total Assistance - Patient < 25%     Transfers Chair/bed transfer  Transfers assist     Chair/bed transfer assist level: Minimal Assistance - Patient > 75%     Locomotion Ambulation   Ambulation assist   Ambulation activity did not occur: N/A          Walk 10 feet activity   Assist  Walk 10 feet activity did not occur: N/A        Walk 50 feet activity   Assist Walk 50 feet with 2 turns activity did not occur: N/A  Walk 150 feet activity   Assist Walk 150 feet activity did not occur: N/A         Walk 10 feet on uneven surface  activity   Assist Walk 10 feet on uneven surfaces activity did not occur: N/A         Wheelchair     Assist Will patient use wheelchair at discharge?: Yes Type of Wheelchair: Manual    Wheelchair assist level: Supervision/Verbal cueing Max wheelchair distance: 59'    Wheelchair 50 feet with 2 turns activity    Assist        Assist Level: Supervision/Verbal cueing   Wheelchair 150 feet activity     Assist       Assist Level: Supervision/Verbal cueing   Blood pressure 126/82, pulse 71, temperature 97.7 F (36.5 C), temperature source Oral, resp. rate 18, SpO2 96 %.  Medical Problem List and Plan: 1.Functional deficitssecondary to encephalopathy d/t urosepsis, AKI in setting of T-10 myelopathy Continue CIR therapies today including PT and OT  2. Antithrombotics: -DVT/anticoagulation:Pharmaceutical:Lovenox -antiplatelet therapy: N/A 3. Pain Management:Tylenol prn 4. Mood:LCSW to follow for evaluation and support. -antipsychotic agents: N/a 5. Neuropsych: This patientis not fullycapable of making decisions on hisown behalf. -He's not too far from baseline at this point 6. Skin/Wound Care:Routine pressure relief measures. 7. Fluids/Electrolytes/Nutrition: good appetite!     8. Acute renal failure: has resolved 26/1.43-->15/0.91.  9. Klebsiella UTI: ceftriaxone completed  -apparently a lot of sediment and "pus" when foley was removed   -wbc's 12.1 11/17 (increased from 8)--> back to 10 11/19  -ucx with 20k pseudomonas  -changed bactrim to cipro x 3 days  10. Seizures: Felt to be secondary to baclofen toxicity urosepsis--continue Keppra 500 mg bid for now.    -don't believe cipro will put him at any future risk given etiology of seizures  -question need to continue keppra long term anyway. Will d/w neurology 11. Anxiety disorder: On ativan 1 mg IV every 6 hours currently--> d/c and transition to Klonopin 1 mg bid. 10. Paraplegia with spastic hemiplegia: OnTizanidine 4 mg qid, baclofen 20 mg ac/hs -received botox to left hip flexors, hip adductors for persistent tone LLE about 2 weeks ago.  Was experiencing improvement until developing the UTI -monitor for now 11. HTN: Monitor BP bid--off cozaar at this time. 12.Neurogenic bowel and bladder:remove foley and begin voiding trial. He was  voiding continently at home per wife. I question whether he really needed to be cathed for larger PVR's given UTI --foley out -improving urinary continence, pvr's low -has had multiple formed incontinent stools (likely d/t not moving bowels for several days leading up to admission here. Nausea should improve as he's completely emptied  -need to get back on daily dig stim program. 13. Severe OSA: Continue CPAP whennapping/sleeping.    LOS: 3 days A FACE TO [place] 09/25/2019, 11:03 AM

## 2019-09-25 NOTE — Progress Notes (Signed)
Pt remains asleep since approx 1030, on cpap. +BM and urination prior

## 2019-09-25 NOTE — Progress Notes (Signed)
Pt now awake, slept throughout night without issue on CPAP. States is feeling much better this am after resting

## 2019-09-25 NOTE — IPOC Note (Signed)
Overall Plan of Care [facility]) Patient Details Name: COPELAN WALDREN MRN: [id number] DOB: [birth year]  Admitting Diagnosis: Physical debility  Hospital Problems: Principal Problem:   Physical debility     Functional Problem List: Nursing Bowel, Safety, Edema, Sensory  PT Balance, Behavior, Endurance, Motor, Safety, Sensory  OT Balance, Sensory, Cognition, Endurance, Motor, Safety  SLP Cognition  TR         Basic ADL's: OT Grooming, Bathing, Dressing     Advanced  ADL's: OT       Transfers: PT Bed Mobility, Bed to Chair, Car  OT Other (comment)(to w/c for completion of ADL)     Locomotion: PT Wheelchair Mobility     Additional Impairments: OT None  SLP Social Cognition   Attention, Memory, Problem Solving, Awareness  TR      Anticipated Outcomes Item Anticipated Outcome  Self Feeding No goal  Swallowing      Basic self-care  mod A  Toileting  Mod A   Bathroom Transfers No Goal  Bowel/Bladder  Patient has chronic foley. Patient will improve towards continence of bowel.  Transfers  mod assist  Locomotion  mod/i w/c mobility in household environment  Communication     Cognition  Supervision A  Pain  Patient will maintain level less than 3 of pain on a scale of 1 to 10.  Safety/Judgment  Patient will adhere to all safety precautions.   Therapy Plan: PT Intensity: Minimum of 1-2 x/day ,45 to 90 minutes PT Frequency: 5 out of 7 days PT Duration Estimated Length of Stay: 5-7 days OT Intensity: Minimum of 1-2 x/day, 45 to 90 minutes OT Frequency: 5 out of 7 days OT Duration/Estimated Length of Stay: 7 days SLP Intensity: Minumum of 1-2 x/day, 30 to 90 minutes SLP Frequency: 3 to 5 out of 7 days SLP Duration/Estimated Length of Stay: 5-7 days   Due to the current state of emergency, patients may not be receiving their 3-hours of Medicare-mandated therapy.   Team Interventions: Nursing Interventions Patient/Family Education, Bowel Management,  Dysphagia/Aspiration Precaution Training, Discharge Planning  PT interventions Discharge planning, Functional mobility training, Psychosocial support, Therapeutic Activities, Therapeutic Exercise, Wheelchair propulsion/positioning, Skin care/wound management, Disease management/prevention, Neuromuscular re-education, Balance/vestibular training, DME/adaptive equipment instruction, Pain management, Splinting/orthotics, UE/LE Strength taining/ROM, UE/LE Coordination activities, Patient/family education, Community reintegration  OT Interventions Training and development officer, Therapeutic Exercise, Wheelchair propulsion/positioning, UE/LE Strength taining/ROM, Skin care/wound managment, Cognitive remediation/compensation, Patient/family education, Community reintegration, UE/LE Coordination activities, Psychosocial support, Therapeutic Activities, Discharge planning, Functional mobility training, Self Care/advanced ADL retraining  SLP Interventions Cognitive remediation/compensation, Cueing hierarchy, Functional tasks, Internal/external aids, Medication managment, Patient/family education  TR Interventions    SW/CM Interventions Discharge Planning, Psychosocial Support, Patient/Family Education   Barriers to Discharge MD  Medical stability  Nursing      PT Incontinence    OT Other (comments)(N/A)    SLP      SW       Team Discharge Planning: Destination: PT-Home ,OT- Home , SLP-Home Projected Follow-up: PT-Home health PT, OT-  Home health OT, SLP-24 hour supervision/assistance, Home Health SLP, Outpatient SLP Projected Equipment Needs: PT-To be determined, OT- To be determined, SLP-None recommended by SLP Equipment Details: PT-Has manual w/c, power w/c, slide board, and hospital bed, OT-  Patient/family involved in discharge planning: PT- Patient,  OT-Patient, Family member/caregiver, SLP-Patient, Family member/caregiver  MD ELOS: 5-7 days Medical Rehab Prognosis:  Excellent Assessment: The  patient has been admitted for CIR therapies with the diagnosis of debility/encephalopathy. The team will be  addressing functional mobility, strength, stamina, balance, safety, adaptive techniques and equipment, self-care, bowel and bladder mgt, patient and caregiver education, NMR, spasticity rx, cognition, community reentry, w/c assessment. Goals have been set at mod assist for basic self-care and transfers, supervision with cognition and mod I with w/c locomotion.   Due to the current state of emergency, patients may not be receiving their 3 hours per day of Medicare-mandated therapy.    Meredith Staggers, MD, FAAPMR      See Team Conference Notes for weekly updates to the plan of care

## 2019-09-25 NOTE — Progress Notes (Signed)
Physical Therapy Session Note  Patient Details  Name: WHITTEN ARNEY MRN: [id number] Date of Birth: 06/01/1946  Today's Date: 09/25/2019 PT Individual Time: 1300-1345 AND 1510-1520 PT Individual Time Calculation (min): 45 min AND 10 min   Short Term Goals: Week 1:  PT Short Term Goal 1 (Week 1): =LTGs due to ELOS  Skilled Therapeutic Interventions/Progress Updates:   Session 1: Pt in w/c and agreeable to therapy, denies pain. Pt self-propelled w/c around unit w/ supervision using BUEs. Worked on functional transfers this session. Transferred to/from mat w/ min assist overall w/ verbal and tactile cues for anterior trunk lean, head/hips relationship, and BUE placement. Overall improved technique since last session w/ this therapist. Practiced car transfer w/ wife present to instruct how they typically perform this. Needed mod assist for transfer to car, min assist transfer out of car w/ wife helping on 2nd transfer and providing appropriate cues and assist. Returned to room and pt's pants noted to be soiled of urine. R/L rolling in supine w/ supervision while therapist performed pericare and brief management. Ended session in supine, all needs in reach.   Session 2:  Pt in supine and agreeable to therapy, no c/o pain. R/L rolling w/ supervision to don shorts. Supine>sit w/ mod assist. Min-mod assist slide board transfer to w/c w/ verbal and tactile cues for technique and anterior weight shifting. Ended session in w/c and all needs in reach.   Therapy Documentation Precautions:  Precautions Precautions: Fall Precaution Comments: spastic paraplegia Restrictions Weight Bearing Restrictions: No  Therapy/Group: Individual Therapy  Joban Colledge K Chrsitopher Wik 09/25/2019, 1:50 PM

## 2019-09-25 NOTE — Progress Notes (Signed)
Occupational Therapy Session Note  Patient Details  Name: David Kim MRN: [id number] Date of Birth: 31-Jan-1946  Today's Date: 09/25/2019 OT Individual Time: 0940-1050 OT Individual Time Calculation (min): 70 min    Short Term Goals: Week 1:  OT Short Term Goal 1 (Week 1): STG = LTG d/t ELOS  Skilled Therapeutic Interventions/Progress Updates:    Pt seen for OT session focusing on ADL re-training. Pt in supine upon arrival with NT assisting following incontinent BM. Pants threaded on total A, he rolled with min A using hospital bed functions and able to assist with pulling pants up.  He transferred to sitting EOB with min A using hospital bed functions. Close supervision static and dynamic sitting balance EOB. Completed min-mod A sliding board transfer to w/c with VCs for head hip relationship.  Completed UB bathing from w/c level at sink with set-up and supervision for dynamic sitting balnace in w/c. Shirt donned with assist to pull down in back. Oral care completed from w/c level at sink, pt's wife assisting with set-up. VCs for attention throughout task. He self-propelled w/c throughout unit with supervision. Hands on family training with pt's wife for sliding board transfers. Emphasis on head/hip relationship and anterior weightshift during transfer. Completed overall min A for balance and management of LEs. Pt's wife will benefit from cont hands on training.  Pt returned to room at end of session, left seated with all needs in reach and wife present.   Therapy Documentation Precautions:  Precautions Precautions: Fall Precaution Comments: spastic paraplegia Restrictions Weight Bearing Restrictions: No   Therapy/Group: Individual Therapy  Jaelyn Bourgoin L 09/25/2019, 7:05 AM

## 2019-09-25 NOTE — Plan of Care (Signed)
  Problem: Consults Goal: RH GENERAL PATIENT EDUCATION Description: See Patient Education module for education specifics. Outcome: Progressing   Problem: RH BOWEL ELIMINATION Goal: RH STG MANAGE BOWEL WITH ASSISTANCE Description: STG Manage Bowel with  mod Assistance. Outcome: Progressing Goal: RH STG MANAGE BOWEL W/MEDICATION W/ASSISTANCE Description: STG Manage Bowel with Medication with mod Assistance. Outcome: Progressing   Problem: RH SKIN INTEGRITY Goal: RH STG SKIN FREE OF INFECTION/BREAKDOWN Description: Min assist Outcome: Progressing Goal: RH STG MAINTAIN SKIN INTEGRITY WITH ASSISTANCE Description: STG Maintain Skin Integrity With min Assistance. Outcome: Progressing   Problem: RH SAFETY Goal: RH STG ADHERE TO SAFETY PRECAUTIONS W/ASSISTANCE/DEVICE Description: STG Adhere to Safety Precautions With mod I Assistance/Device. Outcome: Progressing   Problem: RH PAIN MANAGEMENT Goal: RH STG PAIN MANAGED AT OR BELOW PT'S PAIN GOAL Description: 4 or less Outcome: Progressing   Problem: RH KNOWLEDGE DEFICIT GENERAL Goal: RH STG INCREASE KNOWLEDGE OF SELF CARE AFTER HOSPITALIZATION Description: Pt will be able to adhere to bowel regimen with min assist from significant other upon discharge.  Outcome: Progressing

## 2019-09-25 NOTE — Progress Notes (Signed)
Speech Language Pathology Daily Session Note  Patient Details  Name: David Kim MRN: [id number] Date of Birth: 01-22-1946  Today's Date: 09/25/2019 SLP Individual Time: [id number] SLP Individual Time Calculation (min): 55 min  Short Term Goals: Week 1: SLP Short Term Goal 1 (Week 1): STG=LTG due to short ELOS  Skilled Therapeutic Interventions: Skilled treatment session focused on cognitive goals. SLP facilitated session by providing more than a reasonable amount of time and overall supervision level verbal cues for organization and problem solving during a complex money management task. Patient left upright in bed with alarm on and all needs within reach. Continue with current plan of care.      Pain Pain Assessment Pain Scale: 0-10 Pain Score: 0-No pain  Therapy/Group: Individual Therapy  Tonique Mendonca 09/25/2019, 12:07 PM

## 2019-09-26 ENCOUNTER — Inpatient Hospital Stay (HOSPITAL_COMMUNITY): Payer: Medicare Other | Admitting: Physical Therapy

## 2019-09-26 ENCOUNTER — Inpatient Hospital Stay (HOSPITAL_COMMUNITY): Payer: Medicare Other | Admitting: Occupational Therapy

## 2019-09-26 ENCOUNTER — Inpatient Hospital Stay (HOSPITAL_COMMUNITY): Payer: Medicare Other | Admitting: Speech Pathology

## 2019-09-26 NOTE — Progress Notes (Signed)
Speech Language Pathology Daily Session Note  Patient Details  Name: David Kim MRN: [id number] Date of Birth: 20-Dec-1945  Today's Date: 09/26/2019 SLP Individual Time: 1355-1450 SLP Individual Time Calculation (min): 55 min  Short Term Goals: Week 1: SLP Short Term Goal 1 (Week 1): STG=LTG due to short ELOS  Skilled Therapeutic Interventions: Skilled treatment session focused on cognitive goals. SLP facilitated session by providing extra time and overall supervision level verbal cues for complex problem solving during a medication management task in which the patient had to organize a QD pill box. Patient's wife present and asking questions in regards to current medications vs home medications and was told to direct her questions to the physician. She verbalized understanding. Patient divided is attention between task and conversation with overall Mod I. Patient left upright in the wheelchair with all needs within reach and wife present. Continue with current plan of care.      Pain No/Denies Pain   Therapy/Group: Individual Therapy  Magon Croson 09/26/2019, 2:54 PM

## 2019-09-26 NOTE — Progress Notes (Signed)
Physical Therapy Session Note  Patient Details  Name: David Kim MRN: [id number] Date of Birth: 1946-03-24  Today's Date: 09/26/2019 PT Individual Time: 1115-1145 AND 1245-1325 PT Individual Time Calculation (min): 30 min AND 40 min  Short Term Goals: Week 1:  PT Short Term Goal 1 (Week 1): =LTGs due to ELOS  Skilled Therapeutic Interventions/Progress Updates:   Session 1:  Pt in w/c and agreeable to therapy, denies pain. Had wife provide hands on for slide board transfer to/from bed. Pt safely performed transfer w/ wife providing verbal and tactile cues for technique w/ CGA and increased time. Much improved head/hips relationship and anterior weight shifting this session. Worked on w/c mobility by weaving through cones both forwards and backwards w/ supervision and min assist for ramp negotiation. Pt self-propelled w/c back to room and ended session in w/c, all needs in reach.   Session 2:  Pt in w/c and agreeable to therapy, no c/o pain. Pt self-propelled w/c to/from therapy gym w/ supervision using BUEs to work on functional endurance. Pt needed multiple cues to attend to task, appears to have difficulty w/ dual task, even self-propelling w/c and talking. Arm ergometer 5 min x2 to work on endurance and BUE strengthening. Pt discussed his home workout regimen w/ weights to work on arm strengthening. Per wife, he pushes himself around the house. Encouraged pt to keep performing as much as he can safely do independently to maintain trunk and UE strength. Returned to room and ended session in w/c and all needs in reach.   Therapy Documentation Precautions:  Precautions Precautions: Fall Precaution Comments: spastic paraplegia Restrictions Weight Bearing Restrictions: No Pain: Pain Assessment Pain Scale: 0-10 Pain Score: 0-No pain  Therapy/Group: Individual Therapy  Jaleen Finch K Braidon Chermak 09/26/2019, 12:06 PM

## 2019-09-26 NOTE — Progress Notes (Signed)
Occupational Therapy Session Note  Patient Details  Name: RAINE ALWARDT MRN: [id number] Date of Birth: 1946/09/16  Today's Date: 09/26/2019 OT Individual Time: [id number] OT Individual Time Calculation (min): 72 min    Short Term Goals: Week 1:  OT Short Term Goal 1 (Week 1): STG = LTG d/t ELOS  Skilled Therapeutic Interventions/Progress Updates:    Pt completed bathing and dressing bed level during session.  HOB was elevated and he was able to complete UB bathing with setup assist in supported position.  He was able to complete LB bathing with overall mod assist, requiring therapist assist to clean his buttocks from bowel incontinence episode as well as for donning new brief.  He was able to wash his lower legs and feet in long sitting.  Pt's spouse present throughout session and she reports assisting him some at home with bathing his buttocks as well as donning new brief.  He needed total assist for donning pull up shorts with min assist for rolling side to side when pulling them over his hips.  He transitioned to sitting with mod assist on the right side of the bed and completed wheelchair transfer with use of the sliding board, increased time, and min facilitation.  He needed min demonstrational cueing for head/hip relationship as he demonstrated no significant weightshift to the right when scooting across the board.  Finished session with completion of oral hygiene at the sink.  Pt left in the wheelchair with call button and phone in reach and spouse present.    Therapy Documentation Precautions:  Precautions Precautions: Fall Precaution Comments: spastic paraplegia Restrictions Weight Bearing Restrictions: No Pain: Pain Assessment Pain Scale: Faces Pain Score: 0-No pain ADL: See Care Tool Section for some details of mobility and selfcare  Therapy/Group: Individual Therapy  Ahmyah Gidley OTR/L 09/26/2019, 12:10 PM

## 2019-09-26 NOTE — Progress Notes (Signed)
[place] PHYSICAL MEDICINE & REHABILITATION PROGRESS NOTE   Subjective/Complaints: Feeling better today. Still having regular, soft stools. Denies pain. Left leg still tight  ROS: Patient denies fever, rash, sore throat, blurred vision, nausea, vomiting, diarrhea, cough, shortness of breath or chest pain, joint or back pain, headache, or mood change.    Objective:   No results found. Recent Labs    09/25/19 0659  WBC 10.0  HGB 14.3  HCT 44.5  PLT 432*   No results for input(s): NA, K, CL, CO2, GLUCOSE, BUN, CREATININE, CALCIUM in the last 72 hours.  Intake/Output Summary (Last 24 hours) at 09/26/2019 1023 Last data filed at 09/26/2019 0720 Gross per 24 hour  Intake 840 ml  Output 450 ml  Net 390 ml     Physical Exam: Vital Signs Blood pressure 130/69, pulse 72, temperature 98.3 F (36.8 C), temperature source Oral, resp. rate 16, SpO2 96 %. Constitutional: No distress . Vital signs reviewed. HEENT: EOMI, oral membranes moist Neck: supple Cardiovascular: RRR without murmur. No JVD    Respiratory: CTA Bilaterally without wheezes or rales. Normal effort    GI: BS +, non-tender, non-distended    Musculoskeletal:  General: No edema.  Neurological: Fair insight. Followed commands. Processing a little better.   UE 5/5. LE: 0/5 bilateral LE's. DTR's 3+ bilateral LE with 2-3 beats clonus. LLE: ongoing hip flexor tightness, hamstring, hip adductor tightness. Can be stretched fairly easily.  Minimal sensation to LT, Pain in LE's. No changes in motor Skin: Skin iswarm. He isnot diaphoretic. Noerythema.  Psychiatric: Flat but cooperative    Assessment/Plan: 1. Functional deficits secondary to encephalopathy d/t urosepsis and AKI which require 3+ hours per day of interdisciplinary therapy in a comprehensive inpatient rehab setting.  Physiatrist is providing close team supervision and 24 hour management of active medical problems listed below.  Physiatrist and  rehab team continue to assess barriers to discharge/monitor patient progress toward functional and medical goals  Care Tool:  Bathing    Body parts bathed by patient: Right arm, Left arm, Chest, Abdomen, Front perineal area, Right upper leg, Buttocks, Left upper leg, Right lower leg, Left lower leg, Face         Bathing assist Assist Level: Minimal Assistance - Patient > 75%     Upper Body Dressing/Undressing Upper body dressing   What is the patient wearing?: Pull over shirt    Upper body assist Assist Level: Minimal Assistance - Patient > 75%    Lower Body Dressing/Undressing Lower body dressing      What is the patient wearing?: Incontinence brief, Pants     Lower body assist Assist for lower body dressing: Total Assistance - Patient < 25%     Toileting Toileting    Toileting assist Assist for toileting: Maximal Assistance - Patient 25 - 49%     Transfers Chair/bed transfer  Transfers assist     Chair/bed transfer assist level: 2 Helpers(2 w/slideboard )     Locomotion Ambulation   Ambulation assist   Ambulation activity did not occur: N/A          Walk 10 feet activity   Assist  Walk 10 feet activity did not occur: N/A        Walk 50 feet activity   Assist Walk 50 feet with 2 turns activity did not occur: N/A         Walk 150 feet activity   Assist Walk 150 feet activity did not occur: N/A  Walk 10 feet on uneven surface  activity   Assist Walk 10 feet on uneven surfaces activity did not occur: N/A         Wheelchair     Assist Will patient use wheelchair at discharge?: Yes Type of Wheelchair: Manual    Wheelchair assist level: Supervision/Verbal cueing Max wheelchair distance: 150'    Wheelchair 50 feet with 2 turns activity    Assist        Assist Level: Supervision/Verbal cueing   Wheelchair 150 feet activity     Assist      Assist Level: Supervision/Verbal cueing   Blood  pressure 130/69, pulse 72, temperature 98.3 F (36.8 C), temperature source Oral, resp. rate 16, SpO2 96 %.  Medical Problem List and Plan: 1.Functional deficitssecondary to encephalopathy d/t urosepsis, AKI in setting of T-10 myelopathy Continue CIR therapies today including PT and OT  2. Antithrombotics: -DVT/anticoagulation:Pharmaceutical:Lovenox -antiplatelet therapy: N/A 3. Pain Management:Tylenol prn 4. Mood:LCSW to follow for evaluation and support. -antipsychotic agents: N/a 5. Neuropsych: This patientis not fullycapable of making decisions on hisown behalf. -He's not too far from baseline at this point 6. Skin/Wound Care:Routine pressure relief measures. 7. Fluids/Electrolytes/Nutrition: good appetite!     8. Acute renal failure: has resolved 26/1.43-->15/0.91.  9. Klebsiella UTI: ceftriaxone completed  -apparently a lot of sediment and "pus" when foley was removed   -wbc's 12.1 11/17 (increased from 8)--> back to 10 11/19  -ucx with 20k pseudomonas  -will halt cipro given it's potentiation of tizanidine's effects--appreciate pharmacy assistance. He's gotten 3 doses  10. Seizures: Felt to be secondary to baclofen toxicity urosepsis--continue Keppra 500 mg bid for now.    -question need to continue keppra long term anyway. Have reached out to neurology. 11. Anxiety disorder/tone:  Klonopin 1 mg bid per baseline 10. Paraplegia with spastic hemiplegia: OnTizanidine 4 mg qid, baclofen 20 mg ac/hs -received botox to left hip flexors, hip adductors for persistent tone LLE about 2 weeks ago.   Was experiencing improvement until developing the UTI -monitor for now 11. HTN: Monitor BP bid--off cozaar at this time. 12.Neurogenic bowel and bladder:remove foley and begin voiding trial. He was voiding continently at home per wife. I question whether he really needed to be cathed for  larger PVR's given UTI --foley out -improved urinary continence, pvr's low -still having incontinent stools  -need to get back on daily dig stim program. 13. Severe OSA: Continue CPAP whennapping/sleeping.    LOS: 4 days A FACE TO FACE EVALUATION WAS PERFORMED  Meredith Staggers 09/26/2019, 10:23 AM

## 2019-09-27 ENCOUNTER — Inpatient Hospital Stay (HOSPITAL_COMMUNITY): Payer: Medicare Other | Admitting: Speech Pathology

## 2019-09-27 ENCOUNTER — Inpatient Hospital Stay (HOSPITAL_COMMUNITY): Payer: Medicare Other | Admitting: Occupational Therapy

## 2019-09-27 ENCOUNTER — Inpatient Hospital Stay (HOSPITAL_COMMUNITY): Payer: Medicare Other | Admitting: Physical Therapy

## 2019-09-27 NOTE — Progress Notes (Signed)
Physical Therapy Session Note  Patient Details  Name: David Kim MRN: [id number] Date of Birth: [birth year]  Today's Date: 09/27/2019 PT Individual Time: 1105-1201 PT Individual Time Calculation (min): 56 min   Short Term Goals: Week 1:  PT Short Term Goal 1 (Week 1): =LTGs due to ELOS  Skilled Therapeutic Interventions/Progress Updates:  Pt received in w/c in handoff from OT & completing grooming tasks a the sink. Pt's wife [name]) present for session. PT placed dressing over L distal lateral leg abrasion that OT reports nurse is already aware of. Pt propels w/c room<>ortho gym with BUE & supervision. Pt & wife complete slide board transfer w/c<>car with pt requiring min assist with wife appropriately cuing & assisting pt. Therapist educated pt on increased ease of transfer with proper head/hips relationship with pt demonstrating improvement in ability when returning to w/c. Pt negotiates ramp with supervision (min assist to negotiate metal threshold in ortho gym) with therapist educating him on need for anterior weight shifting when ascending ramp & wife providing supervision behind him. Pt utilized BUE ergometer up to level 7 but reduced to level 6 x 5 minutes forwards + 5 minutes backwards with task focusing on BUE strengthening & cardiopulmonary endurance training. Therapist adjusted gait belt around BLE to prevent BLE abduction (LLE>RLE) with therapist educating pt & wife on needing to perform skin checks 2/2 pt's absent sensation and snug belt. At end of session pt left in w/c with seat belt donned & wife present to supervise.   Therapy Documentation Precautions:  Precautions Precautions: Fall Precaution Comments: spastic paraplegia Restrictions Weight Bearing Restrictions: No  Pain: No c/o pain reported during session.   Therapy/Group: Individual Therapy  [name] 09/27/2019, 12:07 PM

## 2019-09-27 NOTE — Progress Notes (Signed)
Occupational Therapy Session Note  Patient Details  Name: David Kim MRN: [id number] Date of Birth: [birth year]  Today's Date: 09/27/2019 OT Individual Time: 1003-1105 OT Individual Time Calculation (min): 62 min   Short Term Goals: Week 1:  OT Short Term Goal 1 (Week 1): STG = LTG d/t ELOS  Skilled Therapeutic Interventions/Progress Updates:    Pt greeted in bed with no c/o pain. [name] was present, setting up to assist him with perihygiene. Vcs provided to elevate height of bed for improved body mechanics of caregiver. Min vcs for assisting pt safely with rolling Rt>Lt using bedrails. He had a large incontinent BM in brief and required increased time for hygiene to be completed. Spouse assisted pt with pericare at home PTA. We discussed pressure relief positioning in bed to decrease risk of skin breakdown at home. Pt then resumed with UB/LB bathing and dressing tasks. UB self care completed with setup and HOB elevated. He was able to wash feet and thread LEs into pants while in unsupported long sitting. He also donned his gripper socks using this method. Min A to elevate pants fully while rolling Rt>Lt using the bedrails. Supine<sit from flat bed completed with supervision assist. David Kim assisted pt with slideboard transfer with min vcs for board placement. Pt required vcs for head/hip relationship during transfer, which he completed with steady assist. Pt propelled w/c over to his dresser to gather needed items for oral care and then completed oral care while parked at the sink. Used a gait belt to prevent abduction of hips while seated. Per spouse, they use this technique at home. Pt was left via PT handoff.   Therapy Documentation Precautions:  Precautions Precautions: Fall Precaution Comments: spastic paraplegia Restrictions Weight Bearing Restrictions: No Vital Signs: Therapy Vitals Temp: 98.6 F (37 C) Pulse Rate: 89 Resp: 18 BP: 124/63 Patient Position (if  appropriate): Sitting Oxygen Therapy SpO2: 97 % O2 Device: Room Air Pain: Pain Assessment Pain Scale: 0-10 Pain Score: 0-No pain ADL: ADL Eating: Set up Grooming: Minimal assistance Upper Body Bathing: Setup Where Assessed-Upper Body Bathing: Bed level Lower Body Bathing: Minimal assistance Where Assessed-Lower Body Bathing: Bed level Upper Body Dressing: Setup Where Assessed-Upper Body Dressing: Bed level Lower Body Dressing: Maximal assistance(thread B LE into short and A needed for rolling R and L to pull over buttocks) Where Assessed-Lower Body Dressing: Bed level Toileting: Maximal assistance Where Assessed-Toileting: Bed level Toilet Transfer: Unable to assess Toilet Transfer Method: Unable to assess Tub/Shower Transfer: Unable to assess(Wife report pt does not shower in bathroom; bed level baths only) Tub/Shower Transfer Method: Unable to assess Social research officer, government: Unable to assess Social research officer, government Method: Unable to assess     Therapy/Group: Individual Therapy  Jeray Shugart A Aijalon Kirtz 09/27/2019, 4:13 PM

## 2019-09-27 NOTE — Plan of Care (Signed)
  Problem: Consults Goal: RH GENERAL PATIENT EDUCATION Description: See Patient Education module for education specifics. Outcome: Progressing   Problem: RH BOWEL ELIMINATION Goal: RH STG MANAGE BOWEL WITH ASSISTANCE Description: STG Manage Bowel with  mod Assistance. Outcome: Progressing Goal: RH STG MANAGE BOWEL W/MEDICATION W/ASSISTANCE Description: STG Manage Bowel with Medication with mod Assistance. Outcome: Progressing   Problem: RH SKIN INTEGRITY Goal: RH STG SKIN FREE OF INFECTION/BREAKDOWN Description: Min assist Outcome: Progressing Goal: RH STG MAINTAIN SKIN INTEGRITY WITH ASSISTANCE Description: STG Maintain Skin Integrity With min Assistance. Outcome: Progressing   Problem: RH SAFETY Goal: RH STG ADHERE TO SAFETY PRECAUTIONS W/ASSISTANCE/DEVICE Description: STG Adhere to Safety Precautions With mod I Assistance/Device. Outcome: Progressing   Problem: RH PAIN MANAGEMENT Goal: RH STG PAIN MANAGED AT OR BELOW PT'S PAIN GOAL Description: 4 or less Outcome: Progressing   Problem: RH KNOWLEDGE DEFICIT GENERAL Goal: RH STG INCREASE KNOWLEDGE OF SELF CARE AFTER HOSPITALIZATION Description: Pt will be able to adhere to bowel regimen with min assist from significant other upon discharge.  Outcome: Progressing

## 2019-09-27 NOTE — Progress Notes (Signed)
[place] PHYSICAL MEDICINE & REHABILITATION PROGRESS NOTE   Subjective/Complaints: No new complaints. Had two soft/loose bm's overnight.   ROS: Patient denies fever, rash, sore throat, blurred vision, nausea, vomiting,  cough, shortness of breath or chest pain, joint or back pain, headache, or mood change.   Objective:   No results found. Recent Labs    09/25/19 0659  WBC 10.0  HGB 14.3  HCT 44.5  PLT 432*   No results for input(s): NA, K, CL, CO2, GLUCOSE, BUN, CREATININE, CALCIUM in the last 72 hours.  Intake/Output Summary (Last 24 hours) at 09/27/2019 1058 Last data filed at 09/27/2019 0943 Gross per 24 hour  Intake 896 ml  Output 1850 ml  Net -954 ml     Physical Exam: Vital Signs Blood pressure 126/67, pulse 70, temperature 98.8 F (37.1 C), temperature source Oral, resp. rate 16, SpO2 96 %. Constitutional: No distress . Vital signs reviewed. HEENT: EOMI, oral membranes moist Neck: supple Cardiovascular: RRR without murmur. No JVD    Respiratory: CTA Bilaterally without wheezes or rales. Normal effort    GI: BS +, non-tender, non-distended  Musculoskeletal:  General: No edema.  Neurological: Fair insight. Followed commands. Processing a little better.   UE 5/5. LE: 0/5 bilateral LE's. DTR's 3+ bilateral LE with 2-3 beats clonus. LLE: ongoing hip flexor tightness, hamstring, hip adductor tightness. Can be stretched fairly easily.  Minimal sensation to LT, Pain in LE's. No motor or tone changes today Skin: Skin iswarm. He isnot diaphoretic. Noerythema.  Psychiatric: Flat but cooperative, more interactive    Assessment/Plan: 1. Functional deficits secondary to encephalopathy d/t urosepsis and AKI which require 3+ hours per day of interdisciplinary therapy in a comprehensive inpatient rehab setting.  Physiatrist is providing close team supervision and 24 hour management of active medical problems listed below.  Physiatrist and rehab team  continue to assess barriers to discharge/monitor patient progress toward functional and medical goals  Care Tool:  Bathing    Body parts bathed by patient: Right arm, Left arm, Chest, Abdomen, Right upper leg, Left upper leg, Right lower leg, Left lower leg, Face   Body parts bathed by helper: Front perineal area, Buttocks     Bathing assist Assist Level: Minimal Assistance - Patient > 75%     Upper Body Dressing/Undressing Upper body dressing   What is the patient wearing?: Pull over shirt    Upper body assist Assist Level: Minimal Assistance - Patient > 75%    Lower Body Dressing/Undressing Lower body dressing      What is the patient wearing?: Incontinence brief, Pants     Lower body assist Assist for lower body dressing: Total Assistance - Patient < 25%     Toileting Toileting    Toileting assist Assist for toileting: Maximal Assistance - Patient 25 - 49%     Transfers Chair/bed transfer  Transfers assist     Chair/bed transfer assist level: Minimal Assistance - Patient > 75%(sliding board)     Locomotion Ambulation   Ambulation assist   Ambulation activity did not occur: N/A          Walk 10 feet activity   Assist  Walk 10 feet activity did not occur: N/A        Walk 50 feet activity   Assist Walk 50 feet with 2 turns activity did not occur: N/A         Walk 150 feet activity   Assist Walk 150 feet activity did not occur: N/A  Walk 10 feet on uneven surface  activity   Assist Walk 10 feet on uneven surfaces activity did not occur: N/A         Wheelchair     Assist Will patient use wheelchair at discharge?: Yes Type of Wheelchair: Manual    Wheelchair assist level: Supervision/Verbal cueing Max wheelchair distance: 150'    Wheelchair 50 feet with 2 turns activity    Assist        Assist Level: Supervision/Verbal cueing   Wheelchair 150 feet activity     Assist      Assist Level:  Supervision/Verbal cueing   Blood pressure 126/67, pulse 70, temperature 98.8 F (37.1 C), temperature source Oral, resp. rate 16, SpO2 96 %.  Medical Problem List and Plan: 1.Functional deficitssecondary to encephalopathy d/t urosepsis, AKI in setting of T-10 myelopathy Continue CIR therapies today including PT and OT  2. Antithrombotics: -DVT/anticoagulation:Pharmaceutical:Lovenox -antiplatelet therapy: N/A 3. Pain Management:Tylenol prn 4. Mood:LCSW to follow for evaluation and support. -antipsychotic agents: N/a 5. Neuropsych: This patientis not fullycapable of making decisions on hisown behalf. -He's not too far from baseline at this point 6. Skin/Wound Care:Routine pressure relief measures. 7. Fluids/Electrolytes/Nutrition: good appetite!     8. Acute renal failure: has resolved 26/1.43-->15/0.91.  9. Klebsiella UTI: ceftriaxone completed  -apparently a lot of sediment and "pus" when foley was removed   -wbc's 12.1 11/17 (increased from 8)--> back to 10 11/19  -ucx with 20k pseudomonas  -stopped cipro given it's potentiation of tizanidine's effects--appreciate pharmacy assistance. He received 3 doses in total--observe for any urinary sx  10. Seizures: Felt to be secondary to baclofen toxicity urosepsis--  Keppra 500 mg bid for now.    -question need to continue keppra long term. Have reached out to neurology without response yet. Will check again with neuro on Tuesday when I return. Can continue for now. 11. Anxiety disorder/tone:  Klonopin 1 mg bid per baseline 10. Paraplegia with spastic hemiplegia: OnTizanidine 4 mg qid, baclofen 20 mg ac/hs -received botox to left hip flexors, hip adductors for persistent tone LLE about 2 weeks ago.   Was experiencing improvement until developing the UTI -monitor for now 11. HTN: Monitor BP bid--off cozaar at this time. 12.Neurogenic  bowel and bladder:remove foley and begin voiding trial. He was voiding continently at home per wife. I question whether he really needed to be cathed for larger PVR's given UTI --foley out -improved urinary continence, pvr's low -still having incontinent stools  -need to get back on daily dig stim program. Have discussed with pt/wife 13. Severe OSA: Continue CPAP whennapping/sleeping.    LOS: 5 days A FACE TO FACE EVALUATION WAS PERFORMED  Meredith Staggers 09/27/2019, 10:58 AM

## 2019-09-27 NOTE — Progress Notes (Signed)
Speech Language Pathology Daily Session Note  Patient Details  Name: David Kim MRN: [id number] Date of Birth: 1946-10-30  Today's Date: 09/27/2019 SLP Individual Time: 1230-1300 SLP Individual Time Calculation (min): 30 min  Short Term Goals: Week 1: SLP Short Term Goal 1 (Week 1): STG=LTG due to short ELOS  Skilled Therapeutic Interventions: Upon entry to room patient was upright in chair with spouse present. Patient had just finished his lunch tray. Patient and spouse requested therapy session consist of a repeat clinical swallow evaluation due to patient's dislike for current diet stating "It's mush. I eat regular solids like chilli at home". Per spouse mechanical soft diet was initiated in acute care when patient was more lethargic. Patient demonstrated functional mastication of regular solids with no oral residues noted. No overt s/sx of aspiration were observed with 4oz of thin liquids or regular solid consistency. Will advance patient to a regular diet and thin liquids. Both patient and spouse verbalized understanding and agreement with plan. Patient left upright in chair, all needs within reach, and spouse present.  Pain Pain Assessment Pain Scale: 0-10 Pain Score: 0-No pain  Therapy/Group: Individual Therapy  Cristy Folks 09/27/2019, 1:07 PM

## 2019-09-28 ENCOUNTER — Inpatient Hospital Stay (HOSPITAL_COMMUNITY): Payer: Medicare Other | Admitting: Occupational Therapy

## 2019-09-28 ENCOUNTER — Inpatient Hospital Stay (HOSPITAL_COMMUNITY): Payer: Medicare Other

## 2019-09-28 DIAGNOSIS — R5381 Other malaise: Principal | ICD-10-CM

## 2019-09-28 NOTE — Progress Notes (Signed)
Physical Therapy Session Note  Patient Details  Name: David Kim MRN: [id number] Date of Birth: 02/18/1946  Today's Date: 09/28/2019 PT Individual Time: 0752-0901 PT Individual Time Calculation (min): 69 min   Short Term Goals: Week 1:  PT Short Term Goal 1 (Week 1): =LTGs due to ELOS  Skilled Therapeutic Interventions/Progress Updates:    focused session on functional bed mobility for rolling, supine to long sitting, and functional dynamic sitting balance while performing bathing and dressing bed level. Pt requires min assist to roll with bed rails multiple times for hygiene and brief change (occasional mod assist needed for repositioning of BLE due to increased spasticity) - pt continuing to have BM throughout process so required several attempts to finish cleaning and BM. Pt with continent void with urinal with set up assist. Once brief donned, pt set up in long sitting with HOB elevated and performed bathing, lotion, and dressing at bed level using bed rails for support for balance. Requires set-up to min assist overall for functional tasks. Pt easily distracted throughout session and requires redirection back to task several times. Impaired memory noted. Performed supine to sit EOB with min to mod assist overall and min assist for balance while pt performed placement of slideboard. CGA/min assist for slideboard transfer into w/c and placed belt to prevent abduction of BLE.   Therapy Documentation Precautions:  Precautions Precautions: Fall Precaution Comments: spastic paraplegia Restrictions Weight Bearing Restrictions: No  Pain: Denies pain.  Therapy/Group: Individual Therapy  Canary Brim Ivory Broad, PT, DPT, CBIS  09/28/2019, 9:25 AM

## 2019-09-28 NOTE — Progress Notes (Signed)
[place] PHYSICAL MEDICINE & REHABILITATION PROGRESS NOTE   Subjective/Complaints: No new complaints. Had BM this morning. Wife will be coming in at 10am. Denies pain, increased spasticity.   ROS: Patient denies fever, rash, sore throat, blurred vision, nausea, vomiting,  cough, shortness of breath or chest pain, joint or back pain, headache, or mood change.   Objective:   No results found. No results for input(s): WBC, HGB, HCT, PLT in the last 72 hours. No results for input(s): NA, K, CL, CO2, GLUCOSE, BUN, CREATININE, CALCIUM in the last 72 hours.  Intake/Output Summary (Last 24 hours) at 09/28/2019 1051 Last data filed at 09/28/2019 0800 Gross per 24 hour  Intake 840 ml  Output 1075 ml  Net -235 ml     Physical Exam: Vital Signs Blood pressure 129/70, pulse 70, temperature 98.3 F (36.8 C), temperature source Oral, resp. rate 15, SpO2 98 %. Constitutional: No distress . Vital signs reviewed. HEENT: EOMI, oral membranes moist Neck: supple Cardiovascular: RRR without murmur. No JVD    Respiratory: CTA Bilaterally without wheezes or rales. Normal effort    GI: BS +, non-tender, non-distended  Musculoskeletal:  General: No edema.  Neurological: Fair insight. Followed commands. Processing a little better.   UE 5/5. LE: 0/5 bilateral LE's. DTR's 3+ bilateral LE with 2-3 beats clonus. LLE: ongoing hip flexor tightness, hamstring, hip adductor tightness. Can be stretched fairly easily.  Minimal sensation to LT, Pain in LE's. No motor or tone changes today Skin: Skin iswarm. He isnot diaphoretic. Noerythema.  Psychiatric: Flat but cooperative, more interactive    Assessment/Plan: 1. Functional deficits secondary to encephalopathy d/t urosepsis and AKI which require 3+ hours per day of interdisciplinary therapy in a comprehensive inpatient rehab setting.  Physiatrist is providing close team supervision and 24 hour management of active medical problems listed  below.  Physiatrist and rehab team continue to assess barriers to discharge/monitor patient progress toward functional and medical goals  Care Tool:  Bathing    Body parts bathed by patient: Right arm, Left arm, Chest, Abdomen, Right upper leg, Left upper leg, Right lower leg, Left lower leg, Face   Body parts bathed by helper: Front perineal area, Buttocks     Bathing assist Assist Level: Minimal Assistance - Patient > 75%     Upper Body Dressing/Undressing Upper body dressing   What is the patient wearing?: Pull over shirt    Upper body assist Assist Level: Contact Guard/Touching assist    Lower Body Dressing/Undressing Lower body dressing      What is the patient wearing?: Incontinence brief, Pants     Lower body assist Assist for lower body dressing: Moderate Assistance - Patient 50 - 74%     Toileting Toileting    Toileting assist Assist for toileting: Maximal Assistance - Patient 25 - 49%     Transfers Chair/bed transfer  Transfers assist     Chair/bed transfer assist level: Minimal Assistance - Patient > 75%     Locomotion Ambulation   Ambulation assist   Ambulation activity did not occur: N/A          Walk 10 feet activity   Assist  Walk 10 feet activity did not occur: N/A        Walk 50 feet activity   Assist Walk 50 feet with 2 turns activity did not occur: N/A         Walk 150 feet activity   Assist Walk 150 feet activity did not occur: N/A  Walk 10 feet on uneven surface  activity   Assist Walk 10 feet on uneven surfaces activity did not occur: N/A         Wheelchair     Assist Will patient use wheelchair at discharge?: Yes Type of Wheelchair: Manual    Wheelchair assist level: Supervision/Verbal cueing Max wheelchair distance: 150'    Wheelchair 50 feet with 2 turns activity    Assist        Assist Level: Supervision/Verbal cueing   Wheelchair 150 feet activity     Assist       Assist Level: Supervision/Verbal cueing   Blood pressure 129/70, pulse 70, temperature 98.3 F (36.8 C), temperature source Oral, resp. rate 15, SpO2 98 %.  Medical Problem List and Plan: 1.Functional deficitssecondary to encephalopathy d/t urosepsis, AKI in setting of T-10 myelopathy Continue CIR therapies today including PT and OT  2. Antithrombotics: -DVT/anticoagulation:Pharmaceutical:Lovenox -antiplatelet therapy: N/A 3. Pain Management:Tylenol prn 4. Mood:LCSW to follow for evaluation and support. -antipsychotic agents: N/a 5. Neuropsych: This patientis not fullycapable of making decisions on hisown behalf. -He's not too far from baseline at this point 6. Skin/Wound Care:Routine pressure relief measures. 7. Fluids/Electrolytes/Nutrition: good appetite! 8. Acute renal failure: has resolved 26/1.43-->15/0.91.  9. Klebsiella UTI: ceftriaxone completed  -apparently a lot of sediment and "pus" when foley was removed   -wbc's 12.1 11/17 (increased from 8)--> back to 10 11/19  -ucx with 20k pseudomonas  -stopped cipro given it's potentiation of tizanidine's effects--appreciate pharmacy assistance. He received 3 doses in total--observe for any urinary sx 10. Seizures: Felt to be secondary to baclofen toxicity urosepsis--  Keppra 500 mg bid for now.    -question need to continue keppra long term. Have reached out to neurology without response yet. Will check again with neuro on Tuesday when I return. Can continue for now. 11. Anxiety disorder/tone:  Klonopin 1 mg bid per baseline 10. Paraplegia with spastic hemiplegia: OnTizanidine 4 mg qid, baclofen 20 mg ac/hs -received botox to left hip flexors, hip adductors for persistent tone LLE about 2 weeks ago.   Was experiencing improvement until developing the UTI -monitor for now 11. HTN: Monitor BP bid--off cozaar at this  time. 12.Neurogenic bowel and bladder:remove foley and begin voiding trial. He was voiding continently at home per wife. I question whether he really needed to be cathed for larger PVR's given UTI --foley out -improved urinary continence, pvr's low -still having incontinent stools  -need to get back on daily dig stim program. Have discussed with pt/wife 13. Severe OSA: Continue CPAP whennapping/sleeping.    LOS: 6 days A FACE TO FACE EVALUATION WAS PERFORMED  Clide Deutscher Gerrie Castiglia 09/28/2019, 10:51 AM

## 2019-09-28 NOTE — Progress Notes (Signed)
Patient has home CPAP at bedside. 

## 2019-09-28 NOTE — Progress Notes (Signed)
Occupational Therapy Session Note  Patient Details  Name: David Kim MRN: [id number] Date of Birth: [birth year]  Today's Date: 09/28/2019 OT Individual Time: 1400-1455 OT Individual Time Calculation (min): 55 min    Short Term Goals: Week 1:  OT Short Term Goal 1 (Week 1): STG = LTG d/t ELOS  Skilled Therapeutic Interventions/Progress Updates:    Upon entering the room, pt supine in bed and having just returned minutes prior to OT arrival. Pt was agreeable to OT intervention and wife present in the room. Pt performed bed mobility supine >sit with mod A. Pt's wife placed slide board and set up equipment for transfer with pt transferring with min A overall. Pt propelled wheelchair 100' with supervision to day room. OT provided pt with paper handout for B UE strengthening exercises with wife observing as well. Pt returned demonstration with min A for proper technique and hand placement. Pt performing 10 - 12 reps of chest pulls, shoulder elevation, bicep curls, shoulder diagonals, and alternating punches with level 3 resistive theraband. Pt needing rest breaks secondary to fatigue. OT discussed home set up and pt appears to have appropropriate equipment for discharge. Pt propelled wheelchair back to room in same manner as above. Pt remained in chair with wife present and all needs within reach.   Therapy Documentation Precautions:  Precautions Precautions: Fall Precaution Comments: spastic paraplegia Restrictions Weight Bearing Restrictions: No General:   Vital Signs: Therapy Vitals Temp: 98.2 F (36.8 C) Temp Source: Oral Pulse Rate: 68 Resp: 18 BP: 128/68 Patient Position (if appropriate): Sitting Oxygen Therapy SpO2: 99 % O2 Device: Room Air Pain:   ADL: ADL Eating: Set up Grooming: Minimal assistance Upper Body Bathing: Setup Where Assessed-Upper Body Bathing: Bed level Lower Body Bathing: Minimal assistance Where Assessed-Lower Body Bathing: Bed level Upper Body  Dressing: Setup Where Assessed-Upper Body Dressing: Bed level Lower Body Dressing: Maximal assistance(thread B LE into short and A needed for rolling R and L to pull over buttocks) Where Assessed-Lower Body Dressing: Bed level Toileting: Maximal assistance Where Assessed-Toileting: Bed level Toilet Transfer: Unable to assess Toilet Transfer Method: Unable to assess Tub/Shower Transfer: Unable to assess(Wife report pt does not shower in bathroom; bed level baths only) Tub/Shower Transfer Method: Unable to assess Gaffer Transfer: Unable to assess Social research officer, government Method: Unable to assess   Therapy/Group: Individual Therapy  Gypsy Decant 09/28/2019, 4:26 PM

## 2019-09-28 NOTE — Plan of Care (Signed)
  Problem: Consults Goal: RH GENERAL PATIENT EDUCATION Description: See Patient Education module for education specifics. Outcome: Progressing   Problem: RH BOWEL ELIMINATION Goal: RH STG MANAGE BOWEL WITH ASSISTANCE Description: STG Manage Bowel with  mod Assistance. Outcome: Progressing Goal: RH STG MANAGE BOWEL W/MEDICATION W/ASSISTANCE Description: STG Manage Bowel with Medication with mod Assistance. Outcome: Progressing   Problem: RH SKIN INTEGRITY Goal: RH STG SKIN FREE OF INFECTION/BREAKDOWN Description: Min assist Outcome: Progressing Goal: RH STG MAINTAIN SKIN INTEGRITY WITH ASSISTANCE Description: STG Maintain Skin Integrity With min Assistance. Outcome: Progressing   Problem: RH SAFETY Goal: RH STG ADHERE TO SAFETY PRECAUTIONS W/ASSISTANCE/DEVICE Description: STG Adhere to Safety Precautions With mod I Assistance/Device. Outcome: Progressing   Problem: RH PAIN MANAGEMENT Goal: RH STG PAIN MANAGED AT OR BELOW PT'S PAIN GOAL Description: 4 or less Outcome: Progressing   Problem: RH KNOWLEDGE DEFICIT GENERAL Goal: RH STG INCREASE KNOWLEDGE OF SELF CARE AFTER HOSPITALIZATION Description: Pt will be able to adhere to bowel regimen with min assist from significant other upon discharge.  Outcome: Progressing

## 2019-09-28 NOTE — Progress Notes (Signed)
Occupational Therapy Session Note  Patient Details  Name: David Kim MRN: [id number] Date of Birth: 02-May-1946  Today's Date: 09/28/2019 OT Individual Time: 1000-1100 OT Individual Time Calculation (min): 60 min    Short Term Goals: Week 1:  OT Short Term Goal 1 (Week 1): STG = LTG d/t ELOS  Skilled Therapeutic Interventions/Progress Updates:    Pt received sitting up in the w/c with wife present. No c/o pain. Reviewed OT POC and schedule. Pt completed 100 ft of w/c propulsion to the therapy gym with (S). Pt used slideboard and transferred to mat with assist of wife- min A and good technique from both. Reinforced body mechanics and safety. Pt completed lateral leans onto R and L forearm to challenge sitting balance and lateral leans needed for slideboard placement. Pt then completed sitting balance weight shift activity with no UE support with frequent perturbations and min-mod support intermittently. Pt then held a 5 lb dowel and completed BUE strengthening exercises with a focus on sitting balance. Min A provided intermittently for balance support. Pt completed 3x 10 repetitions with improved balance each set. Pt returned to w.c with wife assistance, CGA! Pt propelled chair back to his room and was left sitting up with all needs met, wife present.   Therapy Documentation Precautions:  Precautions Precautions: Fall Precaution Comments: spastic paraplegia Restrictions Weight Bearing Restrictions: No   Therapy/Group: Individual Therapy  Curtis Sites 09/28/2019, 6:46 AM

## 2019-09-29 ENCOUNTER — Inpatient Hospital Stay (HOSPITAL_COMMUNITY): Payer: Medicare Other

## 2019-09-29 ENCOUNTER — Inpatient Hospital Stay (HOSPITAL_COMMUNITY): Payer: Medicare Other | Admitting: Physical Therapy

## 2019-09-29 ENCOUNTER — Inpatient Hospital Stay (HOSPITAL_COMMUNITY): Payer: Medicare Other | Admitting: Speech Pathology

## 2019-09-29 LAB — CBC
HCT: 43.8 % (ref 39.0–52.0)
Hemoglobin: 13.9 g/dL (ref 13.0–17.0)
MCH: 28.9 pg (ref 26.0–34.0)
MCHC: 31.7 g/dL (ref 30.0–36.0)
MCV: 91.1 fL (ref 80.0–100.0)
Platelets: 366 10*3/uL (ref 150–400)
RBC: 4.81 MIL/uL (ref 4.22–5.81)
RDW: 14.1 % (ref 11.5–15.5)
WBC: 6.7 10*3/uL (ref 4.0–10.5)
nRBC: 0 % (ref 0.0–0.2)

## 2019-09-29 LAB — BASIC METABOLIC PANEL
Anion gap: 9 (ref 5–15)
BUN: 18 mg/dL (ref 8–23)
CO2: 24 mmol/L (ref 22–32)
Calcium: 8.7 mg/dL — ABNORMAL LOW (ref 8.9–10.3)
Chloride: 106 mmol/L (ref 98–111)
Creatinine, Ser: 0.85 mg/dL (ref 0.61–1.24)
GFR calc Af Amer: >60 mL/min (ref >60)
GFR calc non Af Amer: >60 mL/min (ref >60)
Glucose, Bld: 92 mg/dL (ref 70–99)
Potassium: 3.8 mmol/L (ref 3.5–5.1)
Sodium: 139 mmol/L (ref 135–145)

## 2019-09-29 NOTE — Progress Notes (Signed)
Physical Therapy Session Note  Patient Details  Name: David Kim MRN: [id number] Date of Birth: February 04, 1946  Today's Date: 09/29/2019 PT Individual Time: 1045-1200 PT Individual Time Calculation (min): 75 min   Short Term Goals: Week 1:  PT Short Term Goal 1 (Week 1): =LTGs due to ELOS  Skilled Therapeutic Interventions/Progress Updates:   Pt in supine and agreeable to therapy, denies pain. Wife reporting pt incontinent of bowel. R/L rolling w/ supervision and verbal cues for BLE management. Total assist for pericare and brief management, donned pants total assist in supine as well. Supine>sit w/ mod assist and min assist slide board transfer to w/c, wife providing all physical assist including verbal and tactile cues for technique. Pt self-propelled w/c to/from therapy gym. W/c rep present from [facility] to discuss modifications to pt's current w/c. Repairs to include tightening of armrests and replacing of armrests pads. Modifications to include addition of seat belt as pt requires supervision for static sitting balance in chair w/o it, otherwise is mod/i w/ seat belt, and calf strap to improve sensory input and support to calves to decrease BLE spasms. Without calf strap, pt's BLEs in near constant state of spasms into flexion synergy, L>R. This therapist educated pt and wife on use of allen wrenches to tighten parts of w/c including armrests. Additionally educated on bringing foot plates on foot rests higher for improved contact and support of feet. Demonstrated this w/ Zenia Resides wrench as well. Returned to room via w/c, ended session in w/c and in care of wife - all needs met.   Therapy Documentation Precautions:  Precautions Precautions: Fall Precaution Comments: spastic paraplegia Restrictions Weight Bearing Restrictions: No Pain: Pain Assessment Pain Scale: 0-10 Pain Score: 0-No pain  Therapy/Group: Individual Therapy  Jayleah Garbers Clent Demark 09/29/2019, 12:20 PM

## 2019-09-29 NOTE — Progress Notes (Signed)
Speech Language Pathology Discharge Summary  Patient Details  Name: David Kim MRN: [id number] Date of Birth: 05/14/1946  Today's Date: 09/29/2019 SLP Individual Time: 2341-4436 SLP Individual Time Calculation (min): 55 min   Skilled Therapeutic Interventions:   Skilled treatment session focused on dysphagia and cognitive goals. SLP facilitated session by providing skilled observation with breakfast meal of regular textures with thin liquids. Patient completed meal without overt s/s of aspiration with overall Mod I for use of swallowing compensatory strategies but required overall supervision level verbal cues for divided attention between self-feeding and functional conversation for ~30 minutes. SLP also facilitated session by providing education in regards to memory compensatory strategies in which patient verbalized how to implement strategies at home with overall supervision level verbal cues. Patient left upright in bed with alarm on and all needs within reach.   Patient has met 4 of 4 long term goals.  Patient to discharge at overall Modified Independent;Supervision level.   Reasons goals not met: N/A   Clinical Impression/Discharge Summary: Patient has made functional gains and has met 4 of 4 LTGs this admission. Currently, patient is consuming regular textures with thin liquids without overt s/s of aspiration with overall Mod I for use of swallowing compensatory strategies. Patient also completes functional and semi-complex tasks with overall supervision level verbal cues to Mod I in regards to problem solving, attention, memory and awareness. Patient is at his baseline level of cognitive and swallowing function, therefore, patient will be discharged from skilled SLP intervention with f/u not warranted at this time. Both the patient and his wife verbalized understanding and agreement.   Care Partner:  Caregiver Able to Provide Assistance: Yes  Type of Caregiver Assistance:  Physical  Recommendation:  None      Equipment: N/A   Reasons for discharge: Treatment goals met   Patient/Family Agrees with Progress Made and Goals Achieved: Yes    Sonam Wandel, [place] 09/29/2019, 7:58 AM

## 2019-09-29 NOTE — Progress Notes (Signed)
Pt refused bowel management program (Dig stim). Pt and wife both stated "There is watery, runny stool, There is no need to continue the program". Pt and wife were both educated on the benefits of the program. Pt's wife stressed concerns of wanting dig stim to be done during the morning hours and only to be done if pt passes solid stools. No other concerns to report at this time.

## 2019-09-29 NOTE — Progress Notes (Signed)
[place] PHYSICAL MEDICINE & REHABILITATION PROGRESS NOTE   Subjective/Complaints: No new complaints. Had BM this morning. Denies pain, increased spasticity. Asks whether he will need to take his home BP medication when he returns home.   ROS: Patient denies fever, rash, sore throat, blurred vision, nausea, vomiting,  cough, shortness of breath or chest pain, joint or back pain, headache, or mood change.   Objective:   No results found. Recent Labs    09/29/19 0644  WBC 6.7  HGB 13.9  HCT 43.8  PLT 366   Recent Labs    09/29/19 0644  NA 139  K 3.8  CL 106  CO2 24  GLUCOSE 92  BUN 18  CREATININE 0.85  CALCIUM 8.7*    Intake/Output Summary (Last 24 hours) at 09/29/2019 [id number] Last data filed at 09/29/2019 [id number] Gross per 24 hour  Intake 600 ml  Output 1000 ml  Net -400 ml     Physical Exam: Vital Signs Blood pressure 128/81, pulse 76, temperature 98.9 F (37.2 C), temperature source Oral, resp. rate 15, SpO2 95 %. Constitutional: No distress . Vital signs reviewed. HEENT: EOMI, oral membranes moist Neck: supple Cardiovascular: RRR without murmur. No JVD    Respiratory: CTA Bilaterally without wheezes or rales. Normal effort    GI: BS +, non-tender, non-distended  Musculoskeletal:  General: No edema.  Neurological: Fair insight. Followed commands. Processing a little better.   UE 5/5. LE: 0/5 bilateral LE's. DTR's 3+ bilateral LE with 2-3 beats clonus. LLE: ongoing hip flexor tightness, hamstring, hip adductor tightness. Can be stretched fairly easily.  Minimal sensation to LT, Pain in LE's. No motor or tone changes today Skin: Skin iswarm. He isnot diaphoretic. Noerythema.  Psychiatric: Flat but cooperative, more interactive    Assessment/Plan: 1. Functional deficits secondary to encephalopathy d/t urosepsis and AKI which require 3+ hours per day of interdisciplinary therapy in a comprehensive inpatient rehab setting.  Physiatrist is providing  close team supervision and 24 hour management of active medical problems listed below.  Physiatrist and rehab team continue to assess barriers to discharge/monitor patient progress toward functional and medical goals  Care Tool:  Bathing    Body parts bathed by patient: Right arm, Left arm, Chest, Abdomen, Right upper leg, Left upper leg, Right lower leg, Left lower leg, Face   Body parts bathed by helper: Front perineal area, Buttocks     Bathing assist Assist Level: Minimal Assistance - Patient > 75%     Upper Body Dressing/Undressing Upper body dressing   What is the patient wearing?: Pull over shirt    Upper body assist Assist Level: Contact Guard/Touching assist    Lower Body Dressing/Undressing Lower body dressing      What is the patient wearing?: Incontinence brief, Pants     Lower body assist Assist for lower body dressing: Moderate Assistance - Patient 50 - 74%     Toileting Toileting    Toileting assist Assist for toileting: Maximal Assistance - Patient 25 - 49%     Transfers Chair/bed transfer  Transfers assist     Chair/bed transfer assist level: Minimal Assistance - Patient > 75%     Locomotion Ambulation   Ambulation assist   Ambulation activity did not occur: N/A          Walk 10 feet activity   Assist  Walk 10 feet activity did not occur: N/A        Walk 50 feet activity   Assist Walk 50 feet with  2 turns activity did not occur: N/A         Walk 150 feet activity   Assist Walk 150 feet activity did not occur: N/A         Walk 10 feet on uneven surface  activity   Assist Walk 10 feet on uneven surfaces activity did not occur: N/A         Wheelchair     Assist Will patient use wheelchair at discharge?: Yes Type of Wheelchair: Manual    Wheelchair assist level: Supervision/Verbal cueing Max wheelchair distance: 150'    Wheelchair 50 feet with 2 turns activity    Assist        Assist  Level: Supervision/Verbal cueing   Wheelchair 150 feet activity     Assist      Assist Level: Supervision/Verbal cueing   Blood pressure 128/81, pulse 76, temperature 98.9 F (37.2 C), temperature source Oral, resp. rate 15, SpO2 95 %.  Medical Problem List and Plan: 1.Functional deficitssecondary to encephalopathy d/t urosepsis, AKI in setting of T-10 myelopathy Continue CIR therapies today including PT and OT  2. Antithrombotics: -DVT/anticoagulation:Pharmaceutical:Lovenox -antiplatelet therapy: N/A 3. Pain Management:Tylenol prn 4. Mood:LCSW to follow for evaluation and support. -antipsychotic agents: N/a 5. Neuropsych: This patientis not fullycapable of making decisions on hisown behalf. -He's not too far from baseline at this point 6. Skin/Wound Care:Routine pressure relief measures. 7. Fluids/Electrolytes/Nutrition: good appetite! Labs stable 11/23 8. Acute renal failure: has resolved 26/1.43-->15/0.91.  9. Klebsiella UTI: ceftriaxone completed  -apparently a lot of sediment and "pus" when foley was removed   -wbc's 12.1 11/17 (increased from 8)--> back to 10 11/19  -ucx with 20k pseudomonas  -stopped cipro given it's potentiation of tizanidine's effects--appreciate pharmacy assistance. He received 3 doses in total--observe for any urinary sx 10. Seizures: Felt to be secondary to baclofen toxicity urosepsis--  Keppra 500 mg bid for now.    -question need to continue keppra long term. Have reached out to neurology without response yet. Will check again with neuro on Tuesday when I return. Can continue for now. 11. Anxiety disorder/tone:  Klonopin 1 mg bid per baseline 10. Paraplegia with spastic hemiplegia: OnTizanidine 4 mg qid, baclofen 20 mg ac/hs -received botox to left hip flexors, hip adductors for persistent tone LLE about 2 weeks ago.   Was experiencing improvement until developing the  UTI -monitor for now 11. HTN: Monitor BP bid--off cozaar at this time.  11/23: Continues to have good BM control off Cozaar. Educated that he will not need to resume this medication at home if BP remains stable during his hospital stay.  12.Neurogenic bowel and bladder:remove foley and begin voiding trial. He was voiding continently at home per wife. I question whether he really needed to be cathed for larger PVR's given UTI --foley out -improved urinary continence, pvr's low -still having incontinent stools  -need to get back on daily dig stim program. Have discussed with pt/wife 13. Severe OSA: Continue CPAP whennapping/sleeping.    LOS: 7 days A FACE TO FACE EVALUATION WAS PERFORMED  Clide Deutscher David Kim 09/29/2019, 9:27 AM

## 2019-09-29 NOTE — Progress Notes (Signed)
Occupational Therapy Session Note  Patient Details  Name: HANCEL GEPHART MRN: [id number] Date of Birth: 1946-10-29  Today's Date: 09/29/2019 OT Individual Time: 1330-1425 OT Individual Time Calculation (min): 55 min    Short Term Goals: Week 1:  OT Short Term Goal 1 (Week 1): STG = LTG d/t ELOS  Skilled Therapeutic Interventions/Progress Updates:    Pt resting in w/c upon arrival with wife present.  OT intervention with focus on continued discharge planning/educaiton and making adjustments to w/c to facilitate improved comfort and safety.  Adjustments to B foot rests after shoes placed on pt's feet.  Webbing also removed from back of R foot rest.  Explained and demonstrated to pt's wife what was accomplished and how to make future adjustments including webbing on back of foot rest.  Continued with discharge planning and education in preparation for discharge on 11/25. Pt remained in w/c with seat belt in place and wife present.   Therapy Documentation Precautions:  Precautions Precautions: Fall Precaution Comments: spastic paraplegia Restrictions Weight Bearing Restrictions: No Pain: Pain Assessment Pain Scale: 0-10 Pain Score: 0-No pain   Therapy/Group: Individual Therapy  Leroy Libman 09/29/2019, 2:30 PM

## 2019-09-30 ENCOUNTER — Inpatient Hospital Stay (HOSPITAL_COMMUNITY): Payer: Medicare Other | Admitting: Physical Therapy

## 2019-09-30 ENCOUNTER — Inpatient Hospital Stay (HOSPITAL_COMMUNITY): Payer: Medicare Other

## 2019-09-30 MED ORDER — CLONAZEPAM 2 MG PO TABS: 2.0000 mg | ORAL_TABLET | Freq: Two times a day (BID) | ORAL | 3 refills | Status: DC

## 2019-09-30 MED ORDER — SACCHAROMYCES BOULARDII 250 MG PO CAPS
250.0000 mg | ORAL_CAPSULE | Freq: Two times a day (BID) | ORAL | Status: DC
  Administered 2019-09-30 – 2019-10-01 (×3): 250 mg via ORAL
  Filled 2019-09-30 (×3): qty 1

## 2019-09-30 MED ORDER — TIZANIDINE HCL 4 MG PO TABS: 4.0000 mg | ORAL_TABLET | Freq: Four times a day (QID) | ORAL | 3 refills | Status: DC

## 2019-09-30 MED ORDER — SACCHAROMYCES BOULARDII 250 MG PO CAPS: 250.0000 mg | ORAL_CAPSULE | Freq: Two times a day (BID) | ORAL | Status: DC

## 2019-09-30 MED ORDER — BISACODYL 10 MG RE SUPP: 10.0000 mg | Freq: Every day | RECTAL | 0 refills | Status: DC

## 2019-09-30 NOTE — Progress Notes (Addendum)
Physical Therapy Session Note  Patient Details  Name: David Kim MRN: [id number] Date of Birth: 1946/04/07  Today's Date: 09/30/2019 PT Individual Time: 1115-1158 PT Individual Time Calculation (min): 43 min   Short Term Goals: Week 1:  PT Short Term Goal 1 (Week 1): =LTGs due to ELOS  Skilled Therapeutic Interventions/Progress Updates:  Pt missed first few minutes of skilled PT treatment 2/2 nursing care.  Pt received in room & handed off to PT. Pt's wife assisted pt with with slide board transfer bed<>w/c and w/c<>mat table in gym with min assist with therapist providing cuing 1x for head/hips relationship but otherwise pt & wife do not require cuing for technique, nor physical assist. Pt propels w/c room<>gym with BUE & mod I. While sitting EOM pt engaged in performing PNF pattern with 2 kg weighted ball but when pt attempts to perform pattern in opposite direction pt unable 2/2 significant LOB. Pt assisted with scooting forward to edge of mat to place BLE on floor to increase stability and transitioned focus to static sitting without BUE support with pt requiring min<>max assist with pt frequently attempting to use BUE for counterbalance and demonstrates difficulty shifting trunk posteriorly to find midline sitting balance. At end of session pt left in w/c in room with wife present to supervise.   Provided washcloth padding to RUE armrest to increase comfort and prevent any skin breakdown issues until pt's armrest is properly modified.  Therapy Documentation Precautions:  Precautions Precautions: Fall Precaution Comments: spastic paraplegia Restrictions Weight Bearing Restrictions: No   General: PT Amount of Missed Time (min): 15 Minutes PT Missed Treatment Reason: Nursing care  Pain: No c/o pain reported.   Therapy/Group: Individual Therapy  [name] 09/30/2019, 12:43 PM

## 2019-09-30 NOTE — Plan of Care (Signed)
  Problem: Consults Goal: RH GENERAL PATIENT EDUCATION Description: See Patient Education module for education specifics. Outcome: Progressing   Problem: RH BOWEL ELIMINATION Goal: RH STG MANAGE BOWEL WITH ASSISTANCE Description: STG Manage Bowel with  mod Assistance. Outcome: Progressing Goal: RH STG MANAGE BOWEL W/MEDICATION W/ASSISTANCE Description: STG Manage Bowel with Medication with mod Assistance. Outcome: Progressing   Problem: RH SKIN INTEGRITY Goal: RH STG SKIN FREE OF INFECTION/BREAKDOWN Description: Min assist Outcome: Progressing Goal: RH STG MAINTAIN SKIN INTEGRITY WITH ASSISTANCE Description: STG Maintain Skin Integrity With min Assistance. Outcome: Progressing   Problem: RH SAFETY Goal: RH STG ADHERE TO SAFETY PRECAUTIONS W/ASSISTANCE/DEVICE Description: STG Adhere to Safety Precautions With mod I Assistance/Device. Outcome: Progressing   Problem: RH PAIN MANAGEMENT Goal: RH STG PAIN MANAGED AT OR BELOW PT'S PAIN GOAL Description: 4 or less Outcome: Progressing   Problem: RH KNOWLEDGE DEFICIT GENERAL Goal: RH STG INCREASE KNOWLEDGE OF SELF CARE AFTER HOSPITALIZATION Description: Pt will be able to adhere to bowel regimen with min assist from significant other upon discharge.  Outcome: Progressing

## 2019-09-30 NOTE — Progress Notes (Signed)
Patient places himself on CPAP when he is ready. °

## 2019-09-30 NOTE — Progress Notes (Signed)
Physical Therapy Discharge Summary  Patient Details  Name: David Kim MRN: [id number] Date of Birth: 20-Apr-1946  Today's Date: 09/30/2019 PT Individual Time: 7824-2353 PT Individual Time Calculation (min): 54 min   Pt in w/c and agreeable to therapy, no c/o pain. Pt self-propelled w/c halfway to outside of hospital, mod/i w/ BUEs. Total assist remainder of way for energy conservation. Performed real car transfer w/ wife outside, wife performed all set-up and assist of slide board transfer (min assist) w/o cues from therapist. Both feel confident and in agreement that pt is at [facility] for bed mobility, transfers, and w/c mobility. Returned to unit, pt self-propelled last 200' at mod/i level. Therapist tightened pt's armrest, ongoing education to pt on how to tighten his own armrests at home w/ allen wrenches. Returned to room and ended session in w/c, in care of wife and all needs met.   Patient has met 6 of 6 long term goals due to improved activity tolerance, improved balance, improved postural control, ability to compensate for deficits, functional use of  right upper extremity and left upper extremity, improved attention, improved awareness and improved coordination.  Patient to discharge at a wheelchair level Modified Independent.   Patient's care partner is independent to provide the necessary physical assistance at discharge.  Reasons goals not met: n/a  Recommendation:  Patient will benefit from ongoing skilled PT services in home health setting to continue to advance safe functional mobility, address ongoing impairments in sitting balance, endurance, and global strengthening, and minimize fall risk.  Equipment: No equipment provided  Reasons for discharge: treatment goals met and discharge from hospital  Patient/family agrees with progress made and goals achieved: Yes  PT Discharge Precautions/Restrictions Precautions Precautions: Fall Precaution Comments: spastic  paraplegia Restrictions Weight Bearing Restrictions: No Pain Pain Assessment Pain Scale: 0-10 Pain Score: 0-No pain Vision/Perception  Perception Perception: Within Functional Limits Praxis Praxis: Intact  Cognition Overall Cognitive Status: Within Functional Limits for tasks assessed Arousal/Alertness: Awake/alert Orientation Level: Oriented X4 Attention: Sustained Sustained Attention: Appears intact Sustained Attention Impairment: Verbal basic Memory: Impaired Memory Impairment: Decreased short term memory Decreased Short Term Memory: Verbal complex Awareness: Appears intact Awareness Impairment: Anticipatory impairment Problem Solving: Appears intact Sequencing: Appears intact Organizing: Appears intact Safety/Judgment: Appears intact Sensation Sensation Light Touch: Impaired by gross assessment(T10 level and below) Hot/Cold: Appears Intact Proprioception: Appears Intact Coordination Gross Motor Movements are Fluid and Coordinated: No Coordination and Movement Description: Spastic paraplegia Finger Nose Finger Test: [facility] Motor  Motor Motor: Paraplegia;Abnormal tone;Primitive reflexes present Motor - Discharge Observations: Spastic paraplegia at baseline, LLE> RLE  Mobility Bed Mobility Bed Mobility: Rolling Right;Supine to Sit;Rolling Left;Sit to Supine;Right Sidelying to Sit;Scooting to [facility];Sit to Sidelying Right Rolling Right: Minimal Assistance - Patient > 75% Rolling Left: Minimal Assistance - Patient > 75% Right Sidelying to Sit: Minimal Assistance - Patient > 75% Sitting - Scoot to Edge of Bed: Minimal Assistance - Patient > 75% Sit to Supine: Minimal Assistance - Patient > 75% Sit to Sidelying Right: Minimal Assistance - Patient > 75% Scooting to HOB: Minimal Assistance - Patient > 75% Transfers Transfers: Lateral/Scoot Transfers Lateral/Scoot Transfers: Minimal Assistance - Patient > 75% Transfer (Assistive device): Other  (Comment)(slideboard) Locomotion  Gait Ambulation: No Gait Gait: No Stairs / Additional Locomotion Stairs: No Wheelchair Mobility Wheelchair Mobility: Yes Wheelchair Assistance: Independent with Camera operator: Both upper extremities Wheelchair Parts Management: Independent Distance: 150'  Trunk/Postural Assessment  Cervical Assessment Cervical Assessment: Within Functional Limits Thoracic Assessment Thoracic Assessment: Within  Functional Limits Lumbar Assessment Lumbar Assessment: Exceptions to WFL(posterior pelvic tilt, T10 level SCI) Postural Control Postural Control: Deficits on evaluation(baseline T10 SCI)  Balance Balance Balance Assessed: Yes Static Sitting Balance Static Sitting - Level of Assistance: 5: Stand by assistance Dynamic Sitting Balance Dynamic Sitting - Level of Assistance: 4: Min assist (CGA)  Extremity Assessment  RUE Assessment RUE Assessment: Within Functional Limits LUE Assessment LUE Assessment: Within Functional Limits   Tarika Mckethan K Season Astacio 09/30/2019, 10:28 AM

## 2019-09-30 NOTE — Progress Notes (Signed)
PHYSICAL MEDICINE & REHABILITATION PROGRESS NOTE   Subjective/Complaints: Still concerned about loose stools. Otherwise feeling well  ROS: Patient denies fever, rash, sore throat, blurred vision, nausea, vomiting, diarrhea, cough, shortness of breath or chest pain, joint or back pain, headache, or mood change.  Objective:   No results found. Recent Labs    09/29/19 0644  WBC 6.7  HGB 13.9  HCT 43.8  PLT 366   Recent Labs    09/29/19 0644  NA 139  K 3.8  CL 106  CO2 24  GLUCOSE 92  BUN 18  CREATININE 0.85  CALCIUM 8.7*    Intake/Output Summary (Last 24 hours) at 09/30/2019 1029 Last data filed at 09/30/2019 0946 Gross per 24 hour  Intake 360 ml  Output 700 ml  Net -340 ml     Physical Exam: Vital Signs Blood pressure 136/77, pulse 78, temperature 98.1 F (36.7 C), temperature source Oral, resp. rate 15, SpO2 95 %. Constitutional: No distress . Vital signs reviewed. HEENT: EOMI, oral membranes moist Neck: supple Cardiovascular: RRR without murmur. No JVD    Respiratory: CTA Bilaterally without wheezes or rales. Normal effort    GI: BS +, non-tender, non-distended  Musculoskeletal:  General: No edema.  Neurological: improved insight and awareness. Very alert--at baseline.   UE 5/5. LE: 0/5 bilateral LE's. DTR's 3+ bilateral LE with 2-3 beats clonus. LLE: ongoing hip flexor tightness noted MAS 2/4, hamstring, hip adductor tightness also.    Minimal sensation to LT, Pain in LE's.   Skin: Skin iswarm. He isnot diaphoretic. Noerythema.  Psychiatric: Cooperative, flat    Assessment/Plan: 1. Functional deficits secondary to encephalopathy d/t urosepsis and AKI which require 3+ hours per day of interdisciplinary therapy in a comprehensive inpatient rehab setting.  Physiatrist is providing close team supervision and 24 hour management of active medical problems listed below.  Physiatrist and rehab team continue to assess barriers to  discharge/monitor patient progress toward functional and medical goals  Care Tool:  Bathing    Body parts bathed by patient: Right arm, Left arm, Chest, Abdomen, Right upper leg, Left upper leg, Right lower leg, Left lower leg, Face   Body parts bathed by helper: Front perineal area, Buttocks     Bathing assist Assist Level: Minimal Assistance - Patient > 75%     Upper Body Dressing/Undressing Upper body dressing   What is the patient wearing?: Pull over shirt    Upper body assist Assist Level: Minimal Assistance - Patient > 75%    Lower Body Dressing/Undressing Lower body dressing      What is the patient wearing?: Incontinence brief, Pants     Lower body assist Assist for lower body dressing: Minimal Assistance - Patient > 75%     Toileting Toileting    Toileting assist Assist for toileting: Moderate Assistance - Patient 50 - 74%     Transfers Chair/bed transfer  Transfers assist     Chair/bed transfer assist level: Minimal Assistance - Patient > 75%     Locomotion Ambulation   Ambulation assist   Ambulation activity did not occur: N/A          Walk 10 feet activity   Assist  Walk 10 feet activity did not occur: N/A        Walk 50 feet activity   Assist Walk 50 feet with 2 turns activity did not occur: N/A         Walk 150 feet activity   Assist Walk 150 feet  activity did not occur: N/A         Walk 10 feet on uneven surface  activity   Assist Walk 10 feet on uneven surfaces activity did not occur: N/A         Wheelchair     Assist Will patient use wheelchair at discharge?: Yes Type of Wheelchair: Manual    Wheelchair assist level: Supervision/Verbal cueing Max wheelchair distance: 150'    Wheelchair 50 feet with 2 turns activity    Assist        Assist Level: Supervision/Verbal cueing   Wheelchair 150 feet activity     Assist      Assist Level: Supervision/Verbal cueing   Blood pressure  136/77, pulse 78, temperature 98.1 F (36.7 C), temperature source Oral, resp. rate 15, SpO2 95 %.  Medical Problem List and Plan: 1.Functional deficitssecondary to encephalopathy d/t urosepsis, AKI in setting of T-10 myelopathy Continue CIR therapies today including PT and OT   -f/u with me or NP in the office in a 1-2 weeks for TC visit 2. Antithrombotics: -DVT/anticoagulation:Pharmaceutical:Lovenox -antiplatelet therapy: N/A 3. Pain Management:Tylenol prn 4. Mood:LCSW to follow for evaluation and support. -antipsychotic agents: N/a 5. Neuropsych: This patientis not fullycapable of making decisions on hisown behalf. -He's not too far from baseline at this point 6. Skin/Wound Care:Routine pressure relief measures. 7. Fluids/Electrolytes/Nutrition: good appetite! Labs stable 11/23 8. Acute renal failure: has resolved 26/1.43-->15/0.91.  9. Klebsiella UTI: ceftriaxone completed  -apparently a lot of sediment and "pus" when foley was removed   -wbc's 12.1 11/17 (increased from 8)--> back to 10 11/19  -ucx with 20k pseudomonas  -stopped cipro given it's potentiation of tizanidine's effects--appreciate pharmacy assistance. He received 3 doses in total--observe for any urinary sx 10. Seizures: Felt to be secondary to baclofen toxicity urosepsis--  Keppra 500 mg bid for now.    -following up with neuro re: need to continue keppra at discharge 11. Anxiety disorder/tone:  Klonopin 1 mg bid per baseline 10. Paraplegia with spastic hemiplegia: OnTizanidine 4 mg qid, baclofen 20 mg ac/hs -received botox to left hip flexors, hip adductors for persistent tone LLE about 2 weeks ago.   Was experiencing improvement until developing the UTI -monitor for now 11. HTN: Monitor BP bid--off cozaar at this time.  11/24: Continues to have good BM control off Cozaar.    -will not continue once  discharged--(can keep it on hand if things change however)  12.Neurogenic bowel and bladder:remove foley and begin voiding trial. He was voiding continently at home per wife. I question whether he really needed to be cathed for larger PVR's given UTI --foley out -improved urinary continence, pvr's low -having incontinent, soft stools---may be what he's eating here   -add probiotic 13. Severe OSA: Continue CPAP whennapping/sleeping.    LOS: 8 days A FACE TO FACE EVALUATION WAS PERFORMED  Meredith Staggers 09/30/2019, 10:29 AM
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
## 2019-09-30 NOTE — Progress Notes (Signed)
Occupational Therapy Discharge Summary  Patient Details  Name: David Kim MRN: [id number] Date of Birth: 06-Nov-1946  Today's Date: 09/30/2019 OT Individual Time: 2751-7001 OT Individual Time Calculation (min): 75 min    Patient has met 5 of 5 long term goals due to improved activity tolerance, improved balance, postural control, ability to compensate for deficits, improved attention, improved awareness and improved coordination.  Patient to discharge at [facility] Assist level.  Patient's care partner is independent to provide the necessary physical and cognitive assistance at discharge.    Reasons goals not met: All treatment goals met  Recommendation:  Patient will benefit from ongoing skilled OT services in home health setting to continue to advance functional skills in the area of BADL.  Equipment: No equipment provided  Reasons for discharge: treatment goals met and discharge from hospital  Patient/family agrees with progress made and goals achieved: Yes   Skilled OT Intervention: Pt received supine with no c/o pain. Discussed d/c planning with pt. Pt used urinal at bed level, set up assist provided, voiding about 75 ml with increased time and effort. Pt completed UB bathing and dressing at bed level with min A. Mod A for peri hygiene, pt otherwise able to complete all LB bathing in long sitting. Pt returned to supine and required mod A for peri care 2/2 incontinent BM. Pt able to transition into long sitting with heavy use of bed features and he threaded shorts onto BLE and pulled up with rolling R and L with min A. Pt donned shirt with CGA. Pt was set up for slideboard transfer and then had loud gas. Pt returned to supine to check for incontinent BM- none present. Pt completed slideboard transfer to w/c with min A. Pt was left sitting up in the w/c with all needs met.   OT Discharge Precautions/Restrictions  Precautions Precautions: Fall Precaution Comments: spastic  paraplegia Restrictions Weight Bearing Restrictions: No Pain Pain Assessment Pain Scale: 0-10 Pain Score: 0-No pain ADL ADL Eating: Set up Grooming: Supervision/safety Where Assessed-Grooming: Sitting at sink Upper Body Bathing: Setup Where Assessed-Upper Body Bathing: Bed level Lower Body Bathing: Minimal assistance Where Assessed-Lower Body Bathing: Bed level Upper Body Dressing: Setup Where Assessed-Upper Body Dressing: Bed level Lower Body Dressing: Minimal assistance Where Assessed-Lower Body Dressing: Bed level Toileting: Moderate assistance Where Assessed-Toileting: Bed level Toilet Transfer: Unable to assess Toilet Transfer Method: Unable to assess Tub/Shower Transfer: Unable to assess(not completed at baseline) Tub/Shower Transfer Method: Unable to assess [employer] Transfer: Unable to assess [employer] Transfer Method: Unable to assess Vision Baseline Vision/History: Cataracts;Wears glasses Wears Glasses: At all times Patient Visual Report: No change from baseline Vision Assessment?: No apparent visual deficits Perception  Perception: Within Functional Limits Praxis Praxis: Intact Cognition Overall Cognitive Status: Within Functional Limits for tasks assessed Arousal/Alertness: Awake/alert Orientation Level: Oriented X4 Attention: Sustained Sustained Attention: Appears intact Sustained Attention Impairment: Verbal basic Memory: Impaired Memory Impairment: Decreased short term memory Decreased Short Term Memory: Verbal complex Awareness: Appears intact Awareness Impairment: Anticipatory impairment Problem Solving: Appears intact Sequencing: Appears intact Organizing: Appears intact Safety/Judgment: Appears intact Sensation Sensation Light Touch: Impaired by gross assessment(T10 level and below) Hot/Cold: Appears Intact Proprioception: Appears Intact Coordination Gross Motor Movements are Fluid and Coordinated: No Coordination and Movement  Description: Spastic paraplegia Finger Nose Finger Test: [facility] Motor  Motor Motor: Paraplegia;Abnormal tone;Primitive reflexes present Motor - Discharge Observations: Spastic paraplegia at baseline, LLE> RLE Mobility  Bed Mobility Bed Mobility: Rolling Right;Supine to Sit;Rolling Left;Sit to  Supine;Right Sidelying to Sit;Scooting to [facility]Sit to Sidelying Right Rolling Right: Minimal Assistance - Patient > 75% Rolling Left: Minimal Assistance - Patient > 75% Right Sidelying to Sit: Minimal Assistance - Patient > 75% Sitting - Scoot to Edge of Bed: Minimal Assistance - Patient > 75% Sit to Supine: Minimal Assistance - Patient > 75% Sit to Sidelying Right: Minimal Assistance - Patient > 75% Scooting to HOB: Minimal Assistance - Patient > 75%  Trunk/Postural Assessment  Cervical Assessment Cervical Assessment: Within Functional Limits Thoracic Assessment Thoracic Assessment: Within Functional Limits Lumbar Assessment Lumbar Assessment: Exceptions to WFL(posterior pelvic tilt, T10 level SCI) Postural Control Postural Control: Deficits on evaluation(baseline T10 SCI)  Balance Balance Balance Assessed: Yes Static Sitting Balance Static Sitting - Level of Assistance: 5: Stand by assistance Dynamic Sitting Balance Dynamic Sitting - Level of Assistance: 4: Min assist Extremity/Trunk Assessment RUE Assessment RUE Assessment: Within Functional Limits LUE Assessment LUE Assessment: Within Functional Limits   Curtis Sites 09/30/2019, 8:35 AM

## 2019-10-01 MED ORDER — CALCIUM CITRATE-VITAMIN D 500-500 MG-UNIT PO CHEW: 1.0000 | CHEWABLE_TABLET | Freq: Every day | ORAL | Status: AC

## 2019-10-01 MED ORDER — CALCIUM CITRATE-VITAMIN D 500-500 MG-UNIT PO CHEW
1.0000 | CHEWABLE_TABLET | Freq: Every day | ORAL | Status: DC
  Filled 2019-10-01 (×2): qty 1

## 2019-10-01 MED ORDER — LOPERAMIDE HCL 2 MG PO CAPS: 2.0000 mg | ORAL_CAPSULE | ORAL | Status: DC | PRN

## 2019-10-01 MED ORDER — LOPERAMIDE HCL 2 MG PO CAPS: 2.0000 mg | ORAL_CAPSULE | ORAL | 0 refills | Status: DC | PRN

## 2019-10-01 NOTE — Progress Notes (Signed)
**David Kim De-Identified via Obfuscation** David David Kim   Subjective/Complaints: No new complaints. Had BM this morning. Denies pain, increased spasticity. Asks whether he will need to take his home BP medication when he returns home. His wife at bedside also asks this. She also asks whether he will need follow-up with neurology post-discharge.   ROS: Patient denies fever, rash, sore throat, blurred vision, nausea, vomiting,  cough, shortness of breath or chest pain, joint or back pain, headache, or mood change.   Objective:   No results found. Recent Labs    09/29/19 0644  WBC 6.7  HGB 13.9  HCT 43.8  PLT 366   Recent Labs    09/29/19 0644  NA 139  K 3.8  CL 106  CO2 24  GLUCOSE 92  BUN 18  CREATININE 0.85  CALCIUM 8.7*    Intake/Output Summary (Last 24 hours) at 10/01/2019 1003 Last data filed at 10/01/2019 1002 Gross per 24 hour  Intake 480 ml  Output 975 ml  Net -495 ml     Physical Exam: Vital Signs Blood pressure 116/73, pulse 69, temperature 97.8 F (36.6 C), temperature source Oral, resp. rate 18, weight 86 kg, SpO2 96 %. Constitutional: No distress . Vital signs reviewed. HEENT: EOMI, oral membranes moist Neck: supple Cardiovascular: RRR without murmur. No JVD    Respiratory: CTA Bilaterally without wheezes or rales. Normal effort    GI: BS +, non-tender, non-distended  Musculoskeletal:  General: No edema.  Neurological: Fair insight. Followed commands. Processing a little better.   UE 5/5. LE: 0/5 bilateral LE's. DTR's 3+ bilateral LE with 2 beats clonus. LLE: ongoing hip flexor tightness, hamstring, hip adductor tightness. Can be stretched fairly easily.  Minimal sensation to LT, Pain in LE's. No motor or tone changes today Skin: Skin iswarm. He isnot diaphoretic. Noerythema.  Psychiatric: Flat but cooperative, more interactive    Assessment/Plan: 1. Functional deficits secondary to encephalopathy d/t urosepsis and AKI which  require 3+ hours per day of interdisciplinary therapy in a comprehensive inpatient rehab setting.  Physiatrist is providing close team supervision and 24 hour management of active medical problems listed below.  Physiatrist and rehab team continue to assess barriers to discharge/monitor patient progress toward functional and medical goals  Care Tool:  Bathing    Body parts bathed by patient: Right arm, Left arm, Chest, Abdomen, Right upper leg, Left upper leg, Right lower leg, Left lower leg, Face   Body parts bathed by helper: Front perineal area, Buttocks     Bathing assist Assist Level: Minimal Assistance - Patient > 75%     Upper Body Dressing/Undressing Upper body dressing   What is the patient wearing?: Pull over shirt    Upper body assist Assist Level: Minimal Assistance - Patient > 75%    Lower Body Dressing/Undressing Lower body dressing      What is the patient wearing?: Incontinence brief, Pants     Lower body assist Assist for lower body dressing: Minimal Assistance - Patient > 75%     Toileting Toileting    Toileting assist Assist for toileting: Moderate Assistance - Patient 50 - 74%     Transfers Chair/bed transfer  Transfers assist     Chair/bed transfer assist level: Minimal Assistance - Patient > 75%     Locomotion Ambulation   Ambulation assist   Ambulation activity did not occur: N/A          Walk 10 feet activity   Assist  Walk 10 feet  activity did not occur: N/A        Walk 50 feet activity   Assist Walk 50 feet with 2 turns activity did not occur: N/A         Walk 150 feet activity   Assist Walk 150 feet activity did not occur: N/A         Walk 10 feet on uneven surface  activity   Assist Walk 10 feet on uneven surfaces activity did not occur: N/A         Wheelchair     Assist Will patient use wheelchair at discharge?: Yes Type of Wheelchair: Manual    Wheelchair assist level:  Independent Max wheelchair distance: 150'    Wheelchair 50 feet with 2 turns activity    Assist        Assist Level: Independent   Wheelchair 150 feet activity     Assist      Assist Level: Independent   Blood pressure 116/73, pulse 69, temperature 97.8 F (36.6 C), temperature source Oral, resp. rate 18, weight 86 kg, SpO2 96 %.  Medical Problem List and Plan: 1.Functional deficitssecondary to encephalopathy d/t urosepsis, AKI in setting of T-10 myelopathy Continue CIR therapies today including PT and OT   Advised wife to perform daily gentle range of motion once patient returns home 2. Antithrombotics: -DVT/anticoagulation:Pharmaceutical:Lovenox -antiplatelet therapy: N/A 3. Pain Management:Tylenol prn 4. Mood:LCSW to follow for evaluation and support. -antipsychotic agents: N/a 5. Neuropsych: This patientis not fullycapable of making decisions on hisown behalf. -He's not too far from baseline at this point 6. Skin/Wound Care:Routine pressure relief measures. 7. Fluids/Electrolytes/Nutrition: good appetite! Labs stable 11/23 8. Acute renal failure: has resolved 26/1.43-->15/0.91.  9. Klebsiella UTI: ceftriaxone completed  -apparently a lot of sediment and "pus" when foley was removed   -wbc's 12.1 11/17 (increased from 8)--> back to 10 11/19  -ucx with 20k pseudomonas  -stopped cipro given it's potentiation of tizanidine's effects--appreciate pharmacy assistance. He received 3 doses in total--observe for any urinary sx 10. Seizures: Felt to be secondary to baclofen toxicity urosepsis--  Keppra 500 mg bid for now.    -question need to continue keppra long term. Have reached out to neurology without response yet. Will check again with neuro on Tuesday when I return. Can continue for now. 11. Anxiety disorder/tone:  Klonopin 1 mg bid per baseline 10. Paraplegia with spastic hemiplegia: OnTizanidine 4  mg qid, baclofen 20 mg ac/hs -received botox to left hip flexors, hip adductors for persistent tone LLE about 2 weeks ago.   Was experiencing improvement until developing the UTI -monitor for now 11. HTN: Monitor BP bid--off cozaar at this time.  11/23: Continues to have good BM control off Cozaar. Educated that he will not need to resume this medication at home if BP remains stable during his hospital stay.  12.Neurogenic bowel and bladder:remove foley and begin voiding trial. He was voiding continently at home per wife. I question whether he really needed to be cathed for larger PVR's given UTI --foley out -improved urinary continence, pvr's low -still having incontinent stools  -need to get back on daily dig stim program. Have discussed with pt/wife 13. Severe OSA: Continue CPAP whennapping/sleeping. 14. Diarrhea 2/2 antibiotics: bothersome to patient with 3-4 loose BM per day. Will start Imodium.  15. Osteoporosis: Given his diagnosis of osteoporosis (as per wife) and hip fracture with range of motion, he could benefit from being started on a bisphosphonate outpatient.  Continue Vitamin D and calcium supplementation  at home (added to his current inpatient regimen). Also advised wife regarding diet modifications--she is agreeable to incorporating daily greek yogurt with nuts for better bone health.  16. Disposition: Advised that patient can follow-up with physiatry post-discharge. Outpatient neurology had previously signed off so it would be ok to just follow-up with physiatry and only resume care with neurology if needed.     LOS: 9 days A FACE TO FACE EVALUATION WAS PERFORMED  David David Kim 10/01/2019, 10:03 AM

## 2019-10-01 NOTE — Progress Notes (Signed)
Social Work Discharge Note   The overall goal for the admission was met for:   Discharge location: Yes - returning home with wife who will continue to provide 24/7 assistance.  Length of Stay: Yes - 9 days  Discharge activity level: Yes - supervision/ min assist  Home/community participation: Yes  Services provided included: MD, RD, PT, OT, SLP, RN, Pharmacy and SW  Financial Services: Medicare and Private Insurance: [place]  Follow-up services arranged: Home Health: PT, OT via [facility] and Patient/Family has no preference for HH/DME agencies  Comments (or additional information):   contact person, wife, Tedrick [place] @ [phone]  Patient/Family verbalized understanding of follow-up arrangements: Yes  Individual responsible for coordination of the follow-up plan: pt  Confirmed correct DME delivered:  NA - had all needed DME    [name]

## 2019-10-01 NOTE — Discharge Summary (Signed)
Physician Discharge Summary  Patient ID: David Kim MRN: [id number] DOB/AGE: 07-Oct-1946 73 y.o.  Admit date: 09/22/2019 Discharge date: 10/01/2019  Discharge Diagnoses:  Principal Problem:   Physical debility Active Problems:   Paraplegia ([place])   Spastic hemiparesis of left nondominant side (HCC)   Spinal cord injury, thoracic region [facility])   New onset seizure ([place])   UTI due to Klebsiella species   Discharged Condition: stable   Significant Diagnostic Studies: N/A   Labs:  Basic Metabolic Panel: BMP Latest Ref Rng & Units 09/29/2019 09/23/2019 09/17/2019  Glucose 70 - 99 mg/dL 92 103(H) 92  BUN 8 - 23 mg/dL 18 15 15   Creatinine 0.61 - 1.24 mg/dL 0.85 0.80 0.91  Sodium 135 - 145 mmol/L 139 136 137  Potassium 3.5 - 5.1 mmol/L 3.8 4.0 3.8  Chloride 98 - 111 mmol/L 106 101 104  CO2 22 - 32 mmol/L 24 23 22   Calcium 8.9 - 10.3 mg/dL 8.7(L) 8.6(L) 8.2(L)    CBC: CBC Latest Ref Rng & Units 09/29/2019 09/25/2019 09/23/2019  WBC 4.0 - 10.5 K/uL 6.7 10.0 12.1(H)  Hemoglobin 13.0 - 17.0 g/dL 13.9 14.3 14.2  Hematocrit 39.0 - 52.0 % 43.8 44.5 43.7  Platelets 150 - 400 K/uL 366 432(H) 387    CBG: No results for input(s): GLUCAP in the last 168 hours.  Brief HPI:   David Kim is a 73 y.o. male with history of HTN, OSA, ependymoma resection with spastic paraplegia who was admitted on 09/15/2019 with 1 week history of lethargy progressing to AKI and seizure.  He was started on Keppra and treated with IV fluids for AKI.  Rocephin added due to Klebsiella UTI with improvement in mentation. Neurological mental status changes due to baclofen toxicity in setting of AKI and dose adjusted and Keppra discontinued at discharge as EEG negative for seizures.   Patient has had bouts of agitation with lethargy felt to be due to baclofen withdrawal therefore this was slowly titrated upwards.  Therapy evaluations completed revealing that patient has had decline in functional status.  CIR was  recommended for follow-up therapy   Hospital Course: David Kim was admitted to rehab 09/22/2019 for inpatient therapies to consist of PT, ST and OT at least three hours five days a week. Past admission physiatrist, therapy team and rehab RN have worked together to provide customized collaborative inpatient rehab.  He was maintained on 1 additional days of ceftriaxone to complete treatment of Klebsiella UTI.  His blood pressures were monitored on TID basis and is currently controlled without medications.  Follow-up labs shows leukocytosis has resolved and renal status is stable. Foley was removed past admission and bladder training was initiated.  He was noted to have purulent looking urine therefore urine culture repeated and showed 20,000 Pseudomonas.  He was treated with 3 doses of Cipro however this was discontinued due to concerns of potentiation of tizanidine effects by pharmacy.  Oxybutynin was added for hyperactive bladder and he is currently voiding without signs of retention and no reports of dysuria or other signs of infection. He has had issues with bowel incontinence and soft stools.  Probiotic was added and wife advised to add FiberCon additionally if incontinence does not improve.  Respiratory status is stable with use of CPAP at night times.  His anxiety is controlled on Klonopin twice daily. He has made gains during rehab stay and is currently supervision to min assist at wheelchair level. He will continue to receive follow up  HHPT and HHOT by [facility] after discharge   Rehab course: During patient's stay in rehab weekly team conferences were held to monitor patient's progress, set goals and discuss barriers to discharge. At admission, patient required max assist with mobility and ADL tasks. He exhibited mild to moderate cognitive linguistic deficits as well as signs of dysphagia requiring modified diet. He  has had improvement in activity tolerance, balance, postural control  as well as ability to compensate for deficits. He is able to complete ADL tasks at bed level with min assist and requires mod assist for pericare.  He requires min assist for bed mobility and transfers with use of SB.  He is tolerating regular textures with use of compensatory strategies.  He is able to complete functional and semicomplex tasks with occasional supervisory cues.  Cognition is at baseline and no further speech therapy recommended after discharge.  Family education completed with wife who will assist as needed.  Disposition:  Home  Diet: Regular.   Special Instructions: 1.  Continue bowel program daily--add FiberCon if loose stools continue   Discharge Instructions    Ambulatory referral to Physical Medicine Rehab   Complete by: As directed    1-2 weeks  TC appt     Allergies as of 10/01/2019      Reactions   Amoxicillin Other (See Comments)   Endal Hd Other (See Comments)      Medication List    STOP taking these medications   antiseptic oral rinse Liqd   CALCIUM-MAGNESIUM PO   losartan 50 MG tablet Commonly known as: COZAAR   OVER THE COUNTER MEDICATION   SECURA EXTRA PROTECTIVE EX     TAKE these medications   baclofen 20 MG tablet Commonly known as: LIORESAL Take 1 tablet (20 mg total) by mouth 4 (four) times daily.   bisacodyl 10 MG suppository Commonly known as: DULCOLAX Place 1 suppository (10 mg total) rectally daily after supper.   calcium citrate-vitamin D 500-500 MG-UNIT chewable tablet Chew 1 tablet by mouth daily.   cholecalciferol 25 MCG (1000 UT) tablet Commonly known as: VITAMIN D3 Take 1,000 Units by mouth daily.   clonazePAM 2 MG tablet Commonly known as: KLONOPIN Take 1 tablet (2 mg total) by mouth 2 (two) times daily. What changed:   how much to take  when to take this   Ecotrin Low Strength 81 MG EC tablet Generic drug: aspirin Take 81 mg by mouth daily.   loperamide 2 MG capsule Commonly known as: IMODIUM Take 1  capsule (2 mg total) by mouth as needed for diarrhea or loose stools.   multivitamin capsule Take 1 capsule by mouth daily.   oxybutynin 5 MG tablet Commonly known as: DITROPAN Take 5 mg by mouth 4 (four) times daily.   saccharomyces boulardii 250 MG capsule Commonly known as: FLORASTOR Take 1 capsule (250 mg total) by mouth 2 (two) times daily.   tiZANidine 4 MG tablet Commonly known as: ZANAFLEX Take 1 tablet (4 mg total) by mouth 4 (four) times daily. What changed:   how much to take  how to take this  when to take this  additional instructions   vitamin C 500 MG tablet Commonly known as: ASCORBIC ACID Take 500 mg by mouth daily.   vitamin E 400 UNIT capsule Generic drug: vitamin E Take 800 Units by mouth daily.      Follow-up Information    Meredith Staggers, MD Follow up.   Specialty: Physical Medicine and  Rehabilitation Why: office will call you with follow up appointment Contact information: [address] [place] [address] [phone]        Marcial Pacas, MD. Call.   Specialty: Neurology Why: for follow up appointment Contact information: [place] [address] [phone]        Biagio Borg, MD. Call on 10/06/2019.   Specialties: Internal Medicine, Radiology Why: for post hospital follow up Contact information: [place] [address] [phone]           Signed: Bary Leriche 10/06/2019, 4:45 PM

## 2019-10-01 NOTE — Discharge Instructions (Signed)
Inpatient Rehab Discharge Instructions  David Kim Discharge date and time:  10/01/19  Activities/Precautions/ Functional Status: Activity: activity as tolerated Diet: regular diet Wound Care: none needed    Functional status:  ___ No restrictions     ___ Walk up steps independently _X__ 24/7 supervision/assistance   ___ Walk up steps with assistance ___ Intermittent supervision/assistance  ___ Bathe/dress independently ___ Walk with walker     _X__ Bathe/dress with assistance ___ Walk Independently    ___ Shower independently ___ Walk with assistance    ___ Shower with assistance _X__ No alcohol     ___ Return to work/school ________    COMMUNITY REFERRALS UPON DISCHARGE:    Home Health:   PT     OT                     Agency:  [facility]    Phone: [phone]   Special Instructions:    My questions have been answered and I understand these instructions. I will adhere to these goals and the provided educational materials after my discharge from the hospital.  Patient/Caregiver Signature _______________________________ Date __________  Clinician Signature _______________________________________ Date __________  Please bring this form and your medication list with you to all your follow-up doctor's appointments.

## 2019-10-01 NOTE — Progress Notes (Signed)
Pt discharged to home with wife. Discharge instructions given by Reesa Chew, PA to wife and all questions and concerns were addressed. Pt transported to private vehicle by NT in Wheelchair.

## 2019-10-03 DIAGNOSIS — Z87440 Personal history of urinary (tract) infections: Secondary | ICD-10-CM | POA: Diagnosis not present

## 2019-10-03 DIAGNOSIS — Z7982 Long term (current) use of aspirin: Secondary | ICD-10-CM | POA: Diagnosis not present

## 2019-10-03 DIAGNOSIS — N319 Neuromuscular dysfunction of bladder, unspecified: Secondary | ICD-10-CM | POA: Diagnosis not present

## 2019-10-03 DIAGNOSIS — G8220 Paraplegia, unspecified: Secondary | ICD-10-CM | POA: Diagnosis not present

## 2019-10-03 DIAGNOSIS — G8114 Spastic hemiplegia affecting left nondominant side: Secondary | ICD-10-CM | POA: Diagnosis not present

## 2019-10-03 DIAGNOSIS — I10 Essential (primary) hypertension: Secondary | ICD-10-CM | POA: Diagnosis not present

## 2019-10-03 DIAGNOSIS — G5601 Carpal tunnel syndrome, right upper limb: Secondary | ICD-10-CM | POA: Diagnosis not present

## 2019-10-03 DIAGNOSIS — S24109D Unspecified injury at unspecified level of thoracic spinal cord, subsequent encounter: Secondary | ICD-10-CM | POA: Diagnosis not present

## 2019-10-03 DIAGNOSIS — J309 Allergic rhinitis, unspecified: Secondary | ICD-10-CM | POA: Diagnosis not present

## 2019-10-03 DIAGNOSIS — G4733 Obstructive sleep apnea (adult) (pediatric): Secondary | ICD-10-CM | POA: Diagnosis not present

## 2019-10-03 DIAGNOSIS — K592 Neurogenic bowel, not elsewhere classified: Secondary | ICD-10-CM | POA: Diagnosis not present

## 2019-10-03 DIAGNOSIS — Z85841 Personal history of malignant neoplasm of brain: Secondary | ICD-10-CM | POA: Diagnosis not present

## 2019-10-03 DIAGNOSIS — Z9089 Acquired absence of other organs: Secondary | ICD-10-CM | POA: Diagnosis not present

## 2019-10-03 DIAGNOSIS — Z8711 Personal history of peptic ulcer disease: Secondary | ICD-10-CM | POA: Diagnosis not present

## 2019-10-03 DIAGNOSIS — M109 Gout, unspecified: Secondary | ICD-10-CM | POA: Diagnosis not present

## 2019-10-03 DIAGNOSIS — K582 Mixed irritable bowel syndrome: Secondary | ICD-10-CM | POA: Diagnosis not present

## 2019-10-03 DIAGNOSIS — Z8546 Personal history of malignant neoplasm of prostate: Secondary | ICD-10-CM | POA: Diagnosis not present

## 2019-10-03 DIAGNOSIS — G9340 Encephalopathy, unspecified: Secondary | ICD-10-CM | POA: Diagnosis not present

## 2019-10-03 DIAGNOSIS — F329 Major depressive disorder, single episode, unspecified: Secondary | ICD-10-CM | POA: Diagnosis not present

## 2019-10-03 DIAGNOSIS — D497 Neoplasm of unspecified behavior of endocrine glands and other parts of nervous system: Secondary | ICD-10-CM | POA: Diagnosis not present

## 2019-10-03 DIAGNOSIS — K219 Gastro-esophageal reflux disease without esophagitis: Secondary | ICD-10-CM | POA: Diagnosis not present

## 2019-10-03 DIAGNOSIS — E291 Testicular hypofunction: Secondary | ICD-10-CM | POA: Diagnosis not present

## 2019-10-03 DIAGNOSIS — N529 Male erectile dysfunction, unspecified: Secondary | ICD-10-CM | POA: Diagnosis not present

## 2019-10-03 DIAGNOSIS — F419 Anxiety disorder, unspecified: Secondary | ICD-10-CM | POA: Diagnosis not present

## 2019-10-03 DIAGNOSIS — Z9981 Dependence on supplemental oxygen: Secondary | ICD-10-CM | POA: Diagnosis not present

## 2019-10-06 DIAGNOSIS — G9340 Encephalopathy, unspecified: Secondary | ICD-10-CM | POA: Diagnosis not present

## 2019-10-06 DIAGNOSIS — N390 Urinary tract infection, site not specified: Secondary | ICD-10-CM

## 2019-10-06 DIAGNOSIS — G8220 Paraplegia, unspecified: Secondary | ICD-10-CM | POA: Diagnosis not present

## 2019-10-06 DIAGNOSIS — G8114 Spastic hemiplegia affecting left nondominant side: Secondary | ICD-10-CM | POA: Diagnosis not present

## 2019-10-06 DIAGNOSIS — S24109D Unspecified injury at unspecified level of thoracic spinal cord, subsequent encounter: Secondary | ICD-10-CM | POA: Diagnosis not present

## 2019-10-06 DIAGNOSIS — I10 Essential (primary) hypertension: Secondary | ICD-10-CM | POA: Diagnosis not present

## 2019-10-06 DIAGNOSIS — D497 Neoplasm of unspecified behavior of endocrine glands and other parts of nervous system: Secondary | ICD-10-CM | POA: Diagnosis not present

## 2019-10-07 ENCOUNTER — Telehealth: Payer: Self-pay

## 2019-10-07 NOTE — Telephone Encounter (Signed)
Called to do Transitional Care Call and no answer, left voicemail with appt day time and location.

## 2019-10-08 DIAGNOSIS — G9340 Encephalopathy, unspecified: Secondary | ICD-10-CM | POA: Diagnosis not present

## 2019-10-08 DIAGNOSIS — D497 Neoplasm of unspecified behavior of endocrine glands and other parts of nervous system: Secondary | ICD-10-CM | POA: Diagnosis not present

## 2019-10-08 DIAGNOSIS — I10 Essential (primary) hypertension: Secondary | ICD-10-CM | POA: Diagnosis not present

## 2019-10-08 DIAGNOSIS — G8220 Paraplegia, unspecified: Secondary | ICD-10-CM | POA: Diagnosis not present

## 2019-10-08 DIAGNOSIS — S24109D Unspecified injury at unspecified level of thoracic spinal cord, subsequent encounter: Secondary | ICD-10-CM | POA: Diagnosis not present

## 2019-10-08 DIAGNOSIS — G8114 Spastic hemiplegia affecting left nondominant side: Secondary | ICD-10-CM | POA: Diagnosis not present

## 2019-10-09 ENCOUNTER — Encounter: Payer: Medicare Other | Attending: Physical Medicine & Rehabilitation | Admitting: Physical Medicine and Rehabilitation

## 2019-10-09 DIAGNOSIS — G8220 Paraplegia, unspecified: Secondary | ICD-10-CM | POA: Insufficient documentation

## 2019-10-09 DIAGNOSIS — G839 Paralytic syndrome, unspecified: Secondary | ICD-10-CM | POA: Insufficient documentation

## 2019-10-09 DIAGNOSIS — S24109S Unspecified injury at unspecified level of thoracic spinal cord, sequela: Secondary | ICD-10-CM | POA: Insufficient documentation

## 2019-10-09 DIAGNOSIS — G8114 Spastic hemiplegia affecting left nondominant side: Secondary | ICD-10-CM | POA: Insufficient documentation

## 2019-10-13 DIAGNOSIS — I10 Essential (primary) hypertension: Secondary | ICD-10-CM | POA: Diagnosis not present

## 2019-10-13 DIAGNOSIS — G9340 Encephalopathy, unspecified: Secondary | ICD-10-CM | POA: Diagnosis not present

## 2019-10-13 DIAGNOSIS — S24109D Unspecified injury at unspecified level of thoracic spinal cord, subsequent encounter: Secondary | ICD-10-CM | POA: Diagnosis not present

## 2019-10-13 DIAGNOSIS — G8114 Spastic hemiplegia affecting left nondominant side: Secondary | ICD-10-CM | POA: Diagnosis not present

## 2019-10-13 DIAGNOSIS — D497 Neoplasm of unspecified behavior of endocrine glands and other parts of nervous system: Secondary | ICD-10-CM | POA: Diagnosis not present

## 2019-10-13 DIAGNOSIS — G8220 Paraplegia, unspecified: Secondary | ICD-10-CM | POA: Diagnosis not present

## 2019-10-24 DIAGNOSIS — G8114 Spastic hemiplegia affecting left nondominant side: Secondary | ICD-10-CM | POA: Diagnosis not present

## 2019-10-24 DIAGNOSIS — S24109D Unspecified injury at unspecified level of thoracic spinal cord, subsequent encounter: Secondary | ICD-10-CM | POA: Diagnosis not present

## 2019-10-24 DIAGNOSIS — I10 Essential (primary) hypertension: Secondary | ICD-10-CM | POA: Diagnosis not present

## 2019-10-24 DIAGNOSIS — D497 Neoplasm of unspecified behavior of endocrine glands and other parts of nervous system: Secondary | ICD-10-CM | POA: Diagnosis not present

## 2019-10-24 DIAGNOSIS — G8220 Paraplegia, unspecified: Secondary | ICD-10-CM | POA: Diagnosis not present

## 2019-10-24 DIAGNOSIS — G9340 Encephalopathy, unspecified: Secondary | ICD-10-CM | POA: Diagnosis not present

## 2019-12-05 ENCOUNTER — Other Ambulatory Visit: Payer: Self-pay | Admitting: Physical Medicine & Rehabilitation

## 2019-12-11 ENCOUNTER — Telehealth: Payer: Self-pay | Admitting: Internal Medicine

## 2019-12-11 NOTE — Telephone Encounter (Signed)
Looking for plan of care and cert dated [id number] through 12/01/19.   Mariann Laster will fax to [facility] pod. Please be on the look out.

## 2019-12-17 ENCOUNTER — Ambulatory Visit: Payer: Medicare Other | Admitting: Physical Medicine & Rehabilitation

## 2020-01-02 ENCOUNTER — Ambulatory Visit: Payer: Medicare Other | Admitting: Internal Medicine

## 2020-01-08 ENCOUNTER — Ambulatory Visit: Payer: Medicare Other | Admitting: Internal Medicine

## 2020-01-09 ENCOUNTER — Other Ambulatory Visit: Payer: Self-pay

## 2020-01-09 ENCOUNTER — Ambulatory Visit (INDEPENDENT_AMBULATORY_CARE_PROVIDER_SITE_OTHER): Payer: Medicare Other | Admitting: Internal Medicine

## 2020-01-09 ENCOUNTER — Encounter: Payer: Self-pay | Admitting: Internal Medicine

## 2020-01-09 VITALS — BP 146/84 | HR 78 | Temp 99.1°F

## 2020-01-09 DIAGNOSIS — E785 Hyperlipidemia, unspecified: Secondary | ICD-10-CM

## 2020-01-09 DIAGNOSIS — E559 Vitamin D deficiency, unspecified: Secondary | ICD-10-CM

## 2020-01-09 DIAGNOSIS — R739 Hyperglycemia, unspecified: Secondary | ICD-10-CM

## 2020-01-09 DIAGNOSIS — R509 Fever, unspecified: Secondary | ICD-10-CM | POA: Diagnosis not present

## 2020-01-09 DIAGNOSIS — E538 Deficiency of other specified B group vitamins: Secondary | ICD-10-CM | POA: Diagnosis not present

## 2020-01-09 DIAGNOSIS — N320 Bladder-neck obstruction: Secondary | ICD-10-CM | POA: Diagnosis not present

## 2020-01-09 DIAGNOSIS — Z0000 Encounter for general adult medical examination without abnormal findings: Secondary | ICD-10-CM | POA: Insufficient documentation

## 2020-01-09 DIAGNOSIS — I10 Essential (primary) hypertension: Secondary | ICD-10-CM

## 2020-01-09 DIAGNOSIS — Z7189 Other specified counseling: Secondary | ICD-10-CM | POA: Insufficient documentation

## 2020-01-09 LAB — CBC WITH DIFFERENTIAL/PLATELET
Basophils Absolute: 0.1 10*3/uL (ref 0.0–0.1)
Basophils Relative: 0.7 % (ref 0.0–3.0)
Eosinophils Absolute: 0.7 10*3/uL (ref 0.0–0.7)
Eosinophils Relative: 8 % — ABNORMAL HIGH (ref 0.0–5.0)
HCT: 47.4 % (ref 39.0–52.0)
Hemoglobin: 15.9 g/dL (ref 13.0–17.0)
Lymphocytes Relative: 23.9 % (ref 12.0–46.0)
Lymphs Abs: 2 10*3/uL (ref 0.7–4.0)
MCHC: 33.5 g/dL (ref 30.0–36.0)
MCV: 88.1 fl (ref 78.0–100.0)
Monocytes Absolute: 0.7 10*3/uL (ref 0.1–1.0)
Monocytes Relative: 8.7 % (ref 3.0–12.0)
Neutro Abs: 4.8 10*3/uL (ref 1.4–7.7)
Neutrophils Relative %: 58.7 % (ref 43.0–77.0)
Platelets: 217 10*3/uL (ref 150.0–400.0)
RBC: 5.38 Mil/uL (ref 4.22–5.81)
RDW: 14.9 % (ref 11.5–15.5)
WBC: 8.2 10*3/uL (ref 4.0–10.5)

## 2020-01-09 LAB — LIPID PANEL
Cholesterol: 146 mg/dL (ref 0–200)
HDL: 39.8 mg/dL (ref >39.00)
NonHDL: 106.45
Total CHOL/HDL Ratio: 4
Triglycerides: 208 mg/dL — ABNORMAL HIGH (ref 0.0–149.0)
VLDL: 41.6 mg/dL — ABNORMAL HIGH (ref 0.0–40.0)

## 2020-01-09 LAB — URINALYSIS, ROUTINE W REFLEX MICROSCOPIC
Bilirubin Urine: NEGATIVE
Hgb urine dipstick: NEGATIVE
Ketones, ur: NEGATIVE
Leukocytes,Ua: NEGATIVE
Nitrite: NEGATIVE
Specific Gravity, Urine: 1.01 (ref 1.000–1.030)
Total Protein, Urine: NEGATIVE
Urine Glucose: NEGATIVE
Urobilinogen, UA: 0.2 (ref 0.0–1.0)
pH: 6.5 (ref 5.0–8.0)

## 2020-01-09 LAB — VITAMIN B12: Vitamin B-12: 240 pg/mL (ref 211–911)

## 2020-01-09 LAB — BASIC METABOLIC PANEL
BUN: 15 mg/dL (ref 6–23)
CO2: 26 mEq/L (ref 19–32)
Calcium: 9.4 mg/dL (ref 8.4–10.5)
Chloride: 106 mEq/L (ref 96–112)
Creatinine, Ser: 0.74 mg/dL (ref 0.40–1.50)
GFR: 103.99 mL/min (ref >60.00)
Glucose, Bld: 81 mg/dL (ref 70–99)
Potassium: 3.9 mEq/L (ref 3.5–5.1)
Sodium: 138 mEq/L (ref 135–145)

## 2020-01-09 LAB — PSA: PSA: 0.07 ng/mL — ABNORMAL LOW (ref 0.10–4.00)

## 2020-01-09 LAB — HEPATIC FUNCTION PANEL
ALT: 21 U/L (ref 0–53)
AST: 25 U/L (ref 0–37)
Albumin: 4.3 g/dL (ref 3.5–5.2)
Alkaline Phosphatase: 63 U/L (ref 39–117)
Bilirubin, Direct: 0.1 mg/dL (ref 0.0–0.3)
Total Bilirubin: 0.7 mg/dL (ref 0.2–1.2)
Total Protein: 6.9 g/dL (ref 6.0–8.3)

## 2020-01-09 LAB — LDL CHOLESTEROL, DIRECT: Direct LDL: 81 mg/dL

## 2020-01-09 LAB — VITAMIN D 25 HYDROXY (VIT D DEFICIENCY, FRACTURES): VITD: 27.16 ng/mL — ABNORMAL LOW (ref 30.00–100.00)

## 2020-01-09 LAB — TSH: TSH: 2.13 u[IU]/mL (ref 0.35–4.50)

## 2020-01-09 LAB — HEMOGLOBIN A1C: Hgb A1c MFr Bld: 5.1 % (ref 4.6–6.5)

## 2020-01-09 NOTE — Progress Notes (Signed)
Subjective:    Patient ID: David Kim, male    DOB: [birth year], 74 y.o.   MRN: [id number]  HPI  Here to f/u; overall doing ok,  Pt denies chest pain, increasing sob or doe, wheezing, orthopnea, PND, increased LE swelling, palpitations, dizziness or syncope.  Pt denies new neurological symptoms such as new headache, or facial or extremity weakness or numbness.  Pt denies polydipsia, polyuria, or low sugar episode.  Pt states overall good compliance with meds, mostly trying to follow appropriate diet, with wt overall stable,  but little exercise however.  Has low grade temp today but no fever, cough and Denies urinary symptoms such as dysuria, frequency, urgency, flank pain, hematuria or n/v, fever, chills. Past Medical History:  Diagnosis Date  . Allergic rhinitis   . ALLERGIC RHINITIS 10/01/2007   Qualifier: Diagnosis of  By: Jenny Reichmann MD, Hunt Oris   . Anxiety   . Carpal tunnel syndrome    right  . Depression   . Depression   . Erectile dysfunction   . Fatigue   . GERD (gastroesophageal reflux disease)   . Gout   . Hypertension   . HYPOGONADISM 03/31/2008   Qualifier: Diagnosis of  By: Jenny Reichmann MD, Hunt Oris   . IBS (irritable bowel syndrome)   . Low back pain   . OSA (obstructive sleep apnea)   . Paraplegia ([place]) 04/23/2009   Qualifier: Diagnosis of  By: Jenny Reichmann MD, Hunt Oris   . Peptic ulcer disease   . PEPTIC ULCER DISEASE 10/01/2007   Qualifier: Diagnosis of  By: Jenny Reichmann MD, Hunt Oris   . Prostate cancer [facility])   . PROSTATE CANCER, HX OF 05/23/2007   Qualifier: Diagnosis of  By: Jenny Reichmann MD, [place] PARALYSIS 04/23/2009   Qualifier: Diagnosis of  By: Jenny Reichmann MD, Hunt Oris   . Thoracic spinal cord injury ([place]) 03/12/2012   Past Surgical History:  Procedure Laterality Date  . ARTERY AND TENDON REPAIR Left 09/06/2018   Procedure: ARTERY AND TENDON REPAIR;  Surgeon: Charlotte Crumb, MD;  Location: [place];  Service: Orthopedics;  Laterality: Left;  . CAST APPLICATION Left [id number]   Procedure:  CAST APPLICATION;  Surgeon: Charlotte Crumb, MD;  Location: [place];  Service: Orthopedics;  Laterality: Left;  . inguinal heniorrhaphy    . left leg surgery after fibula    . NERVE REPAIR Left 09/06/2018   Procedure: NERVE REPAIR TIMES TWO;  Surgeon: Charlotte Crumb, MD;  Location: [place];  Service: Orthopedics;  Laterality: Left;  . OPEN REDUCTION INTERNAL FIXATION (ORIF) DISTAL PHALANX Left 09/06/2018   Procedure: OPEN REDUCTION INTERNAL FIXATION (ORIF) LEFT LONG FINGER;  Surgeon: Charlotte Crumb, MD;  Location: [place];  Service: Orthopedics;  Laterality: Left;  . PILONIDAL CYST / SINUS EXCISION    . PROSTATECTOMY    . tonsillectomy    . WOUND EXPLORATION Left 09/06/2018   Procedure: EXPLORATION OFCOMPLEX INJURY;  Surgeon: Charlotte Crumb, MD;  Location: [place];  Service: Orthopedics;  Laterality: Left;    reports that he has never smoked. He has never used smokeless tobacco. He reports that he does not drink alcohol or use drugs. family history includes Dementia in his mother; Diabetes in his father; High blood pressure in his mother. Allergies  Allergen Reactions  . Amoxicillin Other (See Comments)  . Endal Hd Other (See Comments)   Current Outpatient Medications on File Prior to Visit  Medication Sig Dispense Refill  . aspirin (ECOTRIN LOW STRENGTH) 81 MG  EC tablet Take 81 mg by mouth daily.      . baclofen (LIORESAL) 20 MG tablet Take 1 tablet (20 mg total) by mouth 4 (four) times daily. 360 tablet 4  . bisacodyl (DULCOLAX) 10 MG suppository Place 1 suppository (10 mg total) rectally daily after supper. 12 suppository 0  . calcium citrate-vitamin D 500-500 MG-UNIT chewable tablet Chew 1 tablet by mouth daily.    . cholecalciferol (VITAMIN D3) 25 MCG (1000 UT) tablet Take 1,000 Units by mouth daily.    . clonazePAM (KLONOPIN) 2 MG tablet Take 1 tablet (2 mg total) by mouth 2 (two) times daily. 180 tablet 3  . loperamide (IMODIUM) 2 MG capsule Take 1 capsule (2 mg total) by mouth as  needed for diarrhea or loose stools. 30 capsule 0  . Multiple Vitamin (MULTIVITAMIN) capsule Take 1 capsule by mouth daily.    Marland Kitchen oxybutynin (DITROPAN) 5 MG tablet Take 5 mg by mouth 4 (four) times daily.     Marland Kitchen saccharomyces boulardii (FLORASTOR) 250 MG capsule Take 1 capsule (250 mg total) by mouth 2 (two) times daily.    Marland Kitchen tiZANidine (ZANAFLEX) 4 MG tablet TAKE 1-2 TABLETS BY MOUTH FOUR TIMES DAILY 620 tablet 3  . vitamin C (ASCORBIC ACID) 500 MG tablet Take 500 mg by mouth daily.    . vitamin E (VITAMIN E) 400 UNIT capsule Take 800 Units by mouth daily.     No current facility-administered medications on file prior to visit.   Review of Systems All otherwise neg per pt    Objective:   Physical Exam BP (!) 146/84   Pulse 78   Temp 99.1 F (37.3 C)   SpO2 98%  VS noted,  Constitutional: Pt appears in NAD HENT: Head: NCAT.  Right Ear: External ear normal.  Left Ear: External ear normal.  Eyes: . Pupils are equal, round, and reactive to light. Conjunctivae and EOM are normal Nose: without d/c or deformity Neck: Neck supple. Gross normal ROM Cardiovascular: Normal rate and regular rhythm.   Pulmonary/Chest: Effort normal and breath sounds without rales or wheezing.  Abd:  Soft, NT, ND, + BS, no organomegaly Neurological: Pt is alert. At baseline orientation, UE motor 5/5, no sensation from below umbilicus and distal Skin: Skin is warm. No rashes, other new lesions, no LE edema Psychiatric: Pt behavior is normal without agitation  All otherwise neg per pt Lab Results  Component Value Date   WBC 8.2 01/09/2020   HGB 15.9 01/09/2020   HCT 47.4 01/09/2020   PLT 217.0 01/09/2020   GLUCOSE 81 01/09/2020   CHOL 146 01/09/2020   TRIG 208.0 (H) 01/09/2020   HDL 39.80 01/09/2020   LDLDIRECT 81.0 01/09/2020   LDLCALC 65 01/01/2019   ALT 21 01/09/2020   AST 25 01/09/2020   NA 138 01/09/2020   K 3.9 01/09/2020   CL 106 01/09/2020   CREATININE 0.74 01/09/2020   BUN 15 01/09/2020    CO2 26 01/09/2020   TSH 2.13 01/09/2020   PSA 0.07 (L) 01/09/2020   INR 0.99 10/06/2016   HGBA1C 5.1 01/09/2020      Assessment & Plan:

## 2020-01-09 NOTE — Patient Instructions (Signed)

## 2020-01-10 ENCOUNTER — Other Ambulatory Visit: Payer: Self-pay | Admitting: Internal Medicine

## 2020-01-10 ENCOUNTER — Encounter: Payer: Self-pay | Admitting: Internal Medicine

## 2020-01-10 DIAGNOSIS — R509 Fever, unspecified: Secondary | ICD-10-CM | POA: Insufficient documentation

## 2020-01-10 MED ORDER — VITAMIN D (ERGOCALCIFEROL) 1.25 MG (50000 UNIT) PO CAPS: 50000.0000 [IU] | ORAL_CAPSULE | ORAL | 0 refills | Status: DC

## 2020-01-10 NOTE — Assessment & Plan Note (Signed)
stable overall by history and exam, recent data reviewed with pt, and pt to continue medical treatment as before,  to f/u any worsening symptoms or concerns, BP at home , 140/90 per wife

## 2020-01-10 NOTE — Assessment & Plan Note (Addendum)
stable overall by history and exam, recent data reviewed with pt, and pt to continue medical treatment as before,  to f/u any worsening symptoms or concerns  I spent 31 minutes in preparing to see the patient by review of recent labs, imaging and procedures, obtaining and reviewing separately obtained history, communicating with the patient and family or caregiver, ordering medications, tests or procedures, and documenting clinical information in the EHR including the differential Dx, treatment, and any further evaluation and other management of hyperglycemia, HLD, HTN, low grade fever

## 2020-01-10 NOTE — Assessment & Plan Note (Signed)
stable overall by history and exam, recent data reviewed with pt, and pt to continue medical treatment as before,  to f/u any worsening symptoms or concerns  

## 2020-01-10 NOTE — Assessment & Plan Note (Signed)
Exam benign, no overt symptoms or source infection, for labs, declines cxr

## 2020-01-28 ENCOUNTER — Encounter: Payer: Medicare Other | Admitting: Physical Medicine & Rehabilitation

## 2020-02-23 ENCOUNTER — Ambulatory Visit: Payer: Medicare Other | Admitting: Internal Medicine

## 2020-03-10 ENCOUNTER — Encounter: Payer: Self-pay | Admitting: Internal Medicine

## 2020-03-10 ENCOUNTER — Encounter: Payer: Self-pay | Admitting: Physical Medicine & Rehabilitation

## 2020-03-10 ENCOUNTER — Other Ambulatory Visit: Payer: Self-pay

## 2020-03-10 ENCOUNTER — Encounter: Payer: Medicare Other | Attending: Physical Medicine & Rehabilitation | Admitting: Physical Medicine & Rehabilitation

## 2020-03-10 VITALS — BP 120/72 | HR 82 | Temp 98.9°F | Ht 68.0 in | Wt 180.0 lb

## 2020-03-10 DIAGNOSIS — G8114 Spastic hemiplegia affecting left nondominant side: Secondary | ICD-10-CM

## 2020-03-10 DIAGNOSIS — G8220 Paraplegia, unspecified: Secondary | ICD-10-CM | POA: Insufficient documentation

## 2020-03-10 NOTE — Patient Instructions (Addendum)
PLEASE FEEL FREE TO CALL OUR OFFICE WITH ANY PROBLEMS OR QUESTIONS [phone])   Hydrocortisone cream  Any Cream for eczema

## 2020-03-10 NOTE — Progress Notes (Signed)
Subjective:    Patient ID: David Kim, male    DOB: 1946/05/18, 74 y.o.   MRN: [id number]  HPI  [name] is here in follow up of his thoracic myelopathy. I last saw him in the Fall after being hospitalized for urosepsis/encephalopathy. We have been performing botox injections for his lower extremity spasticity. We last performed these in November to his quads and hip adductors. They were helping somewhat before he got sick.   At home he has been working on ROM. He hasn't done as much as he used to do and wants to get back in to a routine. Legs are tight at the knees.         Pain Inventory Average Pain 0 Pain Right Now 0 My pain is na  In the last 24 hours, has pain interfered with the following? General activity 0 Relation with others 0 Enjoyment of life 0 What TIME of day is your pain at its worst? na Sleep (in general) Fair  Pain is worse with: na Pain improves with: na Relief from Meds: na  Mobility ability to climb steps?  no do you drive?  no use a wheelchair transfers alone  Function not employed: date last employed . disabled: date disabled . I need assistance with the following:  bathing, toileting, meal prep, household duties and shopping  Neuro/Psych bladder control problems weakness numbness trouble walking spasms  Prior Studies Any changes since last visit?  no  Physicians involved in your care Primary care . Neurologist . Neurosurgeon .Marland Kitchen    Family History  Problem Relation Age of Onset  . High blood pressure Mother   . Dementia Mother   . Diabetes Father    Social History   Socioeconomic History  . Marital status: Married    Spouse name: David Kim  . Number of children: 0  . Years of education: 86  . Highest education level: Not on file  Occupational History  . Occupation: retired    Fish farm manager: RETIRED  Tobacco Use  . Smoking status: Never Smoker  . Smokeless tobacco: Never Used  Substance and Sexual Activity  . Alcohol  use: No  . Drug use: No  . Sexual activity: Not on file  Other Topics Concern  . Not on file  Social History Narrative   Patient lives at home with his wife David Kim). Patient is disabled. Patient has 12 th grade education.   Right handed.   Caffeine- None   Social Determinants of Health   Financial Resource Strain:   . Difficulty of Paying Living Expenses:   Food Insecurity:   . Worried About Charity fundraiser in the Last Year:   . Arboriculturist in the Last Year:   Transportation Needs:   . Film/video editor (Medical):   Marland Kitchen Lack of Transportation (Non-Medical):   Physical Activity:   . Days of Exercise per Week:   . Minutes of Exercise per Session:   Stress:   . Feeling of Stress :   Social Connections:   . Frequency of Communication with Friends and Family:   . Frequency of Social Gatherings with Friends and Family:   . Attends Religious Services:   . Active Member of Clubs or Organizations:   . Attends Archivist Meetings:   Marland Kitchen Marital Status:    Past Surgical History:  Procedure Laterality Date  . ARTERY AND TENDON REPAIR Left 09/06/2018   Procedure: ARTERY AND TENDON REPAIR;  Surgeon: Charlotte Crumb, MD;  Location: [facility];  Service: Orthopedics;  Laterality: Left;  . CAST APPLICATION Left [id number]   Procedure: CAST APPLICATION;  Surgeon: Charlotte Crumb, MD;  Location: [place];  Service: Orthopedics;  Laterality: Left;  . inguinal heniorrhaphy    . left leg surgery after fibula    . NERVE REPAIR Left 09/06/2018   Procedure: NERVE REPAIR TIMES TWO;  Surgeon: Charlotte Crumb, MD;  Location: [place];  Service: Orthopedics;  Laterality: Left;  . OPEN REDUCTION INTERNAL FIXATION (ORIF) DISTAL PHALANX Left 09/06/2018   Procedure: OPEN REDUCTION INTERNAL FIXATION (ORIF) LEFT LONG FINGER;  Surgeon: Charlotte Crumb, MD;  Location: [place];  Service: Orthopedics;  Laterality: Left;  . PILONIDAL CYST / SINUS EXCISION    . PROSTATECTOMY    . tonsillectomy    .  WOUND EXPLORATION Left 09/06/2018   Procedure: EXPLORATION OFCOMPLEX INJURY;  Surgeon: Charlotte Crumb, MD;  Location: [place];  Service: Orthopedics;  Laterality: Left;   Past Medical History:  Diagnosis Date  . Allergic rhinitis   . ALLERGIC RHINITIS 10/01/2007   Qualifier: Diagnosis of  By: Jenny Reichmann MD, Hunt Oris   . Anxiety   . Carpal tunnel syndrome    right  . Depression   . Depression   . Erectile dysfunction   . Fatigue   . GERD (gastroesophageal reflux disease)   . Gout   . Hypertension   . HYPOGONADISM 03/31/2008   Qualifier: Diagnosis of  By: Jenny Reichmann MD, Hunt Oris   . IBS (irritable bowel syndrome)   . Low back pain   . OSA (obstructive sleep apnea)   . Paraplegia ([place]) 04/23/2009   Qualifier: Diagnosis of  By: Jenny Reichmann MD, Hunt Oris   . Peptic ulcer disease   . PEPTIC ULCER DISEASE 10/01/2007   Qualifier: Diagnosis of  By: Jenny Reichmann MD, Hunt Oris   . Prostate cancer [facility])   . PROSTATE CANCER, HX OF 05/23/2007   Qualifier: Diagnosis of  By: Jenny Reichmann MD, [place] PARALYSIS 04/23/2009   Qualifier: Diagnosis of  By: Jenny Reichmann MD, Hunt Oris   . Thoracic spinal cord injury ([place]) 03/12/2012   BP 120/72   Pulse 82   Temp 98.9 F (37.2 C)   Ht 5\' 8"  (1.727 m) Comment: in wheelchair, pt reported  Wt 180 lb (81.6 kg) Comment: in wheelchair, pt reported  SpO2 92%   BMI 27.37 kg/m   Opioid Risk Score:   Fall Risk Score:  `1  Depression screen PHQ 2/9  Depression screen [facility] 2/9 01/09/2020 07/02/2019 01/01/2019 03/29/2018 10/03/2017 03/29/2017 09/27/2016  Decreased Interest 0 0 0 0 0 0 0  Down, Depressed, Hopeless 1 1 1 1  0 0 0  PHQ - 2 Score 1 1 1 1  0 0 0    Review of Systems  Gastrointestinal: Positive for constipation.  Genitourinary:       Urine retention  Musculoskeletal: Positive for gait problem.  Neurological: Positive for weakness and numbness.       Spasms  All other systems reviewed and are negative.      Objective:   Physical Exam  Gen: no distress, normal appearing HEENT:  oral mucosa pink and moist, NCAT Cardio: Reg rate Chest: normal effort, normal rate of breathing Abd: soft, non-distended Ext: no edema Skin: intact, small area of eczema in right flank Neuro: LE spastic in HAD, hamstrings. 15 degree flexion contractures.  Musculoskeletal: crepitus at both knees.  Psych: pleasant, normal affect       Assessment & Plan:  Medical Problem List and Plan: 1.  Spastic paraplegia d/t thoracic myelopathy  -due for a new wheelchair soon. Will need FTF for this 2  spastic hemiplegia: On Tizanidine 4 mg qid, baclofen 20 mg ac/hs  -no RF today             -no further botox at this time                         -monitor for now 3 Neurogenic bowel and bladder:    I/O prn, is incontinent otherwise.    Fifteen minutes of face to face patient care time were spent during this visit. All questions were encouraged and answered.  Follow up with me in 12 mos .

## 2020-03-11 ENCOUNTER — Ambulatory Visit (INDEPENDENT_AMBULATORY_CARE_PROVIDER_SITE_OTHER): Payer: Medicare Other | Admitting: Internal Medicine

## 2020-03-11 ENCOUNTER — Encounter: Payer: Self-pay | Admitting: Internal Medicine

## 2020-03-11 DIAGNOSIS — G8114 Spastic hemiplegia affecting left nondominant side: Secondary | ICD-10-CM | POA: Diagnosis not present

## 2020-03-11 DIAGNOSIS — G4733 Obstructive sleep apnea (adult) (pediatric): Secondary | ICD-10-CM | POA: Diagnosis not present

## 2020-03-11 NOTE — Patient Instructions (Signed)
We can continue CPAP auto 5-15, mask of choice, humidifier, supplies, [facility]/ card  Please call [employer] about service for your wheel chair and for problems with your CPAP hose.   Please call us if we can help

## 2020-03-11 NOTE — Assessment & Plan Note (Signed)
Hosp with seizure related to Baclofen withdrawal. Managed by Neurology.

## 2020-03-11 NOTE — Progress Notes (Signed)
Subjective:    Patient ID: David Kim, male    DOB: 06/25/1946, 74 y.o.   MRN: [id number]  HPI   male followed for OSA/Insomnia, complicated by paraplegia after excision spinal cord tumor, GERD, HBP, GERD NPSG 2003, AHI 31 / hr -----------------------------------------------------------------------------------------   03/10/2019- Virtual Visit via Telephone Note  History of Present Illness:  74 year old male never smoker followed for OSA /Insomnia, complicated by paraplegia after spinal tumor resected, GERD, HBP,  CPAP  auto 5-15/ [employer] Download 77% compliance, AHI 1.6/ hr [employer] replace his CPAP. Needs updated supplies. Asks about a filter in his mask- not sure what he means and he will discuss with [employer].  Never got wheelchair arms repaired. Up to him to find time to take it in.  Observations/Objective: Download 100% compliance, AHI 3/ hr  Assessment and Plan: Benefits from CPAP. Most problems relate to paraplegia.  Continue auto 5-15, replace mask, hoses, supplies  Follow Up Instructions: 1 year   03/11/20-  75 year old male never smoker followed for OSA /Insomnia, complicated by Paraplegia/ Spastic L hemiparesis  after spinal tumor resected, Seizure, GERD, HBP,  CPAP  auto 5-15/ [employer] Download compliance 87%, AHI 1.3/ hr -----f/u OSA. Body weight today 180 lbs Wife here today. Wheelchair getting worn. Hosp last winter with seizure. Doing well with CPAP except hose keeps disconnecting from mask. Had 2 Phizer Covax.  Review of Systems   + = positive Constitutional: Negative for fever and unexpected weight change.  HENT: Negative for congestion, dental problem, ear pain, nosebleeds, postnasal drip, rhinorrhea, sinus pressure, sneezing, sore throat and trouble swallowing.   Eyes: Negative for redness and itching.  Respiratory: Negative for cough, chest tightness, shortness of breath and wheezing.   Cardiovascular: Negative for palpitations, + leg swelling.   Gastrointestinal: Negative for nausea and vomiting.  Genitourinary: Negative for dysuria.  Musculoskeletal: Negative for joint swelling.  Skin: Negative for rash.  Neurological: Negative for headaches.  Hematological: Does not bruise/bleed easily.  Psychiatric/Behavioral: Negative for dysphoric mood. The patient is not nervous/anxious.    Objective:  OBJ- Physical Exam General- Alert, Oriented, Affect-appropriate, Distress- none acute, + Wheelchair, + obese Skin- rash-none, lesions- none, excoriation- none Lymphadenopathy- none Head- atraumatic            Eyes- Gross vision intact, PERRLA, conjunctivae and secretions clear            Ears- Hearing, canals-normal            Nose- Clear, no-Septal dev, mucus, polyps, erosion, perforation             Throat- Mallampati II , mucosa clear , drainage- none, tonsils- atrophic, + missing teeth                   neck- flexible , trachea midline, no stridor , thyroid nl, carotid no bruit Chest - symmetrical excursion , unlabored           Heart/CV- RRR , no murmur , no gallop  , no rub, nl s1 s2                           - JVD- none , edema-+2, R>L, stasis changes- none, varices- none           Lung- clear to P&A, wheeze- none, cough- none , dullness-none, rub- none           Chest wall-  Abd-  Br/ Gen/ Rectal- Not done, not indicated  Extrem- cyanosis- none, clubbing, none, atrophy- none, strength- nl Neuro- +paraplegic   Baird Lyons, MD

## 2020-03-11 NOTE — Assessment & Plan Note (Signed)
Benefits from CPAP with good compliance and control. He will discuss loose hose connection with [employer].  Plan- continue auto 5-15

## 2020-06-08 ENCOUNTER — Other Ambulatory Visit: Payer: Self-pay | Admitting: Physical Medicine & Rehabilitation

## 2020-06-08 DIAGNOSIS — S24109S Unspecified injury at unspecified level of thoracic spinal cord, sequela: Secondary | ICD-10-CM

## 2020-06-08 DIAGNOSIS — G8220 Paraplegia, unspecified: Secondary | ICD-10-CM

## 2020-06-08 DIAGNOSIS — G839 Paralytic syndrome, unspecified: Secondary | ICD-10-CM

## 2020-06-08 NOTE — Telephone Encounter (Signed)
Clonazepam refill. Please refill or advise.

## 2020-06-15 ENCOUNTER — Other Ambulatory Visit: Payer: Self-pay | Admitting: Internal Medicine

## 2020-06-15 NOTE — Telephone Encounter (Signed)
Please refill as per office routine med refill policy (all routine meds refilled for 3 mo or monthly per pt preference up to one year from last visit, then month to month grace period for 3 mo, then further med refills will have to be denied)  

## 2020-08-18 IMAGING — MR MR HEAD W/O CM
10 series · 48 of 48 positions shown · non-contrast
Comparison: None.

CLINICAL DATA: Confusion and seizure activity.

EXAM:
MRI HEAD WITHOUT CONTRAST
TECHNIQUE: Multiplanar, multiecho pulse sequences of the brain and surrounding
structures were obtained without intravenous contrast.

[Series 5: DWI · axial · 3.0mm · 0.88mm/px · z∈[-77,+63]mm · 12 of 96 slices shown (1 of 4)]
[im 1/96]
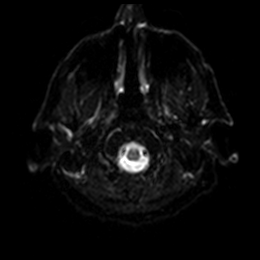
[im 9/96]
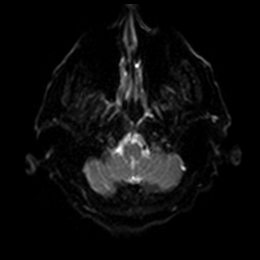
[im 18/96]
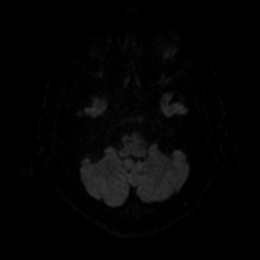
[im 26/96]
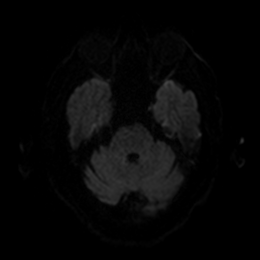
[im 35/96]
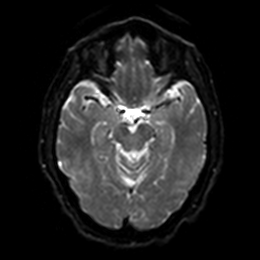
[im 44/96]
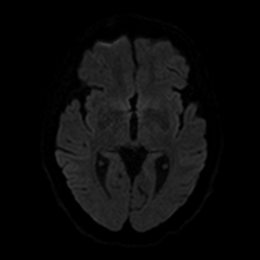
[im 52/96]
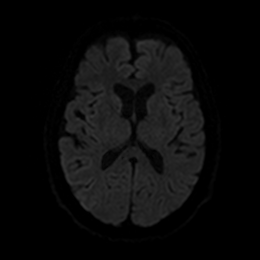
[im 61/96]
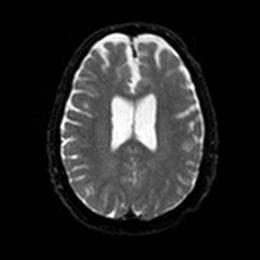
[im 70/96]
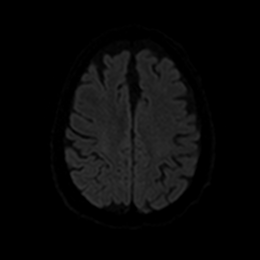
[im 78/96]
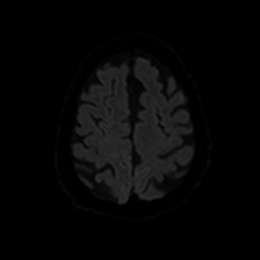
[im 87/96]
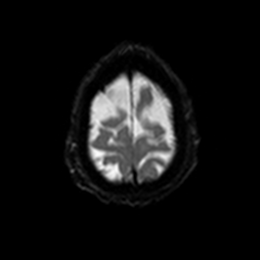
[im 96/96]
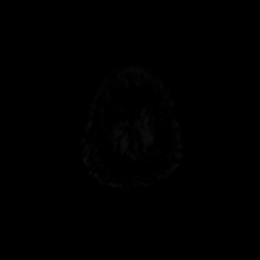

[Series 6: DWI · axial · 3.0mm · 0.88mm/px · z∈[-77,+63]mm · 6 of 48 slices shown (2 of 4)]
[im 1/48]
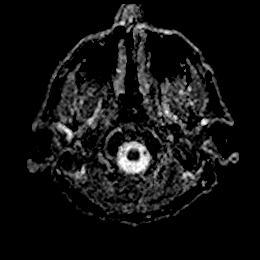
[im 10/48]
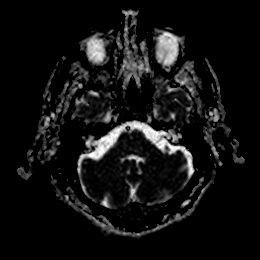
[im 19/48]
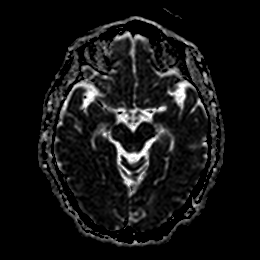
[im 29/48]
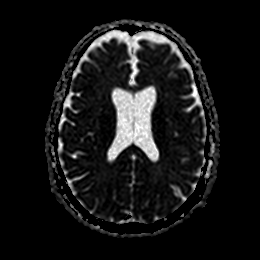
[im 38/48]
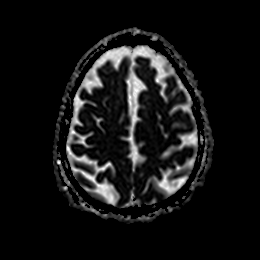
[im 48/48]
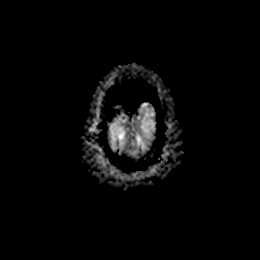

[Series 7: DWI · coronal · 4.0mm · 0.88mm/px · 8 of 70 slices shown (3 of 4)]
[im 1/70]
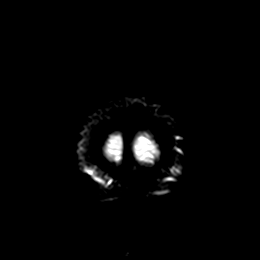
[im 10/70]
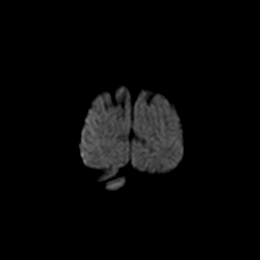
[im 20/70]
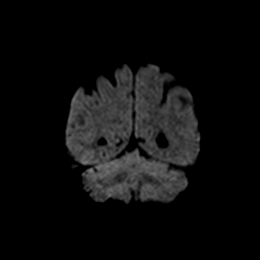
[im 30/70]
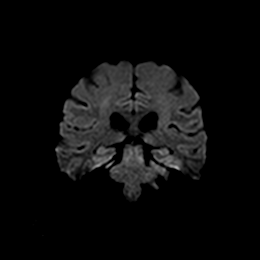
[im 40/70]
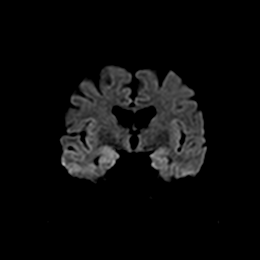
[im 50/70]
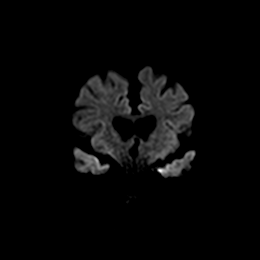
[im 60/70]
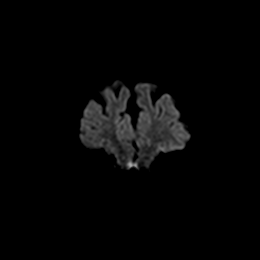
[im 70/70]
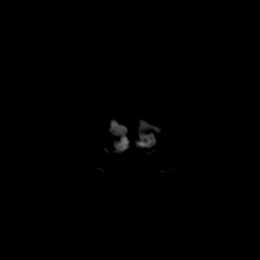

[Series 8: DWI · coronal · 4.0mm · 0.88mm/px · 4 of 35 slices shown (4 of 4)]
[im 1/35]
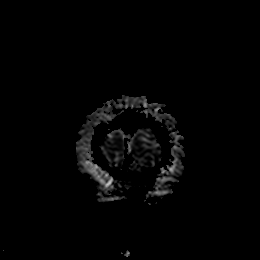
[im 12/35]
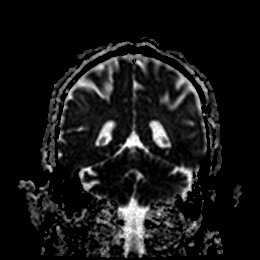
[im 23/35]
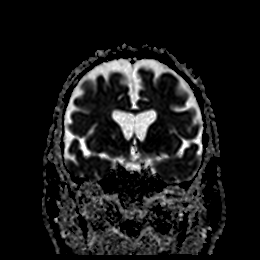
[im 35/35]
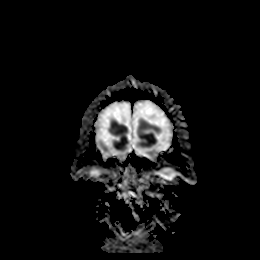

[Series 9: T1 · sagittal · 5.0mm · 0.75mm/px · 3 of 25 slices shown]
[im 1/25]
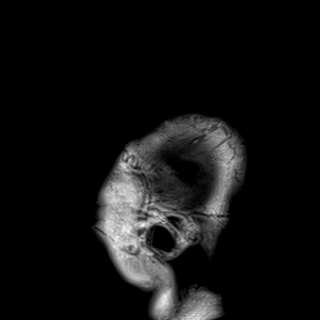
[im 13/25]
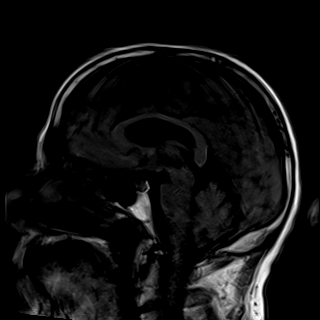
[im 25/25]
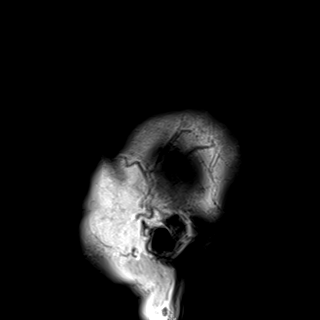

[Series 10: T2 · axial · 5.0mm · 0.72mm/px · z∈[-79,+64]mm · 3 of 25 slices shown (1 of 2)]
[im 1/25]
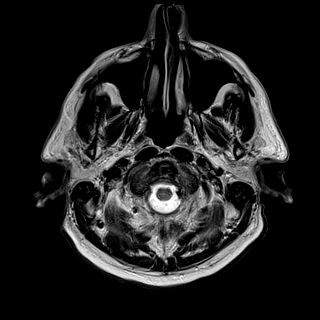
[im 13/25]
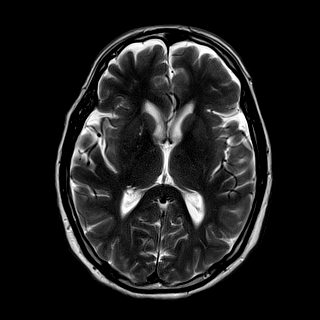
[im 25/25]
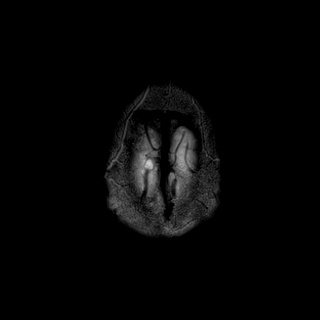

[Series 11: FLAIR · axial · 5.0mm · 0.90mm/px · z∈[-79,+64]mm · 3 of 25 slices shown (1 of 2)]
[im 1/25]
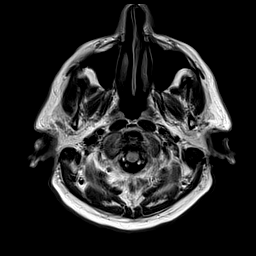
[im 13/25]
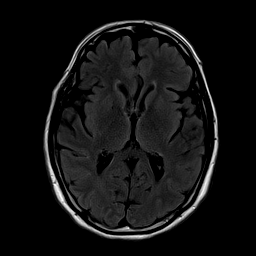
[im 25/25]
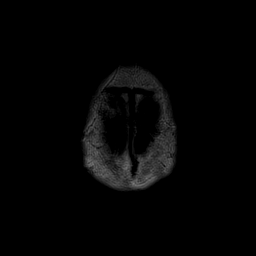

[Series 12: ax hemo · axial · 5.0mm · 0.86mm/px · z∈[-77,+65]mm · 3 of 25 slices shown]
[im 1/25]
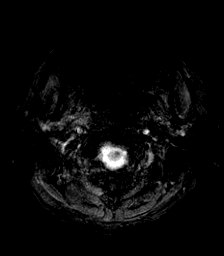
[im 13/25]
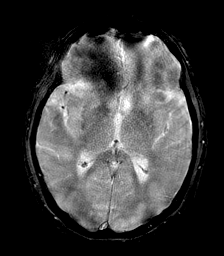
[im 25/25]
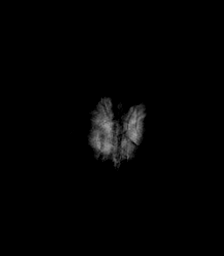

[Series 13: FLAIR · axial · 5.0mm · 0.90mm/px · z∈[-79,+64]mm · 3 of 25 slices shown (2 of 2)]
[im 1/25]
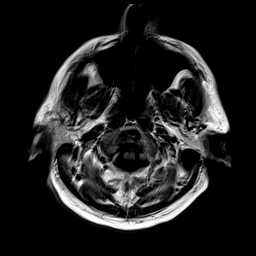
[im 13/25]
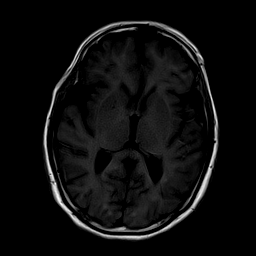
[im 25/25]
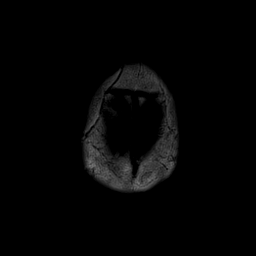

[Series 14: T2 · coronal · 5.0mm · 0.72mm/px · 3 of 28 slices shown (2 of 2)]
[im 1/28]
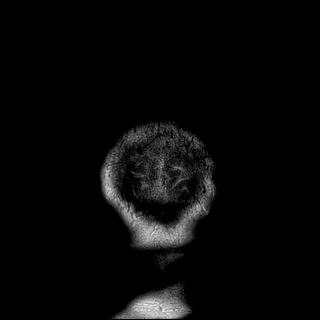
[im 14/28]
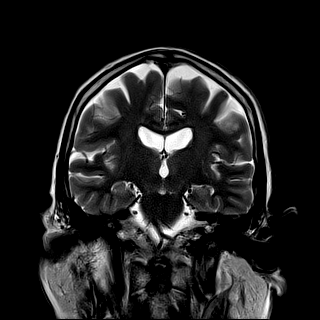
[im 28/28]
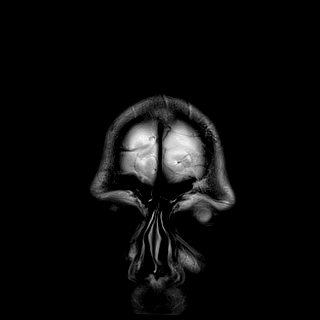

[48 of 48 positions shown; findings below may reference images not displayed]

FINDINGS: BRAIN: There is no acute infarct, acute hemorrhage or extra-axial
collection. The white matter signal is normal for the patient's age.
The cerebral and cerebellar volume are age-appropriate. There is no
hydrocephalus. The midline structures are normal.

VASCULAR: The major intracranial arterial and venous sinus flow
voids are normal. Susceptibility-sensitive sequences show no chronic
microhemorrhage or superficial siderosis.

SKULL AND UPPER CERVICAL SPINE: Calvarial bone marrow signal is
normal. There is no skull base mass. The visualized upper cervical
spine and soft tissues are normal.

SINUSES/ORBITS: There are no fluid levels or advanced mucosal
thickening. The mastoid air cells and middle ear cavities are free
of fluid. The orbits are normal.
IMPRESSION: Normal aging brain.

## 2020-09-08 ENCOUNTER — Telehealth: Payer: Self-pay

## 2020-09-08 ENCOUNTER — Other Ambulatory Visit: Payer: Self-pay | Admitting: Internal Medicine

## 2020-09-08 DIAGNOSIS — G839 Paralytic syndrome, unspecified: Secondary | ICD-10-CM

## 2020-09-08 DIAGNOSIS — G8220 Paraplegia, unspecified: Secondary | ICD-10-CM

## 2020-09-08 DIAGNOSIS — S24109S Unspecified injury at unspecified level of thoracic spinal cord, sequela: Secondary | ICD-10-CM

## 2020-09-08 MED ORDER — CLONAZEPAM 2 MG PO TABS: ORAL_TABLET | ORAL | 1 refills | Status: DC

## 2020-09-08 NOTE — Telephone Encounter (Signed)
Klonopin refilled.

## 2020-09-08 NOTE — Telephone Encounter (Signed)
Patient called requesting refill on Clonzapem

## 2020-09-08 NOTE — Telephone Encounter (Signed)
Please refill as per office routine med refill policy (all routine meds refilled for 3 mo or monthly per pt preference up to one year from last visit, then month to month grace period for 3 mo, then further med refills will have to be denied)  

## 2020-09-29 DIAGNOSIS — C61 Malignant neoplasm of prostate: Secondary | ICD-10-CM | POA: Diagnosis not present

## 2020-10-06 DIAGNOSIS — N3946 Mixed incontinence: Secondary | ICD-10-CM | POA: Diagnosis not present

## 2020-10-06 DIAGNOSIS — N200 Calculus of kidney: Secondary | ICD-10-CM | POA: Diagnosis not present

## 2020-10-06 DIAGNOSIS — Z8546 Personal history of malignant neoplasm of prostate: Secondary | ICD-10-CM | POA: Diagnosis not present

## 2020-10-06 DIAGNOSIS — N3281 Overactive bladder: Secondary | ICD-10-CM | POA: Diagnosis not present

## 2020-11-11 ENCOUNTER — Other Ambulatory Visit: Payer: Self-pay | Admitting: Neurology

## 2020-11-11 ENCOUNTER — Other Ambulatory Visit: Payer: Self-pay | Admitting: Internal Medicine

## 2020-11-11 NOTE — Telephone Encounter (Signed)
Please refill as per office routine med refill policy (all routine meds refilled for 3 mo or monthly per pt preference up to one year from last visit, then month to month grace period for 3 mo, then further med refills will have to be denied)  

## 2021-01-06 ENCOUNTER — Other Ambulatory Visit: Payer: Self-pay | Admitting: Physical Medicine & Rehabilitation

## 2021-01-10 ENCOUNTER — Ambulatory Visit: Payer: Medicare Other | Admitting: Internal Medicine

## 2021-01-19 ENCOUNTER — Encounter: Payer: Self-pay | Admitting: Internal Medicine

## 2021-01-19 ENCOUNTER — Other Ambulatory Visit: Payer: Self-pay

## 2021-01-19 ENCOUNTER — Ambulatory Visit (INDEPENDENT_AMBULATORY_CARE_PROVIDER_SITE_OTHER): Payer: Medicare Other | Admitting: Internal Medicine

## 2021-01-19 VITALS — BP 110/64 | HR 76 | Temp 98.3°F

## 2021-01-19 DIAGNOSIS — E538 Deficiency of other specified B group vitamins: Secondary | ICD-10-CM

## 2021-01-19 DIAGNOSIS — E559 Vitamin D deficiency, unspecified: Secondary | ICD-10-CM

## 2021-01-19 DIAGNOSIS — R739 Hyperglycemia, unspecified: Secondary | ICD-10-CM

## 2021-01-19 DIAGNOSIS — I10 Essential (primary) hypertension: Secondary | ICD-10-CM | POA: Diagnosis not present

## 2021-01-19 DIAGNOSIS — N320 Bladder-neck obstruction: Secondary | ICD-10-CM | POA: Diagnosis not present

## 2021-01-19 DIAGNOSIS — E7800 Pure hypercholesterolemia, unspecified: Secondary | ICD-10-CM | POA: Diagnosis not present

## 2021-01-19 LAB — URINALYSIS, ROUTINE W REFLEX MICROSCOPIC
Bilirubin Urine: NEGATIVE
Hgb urine dipstick: NEGATIVE
Ketones, ur: NEGATIVE
Leukocytes,Ua: NEGATIVE
Nitrite: NEGATIVE
RBC / HPF: NONE SEEN (ref 0–?)
Specific Gravity, Urine: 1.025 (ref 1.000–1.030)
Total Protein, Urine: NEGATIVE
Urine Glucose: NEGATIVE
Urobilinogen, UA: 0.2 (ref 0.0–1.0)
WBC, UA: NONE SEEN (ref 0–?)
pH: 6 (ref 5.0–8.0)

## 2021-01-19 LAB — BASIC METABOLIC PANEL
BUN: 15 mg/dL (ref 6–23)
CO2: 30 mEq/L (ref 19–32)
Calcium: 9.1 mg/dL (ref 8.4–10.5)
Chloride: 103 mEq/L (ref 96–112)
Creatinine, Ser: 0.87 mg/dL (ref 0.40–1.50)
GFR: 85.93 mL/min (ref >60.00)
Glucose, Bld: 80 mg/dL (ref 70–99)
Potassium: 3.6 mEq/L (ref 3.5–5.1)
Sodium: 139 mEq/L (ref 135–145)

## 2021-01-19 LAB — HEPATIC FUNCTION PANEL
ALT: 36 U/L (ref 0–53)
AST: 35 U/L (ref 0–37)
Albumin: 4.2 g/dL (ref 3.5–5.2)
Alkaline Phosphatase: 61 U/L (ref 39–117)
Bilirubin, Direct: 0.1 mg/dL (ref 0.0–0.3)
Total Bilirubin: 0.7 mg/dL (ref 0.2–1.2)
Total Protein: 6.6 g/dL (ref 6.0–8.3)

## 2021-01-19 LAB — CBC WITH DIFFERENTIAL/PLATELET
Basophils Absolute: 0.1 10*3/uL (ref 0.0–0.1)
Basophils Relative: 0.6 % (ref 0.0–3.0)
Eosinophils Absolute: 0.7 10*3/uL (ref 0.0–0.7)
Eosinophils Relative: 8.4 % — ABNORMAL HIGH (ref 0.0–5.0)
HCT: 45.5 % (ref 39.0–52.0)
Hemoglobin: 15.4 g/dL (ref 13.0–17.0)
Lymphocytes Relative: 22 % (ref 12.0–46.0)
Lymphs Abs: 1.8 10*3/uL (ref 0.7–4.0)
MCHC: 33.8 g/dL (ref 30.0–36.0)
MCV: 87.6 fl (ref 78.0–100.0)
Monocytes Absolute: 0.7 10*3/uL (ref 0.1–1.0)
Monocytes Relative: 8.2 % (ref 3.0–12.0)
Neutro Abs: 4.9 10*3/uL (ref 1.4–7.7)
Neutrophils Relative %: 60.8 % (ref 43.0–77.0)
Platelets: 202 10*3/uL (ref 150.0–400.0)
RBC: 5.2 Mil/uL (ref 4.22–5.81)
RDW: 15.2 % (ref 11.5–15.5)
WBC: 8.1 10*3/uL (ref 4.0–10.5)

## 2021-01-19 LAB — VITAMIN B12: Vitamin B-12: 327 pg/mL (ref 211–911)

## 2021-01-19 LAB — TSH: TSH: 2.12 u[IU]/mL (ref 0.35–4.50)

## 2021-01-19 LAB — PSA: PSA: 0.07 ng/mL — ABNORMAL LOW (ref 0.10–4.00)

## 2021-01-19 LAB — LIPID PANEL
Cholesterol: 132 mg/dL (ref 0–200)
HDL: 33.9 mg/dL — ABNORMAL LOW (ref >39.00)
NonHDL: 98.21
Total CHOL/HDL Ratio: 4
Triglycerides: 276 mg/dL — ABNORMAL HIGH (ref 0.0–149.0)
VLDL: 55.2 mg/dL — ABNORMAL HIGH (ref 0.0–40.0)

## 2021-01-19 LAB — LDL CHOLESTEROL, DIRECT: Direct LDL: 79 mg/dL

## 2021-01-19 LAB — HEMOGLOBIN A1C: Hgb A1c MFr Bld: 5.5 % (ref 4.6–6.5)

## 2021-01-19 LAB — VITAMIN D 25 HYDROXY (VIT D DEFICIENCY, FRACTURES): VITD: 51.95 ng/mL (ref 30.00–100.00)

## 2021-01-19 NOTE — Assessment & Plan Note (Signed)
Lab Results  Component Value Date   HGBA1C 5.5 01/19/2021   Stable, pt to continue current medical treatment  - diet, wt control

## 2021-01-19 NOTE — Assessment & Plan Note (Signed)
Ok to change the vit d to 10K qod,  to f/u any worsening symptoms or concerns

## 2021-01-19 NOTE — Progress Notes (Signed)
Patient ID: David Kim, male   DOB: 1946/08/05, 75 y.o.   MRN: [id number]         Chief Complaint:: yearly exam       HPI:  David Kim is a 75 y.o. male here to f/u overall doing ok, here with wife, no specific complaints, Pt denies chest pain, increased sob or doe, wheezing, orthopnea, PND, increased LE swelling, palpitations, dizziness or syncope.  Denies new worsening focal neuro s/s.   Pt denies polydipsia, polyuria,  Pt denies fever, wt loss, night sweats, loss of appetite, or other constitutional symptoms.  Has also been taking vit d 10000 units dailly, wondering if too much.     Wt Readings from Last 3 Encounters:  03/11/20 180 lb (81.6 kg)  03/10/20 180 lb (81.6 kg)  10/01/19 189 lb 9.5 oz (86 kg)   BP Readings from Last 3 Encounters:  01/19/21 110/64  03/11/20 110/64  03/10/20 120/72   Immunization History  Administered Date(s) Administered  . Fluad Quad(high Dose 65+) 07/02/2019, 07/22/2020  . Influenza Split 07/31/2011, 07/30/2012  . Influenza Whole 10/01/2007, 07/28/2008, 08/04/2009, 08/03/2010  . Influenza, High Dose Seasonal PF 09/27/2016, 10/03/2017  . Influenza,inj,Quad PF,6+ Mos 07/09/2013, 07/23/2014, 09/07/2015  . Influenza-Unspecified 07/08/2018  . PFIZER(Purple Top)SARS-COV-2 Vaccination 11/28/2019, 12/19/2019  . Pneumococcal Conjugate-13 04/15/2014  . Pneumococcal Polysaccharide-23 10/12/2008, 10/21/2014  . Td 07/28/2008  . Tdap 09/06/2018  . Zoster 10/12/2008  . Zoster Recombinat (Shingrix) 03/29/2018, 09/05/2018   There are no preventive care reminders to display for this patient.    Past Medical History:  Diagnosis Date  . Allergic rhinitis   . ALLERGIC RHINITIS 10/01/2007   Qualifier: Diagnosis of  By: Jenny Reichmann MD, Hunt Oris   . Anxiety   . Carpal tunnel syndrome    right  . Depression   . Depression   . Erectile dysfunction   . Fatigue   . GERD (gastroesophageal reflux disease)   . Gout   . Hypertension   . HYPOGONADISM 03/31/2008    Qualifier: Diagnosis of  By: Jenny Reichmann MD, Hunt Oris   . IBS (irritable bowel syndrome)   . Low back pain   . OSA (obstructive sleep apnea)   . Paraplegia ([place]) 04/23/2009   Qualifier: Diagnosis of  By: Jenny Reichmann MD, Hunt Oris   . Peptic ulcer disease   . PEPTIC ULCER DISEASE 10/01/2007   Qualifier: Diagnosis of  By: Jenny Reichmann MD, Hunt Oris   . Prostate cancer [facility])   . PROSTATE CANCER, HX OF 05/23/2007   Qualifier: Diagnosis of  By: Jenny Reichmann MD, [place] PARALYSIS 04/23/2009   Qualifier: Diagnosis of  By: Jenny Reichmann MD, Hunt Oris   . Thoracic spinal cord injury ([place]) 03/12/2012   Past Surgical History:  Procedure Laterality Date  . ARTERY AND TENDON REPAIR Left 09/06/2018   Procedure: ARTERY AND TENDON REPAIR;  Surgeon: Charlotte Crumb, MD;  Location: [place];  Service: Orthopedics;  Laterality: Left;  . CAST APPLICATION Left 31/03/4007   Procedure: CAST APPLICATION;  Surgeon: Charlotte Crumb, MD;  Location: [place];  Service: Orthopedics;  Laterality: Left;  . inguinal heniorrhaphy    . left leg surgery after fibula    . NERVE REPAIR Left 09/06/2018   Procedure: NERVE REPAIR TIMES TWO;  Surgeon: Charlotte Crumb, MD;  Location: [place];  Service: Orthopedics;  Laterality: Left;  . OPEN REDUCTION INTERNAL FIXATION (ORIF) DISTAL PHALANX Left 09/06/2018   Procedure: OPEN REDUCTION INTERNAL FIXATION (ORIF) LEFT LONG FINGER;  Surgeon:  Charlotte Crumb, MD;  Location: [place];  Service: Orthopedics;  Laterality: Left;  . PILONIDAL CYST / SINUS EXCISION    . PROSTATECTOMY    . tonsillectomy    . WOUND EXPLORATION Left 09/06/2018   Procedure: EXPLORATION OFCOMPLEX INJURY;  Surgeon: Charlotte Crumb, MD;  Location: [place];  Service: Orthopedics;  Laterality: Left;    reports that he has never smoked. He has never used smokeless tobacco. He reports that he does not drink alcohol and does not use drugs. family history includes Dementia in his mother; Diabetes in his father; High blood pressure in his mother. Allergies   Allergen Reactions  . Amoxicillin Other (See Comments)  . Endal Hd Other (See Comments)   Current Outpatient Medications on File Prior to Visit  Medication Sig Dispense Refill  . aspirin 81 MG EC tablet Take 81 mg by mouth daily.    . baclofen (LIORESAL) 20 MG tablet TAKE 1 TABLET(20 MG) BY MOUTH FOUR TIMES DAILY 360 tablet 4  . bisacodyl (DULCOLAX) 10 MG suppository Place 1 suppository (10 mg total) rectally daily after supper. 12 suppository 0  . calcium citrate-vitamin D 500-500 MG-UNIT chewable tablet Chew 1 tablet by mouth daily.    . cholecalciferol (VITAMIN D3) 25 MCG (1000 UT) tablet Take 1,000 Units by mouth daily.    . clonazePAM (KLONOPIN) 2 MG tablet TAKE 2 TABLETS(4 MG) BY MOUTH AT BEDTIME 180 tablet 1  . losartan (COZAAR) 50 MG tablet TAKE 1 TABLET(50 MG) BY MOUTH TWICE DAILY 180 tablet 1  . Multiple Vitamin (MULTIVITAMIN) capsule Take 1 capsule by mouth daily.    Marland Kitchen oxybutynin (DITROPAN) 5 MG tablet Take 5 mg by mouth 4 (four) times daily.    Marland Kitchen saccharomyces boulardii (FLORASTOR) 250 MG capsule Take 1 capsule (250 mg total) by mouth 2 (two) times daily.    Marland Kitchen tiZANidine (ZANAFLEX) 4 MG tablet TAKE 1-2 TABLETS BY MOUTH FOUR TIMES DAILY 620 tablet 3  . vitamin C (ASCORBIC ACID) 500 MG tablet Take 500 mg by mouth daily.    . vitamin E 180 MG (400 UNITS) capsule Take 800 Units by mouth daily.    . Vitamin D, Ergocalciferol, (DRISDOL) 1.25 MG (50000 UNIT) CAPS capsule Take 1 capsule (50,000 Units total) by mouth every 7 (seven) days. (Patient not taking: Reported on 01/19/2021) 12 capsule 0   No current facility-administered medications on file prior to visit.        ROS:  All others reviewed and negative.  Objective        PE:  BP 110/64   Pulse 76   Temp 98.3 F (36.8 C) (Oral)   SpO2 95%                 Constitutional: Pt appears in NAD               HENT: Head: NCAT.                Right Ear: External ear normal.                 Left Ear: External ear normal.                 Eyes: . Pupils are equal, round, and reactive to light. Conjunctivae and EOM are normal               Nose: without d/c or deformity               Neck: Neck  supple. Gross normal ROM               Cardiovascular: Normal rate and regular rhythm.                 Pulmonary/Chest: Effort normal and breath sounds without rales or wheezing.                Abd:  Soft, NT, ND, + BS, no organomegaly               Neurological: Pt is alert. At baseline orientation, motor grossly intact               Skin: Skin is warm. No rashes, no other new lesions, LE edema - trace bilat               Psychiatric: Pt behavior is normal without agitation   Micro: none  Cardiac tracings I have personally interpreted today:  none  Pertinent Radiological findings (summarize): none   Lab Results  Component Value Date   WBC 8.1 01/19/2021   HGB 15.4 01/19/2021   HCT 45.5 01/19/2021   PLT 202.0 01/19/2021   GLUCOSE 80 01/19/2021   CHOL 132 01/19/2021   TRIG 276.0 (H) 01/19/2021   HDL 33.90 (L) 01/19/2021   LDLDIRECT 79.0 01/19/2021   LDLCALC 65 01/01/2019   ALT 36 01/19/2021   AST 35 01/19/2021   NA 139 01/19/2021   K 3.6 01/19/2021   CL 103 01/19/2021   CREATININE 0.87 01/19/2021   BUN 15 01/19/2021   CO2 30 01/19/2021   TSH 2.12 01/19/2021   PSA 0.07 (L) 01/19/2021   INR 0.99 10/06/2016   HGBA1C 5.5 01/19/2021   Assessment/Plan:  David Kim is a 75 y.o. White or Caucasian [1] male with  has a past medical history of Allergic rhinitis, ALLERGIC RHINITIS (10/01/2007), Anxiety, Carpal tunnel syndrome, Depression, Depression, Erectile dysfunction, Fatigue, GERD (gastroesophageal reflux disease), Gout, Hypertension, HYPOGONADISM (03/31/2008), IBS (irritable bowel syndrome), Low back pain, OSA (obstructive sleep apnea), Paraplegia ([place]) (04/23/2009), Peptic ulcer disease, PEPTIC ULCER DISEASE (10/01/2007), Prostate cancer [facility]), PROSTATE CANCER, HX OF (05/23/2007), SPASTIC PARALYSIS (04/23/2009), and  Thoracic spinal cord injury ([place]) (03/12/2012).  Essential hypertension BP Readings from Last 3 Encounters:  01/19/21 110/64  03/11/20 110/64  03/10/20 120/72   Stable, pt to continue medical treatment losartan 50   Hyperglycemia Lab Results  Component Value Date   HGBA1C 5.5 01/19/2021   Stable, pt to continue current medical treatment  - diet, wt control   Hyperlipidemia Lab Results  Component Value Date   [place] 65 01/01/2019   Stable, pt to continue current statin  - diet   Vitamin D deficiency Ok to change the vit d to 10K qod,  to f/u any worsening symptoms or concerns  Followup: Return in about 6 months (around 07/22/2021).  Cathlean Cower, MD 01/19/2021 10:19 PM [facility]

## 2021-01-19 NOTE — Assessment & Plan Note (Signed)
Lab Results  Component Value Date   LDLCALC 65 01/01/2019   Stable, pt to continue current statin  - diet

## 2021-01-19 NOTE — Assessment & Plan Note (Signed)
BP Readings from Last 3 Encounters:  01/19/21 110/64  03/11/20 110/64  03/10/20 120/72   Stable, pt to continue medical treatment losartan 50

## 2021-01-19 NOTE — Patient Instructions (Addendum)
Ok to decrease the vit d to 10000 units every other day  Please continue all other medications as before, and refills have been done if requested.  Please have the pharmacy call with any other refills you may need.  Please continue your efforts at being more active, low cholesterol diet, and weight control.  You are otherwise up to date with prevention measures today.  Please keep your appointments with your specialists as you may have planned  Please go to the LAB at the blood drawing area for the tests to be done  You will be contacted by phone if any changes need to be made immediately.  Otherwise, you will receive a letter about your results with an explanation, but please check with MyChart first.  Please remember to sign up for MyChart if you have not done so, as this will be important to you in the future with finding out test results, communicating by private email, and scheduling acute appointments online when needed.  Please make an Appointment to return for your 1 year visit, or sooner if needed

## 2021-03-09 ENCOUNTER — Other Ambulatory Visit: Payer: Self-pay

## 2021-03-09 ENCOUNTER — Encounter: Payer: Self-pay | Admitting: Physical Medicine & Rehabilitation

## 2021-03-09 ENCOUNTER — Encounter: Payer: Medicare Other | Attending: Physical Medicine & Rehabilitation | Admitting: Physical Medicine & Rehabilitation

## 2021-03-09 VITALS — BP 104/70 | HR 88 | Temp 99.0°F

## 2021-03-09 DIAGNOSIS — S24109S Unspecified injury at unspecified level of thoracic spinal cord, sequela: Secondary | ICD-10-CM | POA: Diagnosis present

## 2021-03-09 DIAGNOSIS — G839 Paralytic syndrome, unspecified: Secondary | ICD-10-CM | POA: Insufficient documentation

## 2021-03-09 DIAGNOSIS — G8220 Paraplegia, unspecified: Secondary | ICD-10-CM | POA: Insufficient documentation

## 2021-03-09 MED ORDER — CLONAZEPAM 2 MG PO TABS
ORAL_TABLET | ORAL | 1 refills | Status: DC
Start: 2021-03-09 — End: 2021-12-06

## 2021-03-09 MED ORDER — TIZANIDINE HCL 4 MG PO TABS: ORAL_TABLET | ORAL | 3 refills | Status: DC

## 2021-03-09 NOTE — Patient Instructions (Signed)
PLEASE FEEL FREE TO CALL OUR OFFICE WITH ANY PROBLEMS OR QUESTIONS ([phone])      

## 2021-03-09 NOTE — Progress Notes (Signed)
Subjective:    Patient ID: David Kim, male    DOB: 1946-05-18, 75 y.o.   MRN: [id number]  HPI   David Kim is here in follow up of his thoracic myelopathy and associated spastic paraplegia.  He states that things have been fairly stable since we last met.  His medications are helping to control his spasms.  He notes that his right leg tends to go into extension in his left leg goes into flexion patterns most often.  He continues with his manual wheelchair but is in disrepair.  He is missing 1 armrest and the other armrest is falling apart.  He is using all sorts of modifications he is made to help control his legs in their position.  For spasticity remains on baclofen and tizanidine as well as Klonopin at nighttime.  He empties his bladder on his own although it is usually incontinent.  He is on the regular bowel program.  He is followed by urology for his bladder and has had recent renal ultrasound.    Pain Inventory Average Pain 0 Pain Right Now 0 My pain is no pain  LOCATION OF PAIN  No pain  BOWEL Number of stools per week: 7 Oral laxative use No  Type of laxative na Enema or suppository use No  History of colostomy No  Incontinent No   BLADDER Normal In and out cath, frequency na Able to self cath na Bladder incontinence No  Frequent urination No  Leakage with coughing Yes  Difficulty starting stream No  Incomplete bladder emptying No    Mobility ability to climb steps?  no do you drive?  no use a wheelchair needs help with transfers  Function disabled: date disabled . I need assistance with the following:  dressing, bathing, toileting, meal prep, household duties and shopping  Neuro/Psych spasms  Prior Studies Any changes since last visit?  no  Physicians involved in your care Primary care .   Family History  Problem Relation Age of Onset  . High blood pressure Mother   . Dementia Mother   . Diabetes Father    Social History    Socioeconomic History  . Marital status: Married    Spouse name: David Kim  . Number of children: 0  . Years of education: 45  . Highest education level: Not on file  Occupational History  . Occupation: retired    Fish farm manager: RETIRED  Tobacco Use  . Smoking status: Never Smoker  . Smokeless tobacco: Never Used  Substance and Sexual Activity  . Alcohol use: No  . Drug use: No  . Sexual activity: Not on file  Other Topics Concern  . Not on file  Social History Narrative   Patient lives at home with his wife David Kim). Patient is disabled. Patient has 12 th grade education.   Right handed.   Caffeine- None   Social Determinants of Health   Financial Resource Strain: Not on file  Food Insecurity: Not on file  Transportation Needs: Not on file  Physical Activity: Not on file  Stress: Not on file  Social Connections: Not on file   Past Surgical History:  Procedure Laterality Date  . ARTERY AND TENDON REPAIR Left 09/06/2018   Procedure: ARTERY AND TENDON REPAIR;  Surgeon: Charlotte Crumb, MD;  Location: [place];  Service: Orthopedics;  Laterality: Left;  . CAST APPLICATION Left 81/06/5630   Procedure: CAST APPLICATION;  Surgeon: Charlotte Crumb, MD;  Location: [place];  Service: Orthopedics;  Laterality: Left;  .  inguinal heniorrhaphy    . left leg surgery after fibula    . NERVE REPAIR Left 09/06/2018   Procedure: NERVE REPAIR TIMES TWO;  Surgeon: Charlotte Crumb, MD;  Location: [place];  Service: Orthopedics;  Laterality: Left;  . OPEN REDUCTION INTERNAL FIXATION (ORIF) DISTAL PHALANX Left 09/06/2018   Procedure: OPEN REDUCTION INTERNAL FIXATION (ORIF) LEFT LONG FINGER;  Surgeon: Charlotte Crumb, MD;  Location: [place];  Service: Orthopedics;  Laterality: Left;  . PILONIDAL CYST / SINUS EXCISION    . PROSTATECTOMY    . tonsillectomy    . WOUND EXPLORATION Left 09/06/2018   Procedure: EXPLORATION OFCOMPLEX INJURY;  Surgeon: Charlotte Crumb, MD;  Location: [place];  Service:  Orthopedics;  Laterality: Left;   Past Medical History:  Diagnosis Date  . Allergic rhinitis   . ALLERGIC RHINITIS 10/01/2007   Qualifier: Diagnosis of  By: Jenny Reichmann MD, Hunt Oris   . Anxiety   . Carpal tunnel syndrome    right  . Depression   . Depression   . Erectile dysfunction   . Fatigue   . GERD (gastroesophageal reflux disease)   . Gout   . Hypertension   . HYPOGONADISM 03/31/2008   Qualifier: Diagnosis of  By: Jenny Reichmann MD, Hunt Oris   . IBS (irritable bowel syndrome)   . Low back pain   . OSA (obstructive sleep apnea)   . Paraplegia ([place]) 04/23/2009   Qualifier: Diagnosis of  By: Jenny Reichmann MD, Hunt Oris   . Peptic ulcer disease   . PEPTIC ULCER DISEASE 10/01/2007   Qualifier: Diagnosis of  By: Jenny Reichmann MD, Hunt Oris   . Prostate cancer [facility])   . PROSTATE CANCER, HX OF 05/23/2007   Qualifier: Diagnosis of  By: Jenny Reichmann MD, [place] PARALYSIS 04/23/2009   Qualifier: Diagnosis of  By: Jenny Reichmann MD, Hunt Oris   . Thoracic spinal cord injury ([place]) 03/12/2012   BP 104/70   Pulse 88   Temp 99 F (37.2 C)   SpO2 94%   Opioid Risk Score:   Fall Risk Score:  `1  Depression screen PHQ 2/9  Depression screen [facility] 2/9 01/19/2021 01/19/2021 01/09/2020 07/02/2019 01/01/2019 03/29/2018 10/03/2017  Decreased Interest 0 0 0 0 0 0 0  Down, Depressed, Hopeless _0 0  PHQ - 2 Score _1 0     Review of Systems  Musculoskeletal:       Spasms  All other systems reviewed and are negative.      Objective:   Physical Exam General: No acute distress HEENT: EOMI, oral membranes moist Cards: reg rate  Chest: normal effort Abdomen: Soft, NT, ND Skin: dry, intact Extremities: no edema Psych: pleasant and appropriate Skin: He has a few scattered abrasions in the lower extremities neuro:  Tone is 2 out of 4 in both lower extremities.  He does have a 5 to 10 degree knee flexion contracture on the left.  The right knee fully extends.  There is no active movement of the lower extremities..   Musculoskeletal:  Minimal pain with range of motion           Assessment & Plan:  Medical Problem List and Plan: 1.  Spastic paraplegia d/t thoracic myelopathy             -will send to [facility] neuro rehab for wheelchair assessment. 2  spastic hemiplegia: On Tizanidine 4 mg qid, baclofen 20 mg ac/hs             -  Refilled Klonopin today             -no further botox at this time                         -monitor for now 3 Neurogenic bowel and bladder:               incontinent bladder emptying    Fifteen minutes of face to face patient care time were spent during this visit. All questions were encouraged and answered.  Follow up with me in about 2 months for follow-up after his wheelchair assessment to complete a face-to-face encounter .

## 2021-03-11 NOTE — Progress Notes (Signed)
   Subjective:    Patient ID: David Kim, male    DOB: 1946/02/19, 75 y.o.   MRN: [id number]  HPI   male followed for OSA/Insomnia, complicated by paraplegia after excision spinal cord tumor, GERD, HBP, GERD NPSG 2003, AHI 31 / hr -----------------------------------------------------------------------------------------    03/11/20-  75 year old male never smoker followed for OSA /Insomnia, complicated by Paraplegia/ Spastic L hemiparesis  after spinal tumor resected, Seizure, GERD, HBP,  CPAP  auto 5-15/ [employer] Download compliance 87%, AHI 1.3/ hr -----f/u OSA. Body weight today 180 lbs Wife here today. Wheelchair getting worn. Hosp last winter with seizure. Doing well with CPAP except hose keeps disconnecting from mask. Had 2 Phizer Covax.  03/14/21- 75 year old male never smoker followed for OSA /Insomnia, complicated by Paraplegia/ Spastic L hemiparesis  after spinal tumor resected, Seizure, GERD, HBP,  -Clonazepam 2 mg x 2(4 mg) hs,  CPAP  auto 5-15/ [employer] Download-compliance 100%, AHI 1.3/ hr Body weight today-190 lbs Covid vax-2 PhIZER ------Patient states that he is sleeping ok, machine is working good.  Clonazepam helps sleep. No concerns about CPAP. Rehab is making new chair for him.  Review of Systems   + = positive Constitutional: Negative for fever and unexpected weight change.  HENT: Negative for congestion, dental problem, ear pain, nosebleeds, postnasal drip, rhinorrhea, sinus pressure, sneezing, sore throat and trouble swallowing.   Eyes: Negative for redness and itching.  Respiratory: Negative for cough, chest tightness, shortness of breath and wheezing.   Cardiovascular: Negative for palpitations, + leg swelling.  Gastrointestinal: Negative for nausea and vomiting.  Genitourinary: Negative for dysuria.  Musculoskeletal: Negative for joint swelling.  Skin: Negative for rash.  Neurological: +per HPI Hematological: Does not bruise/bleed easily.   Psychiatric/Behavioral: Negative for dysphoric mood. The patient is not nervous/anxious.    Objective:  OBJ- Physical Exam General- Alert, Oriented, Affect-appropriate, Distress- none acute, + Wheelchair, + obese Skin- rash-none, lesions- none, excoriation- none Lymphadenopathy- none Head- atraumatic            Eyes- Gross vision intact, PERRLA, conjunctivae and secretions clear            Ears- Hearing, canals-normal            Nose- Clear, no-Septal dev, mucus, polyps, erosion, perforation             Throat- Mallampati II , mucosa clear , drainage- none, tonsils- atrophic, + missing teeth                   neck- flexible , trachea midline, no stridor , thyroid nl, carotid no bruit Chest - symmetrical excursion , unlabored           Heart/CV- RRR , no murmur , no gallop  , no rub, nl s1 s2                           - JVD- none , edema-+1, R>L, stasis changes- none, varices- none           Lung- clear to P&A, wheeze- none, cough- none , dullness-none, rub- none           Chest wall-  Abd-  Br/ Gen/ Rectal- Not done, not indicated Extrem- cyanosis- none, clubbing, none, atrophy- none, strength- nl Neuro- +paraplegic L   Baird Lyons, MD

## 2021-03-14 ENCOUNTER — Encounter: Payer: Self-pay | Admitting: Internal Medicine

## 2021-03-14 ENCOUNTER — Other Ambulatory Visit: Payer: Self-pay

## 2021-03-14 ENCOUNTER — Ambulatory Visit (INDEPENDENT_AMBULATORY_CARE_PROVIDER_SITE_OTHER): Payer: Medicare Other | Admitting: Internal Medicine

## 2021-03-14 DIAGNOSIS — G4733 Obstructive sleep apnea (adult) (pediatric): Secondary | ICD-10-CM | POA: Diagnosis not present

## 2021-03-14 DIAGNOSIS — S24109S Unspecified injury at unspecified level of thoracic spinal cord, sequela: Secondary | ICD-10-CM

## 2021-03-14 NOTE — Patient Instructions (Signed)
We can continue CPAP auto 5-15  Please call if we can help   Good luck finding the wheelchair place!  Please call if we can help

## 2021-03-17 ENCOUNTER — Telehealth: Payer: Self-pay

## 2021-03-28 ENCOUNTER — Ambulatory Visit: Payer: Medicare Other | Admitting: Physical Therapy

## 2021-05-04 NOTE — Telephone Encounter (Signed)
Error

## 2021-05-10 ENCOUNTER — Other Ambulatory Visit: Payer: Self-pay | Admitting: Internal Medicine

## 2021-05-10 NOTE — Telephone Encounter (Signed)
Please refill as per office routine med refill policy (all routine meds refilled for 3 mo or monthly per pt preference up to one year from last visit, then month to month grace period for 3 mo, then further med refills will have to be denied)  

## 2021-05-11 ENCOUNTER — Other Ambulatory Visit: Payer: Self-pay

## 2021-05-11 ENCOUNTER — Encounter: Payer: Medicare Other | Attending: Physical Medicine & Rehabilitation | Admitting: Physical Medicine & Rehabilitation

## 2021-05-11 ENCOUNTER — Encounter: Payer: Self-pay | Admitting: Physical Medicine & Rehabilitation

## 2021-05-11 VITALS — BP 105/69 | HR 79 | Temp 98.7°F

## 2021-05-11 DIAGNOSIS — S24109S Unspecified injury at unspecified level of thoracic spinal cord, sequela: Secondary | ICD-10-CM | POA: Insufficient documentation

## 2021-05-11 DIAGNOSIS — G8220 Paraplegia, unspecified: Secondary | ICD-10-CM | POA: Insufficient documentation

## 2021-05-11 NOTE — Progress Notes (Signed)
Subjective:    Patient ID: David Kim, male    DOB: May 25, 1946, 75 y.o.   MRN: [id number]  HPI   Pain Inventory Average Pain 0 Pain Right Now 0 My pain is  no pain  In the last 24 hours, has pain interfered with the following? General activity 0 Relation with others 0 Enjoyment of life 0 What TIME of day is your pain at its worst? No pain Sleep (in general) Fair  Pain is worse with:  no pain Pain improves with:  no pain Relief from Meds: no pain  ability to climb steps?  no do you drive?  no use a wheelchair transfers alone Do you have any goals in this area?  yes  retired I need assistance with the following:  bathing, toileting, meal prep, household duties, and shopping Do you have any goals in this area?  yes  bladder control problems weakness numbness trouble walking spasms  Any changes since last visit?  no  Any changes since last visit?  no    Family History  Problem Relation Age of Onset   High blood pressure Mother    Dementia Mother    Diabetes Father    Social History   Socioeconomic History   Marital status: Married    Spouse name: [name]   Number of children: 0   Years of education: 12   Highest education level: Not on file  Occupational History   Occupation: retired    Fish farm manager: RETIRED  Tobacco Use   Smoking status: Never   Smokeless tobacco: Never  Vaping Use   Vaping Use: Never used  Substance and Sexual Activity   Alcohol use: No   Drug use: No   Sexual activity: Not on file  Other Topics Concern   Not on file  Social History Narrative   Patient lives at home with [name]). Patient is disabled. Patient has 12 th grade education.   Right handed.   Caffeine- None   Social Determinants of Health   Financial Resource Strain: Not on file  Food Insecurity: Not on file  Transportation Needs: Not on file  Physical Activity: Not on file  Stress: Not on file  Social Connections: Not on file   Past Surgical  History:  Procedure Laterality Date   ARTERY AND TENDON REPAIR Left 09/06/2018   Procedure: ARTERY AND TENDON REPAIR;  Surgeon: Charlotte Crumb, MD;  Location: [place];  Service: Orthopedics;  Laterality: Left;   CAST APPLICATION Left 91/04/3845   Procedure: CAST APPLICATION;  Surgeon: Charlotte Crumb, MD;  Location: [place];  Service: Orthopedics;  Laterality: Left;   inguinal heniorrhaphy     left leg surgery after fibula     NERVE REPAIR Left 09/06/2018   Procedure: NERVE REPAIR TIMES TWO;  Surgeon: Charlotte Crumb, MD;  Location: [facility];  Service: Orthopedics;  Laterality: Left;   OPEN REDUCTION INTERNAL FIXATION (ORIF) DISTAL PHALANX Left 09/06/2018   Procedure: OPEN REDUCTION INTERNAL FIXATION (ORIF) LEFT LONG FINGER;  Surgeon: Charlotte Crumb, MD;  Location: [place];  Service: Orthopedics;  Laterality: Left;   PILONIDAL CYST / SINUS EXCISION     PROSTATECTOMY     tonsillectomy     WOUND EXPLORATION Left 09/06/2018   Procedure: EXPLORATION OFCOMPLEX INJURY;  Surgeon: Charlotte Crumb, MD;  Location: [facility];  Service: Orthopedics;  Laterality: Left;   Past Medical History:  Diagnosis Date   Allergic rhinitis    ALLERGIC RHINITIS 10/01/2007   Qualifier: Diagnosis of  By: Jenny Reichmann MD, [place]    Carpal tunnel syndrome    right   Depression    Depression    Erectile dysfunction    Fatigue    GERD (gastroesophageal reflux disease)    Gout    Hypertension    HYPOGONADISM 03/31/2008   Qualifier: Diagnosis of  By: Jenny Reichmann MD, [place] [facility]    IBS (irritable bowel syndrome)    Low back pain    OSA (obstructive sleep apnea)    Paraplegia ([place]) 04/23/2009   Qualifier: Diagnosis of  By: Jenny Reichmann MD, [place] [facility]    Peptic ulcer disease    PEPTIC ULCER DISEASE 10/01/2007   Qualifier: Diagnosis of  By: Jenny Reichmann MD, [place] [facility]    Prostate cancer [facility])    PROSTATE CANCER, HX OF 05/23/2007   Qualifier: Diagnosis of  By: Jenny Reichmann MD, [place] PARALYSIS 04/23/2009   Qualifier: Diagnosis of  By: Jenny Reichmann  MD, [place] [facility]    Thoracic spinal cord injury ([place]) 03/12/2012   BP 105/69   Pulse 79   Temp 98.7 F (37.1 C)   SpO2 94%   Opioid Risk Score:   Fall Risk Score:  `1  Depression screen PHQ 2/9  Depression screen [facility] 2/9 01/19/2021 01/19/2021 01/09/2020 07/02/2019 01/01/2019 03/29/2018 10/03/2017  Decreased Interest 0 0 0 0 0 0 0  Down, Depressed, Hopeless 1 1 1 1 1 1  0  PHQ - 2 Score 1 1 1 1 1 1  0    Review of Systems  Constitutional: Negative.   HENT: Negative.    Eyes: Negative.   Respiratory:  Positive for apnea.   Cardiovascular:  Positive for leg swelling.  Gastrointestinal:  Positive for constipation and diarrhea.  Endocrine: Negative.   Genitourinary:  Positive for difficulty urinating.  Musculoskeletal:  Positive for gait problem.       Spasms  Skin: Negative.   Allergic/Immunologic: Negative.   Neurological:  Positive for weakness and numbness.  Hematological: Negative.   Psychiatric/Behavioral: Negative.    All other systems reviewed and are negative.     Objective:   Physical Exam  General: No acute distress HEENT: EOMI, oral membranes moist Cards: reg rate  Chest: normal effort Abdomen: Soft, NT, ND Skin: dry, intact Extremities: no edema Psych: pleasant and appropriate  Skin: He has a few scattered abrasions in the lower extremities neuro:  Tone is 2 out of 4 in both lower extremities.  He does have a 5 to 10 degree knee flexion contracture on the left.  The right knee fully extends.  There is no active movement of the lower extremities..  Musculoskeletal:  Minimal pain with range of motion           Assessment & Plan:  Medical Problem List and Plan: 1.  Spastic paraplegia d/t thoracic myelopathy             -HASN'T gone yet to neuro rehab d/t unavailability of provider to do w/c eval.  2  spastic hemiplegia: On Tizanidine 4 mg qid, baclofen 20 mg ac/hs             -klonopin recently refilled.             -no further botox at this time                          -monitor for now 3 Neurogenic bowel and bladder:  incontinent bladder emptying   F/u with me  2 mos tentatively after w/c assessment.     No charge for today's visit.

## 2021-05-11 NOTE — Patient Instructions (Addendum)
PLEASE FEEL FREE TO CALL OUR OFFICE WITH ANY PROBLEMS OR QUESTIONS ([phone])   IF YOUR WHEELCHAIR EVAL IS DELAYED, PLEASE CALL AND CHANGE YOUR APPOINTMENT.

## 2021-06-21 ENCOUNTER — Ambulatory Visit: Payer: Medicare Other | Attending: Internal Medicine | Admitting: Physical Therapy

## 2021-06-21 ENCOUNTER — Other Ambulatory Visit: Payer: Self-pay

## 2021-06-21 DIAGNOSIS — G8221 Paraplegia, complete: Secondary | ICD-10-CM | POA: Insufficient documentation

## 2021-06-21 DIAGNOSIS — R208 Other disturbances of skin sensation: Secondary | ICD-10-CM | POA: Diagnosis not present

## 2021-06-21 DIAGNOSIS — R2681 Unsteadiness on feet: Secondary | ICD-10-CM | POA: Insufficient documentation

## 2021-06-21 DIAGNOSIS — R293 Abnormal posture: Secondary | ICD-10-CM | POA: Insufficient documentation

## 2021-06-21 NOTE — Therapy (Addendum)
[facility] [address] [place], [place], [address] Phone: [phone]   Fax:  [phone]  Physical Therapy Wheelchair Evaluation  Patient Details  Name: David Kim MRN: [id number] Date of Birth: 1946-06-14 Referring Provider (PT): Meredith Staggers, MD   Encounter Date: 06/21/2021   PT End of Session - 06/21/21 1510     Visit Number 1    Number of Visits 1    Date for PT Re-Evaluation 06/21/21    Authorization Type Medicare A&B, Federal BCBS    PT Start Time 1020    PT Stop Time 1130    PT Time Calculation (min) 70 min    Activity Tolerance Patient tolerated treatment well    Behavior During Therapy [facility] for tasks assessed/performed             Past Medical History:  Diagnosis Date   Allergic rhinitis    ALLERGIC RHINITIS 10/01/2007   Qualifier: Diagnosis of  By: Jenny Reichmann MD, Hunt Oris    Anxiety    Carpal tunnel syndrome    right   Depression    Depression    Erectile dysfunction    Fatigue    GERD (gastroesophageal reflux disease)    Gout    Hypertension    HYPOGONADISM 03/31/2008   Qualifier: Diagnosis of  By: Jenny Reichmann MD, Hunt Oris    IBS (irritable bowel syndrome)    Low back pain    OSA (obstructive sleep apnea)    Paraplegia ([place]) 04/23/2009   Qualifier: Diagnosis of  By: Jenny Reichmann MD, Hunt Oris    Peptic ulcer disease    PEPTIC ULCER DISEASE 10/01/2007   Qualifier: Diagnosis of  By: Jenny Reichmann MD, Hunt Oris    Prostate cancer [facility])    PROSTATE CANCER, HX OF 05/23/2007   Qualifier: Diagnosis of  By: Jenny Reichmann MD, [place] PARALYSIS 04/23/2009   Qualifier: Diagnosis of  By: Jenny Reichmann MD, Hunt Oris    Thoracic spinal cord injury ([place]) 03/12/2012    Past Surgical History:  Procedure Laterality Date   ARTERY AND TENDON REPAIR Left 09/06/2018   Procedure: ARTERY AND TENDON REPAIR;  Surgeon: Charlotte Crumb, MD;  Location: [place];  Service: Orthopedics;  Laterality: Left;   CAST APPLICATION Left 69/04/7892    Procedure: CAST APPLICATION;  Surgeon: Charlotte Crumb, MD;  Location: [place];  Service: Orthopedics;  Laterality: Left;   inguinal heniorrhaphy     left leg surgery after fibula     NERVE REPAIR Left 09/06/2018   Procedure: NERVE REPAIR TIMES TWO;  Surgeon: Charlotte Crumb, MD;  Location: [place];  Service: Orthopedics;  Laterality: Left;   OPEN REDUCTION INTERNAL FIXATION (ORIF) DISTAL PHALANX Left 09/06/2018   Procedure: OPEN REDUCTION INTERNAL FIXATION (ORIF) LEFT LONG FINGER;  Surgeon: Charlotte Crumb, MD;  Location: [place];  Service: Orthopedics;  Laterality: Left;   PILONIDAL CYST / SINUS EXCISION     PROSTATECTOMY     tonsillectomy     WOUND EXPLORATION Left 09/06/2018   Procedure: EXPLORATION OFCOMPLEX INJURY;  Surgeon: Charlotte Crumb, MD;  Location: [place];  Service: Orthopedics;  Laterality: Left;    There were no vitals filed for this visit.    Subjective Assessment - 06/21/21 1241     Subjective Pt referred to [facility] for evaluation for new manual wheelchair.  He is a lifetime wheelchair user due to thoracic myelopathy and spastic paraplegia.  His current manual wheelchair is >43 years old and is in disrepair  Patient is accompained by: Family member    Pertinent History anxiety, carpal tunnel syndrome, depression, fatigue, gout, HTN, IBS, LBP, OSA, prostate CA with prostatectomy, spastic paralysis, thoracic spinal cord injury, L middle finger atery and tendon repair, ORIF L distal phalanx of middle finger, pt reports seizure    Patient Stated Goals to obtain a new manual wheelchair exactly like his current one    Currently in Pain? No/denies                [facility] PT Assessment - 06/21/21 1508       Assessment   Medical Diagnosis Thoracic myelopathy and spastic paraplegia    Referring Provider (PT) Meredith Staggers, MD    Onset Date/Surgical Date 03/09/21    Prior Therapy CIR      Precautions   Precautions Fall      Prior Function   Level of Independence  Independent with basic ADLs;Independent with household mobility with device;Requires assistive device for independence;Other (comment)   independent with wheelchair              Mobility/Seating Evaluation    PATIENT INFORMATION: Name: David Kim, David Kim DOB: 12/31/1945  Sex: Male Date seen: 06/21/2021 Time: 10:20  Address:  [place] [address] Physician: Meredith Staggers, MD  This evaluation/justification form will serve as the LMN for the following suppliers: __________________________ Supplier: [employer] Contact Person: Luz Brazen, ATP Phone:  [phone]   Seating Therapist: Misty Stanley, PT Phone:   [phone]   Phone: [phone]     Spouse/Parent/Caregiver name: Wife-David Kim  Phone number: [phone]  Insurance/Payer: Medicare A&B and [employer] PPO     Reason for Referral: New Manual wheelchair  Patient/Caregiver Goals: To obtain a new manual wheelchair with exactly the same components as his current wheelchair.    Patient was seen for face-to-face evaluation for new manual wheelchair.  Also present was patient's wife and ATP to discuss recommendations and wheelchair options.  Further paperwork was completed and sent to vendor.  Patient appears to qualify for power mobility device at this time per objective findings.   MEDICAL HISTORY: Diagnosis: Primary Diagnosis: Thoracic myelopathy and spastic paraplegia Onset: 2010 Diagnosis: Seizure   '[]' Progressive Disease Relevant past and future surgeries: Past surgical history: L finger tendon repair; Future Surgery: has cataract surgery scheduled for bilat eyes     Height: 5'8" Weight: 190 lb Explain recent changes or trends in weight:    History including Falls: 1 fall out of the chair, leaning too far forwards.  PMH: anxiety, R carpal tunnel syndrome, depression, fatigue, gout, HTN, IBS, LBP, OSA, prostate CA with prostatectomy, spastic paralysis, thoracic spinal cord injury, L middle  finger atery and tendon repair, ORIF L distal phalanx of middle finger, pt reports seizure      HOME ENVIRONMENT: '[x]' House  '[]' Condo/town home  '[]' Apartment  '[]' Assisted Living    '[]' Lives Alone '[x]'  Lives with Others                                                                                          Hours with caregiver:   '[x]' Home is accessible  to patient           Stairs      '[]' Yes '[x]'  No     Ramp '[x]' Yes '[]' No Comments:  Current manual wheelchair fits through all doorways, hardwood floors, carpet in living room but low pile   COMMUNITY ADL: TRANSPORTATION: '[x]' Car    '[]' Van    '[]' Public Transportation    '[]' Adapted w/c Lift    '[]' Ambulance    '[]' Other:       '[]' Sits in wheelchair during transport  Employment/School:  Specific requirements pertaining to mobility   Other: Pt transfers into car and wife loads/stores wheelchair in trunk    FUNCTIONAL/SENSORY PROCESSING SKILLS:  Handedness:   '[]' Right     '[]' Left    '[x]' NA  Comments:    Functional Processing Skills for Wheeled Mobility '[x]' Processing Skills are adequate for safe wheelchair operation  Areas of concern than may interfere with safe operation of wheelchair Description of problem   '[]'  Attention to environment      '[]' Judgment      '[]'  Hearing  '[]'  Vision or visual processing      '[]' Motor Planning  '[]'  Fluctuations in Behavior      VERBAL COMMUNICATION: '[x]' WFL receptive '[x]'  WFL expressive '[]' Understandable  '[]' Difficult to understand  '[]' non-communicative '[]'  Uses an augmented communication device  CURRENT SEATING / MOBILITY: Current Mobility Base:  '[]' None '[]' Dependent '[x]' Manual '[]' Scooter '[]' Power  Type of Control:   Manufacturer:  [employer] Statistician:  18 x 18 Age: >5 years  Current Condition of Mobility Base:  Poor-no left arm rest, R arm rest tape over padding, both brakes loose, casters worn down, using ace wrap to prevent excessive L knee flexion, strap around bilat ankles to prevent R knee extension spasticity, belt around bilat knees to  prevent hip ER/ABD   Current Wheelchair components:  Rigid folding frame, adjustable height/removable arm rest, swing away/lift off leg rests, push to lock brakes, [employer], [place] 3 low contour back removable, 5" front casters, 24" rear wheels, anti-tippers, flat free inserts, metals spokes, quick release wheels, fully adjustable axle  Describe posture in present seating system:  Posterior tilt, trunk shifted to R, RLE externally rotated, multiple straps used for LE positioning      SENSATION and SKIN ISSUES: Sensation '[]' Intact  '[]' Impaired '[x]' Absent  Level of sensation: T11-T12 Pressure Relief: Able to perform effective pressure relief :    '[]' Yes  '[x]'  No Method:  If not, Why?: Not able to boost, or perform lateral leans - missing arm rest.  Wife lacks strength to tip wheelchair for pressure relief  Skin Issues/Skin Integrity Current Skin Issues  '[]' Yes '[x]' No '[x]' Intact '[]'  Red area'[]'  Open Area  '[]' Scar Tissue '[x]' At risk from prolonged sitting Where    History of Skin Issues  '[]' Yes '[x]' No Where   When    Hx of skin flap surgeries  '[]' Yes '[x]' No Where   When    Limited sitting tolerance '[]' Yes '[x]' No Hours spent sitting in wheelchair daily: 12+  Complaint of Pain:  Please describe: None   Swelling/Edema: bilat LE dependent edema    ADL STATUS (in reference to wheelchair use):  Indep Assist Unable Indep with Equip Not assessed Comments  Dressing '[]'  '[]'  '[]'  '[x]'  '[]'  set up assistance, performs bed level  Eating '[]'  '[]'  '[]'  '[x]'  '[]'  wheelchair level   Toileting '[]'  '[x]'  '[]'  '[x]'  '[]'  urinal for urination seated in wheelchair; bowel program in bed  Bathing '[]'  '[]'  '[]'  '[x]'  '[]'  sponge bathing in the bed  Grooming/Hygiene '[]'  '[]'  '[]'  [  x] '[]'  wheelchair level at sink  Meal Prep '[]'  '[]'  '[]'  '[x]'  '[]'  light meal prep wheelchair level   IADLS '[]'  '[x]'  '[]'  '[]'  '[]'  wife's assistance  Bowel Management: '[]' Continent  '[]' Incontinent  '[x]' Accidents Comments:  bowel program in bed, accidents if stools loose  Bladder Management:  '[]' Continent  '[]' Incontinent  '[x]' Accidents Comments:       WHEELCHAIR SKILLS: Manual w/c Propulsion: '[x]' UE or LE strength and endurance sufficient to participate in ADLs using manual wheelchair Arm : '[x]' left '[x]' right   '[]' Both      Distance: >500 ft Foot:  '[]' left '[]' right   '[]' Both  Operate Scooter: '[]'  Strength, hand grip, balance and transfer appropriate for use '[]' Living environment is accessible for use of scooter  Operate Power w/c:  '[]'  Std. Joystick   '[]'  Alternative Controls Indep '[]'  Assist '[]'  Dependent/unable '[]'  N/A '[]'   '[]' Safe          '[]'  Functional      Distance:   Bed confined without wheelchair '[x]'  Yes '[]'  No   STRENGTH/RANGE OF MOTION:  AROM Range of Motion Strength  Shoulder >100 if back supported 4/5 if back supported  Elbow WFL 5/5 if back supported  Wrist/Hand wrist WFL; R hand WFL, L middle finger distal phalanx in flexion contracture due to surgery 5/5  Hip R hip fractured; able to extend fully in supine L hip stays in flexion due to spasticity  0/5  Knee L knee 90 > lacking 40 deg to full extension R knee WFL 0/5  Ankle WFL 0/5     MOBILITY/BALANCE:  '[]'  Patient is totally dependent for mobility      Balance Transfers Ambulation  Sitting Balance: Standing Balance: '[]'  Independent '[]'  Independent/Modified Independent  '[]'  WFL     '[]'  WFL '[]'  Supervision '[]'  Supervision  '[x]'  Uses UE for balance  '[]'  Supervision '[]'  Min Assist '[]'  Ambulates with Assist      '[x]'  Min Assist '[]'  Min assist '[x]'  Mod Assist '[]'  Ambulates with Device:      '[]'  RW  '[]'  StW  '[]'  Cane  '[]'    '[]'  Mod Assist '[]'  Mod assist '[]'  Max assist   '[]'  Max Assist '[]'  Max assist '[]'  Dependent '[]'  Indep. Short Distance Only  '[]'  Unable '[x]'  Unable '[]'  Lift / Sling Required Distance (in feet)     '[x]'  Sliding board '[x]'  Unable to Ambulate (see explanation below)  Cardio Status:  '[x]' Intact  '[]'  Impaired   '[]'  NA       Respiratory Status:  '[x]' Intact   '[]' Impaired   '[]' NA       Orthotics/Prosthetics: None  Comments (Address manual vs  power w/c vs scooter): David Kim has mobility limitation that significantly impairs safe, timely participation in one or more mobility related ADL's.  David Kim presents with trunk and bilateral lower extremity absent sensation and muscle paralysis due to thoracic myelopathy and spastic paraplegia since 2010.  David Kim's mobility deficit cannot be remediated with a cane or walker due to lower extremity muscle paralysis and absent sensation; [place] has been unable to stand or ambulate since 2017.  David Kim is unable to utilize a regular or lightweight manual wheelchair due to the weight and lack of adjustability of a lower coded manual wheelchair.  David Kim has right carpal tunnel syndrome and a flexion contracture of his L middle phalanx and requires an ultra-lightweight frame and fully adjustable axle to maximize joint position and propulsion efficiency.  Day[place] also presents with multiple pelvic, trunk and lower extremity abnormalities and impaired postural  control and requires the use of a positioning cushion and backrest to maintain sitting balance during dynamic upper extremity use.  An ultra-lightweight frame and the ability to break down the chair for storage is required because David Kim's elderly wife is his primary caregiver and must lift the wheelchair into and out of their car.  She is unable to lift or store a regular or lightweight manual wheelchair when transporting David Kim in the community.  David Kim is a life-time wheelchair user and has been using an ultra-lightweight manual wheelchair independently and safely since 2010.  He requires continued use of an ultra-lightweight manual wheelchair for functional mobility independence in his home environment and to maintain independence with MRADLs.           Anterior / Posterior Obliquity Rotation-Pelvis   PELVIS    '[]'  '[x]'  '[]'   Neutral Posterior Anterior  '[]'  '[x]'  '[]'   WFL Rt elev Lt elev  '[]'  '[]'  '[x]'   WFL Right Left                      Anterior    Anterior      '[]'  Fixed '[]'  Other '[x]'  Partly Flexible '[]'  Flexible   '[]'  Fixed '[]'  Other '[x]'  Partly Flexible  '[]'  Flexible  '[]'  Fixed '[]'  Other '[]'  Partly Flexible  '[]'  Flexible   TRUNK  '[x]'  '[]'  '[]'   WFL  Thoracic  Lumbar  Kyphosis Lordosis  '[]'  '[]'  '[x]'   WFL Convex Convex  Right Left '[x]' c-curve '[]' s-curve '[]' multiple  '[x]'  Neutral '[]'  Left-anterior '[]'  Right-anterior     '[]'  Fixed '[]'  Flexible '[]'  Partly Flexible '[]'  Other  '[]'  Fixed '[]'  Flexible '[x]'  Partly Flexible '[]'  Other  '[]'  Fixed             '[]'  Flexible '[]'  Partly Flexible '[]'  Other    Position Windswept    HIPS          '[]'            '[x]'               '[]'    Neutral       Abduct        ADduct         '[x]'           '[]'            '[]'   Neutral Right           Left      '[]'  Fixed '[]'  Subluxed '[x]'  Partly Flexible '[]'  Dislocated '[]'  Flexible  '[]'  Fixed '[]'  Other '[]'  Partly Flexible  '[]'  Flexible                 Foot Positioning Knee Positioning      '[x]'  WFL  '[x]' Lt '[x]' Rt '[x]'  WFL  '[x]' Lt '[]' Rt    KNEES ROM concerns: ROM concerns:    & Dorsi-Flexed '[]' Lt '[]' Rt     FEET Plantar Flexed '[]' Lt '[]' Rt      Inversion                 '[]' Lt '[]' Rt      Eversion                 '[]' Lt '[]' Rt     HEAD '[x]'  Functional '[x]'  Good Head Control    & '[]'  Flexed         '[]'  Extended '[]'  Adequate Head Control    NECK '[]'  Rotated  Lt  '[]'  Lat Flexed Lt '[]'  Rotated  Rt '[]'  Lat  Flexed Rt '[]'  Limited Head Control     '[]'  Cervical Hyperextension '[]'  Absent  Head Control     SHOULDERS ELBOWS WRIST& HAND       Left     Right    Left     Right    Left     Right   U/E '[x]' Functional           '[x]' Functional   '[]' Fisting             '[]' Fisting      '[x]' elev   '[]' dep      '[]' elev   '[]' dep       '[]' pro -'[]' retract     '[]' pro  '[]' retract '[]' subluxed             '[]' subluxed           Goals for Wheelchair Mobility  '[x]'  Independence with mobility in the home with motor related ADLs (MRADLs)  '[x]'  Independence with MRADLs in the community '[]'  Provide dependent mobility  '[]'  Provide recline     '[]' Provide tilt   Goals for Seating  system '[x]'  Optimize pressure distribution '[x]'  Provide support needed to facilitate function or safety '[x]'  Provide corrective forces to assist with maintaining or improving posture '[]'  Accommodate client's posture:   current seated postures and positions are not flexible or will not tolerate corrective forces '[x]'  Client to be independent with relieving pressure in the wheelchair '[]' Enhance physiological function such as breathing, swallowing, digestion  Simulation ideas/Equipment trials: State why other equipment was unsuccessful:   MOBILITY BASE RECOMMENDATIONS and JUSTIFICATION: MOBILITY COMPONENT JUSTIFICATION  Manufacturer: Model:    Size: Width Seat Depth  '[x]' provide transport from point A to B      '[x]' promote Indep mobility  '[x]' is not a safe, functional ambulator '[x]' walker or cane inadequate '[]' non-standard width/depth necessary to accommodate anatomical measurement '[]'    '[x]' Manual Mobility Base '[x]' non-functional ambulator    '[]' Scooter/POV  '[]' can safely operate  '[]' can safely transfer   '[]' has adequate trunk stability  '[]' cannot functionally propel manual w/c  '[]' Power Mobility Base  '[]' non-ambulatory  '[]' cannot functionally propel manual wheelchair  '[]'  cannot functionally and safely operate scooter/POV '[]' can safely operate and willing to  '[]' Stroller Base '[]' infant/child  '[]' unable to propel manual wheelchair '[]' allows for growth '[]' non-functional ambulator '[]' non-functional UE '[]' Indep mobility is not a goal at this time  '[]' Tilt  '[]' Forward '[]' Backward '[]' Powered tilt  '[]' Manual tilt  '[]' change position against gravitational force on head and shoulders  '[]' change position for pressure relief/cannot weight shift '[]' transfers  '[]' management of tone '[]' rest periods '[]' control edema '[]' facilitate postural control  '[]'    '[]' Recline  '[]' Power recline on power base '[]' Manual recline on manual base  '[]' accommodate femur to back angle  '[]' bring to full recline for ADL care  '[]' change position for pressure  relief/cannot weight shift '[]' rest periods '[]' repositioning for transfers or clothing/diaper /catheter changes '[]' head positioning  '[x]' Lighter weight required '[x]' self- propulsion  '[x]' lifting '[]'    '[]' Heavy Duty required '[]' user weight greater than 250# '[]' extreme tone/ over active movement '[]' broken frame on previous chair '[]'    '[x]'  Back  '[x]'  Angle Adjustable '[]'  Custom molded  '[x]' postural control '[x]' control of tone/spasticity '[]' accommodation of range of motion '[x]' UE functional control '[]' accommodation for seating system '[]'   '[]' provide lateral trunk support '[]' accommodate deformity '[x]' provide posterior trunk support '[x]' provide lumbar/sacral support '[x]' support trunk in midline '[x]' Pressure relief over spinal processes  '[x]'  Seat Cushion  '[x]' impaired sensation  '[]' decubitus ulcers present '[]' history of pressure ulceration '[x]' prevent pelvic extension '[x]' low maintenance  '[x]' stabilize pelvis  '[x]' accommodate obliquity [  x]accommodate multiple deformity '[x]' neutralize lower extremity position '[x]' increase pressure distribution '[]'    '[]'  Pelvic/thigh support  '[]'  Lateral thigh guide '[]'  Distal medial pad  '[]'  Distal lateral pad '[]'  pelvis in neutral '[]' accommodate pelvis '[]'  position upper legs '[]'  alignment '[]'  accommodate ROM '[]'  decr adduction '[]' accommodate tone '[]' removable for transfers '[]' decr abduction  '[]'  Lateral trunk Supports '[]'  Lt     '[]'  Rt '[]' decrease lateral trunk leaning '[]' control tone '[]' contour for increased contact '[]' safety  '[]' accommodate asymmetry '[]'    '[]'  Mounting hardware  '[]' lateral trunk supports  '[]' back   '[]' seat '[]' headrest      '[]'  thigh support '[]' fixed   '[]' swing away '[]' attach seat platform/cushion to w/c frame '[]' attach back cushion to w/c frame '[]' mount postural supports '[]' mount headrest  '[]' swing medial thigh support away '[]' swing lateral supports away for transfers  '[]'      Armrests  '[]' fixed '[x]' adjustable height '[x]' removable   '[]' swing away  '[]' flip back   '[]' reclining '[x]' full length  pads '[]' desk    '[]' pads tubular  '[x]' provide support with elbow at 90   '[]' provide support for w/c tray '[x]' change of height/angles for variable activities '[x]' remove for transfers '[]' allow to come closer to table top '[x]' remove for access to tables '[]'    Hangers/ Leg rests  '[]' 60 '[]' 70 '[]' 90 '[]' elevating '[]' heavy duty  '[]' articulating '[]' fixed '[]' lift off '[x]' swing away     '[]' power '[x]' provide LE support  '[]' accommodate to hamstring tightness '[]' elevate legs during recline   '[]' provide change in position for Legs '[]' Maintain placement of feet on footplate '[]' durability '[x]' enable transfers '[]' decrease edema '[]' Accommodate lower leg length '[]'    Foot support Footplate    '[x]' Lt  '[x]'  Rt  '[]'  Center mount '[x]' flip up     '[]' depth/angle adjustable '[]' Amputee adapter    '[]'  Lt     '[]'  Rt '[x]' provide foot support '[]' accommodate to ankle ROM '[x]' transfers '[]' Provide support for residual extremity '[]'  allow foot to go under wheelchair base '[]'  decrease tone  '[]'    '[x]'  Ankle strap/heel loops '[x]' support foot on foot support '[x]' decrease extraneous movement '[]' provide input to heel  '[x]' protect foot  Tires: '[]' pneumatic  '[x]' flat free inserts  '[]' solid  '[x]' decrease maintenance  '[x]' prevent frequent flats '[]' increase shock absorbency '[]' decrease pain from road shock '[]' decrease spasms from road shock '[]'    '[]'  Headrest  '[]' provide posterior head support '[]' provide posterior neck support '[]' provide lateral head support '[]' provide anterior head support '[]' support during tilt and recline '[]' improve feeding   '[]' improve respiration '[]' placement of switches '[]' safety  '[]' accommodate ROM  '[]' accommodate tone '[]' improve visual orientation  '[]'  Anterior chest strap '[]'  Vest '[]'  Shoulder retractors  '[]' decrease forward movement of shoulder '[]' accommodation of TLSO '[]' decrease forward movement of trunk '[]' decrease shoulder elevation '[]' added abdominal support '[]' alignment '[]' assistance with shoulder control  '[]'    Pelvic Positioner '[]' Belt '[]' SubASIS  bar '[]' Dual Pull '[]' stabilize tone '[]' decrease falling out of chair/ **will not Decr potential for sliding due to pelvic tilting '[]' prevent excessive rotation '[]' pad for protection over boney prominence '[]' prominence comfort '[]' special pull angle to control rotation '[]'    Upper Extremity Support '[]' L   '[]'  R '[]' Arm trough    '[]' hand support '[]'  tray       '[]' full tray '[]' swivel mount '[]' decrease edema      '[]' decrease subluxation   '[]' control tone   '[]' placement for AAC/Computer/EADL '[]' decrease gravitational pull on shoulders '[]' provide midline positioning '[]' provide support to increase UE function '[]' provide hand support in natural position '[]' provide work surface   POWER WHEELCHAIR CONTROLS  '[]' Proportional  '[]' Non-Proportional Type  '[]' Left  '[]' Right '[]' provides access for controlling wheelchair   '[]'   lacks motor control to operate proportional drive control '[]' unable to understand proportional controls  Actuator Control Module  '[]' Single  '[]' Multiple   '[]' Allow the client to operate the power seat function(s) through the joystick control   '[]' Safety Reset Switches '[]' Used to change modes and stop the wheelchair when driving in latch mode    '[]' Upgraded Electronics   '[]' programming for accurate control '[]' progressive Disease/changing condition '[]' non-proportional drive control needed '[]' Needed in order to operate power seat functions through joystick control   '[]' Display box '[]' Allows user to see in which mode and drive the wheelchair is set  '[]' necessary for alternate controls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

## 2021-07-08 ENCOUNTER — Other Ambulatory Visit: Payer: Self-pay | Admitting: Physical Medicine & Rehabilitation

## 2021-07-18 ENCOUNTER — Other Ambulatory Visit: Payer: Self-pay

## 2021-07-18 ENCOUNTER — Ambulatory Visit (INDEPENDENT_AMBULATORY_CARE_PROVIDER_SITE_OTHER): Payer: Medicare Other

## 2021-07-18 VITALS — BP 122/70 | HR 80 | Temp 98.1°F | Resp 16 | Ht 68.0 in | Wt 190.0 lb

## 2021-07-18 DIAGNOSIS — Z23 Encounter for immunization: Secondary | ICD-10-CM | POA: Diagnosis not present

## 2021-07-18 DIAGNOSIS — Z0000 Encounter for general adult medical examination without abnormal findings: Secondary | ICD-10-CM | POA: Diagnosis not present

## 2021-07-18 NOTE — Progress Notes (Signed)
Subjective:   David Kim is a 75 y.o. male who presents for Medicare Annual/Subsequent preventive examination.  Review of Systems     Cardiac Risk Factors include: advanced age (>10mn, >>36women);dyslipidemia;family history of premature cardiovascular disease;hypertension;male gender     Objective:    Today's Vitals   07/18/21 1553  BP: 122/70  Pulse: 80  Resp: 16  Temp: 98.1 F (36.7 C)  SpO2: 94%  Weight: 190 lb (86.2 kg)  Height: '5\' 8"'$  (1.727 m)  PainSc: 0-No pain   Body mass index is 28.89 kg/m.  Advanced Directives 07/18/2021 09/22/2019 09/15/2019 09/06/2018 10/06/2016 07/25/2016 06/05/2016  Does Patient Have a Medical Advance Directive? No No No No No No No  Would patient like information on creating a medical advance directive? No - Patient declined No - Patient declined No - Patient declined No - Patient declined - - No - patient declined information    Current Medications (verified) Outpatient Encounter Medications as of 07/18/2021  Medication Sig   aspirin 81 MG EC tablet Take 81 mg by mouth daily.   baclofen (LIORESAL) 20 MG tablet TAKE 1 TABLET(20 MG) BY MOUTH FOUR TIMES DAILY   calcium citrate-vitamin D 500-500 MG-UNIT chewable tablet Chew 1 tablet by mouth daily.   cholecalciferol (VITAMIN D3) 25 MCG (1000 UT) tablet Take 1,000 Units by mouth daily.   clonazePAM (KLONOPIN) 2 MG tablet TAKE 2 TABLETS(4 MG) BY MOUTH AT BEDTIME   losartan (COZAAR) 50 MG tablet TAKE 1 TABLET(50 MG) BY MOUTH TWICE DAILY   Multiple Vitamin (MULTIVITAMIN) capsule Take 1 capsule by mouth daily.   oxybutynin (DITROPAN) 5 MG tablet Take 5 mg by mouth 4 (four) times daily.   tiZANidine (ZANAFLEX) 4 MG tablet TAKE 1-2 TABLETS BY MOUTH FOUR TIMES DAILY   vitamin C (ASCORBIC ACID) 500 MG tablet Take 500 mg by mouth daily.   Vitamin D, Ergocalciferol, (DRISDOL) 1.25 MG (50000 UNIT) CAPS capsule Take 1 capsule (50,000 Units total) by mouth every 7 (seven) days.   bisacodyl (DULCOLAX) 10 MG  suppository Place 1 suppository (10 mg total) rectally daily after supper. (Patient not taking: Reported on 07/18/2021)   saccharomyces boulardii (FLORASTOR) 250 MG capsule Take 1 capsule (250 mg total) by mouth 2 (two) times daily. (Patient not taking: Reported on 07/18/2021)   vitamin E 180 MG (400 UNITS) capsule Take 800 Units by mouth daily. (Patient not taking: Reported on 07/18/2021)   No facility-administered encounter medications on file as of 07/18/2021.    Allergies (verified) Amoxicillin and Endal hd   History: Past Medical History:  Diagnosis Date   Allergic rhinitis    ALLERGIC RHINITIS 10/01/2007   Qualifier: Diagnosis of  By: JJenny ReichmannMD, JHunt Oris   Anxiety    Carpal tunnel syndrome    right   Depression    Depression    Erectile dysfunction    Fatigue    GERD (gastroesophageal reflux disease)    Gout    Hypertension    HYPOGONADISM 03/31/2008   Qualifier: Diagnosis of  By: JJenny ReichmannMD, JHunt Oris   IBS (irritable bowel syndrome)    Low back pain    OSA (obstructive sleep apnea)    Paraplegia ([place] 04/23/2009   Qualifier: Diagnosis of  By: JJenny ReichmannMD, JHunt Oris   Peptic ulcer disease    PEPTIC ULCER DISEASE 10/01/2007   Qualifier: Diagnosis of  By: JJenny ReichmannMD, JHunt Oris   Prostate cancer ([facility]    PROSTATE CANCER, HX OF  05/23/2007   Qualifier: Diagnosis of  By: Jenny Reichmann MD, [place] PARALYSIS 04/23/2009   Qualifier: Diagnosis of  By: Jenny Reichmann MD, Hunt Oris    Thoracic spinal cord injury [facility]) 03/12/2012   Past Surgical History:  Procedure Laterality Date   ARTERY AND TENDON REPAIR Left 09/06/2018   Procedure: ARTERY AND TENDON REPAIR;  Surgeon: Charlotte Crumb, MD;  Location: [facility];  Service: Orthopedics;  Laterality: Left;   CAST APPLICATION Left [id number]   Procedure: CAST APPLICATION;  Surgeon: Charlotte Crumb, MD;  Location: [facility];  Service: Orthopedics;  Laterality: Left;   inguinal heniorrhaphy     left leg surgery after fibula     NERVE REPAIR Left 09/06/2018    Procedure: NERVE REPAIR TIMES TWO;  Surgeon: Charlotte Crumb, MD;  Location: [facility];  Service: Orthopedics;  Laterality: Left;   OPEN REDUCTION INTERNAL FIXATION (ORIF) DISTAL PHALANX Left 09/06/2018   Procedure: OPEN REDUCTION INTERNAL FIXATION (ORIF) LEFT LONG FINGER;  Surgeon: Charlotte Crumb, MD;  Location: [facility];  Service: Orthopedics;  Laterality: Left;   PILONIDAL CYST / SINUS EXCISION     PROSTATECTOMY     tonsillectomy     WOUND EXPLORATION Left 09/06/2018   Procedure: EXPLORATION OFCOMPLEX INJURY;  Surgeon: Charlotte Crumb, MD;  Location: [facility];  Service: Orthopedics;  Laterality: Left;   Family History  Problem Relation Age of Onset   High blood pressure Mother    Dementia Mother    Diabetes Father    Social History   Socioeconomic History   Marital status: Married    Spouse name: [name]   Number of children: 0   Years of education: 12   Highest education level: Not on file  Occupational History   Occupation: retired    Fish farm manager: RETIRED  Tobacco Use   Smoking status: Never   Smokeless tobacco: Never  Vaping Use   Vaping Use: Never used  Substance and Sexual Activity   Alcohol use: No   Drug use: No   Sexual activity: Not on file  Other Topics Concern   Not on file  Social History Narrative   Patient lives at home with [name]). Patient is disabled. Patient has 12 th grade education.   Right handed.   Caffeine- None   Social Determinants of Health   Financial Resource Strain: Low Risk    Difficulty of Paying Living Expenses: Not hard at all  Food Insecurity: No Food Insecurity   Worried About Charity fundraiser in the Last Year: Never true   Ran Out of Food in the Last Year: Never true  Transportation Needs: No Transportation Needs   Lack of Transportation (Medical): No   Lack of Transportation (Non-Medical): No  Physical Activity: Inactive   Days of Exercise per Week: 0 days   Minutes of Exercise per Session: 0 min  Stress: No Stress  Concern Present   Feeling of Stress : Not at all  Social Connections: Moderately Isolated   Frequency of Communication with Friends and Family: More than three times a week   Frequency of Social Gatherings with Friends and Family: More than three times a week   Attends Religious Services: Never   Marine scientist or Organizations: No   Attends Music therapist: Never   Marital Status: Married    Tobacco Counseling Counseling given: Not Answered   Clinical Intake:  Pre-visit preparation completed: Yes  Pain : No/denies pain Pain Score: 0-No pain  BMI - recorded: 28.89 Nutritional Status: BMI 25 -29 Overweight Nutritional Risks: None Diabetes: No  How often do you need to have someone help you when you read instructions, pamphlets, or other written materials from your doctor or pharmacy?: 1 - Never What is the last grade level you completed in school?: High School Graduate  Diabetic? no  Interpreter Needed?: No  Information entered by :: David Abu, LPN   Activities of Daily Living In your present state of health, do you have any difficulty performing the following activities: 07/18/2021 01/19/2021  Hearing? Y N  Vision? N N  Difficulty concentrating or making decisions? N N  Walking or climbing stairs? - Y  Dressing or bathing? Y Y  Doing errands, shopping? Tempie Donning  Preparing Food and eating ? Y -  Comment with preparing food -  Using the Toilet? Y -  In the past six months, have you accidently leaked urine? Y -  Do you have problems with loss of bowel control? Y -  Managing your Medications? Y -  Managing your Finances? Y -  Housekeeping or managing your Housekeeping? Y -  Some recent data might be hidden    Patient Care Team: Biagio Borg, MD as PCP - General  Indicate any recent Medical Services you may have received from other than [facility] providers in the past year (date may be approximate).     Assessment:   This is a routine  wellness examination for [place].  Hearing/Vision screen Hearing Screening - Comments:: Patient has issue with tinnitus due to working in high frequency.  No hearing aids. Vision Screening - Comments:: Patient wears eye glasses.  Eye appointment scheduled for 08/10/2021 with [facility].  Dietary issues and exercise activities discussed: Current Exercise Habits: The patient does not participate in regular exercise at present, Exercise limited by: neurologic condition(s)   Goals Addressed   None   Depression Screen PHQ 2/9 Scores 07/18/2021 01/19/2021 01/19/2021 01/09/2020 07/02/2019 01/01/2019 03/29/2018  PHQ - 2 Score 0 '1 1 1 1 1 1    '$ Fall Risk Fall Risk  07/18/2021 05/11/2021 03/09/2021 01/19/2021 03/10/2020  Falls in the past year? 0 1 0 0 0  Comment - - - - -  Number falls in past yr: 0 0 - - -  Injury with Fall? 0 0 - - -  Comment - - - - -  Risk for fall due to : - - - - -    FALL RISK PREVENTION PERTAINING TO THE HOME:  Any stairs in or around the home? Yes  If so, are there any without handrails? No  Home free of loose throw rugs in walkways, pet beds, electrical cords, etc? Yes  Adequate lighting in your home to reduce risk of falls? Yes   ASSISTIVE DEVICES UTILIZED TO PREVENT FALLS:  Life alert? No  Use of a cane, walker or w/c? Yes  Grab bars in the bathroom? Yes  Shower chair or bench in shower? Yes  Elevated toilet seat or a handicapped toilet? Yes   TIMED UP AND GO:  Was the test performed? No .  Length of time to ambulate 10 feet: n/a sec.   Appearance of gait: patient is confined to wheelchair   Cognitive Function: Normal cognitive status assessed by direct observation by this Nurse Health Advisor. No abnormalities found.          Immunizations Immunization History  Administered Date(s) Administered   Fluad Quad(high Dose 65+) 07/02/2019, 07/22/2020  Influenza Split 07/31/2011, 07/30/2012   Influenza Whole 10/01/2007, 07/28/2008, 08/04/2009, 08/03/2010    Influenza, High Dose Seasonal PF 09/27/2016, 10/03/2017   Influenza,inj,Quad PF,6+ Mos 07/09/2013, 07/23/2014, 09/07/2015   Influenza-Unspecified 07/08/2018   PFIZER(Purple Top)SARS-COV-2 Vaccination 11/28/2019, 12/19/2019   Pneumococcal Conjugate-13 04/15/2014   Pneumococcal Polysaccharide-23 10/12/2008, 10/21/2014   Td 07/28/2008   Tdap 09/06/2018   Zoster Recombinat (Shingrix) 03/29/2018, 09/05/2018   Zoster, Live 10/12/2008    TDAP status: Up to date  Flu Vaccine status: Up to date  Pneumococcal vaccine status: Up to date  Covid-19 vaccine status: Completed vaccines  Qualifies for Shingles Vaccine? Yes   Zostavax completed Yes   Shingrix Completed?: Yes  Screening Tests Health Maintenance  Topic Date Due   INFLUENZA VACCINE  06/06/2021   COLONOSCOPY (Pts 45-52yr Insurance coverage will need to be confirmed)  01/19/2022 (Originally 02/02/1993)   TETANUS/TDAP  09/06/2028   Hepatitis C Screening  Completed   PNA vac Low Risk Adult  Completed   Zoster Vaccines- Shingrix  Completed   HPV VACCINES  Aged Out   COVID-19 Vaccine  Discontinued    Health Maintenance  Health Maintenance Due  Topic Date Due   INFLUENZA VACCINE  06/06/2021    Colorectal cancer screening: No longer required.   Lung Cancer Screening: (Low Dose CT Chest recommended if Age 73-80years, 30 pack-year currently smoking OR have quit w/in 15years.) does not qualify.   Lung Cancer Screening Referral: no  Additional Screening:  Hepatitis C Screening: does qualify; Completed: yes  Vision Screening: Recommended annual ophthalmology exams for early detection of glaucoma and other disorders of the eye. Is the patient up to date with their annual eye exam?  Yes  Who is the provider or what is the name of the office in which the patient attends annual eye exams? [facility](scheduled for [facility]/2021) If pt is not established with a provider, would they like to be referred to a provider to establish  care? No .   Dental Screening: Recommended annual dental exams for proper oral hygiene  Community Resource Referral / Chronic Care Management: CRR required this visit?  No   CCM required this visit?  No      Plan:     I have personally reviewed and noted the following in the patient's chart:   Medical and social history Use of alcohol, tobacco or illicit drugs  Current medications and supplements including opioid prescriptions. Patient is not currently taking opioid prescriptions. Functional ability and status Nutritional status Physical activity Advanced directives List of other physicians Hospitalizations, surgeries, and ER visits in previous 12 months Vitals Screenings to include cognitive, depression, and falls Referrals and appointments  In addition, I have reviewed and discussed with patient certain preventive protocols, quality metrics, and best practice recommendations. A written personalized care plan for preventive services as well as general preventive health recommendations were provided to patient.     SSheral Flow LPN   [id number]  Nurse Notes:  Hearing Screening - Comments:: Patient has issue with tinnitus due to working in high frequency.  No hearing aids. Vision Screening - Comments:: Patient wears eye glasses.  [facility]

## 2021-07-18 NOTE — Patient Instructions (Signed)
David Kim , Thank you for taking time to come for your Medicare Wellness Visit. I appreciate your ongoing commitment to your health goals. Please review the following plan we discussed and let me know if I can assist you in the future.   Screening recommendations/referrals: Colonoscopy: Not a candidate for colon cancer screening Recommended yearly ophthalmology/optometry visit for glaucoma screening and checkup Recommended yearly dental visit for hygiene and checkup  Vaccinations: Influenza vaccine: 07/18/2021 Pneumococcal vaccine: 04/15/2014, 10/21/2014 Tdap vaccine: 09/06/2018; due every 10 years Shingles vaccine: 03/29/2018, 09/05/2018   Covid-19: 11/28/2019, 12/19/2019  Advanced directives: Advance directive discussed with you today. Even though you declined this today please call our office should you change your mind and we can give you the proper paperwork for you to fill out.  Conditions/risks identified: Yes; No goals at this time.  Next appointment: Please schedule your next Medicare Wellness Visit with your Nurse Health Advisor in 1 year by calling [phone].  Preventive Care 73 Years and Older, Male  Preventive care refers to lifestyle choices and visits with your health care provider that can promote health and wellness. What does preventive care include? A yearly physical exam. This is also called an annual well check. Dental exams once or twice a year. Routine eye exams. Ask your health care provider how often you should have your eyes checked. Personal lifestyle choices, including: Daily care of your teeth and gums. Regular physical activity. Eating a healthy diet. Avoiding tobacco and drug use. Limiting alcohol use. Practicing safe sex. Taking low doses of aspirin every day. Taking vitamin and mineral supplements as recommended by your health care provider. What happens during an annual well check? The services and screenings done by your health care provider  during your annual well check will depend on your age, overall health, lifestyle risk factors, and family history of disease. Counseling  Your health care provider may ask you questions about your: Alcohol use. Tobacco use. Drug use. Emotional well-being. Home and relationship well-being. Sexual activity. Eating habits. History of falls. Memory and ability to understand (cognition). Work and work Statistician. Screening  You may have the following tests or measurements: Height, weight, and BMI. Blood pressure. Lipid and cholesterol levels. These may be checked every 5 years, or more frequently if you are over 25 years old. Skin check. Lung cancer screening. You may have this screening every year starting at age 73 if you have a 30-pack-year history of smoking and currently smoke or have quit within the past 15 years. Fecal occult blood test (FOBT) of the stool. You may have this test every year starting at age 1. Flexible sigmoidoscopy or colonoscopy. You may have a sigmoidoscopy every 5 years or a colonoscopy every 10 years starting at age 73. Prostate cancer screening. Recommendations will vary depending on your family history and other risks. Hepatitis C blood test. Hepatitis B blood test. Sexually transmitted disease (STD) testing. Diabetes screening. This is done by checking your blood sugar (glucose) after you have not eaten for a while (fasting). You may have this done every 1-3 years. Abdominal aortic aneurysm (AAA) screening. You may need this if you are a current or former smoker. Osteoporosis. You may be screened starting at age 2 if you are at high risk. Talk with your health care provider about your test results, treatment options, and if necessary, the need for more tests. Vaccines  Your health care provider may recommend certain vaccines, such as: Influenza vaccine. This is recommended every year. Tetanus, diphtheria,  and acellular pertussis (Tdap, Td) vaccine. You  may need a Td booster every 10 years. Zoster vaccine. You may need this after age 12. Pneumococcal 13-valent conjugate (PCV13) vaccine. One dose is recommended after age 11. Pneumococcal polysaccharide (PPSV23) vaccine. One dose is recommended after age 13. Talk to your health care provider about which screenings and vaccines you need and how often you need them. This information is not intended to replace advice given to you by your health care provider. Make sure you discuss any questions you have with your health care provider. Document Released: 11/19/2015 Document Revised: 07/12/2016 Document Reviewed: 08/24/2015 Elsevier Interactive Patient Education  2017 [place] Prevention in the Home Falls can cause injuries. They can happen to people of all ages. There are many things you can do to make your home safe and to help prevent falls. What can I do on the outside of my home? Regularly fix the edges of walkways and driveways and fix any cracks. Remove anything that might make you trip as you walk through a door, such as a raised step or threshold. Trim any bushes or trees on the path to your home. Use bright outdoor lighting. Clear any walking paths of anything that might make someone trip, such as rocks or tools. Regularly check to see if handrails are loose or broken. Make sure that both sides of any steps have handrails. Any raised decks and porches should have guardrails on the edges. Have any leaves, snow, or ice cleared regularly. Use sand or salt on walking paths during winter. Clean up any spills in your garage right away. This includes oil or grease spills. What can I do in the bathroom? Use night lights. Install grab bars by the toilet and in the tub and shower. Do not use towel bars as grab bars. Use non-skid mats or decals in the tub or shower. If you need to sit down in the shower, use a plastic, non-slip stool. Keep the floor dry. Clean up any water that spills  on the floor as soon as it happens. Remove soap buildup in the tub or shower regularly. Attach bath mats securely with double-sided non-slip rug tape. Do not have throw rugs and other things on the floor that can make you trip. What can I do in the bedroom? Use night lights. Make sure that you have a light by your bed that is easy to reach. Do not use any sheets or blankets that are too big for your bed. They should not hang down onto the floor. Have a firm chair that has side arms. You can use this for support while you get dressed. Do not have throw rugs and other things on the floor that can make you trip. What can I do in the kitchen? Clean up any spills right away. Avoid walking on wet floors. Keep items that you use a lot in easy-to-reach places. If you need to reach something above you, use a strong step stool that has a grab bar. Keep electrical cords out of the way. Do not use floor polish or wax that makes floors slippery. If you must use wax, use non-skid floor wax. Do not have throw rugs and other things on the floor that can make you trip. What can I do with my stairs? Do not leave any items on the stairs. Make sure that there are handrails on both sides of the stairs and use them. Fix handrails that are broken or loose. Make sure  that handrails are as long as the stairways. Check any carpeting to make sure that it is firmly attached to the stairs. Fix any carpet that is loose or worn. Avoid having throw rugs at the top or bottom of the stairs. If you do have throw rugs, attach them to the floor with carpet tape. Make sure that you have a light switch at the top of the stairs and the bottom of the stairs. If you do not have them, ask someone to add them for you. What else can I do to help prevent falls? Wear shoes that: Do not have high heels. Have rubber bottoms. Are comfortable and fit you well. Are closed at the toe. Do not wear sandals. If you use a stepladder: Make  sure that it is fully opened. Do not climb a closed stepladder. Make sure that both sides of the stepladder are locked into place. Ask someone to hold it for you, if possible. Clearly mark and make sure that you can see: Any grab bars or handrails. First and last steps. Where the edge of each step is. Use tools that help you move around (mobility aids) if they are needed. These include: Canes. Walkers. Scooters. Crutches. Turn on the lights when you go into a dark area. Replace any light bulbs as soon as they burn out. Set up your furniture so you have a clear path. Avoid moving your furniture around. If any of your floors are uneven, fix them. If there are any pets around you, be aware of where they are. Review your medicines with your doctor. Some medicines can make you feel dizzy. This can increase your chance of falling. Ask your doctor what other things that you can do to help prevent falls. This information is not intended to replace advice given to you by your health care provider. Make sure you discuss any questions you have with your health care provider. Document Released: 08/19/2009 Document Revised: 03/30/2016 Document Reviewed: 11/27/2014 Elsevier Interactive Patient Education  2017 [employer].

## 2021-07-20 ENCOUNTER — Encounter: Payer: Medicare Other | Admitting: Physical Medicine & Rehabilitation

## 2021-08-09 ENCOUNTER — Encounter: Payer: Self-pay | Admitting: Internal Medicine

## 2021-08-09 NOTE — Assessment & Plan Note (Signed)
Benefits from CPAP with good compliance and control Plan- continue auto 5-15 

## 2021-08-09 NOTE — Assessment & Plan Note (Signed)
L hemiplegia. Followed by Neurology and Rehab medicine

## 2021-08-10 DIAGNOSIS — H25813 Combined forms of age-related cataract, bilateral: Secondary | ICD-10-CM | POA: Diagnosis not present

## 2021-09-28 ENCOUNTER — Encounter: Payer: Medicare Other | Admitting: Physical Medicine & Rehabilitation

## 2021-10-03 DIAGNOSIS — Z8546 Personal history of malignant neoplasm of prostate: Secondary | ICD-10-CM | POA: Diagnosis not present

## 2021-10-10 DIAGNOSIS — N319 Neuromuscular dysfunction of bladder, unspecified: Secondary | ICD-10-CM | POA: Diagnosis not present

## 2021-10-10 DIAGNOSIS — N200 Calculus of kidney: Secondary | ICD-10-CM | POA: Diagnosis not present

## 2021-10-10 DIAGNOSIS — N3946 Mixed incontinence: Secondary | ICD-10-CM | POA: Diagnosis not present

## 2021-10-10 DIAGNOSIS — Z8546 Personal history of malignant neoplasm of prostate: Secondary | ICD-10-CM | POA: Diagnosis not present

## 2021-10-10 DIAGNOSIS — N3281 Overactive bladder: Secondary | ICD-10-CM | POA: Diagnosis not present

## 2021-11-06 ENCOUNTER — Other Ambulatory Visit: Payer: Self-pay | Admitting: Internal Medicine

## 2021-11-06 NOTE — Telephone Encounter (Signed)
Please refill as per office routine med refill policy (all routine meds to be refilled for 3 mo or monthly (per pt preference) up to one year from last visit, then month to month grace period for 3 mo, then further med refills will have to be denied) ? ?

## 2021-12-06 ENCOUNTER — Other Ambulatory Visit: Payer: Self-pay | Admitting: Physical Medicine & Rehabilitation

## 2021-12-06 DIAGNOSIS — G839 Paralytic syndrome, unspecified: Secondary | ICD-10-CM

## 2021-12-06 DIAGNOSIS — S24109S Unspecified injury at unspecified level of thoracic spinal cord, sequela: Secondary | ICD-10-CM

## 2021-12-06 DIAGNOSIS — G8220 Paraplegia, unspecified: Secondary | ICD-10-CM

## 2021-12-21 ENCOUNTER — Encounter: Payer: Medicare Other | Attending: Physical Medicine & Rehabilitation | Admitting: Physical Medicine & Rehabilitation

## 2022-01-10 DIAGNOSIS — H25813 Combined forms of age-related cataract, bilateral: Secondary | ICD-10-CM | POA: Diagnosis not present

## 2022-01-10 DIAGNOSIS — H43813 Vitreous degeneration, bilateral: Secondary | ICD-10-CM | POA: Diagnosis not present

## 2022-01-23 ENCOUNTER — Ambulatory Visit: Payer: Medicare Other | Admitting: Internal Medicine

## 2022-01-31 ENCOUNTER — Ambulatory Visit (INDEPENDENT_AMBULATORY_CARE_PROVIDER_SITE_OTHER): Payer: Medicare Other | Admitting: Internal Medicine

## 2022-01-31 ENCOUNTER — Encounter: Payer: Self-pay | Admitting: Internal Medicine

## 2022-01-31 VITALS — BP 114/68 | HR 67 | Temp 98.5°F | Ht 68.0 in

## 2022-01-31 DIAGNOSIS — E7800 Pure hypercholesterolemia, unspecified: Secondary | ICD-10-CM

## 2022-01-31 DIAGNOSIS — R739 Hyperglycemia, unspecified: Secondary | ICD-10-CM | POA: Diagnosis not present

## 2022-01-31 DIAGNOSIS — E559 Vitamin D deficiency, unspecified: Secondary | ICD-10-CM | POA: Diagnosis not present

## 2022-01-31 DIAGNOSIS — E538 Deficiency of other specified B group vitamins: Secondary | ICD-10-CM

## 2022-01-31 DIAGNOSIS — I10 Essential (primary) hypertension: Secondary | ICD-10-CM | POA: Diagnosis not present

## 2022-01-31 DIAGNOSIS — Z8546 Personal history of malignant neoplasm of prostate: Secondary | ICD-10-CM | POA: Diagnosis not present

## 2022-01-31 LAB — LIPID PANEL
Cholesterol: 137 mg/dL (ref 0–200)
HDL: 36.7 mg/dL — ABNORMAL LOW (ref >39.00)
LDL Cholesterol: 64 mg/dL (ref 0–99)
NonHDL: 100.57
Total CHOL/HDL Ratio: 4
Triglycerides: 185 mg/dL — ABNORMAL HIGH (ref 0.0–149.0)
VLDL: 37 mg/dL (ref 0.0–40.0)

## 2022-01-31 LAB — CBC WITH DIFFERENTIAL/PLATELET
Basophils Absolute: 0.1 10*3/uL (ref 0.0–0.1)
Basophils Relative: 0.6 % (ref 0.0–3.0)
Eosinophils Absolute: 0.8 10*3/uL — ABNORMAL HIGH (ref 0.0–0.7)
Eosinophils Relative: 9.9 % — ABNORMAL HIGH (ref 0.0–5.0)
HCT: 48.7 % (ref 39.0–52.0)
Hemoglobin: 16.2 g/dL (ref 13.0–17.0)
Lymphocytes Relative: 25.3 % (ref 12.0–46.0)
Lymphs Abs: 2.1 10*3/uL (ref 0.7–4.0)
MCHC: 33.2 g/dL (ref 30.0–36.0)
MCV: 90.3 fl (ref 78.0–100.0)
Monocytes Absolute: 0.7 10*3/uL (ref 0.1–1.0)
Monocytes Relative: 8.4 % (ref 3.0–12.0)
Neutro Abs: 4.7 10*3/uL (ref 1.4–7.7)
Neutrophils Relative %: 55.8 % (ref 43.0–77.0)
Platelets: 208 10*3/uL (ref 150.0–400.0)
RBC: 5.4 Mil/uL (ref 4.22–5.81)
RDW: 14.2 % (ref 11.5–15.5)
WBC: 8.4 10*3/uL (ref 4.0–10.5)

## 2022-01-31 LAB — PSA: PSA: 0.01 ng/mL — ABNORMAL LOW (ref 0.10–4.00)

## 2022-01-31 LAB — BASIC METABOLIC PANEL
BUN: 18 mg/dL (ref 6–23)
CO2: 24 mEq/L (ref 19–32)
Calcium: 9.4 mg/dL (ref 8.4–10.5)
Chloride: 102 mEq/L (ref 96–112)
Creatinine, Ser: 0.9 mg/dL (ref 0.40–1.50)
GFR: 84.44 mL/min (ref >60.00)
Glucose, Bld: 86 mg/dL (ref 70–99)
Potassium: 3.4 mEq/L — ABNORMAL LOW (ref 3.5–5.1)
Sodium: 138 mEq/L (ref 135–145)

## 2022-01-31 LAB — TSH: TSH: 1.81 u[IU]/mL (ref 0.35–5.50)

## 2022-01-31 LAB — HEPATIC FUNCTION PANEL
ALT: 39 U/L (ref 0–53)
AST: 35 U/L (ref 0–37)
Albumin: 4.8 g/dL (ref 3.5–5.2)
Alkaline Phosphatase: 54 U/L (ref 39–117)
Bilirubin, Direct: 0.2 mg/dL (ref 0.0–0.3)
Total Bilirubin: 1 mg/dL (ref 0.2–1.2)
Total Protein: 7 g/dL (ref 6.0–8.3)

## 2022-01-31 LAB — VITAMIN D 25 HYDROXY (VIT D DEFICIENCY, FRACTURES): VITD: 44.98 ng/mL (ref 30.00–100.00)

## 2022-01-31 LAB — VITAMIN B12: Vitamin B-12: 358 pg/mL (ref 211–911)

## 2022-01-31 MED ORDER — LOSARTAN POTASSIUM 50 MG PO TABS: ORAL_TABLET | ORAL | 3 refills | Status: DC

## 2022-01-31 NOTE — Assessment & Plan Note (Signed)
Lab Results  ?Component Value Date  ? [place] 65 01/01/2019  ? ?Stable, pt to continue current low chol diet ? ?

## 2022-01-31 NOTE — Patient Instructions (Signed)

## 2022-01-31 NOTE — Assessment & Plan Note (Signed)
BP Readings from Last 3 Encounters:  ?01/31/22 114/68  ?07/18/21 122/70  ?05/11/21 105/69  ? ?Stable, pt to continue medical treatment losartan ? ?

## 2022-01-31 NOTE — Assessment & Plan Note (Signed)
Lab Results  ?Component Value Date  ? HGBA1C 5.5 01/19/2021  ? ?Stable, pt to continue current medical treatment  - diet ? ?

## 2022-01-31 NOTE — Assessment & Plan Note (Signed)
Last vitamin D ?Lab Results  ?Component Value Date  ? VD25OH 51.95 01/19/2021  ? ?Stable, cont oral replacement ? ?

## 2022-01-31 NOTE — Progress Notes (Signed)
Patient ID: David Kim, male   DOB: November 23, 1945, 76 y.o.   MRN: [id number] ? ? ? ?     Chief Complaint:: yearly exam ? ?     HPI:  David Kim is a 76 y.o. male here for above; declines colnoscopy and cologuard; Pt denies chest pain, increased sob or doe, wheezing, orthopnea, PND, increased LE swelling, palpitations, dizziness or syncope.   Pt denies polydipsia, polyuria, or new focal neuro s/s.    Pt denies fever, wt loss, night sweats, loss of appetite, or other constitutional symptoms  No new complaints  Taking Vit D 10k unit qod. Denies urinary symptoms such as dysuria, frequency, urgency, flank pain, hematuria or n/v, fever, chills.  ?  ?Wt Readings from Last 3 Encounters:  ?07/18/21 190 lb (86.2 kg)  ?03/14/21 190 lb (86.2 kg)  ?03/11/20 180 lb (81.6 kg)  ? ?BP Readings from Last 3 Encounters:  ?01/31/22 114/68  ?07/18/21 122/70  ?05/11/21 105/69  ? ?Immunization History  ?Administered Date(s) Administered  ? Fluad Quad(high Dose 65+) 07/02/2019, 07/22/2020, 07/18/2021  ? Influenza Split 07/31/2011, 07/30/2012  ? Influenza Whole 10/01/2007, 07/28/2008, 08/04/2009, 08/03/2010  ? Influenza, High Dose Seasonal PF 09/27/2016, 10/03/2017  ? Influenza,inj,Quad PF,6+ Mos 07/09/2013, 07/23/2014, 09/07/2015  ? Influenza-Unspecified 07/08/2018  ? PFIZER(Purple Top)SARS-COV-2 Vaccination 11/28/2019, 12/19/2019  ? Pneumococcal Conjugate-13 04/15/2014  ? Pneumococcal Polysaccharide-23 10/12/2008, 10/21/2014  ? Td 07/28/2008  ? Tdap 09/06/2018  ? Zoster Recombinat (Shingrix) 03/29/2018, 09/05/2018  ? Zoster, Live 10/12/2008  ? ?There are no preventive care reminders to display for this patient. ? ?  ? ?Past Medical History:  ?Diagnosis Date  ? Allergic rhinitis   ? ALLERGIC RHINITIS 10/01/2007  ? Qualifier: Diagnosis of  By: Jenny Reichmann MD, Hunt Oris   ? Anxiety   ? Carpal tunnel syndrome   ? right  ? Depression   ? Depression   ? Erectile dysfunction   ? Fatigue   ? GERD (gastroesophageal reflux disease)   ? Gout   ?  Hypertension   ? HYPOGONADISM 03/31/2008  ? Qualifier: Diagnosis of  By: Jenny Reichmann MD, Hunt Oris   ? IBS (irritable bowel syndrome)   ? Low back pain   ? OSA (obstructive sleep apnea)   ? Paraplegia ([place]) 04/23/2009  ? Qualifier: Diagnosis of  By: Jenny Reichmann MD, Hunt Oris   ? Peptic ulcer disease   ? PEPTIC ULCER DISEASE 10/01/2007  ? Qualifier: Diagnosis of  By: Jenny Reichmann MD, Hunt Oris   ? Prostate cancer ([place])   ? PROSTATE CANCER, HX OF 05/23/2007  ? Qualifier: Diagnosis of  By: Jenny Reichmann MD, Hunt Oris   ? 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  ? Qualifier: Diagnosis of  By: Jenny Reichmann MD, Hunt Oris   ? Thoracic spinal cord injury ([place]) 03/12/2012  ? ?Past Surgical History:  ?Procedure Laterality Date  ? ARTERY AND TENDON REPAIR Left 09/06/2018  ? Procedure: ARTERY AND TENDON REPAIR;  Surgeon: Charlotte Crumb, MD;  Location: [place];  Service: Orthopedics;  Laterality: Left;  ? CAST APPLICATION Left 95/04/2129  ? Procedure: CAST APPLICATION;  Surgeon: Charlotte Crumb, MD;  Location: [place];  Service: Orthopedics;  Laterality: Left;  ? inguinal heniorrhaphy    ? left leg surgery after fibula    ? NERVE REPAIR Left 09/06/2018  ? Procedure: NERVE REPAIR TIMES TWO;  Surgeon: Charlotte Crumb, MD;  Location: [place];  Service: Orthopedics;  Laterality: Left;  ? OPEN REDUCTION INTERNAL FIXATION (ORIF) DISTAL PHALANX Left 09/06/2018  ? Procedure: OPEN REDUCTION INTERNAL  FIXATION (ORIF) LEFT LONG FINGER;  Surgeon: Charlotte Crumb, MD;  Location: [place];  Service: Orthopedics;  Laterality: Left;  ? PILONIDAL CYST / SINUS EXCISION    ? PROSTATECTOMY    ? tonsillectomy    ? WOUND EXPLORATION Left 09/06/2018  ? Procedure: EXPLORATION OFCOMPLEX INJURY;  Surgeon: Charlotte Crumb, MD;  Location: [place];  Service: Orthopedics;  Laterality: Left;  ? ? reports that he has never smoked. He has never used smokeless tobacco. He reports that he does not drink alcohol and does not use drugs. ?family history includes Dementia in his mother; Diabetes in his father; High blood pressure in his  mother. ?Allergies  ?Allergen Reactions  ? Amoxicillin Other (See Comments)  ? Endal Hd Other (See Comments)  ? ?Current Outpatient Medications on File Prior to Visit  ?Medication Sig Dispense Refill  ? aspirin 81 MG EC tablet Take 81 mg by mouth daily.    ? baclofen (LIORESAL) 20 MG tablet TAKE 1 TABLET(20 MG) BY MOUTH FOUR TIMES DAILY 360 tablet 4  ? calcium citrate-vitamin D 500-500 MG-UNIT chewable tablet Chew 1 tablet by mouth daily.    ? cholecalciferol (VITAMIN D3) 25 MCG (1000 UT) tablet Take 1,000 Units by mouth daily.    ? clonazePAM (KLONOPIN) 2 MG tablet TAKE 2 TABLETS(4 MG) BY MOUTH AT BEDTIME 180 tablet 1  ? Multiple Vitamin (MULTIVITAMIN) capsule Take 1 capsule by mouth daily.    ? Multiple Vitamins-Minerals (ZINC PO) Take by mouth.    ? oxybutynin (DITROPAN) 5 MG tablet Take 5 mg by mouth 4 (four) times daily.    ? tiZANidine (ZANAFLEX) 4 MG tablet TAKE 1-2 TABLETS BY MOUTH FOUR TIMES DAILY 620 tablet 3  ? vitamin C (ASCORBIC ACID) 500 MG tablet Take 500 mg by mouth daily.    ? ?No current facility-administered medications on file prior to visit.  ? ?     ROS:  All others reviewed and negative. ? ?Objective  ? ?     PE:  BP 114/68 (BP Location: Left Arm, Patient Position: Sitting, Cuff Size: Large)   Pulse 67   Temp 98.5 ?F (36.9 ?C) (Oral)   Ht '5\' 8"'$  (1.727 m)   SpO2 98%   BMI 28.89 kg/m?  ? ?              Constitutional: Pt appears in NAD ?              HENT: Head: NCAT.  ?              Right Ear: External ear normal.   ?              Left Ear: External ear normal.  ?              Eyes: . Pupils are equal, round, and reactive to light. Conjunctivae and EOM are normal ?              Nose: without d/c or deformity ?              Neck: Neck supple. Gross normal ROM ?              Cardiovascular: Normal rate and regular rhythm.   ?              Pulmonary/Chest: Effort normal and breath sounds without rales or wheezing.  ?              Abd:  Soft, NT, ND, +  BS, no organomegaly ?               Neurological: Pt is alert. At baseline orientation, motor with paraplegia no change ?              Skin: Skin is warm. No rashes, no other new lesions, LE edema - none ?              Psychiatric: Pt behavior is normal without agitation  ? ?Micro: none ? ?Cardiac tracings I have personally interpreted today:  none ? ?Pertinent Radiological findings (summarize): none  ? ?Lab Results  ?Component Value Date  ? WBC 8.1 01/19/2021  ? HGB 15.4 01/19/2021  ? HCT 45.5 01/19/2021  ? PLT 202.0 01/19/2021  ? GLUCOSE 80 01/19/2021  ? CHOL 132 01/19/2021  ? TRIG 276.0 (H) 01/19/2021  ? HDL 33.90 (L) 01/19/2021  ? LDLDIRECT 79.0 01/19/2021  ? [place] 65 01/01/2019  ? ALT 36 01/19/2021  ? AST 35 01/19/2021  ? NA 139 01/19/2021  ? K 3.6 01/19/2021  ? CL 103 01/19/2021  ? CREATININE 0.87 01/19/2021  ? BUN 15 01/19/2021  ? CO2 30 01/19/2021  ? TSH 2.12 01/19/2021  ? PSA 0.07 (L) 01/19/2021  ? INR 0.99 10/06/2016  ? HGBA1C 5.5 01/19/2021  ? ?Assessment/Plan:  ?David Kim is a 76 y.o. White or Caucasian [1] male with  has a past medical history of Allergic rhinitis, ALLERGIC RHINITIS (10/01/2007), Anxiety, Carpal tunnel syndrome, Depression, Depression, Erectile dysfunction, Fatigue, GERD (gastroesophageal reflux disease), Gout, Hypertension, HYPOGONADISM (03/31/2008), IBS (irritable bowel syndrome), Low back pain, OSA (obstructive sleep apnea), Paraplegia ([place]) (04/23/2009), Peptic ulcer disease, PEPTIC ULCER DISEASE (10/01/2007), Prostate cancer [facility]), PROSTATE CANCER, HX OF (05/23/2007), SPASTIC PARALYSIS (04/23/2009), and Thoracic spinal cord injury ([place]) (03/12/2012). ? ?Vitamin D deficiency ?Last vitamin D ?Lab Results  ?Component Value Date  ? VD25OH 51.95 01/19/2021  ? ?Stable, cont oral replacement ? ? ?PROSTATE CANCER, HX OF ?Also for psa f/u today,  to f/u any worsening symptoms or concerns ? ?Hyperlipidemia ?Lab Results  ?Component Value Date  ? [place] 65 01/01/2019  ? ?Stable, pt to continue current low chol  diet ? ? ?Hyperglycemia ?Lab Results  ?Component Value Date  ? HGBA1C 5.5 01/19/2021  ? ?Stable, pt to continue current medical treatment  - diet ? ? ?Essential hypertension ?BP Readings from Last 3 Encounters:  ?01/31/22 114/68  ?09/12/

## 2022-01-31 NOTE — Assessment & Plan Note (Signed)
Also for psa f/u today,  to f/u any worsening symptoms or concerns ?

## 2022-02-01 ENCOUNTER — Encounter: Payer: Self-pay | Admitting: Internal Medicine

## 2022-02-01 LAB — URINALYSIS, ROUTINE W REFLEX MICROSCOPIC
Bilirubin Urine: NEGATIVE
Hgb urine dipstick: NEGATIVE
Ketones, ur: NEGATIVE
Leukocytes,Ua: NEGATIVE
Nitrite: NEGATIVE
RBC / HPF: NONE SEEN (ref 0–?)
Specific Gravity, Urine: 1.02 (ref 1.000–1.030)
Total Protein, Urine: NEGATIVE
Urine Glucose: NEGATIVE
Urobilinogen, UA: 0.2 (ref 0.0–1.0)
WBC, UA: NONE SEEN (ref 0–?)
pH: 6.5 (ref 5.0–8.0)

## 2022-02-01 LAB — HEMOGLOBIN A1C: Hgb A1c MFr Bld: 5.8 % (ref 4.6–6.5)

## 2022-02-16 ENCOUNTER — Other Ambulatory Visit: Payer: Self-pay | Admitting: Physical Medicine & Rehabilitation

## 2022-03-18 NOTE — Progress Notes (Signed)
? ?Subjective:  ? ? Patient ID: David Kim, male    DOB: 07/15/1946, 76 y.o.   MRN: [id number] ? ?HPI   ?male followed for OSA/Insomnia, complicated by paraplegia after excision spinal cord tumor, GERD, HBP, GERD ?NPSG 2003, AHI 31 / hr ?----------------------------------------------------------------------------------------- ? ?03/14/21- 76 year old male never smoker followed for OSA /Insomnia, complicated by Paraplegia/ Spastic L hemiparesis  after spinal tumor resected, Seizure, GERD, HBP,  ?-Clonazepam 2 mg x 2(4 mg) hs,  ?CPAP  auto 5-15/ [employer] ?Download-compliance 100%, AHI 1.3/ hr ?Body weight today-190 lbs ?Covid vax-2 PhIZER ?------Patient states that he is sleeping ok, machine is working good.  ?Clonazepam helps sleep. No concerns about CPAP. ?Rehab is making new chair for him. ? ?03/20/22- 76 year old male never smoker followed for OSA /Insomnia, complicated by Paraplegia/ Spastic L hemiparesis  after spinal tumor resected, Seizure, GERD, HBP,  ?-Clonazepam 2 mg x 2(4 mg) hs,  ?CPAP  auto 5-15/ [employer] ?Download-compliance 100%, AHI 2/ hr ?Body weight today- ?Covid vax-2 Phizer ?Flu vax- had ?-----Patient is doing good, no concerns ?No specific problems.  He sometimes wakes to take medication during the night.  Clonazepam is provided by his physiatrist.  He thinks he sometimes does not get enough sleep but admits he can adjust when he goes to bed, when he gets up and whether or not to nap. ?Finally got replacement wheelchair which works better for him. ? ?Review of Systems   + = positive ?Constitutional: Negative for fever and unexpected weight change.  ?HENT: Negative for congestion, dental problem, ear pain, nosebleeds, postnasal drip, rhinorrhea, sinus pressure, sneezing, sore throat and trouble swallowing.   ?Eyes: Negative for redness and itching.  ?Respiratory: Negative for cough, chest tightness, shortness of breath and wheezing.   ?Cardiovascular: Negative for palpitations, + leg swelling.   ?Gastrointestinal: Negative for nausea and vomiting.  ?Genitourinary: Negative for dysuria.  ?Musculoskeletal: Negative for joint swelling.  ?Skin: Negative for rash.  ?Neurological: +per HPI ?Hematological: Does not bruise/bleed easily.  ?Psychiatric/Behavioral: Negative for dysphoric mood. The patient is not nervous/anxious.   ? ?Objective:  ?OBJ- Physical Exam ?General- Alert, Oriented, Affect-appropriate, Distress- none acute, + Wheelchair, + obese ?Skin- rash-none, lesions- none, excoriation- none ?Lymphadenopathy- none ?Head- atraumatic ?           Eyes- Gross vision intact, PERRLA, conjunctivae and secretions clear ?           Ears- Hearing, canals-normal ?           Nose- Clear, no-Septal dev, mucus, polyps, erosion, perforation  ?           Throat- Mallampati II , mucosa clear , drainage- none, tonsils- atrophic, + missing teeth  ?                 neck- flexible , trachea midline, no stridor , thyroid nl, carotid no bruit ?Chest - symmetrical excursion , unlabored ?          Heart/CV- RRR , no murmur , no gallop  , no rub, nl s1 s2 ?                          - JVD- none , edema-+trace, R>L, stasis changes- none, varices- none ?          Lung- clear to P&A, wheeze- none, cough- none , dullness-none, rub- none ?          Chest wall-  ?Abd-  ?Br/ Gen/ Rectal- Not  done, not indicated ?Extrem- cyanosis- none, clubbing, none, atrophy- none, strength- nl ?Neuro- +paraplegic L ? ? ?Baird Lyons, MD ? ? ? ? ? ?

## 2022-03-20 ENCOUNTER — Ambulatory Visit (INDEPENDENT_AMBULATORY_CARE_PROVIDER_SITE_OTHER): Payer: Medicare Other | Admitting: Internal Medicine

## 2022-03-20 ENCOUNTER — Encounter: Payer: Self-pay | Admitting: Internal Medicine

## 2022-03-20 DIAGNOSIS — S24109S Unspecified injury at unspecified level of thoracic spinal cord, sequela: Secondary | ICD-10-CM

## 2022-03-20 DIAGNOSIS — G4733 Obstructive sleep apnea (adult) (pediatric): Secondary | ICD-10-CM | POA: Diagnosis not present

## 2022-03-20 NOTE — Assessment & Plan Note (Signed)
Continues wheelchair confined, followed by Physical Medicine. ?

## 2022-03-20 NOTE — Patient Instructions (Signed)
We can continue CPAP auto 5-15 ° °Please call if we can help °

## 2022-03-20 NOTE — Assessment & Plan Note (Signed)
Benefits from CPAP with good compliance and control Plan- continue auto 5-15 

## 2022-04-05 ENCOUNTER — Encounter: Payer: Self-pay | Admitting: Physical Medicine & Rehabilitation

## 2022-04-05 ENCOUNTER — Encounter: Payer: Medicare Other | Attending: Physical Medicine & Rehabilitation | Admitting: Physical Medicine & Rehabilitation

## 2022-04-05 VITALS — BP 104/68 | HR 77

## 2022-04-05 DIAGNOSIS — G8220 Paraplegia, unspecified: Secondary | ICD-10-CM | POA: Diagnosis not present

## 2022-04-05 DIAGNOSIS — S24109S Unspecified injury at unspecified level of thoracic spinal cord, sequela: Secondary | ICD-10-CM | POA: Diagnosis not present

## 2022-04-05 NOTE — Progress Notes (Signed)
Subjective:    Patient ID: David Kim, male    DOB: Jun 19, 1946, 76 y.o.   MRN: [id number]  HPI  David Kim is here in follow up of his thoracic spinal cord injury.  He finally received his new wheelchair which seems to be working for him.  He does not like the foot rest as it is a bit larger than what he wanted but it is working for him.  He also overtightening the armrest and it will not rotate forward.  From a spasm standpoint he is stable.  He remains on his Klonopin as well as his Zanaflex.  He has spasms mostly when he moves and changes positions.  He has worsening cataracts and his wife has been trying to schedule him for eye surgery.  Apparently they are running into some barriers with his paraparesis and spasticity and the practices are not wanting to perform the surgery given those deficits.   Pain Inventory Average Pain 0 Pain Right Now 0 My pain is  no pain  In the last 24 hours, has pain interfered with the following? General activity 0 Relation with others 0 Enjoyment of life 0 What TIME of day is your pain at its worst? varies Sleep (in general) Fair  Pain is worse with:  no pain Pain improves with:  no pain Relief from Meds:  no pain  ability to climb steps?  no do you drive?  no use a wheelchair transfers alone  disabled: date disabled 12/20224 I need assistance with the following:  bathing, toileting, meal prep, household duties, and shopping  bladder control problems weakness numbness trouble walking spasms  Any changes since last visit?  no  Any changes since last visit?  no    Family History  Problem Relation Age of Onset   High blood pressure Mother    Dementia Mother    Diabetes Father    Social History   Socioeconomic History   Marital status: Married    Spouse name: David Kim   Number of children: 0   Years of education: 12   Highest education level: Not on file  Occupational History   Occupation: retired    Fish farm manager: RETIRED   Tobacco Use   Smoking status: Never   Smokeless tobacco: Never  Vaping Use   Vaping Use: Never used  Substance and Sexual Activity   Alcohol use: No   Drug use: No   Sexual activity: Not on file  Other Topics Concern   Not on file  Social History Narrative   Patient lives at home with his wife David Kim). Patient is disabled. Patient has 12 th grade education.   Right handed.   Caffeine- None   Social Determinants of Health   Financial Resource Strain: Low Risk    Difficulty of Paying Living Expenses: Not hard at all  Food Insecurity: No Food Insecurity   Worried About Charity fundraiser in the Last Year: Never true   Ran Out of Food in the Last Year: Never true  Transportation Needs: No Transportation Needs   Lack of Transportation (Medical): No   Lack of Transportation (Non-Medical): No  Physical Activity: Inactive   Days of Exercise per Week: 0 days   Minutes of Exercise per Session: 0 min  Stress: No Stress Concern Present   Feeling of Stress : Not at all  Social Connections: Moderately Isolated   Frequency of Communication with Friends and Family: More than three times a week   Frequency of Social  Gatherings with Friends and Family: More than three times a week   Attends Religious Services: Never   Marine scientist or Organizations: No   Attends Music therapist: Never   Marital Status: Married   Past Surgical History:  Procedure Laterality Date   ARTERY AND TENDON REPAIR Left 09/06/2018   Procedure: ARTERY AND TENDON REPAIR;  Surgeon: David Crumb, MD;  Location: [place];  Service: Orthopedics;  Laterality: Left;   CAST APPLICATION Left 39/0/3009   Procedure: CAST APPLICATION;  Surgeon: David Crumb, MD;  Location: [place];  Service: Orthopedics;  Laterality: Left;   inguinal heniorrhaphy     left leg surgery after fibula     NERVE REPAIR Left 09/06/2018   Procedure: NERVE REPAIR TIMES TWO;  Surgeon: David Crumb, MD;  Location: [place];  Service: Orthopedics;  Laterality: Left;   OPEN REDUCTION INTERNAL FIXATION (ORIF) DISTAL PHALANX Left 09/06/2018   Procedure: OPEN REDUCTION INTERNAL FIXATION (ORIF) LEFT LONG FINGER;  Surgeon: David Crumb, MD;  Location: [place];  Service: Orthopedics;  Laterality: Left;   PILONIDAL CYST / SINUS EXCISION     PROSTATECTOMY     tonsillectomy     WOUND EXPLORATION Left 09/06/2018   Procedure: EXPLORATION OFCOMPLEX INJURY;  Surgeon: David Crumb, MD;  Location: [place];  Service: Orthopedics;  Laterality: Left;   Past Medical History:  Diagnosis Date   Allergic rhinitis    ALLERGIC RHINITIS 10/01/2007   Qualifier: Diagnosis of  By: David Reichmann MD, David Kim    Anxiety    Carpal tunnel syndrome    right   Depression    Depression    Erectile dysfunction    Fatigue    GERD (gastroesophageal reflux disease)    Gout    Hypertension    HYPOGONADISM 03/31/2008   Qualifier: Diagnosis of  By: David Reichmann MD, David Kim    IBS (irritable bowel syndrome)    Low back pain    OSA (obstructive sleep apnea)    Paraplegia ([place]) 04/23/2009   Qualifier: Diagnosis of  By: David Reichmann MD, David Kim    Peptic ulcer disease    PEPTIC ULCER DISEASE 10/01/2007   Qualifier: Diagnosis of  By: David Reichmann MD, David Kim    Prostate cancer [facility])    PROSTATE CANCER, HX OF 05/23/2007   Qualifier: Diagnosis of  By: David Reichmann MD, [place] PARALYSIS 04/23/2009   Qualifier: Diagnosis of  By: David Reichmann MD, David Kim    Thoracic spinal cord injury ([place]) 03/12/2012   BP 104/68   Pulse 77   SpO2 94%   Opioid Risk Score:   Fall Risk Score:  `1  Depression screen [facility] 2/9     04/05/2022    1:03 PM 01/31/2022    2:51 PM 01/31/2022    2:18 PM 07/18/2021    4:19 PM 01/19/2021    3:28 PM 01/19/2021    2:43 PM 01/09/2020    4:04 PM  Depression screen PHQ 2/9  Decreased Interest 0 0 0 0 0 0 0  Down, Depressed, Hopeless 0 1 0 0 '1 1 1  '$ PHQ - 2 Score 0 1 0 0 '1 1 1      '$ Review of Systems     Objective:   Physical Exam General: No acute  distress HEENT: NCAT, EOMI, oral membranes moist Cards: reg rate  Chest: normal effort Abdomen: Soft, NT, ND Skin: dry, intact Extremities: no edema Psych: pleasant and appropriate   Neuro: He has  a few scattered abrasions in the lower extremities neuro:  Tone is 2 out of 4 in both lower extremities.  better knee extension today. Knees are easily ranged with no resting tone.  Musculoskeletal:  Minimal pain with range of motion           Assessment & Plan:  Medical Problem List and Plan: 1.  Spastic paraplegia d/t thoracic myelopathy             -does have wheel chair now which seems to be fitting for him  -needs adjustment to handle 2  spastic hemiplegia: On Tizanidine 4 mg qid, baclofen 20 mg ac/hs             -klonopin is effective             -He should be fine for his procedure.  Anesthesia should control his spasticity.  He could also have some Valium prior to the procedure as well. 3 Neurogenic bowel and bladder:               incontinent bladder emptying    -continue with program   Fifteen minutes of face to face patient care time were spent during this visit. All questions were encouraged and answered.  Follow up with me in one year .

## 2022-04-05 NOTE — Patient Instructions (Signed)
PLEASE FEEL FREE TO CALL OUR OFFICE WITH ANY PROBLEMS OR QUESTIONS ([phone])      

## 2022-04-20 DIAGNOSIS — G4733 Obstructive sleep apnea (adult) (pediatric): Secondary | ICD-10-CM | POA: Diagnosis not present

## 2022-04-20 DIAGNOSIS — Z79899 Other long term (current) drug therapy: Secondary | ICD-10-CM | POA: Diagnosis not present

## 2022-04-20 DIAGNOSIS — Z8546 Personal history of malignant neoplasm of prostate: Secondary | ICD-10-CM | POA: Diagnosis not present

## 2022-04-20 DIAGNOSIS — G8220 Paraplegia, unspecified: Secondary | ICD-10-CM | POA: Diagnosis not present

## 2022-04-20 DIAGNOSIS — I10 Essential (primary) hypertension: Secondary | ICD-10-CM | POA: Diagnosis not present

## 2022-04-20 DIAGNOSIS — H2512 Age-related nuclear cataract, left eye: Secondary | ICD-10-CM | POA: Diagnosis not present

## 2022-04-27 DIAGNOSIS — Z48810 Encounter for surgical aftercare following surgery on the sense organs: Secondary | ICD-10-CM | POA: Diagnosis not present

## 2022-05-29 DIAGNOSIS — H2511 Age-related nuclear cataract, right eye: Secondary | ICD-10-CM | POA: Diagnosis not present

## 2022-05-29 DIAGNOSIS — Z961 Presence of intraocular lens: Secondary | ICD-10-CM | POA: Diagnosis not present

## 2022-05-29 DIAGNOSIS — Z48810 Encounter for surgical aftercare following surgery on the sense organs: Secondary | ICD-10-CM | POA: Diagnosis not present

## 2022-06-05 ENCOUNTER — Other Ambulatory Visit: Payer: Self-pay | Admitting: Physical Medicine & Rehabilitation

## 2022-06-05 DIAGNOSIS — G839 Paralytic syndrome, unspecified: Secondary | ICD-10-CM

## 2022-06-05 DIAGNOSIS — S24109S Unspecified injury at unspecified level of thoracic spinal cord, sequela: Secondary | ICD-10-CM

## 2022-06-05 DIAGNOSIS — G8220 Paraplegia, unspecified: Secondary | ICD-10-CM

## 2022-07-13 ENCOUNTER — Telehealth: Payer: Self-pay | Admitting: Internal Medicine

## 2022-07-13 NOTE — Telephone Encounter (Signed)
Unable to leave a voicemail for form completion. Form placed in front office.

## 2022-07-13 NOTE — Telephone Encounter (Signed)
PT's spouse visits today with a form to be filled out by Dr.John. Form has  been placed in Dr.John's mailbox inside an envelope. PT's spouse would like to be notified once this is finished.  CB: [phone]

## 2022-07-13 NOTE — Telephone Encounter (Signed)
Form received and given to Dr.John 

## 2022-07-24 ENCOUNTER — Ambulatory Visit (INDEPENDENT_AMBULATORY_CARE_PROVIDER_SITE_OTHER): Payer: Medicare Other

## 2022-07-24 VITALS — Ht 68.0 in | Wt 190.0 lb

## 2022-07-24 DIAGNOSIS — Z0000 Encounter for general adult medical examination without abnormal findings: Secondary | ICD-10-CM

## 2022-07-24 NOTE — Progress Notes (Signed)
Subjective:   David Kim is a 76 y.o. male who presents for Medicare Annual/Subsequent preventive examination.   Virtual Visit via Telephone Note  I connected with  David Kim on 07/24/22 at  2:30 PM EDT by telephone and verified that I am speaking with the correct person using two identifiers.  Location: Patient: home  Provider: [facility]  Persons participating in the virtual visit: patient/Nurse Health Advisor   I discussed the limitations, risks, security and privacy concerns of performing an evaluation and management service by telephone and the availability of in person appointments. The patient expressed understanding and agreed to proceed.  Interactive audio and video telecommunications were attempted between this nurse and patient, however failed, due to patient having technical difficulties OR patient did not have access to video capability.  We continued and completed visit with audio only.  Some vital signs may be absent or patient reported.   Daphane Shepherd, LPN  Review of Systems     Cardiac Risk Factors include: advanced age (>64mn, >>65women);male gender;hypertension     Objective:    Today's Vitals   07/24/22 1447  Weight: 190 lb (86.2 kg)  Height: '5\' 8"'$  (1.727 m)   Body mass index is 28.89 kg/m.     07/24/2022    2:54 PM 04/05/2022    1:03 PM 07/18/2021    4:21 PM 09/22/2019    6:04 PM 09/15/2019   10:28 PM 09/06/2018    7:50 PM 10/06/2016   12:59 AM  Advanced Directives  Does Patient Have a Medical Advance Directive? Yes Yes No No No No No  Type of [place] will Living will       Copy of [place] Chart? No - copy requested        Would patient like information on creating a medical advance directive?   No - Patient declined No - Patient declined No - Patient declined No - Patient declined     Current Medications (verified) Outpatient Encounter Medications as of 07/24/2022   Medication Sig   aspirin 81 MG EC tablet Take 81 mg by mouth daily.   baclofen (LIORESAL) 20 MG tablet TAKE 1 TABLET(20 MG) BY MOUTH FOUR TIMES DAILY   calcium citrate-vitamin D 500-500 MG-UNIT chewable tablet Chew 1 tablet by mouth daily.   cholecalciferol (VITAMIN D3) 25 MCG (1000 UT) tablet Take 1,000 Units by mouth daily.   clonazePAM (KLONOPIN) 2 MG tablet TAKE 2 TABLETS(4 MG) BY MOUTH AT BEDTIME   losartan (COZAAR) 50 MG tablet TAKE 1 TABLET(50 MG) BY MOUTH TWICE DAILY   Multiple Vitamin (MULTIVITAMIN) capsule Take 1 capsule by mouth daily.   Multiple Vitamins-Minerals (ZINC PO) Take by mouth.   oxybutynin (DITROPAN) 5 MG tablet Take 5 mg by mouth 4 (four) times daily.   tiZANidine (ZANAFLEX) 4 MG tablet TAKE 1-2 TABLETS BY MOUTH FOUR TIMES DAILY   vitamin C (ASCORBIC ACID) 500 MG tablet Take 500 mg by mouth daily.   No facility-administered encounter medications on file as of 07/24/2022.    Allergies (verified) Amoxicillin and Endal hd   History: Past Medical History:  Diagnosis Date   Allergic rhinitis    ALLERGIC RHINITIS 10/01/2007   Qualifier: Diagnosis of  By: JJenny ReichmannMD, JHunt Oris   Anxiety    Carpal tunnel syndrome    right   Depression    Depression    Erectile dysfunction    Fatigue    GERD (  gastroesophageal reflux disease)    Gout    Hypertension    HYPOGONADISM 03/31/2008   Qualifier: Diagnosis of  By: Jenny Reichmann MD, Hunt Oris    IBS (irritable bowel syndrome)    Low back pain    OSA (obstructive sleep apnea)    Paraplegia ([place]) 04/23/2009   Qualifier: Diagnosis of  By: Jenny Reichmann MD, Hunt Oris    Peptic ulcer disease    PEPTIC ULCER DISEASE 10/01/2007   Qualifier: Diagnosis of  By: Jenny Reichmann MD, Hunt Oris    Prostate cancer [facility])    PROSTATE CANCER, HX OF 05/23/2007   Qualifier: Diagnosis of  By: Jenny Reichmann MD, [place] PARALYSIS 04/23/2009   Qualifier: Diagnosis of  By: Jenny Reichmann MD, Hunt Oris    Thoracic spinal cord injury ([place]) 03/12/2012   Past Surgical History:  Procedure  Laterality Date   ARTERY AND TENDON REPAIR Left 09/06/2018   Procedure: ARTERY AND TENDON REPAIR;  Surgeon: Charlotte Crumb, MD;  Location: [place];  Service: Orthopedics;  Laterality: Left;   CAST APPLICATION Left 42/05/622   Procedure: CAST APPLICATION;  Surgeon: Charlotte Crumb, MD;  Location: [place];  Service: Orthopedics;  Laterality: Left;   inguinal heniorrhaphy     left leg surgery after fibula     NERVE REPAIR Left 09/06/2018   Procedure: NERVE REPAIR TIMES TWO;  Surgeon: Charlotte Crumb, MD;  Location: [place];  Service: Orthopedics;  Laterality: Left;   OPEN REDUCTION INTERNAL FIXATION (ORIF) DISTAL PHALANX Left 09/06/2018   Procedure: OPEN REDUCTION INTERNAL FIXATION (ORIF) LEFT LONG FINGER;  Surgeon: Charlotte Crumb, MD;  Location: [place];  Service: Orthopedics;  Laterality: Left;   PILONIDAL CYST / SINUS EXCISION     PROSTATECTOMY     tonsillectomy     WOUND EXPLORATION Left 09/06/2018   Procedure: EXPLORATION OFCOMPLEX INJURY;  Surgeon: Charlotte Crumb, MD;  Location: [place];  Service: Orthopedics;  Laterality: Left;   Family History  Problem Relation Age of Onset   High blood pressure Mother    Dementia Mother    Diabetes Father    Social History   Socioeconomic History   Marital status: Married    Spouse name: [name]   Number of children: 0   Years of education: 12   Highest education level: Not on file  Occupational History   Occupation: retired    Fish farm manager: RETIRED  Tobacco Use   Smoking status: Never   Smokeless tobacco: Never  Vaping Use   Vaping Use: Never used  Substance and Sexual Activity   Alcohol use: No   Drug use: No   Sexual activity: Not on file  Other Topics Concern   Not on file  Social History Narrative   Patient lives at home with [name]). Patient is disabled. Patient has 12 th grade education.   Right handed.   Caffeine- None   Social Determinants of Health   Financial Resource Strain: Low Risk  (07/24/2022)   Overall  Financial Resource Strain (CARDIA)    Difficulty of Paying Living Expenses: Not hard at all  Food Insecurity: No Food Insecurity (07/24/2022)   Hunger Vital Sign    Worried About Running Out of Food in the Last Year: Never true    Ran Out of Food in the Last Year: Never true  Transportation Needs: No Transportation Needs (07/24/2022)   PRAPARE - Hydrologist (Medical): No    Lack of Transportation (Non-Medical): No  Physical Activity: Inactive (  07/24/2022)   Exercise Vital Sign    Days of Exercise per Week: 0 days    Minutes of Exercise per Session: 0 min  Stress: No Stress Concern Present (07/24/2022)   [place]    Feeling of Stress : Not at all  Social Connections: Moderately Isolated (07/24/2022)   Social Connection and Isolation Panel [NHANES]    Frequency of Communication with Friends and Family: More than three times a week    Frequency of Social Gatherings with Friends and Family: More than three times a week    Attends Religious Services: Never    Marine scientist or Organizations: No    Attends Music therapist: Never    Marital Status: Married    Tobacco Counseling Counseling given: Not Answered   Clinical Intake:  Pre-visit preparation completed: Yes  Pain : No/denies pain     Nutritional Risks: None Diabetes: No  How often do you need to have someone help you when you read instructions, pamphlets, or other written materials from your doctor or pharmacy?: 1 - Never  Diabetic?no  Interpreter Needed?: No  Information entered by :: Jadene Pierini, LPN   Activities of Daily Living    07/24/2022    2:54 PM  In your present state of health, do you have any difficulty performing the following activities:  Hearing? 1  Vision? 1  Difficulty concentrating or making decisions? 1  Walking or climbing stairs? 1  Dressing or bathing? 1  Doing errands,  shopping? 1  Preparing Food and eating ? Y  Using the Toilet? Y  In the past six months, have you accidently leaked urine? Y  Do you have problems with loss of bowel control? Y  Managing your Medications? Y  Comment wife care giver  Managing your Finances? Y  Housekeeping or managing your Housekeeping? Y    Patient Care Team: Biagio Borg, MD as PCP - General  Indicate any recent Medical Services you may have received from other than [facility] providers in the past year (date may be approximate).     Assessment:   This is a routine wellness examination for Javarie.  Hearing/Vision screen Vision Screening - Comments:: Annual eye exams wears glasses   Dietary issues and exercise activities discussed: Current Exercise Habits: The patient does not participate in regular exercise at present, Exercise limited by: orthopedic condition(s) (In wheel Chair)   Goals Addressed             This Visit's Progress    DIET - INCREASE WATER INTAKE         Depression Screen    04/05/2022    1:03 PM 01/31/2022    2:51 PM 01/31/2022    2:18 PM 07/18/2021    4:19 PM 01/19/2021    3:28 PM 01/19/2021    2:43 PM 01/09/2020    4:04 PM  PHQ 2/9 Scores  PHQ - 2 Score 0 1 0 0 '1 1 1    '$ Fall Risk    07/24/2022    2:48 PM 04/05/2022    1:03 PM 01/31/2022    2:50 PM 01/31/2022    2:18 PM 07/18/2021    4:23 PM  Fall Risk   Falls in the past year? 0 0 0 0 0  Number falls in past yr: 0  0 0 0  Injury with Fall? 0  0 0 0  Risk for fall due to : No Fall Risks;Impaired mobility  Risk for fall due to: Comment wheel Chair      Follow up Falls prevention discussed        [place]:  Any stairs in or around the home? Yes  If so, are there any without handrails? No  Home free of loose throw rugs in walkways, pet beds, electrical cords, etc? Yes  Adequate lighting in your home to reduce risk of falls? Yes   ASSISTIVE DEVICES UTILIZED TO PREVENT FALLS:  Life alert?  No  Use of a cane, walker or w/c? Yes  Grab bars in the bathroom? Yes  Shower chair or bench in shower? Yes  Elevated toilet seat or a handicapped toilet? Yes          07/24/2022    2:55 PM  6CIT Screen  What Year? 0 points  What month? 0 points  What time? 0 points  Count back from 20 2 points  Months in reverse 0 points  Repeat phrase 2 points  Total Score 4 points    Immunizations Immunization History  Administered Date(s) Administered   Fluad Quad(high Dose 65+) 07/02/2019, 07/22/2020, 07/18/2021   Influenza Split 07/31/2011, 07/30/2012   Influenza Whole 10/01/2007, 07/28/2008, 08/04/2009, 08/03/2010   Influenza, High Dose Seasonal PF 09/27/2016, 10/03/2017   Influenza,inj,Quad PF,6+ Mos 07/09/2013, 07/23/2014, 09/07/2015   Influenza-Unspecified 07/08/2018   PFIZER(Purple Top)SARS-COV-2 Vaccination 11/28/2019, 12/19/2019   Pneumococcal Conjugate-13 04/15/2014   Pneumococcal Polysaccharide-23 10/12/2008, 10/21/2014   Td 07/28/2008   Tdap 09/06/2018   Zoster Recombinat (Shingrix) 03/29/2018, 09/05/2018   Zoster, Live 10/12/2008    TDAP status: Up to date  Flu Vaccine status: Due, Education has been provided regarding the importance of this vaccine. Advised may receive this vaccine at local pharmacy or Health Dept. Aware to provide a copy of the vaccination record if obtained from local pharmacy or Health Dept. Verbalized acceptance and understanding.  Pneumococcal vaccine status: Up to date  Covid-19 vaccine status: Completed vaccines  Qualifies for Shingles Vaccine? Yes   Zostavax completed Yes   Shingrix Completed?: Yes  Screening Tests Health Maintenance  Topic Date Due   INFLUENZA VACCINE  06/06/2022   COLONOSCOPY (Pts 45-85yr Insurance coverage will need to be confirmed)  02/01/2023 (Originally 02/02/1993)   TETANUS/TDAP  09/06/2028   Pneumonia Vaccine 76 Years old  Completed   Hepatitis C Screening  Completed   Zoster Vaccines- Shingrix  Completed    HPV VACCINES  Aged Out   COVID-19 Vaccine  Discontinued    Health Maintenance  Health Maintenance Due  Topic Date Due   INFLUENZA VACCINE  06/06/2022    Colorectal cancer screening: Referral to GI placed patient declines at this time . Pt aware the office will call re: appt.  Lung Cancer Screening: (Low Dose CT Chest recommended if Age 74-80years, 30 pack-year currently smoking OR have quit w/in 15years.) does not qualify.   Lung Cancer Screening Referral: n/a  Additional Screening:  Hepatitis C Screening: does not qualify;   Vision Screening: Recommended annual ophthalmology exams for early detection of glaucoma and other disorders of the eye. Is the patient up to date with their annual eye exam?  Yes  Who is the provider or what is the name of the office in which the patient attends annual eye exams? [facility]  If pt is not established with a provider, would they like to be referred to a provider to establish care? No .   Dental Screening: Recommended annual dental exams for  proper oral hygiene  Community Resource Referral / Chronic Care Management: CRR required this visit?  No   CCM required this visit?  No      Plan:     I have personally reviewed and noted the following in the patient's chart:   Medical and social history Use of alcohol, tobacco or illicit drugs  Current medications and supplements including opioid prescriptions. Patient is not currently taking opioid prescriptions. Functional ability and status Nutritional status Physical activity Advanced directives List of other physicians Hospitalizations, surgeries, and ER visits in previous 12 months Vitals Screenings to include cognitive, depression, and falls Referrals and appointments  In addition, I have reviewed and discussed with patient certain preventive protocols, quality metrics, and best practice recommendations. A written personalized care plan for preventive services as well as general  preventive health recommendations were provided to patient.     Daphane Shepherd, LPN   03/25/8021   Nurse Notes: Patient declines Colonoscopy at this time

## 2022-07-24 NOTE — Patient Instructions (Signed)
David Kim , Thank you for taking time to come for your Medicare Wellness Visit. I appreciate your ongoing commitment to your health goals. Please review the following plan we discussed and let me know if I can assist you in the future.   Screening recommendations/referrals: Colonoscopy: declined by patient  Recommended yearly ophthalmology/optometry visit for glaucoma screening and checkup Recommended yearly dental visit for hygiene and checkup  Vaccinations: Influenza vaccine: due in the fall Pneumococcal vaccine: completed  Tdap vaccine: 09/06/2018 Shingles vaccine: completed    Covid-19: completed   Advanced directives: Please bring a copy of your health care power of attorney and living will to the office to be added to your chart at your convenience.   Conditions/risks identified: Aim for 30 minutes of exercise or brisk walking, 6-8 glasses of water, and 5 servings of fruits and vegetables each day.   Next appointment: Follow up in one year for your annual wellness visit.   Preventive Care 74 Years and Older, Male  Preventive care refers to lifestyle choices and visits with your health care provider that can promote health and wellness. What does preventive care include? A yearly physical exam. This is also called an annual well check. Dental exams once or twice a year. Routine eye exams. Ask your health care provider how often you should have your eyes checked. Personal lifestyle choices, including: Daily care of your teeth and gums. Regular physical activity. Eating a healthy diet. Avoiding tobacco and drug use. Limiting alcohol use. Practicing safe sex. Taking low doses of aspirin every day. Taking vitamin and mineral supplements as recommended by your health care provider. What happens during an annual well check? The services and screenings done by your health care provider during your annual well check will depend on your age, overall health, lifestyle risk factors,  and family history of disease. Counseling  Your health care provider may ask you questions about your: Alcohol use. Tobacco use. Drug use. Emotional well-being. Home and relationship well-being. Sexual activity. Eating habits. History of falls. Memory and ability to understand (cognition). Work and work Statistician. Screening  You may have the following tests or measurements: Height, weight, and BMI. Blood pressure. Lipid and cholesterol levels. These may be checked every 5 years, or more frequently if you are over 62 years old. Skin check. Lung cancer screening. You may have this screening every year starting at age 741 if you have a 30-pack-year history of smoking and currently smoke or have quit within the past 15 years. Fecal occult blood test (FOBT) of the stool. You may have this test every year starting at age 40. Flexible sigmoidoscopy or colonoscopy. You may have a sigmoidoscopy every 5 years or a colonoscopy every 10 years starting at age 74. Prostate cancer screening. Recommendations will vary depending on your family history and other risks. Hepatitis C blood test. Hepatitis B blood test. Sexually transmitted disease (STD) testing. Diabetes screening. This is done by checking your blood sugar (glucose) after you have not eaten for a while (fasting). You may have this done every 1-3 years. Abdominal aortic aneurysm (AAA) screening. You may need this if you are a current or former smoker. Osteoporosis. You may be screened starting at age 74 if you are at high risk. Talk with your health care provider about your test results, treatment options, and if necessary, the need for more tests. Vaccines  Your health care provider may recommend certain vaccines, such as: Influenza vaccine. This is recommended every year. Tetanus, diphtheria, and  acellular pertussis (Tdap, Td) vaccine. You may need a Td booster every 10 years. Zoster vaccine. You may need this after age  74. Pneumococcal 13-valent conjugate (PCV13) vaccine. One dose is recommended after age 44. Pneumococcal polysaccharide (PPSV23) vaccine. One dose is recommended after age 68. Talk to your health care provider about which screenings and vaccines you need and how often you need them. This information is not intended to replace advice given to you by your health care provider. Make sure you discuss any questions you have with your health care provider. Document Released: 11/19/2015 Document Revised: 07/12/2016 Document Reviewed: 08/24/2015 Elsevier Interactive Patient Education  2017 [place] Prevention in the Home Falls can cause injuries. They can happen to people of all ages. There are many things you can do to make your home safe and to help prevent falls. What can I do on the outside of my home? Regularly fix the edges of walkways and driveways and fix any cracks. Remove anything that might make you trip as you walk through a door, such as a raised step or threshold. Trim any bushes or trees on the path to your home. Use bright outdoor lighting. Clear any walking paths of anything that might make someone trip, such as rocks or tools. Regularly check to see if handrails are loose or broken. Make sure that both sides of any steps have handrails. Any raised decks and porches should have guardrails on the edges. Have any leaves, snow, or ice cleared regularly. Use sand or salt on walking paths during winter. Clean up any spills in your garage right away. This includes oil or grease spills. What can I do in the bathroom? Use night lights. Install grab bars by the toilet and in the tub and shower. Do not use towel bars as grab bars. Use non-skid mats or decals in the tub or shower. If you need to sit down in the shower, use a plastic, non-slip stool. Keep the floor dry. Clean up any water that spills on the floor as soon as it happens. Remove soap buildup in the tub or shower  regularly. Attach bath mats securely with double-sided non-slip rug tape. Do not have throw rugs and other things on the floor that can make you trip. What can I do in the bedroom? Use night lights. Make sure that you have a light by your bed that is easy to reach. Do not use any sheets or blankets that are too big for your bed. They should not hang down onto the floor. Have a firm chair that has side arms. You can use this for support while you get dressed. Do not have throw rugs and other things on the floor that can make you trip. What can I do in the kitchen? Clean up any spills right away. Avoid walking on wet floors. Keep items that you use a lot in easy-to-reach places. If you need to reach something above you, use a strong step stool that has a grab bar. Keep electrical cords out of the way. Do not use floor polish or wax that makes floors slippery. If you must use wax, use non-skid floor wax. Do not have throw rugs and other things on the floor that can make you trip. What can I do with my stairs? Do not leave any items on the stairs. Make sure that there are handrails on both sides of the stairs and use them. Fix handrails that are broken or loose. Make sure that  handrails are as long as the stairways. Check any carpeting to make sure that it is firmly attached to the stairs. Fix any carpet that is loose or worn. Avoid having throw rugs at the top or bottom of the stairs. If you do have throw rugs, attach them to the floor with carpet tape. Make sure that you have a light switch at the top of the stairs and the bottom of the stairs. If you do not have them, ask someone to add them for you. What else can I do to help prevent falls? Wear shoes that: Do not have high heels. Have rubber bottoms. Are comfortable and fit you well. Are closed at the toe. Do not wear sandals. If you use a stepladder: Make sure that it is fully opened. Do not climb a closed stepladder. Make sure that  both sides of the stepladder are locked into place. Ask someone to hold it for you, if possible. Clearly mark and make sure that you can see: Any grab bars or handrails. First and last steps. Where the edge of each step is. Use tools that help you move around (mobility aids) if they are needed. These include: Canes. Walkers. Scooters. Crutches. Turn on the lights when you go into a dark area. Replace any light bulbs as soon as they burn out. Set up your furniture so you have a clear path. Avoid moving your furniture around. If any of your floors are uneven, fix them. If there are any pets around you, be aware of where they are. Review your medicines with your doctor. Some medicines can make you feel dizzy. This can increase your chance of falling. Ask your doctor what other things that you can do to help prevent falls. This information is not intended to replace advice given to you by your health care provider. Make sure you discuss any questions you have with your health care provider. Document Released: 08/19/2009 Document Revised: 03/30/2016 Document Reviewed: 11/27/2014 Elsevier Interactive Patient Education  2017 [employer].

## 2022-08-02 NOTE — Telephone Encounter (Signed)
David Kim came to pick up form on this day, but it was nowhere to be found. Mrs.Polich stated that she spoke to someone from our office on Monday but there was no documentation of the call. Form renewed and signed by Dr.John.

## 2022-08-21 ENCOUNTER — Telehealth: Payer: Self-pay

## 2022-08-21 DIAGNOSIS — G839 Paralytic syndrome, unspecified: Secondary | ICD-10-CM

## 2022-08-21 DIAGNOSIS — S24109S Unspecified injury at unspecified level of thoracic spinal cord, sequela: Secondary | ICD-10-CM

## 2022-08-21 DIAGNOSIS — G8220 Paraplegia, unspecified: Secondary | ICD-10-CM

## 2022-08-21 MED ORDER — CLONAZEPAM 2 MG PO TABS: 4.0000 mg | ORAL_TABLET | Freq: Every day | ORAL | 0 refills | Status: DC

## 2022-08-21 NOTE — Telephone Encounter (Signed)
David Kim has lost his Clonazepam 2 MG. He would like you to send in 15-16 tabs to bridge him until the next refill. Please send to [facility] on [address] in [place].   Filled  Written  ID  Drug  QTY  Days  Prescriber  RX #  Dispenser  Refill  Daily Dose*  Pymt Type  PMP  06/06/2022 06/06/2022 1  Clonazepam 2 Mg Tablet 180.00 90 Za Swa [id number] [facility] (0531) 0/1 8.00 LME Comm Ins [name] 03/08/2022 12/06/2021 1  Clonazepam 2 Mg Tablet 180.00 90 Za Swa [id number] [facility] (0531) 1/1 8.00 LME Comm Ins [name]

## 2022-08-21 NOTE — Telephone Encounter (Signed)
I will send in 16, not the full rx.

## 2022-09-04 ENCOUNTER — Other Ambulatory Visit: Payer: Self-pay | Admitting: Physical Medicine & Rehabilitation

## 2022-09-08 ENCOUNTER — Ambulatory Visit (INDEPENDENT_AMBULATORY_CARE_PROVIDER_SITE_OTHER): Payer: Medicare Other | Admitting: Internal Medicine

## 2022-09-08 VITALS — BP 138/64 | HR 95 | Temp 98.3°F

## 2022-09-08 DIAGNOSIS — R739 Hyperglycemia, unspecified: Secondary | ICD-10-CM

## 2022-09-08 DIAGNOSIS — Z125 Encounter for screening for malignant neoplasm of prostate: Secondary | ICD-10-CM

## 2022-09-08 DIAGNOSIS — F4321 Adjustment disorder with depressed mood: Secondary | ICD-10-CM | POA: Diagnosis not present

## 2022-09-08 DIAGNOSIS — G4733 Obstructive sleep apnea (adult) (pediatric): Secondary | ICD-10-CM

## 2022-09-08 DIAGNOSIS — G8220 Paraplegia, unspecified: Secondary | ICD-10-CM | POA: Diagnosis not present

## 2022-09-08 DIAGNOSIS — F32A Depression, unspecified: Secondary | ICD-10-CM

## 2022-09-08 DIAGNOSIS — R269 Unspecified abnormalities of gait and mobility: Secondary | ICD-10-CM

## 2022-09-08 DIAGNOSIS — Z8546 Personal history of malignant neoplasm of prostate: Secondary | ICD-10-CM

## 2022-09-08 DIAGNOSIS — R350 Frequency of micturition: Secondary | ICD-10-CM | POA: Diagnosis not present

## 2022-09-08 DIAGNOSIS — E7800 Pure hypercholesterolemia, unspecified: Secondary | ICD-10-CM | POA: Diagnosis not present

## 2022-09-08 DIAGNOSIS — E559 Vitamin D deficiency, unspecified: Secondary | ICD-10-CM | POA: Diagnosis not present

## 2022-09-08 LAB — BASIC METABOLIC PANEL
BUN: 17 mg/dL (ref 6–23)
CO2: 30 mEq/L (ref 19–32)
Calcium: 9.6 mg/dL (ref 8.4–10.5)
Chloride: 104 mEq/L (ref 96–112)
Creatinine, Ser: 0.72 mg/dL (ref 0.40–1.50)
GFR: 89.95 mL/min (ref >60.00)
Glucose, Bld: 99 mg/dL (ref 70–99)
Potassium: 3.8 mEq/L (ref 3.5–5.1)
Sodium: 140 mEq/L (ref 135–145)

## 2022-09-08 LAB — TSH: TSH: 1.3 u[IU]/mL (ref 0.35–5.50)

## 2022-09-08 LAB — CBC WITH DIFFERENTIAL/PLATELET
Basophils Absolute: 0.1 10*3/uL (ref 0.0–0.1)
Basophils Relative: 0.5 % (ref 0.0–3.0)
Eosinophils Absolute: 0.5 10*3/uL (ref 0.0–0.7)
Eosinophils Relative: 5.4 % — ABNORMAL HIGH (ref 0.0–5.0)
HCT: 47.7 % (ref 39.0–52.0)
Hemoglobin: 15.8 g/dL (ref 13.0–17.0)
Lymphocytes Relative: 15.3 % (ref 12.0–46.0)
Lymphs Abs: 1.5 10*3/uL (ref 0.7–4.0)
MCHC: 33.1 g/dL (ref 30.0–36.0)
MCV: 89.8 fl (ref 78.0–100.0)
Monocytes Absolute: 0.8 10*3/uL (ref 0.1–1.0)
Monocytes Relative: 8 % (ref 3.0–12.0)
Neutro Abs: 6.9 10*3/uL (ref 1.4–7.7)
Neutrophils Relative %: 70.8 % (ref 43.0–77.0)
Platelets: 250 10*3/uL (ref 150.0–400.0)
RBC: 5.31 Mil/uL (ref 4.22–5.81)
RDW: 13.9 % (ref 11.5–15.5)
WBC: 9.8 10*3/uL (ref 4.0–10.5)

## 2022-09-08 LAB — HEPATIC FUNCTION PANEL
ALT: 28 U/L (ref 0–53)
AST: 24 U/L (ref 0–37)
Albumin: 4.4 g/dL (ref 3.5–5.2)
Alkaline Phosphatase: 68 U/L (ref 39–117)
Bilirubin, Direct: 0.1 mg/dL (ref 0.0–0.3)
Total Bilirubin: 0.5 mg/dL (ref 0.2–1.2)
Total Protein: 6.9 g/dL (ref 6.0–8.3)

## 2022-09-08 LAB — HEMOGLOBIN A1C: Hgb A1c MFr Bld: 5.9 % (ref 4.6–6.5)

## 2022-09-08 LAB — LIPID PANEL
Cholesterol: 102 mg/dL (ref 0–200)
HDL: 36.7 mg/dL — ABNORMAL LOW (ref >39.00)
LDL Cholesterol: 39 mg/dL (ref 0–99)
NonHDL: 64.95
Total CHOL/HDL Ratio: 3
Triglycerides: 132 mg/dL (ref 0.0–149.0)
VLDL: 26.4 mg/dL (ref 0.0–40.0)

## 2022-09-08 LAB — VITAMIN B12: Vitamin B-12: 380 pg/mL (ref 211–911)

## 2022-09-08 MED ORDER — CITALOPRAM HYDROBROMIDE 10 MG PO TABS: 10.0000 mg | ORAL_TABLET | Freq: Every day | ORAL | 3 refills | Status: DC

## 2022-09-08 NOTE — Patient Instructions (Signed)
You will be contacted regarding the referral for: Home Health (hopefully in 2-3 days) - for RN, PT, OT, social worker, and Aide  Please take all new medication as prescribed - the celexa 10 mg per day  Please continue all other medications as before, and refills have been done if requested.  Please have the pharmacy call with any other refills you may need.  Please keep your appointments with your specialists as you may have planned  Please consider calling Hospice in your [place] to see about Grief Counseling, or the [place] as you mentioned  Please go to the LAB at the blood drawing area for the tests to be done  You will be contacted by phone if any changes need to be made immediately.  Otherwise, you will receive a letter about your results with an explanation, but please check with MyChart first.  Please remember to sign up for MyChart if you have not done so, as this will be important to you in the future with finding out test results, communicating by private email, and scheduling acute appointments online when needed.

## 2022-09-08 NOTE — Progress Notes (Unsigned)
Patient ID: David Kim, male   DOB: [birth year], 75 y.o.   MRN: [id number]        Chief Complaint: follow up with son in law regarding home help - depression, grief, paraplegia, urinary frequency, hld, hypergylcemia, low vit d       HPI:  David Kim is a 76 y.o. male here after wife suddenly passed approx 1 wk ago; daughter and son in law quite involved and helpful (daughter on cell phone with [place] today); Pt grieving and has had mild to mod worsening depressive symptoms, but no suicidal ideation, or panic; has ongoing anxiety.  Denies urinary symptoms such as dysuria, urgency, flank pain, hematuria or n/v, fever, chills. But has had mild increased urinary frequency per famly.  Pt wheelchair bound, and now needs HH with help at home that was up to now handeled by wife. Specifically needs assist with bowel prep, housekeeping, food prep, bathing, laundry, dressing, toileting, groceries.  Pt can wrist bills, feed himself, sponge baths, dress using arms, handle own medications, can also get his own water.   Pt denies chest pain, increased sob or doe, wheezing, orthopnea, PND, increased LE swelling, palpitations, dizziness or syncope.   Pt denies polydipsia, polyuria,        Wt Readings from Last 3 Encounters:  07/24/22 190 lb (86.2 kg)  07/18/21 190 lb (86.2 kg)  03/14/21 190 lb (86.2 kg)   BP Readings from Last 3 Encounters:  09/08/22 138/64  04/05/22 104/68  03/20/22 118/60         Past Medical History:  Diagnosis Date   Allergic rhinitis    ALLERGIC RHINITIS 10/01/2007   Qualifier: Diagnosis of  By: Jenny Reichmann MD, Hunt Oris    Anxiety    Carpal tunnel syndrome    right   Depression    Depression    Erectile dysfunction    Fatigue    GERD (gastroesophageal reflux disease)    Gout    Hypertension    HYPOGONADISM 03/31/2008   Qualifier: Diagnosis of  By: Jenny Reichmann MD, Hunt Oris    IBS (irritable bowel syndrome)    Low back pain    OSA (obstructive sleep apnea)    Paraplegia ([place]) 04/23/2009    Qualifier: Diagnosis of  By: Jenny Reichmann MD, Hunt Oris    Peptic ulcer disease    PEPTIC ULCER DISEASE 10/01/2007   Qualifier: Diagnosis of  By: Jenny Reichmann MD, Hunt Oris    Prostate cancer [facility])    PROSTATE CANCER, HX OF 05/23/2007   Qualifier: Diagnosis of  By: Jenny Reichmann MD, [place] PARALYSIS 04/23/2009   Qualifier: Diagnosis of  By: Jenny Reichmann MD, Hunt Oris    Thoracic spinal cord injury ([place]) 03/12/2012   Past Surgical History:  Procedure Laterality Date   ARTERY AND TENDON REPAIR Left 09/06/2018   Procedure: ARTERY AND TENDON REPAIR;  Surgeon: Charlotte Crumb, MD;  Location: [place];  Service: Orthopedics;  Laterality: Left;   CAST APPLICATION Left 68/11/1570   Procedure: CAST APPLICATION;  Surgeon: Charlotte Crumb, MD;  Location: [place];  Service: Orthopedics;  Laterality: Left;   inguinal heniorrhaphy     left leg surgery after fibula     NERVE REPAIR Left 09/06/2018   Procedure: NERVE REPAIR TIMES TWO;  Surgeon: Charlotte Crumb, MD;  Location: [place];  Service: Orthopedics;  Laterality: Left;   OPEN REDUCTION INTERNAL FIXATION (ORIF) DISTAL PHALANX Left 09/06/2018   Procedure: OPEN REDUCTION INTERNAL FIXATION (ORIF) LEFT LONG FINGER;  Surgeon:  Charlotte Crumb, MD;  Location: [place];  Service: Orthopedics;  Laterality: Left;   PILONIDAL CYST / SINUS EXCISION     PROSTATECTOMY     tonsillectomy     WOUND EXPLORATION Left 09/06/2018   Procedure: EXPLORATION OFCOMPLEX INJURY;  Surgeon: Charlotte Crumb, MD;  Location: [place];  Service: Orthopedics;  Laterality: Left;    reports that he has never smoked. He has never used smokeless tobacco. He reports that he does not drink alcohol and does not use drugs. family history includes Dementia in his mother; Diabetes in his father; High blood pressure in his mother. Allergies  Allergen Reactions   Amoxicillin Other (See Comments)   Endal Hd Other (See Comments)   Current Outpatient Medications on File Prior to Visit  Medication Sig Dispense Refill   aspirin  81 MG EC tablet Take 81 mg by mouth daily.     baclofen (LIORESAL) 20 MG tablet TAKE 1 TABLET(20 MG) BY MOUTH FOUR TIMES DAILY 360 tablet 4   calcium citrate-vitamin D 500-500 MG-UNIT chewable tablet Chew 1 tablet by mouth daily.     cholecalciferol (VITAMIN D3) 25 MCG (1000 UT) tablet Take 1,000 Units by mouth daily.     clonazePAM (KLONOPIN) 2 MG tablet TAKE 2 TABLETS(4 MG) BY MOUTH AT BEDTIME 180 tablet 1   clonazePAM (KLONOPIN) 2 MG tablet Take 2 tablets (4 mg total) by mouth at bedtime. 16 tablet 0   losartan (COZAAR) 50 MG tablet TAKE 1 TABLET(50 MG) BY MOUTH TWICE DAILY 180 tablet 3   Multiple Vitamin (MULTIVITAMIN) capsule Take 1 capsule by mouth daily.     Multiple Vitamins-Minerals (ZINC PO) Take by mouth.     oxybutynin (DITROPAN) 5 MG tablet Take 5 mg by mouth 4 (four) times daily.     tiZANidine (ZANAFLEX) 4 MG tablet TAKE 1-2 TABLETS BY MOUTH FOUR TIMES DAILY 720 tablet 2   vitamin C (ASCORBIC ACID) 500 MG tablet Take 500 mg by mouth daily.     No current facility-administered medications on file prior to visit.        ROS:  All others reviewed and negative.  Objective        PE:  BP 138/64   Pulse 95   Temp 98.3 F (36.8 C)   SpO2 98%                 Constitutional: Pt appears in NAD               HENT: Head: NCAT.                Right Ear: External ear normal.                 Left Ear: External ear normal.                Eyes: . Pupils are equal, round, and reactive to light. Conjunctivae and EOM are normal               Nose: without d/c or deformity               Neck: Neck supple. Gross normal ROM               Cardiovascular: Normal rate and regular rhythm.                 Pulmonary/Chest: Effort normal and breath sounds without rales or wheezing.  Abd:  Soft, NT, ND, + BS, no organomegaly               Neurological: Pt is alert. At baseline orientation, motor grossly intact               Skin: Skin is warm. No rashes, no other new lesions, LE  edema - none               Psychiatric: Pt behavior is normal without agitation , tearful, ,sad, depressed affect  Micro: none  Cardiac tracings I have personally interpreted today:  none  Pertinent Radiological findings (summarize): none   Lab Results  Component Value Date   WBC 8.4 01/31/2022   HGB 16.2 01/31/2022   HCT 48.7 01/31/2022   PLT 208.0 01/31/2022   GLUCOSE 86 01/31/2022   CHOL 137 01/31/2022   TRIG 185.0 (H) 01/31/2022   HDL 36.70 (L) 01/31/2022   LDLDIRECT 79.0 01/19/2021   LDLCALC 64 01/31/2022   ALT 39 01/31/2022   AST 35 01/31/2022   NA 138 01/31/2022   K 3.4 (L) 01/31/2022   CL 102 01/31/2022   CREATININE 0.90 01/31/2022   BUN 18 01/31/2022   CO2 24 01/31/2022   TSH 1.81 01/31/2022   PSA 0.01 (L) 01/31/2022   INR 0.99 10/06/2016   HGBA1C 5.8 01/31/2022   Assessment/Plan:  DEIVI HUCKINS is a 76 y.o. 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  PROSTATE CANCER, HX OF Also for psa with labs,  to f/u any worsening symptoms or concerns  Paraplegia ([place]) No in need of increased home care after wife passing - for [facility] with RN, PT/OT aide and social services, hoepfullyl within a few days  Vitamin D deficiency Last vitamin D Lab Results  Component Value Date   VD25OH 44.98 01/31/2022   Stable, cont oral replacement   Hyperlipidemia Lab Results  Component Value Date   LDLCALC 64 01/31/2022   Stable, pt to  continue current statin  - low chol diet   Hyperglycemia Lab Results  Component Value Date   HGBA1C 5.8 01/31/2022   Stable, pt to continue current medical treatment  - diet, wt control, excercise   Depression New worsening, for celexa 10 mg qd, declines further referral counseling  Urinary frequency Exam benign, non toxic, but for urine culture  Grief Acute with wife passing x 1 wk, pt is encouraged to f/u with hospice grief counseling or [facility] services as menitoned by daughter  Followup: Return in about 3 months (around 12/09/2022).  Cathlean Cower, MD 09/10/2022 12:13 PM [facility]

## 2022-09-09 LAB — URINALYSIS, ROUTINE W REFLEX MICROSCOPIC
Bilirubin Urine: NEGATIVE
Glucose, UA: NEGATIVE
Hgb urine dipstick: NEGATIVE
Ketones, ur: NEGATIVE
Leukocytes,Ua: NEGATIVE
Nitrite: NEGATIVE
Protein, ur: NEGATIVE
Specific Gravity, Urine: 1.02 (ref 1.001–1.035)
pH: 6 (ref 5.0–8.0)

## 2022-09-10 ENCOUNTER — Encounter: Payer: Self-pay | Admitting: Internal Medicine

## 2022-09-10 DIAGNOSIS — R350 Frequency of micturition: Secondary | ICD-10-CM | POA: Insufficient documentation

## 2022-09-10 DIAGNOSIS — F4321 Adjustment disorder with depressed mood: Secondary | ICD-10-CM | POA: Insufficient documentation

## 2022-09-10 LAB — URINE CULTURE

## 2022-09-10 NOTE — Assessment & Plan Note (Addendum)
No in need of increased home care after wife passing - for [facility] with RN, PT/OT aide and social services, hoepfullyl within a few days

## 2022-09-10 NOTE — Assessment & Plan Note (Signed)
Acute with wife passing x 1 wk, pt is encouraged to f/u with hospice grief counseling or [facility] services as menitoned by daughter

## 2022-09-10 NOTE — Assessment & Plan Note (Signed)
Last vitamin D Lab Results  Component Value Date   VD25OH 44.98 01/31/2022   Stable, cont oral replacement

## 2022-09-10 NOTE — Assessment & Plan Note (Addendum)
New worsening, for celexa 10 mg qd, declines further referral counseling

## 2022-09-10 NOTE — Assessment & Plan Note (Signed)
Lab Results  Component Value Date   LDLCALC 64 01/31/2022   Stable, pt to continue current statin  - low chol diet

## 2022-09-10 NOTE — Assessment & Plan Note (Signed)
Also for psa with labs,  to f/u any worsening symptoms or concerns

## 2022-09-10 NOTE — Assessment & Plan Note (Signed)
Exam benign, non toxic, but for urine culture

## 2022-09-10 NOTE — Assessment & Plan Note (Signed)
Lab Results  Component Value Date   HGBA1C 5.8 01/31/2022   Stable, pt to continue current medical treatment  - diet, wt control, excercise

## 2022-09-11 ENCOUNTER — Encounter: Payer: Self-pay | Admitting: Internal Medicine

## 2022-09-11 LAB — VITAMIN D 25 HYDROXY (VIT D DEFICIENCY, FRACTURES): VITD: 65.95 ng/mL (ref 30.00–100.00)

## 2022-09-12 ENCOUNTER — Telehealth: Payer: Self-pay | Admitting: Internal Medicine

## 2022-09-12 NOTE — Telephone Encounter (Signed)
David Kim states that they did not receive the referral for patient to have home health.  Please refax to [phone]

## 2022-09-14 NOTE — Telephone Encounter (Signed)
After reviewing this referral, it looks as though the patient refused due to cost and having to pay out of pocket. His insurance will not cover all that is needed.

## 2022-09-15 ENCOUNTER — Encounter (HOSPITAL_COMMUNITY): Payer: Self-pay

## 2022-09-15 ENCOUNTER — Other Ambulatory Visit: Payer: Self-pay

## 2022-09-15 ENCOUNTER — Emergency Department (HOSPITAL_COMMUNITY)
Admission: EM | Admit: 2022-09-15 | Discharge: 2022-09-20 | Disposition: A | Discharge status: Home / Self Care | Payer: Medicare Other | Attending: Emergency Medicine | Admitting: Emergency Medicine

## 2022-09-15 DIAGNOSIS — Z20822 Contact with and (suspected) exposure to covid-19: Secondary | ICD-10-CM | POA: Insufficient documentation

## 2022-09-15 DIAGNOSIS — Z79899 Other long term (current) drug therapy: Secondary | ICD-10-CM | POA: Insufficient documentation

## 2022-09-15 DIAGNOSIS — F32A Depression, unspecified: Secondary | ICD-10-CM | POA: Diagnosis not present

## 2022-09-15 DIAGNOSIS — I10 Essential (primary) hypertension: Secondary | ICD-10-CM | POA: Insufficient documentation

## 2022-09-15 DIAGNOSIS — Z7982 Long term (current) use of aspirin: Secondary | ICD-10-CM | POA: Insufficient documentation

## 2022-09-15 DIAGNOSIS — R000 Tachycardia, unspecified: Secondary | ICD-10-CM | POA: Diagnosis not present

## 2022-09-15 DIAGNOSIS — F4323 Adjustment disorder with mixed anxiety and depressed mood: Secondary | ICD-10-CM | POA: Insufficient documentation

## 2022-09-15 DIAGNOSIS — F322 Major depressive disorder, single episode, severe without psychotic features: Secondary | ICD-10-CM | POA: Diagnosis present

## 2022-09-15 DIAGNOSIS — Z046 Encounter for general psychiatric examination, requested by authority: Secondary | ICD-10-CM

## 2022-09-15 DIAGNOSIS — F332 Major depressive disorder, recurrent severe without psychotic features: Secondary | ICD-10-CM | POA: Insufficient documentation

## 2022-09-15 DIAGNOSIS — R259 Unspecified abnormal involuntary movements: Secondary | ICD-10-CM | POA: Diagnosis not present

## 2022-09-15 NOTE — ED Triage Notes (Signed)
Pt BIB [facility] with IVC papers. Per IVC paperwork "respondent is paraplegic. Has not ate in two days or taken a bath. Has been threatening to shoot self and admits to being depressed. Says he had died before." See paperwork for further details.

## 2022-09-15 NOTE — ED Provider Notes (Signed)
[name] Note   CSN: [id number] Arrival date & time: 09/15/22  2306     History {Add pertinent medical, surgical, social history, OB history to HPI:1} Chief Complaint  Patient presents with   IVC    David Kim is a 76 y.o. male.  The history is provided by the patient and medical records.   76 y.o. M with hx of spinal cord injury, anxiety, depression, ED, GERD, HLP, HTN, Vit D deficiency, presenting to the ED under IVC petitioned by brother-in-law.  Reportedly, patient has been depressed and expressed wanting to die several times.  He is refusing to eat or to take a bath.  He does have a gun safe at home full of weapons and has made homemade bombs with batteries wired together.  Apparently, home health staff will no longer enter the home due to these hazards.  Patient denies these claims.  He admits to having a gun safe at home, however states he cannot open it because his hands will not turn the dial to enter the combination.  He just continues saying "they lied" when asked about statements made in the IVC paperwork.  He denies physical complaints at this time.  Home Medications Prior to Admission medications   Medication Sig Start Date End Date Taking? Authorizing Provider  aspirin 81 MG EC tablet Take 81 mg by mouth daily.    [provider]  baclofen (LIORESAL) 20 MG tablet TAKE 1 TABLET(20 MG) BY MOUTH FOUR TIMES DAILY 02/16/22   Meredith Staggers, MD  calcium citrate-vitamin D 500-500 MG-UNIT chewable tablet Chew 1 tablet by mouth daily. 10/01/19   Love, Ivan Anchors, PA-C  cholecalciferol (VITAMIN D3) 25 MCG (1000 UT) tablet Take 1,000 Units by mouth daily.    [provider]  citalopram (CELEXA) 10 MG tablet Take 1 tablet (10 mg total) by mouth daily. 09/08/22 09/08/23  Biagio Borg, MD  clonazePAM (KLONOPIN) 2 MG tablet TAKE 2 TABLETS(4 MG) BY MOUTH AT BEDTIME 06/06/22   Meredith Staggers, MD  clonazePAM (KLONOPIN)  2 MG tablet Take 2 tablets (4 mg total) by mouth at bedtime. 08/21/22   Meredith Staggers, MD  losartan (COZAAR) 50 MG tablet TAKE 1 TABLET(50 MG) BY MOUTH TWICE DAILY 01/31/22   Biagio Borg, MD  Multiple Vitamin (MULTIVITAMIN) capsule Take 1 capsule by mouth daily.    [provider]  Multiple Vitamins-Minerals (ZINC PO) Take by mouth.    [provider]  oxybutynin (DITROPAN) 5 MG tablet Take 5 mg by mouth 4 (four) times daily.    [provider]  tiZANidine (ZANAFLEX) 4 MG tablet TAKE 1-2 TABLETS BY MOUTH FOUR TIMES DAILY 09/04/22   Meredith Staggers, MD  vitamin C (ASCORBIC ACID) 500 MG tablet Take 500 mg by mouth daily.    [provider]      Allergies    Amoxicillin and Endal hd    Review of Systems   Review of Systems  Psychiatric/Behavioral:  Positive for suicidal ideas.        Depression  All other systems reviewed and are negative.   Physical Exam Updated Vital Signs BP 135/71 (BP Location: Right Arm)   Pulse 93   Temp 98.6 F (37 C) (Oral)   Resp 16   SpO2 95%   Physical Exam Vitals and nursing note reviewed.  Constitutional:      Appearance: He is well-developed.  HENT:     Head: Normocephalic and  atraumatic.  Eyes:     Conjunctiva/sclera: Conjunctivae normal.     Pupils: Pupils are equal, round, and reactive to light.  Cardiovascular:     Rate and Rhythm: Normal rate and regular rhythm.     Heart sounds: Normal heart sounds.  Pulmonary:     Effort: Pulmonary effort is normal. No respiratory distress.     Breath sounds: Normal breath sounds. No rhonchi.  Abdominal:     General: Bowel sounds are normal.     Palpations: Abdomen is soft.  Musculoskeletal:        General: Normal range of motion.     Cervical back: Normal range of motion.  Skin:    General: Skin is warm and dry.  Neurological:     Mental Status: He is alert and oriented to person, place, and time.     ED Results / Procedures / Treatments    Labs (all labs ordered are listed, but only abnormal results are displayed) Labs Reviewed - No data to display  EKG None  Radiology No results found.  Procedures Procedures  {Document cardiac monitor, telemetry assessment procedure when appropriate:1}  Medications Ordered in ED Medications - No data to display  ED Course/ Medical Decision Making/ A&P                           Medical Decision Making  ***  {Document critical care time when appropriate:1} {Document review of labs and clinical decision tools ie heart score, Chads2Vasc2 etc:1}  {Document your independent review of radiology images, and any outside records:1} {Document your discussion with family members, caretakers, and with consultants:1} {Document social determinants of health affecting pt's care:1} {Document your decision making why or why not admission, treatments were needed:1} Final Clinical Impression(s) / ED Diagnoses Final diagnoses:  None    Rx / DC Orders ED Discharge Orders     None

## 2022-09-16 DIAGNOSIS — F322 Major depressive disorder, single episode, severe without psychotic features: Secondary | ICD-10-CM | POA: Diagnosis present

## 2022-09-16 DIAGNOSIS — F4323 Adjustment disorder with mixed anxiety and depressed mood: Secondary | ICD-10-CM | POA: Diagnosis present

## 2022-09-16 LAB — COMPREHENSIVE METABOLIC PANEL
ALT: 31 U/L (ref 0–44)
AST: 31 U/L (ref 15–41)
Albumin: 4.5 g/dL (ref 3.5–5.0)
Alkaline Phosphatase: 69 U/L (ref 38–126)
Anion gap: 7 (ref 5–15)
BUN: 22 mg/dL (ref 8–23)
CO2: 25 mmol/L (ref 22–32)
Calcium: 8.8 mg/dL — ABNORMAL LOW (ref 8.9–10.3)
Chloride: 109 mmol/L (ref 98–111)
Creatinine, Ser: 0.71 mg/dL (ref 0.61–1.24)
GFR, Estimated: >60 mL/min (ref >60)
Glucose, Bld: 126 mg/dL — ABNORMAL HIGH (ref 70–99)
Potassium: 3.3 mmol/L — ABNORMAL LOW (ref 3.5–5.1)
Sodium: 141 mmol/L (ref 135–145)
Total Bilirubin: 1.2 mg/dL (ref 0.3–1.2)
Total Protein: 7.4 g/dL (ref 6.5–8.1)

## 2022-09-16 LAB — CBC WITH DIFFERENTIAL/PLATELET
Abs Immature Granulocytes: 0.05 10*3/uL (ref 0.00–0.07)
Basophils Absolute: 0.1 10*3/uL (ref 0.0–0.1)
Basophils Relative: 1 %
Eosinophils Absolute: 0.5 10*3/uL (ref 0.0–0.5)
Eosinophils Relative: 4 %
HCT: 51.8 % (ref 39.0–52.0)
Hemoglobin: 16.5 g/dL (ref 13.0–17.0)
Immature Granulocytes: 1 %
Lymphocytes Relative: 11 %
Lymphs Abs: 1.2 10*3/uL (ref 0.7–4.0)
MCH: 29.5 pg (ref 26.0–34.0)
MCHC: 31.9 g/dL (ref 30.0–36.0)
MCV: 92.5 fL (ref 80.0–100.0)
Monocytes Absolute: 0.8 10*3/uL (ref 0.1–1.0)
Monocytes Relative: 7 %
Neutro Abs: 8.4 10*3/uL — ABNORMAL HIGH (ref 1.7–7.7)
Neutrophils Relative %: 76 %
Platelets: 233 10*3/uL (ref 150–400)
RBC: 5.6 MIL/uL (ref 4.22–5.81)
RDW: 13.4 % (ref 11.5–15.5)
WBC: 11.1 10*3/uL — ABNORMAL HIGH (ref 4.0–10.5)
nRBC: 0 % (ref 0.0–0.2)

## 2022-09-16 LAB — URINALYSIS, ROUTINE W REFLEX MICROSCOPIC
Bilirubin Urine: NEGATIVE
Glucose, UA: 50 mg/dL — AB
Hgb urine dipstick: NEGATIVE
Ketones, ur: 80 mg/dL — AB
Leukocytes,Ua: NEGATIVE
Nitrite: NEGATIVE
Protein, ur: NEGATIVE mg/dL
Specific Gravity, Urine: 1.024 (ref 1.005–1.030)
pH: 5 (ref 5.0–8.0)

## 2022-09-16 LAB — ACETAMINOPHEN LEVEL: Acetaminophen (Tylenol), Serum: <10 ug/mL — ABNORMAL LOW (ref 10–30)

## 2022-09-16 LAB — RAPID URINE DRUG SCREEN, HOSP PERFORMED
Amphetamines: NOT DETECTED
Barbiturates: NOT DETECTED
Benzodiazepines: POSITIVE — AB
Cocaine: NOT DETECTED
Opiates: NOT DETECTED
Tetrahydrocannabinol: NOT DETECTED

## 2022-09-16 LAB — SALICYLATE LEVEL: Salicylate Lvl: <7 mg/dL — ABNORMAL LOW (ref 7.0–30.0)

## 2022-09-16 LAB — RESP PANEL BY RT-PCR (FLU A&B, COVID) ARPGX2
Influenza A by PCR: NEGATIVE
Influenza B by PCR: NEGATIVE
SARS Coronavirus 2 by RT PCR: NEGATIVE

## 2022-09-16 LAB — ETHANOL: Alcohol, Ethyl (B): <10 mg/dL (ref <10)

## 2022-09-16 MED ORDER — OXYBUTYNIN CHLORIDE 5 MG PO TABS
5.0000 mg | ORAL_TABLET | Freq: Four times a day (QID) | ORAL | Status: DC
  Administered 2022-09-16 – 2022-09-20 (×14): 5 mg via ORAL
  Filled 2022-09-16 (×16): qty 1

## 2022-09-16 MED ORDER — ADULT MULTIVITAMIN W/MINERALS CH
1.0000 | ORAL_TABLET | Freq: Every day | ORAL | Status: DC
  Administered 2022-09-17 – 2022-09-20 (×4): 1 via ORAL
  Filled 2022-09-16 (×4): qty 1

## 2022-09-16 MED ORDER — DIVALPROEX SODIUM ER 500 MG PO TB24
500.0000 mg | ORAL_TABLET | Freq: Every day | ORAL | Status: DC
  Administered 2022-09-16 – 2022-09-19 (×4): 500 mg via ORAL
  Filled 2022-09-16 (×4): qty 1

## 2022-09-16 MED ORDER — TIZANIDINE HCL 4 MG PO TABS: 4.0000 mg | ORAL_TABLET | Freq: Four times a day (QID) | ORAL | Status: DC | PRN

## 2022-09-16 MED ORDER — LOSARTAN POTASSIUM 50 MG PO TABS
50.0000 mg | ORAL_TABLET | Freq: Two times a day (BID) | ORAL | Status: DC
  Administered 2022-09-16 – 2022-09-20 (×9): 50 mg via ORAL
  Filled 2022-09-16 (×2): qty 1
  Filled 2022-09-16: qty 2
  Filled 2022-09-16 (×4): qty 1
  Filled 2022-09-16: qty 2
  Filled 2022-09-16 (×2): qty 1

## 2022-09-16 MED ORDER — OYSTER SHELL CALCIUM/D3 500-5 MG-MCG PO TABS
1.0000 | ORAL_TABLET | Freq: Every day | ORAL | Status: DC
  Administered 2022-09-17 – 2022-09-20 (×4): 1 via ORAL
  Filled 2022-09-16 (×4): qty 1

## 2022-09-16 MED ORDER — ASPIRIN 81 MG PO TBEC
81.0000 mg | DELAYED_RELEASE_TABLET | Freq: Every day | ORAL | Status: DC
  Administered 2022-09-16 – 2022-09-20 (×5): 81 mg via ORAL
  Filled 2022-09-16 (×5): qty 1

## 2022-09-16 MED ORDER — VITAMIN D 25 MCG (1000 UNIT) PO TABS
1000.0000 [IU] | ORAL_TABLET | Freq: Every day | ORAL | Status: DC
  Administered 2022-09-16 – 2022-09-20 (×5): 1000 [IU] via ORAL
  Filled 2022-09-16 (×5): qty 1

## 2022-09-16 MED ORDER — CLONAZEPAM 0.5 MG PO TABS: 4.0000 mg | ORAL_TABLET | Freq: Every day | ORAL | Status: DC

## 2022-09-16 MED ORDER — CLONAZEPAM 1 MG PO TABS
3.0000 mg | ORAL_TABLET | Freq: Every day | ORAL | Status: DC
  Administered 2022-09-16: 3 mg via ORAL
  Filled 2022-09-16: qty 6

## 2022-09-16 MED ORDER — BACLOFEN 10 MG PO TABS
40.0000 mg | ORAL_TABLET | Freq: Four times a day (QID) | ORAL | Status: DC
  Administered 2022-09-16 – 2022-09-20 (×14): 40 mg via ORAL
  Filled 2022-09-16 (×14): qty 4

## 2022-09-16 MED ORDER — CITALOPRAM HYDROBROMIDE 10 MG PO TABS
10.0000 mg | ORAL_TABLET | Freq: Every day | ORAL | Status: DC
  Administered 2022-09-16 – 2022-09-20 (×5): 10 mg via ORAL
  Filled 2022-09-16 (×5): qty 1
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
## 2022-09-16 NOTE — ED Notes (Addendum)
Pt has been changed out into purple scrubs and two bags of belongings:  Shorts, socks, shoes,  shirt, jacket, watch, phone, writing utensils, and IDs Security was called to want the patient.   Pts belongings stored behind nurses station (9-12; 23-25) in cabinet labeled "Patients belongings 23-25, David Kim"

## 2022-09-16 NOTE — Progress Notes (Signed)
PT Cancellation Note  Patient Details Name: David Kim MRN: [id number] DOB: 1946/07/20   Cancelled Treatment:    Reason Eval/Treat Not Completed: Other (comment)  Order for PT ASAP but pt with IVC status and not imminently discharging.  Will f/u as able.   Abran Richard, PT Acute Rehab [facility] Rehab [phone]  [name] 09/16/2022, 12:58 PM

## 2022-09-16 NOTE — Consult Note (Cosign Needed Addendum)
[place] ED ASSESSMENT   Reason for Consult:  Psychiatry evaluation Referring Physician:  ER Physician Patient Identification: David Kim MRN:  [id number] ED Chief Complaint: Severe major depression, single episode, without psychotic features ([place])  Diagnosis:  Principal Problem:   Severe major depression, single episode, without psychotic features ([place]) Active Problems:   Persistent adjustment disorder with mixed anxiety and depressed mood   ED Assessment Time Calculation: Start Time: 1113 Stop Time: 1135 Total Time in Minutes (Assessment Completion): 22   Subjective:   David Kim is a 76 y.o. male patient admitted with previous hx of Paraplegia, anxiety and recent diagnosis of Depression after his wife died two weeks ago.  Patient presented to the ER under IVC taken out by his brother in law.  He has been depressed since wife passed on and expressed wanting to die.  This morning patient admitted that he is depressed, hopeless and helpless.  HPI:  Patient was seen this morning by this provider for evaluation.  Patient barely made eye contact with provider.  Patient rated depression 10/10 with 10 being severe depression.  He also added that depressed mood is new to him since his wife died.  His PCP prescribed Celexa 10 MG daily which he did not start before coming to the hospital.  Patient reports that since his wife passed on he has not been leaving his home because he cannot drive.  He did not pick his his prescribed Celexa until he came to the ER.  Patient reports that he need assistance with care, mobility and that he does not know where to seek help.  He reported poor sleep and appetite. Collateral information from David Kim ([phone]) niece is that she called and did not hear from her uncle.   When she finally heard from him he was sounding groggy and did not make sense of what he was say. She sent Police for welfare check and on getting there patient was sleeping lying on his  stool, urine.  He has not been taking care of his ADLS because he could not do so as a paraplegic.  MS [name] reports that report was made to social services who sent a SW to the house.  Patient became aggressive to sw who then left.  Home health staff came to see him but could not as patient became aggressive towards the staff.  They also cannot send staff due to the unsafe environment and they they not do rectal stimulation for bowel movement.  [name] reported that patient has been saying he want to die and he has guns in the house locked up.  Patient have tried several times to open the locked safe where Guns are kept but fortunately could not. Based on information gathered patient needs placement to a facility.  Home medications are resumed including his Celexa.  Depakote is added for mood stabilization/anger management.  TOC is in for evaluation pf post hospitalization needs/Placement.  We will seek admission placement at any facility with available bed.  Past Psychiatric History: Hx is significant for depression, anxiety, Adjustment disorder with mixed depression/anxiety.  No previous hx of previous Psychiatric care in the past.  Risk to Self or Others: Is the patient at risk to self? Yes Has the patient been a risk to self in the past 6 months? Yes Has the patient been a risk to self within the distant past? Yes Is the patient a risk to others? No Has the patient been a risk to others in  the past 6 months? No Has the patient been a risk to others within the distant past? No  [place] Scale:  [facility] ED from 09/15/2022 in [place] High Risk       AIMS:  , , ,  ,   ASAM:    Substance Abuse:  Alcohol / Drug Use Pain Medications: See PTA medication list Prescriptions: See PTA medication list Over the Counter: See PTA medication list History of alcohol / drug use?: No history of alcohol / drug abuse Withdrawal Symptoms:  None  Past Medical History:  Past Medical History:  Diagnosis Date   Allergic rhinitis    ALLERGIC RHINITIS 10/01/2007   Qualifier: Diagnosis of  By: Jenny Reichmann MD, Hunt Oris    Anxiety    Carpal tunnel syndrome    right   Depression    Depression    Erectile dysfunction    Fatigue    GERD (gastroesophageal reflux disease)    Gout    Hypertension    HYPOGONADISM 03/31/2008   Qualifier: Diagnosis of  By: Jenny Reichmann MD, Hunt Oris    IBS (irritable bowel syndrome)    Low back pain    OSA (obstructive sleep apnea)    Paraplegia ([place]) 04/23/2009   Qualifier: Diagnosis of  By: Jenny Reichmann MD, Hunt Oris    Peptic ulcer disease    PEPTIC ULCER DISEASE 10/01/2007   Qualifier: Diagnosis of  By: Jenny Reichmann MD, Hunt Oris    Prostate cancer [facility])    PROSTATE CANCER, HX OF 05/23/2007   Qualifier: Diagnosis of  By: Jenny Reichmann MD, [place] PARALYSIS 04/23/2009   Qualifier: Diagnosis of  By: Jenny Reichmann MD, Hunt Oris    Thoracic spinal cord injury ([place]) 03/12/2012    Past Surgical History:  Procedure Laterality Date   ARTERY AND TENDON REPAIR Left 09/06/2018   Procedure: ARTERY AND TENDON REPAIR;  Surgeon: Charlotte Crumb, MD;  Location: [facility];  Service: Orthopedics;  Laterality: Left;   CAST APPLICATION Left 25/02/2705   Procedure: CAST APPLICATION;  Surgeon: Charlotte Crumb, MD;  Location: [facility];  Service: Orthopedics;  Laterality: Left;   inguinal heniorrhaphy     left leg surgery after fibula     NERVE REPAIR Left 09/06/2018   Procedure: NERVE REPAIR TIMES TWO;  Surgeon: Charlotte Crumb, MD;  Location: [place];  Service: Orthopedics;  Laterality: Left;   OPEN REDUCTION INTERNAL FIXATION (ORIF) DISTAL PHALANX Left 09/06/2018   Procedure: OPEN REDUCTION INTERNAL FIXATION (ORIF) LEFT LONG FINGER;  Surgeon: Charlotte Crumb, MD;  Location: [facility];  Service: Orthopedics;  Laterality: Left;   PILONIDAL CYST / SINUS EXCISION     PROSTATECTOMY     tonsillectomy     WOUND EXPLORATION Left 09/06/2018   Procedure: EXPLORATION  OFCOMPLEX INJURY;  Surgeon: Charlotte Crumb, MD;  Location: [facility];  Service: Orthopedics;  Laterality: Left;   Family History:  Family History  Problem Relation Age of Onset   High blood pressure Mother    Dementia Mother    Diabetes Father    Family Psychiatric  History: Denies. Social History:  Social History   Substance and Sexual Activity  Alcohol Use No     Social History   Substance and Sexual Activity  Drug Use No    Social History   Socioeconomic History   Marital status: Married    Spouse name: [name]   Number of children: 0   Years of education: 12  Highest education level: Not on file  Occupational History   Occupation: retired    Fish farm manager: RETIRED  Tobacco Use   Smoking status: Never   Smokeless tobacco: Never  Vaping Use   Vaping Use: Never used  Substance and Sexual Activity   Alcohol use: No   Drug use: No   Sexual activity: Not on file  Other Topics Concern   Not on file  Social History Narrative   Patient lives at home with [name]). Patient is disabled. Patient has 12 th grade education.   Right handed.   Caffeine- None   Social Determinants of Health   Financial Resource Strain: Low Risk  (07/24/2022)   Overall Financial Resource Strain (CARDIA)    Difficulty of Paying Living Expenses: Not hard at all  Food Insecurity: No Food Insecurity (07/24/2022)   Hunger Vital Sign    Worried About Running Out of Food in the Last Year: Never true    Ran Out of Food in the Last Year: Never true  Transportation Needs: No Transportation Needs (07/24/2022)   PRAPARE - Hydrologist (Medical): No    Lack of Transportation (Non-Medical): No  Physical Activity: Inactive (07/24/2022)   Exercise Vital Sign    Days of Exercise per Week: 0 days    Minutes of Exercise per Session: 0 min  Stress: No Stress Concern Present (07/24/2022)   [place]    Feeling  of Stress : Not at all  Social Connections: Moderately Isolated (07/24/2022)   Social Connection and Isolation Panel [NHANES]    Frequency of Communication with Friends and Family: More than three times a week    Frequency of Social Gatherings with Friends and Family: More than three times a week    Attends Religious Services: Never    Marine scientist or Organizations: No    Attends Archivist Meetings: Never    Marital Status: Married   Additional Social History:    Allergies:   Allergies  Allergen Reactions   Amoxicillin Other (See Comments)    Unknown    Endal Hd Other (See Comments)    Unknown     Labs:  Results for orders placed or performed during the hospital encounter of 09/15/22 (from the past 48 hour(s))  CBC with Differential     Status: Abnormal   Collection Time: 09/15/22 11:52 PM  Result Value Ref Range   WBC 11.1 (H) 4.0 - 10.5 K/uL   RBC 5.60 4.22 - 5.81 MIL/uL   Hemoglobin 16.5 13.0 - 17.0 g/dL   HCT 51.8 39.0 - 52.0 %   MCV 92.5 80.0 - 100.0 fL   MCH 29.5 26.0 - 34.0 pg   MCHC 31.9 30.0 - 36.0 g/dL   RDW 13.4 11.5 - 15.5 %   Platelets 233 150 - 400 K/uL   nRBC 0.0 0.0 - 0.2 %   Neutrophils Relative % 76 %   Neutro Abs 8.4 (H) 1.7 - 7.7 K/uL   Lymphocytes Relative 11 %   Lymphs Abs 1.2 0.7 - 4.0 K/uL   Monocytes Relative 7 %   Monocytes Absolute 0.8 0.1 - 1.0 K/uL   Eosinophils Relative 4 %   Eosinophils Absolute 0.5 0.0 - 0.5 K/uL   Basophils Relative 1 %   Basophils Absolute 0.1 0.0 - 0.1 K/uL   Immature Granulocytes 1 %   Abs Immature Granulocytes 0.05 0.00 - 0.07 K/uL  Comment: Performed at [facility], [place] [address], [place], [place] [address]  Comprehensive metabolic panel     Status: Abnormal   Collection Time: 09/15/22 11:52 PM  Result Value Ref Range   Sodium 141 135 - 145 mmol/L   Potassium 3.3 (L) 3.5 - 5.1 mmol/L   Chloride 109 98 - 111 mmol/L   CO2 25 22 - 32 mmol/L   Glucose, Bld 126 (H) 70  - 99 mg/dL    Comment: Glucose reference range applies only to samples taken after fasting for at least 8 hours.   BUN 22 8 - 23 mg/dL   Creatinine, Ser 0.71 0.61 - 1.24 mg/dL   Calcium 8.8 (L) 8.9 - 10.3 mg/dL   Total Protein 7.4 6.5 - 8.1 g/dL   Albumin 4.5 3.5 - 5.0 g/dL   AST 31 15 - 41 U/L   ALT 31 0 - 44 U/L   Alkaline Phosphatase 69 38 - 126 U/L   Total Bilirubin 1.2 0.3 - 1.2 mg/dL   GFR, Estimated >60 >60 mL/min    Comment: (NOTE) Calculated using the CKD-EPI Creatinine Equation (2021)    Anion gap 7 5 - 15    Comment: Performed at [facility], [place] [address] [place], [place] [address]  Ethanol     Status: None   Collection Time: 09/15/22 11:52 PM  Result Value Ref Range   Alcohol, Ethyl (B) <10 <10 mg/dL    Comment: (NOTE) Lowest detectable limit for serum alcohol is 10 mg/dL.  For medical purposes only. Performed at [facility], [place] [address], [place], [place] [address]   Salicylate level     Status: Abnormal   Collection Time: 09/15/22 11:52 PM  Result Value Ref Range   Salicylate Lvl <3.6 (L) 7.0 - 30.0 mg/dL    Comment: Performed at [facility], [place] [address] [place], [place] [address]  Acetaminophen level     Status: Abnormal   Collection Time: 09/15/22 11:52 PM  Result Value Ref Range   Acetaminophen (Tylenol), Serum <10 (L) 10 - 30 ug/mL    Comment: (NOTE) Therapeutic concentrations vary significantly. A range of 10-30 ug/mL  may be an effective concentration for many patients. However, some  are best treated at concentrations outside of this range. Acetaminophen concentrations >150 ug/mL at 4 hours after ingestion  and >50 ug/mL at 12 hours after ingestion are often associated with  toxic reactions.  Performed at [facility], [place] [address] [place], [place] [address]   Resp Panel by RT-PCR (Flu A&B, Covid) Anterior Nasal Swab     Status: None   Collection Time:  09/15/22 11:52 PM   Specimen: Anterior Nasal Swab  Result Value Ref Range   SARS Coronavirus 2 by RT PCR NEGATIVE NEGATIVE    Comment: (NOTE) SARS-CoV-2 target nucleic acids are NOT DETECTED.  The SARS-CoV-2 RNA is generally detectable in upper respiratory specimens during the acute phase of infection. The lowest concentration of SARS-CoV-2 viral copies this assay can detect is 138 copies/mL. A negative result does not preclude SARS-Cov-2 infection and should not be used as the sole basis for treatment or other patient management decisions. A negative result may occur with  improper specimen collection/handling, submission of specimen other than nasopharyngeal swab, presence of viral mutation(s) within the areas targeted by this assay, and inadequate number of viral copies(<138 copies/mL). A negative result must be combined with clinical observations, patient history, and epidemiological information. The  expected result is Negative.  Fact Sheet for Patients:  EntrepreneurPulse.com.au  Fact Sheet for Healthcare Providers:  IncredibleEmployment.be  This test is no t yet approved or cleared by the [place] FDA and  has been authorized for detection and/or diagnosis of SARS-CoV-2 by FDA under an Emergency Use Authorization (EUA). This EUA will remain  in effect (meaning this test can be used) for the duration of the COVID-19 declaration under Section 564(b)(1) of the Act, 21 U.S.C.section 360bbb-3(b)(1), unless the authorization is terminated  or revoked sooner.       Influenza A by PCR NEGATIVE NEGATIVE   Influenza B by PCR NEGATIVE NEGATIVE    Comment: (NOTE) The Xpert Xpress SARS-CoV-2/FLU/RSV plus assay is intended as an aid in the diagnosis of influenza from Nasopharyngeal swab specimens and should not be used as a sole basis for treatment. Nasal washings and aspirates are unacceptable for Xpert Xpress  SARS-CoV-2/FLU/RSV testing.  Fact Sheet for Patients: EntrepreneurPulse.com.au  Fact Sheet for Healthcare Providers: IncredibleEmployment.be  This test is not yet approved or cleared by the [place] FDA and has been authorized for detection and/or diagnosis of SARS-CoV-2 by FDA under an Emergency Use Authorization (EUA). This EUA will remain in effect (meaning this test can be used) for the duration of the COVID-19 declaration under Section 564(b)(1) of the Act, 21 U.S.C. section 360bbb-3(b)(1), unless the authorization is terminated or revoked.  Performed at [facility], [place] [address], [place], [place] [address]     Current Facility-Administered Medications  Medication Dose Route Frequency Provider Last Rate Last Admin   aspirin EC tablet 81 mg  81 mg Oral Daily Larene Pickett, PA-C   81 mg at 09/16/22 1941   baclofen (LIORESAL) tablet 40 mg  40 mg Oral QID Larene Pickett, PA-C   40 mg at 09/16/22 1022   calcium citrate-vitamin D 500-500 MG-UNIT per chewable tablet 1 tablet  1 tablet Oral Daily Larene Pickett, PA-C       cholecalciferol (VITAMIN D3) 25 MCG (1000 UNIT) tablet 1,000 Units  1,000 Units Oral Daily Larene Pickett, PA-C   1,000 Units at 09/16/22 7408   citalopram (CELEXA) tablet 10 mg  10 mg Oral Daily Larene Pickett, PA-C   10 mg at 09/16/22 1022   clonazePAM (KLONOPIN) tablet 3 mg  3 mg Oral QHS Charmaine Downs C, NP       divalproex (DEPAKOTE ER) 24 hr tablet 500 mg  500 mg Oral QHS Anaissa Macfadden C, NP       losartan (COZAAR) tablet 50 mg  50 mg Oral BID Larene Pickett, PA-C   50 mg at 09/16/22 1022   multivitamin capsule 1 capsule  1 capsule Oral Daily Quincy Carnes M, PA-C       oxybutynin [facility]) tablet 5 mg  5 mg Oral QID Larene Pickett, PA-C   5 mg at 09/16/22 1025   tiZANidine (ZANAFLEX) tablet 4 mg  4 mg Oral Q6H PRN Larene Pickett, PA-C       Current Outpatient Medications   Medication Sig Dispense Refill   aspirin 81 MG EC tablet Take 81 mg by mouth daily.     baclofen (LIORESAL) 20 MG tablet TAKE 1 TABLET(20 MG) BY MOUTH FOUR TIMES DAILY 360 tablet 4   clonazePAM (KLONOPIN) 2 MG tablet Take 2 tablets (4 mg total) by mouth at bedtime. 16 tablet 0   oxybutynin (DITROPAN) 5 MG tablet Take 5 mg by mouth 4 (  four) times daily.     tiZANidine (ZANAFLEX) 4 MG tablet TAKE 1-2 TABLETS BY MOUTH FOUR TIMES DAILY (Patient taking differently: Take 4 mg by mouth at bedtime.) 720 tablet 2   calcium citrate-vitamin D 500-500 MG-UNIT chewable tablet Chew 1 tablet by mouth daily.     cholecalciferol (VITAMIN D3) 25 MCG (1000 UT) tablet Take 1,000 Units by mouth daily.     citalopram (CELEXA) 10 MG tablet Take 1 tablet (10 mg total) by mouth daily. 90 tablet 3   losartan (COZAAR) 50 MG tablet TAKE 1 TABLET(50 MG) BY MOUTH TWICE DAILY 180 tablet 3   Multiple Vitamin (MULTIVITAMIN) capsule Take 1 capsule by mouth daily.     Multiple Vitamins-Minerals (ZINC PO) Take by mouth.     vitamin C (ASCORBIC ACID) 500 MG tablet Take 500 mg by mouth daily.      Musculoskeletal: Strength & Muscle Tone:  in bed, paraplegic Gait & Station:  Paraplegic Patient leans:  see above.   Psychiatric Specialty Exam: Presentation  General Appearance:  Casual  Eye Contact: Fleeting  Speech: Clear and Coherent; Normal Rate  Speech Volume: Normal  Handedness: Right   Mood and Affect  Mood: Angry; Depressed; Hopeless; Irritable; Labile  Affect: Congruent; Depressed; Labile   Thought Process  Thought Processes: Coherent; Goal Directed  Descriptions of Associations:Intact  Orientation:Full (Time, Place and Person)  Thought Content:Logical  History of Schizophrenia/Schizoaffective disorder:No  Duration of Psychotic Symptoms:No data recorded Hallucinations:Hallucinations: None  Ideas of Reference:None  Suicidal Thoughts:Suicidal Thoughts: Yes, Passive SI Passive Intent  and/or Plan: With Intent; With Plan; Without Access to Means; Without Means to Carry Out  Homicidal Thoughts:Homicidal Thoughts: No   Sensorium  Memory: Immediate Good; Recent Good; Remote Good  Judgment: Impaired  Insight: Fair   Materials engineer: Fair  Attention Span: Fair  Recall: Good  Fund of Knowledge: Good  Language: Good  Psychomotor Activity  Psychomotor Activity: Psychomotor Activity: Psychomotor Retardation   Assets  Assets: Communication Skills; Housing; Social Support; Financial Resources/Insurance  Sleep  Sleep: Sleep: Fair  Physical Exam: Physical Exam Vitals and nursing note reviewed.  Constitutional:      Appearance: Normal appearance.  HENT:     Head: Normocephalic and atraumatic.     Nose: Nose normal.  Cardiovascular:     Rate and Rhythm: Tachycardia present.  Pulmonary:     Effort: Pulmonary effort is normal.  Musculoskeletal:     Cervical back: Normal range of motion.     Comments: Paraplegic-inability to move around.  Skin:    General: Skin is warm and dry.  Neurological:     Mental Status: He is alert and oriented to person, place, and time.    Review of Systems  Constitutional: Negative.   HENT: Negative.    Eyes: Negative.   Respiratory: Negative.    Cardiovascular: Negative.   Gastrointestinal: Negative.   Genitourinary: Negative.   Musculoskeletal:        Paraplegic - inability to move around.  Skin: Negative.   Neurological:        Paraplegic   Endo/Heme/Allergies: Negative.   Psychiatric/Behavioral:  Positive for depression. The patient is nervous/anxious.    Blood pressure (!) 163/87, pulse (!) 105, temperature 98 F (36.7 C), temperature source Oral, resp. rate 16, SpO2 95 %. There is no height or weight on file to calculate BMI.  Medical Decision Making: Patient meets criteria for inpatient Psychiatric  hospitalization.  TOC is in to look into placement post hospitalization.  We  have started Celexa 10 mg for depression, Depakote ER 24 hrs  500 mg for mood.  Patient has been on Klonopin  for 15 years and is currently taking 4 mg at bed time.  We will decrease the dose based on the information given by niece that states patient is groggy during the day after taking the high dose Klonopin.  Problem 1: Single Episode Major Depressive disorder, severe Psychotic features  Problem 2: Adjustment disorder with mixed anxiety/Depression.  Disposition:  Admit and seek placement.  ROGER KETTLES, NP-PMHNP-BC 09/16/2022 12:13 PM

## 2022-09-16 NOTE — ED Notes (Signed)
Patient hasn't had to urinate yet   he said later

## 2022-09-17 DIAGNOSIS — F322 Major depressive disorder, single episode, severe without psychotic features: Secondary | ICD-10-CM

## 2022-09-17 MED ORDER — CLONAZEPAM 1 MG PO TABS
2.0000 mg | ORAL_TABLET | Freq: Every day | ORAL | Status: DC
  Administered 2022-09-17 – 2022-09-19 (×3): 2 mg via ORAL
  Filled 2022-09-17 (×3): qty 2

## 2022-09-17 NOTE — ED Notes (Signed)
Pt. upset and angry digging in his butt with toilet paper to remove feces from his rectum because he couldn't get his phone. Pt was made aware that he was IVC'd and unable to have his personal items at the moment. Pt. was advised by staff that we are unable to digg feces out of his rectum without a doctors order. Pt stated "well I'm taking my diaper off" and started wiping his behind stating he was "going to sue".

## 2022-09-17 NOTE — ED Notes (Signed)
Two patient belongings bags and IVC paperwork transported with patient to TCU.

## 2022-09-17 NOTE — Progress Notes (Signed)
[name] Psych ED Progress Note  09/17/2022 11:55 AM David Kim  MRN:  [id number]   Subjective:  David Kim is a 76 y.o. male patient admitted with previous hx of Paraplegia, anxiety and recent diagnosis of Depression after his wife died two weeks ago.  Patient presented to the ER under IVC taken out by his brother in law.  He has been depressed since wife passed on and expressed wanting to die.  This morning patient admitted that he is depressed, hopeless and helpless.  Patient was seen in bed this morning awake, alert and oriented x 4.  He presented a slightly bright affect and denied feeling suicidal.  Patient, however is delusional telling provider that his prayers yesterday healed patients in the main ER.  He reports how the patient across from his stretcher was healed from pain after he prayed for her.  Over all five people went home yesterday after he prayed for them/.  Patient denied feeling suicidal today stating his prayers also is healing him.  Patient does not want to move to a facility because he can handle his affairs.  He also believes that all he needs is home health care staff come daily to see him in his home.  Home health staff last week that visited told him that they do not engage in bowel regimen care.  Patient yelled at home healthcare staff and SW last week.  When asked about the incident patient said he is a changed man now and believes in God. We continue to seek inpatient Mental healthcare for patient.  He continues to take Celexa and his Klonopin is decreased today to 2 mg from 3 mg.  Goal is to keep it at 1 mg every night for anxiety and sleep.  He denies SI/HI/AVH.   Principal Problem: Severe major depression, single episode, without psychotic features ([place]) Diagnosis:  Principal Problem:   Severe major depression, single episode, without psychotic features ([place]) Active Problems:   Persistent adjustment disorder with mixed anxiety and depressed mood   ED Assessment  Time Calculation: Start Time: 1113 Stop Time: 2952 Total Time in Minutes (Assessment Completion): 22   Past Psychiatric History: see initial psychiatry note  [place] Scale:  [place] ED from 09/15/2022 in [place] DEPT  C-SSRS RISK CATEGORY High Risk       Past Medical History:  Past Medical History:  Diagnosis Date   Allergic rhinitis    ALLERGIC RHINITIS 10/01/2007   Qualifier: Diagnosis of  By: Jenny Reichmann MD, Hunt Oris    Anxiety    Carpal tunnel syndrome    right   Depression    Depression    Erectile dysfunction    Fatigue    GERD (gastroesophageal reflux disease)    Gout    Hypertension    HYPOGONADISM 03/31/2008   Qualifier: Diagnosis of  By: Jenny Reichmann MD, Hunt Oris    IBS (irritable bowel syndrome)    Low back pain    OSA (obstructive sleep apnea)    Paraplegia ([name]) 04/23/2009   Qualifier: Diagnosis of  By: Jenny Reichmann MD, Hunt Oris    Peptic ulcer disease    PEPTIC ULCER DISEASE 10/01/2007   Qualifier: Diagnosis of  By: Jenny Reichmann MD, Hunt Oris    Prostate cancer [facility])    [place], HX OF 05/23/2007   Qualifier: Diagnosis of  By: Jenny Reichmann MD, [place] PARALYSIS 04/23/2009   Qualifier: Diagnosis of  By: Jenny Reichmann MD, Hunt Oris  Thoracic spinal cord injury ([place]) 03/12/2012    Past Surgical History:  Procedure Laterality Date   ARTERY AND TENDON REPAIR Left 09/06/2018   Procedure: ARTERY AND TENDON REPAIR;  Surgeon: Charlotte Crumb, MD;  Location: [place];  Service: Orthopedics;  Laterality: Left;   CAST APPLICATION Left 78/03/8849   Procedure: CAST APPLICATION;  Surgeon: Charlotte Crumb, MD;  Location: [place];  Service: Orthopedics;  Laterality: Left;   inguinal heniorrhaphy     left leg surgery after fibula     NERVE REPAIR Left 09/06/2018   Procedure: NERVE REPAIR TIMES TWO;  Surgeon: Charlotte Crumb, MD;  Location: [place];  Service: Orthopedics;  Laterality: Left;   OPEN REDUCTION INTERNAL FIXATION (ORIF) DISTAL PHALANX Left 09/06/2018    Procedure: OPEN REDUCTION INTERNAL FIXATION (ORIF) LEFT LONG FINGER;  Surgeon: Charlotte Crumb, MD;  Location: [place];  Service: Orthopedics;  Laterality: Left;   PILONIDAL CYST / SINUS EXCISION     PROSTATECTOMY     tonsillectomy     WOUND EXPLORATION Left 09/06/2018   Procedure: EXPLORATION OFCOMPLEX INJURY;  Surgeon: Charlotte Crumb, MD;  Location: [place];  Service: Orthopedics;  Laterality: Left;   Family History:  Family History  Problem Relation Age of Onset   High blood pressure Mother    Dementia Mother    Diabetes Father    Family Psychiatric  History: see initial psychiatry note Social History:  Social History   Substance and Sexual Activity  Alcohol Use No     Social History   Substance and Sexual Activity  Drug Use No    Social History   Socioeconomic History   Marital status: Married    Spouse name: [name]   Number of children: 0   Years of education: 12   Highest education level: Not on file  Occupational History   Occupation: retired    Fish farm manager: RETIRED  Tobacco Use   Smoking status: Never   Smokeless tobacco: Never  Vaping Use   Vaping Use: Never used  Substance and Sexual Activity   Alcohol use: No   Drug use: No   Sexual activity: Not on file  Other Topics Concern   Not on file  Social History Narrative   Patient lives at home with [name]). Patient is disabled. Patient has 12 th grade education.   Right handed.   Caffeine- None   Social Determinants of Health   Financial Resource Strain: Low Risk  (07/24/2022)   Overall Financial Resource Strain (CARDIA)    Difficulty of Paying Living Expenses: Not hard at all  Food Insecurity: No Food Insecurity (07/24/2022)   Hunger Vital Sign    Worried About Running Out of Food in the Last Year: Never true    Ran Out of Food in the Last Year: Never true  Transportation Needs: No Transportation Needs (07/24/2022)   PRAPARE - Hydrologist (Medical): No    Lack of  Transportation (Non-Medical): No  Physical Activity: Inactive (07/24/2022)   Exercise Vital Sign    Days of Exercise per Week: 0 days    Minutes of Exercise per Session: 0 min  Stress: No Stress Concern Present (07/24/2022)   [place]    Feeling of Stress : Not at all  Social Connections: Moderately Isolated (07/24/2022)   Social Connection and Isolation Panel [NHANES]    Frequency of Communication with Friends and Family: More than three times a week  Frequency of Social Gatherings with Friends and Family: More than three times a week    Attends Religious Services: Never    Marine scientist or Organizations: No    Attends Music therapist: Never    Marital Status: Married    Sleep: Good  Appetite:  Good  Current Medications: Current Facility-Administered Medications  Medication Dose Route Frequency Provider Last Rate Last Admin   aspirin EC tablet 81 mg  81 mg Oral Daily Larene Pickett, PA-C   81 mg at 09/17/22 0931   baclofen (LIORESAL) tablet 40 mg  40 mg Oral QID Larene Pickett, PA-C   40 mg at 09/17/22 2229   calcium-vitamin D (OSCAL WITH D) 500-5 MG-MCG per tablet 1 tablet  1 tablet Oral QAC breakfast Larene Pickett, PA-C   1 tablet at 09/17/22 0931   cholecalciferol (VITAMIN D3) 25 MCG (1000 UNIT) tablet 1,000 Units  1,000 Units Oral Daily Larene Pickett, PA-C   1,000 Units at 09/17/22 0931   citalopram (CELEXA) tablet 10 mg  10 mg Oral Daily Larene Pickett, PA-C   10 mg at 09/17/22 0931   clonazePAM (KLONOPIN) tablet 2 mg  2 mg Oral QHS Charmaine Downs C, NP       divalproex (DEPAKOTE ER) 24 hr tablet 500 mg  500 mg Oral QHS Keishla Oyer C, NP   500 mg at 09/16/22 2139   losartan (COZAAR) tablet 50 mg  50 mg Oral BID Larene Pickett, PA-C   50 mg at 09/17/22 7989   multivitamin with minerals tablet 1 tablet  1 tablet Oral Daily Larene Pickett, PA-C   1 tablet at 09/17/22 0931    oxybutynin (DITROPAN) tablet 5 mg  5 mg Oral QID Larene Pickett, PA-C   5 mg at 09/17/22 0931   tiZANidine (ZANAFLEX) tablet 4 mg  4 mg Oral Q6H PRN Larene Pickett, PA-C       Current Outpatient Medications  Medication Sig Dispense Refill   aspirin 81 MG EC tablet Take 81 mg by mouth daily.     baclofen (LIORESAL) 20 MG tablet TAKE 1 TABLET(20 MG) BY MOUTH FOUR TIMES DAILY (Patient taking differently: Take 20 mg by mouth 4 (four) times daily.) 360 tablet 4   calcium citrate-vitamin D 500-500 MG-UNIT chewable tablet Chew 1 tablet by mouth daily.     cholecalciferol (VITAMIN D3) 25 MCG (1000 UT) tablet Take 1,000 Units by mouth daily.     clonazePAM (KLONOPIN) 2 MG tablet Take 2 tablets (4 mg total) by mouth at bedtime. 16 tablet 0   losartan (COZAAR) 50 MG tablet TAKE 1 TABLET(50 MG) BY MOUTH TWICE DAILY (Patient taking differently: Take 50 mg by mouth 2 (two) times daily. TAKE 1 TABLET(50 MG) BY MOUTH TWICE DAILY) 180 tablet 3   Multiple Vitamin (MULTIVITAMIN) capsule Take 1 capsule by mouth daily.     Multiple Vitamins-Minerals (ZINC PO) Take by mouth.     oxybutynin (DITROPAN) 5 MG tablet Take 5 mg by mouth 4 (four) times daily.     tiZANidine (ZANAFLEX) 4 MG tablet TAKE 1-2 TABLETS BY MOUTH FOUR TIMES DAILY (Patient taking differently: Take 4 mg by mouth in the morning, at noon, in the evening, and at bedtime. 1-2 tablets po four times daily) 720 tablet 2   vitamin C (ASCORBIC ACID) 500 MG tablet Take 500 mg by mouth daily.     citalopram (CELEXA) 10 MG tablet Take 1 tablet (10 mg  total) by mouth daily. 90 tablet 3    Lab Results:  Results for orders placed or performed during the hospital encounter of 09/15/22 (from the past 48 hour(s))  Rapid urine drug screen (hospital performed)     Status: Abnormal   Collection Time: 09/15/22 12:23 PM  Result Value Ref Range   Opiates NONE DETECTED NONE DETECTED   Cocaine NONE DETECTED NONE DETECTED   Benzodiazepines POSITIVE (A) NONE DETECTED    Amphetamines NONE DETECTED NONE DETECTED   Tetrahydrocannabinol NONE DETECTED NONE DETECTED   Barbiturates NONE DETECTED NONE DETECTED    Comment: (NOTE) DRUG SCREEN FOR MEDICAL PURPOSES ONLY.  IF CONFIRMATION IS NEEDED FOR ANY PURPOSE, NOTIFY LAB WITHIN 5 DAYS.  LOWEST DETECTABLE LIMITS FOR URINE DRUG SCREEN Drug Class                     Cutoff (ng/mL) Amphetamine and metabolites    1000 Barbiturate and metabolites    200 Benzodiazepine                 200 Opiates and metabolites        300 Cocaine and metabolites        300 THC                            50 Performed at [facility], [place] [address] [place], [place] [address]   Urinalysis, Routine w reflex microscopic Urine, Clean Catch     Status: Abnormal   Collection Time: 09/15/22 12:23 PM  Result Value Ref Range   Color, Urine YELLOW YELLOW   APPearance HAZY (A) CLEAR   Specific Gravity, Urine 1.024 1.005 - 1.030   pH 5.0 5.0 - 8.0   Glucose, UA 50 (A) NEGATIVE mg/dL   Hgb urine dipstick NEGATIVE NEGATIVE   Bilirubin Urine NEGATIVE NEGATIVE   Ketones, ur 80 (A) NEGATIVE mg/dL   Protein, ur NEGATIVE NEGATIVE mg/dL   Nitrite NEGATIVE NEGATIVE   Leukocytes,Ua NEGATIVE NEGATIVE    Comment: Performed at [place] [address], [place], [place] [address]  CBC with Differential     Status: Abnormal   Collection Time: 09/15/22 11:52 PM  Result Value Ref Range   WBC 11.1 (H) 4.0 - 10.5 K/uL   RBC 5.60 4.22 - 5.81 MIL/uL   Hemoglobin 16.5 13.0 - 17.0 g/dL   HCT 51.8 39.0 - 52.0 %   MCV 92.5 80.0 - 100.0 fL   MCH 29.5 26.0 - 34.0 pg   MCHC 31.9 30.0 - 36.0 g/dL   RDW 13.4 11.5 - 15.5 %   Platelets 233 150 - 400 K/uL   nRBC 0.0 0.0 - 0.2 %   Neutrophils Relative % 76 %   Neutro Abs 8.4 (H) 1.7 - 7.7 K/uL   Lymphocytes Relative 11 %   Lymphs Abs 1.2 0.7 - 4.0 K/uL   Monocytes Relative 7 %   Monocytes Absolute 0.8 0.1 - 1.0 K/uL   Eosinophils Relative 4 %   Eosinophils  Absolute 0.5 0.0 - 0.5 K/uL   Basophils Relative 1 %   Basophils Absolute 0.1 0.0 - 0.1 K/uL   Immature Granulocytes 1 %   Abs Immature Granulocytes 0.05 0.00 - 0.07 K/uL    Comment: Performed at [facility], [place] [address], [place], [place] [address]  Comprehensive metabolic panel     Status: Abnormal   Collection Time: 09/15/22 11:52 PM  Result Value Ref Range   Sodium 141 135 - 145 mmol/L   Potassium 3.3 (L) 3.5 - 5.1 mmol/L   Chloride 109 98 - 111 mmol/L   CO2 25 22 - 32 mmol/L   Glucose, Bld 126 (H) 70 - 99 mg/dL    Comment: Glucose reference range applies only to samples taken after fasting for at least 8 hours.   BUN 22 8 - 23 mg/dL   Creatinine, Ser 0.71 0.61 - 1.24 mg/dL   Calcium 8.8 (L) 8.9 - 10.3 mg/dL   Total Protein 7.4 6.5 - 8.1 g/dL   Albumin 4.5 3.5 - 5.0 g/dL   AST 31 15 - 41 U/L   ALT 31 0 - 44 U/L   Alkaline Phosphatase 69 38 - 126 U/L   Total Bilirubin 1.2 0.3 - 1.2 mg/dL   GFR, Estimated >60 >60 mL/min    Comment: (NOTE) Calculated using the CKD-EPI Creatinine Equation (2021)    Anion gap 7 5 - 15    Comment: Performed at [facility], [place] [address], [place], [place] [address]  Ethanol     Status: None   Collection Time: 09/15/22 11:52 PM  Result Value Ref Range   Alcohol, Ethyl (B) <10 <10 mg/dL    Comment: (NOTE) Lowest detectable limit for serum alcohol is 10 mg/dL.  For medical purposes only. Performed at [facility], [place] [address] [place], [place] [address]   Salicylate level     Status: Abnormal   Collection Time: 09/15/22 11:52 PM  Result Value Ref Range   Salicylate Lvl <5.7 (L) 7.0 - 30.0 mg/dL    Comment: Performed at [facility], [place] [address], [place], [place] [address]  Acetaminophen level     Status: Abnormal   Collection Time: 09/15/22 11:52 PM  Result Value Ref Range   Acetaminophen (Tylenol), Serum <10 (L) 10 - 30 ug/mL    Comment:  (NOTE) Therapeutic concentrations vary significantly. A range of 10-30 ug/mL  may be an effective concentration for many patients. However, some  are best treated at concentrations outside of this range. Acetaminophen concentrations >150 ug/mL at 4 hours after ingestion  and >50 ug/mL at 12 hours after ingestion are often associated with  toxic reactions.  Performed at [facility], [place] [address], [place], [place] [address]   Resp Panel by RT-PCR (Flu A&B, Covid) Anterior Nasal Swab     Status: None   Collection Time: 09/15/22 11:52 PM   Specimen: Anterior Nasal Swab  Result Value Ref Range   SARS Coronavirus 2 by RT PCR NEGATIVE NEGATIVE    Comment: (NOTE) SARS-CoV-2 target nucleic acids are NOT DETECTED.  The SARS-CoV-2 RNA is generally detectable in upper respiratory specimens during the acute phase of infection. The lowest concentration of SARS-CoV-2 viral copies this assay can detect is 138 copies/mL. A negative result does not preclude SARS-Cov-2 infection and should not be used as the sole basis for treatment or other patient management decisions. A negative result may occur with  improper specimen collection/handling, submission of specimen other than nasopharyngeal swab, presence of viral mutation(s) within the areas targeted by this assay, and inadequate number of viral copies(<138 copies/mL). A negative result must be combined with clinical observations, patient history, and epidemiological information. The expected result is Negative.  Fact Sheet for Patients:  EntrepreneurPulse.com.au  Fact Sheet for Healthcare Providers:  IncredibleEmployment.be  This test is no t yet approved or cleared by the [place]  FDA and  has been authorized for detection and/or diagnosis of SARS-CoV-2 by FDA under an Emergency Use Authorization (EUA). This EUA will remain  in effect (meaning this test can be used) for the  duration of the COVID-19 declaration under Section 564(b)(1) of the Act, 21 U.S.C.section 360bbb-3(b)(1), unless the authorization is terminated  or revoked sooner.       Influenza A by PCR NEGATIVE NEGATIVE   Influenza B by PCR NEGATIVE NEGATIVE    Comment: (NOTE) The Xpert Xpress SARS-CoV-2/FLU/RSV plus assay is intended as an aid in the diagnosis of influenza from Nasopharyngeal swab specimens and should not be used as a sole basis for treatment. Nasal washings and aspirates are unacceptable for Xpert Xpress SARS-CoV-2/FLU/RSV testing.  Fact Sheet for Patients: EntrepreneurPulse.com.au  Fact Sheet for Healthcare Providers: IncredibleEmployment.be  This test is not yet approved or cleared by the [place] FDA and has been authorized for detection and/or diagnosis of SARS-CoV-2 by FDA under an Emergency Use Authorization (EUA). This EUA will remain in effect (meaning this test can be used) for the duration of the COVID-19 declaration under Section 564(b)(1) of the Act, 21 U.S.C. section 360bbb-3(b)(1), unless the authorization is terminated or revoked.  Performed at [facility], [place] [address] [place], [place] [address]     Blood Alcohol level:  Lab Results  Component Value Date   [facility] <10 09/15/2022   ETH <10 09/15/2019    Physical Findings:  CIWA:    COWS:     Musculoskeletal: Strength & Muscle Tone:  paraplegic, in bed  Gait & Station:  paraplegic seen in bed Patient leans:  see above  Psychiatric Specialty Exam:  Presentation  General Appearance:  Casual  Eye Contact: Minimal  Speech: Clear and Coherent; Normal Rate  Speech Volume: Normal  Handedness: Right   Mood and Affect  Mood: Depressed  Affect: Congruent   Thought Process  Thought Processes: Disorganized; Irrevelant  Descriptions of Associations:Tangential  Orientation:Partial  Thought Content:Logical;  Illogical  History of Schizophrenia/Schizoaffective disorder:No  Duration of Psychotic Symptoms:No data recorded Hallucinations:Hallucinations: None  Ideas of Reference:None  Suicidal Thoughts:Suicidal Thoughts: No SI Passive Intent and/or Plan: With Intent; With Plan; Without Access to Means; Without Means to Carry Out  Homicidal Thoughts:Homicidal Thoughts: No   Sensorium  Memory: Immediate Good; Recent Good; Remote Fair  Judgment: Impaired  Insight: Lacking   Executive Functions  Concentration: Fair  Attention Span: Fair  Recall: [place] of Knowledge: Good  Language: Good   Psychomotor Activity  Psychomotor Activity: Psychomotor Activity: Normal   Assets  Assets: Communication Skills; Housing; Desire for Improvement; Financial Resources/Insurance   Sleep  Sleep: Sleep: Fair    Physical Exam: Physical Exam ROS Blood pressure 136/86, pulse (!) 105, temperature 98.1 F (36.7 C), temperature source Oral, resp. rate 18, SpO2 94 %. There is no height or weight on file to calculate BMI.   Medical Decision Making: Patient continues to require inpatient Psychiatry treatment for depression and anxiety.  He is on Celexa and klonopin for anxiety.  Klonopin is being decreased from 4 mg to 2 mg at night time.  We will consult SW to evaluate for SNF placement as patient cannot care for himself and lost his wife two weeks ago.  Problem 1: Single Episode Major Depressive disorder, severe Psychotic features   Problem 2: Adjustment disorder with mixed anxiety/Depression.   Admit, seek bed placement. Delfin Gant, NP-PMHNP-BC 09/17/2022, 11:55 AM

## 2022-09-17 NOTE — Progress Notes (Signed)
Per Charmaine Downs, NP, patient meets criteria for inpatient treatment. There are no available beds at [facility] today. CSW faxed referrals to the following facilities for review:  [address] [place] [address] [phone] --  [facility]  Pending - Request Sent N/A [address] [place] [address] [phone] --  [facility]  Pending - Request Sent N/A [place], [place] [address] [phone] --  [place] N/A [address] [place], [place] [address] [phone] --  [place] [address] [place] [address] [phone] --  [place] [address] [place] [address] [phone] --  [place] N/A [address], [place] [address] [phone] --  [place] N/A [address] [place] [address] [phone] --  [place], [place] [address] [phone] --   TTS will continue to seek bed placement.  [place], MSW, [place] Phone: [phone] Disposition/TOC

## 2022-09-17 NOTE — Evaluation (Signed)
Physical Therapy Evaluation Patient Details Name: David Kim MRN: [id number] DOB: 1946/05/23 Today's Date: 09/17/2022  History of Present Illness  76 y.o. male patient admitted with anxiety and recent diagnosis of Depression after his wife died two weeks ago. Patient presented to the ED under IVC taken out by his brother in law. He has been depressed since wife passed on and expressed wanting to die.  PMH: Paraplegia since ~ 2010, HTN, ORIF L long finger, prostate CA  Clinical Impression  Pt admitted with above diagnosis.  Pt is cooperative with PT, generally disheveled appearance, asking to go home and to have his phone.  Pt reports to PT he will be "hiring people" to help him. [name] tells PT that his wife helped him quite a bit at home an did household tasks.  He was independent from a w/c level at baseline. Currently much weaker than his reported baseline.   Recommend SNF, continue to follow in acute setting/TCU.  Pt currently with functional limitations due to the deficits listed below (see PT Problem List). Pt will benefit from skilled PT to increase their independence and safety with mobility to allow discharge to the venue listed below.          Recommendations for follow up therapy are one component of a multi-disciplinary discharge planning process, led by the attending physician.  Recommendations may be updated based on patient status, additional functional criteria and insurance authorization.  Follow Up Recommendations Skilled nursing-short term rehab (<3 hours/day) Can patient physically be transported by private vehicle: No    Assistance Recommended at Discharge Frequent or constant Supervision/Assistance  Patient can return home with the following  Assist for transportation;Help with stairs or ramp for entrance    Equipment Recommendations None recommended by PT  Recommendations for Other Services       Functional Status Assessment Patient has had a recent decline in  their functional status and demonstrates the ability to make significant improvements in function in a reasonable and predictable amount of time.     Precautions / Restrictions Precautions Precautions: Fall Restrictions Weight Bearing Restrictions: No      Mobility  Bed Mobility Overal bed mobility: Needs Assistance Bed Mobility: Supine to Sit, Sit to Supine     Supine to sit: Min assist, Mod assist Sit to supine: Min assist, Mod assist   General bed mobility comments: assist with LEs on and off bed. pt is able to scoot up in supine with supervision using head board/UEs    Transfers                   General transfer comment: deferred in current TCU environment; lateral scooting along EOB with +2 assist    Ambulation/Gait                  Stairs            Wheelchair Mobility    Modified Rankin (Stroke Patients Only)       Balance Overall balance assessment: Needs assistance Sitting-balance support: Feet supported, Bilateral upper extremity supported Sitting balance-Leahy Scale: Fair Sitting balance - Comments: pt is able to laterally wt shift R/L but requires close supervision d/t uncontrolled anterior LOB intermittently Postural control: Other (comment) (anterior LOB)     Standing balance comment: unable at baseline                             Pertinent Vitals/Pain Pain Assessment  Pain Assessment: No/denies pain    Home Living Family/patient expects to be discharged to:: Unsure Living Arrangements: Alone   Type of Home: House Home Access: Ramped entrance       Home Layout: One level Home Equipment: Wheelchair - manual;Other (comment);Hospital bed      Prior Function Prior Level of Function : Independent/Modified Independent             Mobility Comments: pt reports independence with sliding board transfers and manual w/c is his primary mode of locomotion       Hand Dominance        Extremity/Trunk  Assessment   Upper Extremity Assessment Upper Extremity Assessment: Defer to OT evaluation    Lower Extremity Assessment Lower Extremity Assessment: RLE deficits/detail;LLE deficits/detail RLE Deficits / Details: paraplegic ?exact level, bil LEs absent sensation. 0/5. PROM grossly WFL LLE Deficits / Details: paraplegic ?exact level, bil LEs absent sensation. 0/5. PROM grossly WFL except knee flexion contracture ~ 15 degrees       Communication   Communication: No difficulties  Cognition Arousal/Alertness: Awake/alert Behavior During Therapy: Flat affect Overall Cognitive Status: Within Functional Limits for tasks assessed                                          General Comments      Exercises     Assessment/Plan    PT Assessment Patient needs continued PT services  PT Problem List Decreased strength;Decreased activity tolerance;Decreased balance;Decreased mobility       PT Treatment Interventions DME instruction;Balance training;Functional mobility training;Therapeutic activities;Therapeutic exercise;Patient/family education    PT Goals (Current goals can be found in the Care Plan section)  Acute Rehab PT Goals Patient Stated Goal: wants to go home PT Goal Formulation: With patient Time For Goal Achievement: 09/24/22 Potential to Achieve Goals: Good    Frequency Min 2X/week     Co-evaluation               AM-PAC PT "6 Clicks" Mobility  Outcome Measure Help needed turning from your back to your side while in a flat bed without using bedrails?: A Little Help needed moving from lying on your back to sitting on the side of a flat bed without using bedrails?: A Little Help needed moving to and from a bed to a chair (including a wheelchair)?: A Lot Help needed standing up from a chair using your arms (e.g., wheelchair or bedside chair)?: Total Help needed to walk in hospital room?: Total Help needed climbing 3-5 steps with a railing? : Total 6  Click Score: 11    End of Session   Activity Tolerance: Patient tolerated treatment well Patient left: in bed;with call bell/phone within reach;with bed alarm set   PT Visit Diagnosis: Other abnormalities of gait and mobility (R26.89)    Time: 1140-1153 PT Time Calculation (min) (ACUTE ONLY): 13 min   Charges:   PT Evaluation $PT Eval Low Complexity: [place], PT  Acute Rehab Dept [facility]) [phone]  [facility] Weekend Pager (Saturday/Sunday only)  [phone]  09/17/2022   [facility] 09/17/2022, 1:53 PM

## 2022-09-18 ENCOUNTER — Emergency Department (HOSPITAL_COMMUNITY): Payer: Medicare Other

## 2022-09-18 DIAGNOSIS — R000 Tachycardia, unspecified: Secondary | ICD-10-CM | POA: Diagnosis not present

## 2022-09-18 LAB — URINALYSIS, ROUTINE W REFLEX MICROSCOPIC
Bilirubin Urine: NEGATIVE
Glucose, UA: NEGATIVE mg/dL
Hgb urine dipstick: NEGATIVE
Ketones, ur: 5 mg/dL — AB
Leukocytes,Ua: NEGATIVE
Nitrite: NEGATIVE
Protein, ur: NEGATIVE mg/dL
Specific Gravity, Urine: 1.026 (ref 1.005–1.030)
pH: 5 (ref 5.0–8.0)

## 2022-09-18 LAB — BASIC METABOLIC PANEL
Anion gap: 9 (ref 5–15)
BUN: 24 mg/dL — ABNORMAL HIGH (ref 8–23)
CO2: 23 mmol/L (ref 22–32)
Calcium: 8.7 mg/dL — ABNORMAL LOW (ref 8.9–10.3)
Chloride: 105 mmol/L (ref 98–111)
Creatinine, Ser: 0.97 mg/dL (ref 0.61–1.24)
GFR, Estimated: >60 mL/min (ref >60)
Glucose, Bld: 146 mg/dL — ABNORMAL HIGH (ref 70–99)
Potassium: 3.6 mmol/L (ref 3.5–5.1)
Sodium: 137 mmol/L (ref 135–145)

## 2022-09-18 LAB — HEMOGLOBIN AND HEMATOCRIT, BLOOD
HCT: 51.4 % (ref 39.0–52.0)
Hemoglobin: 16.8 g/dL (ref 13.0–17.0)

## 2022-09-18 MED ORDER — DIVALPROEX SODIUM ER 500 MG PO TB24: 500.0000 mg | ORAL_TABLET | Freq: Every day | ORAL | 0 refills | Status: DC

## 2022-09-18 MED ORDER — POTASSIUM CHLORIDE CRYS ER 20 MEQ PO TBCR
20.0000 meq | EXTENDED_RELEASE_TABLET | Freq: Once | ORAL | Status: AC
  Administered 2022-09-18: 20 meq via ORAL
  Filled 2022-09-18: qty 1

## 2022-09-18 MED ORDER — FUROSEMIDE 20 MG PO TABS
20.0000 mg | ORAL_TABLET | Freq: Once | ORAL | Status: AC
  Administered 2022-09-18: 20 mg via ORAL
  Filled 2022-09-18: qty 1

## 2022-09-18 MED ORDER — CLONAZEPAM 2 MG PO TABS: 2.0000 mg | ORAL_TABLET | Freq: Every day | ORAL | 0 refills | Status: DC

## 2022-09-18 NOTE — ED Notes (Signed)
Bladder Scan  - 231 cc

## 2022-09-18 NOTE — Discharge Summary (Signed)
[facility] Psych ED Discharge  09/18/2022 1:18 PM David Kim  MRN:  [id number]  Principal Problem: Severe major depression, single episode, without psychotic features [facility]) Discharge Diagnoses: Principal Problem:   Severe major depression, single episode, without psychotic features ([place]) Active Problems:   Persistent adjustment disorder with mixed anxiety and depressed mood  Clinical Impression:  Final diagnoses:  Involuntary commitment   Subjective:  David Kim is a 76 y.o. male patient admitted with previous hx of Paraplegia, anxiety and recent diagnosis of Depression after his wife died two weeks ago.  Patient presented to the ER under IVC taken out by his brother in law.  He has been depressed since wife passed on and expressed wanting to die.  Patient denies SI/HI/AVH and no mention of paranoia.  Patient is fixated on religion and healing and prays silently alone in the room.  Patient want to go home and pay his bills.  Patient is worried about being late paying his bills.  Patient reports that his depression is easing down since starting medications.  He is interested in home healthcare staff coming to his house to help him.  He is not interested in SNF  care.   Patient is alert and oriented x4 and again denies feeling suicidal.  Patient is here voluntarily and  is Psychiatrically cleared.  TOC in for assistance with home health staff to go to his home and help him.  His brother in law and niece says they will not be able to help him as much.  Patient is Psychiatrically cleared to go home.  We discussed the need to cut down on Klonopin and to continue 2 mg dose offered in the hospital.  Patient is adamant that he has been taking 4 mg every night for 15 years.  We discussed the side effect of this medication and the dose. We discussed the need to follow this instruction of 2 mg only at night.  We discussed safety plan-call 911, go to the nearest ER for Mental health Crisis.  Patient is  Psychiatrically cleared.  ED Assessment Time Calculation: Start Time: 3428 Stop Time: 7681 Total Time in Minutes (Assessment Completion): 14   Past Psychiatric History: see previous note  Past Medical History:  Past Medical History:  Diagnosis Date   Allergic rhinitis    ALLERGIC RHINITIS 10/01/2007   Qualifier: Diagnosis of  By: Jenny Reichmann MD, Hunt Oris    Anxiety    Carpal tunnel syndrome    right   Depression    Depression    Erectile dysfunction    Fatigue    GERD (gastroesophageal reflux disease)    Gout    Hypertension    HYPOGONADISM 03/31/2008   Qualifier: Diagnosis of  By: Jenny Reichmann MD, Hunt Oris    IBS (irritable bowel syndrome)    Low back pain    OSA (obstructive sleep apnea)    Paraplegia ([place]) 04/23/2009   Qualifier: Diagnosis of  By: Jenny Reichmann MD, Hunt Oris    Peptic ulcer disease    PEPTIC ULCER DISEASE 10/01/2007   Qualifier: Diagnosis of  By: Jenny Reichmann MD, Hunt Oris    Prostate cancer [facility])    PROSTATE CANCER, HX OF 05/23/2007   Qualifier: Diagnosis of  By: Jenny Reichmann MD, [place] PARALYSIS 04/23/2009   Qualifier: Diagnosis of  By: Jenny Reichmann MD, Hunt Oris    Thoracic spinal cord injury [facility]) 03/12/2012    Past Surgical History:  Procedure Laterality Date   ARTERY AND TENDON  REPAIR Left 09/06/2018   Procedure: ARTERY AND TENDON REPAIR;  Surgeon: Charlotte Crumb, MD;  Location: [place];  Service: Orthopedics;  Laterality: Left;   CAST APPLICATION Left 41/07/3789   Procedure: CAST APPLICATION;  Surgeon: Charlotte Crumb, MD;  Location: [place];  Service: Orthopedics;  Laterality: Left;   inguinal heniorrhaphy     left leg surgery after fibula     NERVE REPAIR Left 09/06/2018   Procedure: NERVE REPAIR TIMES TWO;  Surgeon: Charlotte Crumb, MD;  Location: [place];  Service: Orthopedics;  Laterality: Left;   OPEN REDUCTION INTERNAL FIXATION (ORIF) DISTAL PHALANX Left 09/06/2018   Procedure: OPEN REDUCTION INTERNAL FIXATION (ORIF) LEFT LONG FINGER;  Surgeon: Charlotte Crumb, MD;  Location: [place];  Service: Orthopedics;  Laterality: Left;   PILONIDAL CYST / SINUS EXCISION     PROSTATECTOMY     tonsillectomy     WOUND EXPLORATION Left 09/06/2018   Procedure: EXPLORATION OFCOMPLEX INJURY;  Surgeon: Charlotte Crumb, MD;  Location: [place];  Service: Orthopedics;  Laterality: Left;   Family History:  Family History  Problem Relation Age of Onset   High blood pressure Mother    Dementia Mother    Diabetes Father    Family Psychiatric  History: see previous note Social History:  Social History   Substance and Sexual Activity  Alcohol Use No     Social History   Substance and Sexual Activity  Drug Use No    Social History   Socioeconomic History   Marital status: Married    Spouse name: [name]   Number of children: 0   Years of education: 12   Highest education level: Not on file  Occupational History   Occupation: retired    Fish farm manager: RETIRED  Tobacco Use   Smoking status: Never   Smokeless tobacco: Never  Vaping Use   Vaping Use: Never used  Substance and Sexual Activity   Alcohol use: No   Drug use: No   Sexual activity: Not on file  Other Topics Concern   Not on file  Social History Narrative   Patient lives at home with [name]). Patient is disabled. Patient has 12 th grade education.   Right handed.   Caffeine- None   Social Determinants of Health   Financial Resource Strain: Low Risk  (07/24/2022)   Overall Financial Resource Strain (CARDIA)    Difficulty of Paying Living Expenses: Not hard at all  Food Insecurity: No Food Insecurity (07/24/2022)   Hunger Vital Sign    Worried About Running Out of Food in the Last Year: Never true    Ran Out of Food in the Last Year: Never true  Transportation Needs: No Transportation Needs (07/24/2022)   PRAPARE - Hydrologist (Medical): No    Lack of Transportation (Non-Medical): No  Physical Activity: Inactive (07/24/2022)   Exercise Vital Sign    Days of Exercise per  Week: 0 days    Minutes of Exercise per Session: 0 min  Stress: No Stress Concern Present (07/24/2022)   [place]    Feeling of Stress : Not at all  Social Connections: Moderately Isolated (07/24/2022)   Social Connection and Isolation Panel [NHANES]    Frequency of Communication with Friends and Family: More than three times a week    Frequency of Social Gatherings with Friends and Family: More than three times a week    Attends Religious Services: Never  Active Member of Clubs or Organizations: No    Attends Archivist Meetings: Never    Marital Status: Married    Tobacco Cessation:  N/A, patient does not currently use tobacco products  Current Medications: Current Facility-Administered Medications  Medication Dose Route Frequency Provider Last Rate Last Admin   aspirin EC tablet 81 mg  81 mg Oral Daily Larene Pickett, PA-C   81 mg at 09/18/22 5859   baclofen (LIORESAL) tablet 40 mg  40 mg Oral QID Larene Pickett, PA-C   40 mg at 09/18/22 2924   calcium-vitamin D (OSCAL WITH D) 500-5 MG-MCG per tablet 1 tablet  1 tablet Oral QAC breakfast Larene Pickett, PA-C   1 tablet at 09/18/22 4628   cholecalciferol (VITAMIN D3) 25 MCG (1000 UNIT) tablet 1,000 Units  1,000 Units Oral Daily Larene Pickett, PA-C   1,000 Units at 09/18/22 6381   citalopram (CELEXA) tablet 10 mg  10 mg Oral Daily Larene Pickett, PA-C   10 mg at 09/18/22 7711   clonazePAM (KLONOPIN) tablet 2 mg  2 mg Oral QHS Alleyah Twombly C, NP   2 mg at 09/17/22 2020   divalproex (DEPAKOTE ER) 24 hr tablet 500 mg  500 mg Oral QHS Shelda Truby C, NP   500 mg at 09/17/22 2021   losartan (COZAAR) tablet 50 mg  50 mg Oral BID Larene Pickett, PA-C   50 mg at 09/18/22 6579   multivitamin with minerals tablet 1 tablet  1 tablet Oral Daily Larene Pickett, PA-C   1 tablet at 09/18/22 0383   oxybutynin (DITROPAN) tablet 5 mg  5 mg Oral QID Larene Pickett, PA-C   5 mg at 09/18/22 3383   tiZANidine (ZANAFLEX) tablet 4 mg  4 mg Oral Q6H PRN Larene Pickett, PA-C       Current Outpatient Medications  Medication Sig Dispense Refill   aspirin 81 MG EC tablet Take 81 mg by mouth daily.     baclofen (LIORESAL) 20 MG tablet TAKE 1 TABLET(20 MG) BY MOUTH FOUR TIMES DAILY (Patient taking differently: Take 20 mg by mouth 4 (four) times daily.) 360 tablet 4   calcium citrate-vitamin D 500-500 MG-UNIT chewable tablet Chew 1 tablet by mouth daily.     cholecalciferol (VITAMIN D3) 25 MCG (1000 UT) tablet Take 1,000 Units by mouth daily.     clonazePAM (KLONOPIN) 2 MG tablet Take 2 tablets (4 mg total) by mouth at bedtime. 16 tablet 0   losartan (COZAAR) 50 MG tablet TAKE 1 TABLET(50 MG) BY MOUTH TWICE DAILY (Patient taking differently: Take 50 mg by mouth 2 (two) times daily. TAKE 1 TABLET(50 MG) BY MOUTH TWICE DAILY) 180 tablet 3   Multiple Vitamin (MULTIVITAMIN) capsule Take 1 capsule by mouth daily.     Multiple Vitamins-Minerals (ZINC PO) Take by mouth.     oxybutynin (DITROPAN) 5 MG tablet Take 5 mg by mouth 4 (four) times daily.     tiZANidine (ZANAFLEX) 4 MG tablet TAKE 1-2 TABLETS BY MOUTH FOUR TIMES DAILY (Patient taking differently: Take 4 mg by mouth in the morning, at noon, in the evening, and at bedtime. 1-2 tablets po four times daily) 720 tablet 2   vitamin C (ASCORBIC ACID) 500 MG tablet Take 500 mg by mouth daily.     citalopram (CELEXA) 10 MG tablet Take 1 tablet (10 mg total) by mouth daily. 90 tablet 3   PTA Medications: (Not in a hospital admission)  [place] Scale:  Flowsheet Row ED from 09/15/2022 in [facility] DEPT  C-SSRS RISK CATEGORY High Risk       Musculoskeletal: Strength & Muscle Tone:  paraplegic patient Gait & Station:  paraplegic patient Patient leans:  see above  Psychiatric Specialty Exam: Presentation  General Appearance:  Casual; Neat  Eye  Contact: Good  Speech: Clear and Coherent; Normal Rate  Speech Volume: Normal  Handedness: Right   Mood and Affect  Mood: Depressed  Affect: Congruent   Thought Process  Thought Processes: Coherent; Goal Directed; Linear  Descriptions of Associations:Intact  Orientation:Full (Time, Place and Person)  Thought Content:Logical  History of Schizophrenia/Schizoaffective disorder:No  Duration of Psychotic Symptoms:No data recorded Hallucinations:Hallucinations: None  Ideas of Reference:None  Suicidal Thoughts:Suicidal Thoughts: No  Homicidal Thoughts:Homicidal Thoughts: No   Sensorium  Memory: Immediate Good; Recent Good; Remote Fair  Judgment: Intact  Insight: Fair   Community education officer  Concentration: Good  Attention Span: Good  Recall: Good  Fund of Knowledge: Fair  Language: Good   Psychomotor Activity  Psychomotor Activity: Psychomotor Activity: Normal   Assets  Assets: Communication Skills; Housing; Social Support; Financial Resources/Insurance   Sleep  Sleep: Sleep: Good    Physical Exam: Physical Exam Constitutional:      Appearance: Normal appearance.  HENT:     Head: Normocephalic and atraumatic.     Nose: Nose normal.  Cardiovascular:     Rate and Rhythm: Tachycardia present.  Pulmonary:     Effort: Pulmonary effort is normal.  Musculoskeletal:     Cervical back: Normal range of motion.     Comments: Paraplegic, mobility issues, care issues.  Skin:    General: Skin is warm and dry.  Neurological:     Mental Status: He is alert and oriented to person, place, and time.    Review of Systems  Constitutional: Negative.   HENT: Negative.    Eyes: Negative.   Respiratory: Negative.    Cardiovascular: Negative.   Gastrointestinal: Negative.   Genitourinary: Negative.   Musculoskeletal:        Paraplegic  Skin: Negative.   Neurological: Negative.   Endo/Heme/Allergies: Negative.    Psychiatric/Behavioral:  Positive for depression. The patient is nervous/anxious.    Blood pressure (!) 140/81, pulse (!) 109, temperature 99.4 F (37.4 C), temperature source Oral, resp. rate 18, SpO2 94 %. There is no height or weight on file to calculate BMI.   Demographic Factors:  Male, Age 56 or older, Caucasian, Living alone, and fire arms are locked up  Loss Factors: Loss of significant relationship and loss of mobility-paraplegic  Historical Factors: NA  Risk Reduction Factors:   Religious beliefs about death and Positive social support  Continued Clinical Symptoms:  Depression:   Hopelessness Insomnia  Cognitive Features That Contribute To Risk:  None    Suicide Risk:  Minimal: No identifiable suicidal ideation.  Patients presenting with no risk factors but with morbid ruminations; may be classified as minimal risk based on the severity of the depressive symptoms    Plan Of Care/Follow-up recommendations:  Activity:  as tolerated Diet:  Regular  Medical Decision Making: Patient feels some improvement since starting Celexa for depression and Depakote for mood management.  He remains calm and cooperative but refuses to move to SNF at this time.  He is interested in home health staff coming to his home.  Patient denies SI/HI/AVH and no paranoia.  Problem 1: Single Episode Major Depressive disorder, severe Psychotic features  Problem 2: Adjustment disorder with mixed anxiety/Depression.   Disposition:   Psychiatrically cleared.  Delfin Gant, NP-PMHNP-BC 09/18/2022, 1:18 PM

## 2022-09-18 NOTE — Social Work (Signed)
CSW spoke to the patient he did not want to go to SNF due to him thinking he could not afford it. Patient does have the insurance to possible go to a SNF. CSW has asked provider to put in a PT order as we will send the Pt out to [facility]. Patient will need to go to a LTC as the brother in law will no longer help the patient.

## 2022-09-18 NOTE — ED Provider Notes (Addendum)
Emergency Medicine Observation Re-evaluation Note  David Kim is a 76 y.o. male, seen on rounds today.  Pt initially presented to the ED for complaints of IVC Currently, the patient is sleeping comfortably.  Physical Exam  BP (!) 114/91 (BP Location: Left Arm)   Pulse 99   Temp 99.4 F (37.4 C) (Oral)   Resp 14   SpO2 95%  Physical Exam General: Sleeping comfortably Cardiac: Regular rate  Lungs: Breathing comfortably Psych: Calm  ED Course / MDM  EKG:   I have reviewed the labs performed to date as well as medications administered while in observation.  Recent changes in the last 24 hours include no acute changes. Under IVC Plan  Current plan is for TTS working for placement.  Patient under IVC.  3:28 PM Reassessed patient who is resting comfortably in his bed with no complaints.  Denies chest pain shortness of breath or pain or anxiety.  He has no pain edema of his lower extremities.  He is afebrile.  I was called to his bedside due to tachycardia.  EKG sinus tachycardia.  No concerning ischemic change.  Repeat labs ordered 4.  Chest x-ray showed evidence of some interstitial infiltrates possibly suggestive of pulmonary edema.  Given patient 20 mg p.o. Lasix.  Repeat UA pending. Oncoming MD Will follow up labs.   Elgie Congo, MD 09/18/22 5681    Elgie Congo, MD 09/18/22 [phone]

## 2022-09-18 NOTE — Progress Notes (Addendum)
Chart has been screened. It appears this pt is still meeting inpatient criteria. TOC to reengage once pt is cleared from psych care.
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
## 2022-09-18 NOTE — ED Notes (Signed)
Patient alert.  Patient ate breakfast.  Patient asking about leaving.  RN reassured patient.

## 2022-09-18 NOTE — ED Notes (Signed)
Patient alert..  Patient medication compliant this AM.  Confusion noted.  Cooperative with staff
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
## 2022-09-19 NOTE — ED Notes (Signed)
Patient alert this shift.  Patient medication compliant. Patient confused.  No suicidal ideation or homicidal ideation noted.    Patient not ambulatory(parapolegic)  and needs assist with ADLs .
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
## 2022-09-19 NOTE — Progress Notes (Addendum)
[place] Note   Patient Details  Name: David Kim Date of Birth: [birth year]   Transition of Care [facility]) CM/SW Contact:    Kimber Relic, LCSW Phone Number: 09/19/2022, 1:45 PM  To Whom It May Concern:  Please be advised that this patient will require a short-term nursing home stay - anticipated 30 days or less for rehabilitation and strengthening.   The plan is for return home.

## 2022-09-19 NOTE — Progress Notes (Signed)
Physical Therapy Treatment Patient Details Name: David Kim MRN: [id number] DOB: [birth year] Today's Date: 09/19/2022   History of Present Illness 76 y.o. male patient admitted with anxiety and recent diagnosis of Depression after his wife died two weeks ago. Patient presented to the ED under IVC taken out by his brother in law. He has been depressed since wife passed on and expressed wanting to die.  PMH: Paraplegia since ~ 2010, HTN, ORIF L long finger, prostate CA    PT Comments    Patient  resting in bed. Patient able to mobilize to sitting and back to supine with min/mod assistance.  Patient stating that he has no support and needs to return home to pay his bills. Most family live out of town. Will attempt  WC transfers next visit and get   patient more mobile.   Recommendations for follow up therapy are one component of a multi-disciplinary discharge planning process, led by the attending physician.  Recommendations may be updated based on patient status, additional functional criteria and insurance authorization.  Follow Up Recommendations  Skilled nursing-short term rehab (<3 hours/day) Can patient physically be transported by private vehicle: No   Assistance Recommended at Discharge Frequent or constant Supervision/Assistance  Patient can return home with the following Assist for transportation;Help with stairs or ramp for entrance;A lot of help with walking and/or transfers;A little help with bathing/dressing/bathroom;Assistance with cooking/housework   Equipment Recommendations  None recommended by PT    Recommendations for Other Services       Precautions / Restrictions Precautions Precautions: Fall Precaution Comments: paraplegia     Mobility  Bed Mobility   Bed Mobility: Supine to Sit, Sit to Supine     Supine to sit: Min assist Sit to supine: Min assist   General bed mobility comments: patient moved legs to nearly over bed edge, mod assist for legs back  onto bed, slides up in bed with head board(pt. has a trapeze at home.)    Transfers                        Ambulation/Gait                   Stairs             Wheelchair Mobility    Modified Rankin (Stroke Patients Only)       Balance Overall balance assessment: Needs assistance Sitting-balance support: Feet supported, Bilateral upper extremity supported Sitting balance-Leahy Scale: Fair Sitting balance - Comments: pt is able to laterally wt shift R/L but requires close supervision, noted Left leg spasms, pulling trunk forward d/t uncontrolled anterior LOB intermittently                                    Cognition Arousal/Alertness: Awake/alert Behavior During Therapy: Flat affect                                   General Comments: appears quite down, expressing that he needs to get to his home and pay  bills, power will be  turned off.        Exercises      General Comments        Pertinent Vitals/Pain Pain Assessment Pain Assessment: No/denies pain    Home Living  Prior Function            PT Goals (current goals can now be found in the care plan section) Progress towards PT goals: Progressing toward goals    Frequency    Min 2X/week      PT Plan Current plan remains appropriate    Co-evaluation              AM-PAC PT "6 Clicks" Mobility   Outcome Measure  Help needed turning from your back to your side while in a flat bed without using bedrails?: A Little Help needed moving from lying on your back to sitting on the side of a flat bed without using bedrails?: A Little Help needed moving to and from a bed to a chair (including a wheelchair)?: A Lot Help needed standing up from a chair using your arms (e.g., wheelchair or bedside chair)?: Total Help needed to walk in hospital room?: Total Help needed climbing 3-5 steps with a railing? : Total 6  Click Score: 11    End of Session   Activity Tolerance: Patient tolerated treatment well Patient left: in bed;with call bell/phone within reach;with family/visitor present Nurse Communication: Mobility status PT Visit Diagnosis: Other abnormalities of gait and mobility (R26.89);Other symptoms and signs involving the nervous system (R29.898)     Time: 6168-3729 PT Time Calculation (min) (ACUTE ONLY): 19 min  Charges:  $Therapeutic Activity: 8-22 mins                     [place] Office [phone] Weekend [phone]    Claretha Cooper 09/19/2022, 12:54 PM

## 2022-09-19 NOTE — ED Provider Notes (Signed)
Blood pressure 117/72, pulse 96, temperature 98.2 F (36.8 C), temperature source Oral, resp. rate 16, SpO2 95 %.  In short, David Kim is a 76 y.o. male with a chief complaint of IVC .  Refer to the original H&P for additional details.  05:30 PM  Called to the patient's bedside after he is requesting discharge to home.  In review of the chart, it appears as if he is currently awaiting placement at a rehab center as recommended by physical therapy.  After speaking with him, he tells me he needs to get home to pay some bills.  We discussed potentially having a friend bring his bills up to the hospital so that he can go through his things.  We discussed that it does not appear safe for him to return home by himself. He agrees with this and has a friend at bedside currently who will help with his bills at home. Case mgmt has gone through [facility] and are continuing to seek placement. Patient in agreement.     Margette Fast, MD 09/19/22 1904
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
## 2022-09-19 NOTE — Progress Notes (Signed)
CSW has made the APS report, CSW also will make sure to note that around the clock care will need to be set up for this patient. The patient is wanting and willing to go home however will need to pay for 24 hour care.

## 2022-09-19 NOTE — Social Work (Addendum)
CSW spoke to the niece the niece really feels like the patient does not need to go home. CSW also talk to the provider who is also hesitate in sending the patient home CSW has called after hours APS. CSW has also will have [facility] go out to the house. The brother in law states that the patient is very aggressive , as well not cooperate. The niece also states that he will not have anyone to care for him.

## 2022-09-19 NOTE — ED Provider Notes (Signed)
Emergency Medicine Observation Re-evaluation Note  David Kim is a 76 y.o. male, seen on rounds today.  Pt initially presented to the ED for complaints of IVC Currently, the patient is resting comfortably.  Physical Exam  BP 117/72   Pulse 96   Temp 98.2 F (36.8 C) (Oral)   Resp 16   SpO2 95%  Physical Exam General: NAD   ED Course / MDM  EKG:EKG Interpretation  Date/Time:  Monday September 18 2022 14:33:36 EST Ventricular Rate:  114 PR Interval:  148 QRS Duration: 86 QT Interval:  342 QTC Calculation: 471 R Axis:   46 Text Interpretation: Sinus tachycardia Low voltage QRS Cannot rule out Anterior infarct , age undetermined Abnormal ECG No previous ECGs available No acute changes Confirmed by Georgina Snell [id number]) on 09/18/2022 3:09:18 PM  I have reviewed the labs performed to date as well as medications administered while in observation.  Recent changes in the last 24 hours include no acute events reported.  Plan  Current plan is for placement.    Valarie Merino, MD 09/19/22 1020

## 2022-09-19 NOTE — Progress Notes (Addendum)
PASRR pending. Release uploaded to [facility]. No bed offers at this time.   [facility] will screen for medicaid eligibility.

## 2022-09-19 NOTE — Progress Notes (Signed)
Followed up with agencies who still show pending status.

## 2022-09-19 NOTE — Progress Notes (Signed)
CSW spoke to the patient and the does not want to do long term care he would rather go home. At this time the patient has been denied at all facilities, at this time the patient would need to be DC home with the home health that was set up as well community support.

## 2022-09-19 NOTE — Progress Notes (Addendum)
This CSW has referred the pt to Shanon Rosser with [facility] to see if the pt can be assessed as he does not have a long term payor source for LTC.   Addend @ 10:50 AM Pt has Designer, multimedia as Consulting civil engineer.

## 2022-09-20 DIAGNOSIS — Z7401 Bed confinement status: Secondary | ICD-10-CM | POA: Diagnosis not present

## 2022-09-20 DIAGNOSIS — R4182 Altered mental status, unspecified: Secondary | ICD-10-CM | POA: Diagnosis not present

## 2022-09-20 NOTE — Progress Notes (Signed)
Per [facility] rep, David Kim, the pt is over the income limit for Medicaid.

## 2022-09-20 NOTE — ED Provider Notes (Signed)
Emergency Medicine Observation Re-evaluation Note  David Kim is a 76 y.o. male, seen on rounds today.  Pt initially presented to the ED for complaints of IVC Currently, the patient is resting comfortably.  Physical Exam  BP 124/72 (BP Location: Left Arm)   Pulse 96   Temp 97.7 F (36.5 C) (Oral)   Resp 16   SpO2 93%  Physical Exam Vitals and nursing note reviewed.  Constitutional:      General: He is not in acute distress.    Appearance: He is well-developed.  HENT:     Head: Normocephalic and atraumatic.  Eyes:     Conjunctiva/sclera: Conjunctivae normal.  Cardiovascular:     Rate and Rhythm: Normal rate and regular rhythm.     Heart sounds: No murmur heard. Pulmonary:     Effort: Pulmonary effort is normal. No respiratory distress.  Musculoskeletal:        General: No swelling.     Cervical back: Neck supple.  Skin:    General: Skin is warm and dry.  Neurological:     Mental Status: He is alert.  Psychiatric:        Mood and Affect: Mood normal.     ED Course / MDM  EKG:EKG Interpretation  Date/Time:  Monday September 18 2022 14:33:36 EST Ventricular Rate:  114 PR Interval:  148 QRS Duration: 86 QT Interval:  342 QTC Calculation: 471 R Axis:   46 Text Interpretation: Sinus tachycardia Low voltage QRS Cannot rule out Anterior infarct , age undetermined Abnormal ECG No previous ECGs available No acute changes Confirmed by Georgina Snell [phone]) on 09/18/2022 3:09:18 PM  I have reviewed the labs performed to date as well as medications administered while in observation.  Recent changes in the last 24 hours include evaluation by TOC and patient has successfully obtained home health PT OT and social work and is thus safe for discharge.  IVC rescinded and patient discharged  Plan  Current plan is for discharge.    Teressa Lower, MD 09/20/22 1630

## 2022-09-20 NOTE — Progress Notes (Signed)
CSW has called [facility]. CSW has made family aware that the patient is going home.

## 2022-09-20 NOTE — Discharge Instructions (Signed)
Home health assistance has been provided by our social work team.  They should be assisting you to take care of yourself at home.  Return the emergency department if you have any chest pain, shortness of breath, abdominal pain, fever or any other concerning symptoms

## 2022-09-20 NOTE — Progress Notes (Addendum)
Patient met with representatives from [facility] home care and completed intake paperwork for 24/7 in home care.  Per [facility] director Margaretha Sheffield, an aide will be in the home this evening at 6pm.  Patient is aware of this.  Patient also set up for [facility] PT/OT/SW.  Patient will need to transport home by EMS.  CM left a HIPAA complaint VM for DSS CSW Josetta Huddle requesting call back to provide update.

## 2022-09-20 NOTE — TOC Transition Note (Signed)
Transition of Care [facility]) - CM/SW Discharge Note   Patient Details  Name: David Kim MRN: [id number] Date of Birth: 06-Feb-1946  Transition of Care [facility]) CM/SW Contact:  Roseanne Kaufman, RN Phone Number: 09/20/2022, 4:37 PM   Clinical Narrative:   Notice of Commitment change form signed by EDP. IVC rescind faxed to [facility] will continue to follow for discharge needs.         Patient Goals and CMS Choice        Discharge Placement                       Discharge Plan and Services                                     Social Determinants of Health (SDOH) Interventions     Readmission Risk Interventions     No data to display
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
## 2022-09-20 NOTE — ED Provider Notes (Signed)
Emergency Medicine Observation Re-evaluation Note  David Kim is a 76 y.o. male, seen on rounds today.  Pt initially presented to the ED for complaints of IVC Currently, the patient is resting.  Physical Exam  BP 124/72 (BP Location: Left Arm)   Pulse 96   Temp 97.7 F (36.5 C) (Oral)   Resp 16   SpO2 93%  Physical Exam General: NAD Cardiac: well perfused Lungs: even and unlabored Psych: no agitation  ED Course / MDM  EKG:EKG Interpretation  Date/Time:  Monday September 18 2022 14:33:36 EST Ventricular Rate:  114 PR Interval:  148 QRS Duration: 86 QT Interval:  342 QTC Calculation: 471 R Axis:   46 Text Interpretation: Sinus tachycardia Low voltage QRS Cannot rule out Anterior infarct , age undetermined Abnormal ECG No previous ECGs available No acute changes Confirmed by Georgina Snell [phone]) on 09/18/2022 3:09:18 PM  I have reviewed the labs performed to date as well as medications administered while in observation.  Recent changes in the last 24 hours include CSW made APS report, SW working on placement.  Notes 11/14:   CSW has made the APS report, CSW also will make sure to note that around the clock care will need to be set up for this patient. The patient is wanting and willing to go home however will need to pay for 24 hour care.       Plan  Current plan is for SW placement.    Regan Lemming, MD 09/20/22 [phone]

## 2022-09-25 ENCOUNTER — Telehealth: Payer: Self-pay | Admitting: Internal Medicine

## 2022-09-25 NOTE — Telephone Encounter (Signed)
David Kim at [facility] called to report pt has no need for full time OT assistance. Pt is living independently with no issues. Pt has a home health aid that cooks, cleans, assists with any needs. Pt is able to: transfer in and out of bed. ADLs are good Manages his own prescriptions [place] himself with the aid handing him supplies  Call [facility] with any other questions. Declined to accept pt for home health assistance.

## 2022-09-26 NOTE — Telephone Encounter (Signed)
FYI, please advise

## 2022-10-15 ENCOUNTER — Inpatient Hospital Stay (HOSPITAL_COMMUNITY)
Admission: EM | Admit: 2022-10-15 | Discharge: 2022-11-17 | DRG: 917 | Disposition: A | Discharge status: Skilled Nursing Facility | Payer: Medicare Other | Attending: Internal Medicine | Admitting: Internal Medicine

## 2022-10-15 ENCOUNTER — Emergency Department (HOSPITAL_COMMUNITY): Payer: Medicare Other

## 2022-10-15 ENCOUNTER — Other Ambulatory Visit: Payer: Self-pay

## 2022-10-15 DIAGNOSIS — Z79899 Other long term (current) drug therapy: Secondary | ICD-10-CM

## 2022-10-15 DIAGNOSIS — G9340 Encephalopathy, unspecified: Secondary | ICD-10-CM | POA: Diagnosis not present

## 2022-10-15 DIAGNOSIS — K626 Ulcer of anus and rectum: Secondary | ICD-10-CM

## 2022-10-15 DIAGNOSIS — G928 Other toxic encephalopathy: Secondary | ICD-10-CM | POA: Diagnosis present

## 2022-10-15 DIAGNOSIS — T428X2A Poisoning by antiparkinsonism drugs and other central muscle-tone depressants, intentional self-harm, initial encounter: Secondary | ICD-10-CM | POA: Diagnosis present

## 2022-10-15 DIAGNOSIS — K5909 Other constipation: Secondary | ICD-10-CM | POA: Diagnosis not present

## 2022-10-15 DIAGNOSIS — G253 Myoclonus: Secondary | ICD-10-CM | POA: Diagnosis present

## 2022-10-15 DIAGNOSIS — E872 Acidosis, unspecified: Secondary | ICD-10-CM | POA: Diagnosis present

## 2022-10-15 DIAGNOSIS — T426X2A Poisoning by other antiepileptic and sedative-hypnotic drugs, intentional self-harm, initial encounter: Secondary | ICD-10-CM | POA: Diagnosis not present

## 2022-10-15 DIAGNOSIS — T50901A Poisoning by unspecified drugs, medicaments and biological substances, accidental (unintentional), initial encounter: Secondary | ICD-10-CM | POA: Diagnosis present

## 2022-10-15 DIAGNOSIS — A419 Sepsis, unspecified organism: Secondary | ICD-10-CM | POA: Diagnosis present

## 2022-10-15 DIAGNOSIS — K5180 Other ulcerative colitis without complications: Secondary | ICD-10-CM | POA: Diagnosis present

## 2022-10-15 DIAGNOSIS — T50902A Poisoning by unspecified drugs, medicaments and biological substances, intentional self-harm, initial encounter: Principal | ICD-10-CM

## 2022-10-15 DIAGNOSIS — F333 Major depressive disorder, recurrent, severe with psychotic symptoms: Secondary | ICD-10-CM | POA: Diagnosis not present

## 2022-10-15 DIAGNOSIS — R4182 Altered mental status, unspecified: Secondary | ICD-10-CM | POA: Diagnosis not present

## 2022-10-15 DIAGNOSIS — F332 Major depressive disorder, recurrent severe without psychotic features: Secondary | ICD-10-CM | POA: Diagnosis not present

## 2022-10-15 DIAGNOSIS — N3281 Overactive bladder: Secondary | ICD-10-CM | POA: Diagnosis not present

## 2022-10-15 DIAGNOSIS — M109 Gout, unspecified: Secondary | ICD-10-CM | POA: Diagnosis not present

## 2022-10-15 DIAGNOSIS — Z515 Encounter for palliative care: Secondary | ICD-10-CM | POA: Diagnosis not present

## 2022-10-15 DIAGNOSIS — E876 Hypokalemia: Secondary | ICD-10-CM | POA: Diagnosis not present

## 2022-10-15 DIAGNOSIS — F29 Unspecified psychosis not due to a substance or known physiological condition: Secondary | ICD-10-CM | POA: Diagnosis not present

## 2022-10-15 DIAGNOSIS — T443X2A Poisoning by other parasympatholytics [anticholinergics and antimuscarinics] and spasmolytics, intentional self-harm, initial encounter: Secondary | ICD-10-CM | POA: Diagnosis present

## 2022-10-15 DIAGNOSIS — Z634 Disappearance and death of family member: Secondary | ICD-10-CM

## 2022-10-15 DIAGNOSIS — A4151 Sepsis due to Escherichia coli [E. coli]: Secondary | ICD-10-CM | POA: Diagnosis present

## 2022-10-15 DIAGNOSIS — B9620 Unspecified Escherichia coli [E. coli] as the cause of diseases classified elsewhere: Secondary | ICD-10-CM | POA: Diagnosis not present

## 2022-10-15 DIAGNOSIS — I5031 Acute diastolic (congestive) heart failure: Secondary | ICD-10-CM | POA: Diagnosis not present

## 2022-10-15 DIAGNOSIS — G5601 Carpal tunnel syndrome, right upper limb: Secondary | ICD-10-CM | POA: Diagnosis not present

## 2022-10-15 DIAGNOSIS — K5289 Other specified noninfective gastroenteritis and colitis: Secondary | ICD-10-CM | POA: Diagnosis present

## 2022-10-15 DIAGNOSIS — R509 Fever, unspecified: Secondary | ICD-10-CM | POA: Diagnosis not present

## 2022-10-15 DIAGNOSIS — I959 Hypotension, unspecified: Secondary | ICD-10-CM | POA: Diagnosis not present

## 2022-10-15 DIAGNOSIS — F13231 Sedative, hypnotic or anxiolytic dependence with withdrawal delirium: Secondary | ICD-10-CM | POA: Diagnosis not present

## 2022-10-15 DIAGNOSIS — L8990 Pressure ulcer of unspecified site, unspecified stage: Secondary | ICD-10-CM | POA: Insufficient documentation

## 2022-10-15 DIAGNOSIS — R933 Abnormal findings on diagnostic imaging of other parts of digestive tract: Secondary | ICD-10-CM

## 2022-10-15 DIAGNOSIS — F13931 Sedative, hypnotic or anxiolytic use, unspecified with withdrawal delirium: Secondary | ICD-10-CM | POA: Diagnosis not present

## 2022-10-15 DIAGNOSIS — T50902D Poisoning by unspecified drugs, medicaments and biological substances, intentional self-harm, subsequent encounter: Secondary | ICD-10-CM | POA: Diagnosis not present

## 2022-10-15 DIAGNOSIS — Z9151 Personal history of suicidal behavior: Secondary | ICD-10-CM

## 2022-10-15 DIAGNOSIS — G8929 Other chronic pain: Secondary | ICD-10-CM | POA: Diagnosis present

## 2022-10-15 DIAGNOSIS — F339 Major depressive disorder, recurrent, unspecified: Secondary | ICD-10-CM | POA: Diagnosis not present

## 2022-10-15 DIAGNOSIS — N3289 Other specified disorders of bladder: Secondary | ICD-10-CM | POA: Diagnosis not present

## 2022-10-15 DIAGNOSIS — T50902S Poisoning by unspecified drugs, medicaments and biological substances, intentional self-harm, sequela: Secondary | ICD-10-CM | POA: Diagnosis not present

## 2022-10-15 DIAGNOSIS — T1491XA Suicide attempt, initial encounter: Secondary | ICD-10-CM | POA: Diagnosis not present

## 2022-10-15 DIAGNOSIS — I10 Essential (primary) hypertension: Secondary | ICD-10-CM | POA: Diagnosis not present

## 2022-10-15 DIAGNOSIS — Z20822 Contact with and (suspected) exposure to covid-19: Secondary | ICD-10-CM | POA: Diagnosis present

## 2022-10-15 DIAGNOSIS — J9601 Acute respiratory failure with hypoxia: Secondary | ICD-10-CM | POA: Diagnosis present

## 2022-10-15 DIAGNOSIS — G8221 Paraplegia, complete: Secondary | ICD-10-CM | POA: Diagnosis not present

## 2022-10-15 DIAGNOSIS — R410 Disorientation, unspecified: Secondary | ICD-10-CM | POA: Diagnosis not present

## 2022-10-15 DIAGNOSIS — E8809 Other disorders of plasma-protein metabolism, not elsewhere classified: Secondary | ICD-10-CM | POA: Diagnosis not present

## 2022-10-15 DIAGNOSIS — G8220 Paraplegia, unspecified: Secondary | ICD-10-CM | POA: Diagnosis not present

## 2022-10-15 DIAGNOSIS — M6281 Muscle weakness (generalized): Secondary | ICD-10-CM | POA: Diagnosis not present

## 2022-10-15 DIAGNOSIS — K581 Irritable bowel syndrome with constipation: Secondary | ICD-10-CM | POA: Diagnosis not present

## 2022-10-15 DIAGNOSIS — Z66 Do not resuscitate: Secondary | ICD-10-CM | POA: Diagnosis present

## 2022-10-15 DIAGNOSIS — N529 Male erectile dysfunction, unspecified: Secondary | ICD-10-CM | POA: Diagnosis present

## 2022-10-15 DIAGNOSIS — K922 Gastrointestinal hemorrhage, unspecified: Secondary | ICD-10-CM | POA: Diagnosis not present

## 2022-10-15 DIAGNOSIS — T424X2A Poisoning by benzodiazepines, intentional self-harm, initial encounter: Secondary | ICD-10-CM | POA: Diagnosis present

## 2022-10-15 DIAGNOSIS — K592 Neurogenic bowel, not elsewhere classified: Secondary | ICD-10-CM | POA: Diagnosis present

## 2022-10-15 DIAGNOSIS — G4733 Obstructive sleep apnea (adult) (pediatric): Secondary | ICD-10-CM | POA: Diagnosis present

## 2022-10-15 DIAGNOSIS — Z7189 Other specified counseling: Secondary | ICD-10-CM

## 2022-10-15 DIAGNOSIS — F4323 Adjustment disorder with mixed anxiety and depressed mood: Secondary | ICD-10-CM | POA: Diagnosis present

## 2022-10-15 DIAGNOSIS — F411 Generalized anxiety disorder: Secondary | ICD-10-CM | POA: Diagnosis not present

## 2022-10-15 DIAGNOSIS — D62 Acute posthemorrhagic anemia: Secondary | ICD-10-CM

## 2022-10-15 DIAGNOSIS — J9811 Atelectasis: Secondary | ICD-10-CM | POA: Diagnosis not present

## 2022-10-15 DIAGNOSIS — R0689 Other abnormalities of breathing: Secondary | ICD-10-CM | POA: Diagnosis not present

## 2022-10-15 DIAGNOSIS — N3000 Acute cystitis without hematuria: Secondary | ICD-10-CM | POA: Diagnosis present

## 2022-10-15 DIAGNOSIS — K5900 Constipation, unspecified: Secondary | ICD-10-CM

## 2022-10-15 DIAGNOSIS — Z888 Allergy status to other drugs, medicaments and biological substances status: Secondary | ICD-10-CM

## 2022-10-15 DIAGNOSIS — Z743 Need for continuous supervision: Secondary | ICD-10-CM | POA: Diagnosis not present

## 2022-10-15 DIAGNOSIS — N179 Acute kidney failure, unspecified: Secondary | ICD-10-CM | POA: Diagnosis present

## 2022-10-15 DIAGNOSIS — Z8546 Personal history of malignant neoplasm of prostate: Secondary | ICD-10-CM

## 2022-10-15 DIAGNOSIS — F32A Depression, unspecified: Secondary | ICD-10-CM | POA: Diagnosis present

## 2022-10-15 DIAGNOSIS — Y92009 Unspecified place in unspecified non-institutional (private) residence as the place of occurrence of the external cause: Secondary | ICD-10-CM

## 2022-10-15 DIAGNOSIS — T465X2A Poisoning by other antihypertensive drugs, intentional self-harm, initial encounter: Secondary | ICD-10-CM | POA: Diagnosis not present

## 2022-10-15 DIAGNOSIS — M4856XA Collapsed vertebra, not elsewhere classified, lumbar region, initial encounter for fracture: Secondary | ICD-10-CM | POA: Diagnosis not present

## 2022-10-15 DIAGNOSIS — R7881 Bacteremia: Secondary | ICD-10-CM | POA: Diagnosis present

## 2022-10-15 DIAGNOSIS — R159 Full incontinence of feces: Secondary | ICD-10-CM | POA: Diagnosis present

## 2022-10-15 DIAGNOSIS — J189 Pneumonia, unspecified organism: Secondary | ICD-10-CM | POA: Diagnosis not present

## 2022-10-15 DIAGNOSIS — Z7401 Bed confinement status: Secondary | ICD-10-CM

## 2022-10-15 DIAGNOSIS — Z880 Allergy status to penicillin: Secondary | ICD-10-CM

## 2022-10-15 DIAGNOSIS — Z8711 Personal history of peptic ulcer disease: Secondary | ICD-10-CM

## 2022-10-15 DIAGNOSIS — S24109S Unspecified injury at unspecified level of thoracic spinal cord, sequela: Secondary | ICD-10-CM | POA: Diagnosis not present

## 2022-10-15 DIAGNOSIS — K625 Hemorrhage of anus and rectum: Secondary | ICD-10-CM | POA: Diagnosis not present

## 2022-10-15 DIAGNOSIS — R441 Visual hallucinations: Secondary | ICD-10-CM | POA: Diagnosis not present

## 2022-10-15 DIAGNOSIS — L89152 Pressure ulcer of sacral region, stage 2: Secondary | ICD-10-CM | POA: Diagnosis present

## 2022-10-15 DIAGNOSIS — N390 Urinary tract infection, site not specified: Secondary | ICD-10-CM | POA: Diagnosis present

## 2022-10-15 DIAGNOSIS — R0902 Hypoxemia: Secondary | ICD-10-CM | POA: Diagnosis not present

## 2022-10-15 DIAGNOSIS — T50904A Poisoning by unspecified drugs, medicaments and biological substances, undetermined, initial encounter: Secondary | ICD-10-CM | POA: Diagnosis not present

## 2022-10-15 DIAGNOSIS — T887XXA Unspecified adverse effect of drug or medicament, initial encounter: Secondary | ICD-10-CM | POA: Diagnosis not present

## 2022-10-15 DIAGNOSIS — S72001K Fracture of unspecified part of neck of right femur, subsequent encounter for closed fracture with nonunion: Secondary | ICD-10-CM | POA: Diagnosis not present

## 2022-10-15 DIAGNOSIS — K219 Gastro-esophageal reflux disease without esophagitis: Secondary | ICD-10-CM | POA: Diagnosis present

## 2022-10-15 DIAGNOSIS — L89153 Pressure ulcer of sacral region, stage 3: Secondary | ICD-10-CM | POA: Diagnosis not present

## 2022-10-15 DIAGNOSIS — R4189 Other symptoms and signs involving cognitive functions and awareness: Secondary | ICD-10-CM | POA: Diagnosis present

## 2022-10-15 DIAGNOSIS — Z7982 Long term (current) use of aspirin: Secondary | ICD-10-CM

## 2022-10-15 DIAGNOSIS — S72141K Displaced intertrochanteric fracture of right femur, subsequent encounter for closed fracture with nonunion: Secondary | ICD-10-CM | POA: Diagnosis not present

## 2022-10-15 DIAGNOSIS — R918 Other nonspecific abnormal finding of lung field: Secondary | ICD-10-CM | POA: Diagnosis present

## 2022-10-15 DIAGNOSIS — R000 Tachycardia, unspecified: Secondary | ICD-10-CM | POA: Diagnosis not present

## 2022-10-15 DIAGNOSIS — K6289 Other specified diseases of anus and rectum: Secondary | ICD-10-CM | POA: Diagnosis not present

## 2022-10-15 DIAGNOSIS — R748 Abnormal levels of other serum enzymes: Secondary | ICD-10-CM | POA: Diagnosis present

## 2022-10-15 DIAGNOSIS — F331 Major depressive disorder, recurrent, moderate: Secondary | ICD-10-CM | POA: Diagnosis present

## 2022-10-15 DIAGNOSIS — N319 Neuromuscular dysfunction of bladder, unspecified: Secondary | ICD-10-CM | POA: Diagnosis not present

## 2022-10-15 DIAGNOSIS — E291 Testicular hypofunction: Secondary | ICD-10-CM | POA: Diagnosis present

## 2022-10-15 DIAGNOSIS — R4781 Slurred speech: Secondary | ICD-10-CM | POA: Diagnosis present

## 2022-10-15 LAB — ETHANOL: Alcohol, Ethyl (B): <10 mg/dL (ref <10)

## 2022-10-15 LAB — COMPREHENSIVE METABOLIC PANEL
ALT: 33 U/L (ref 0–44)
AST: 92 U/L — ABNORMAL HIGH (ref 15–41)
Albumin: 3.3 g/dL — ABNORMAL LOW (ref 3.5–5.0)
Alkaline Phosphatase: 63 U/L (ref 38–126)
Anion gap: 13 (ref 5–15)
BUN: 35 mg/dL — ABNORMAL HIGH (ref 8–23)
CO2: 19 mmol/L — ABNORMAL LOW (ref 22–32)
Calcium: 8.1 mg/dL — ABNORMAL LOW (ref 8.9–10.3)
Chloride: 108 mmol/L (ref 98–111)
Creatinine, Ser: 1.23 mg/dL (ref 0.61–1.24)
GFR, Estimated: >60 mL/min (ref >60)
Glucose, Bld: 100 mg/dL — ABNORMAL HIGH (ref 70–99)
Potassium: 3.6 mmol/L (ref 3.5–5.1)
Sodium: 140 mmol/L (ref 135–145)
Total Bilirubin: 1.6 mg/dL — ABNORMAL HIGH (ref 0.3–1.2)
Total Protein: 6.5 g/dL (ref 6.5–8.1)

## 2022-10-15 LAB — CBC WITH DIFFERENTIAL/PLATELET
Abs Immature Granulocytes: 0.08 10*3/uL — ABNORMAL HIGH (ref 0.00–0.07)
Basophils Absolute: 0.1 10*3/uL (ref 0.0–0.1)
Basophils Relative: 0 %
Eosinophils Absolute: 0 10*3/uL (ref 0.0–0.5)
Eosinophils Relative: 0 %
HCT: 45.6 % (ref 39.0–52.0)
Hemoglobin: 14.7 g/dL (ref 13.0–17.0)
Immature Granulocytes: 1 %
Lymphocytes Relative: 3 %
Lymphs Abs: 0.5 10*3/uL — ABNORMAL LOW (ref 0.7–4.0)
MCH: 29.3 pg (ref 26.0–34.0)
MCHC: 32.2 g/dL (ref 30.0–36.0)
MCV: 91 fL (ref 80.0–100.0)
Monocytes Absolute: 1 10*3/uL (ref 0.1–1.0)
Monocytes Relative: 6 %
Neutro Abs: 14.6 10*3/uL — ABNORMAL HIGH (ref 1.7–7.7)
Neutrophils Relative %: 90 %
Platelets: 246 10*3/uL (ref 150–400)
RBC: 5.01 MIL/uL (ref 4.22–5.81)
RDW: 13.7 % (ref 11.5–15.5)
WBC: 16.3 10*3/uL — ABNORMAL HIGH (ref 4.0–10.5)
nRBC: 0 % (ref 0.0–0.2)

## 2022-10-15 LAB — URINALYSIS, ROUTINE W REFLEX MICROSCOPIC
Bilirubin Urine: NEGATIVE
Glucose, UA: NEGATIVE mg/dL
Ketones, ur: 20 mg/dL — AB
Nitrite: POSITIVE — AB
Protein, ur: 30 mg/dL — AB
Specific Gravity, Urine: 1.011 (ref 1.005–1.030)
WBC, UA: >50 WBC/hpf — ABNORMAL HIGH (ref 0–5)
pH: 5 (ref 5.0–8.0)

## 2022-10-15 LAB — ACETAMINOPHEN LEVEL: Acetaminophen (Tylenol), Serum: <10 ug/mL — ABNORMAL LOW (ref 10–30)

## 2022-10-15 LAB — PROTIME-INR
INR: 1.2 (ref 0.8–1.2)
Prothrombin Time: 15.2 seconds (ref 11.4–15.2)

## 2022-10-15 LAB — RESP PANEL BY RT-PCR (RSV, FLU A&B, COVID)  RVPGX2
Influenza A by PCR: NEGATIVE
Influenza B by PCR: NEGATIVE
Resp Syncytial Virus by PCR: NEGATIVE
SARS Coronavirus 2 by RT PCR: NEGATIVE

## 2022-10-15 LAB — VALPROIC ACID LEVEL: Valproic Acid Lvl: <10 ug/mL — ABNORMAL LOW (ref 50.0–100.0)

## 2022-10-15 LAB — LACTIC ACID, PLASMA
Lactic Acid, Venous: 1.4 mmol/L (ref 0.5–1.9)
Lactic Acid, Venous: 0.9 mmol/L (ref 0.5–1.9)

## 2022-10-15 LAB — AMMONIA: Ammonia: 28 umol/L (ref 9–35)

## 2022-10-15 MED ORDER — SODIUM CHLORIDE 0.9 % IV BOLUS
1000.0000 mL | Freq: Once | INTRAVENOUS | Status: AC
  Administered 2022-10-15: 1000 mL via INTRAVENOUS

## 2022-10-15 MED ORDER — ENOXAPARIN SODIUM 40 MG/0.4ML IJ SOSY
40.0000 mg | PREFILLED_SYRINGE | INTRAMUSCULAR | Status: DC
  Administered 2022-10-16 – 2022-11-17 (×32): 40 mg via SUBCUTANEOUS
  Filled 2022-10-15 (×32): qty 0.4

## 2022-10-15 MED ORDER — SODIUM CHLORIDE 0.9 % IV SOLN: 1.0000 g | INTRAVENOUS | Status: DC

## 2022-10-15 MED ORDER — SODIUM CHLORIDE 0.9 % IV SOLN: INTRAVENOUS | Status: AC

## 2022-10-15 MED ORDER — ACETAMINOPHEN 650 MG RE SUPP
650.0000 mg | Freq: Four times a day (QID) | RECTAL | Status: DC | PRN
  Administered 2022-10-17: 650 mg via RECTAL
  Filled 2022-10-15: qty 1

## 2022-10-15 MED ORDER — SODIUM CHLORIDE 0.9 % IV SOLN
2.0000 g | Freq: Once | INTRAVENOUS | Status: AC
  Administered 2022-10-15: 2 g via INTRAVENOUS
  Filled 2022-10-15: qty 20

## 2022-10-15 MED ORDER — ACETAMINOPHEN 325 MG PO TABS
650.0000 mg | ORAL_TABLET | Freq: Four times a day (QID) | ORAL | Status: DC | PRN
  Administered 2022-10-17: 650 mg via ORAL
  Filled 2022-10-15 (×3): qty 2

## 2022-10-15 NOTE — ED Provider Notes (Signed)
[facility] DEPT Provider Note   CSN: [id number] Arrival date & time: 10/15/22  1836     History  Chief Complaint  Patient presents with   Ingestion   Suicidal    David Kim is a 76 y.o. male.   Ingestion  Patient reportedly came in for overdose.  Reportedly took medicines in a suicide attempt although patient will not really talk to me about it.  Reportedly home health aide called.  Possible ingestion with bottles of reported losartan tizanidine oxybutynin baclofen and clonazepam.  Patient reportedly told clonazepam and tizanidine.  Unsure when he took him or how many he took.  Is paraplegic at baseline.  Also found to be febrile.  Reportedly was hypoxic for EMS.  Temperature upon arrival of 102.5.    Past Medical History:  Diagnosis Date   Allergic rhinitis    ALLERGIC RHINITIS 10/01/2007   Qualifier: Diagnosis of  By: Jenny Reichmann MD, Hunt Oris    Anxiety    Carpal tunnel syndrome    right   Depression    Depression    Erectile dysfunction    Fatigue    GERD (gastroesophageal reflux disease)    Gout    Hypertension    HYPOGONADISM 03/31/2008   Qualifier: Diagnosis of  By: Jenny Reichmann MD, Hunt Oris    IBS (irritable bowel syndrome)    Low back pain    OSA (obstructive sleep apnea)    Paraplegia ([place]) 04/23/2009   Qualifier: Diagnosis of  By: Jenny Reichmann MD, Hunt Oris    Peptic ulcer disease    PEPTIC ULCER DISEASE 10/01/2007   Qualifier: Diagnosis of  By: Jenny Reichmann MD, Hunt Oris    Prostate cancer [facility])    PROSTATE CANCER, HX OF 05/23/2007   Qualifier: Diagnosis of  By: Jenny Reichmann MD, [place] PARALYSIS 04/23/2009   Qualifier: Diagnosis of  By: Jenny Reichmann MD, Hunt Oris    Thoracic spinal cord injury ([place]) 03/12/2012    Home Medications Prior to Admission medications   Medication Sig Start Date End Date Taking? Authorizing Provider  aspirin 81 MG EC tablet Take 81 mg by mouth daily.    [provider]  baclofen (LIORESAL) 20 MG tablet TAKE 1 TABLET(20 MG)  BY MOUTH FOUR TIMES DAILY Patient taking differently: Take 20 mg by mouth 4 (four) times daily. 02/16/22   Meredith Staggers, MD  calcium citrate-vitamin D 500-500 MG-UNIT chewable tablet Chew 1 tablet by mouth daily. 10/01/19   Love, Ivan Anchors, PA-C  cholecalciferol (VITAMIN D3) 25 MCG (1000 UT) tablet Take 1,000 Units by mouth daily.    [provider]  citalopram (CELEXA) 10 MG tablet Take 1 tablet (10 mg total) by mouth daily. 09/08/22 09/08/23  Biagio Borg, MD  clonazePAM (KLONOPIN) 2 MG tablet Take 1 tablet (2 mg total) by mouth at bedtime. 09/18/22   Delfin Gant, NP  divalproex (DEPAKOTE ER) 500 MG 24 hr tablet Take 1 tablet (500 mg total) by mouth at bedtime. 09/18/22 10/18/22  Delfin Gant, NP  losartan (COZAAR) 50 MG tablet TAKE 1 TABLET(50 MG) BY MOUTH TWICE DAILY Patient taking differently: Take 50 mg by mouth 2 (two) times daily. TAKE 1 TABLET(50 MG) BY MOUTH TWICE DAILY 01/31/22   Biagio Borg, MD  Multiple Vitamin (MULTIVITAMIN) capsule Take 1 capsule by mouth daily.    [provider]  Multiple Vitamins-Minerals (ZINC PO) Take by mouth.    [provider]  oxybutynin (DITROPAN) 5  MG tablet Take 5 mg by mouth 4 (four) times daily.    [provider]  tiZANidine (ZANAFLEX) 4 MG tablet TAKE 1-2 TABLETS BY MOUTH FOUR TIMES DAILY Patient taking differently: Take 4 mg by mouth in the morning, at noon, in the evening, and at bedtime. 1-2 tablets po four times daily 09/04/22   Meredith Staggers, MD  vitamin C (ASCORBIC ACID) 500 MG tablet Take 500 mg by mouth daily.    [provider]      Allergies    Amoxicillin and Endal hd    Review of Systems   Review of Systems  Physical Exam Updated Vital Signs BP 115/67   Pulse 96   Temp (!) 102.5 F (39.2 C) (Rectal)   Resp 18   SpO2 97%  Physical Exam Vitals and nursing note reviewed.  Eyes:     Pupils: Pupils are equal, round, and reactive to light.  Cardiovascular:      Rate and Rhythm: Tachycardia present.  Pulmonary:     Breath sounds: No wheezing.  Chest:     Chest wall: No tenderness.  Abdominal:     Tenderness: There is no abdominal tenderness.  Skin:    Comments: Does have some sacral pressure wounds.  The central area is approximately 2 to 3 cm around it does go through the top layer of the skin.  There is some surrounding erythema.  Neurological:     Comments: Somewhat decreased mental status.  Will answer some questions and unsure if this is behavioral versus potential sedation.     ED Results / Procedures / Treatments   Labs (all labs ordered are listed, but only abnormal results are displayed) Labs Reviewed  URINALYSIS, ROUTINE W REFLEX MICROSCOPIC - Abnormal; Notable for the following components:      Result Value   APPearance TURBID (*)    Hgb urine dipstick MODERATE (*)    Ketones, ur 20 (*)    Protein, ur 30 (*)    Nitrite POSITIVE (*)    Leukocytes,Ua LARGE (*)    WBC, UA >50 (*)    Bacteria, UA FEW (*)    All other components within normal limits  COMPREHENSIVE METABOLIC PANEL - Abnormal; Notable for the following components:   CO2 19 (*)    Glucose, Bld 100 (*)    BUN 35 (*)    Calcium 8.1 (*)    Albumin 3.3 (*)    AST 92 (*)    Total Bilirubin 1.6 (*)    All other components within normal limits  CBC WITH DIFFERENTIAL/PLATELET - Abnormal; Notable for the following components:   WBC 16.3 (*)    Neutro Abs 14.6 (*)    Lymphs Abs 0.5 (*)    Abs Immature Granulocytes 0.08 (*)    All other components within normal limits  ACETAMINOPHEN LEVEL - Abnormal; Notable for the following components:   Acetaminophen (Tylenol), Serum <10 (*)    All other components within normal limits  RESP PANEL BY RT-PCR (RSV, FLU A&B, COVID)  RVPGX2  URINE CULTURE  CULTURE, BLOOD (ROUTINE X 2)  CULTURE, BLOOD (ROUTINE X 2)  LACTIC ACID, PLASMA  AMMONIA  PROTIME-INR  ETHANOL  LACTIC ACID, PLASMA  VALPROIC ACID LEVEL    EKG EKG  Interpretation  Date/Time:  Sunday October 15 2022 18:58:58 EST Ventricular Rate:  111 PR Interval:  147 QRS Duration: 97 QT Interval:  343 QTC Calculation: 467 R Axis:   68 Text Interpretation: Sinus tachycardia Confirmed by  Davonna Belling [phone]) on 10/15/2022 7:38:45 PM  Radiology DG Chest Portable 1 View  Result Date: 10/15/2022 CLINICAL DATA:  Fever, drug overdose EXAM: PORTABLE CHEST 1 VIEW COMPARISON:  09/18/2022 FINDINGS: Lungs are clear. No pneumothorax or pleural effusion. Stable cardiomegaly. Pulmonary vascularity is normal. No acute bone abnormality. IMPRESSION: 1. No active disease. Stable cardiomegaly. Electronically Signed   By: Fidela Salisbury M.D.   On: 10/15/2022 19:35    Procedures Procedures    Medications Ordered in ED Medications  cefTRIAXone (ROCEPHIN) 2 g in sodium chloride 0.9 % 100 mL IVPB (has no administration in time range)  sodium chloride 0.9 % bolus 1,000 mL (1,000 mLs Intravenous New Bag/Given 10/15/22 1930)    ED Course/ Medical Decision Making/ A&P                           Medical Decision Making Amount and/or Complexity of Data Reviewed Labs: ordered. Radiology: ordered.   Patient was reported overdose.  Unknown time and not necessarily known medicines.  Will need 12 hours of monitoring.  IV fluids for hypotension.  Will give fluid bolus here.  Will get chest x-ray and urine sample along with blood work to evaluate for the fever.  Will also check for coingestions.  Lab work reassuring.  Mental status still somewhat sedate.  Tachycardia has resolved.  Labs reassuring.  Depakote level still pending.  Urine does show likely infection.  I think this is the source.  Normal lactic acid.  Does not appear to be severely septic at this time.  Will give Rocephin which she has tolerated in the past.  Will require at least 12 hours of mountaineering for the overdose and with the fever I think he will need to be admitted to the hospital.  CRITICAL  CARE Performed by: Davonna Belling Total critical care time: 30 minutes Critical care time was exclusive of separately billable procedures and treating other patients. Critical care was necessary to treat or prevent imminent or life-threatening deterioration. Critical care was time spent personally by me on the following activities: development of treatment plan with patient and/or surrogate as well as nursing, discussions with consultants, evaluation of patient's response to treatment, examination of patient, obtaining history from patient or surrogate, ordering and performing treatments and interventions, ordering and review of laboratory studies, ordering and review of radiographic studies, pulse oximetry and re-evaluation of patient's condition.           Final Clinical Impression(s) / ED Diagnoses Final diagnoses:  Intentional overdose, initial encounter [facility])  Acute cystitis without hematuria    Rx / DC Orders ED Discharge Orders     None         Davonna Belling, MD 10/15/22 2138

## 2022-10-15 NOTE — ED Triage Notes (Signed)
EMS reports from home, [facility] Dept called out by home health aide for SI. Home health aid states he took a bunch of pills, but could not state where when or how much was taken. EMS found bottles of losartan, tizanidine. Oxybutynin, baclofen and clonazepam. Pt states he took some clonazepam and tizanidine but refused to state how many. Pt paraplegic.  BP 126/76 HR 120 RR 30 Sp02 89 RA (using cpap on arrival) Temp 103.2  16ga LAC '1000mg'$  NS enroute.

## 2022-10-15 NOTE — H&P (Signed)
History and Physical    David MARRAZZO MOQ:[id number] DOB: 1946-04-27 DOA: 10/15/2022  PCP: David Borg, MD  Patient coming from: Home  Chief Complaint: Drug overdose  HPI: David Kim is a 76 y.o. male with medical history significant of paraplegia, GERD, PUD, gout, hypertension, IBS, OSA, history of prostate cancer, severe MDD, persistent adjustment disorder with mixed anxiety and depressed mood.  Last month he was brought into the ED under IVC by family as patient was depressed and expressed wanting to die in the setting of recent death of his wife.  Psychiatry felt that the patient was not suicidal and he was cleared for discharge.  Psychiatry had recommended cutting down the dose of his home Klonopin from 4 mg to 2 mg but per documentation patient was adamant about taking his 4 mg dose.  Patient brought into the ED today by EMS as home health aide called the [place] department for suicidal ideation.  Home health aide told EMS that patient took a bunch of pills but could not state when or how much was taken.  EMS found bottles of losartan, tizanidine, oxybutynin, baclofen, and clonazepam.  Patient reported taking clonazepam and tizanidine but did not give any additional information.  Patient was febrile with EMS with temperature 103.2 F, tachycardic, and tachypneic.  Oxygen saturation 89% on room air with EMS.  Labs showing WBC 16.3, bicarb 19, glucose 100, BUN 35, creatinine 1.2 (baseline 0.7-0.9), calcium 8.1, albumin 3.3, AST 92, T. bili 1.6, ALT and alk phos normal, lactic acid normal x 2, blood cultures drawn, ammonia level normal, acetaminophen level <10, blood ethanol level <10, valproic acid level <10, SARS-CoV-2 PCR and influenza panel negative.  UA with positive nitrite, large amount of leukocytes, and microscopy showing >50 WBCs.  Urine culture pending.  Chest x-ray negative for acute finding. Patient was given ceftriaxone and 1 L normal saline bolus.  TRH called to  admit.  Patient states "I took a bunch of pills."  He is not able to tell me which medications exactly and how much he took.  Does mention clonazepam.  He did not answer any additional questions.  Review of Systems:  Review of Systems  All other systems reviewed and are negative.   Past Medical History:  Diagnosis Date   Allergic rhinitis    ALLERGIC RHINITIS 10/01/2007   Qualifier: Diagnosis of  By: David Reichmann MD, David Kim    Anxiety    Carpal tunnel syndrome    right   Depression    Depression    Erectile dysfunction    Fatigue    GERD (gastroesophageal reflux disease)    Gout    Hypertension    HYPOGONADISM 03/31/2008   Qualifier: Diagnosis of  By: David Reichmann MD, David Kim    IBS (irritable bowel syndrome)    Low back pain    OSA (obstructive sleep apnea)    Paraplegia ([place]) 04/23/2009   Qualifier: Diagnosis of  By: David Reichmann MD, David Kim    Peptic ulcer disease    PEPTIC ULCER DISEASE 10/01/2007   Qualifier: Diagnosis of  By: David Reichmann MD, David Kim    Prostate cancer [facility])    PROSTATE CANCER, HX OF 05/23/2007   Qualifier: Diagnosis of  By: David Reichmann MD, [place] PARALYSIS 04/23/2009   Qualifier: Diagnosis of  By: David Reichmann MD, David Kim    Thoracic spinal cord injury [facility]) 03/12/2012    Past Surgical History:  Procedure Laterality Date   ARTERY AND  TENDON REPAIR Left 09/06/2018   Procedure: ARTERY AND TENDON REPAIR;  Surgeon: Charlotte Crumb, MD;  Location: [facility];  Service: Orthopedics;  Laterality: Left;   CAST APPLICATION Left 28/01/6628   Procedure: CAST APPLICATION;  Surgeon: Charlotte Crumb, MD;  Location: [place];  Service: Orthopedics;  Laterality: Left;   inguinal heniorrhaphy     left leg surgery after fibula     NERVE REPAIR Left 09/06/2018   Procedure: NERVE REPAIR TIMES TWO;  Surgeon: Charlotte Crumb, MD;  Location: [place];  Service: Orthopedics;  Laterality: Left;   OPEN REDUCTION INTERNAL FIXATION (ORIF) DISTAL PHALANX Left 09/06/2018   Procedure: OPEN REDUCTION INTERNAL FIXATION  (ORIF) LEFT LONG FINGER;  Surgeon: Charlotte Crumb, MD;  Location: [place];  Service: Orthopedics;  Laterality: Left;   PILONIDAL CYST / SINUS EXCISION     PROSTATECTOMY     tonsillectomy     WOUND EXPLORATION Left 09/06/2018   Procedure: EXPLORATION OFCOMPLEX INJURY;  Surgeon: Charlotte Crumb, MD;  Location: [place];  Service: Orthopedics;  Laterality: Left;     reports that he has never smoked. He has never used smokeless tobacco. He reports that he does not drink alcohol and does not use drugs.  Allergies  Allergen Reactions   Amoxicillin Other (See Comments)    Unknown    Endal Hd Other (See Comments)    Unknown     Family History  Problem Relation Age of Onset   High blood pressure Mother    Dementia Mother    Diabetes Father     Prior to Admission medications   Medication Sig Start Date End Date Taking? Authorizing Provider  aspirin 81 MG EC tablet Take 81 mg by mouth daily.    [provider]  baclofen (LIORESAL) 20 MG tablet TAKE 1 TABLET(20 MG) BY MOUTH FOUR TIMES DAILY Patient taking differently: Take 20 mg by mouth 4 (four) times daily. 02/16/22   Meredith Staggers, MD  calcium citrate-vitamin D 500-500 MG-UNIT chewable tablet Chew 1 tablet by mouth daily. 10/01/19   Love, Ivan Anchors, PA-C  cholecalciferol (VITAMIN D3) 25 MCG (1000 UT) tablet Take 1,000 Units by mouth daily.    [provider]  citalopram (CELEXA) 10 MG tablet Take 1 tablet (10 mg total) by mouth daily. 09/08/22 09/08/23  David Borg, MD  clonazePAM (KLONOPIN) 2 MG tablet Take 1 tablet (2 mg total) by mouth at bedtime. 09/18/22   David Gant, NP  divalproex (DEPAKOTE ER) 500 MG 24 hr tablet Take 1 tablet (500 mg total) by mouth at bedtime. 09/18/22 10/18/22  David Gant, NP  losartan (COZAAR) 50 MG tablet TAKE 1 TABLET(50 MG) BY MOUTH TWICE DAILY Patient taking differently: Take 50 mg by mouth 2 (two) times daily. TAKE 1 TABLET(50 MG) BY MOUTH TWICE DAILY 01/31/22   David Borg, MD  Multiple Vitamin (MULTIVITAMIN) capsule Take 1 capsule by mouth daily.    [provider]  Multiple Vitamins-Minerals (ZINC PO) Take by mouth.    [provider]  oxybutynin (DITROPAN) 5 MG tablet Take 5 mg by mouth 4 (four) times daily.    [provider]  tiZANidine (ZANAFLEX) 4 MG tablet TAKE 1-2 TABLETS BY MOUTH FOUR TIMES DAILY Patient taking differently: Take 4 mg by mouth in the morning, at noon, in the evening, and at bedtime. 1-2 tablets po four times daily 09/04/22   Meredith Staggers, MD  vitamin C (ASCORBIC ACID) 500 MG tablet Take 500 mg by mouth  daily.    [provider]    Physical Exam: Vitals:   10/15/22 2030 10/15/22 2100 10/15/22 2130 10/15/22 2200  BP: 118/67 120/68 115/67 118/68  Pulse: (!) 107 (!) 102 96 100  Resp: (!) 26 (!) 25 18 (!) 26  Temp:      TempSrc:      SpO2: 94% 97% 97% 96%    Physical Exam Vitals reviewed.  Constitutional:      General: He is not in acute distress. HENT:     Head: Normocephalic and atraumatic.  Cardiovascular:     Rate and Rhythm: Normal rate and regular rhythm.     Pulses: Normal pulses.  Pulmonary:     Effort: Pulmonary effort is normal. No respiratory distress.     Breath sounds: Normal breath sounds. No wheezing or rales.  Abdominal:     General: Bowel sounds are normal. There is no distension.     Palpations: Abdomen is soft.     Tenderness: There is no abdominal tenderness.  Musculoskeletal:     Cervical back: Normal range of motion.     Right lower leg: No edema.     Left lower leg: No edema.  Skin:    General: Skin is warm and dry.  Neurological:     General: No focal deficit present.     Mental Status: He is alert and oriented to person, place, and time.     Labs on Admission: I have personally reviewed following labs and imaging studies  CBC: Recent Labs  Lab 10/15/22 1930  WBC 16.3*  NEUTROABS 14.6*  HGB 14.7  HCT 45.6  MCV 91.0  PLT 321   Basic  Metabolic Panel: Recent Labs  Lab 10/15/22 1930  NA 140  K 3.6  CL 108  CO2 19*  GLUCOSE 100*  BUN 35*  CREATININE 1.23  CALCIUM 8.1*   GFR: CrCl cannot be calculated (Unknown ideal weight.). Liver Function Tests: Recent Labs  Lab 10/15/22 1930  AST 92*  ALT 33  ALKPHOS 63  BILITOT 1.6*  PROT 6.5  ALBUMIN 3.3*   No results for input(s): "LIPASE", "AMYLASE" in the last 168 hours. Recent Labs  Lab 10/15/22 1930  AMMONIA 28   Coagulation Profile: Recent Labs  Lab 10/15/22 1930  INR 1.2   Cardiac Enzymes: No results for input(s): "CKTOTAL", "CKMB", "CKMBINDEX", "TROPONINI" in the last 168 hours. BNP (last 3 results) No results for input(s): "PROBNP" in the last 8760 hours. HbA1C: No results for input(s): "HGBA1C" in the last 72 hours. CBG: No results for input(s): "GLUCAP" in the last 168 hours. Lipid Profile: No results for input(s): "CHOL", "HDL", "LDLCALC", "TRIG", "CHOLHDL", "LDLDIRECT" in the last 72 hours. Thyroid Function Tests: No results for input(s): "TSH", "T4TOTAL", "FREET4", "T3FREE", "THYROIDAB" in the last 72 hours. Anemia Panel: No results for input(s): "VITAMINB12", "FOLATE", "FERRITIN", "TIBC", "IRON", "RETICCTPCT" in the last 72 hours. Urine analysis:    Component Value Date/Time   COLORURINE YELLOW 10/15/2022 2101   APPEARANCEUR TURBID (A) 10/15/2022 2101   LABSPEC 1.011 10/15/2022 2101   PHURINE 5.0 10/15/2022 2101   GLUCOSEU NEGATIVE 10/15/2022 2101   GLUCOSEU NEGATIVE 01/31/2022 1520   HGBUR MODERATE (A) 10/15/2022 2101   BILIRUBINUR NEGATIVE 10/15/2022 2101   KETONESUR 20 (A) 10/15/2022 2101   PROTEINUR 30 (A) 10/15/2022 2101   UROBILINOGEN 0.2 01/31/2022 1520   NITRITE POSITIVE (A) 10/15/2022 2101   LEUKOCYTESUR LARGE (A) 10/15/2022 2101    Radiological Exams on Admission: DG Chest Portable 1  View  Result Date: 10/15/2022 CLINICAL DATA:  Fever, drug overdose EXAM: PORTABLE CHEST 1 VIEW COMPARISON:  09/18/2022 FINDINGS:  Lungs are clear. No pneumothorax or pleural effusion. Stable cardiomegaly. Pulmonary vascularity is normal. No acute bone abnormality. IMPRESSION: 1. No active disease. Stable cardiomegaly. Electronically Signed   By: Fidela Salisbury M.D.   On: 10/15/2022 19:35    EKG: Independently reviewed.  Sinus tachycardia, QTc 467.  No significant change since prior tracing.  Assessment and Plan  Intentional drug overdose/likely suicide attempt History of severe MDD, persistent adjustment disorder with mixed anxiety and depressed mood It is not exactly clear which medications he overdosed on, what time these medications were taken, and what quantity was taken.  Per triage note, patient reported taking clonazepam and tizanidine.  At the time of my evaluation, he mentioned clonazepam but also stated he took "a bunch pills."  He is not giving any additional information at this time.  EMS found bottles of losartan, tizanidine, oxybutynin, baclofen, and clonazepam.  Acetaminophen level <10, blood ethanol level <10, valproic acid level <10.  EKG without acute changes. -Cardiac monitoring -IV fluid hydration -Hold all home medications at this time. -Psychiatry consulted -Suicide precautions  Sepsis secondary to UTI Patient presented with fever, tachycardia, and tachypnea.  WBC count 16.3. UA with positive nitrite, large amount of leukocytes, and microscopy showing >50 WBCs.  No lactic acidosis or hypotension.  Tachycardia has resolved with IV fluids. -Continue ceftriaxone -Continue IV fluid hydration -Tylenol as needed for fevers -Urine and blood cultures pending -Monitor WBC count  Acute hypoxemic respiratory failure Oxygen saturation 89% on room air with EMS, currently satting well on 2 L O2.  Lungs clear on exam and chest x-ray negative for acute finding.  Suspect mild hypoxia is due to benzodiazepine/muscle relaxer overdose.  PE less likely given no tachycardia. -Check D-dimer level -Continue supplemental  oxygen, wean as tolerated  Mild AKI BUN 35, creatinine 1.2 (baseline 0.7-0.9).  Concern for possible losartan overdose. -IV fluid hydration -Monitor renal function closely -Hold losartan and avoid any other nephrotoxic agents  Mild metabolic acidosis Likely due to AKI.  No lactic acidosis. -IV fluid hydration, continue to monitor closely and replace bicarb if acidosis worsens  Mild elevation of liver enzymes AST 92, T. bili 1.6, ALT and alk phos normal. Suspect hepatotoxicity from overdose of multiple medications.  Acetaminophen level and blood ethanol level undetectable. -Continue to monitor LFTs  GERD/PUD Gout Hypertension: Blood pressure stable -Holding all home medications at this time and monitoring closely.  DVT prophylaxis: Lovenox Code Status: Full Code Family Communication: No family available at this time. Consults called: Psychiatry Level of care: Progressive Care Unit Admission status: It is my clinical opinion that admission to INPATIENT is reasonable and necessary because of the expectation that this patient will require hospital care that crosses at least 2 midnights to treat this condition based on the medical complexity of the problems presented.  Given the aforementioned information, the predictability of an adverse outcome is felt to be significant.   Shela Leff MD [facility]  If 7PM-7AM, please contact night-coverage [email or web address]  10/15/2022, 10:26 PM

## 2022-10-16 ENCOUNTER — Inpatient Hospital Stay (HOSPITAL_COMMUNITY): Payer: Medicare Other

## 2022-10-16 ENCOUNTER — Encounter (HOSPITAL_COMMUNITY): Payer: Self-pay | Admitting: Internal Medicine

## 2022-10-16 DIAGNOSIS — R509 Fever, unspecified: Secondary | ICD-10-CM | POA: Diagnosis not present

## 2022-10-16 DIAGNOSIS — T50902A Poisoning by unspecified drugs, medicaments and biological substances, intentional self-harm, initial encounter: Secondary | ICD-10-CM | POA: Diagnosis not present

## 2022-10-16 DIAGNOSIS — N3000 Acute cystitis without hematuria: Secondary | ICD-10-CM

## 2022-10-16 DIAGNOSIS — R7881 Bacteremia: Secondary | ICD-10-CM | POA: Diagnosis not present

## 2022-10-16 DIAGNOSIS — R0902 Hypoxemia: Secondary | ICD-10-CM | POA: Diagnosis not present

## 2022-10-16 DIAGNOSIS — L8990 Pressure ulcer of unspecified site, unspecified stage: Secondary | ICD-10-CM | POA: Insufficient documentation

## 2022-10-16 DIAGNOSIS — B9620 Unspecified Escherichia coli [E. coli] as the cause of diseases classified elsewhere: Secondary | ICD-10-CM | POA: Diagnosis not present

## 2022-10-16 DIAGNOSIS — J9811 Atelectasis: Secondary | ICD-10-CM | POA: Diagnosis not present

## 2022-10-16 DIAGNOSIS — J9601 Acute respiratory failure with hypoxia: Secondary | ICD-10-CM | POA: Diagnosis not present

## 2022-10-16 DIAGNOSIS — F32A Depression, unspecified: Secondary | ICD-10-CM | POA: Diagnosis not present

## 2022-10-16 DIAGNOSIS — A419 Sepsis, unspecified organism: Secondary | ICD-10-CM | POA: Diagnosis not present

## 2022-10-16 DIAGNOSIS — L89152 Pressure ulcer of sacral region, stage 2: Secondary | ICD-10-CM | POA: Diagnosis present

## 2022-10-16 LAB — BLOOD CULTURE ID PANEL (REFLEXED) - BCID2

## 2022-10-16 LAB — CBC
HCT: 49 % (ref 39.0–52.0)
Hemoglobin: 15.5 g/dL (ref 13.0–17.0)
MCH: 29 pg (ref 26.0–34.0)
MCHC: 31.6 g/dL (ref 30.0–36.0)
MCV: 91.8 fL (ref 80.0–100.0)
Platelets: 239 10*3/uL (ref 150–400)
RBC: 5.34 MIL/uL (ref 4.22–5.81)
RDW: 14 % (ref 11.5–15.5)
WBC: 16.3 10*3/uL — ABNORMAL HIGH (ref 4.0–10.5)
nRBC: 0 % (ref 0.0–0.2)

## 2022-10-16 LAB — COMPREHENSIVE METABOLIC PANEL
ALT: 28 U/L (ref 0–44)
AST: 64 U/L — ABNORMAL HIGH (ref 15–41)
Albumin: 2.8 g/dL — ABNORMAL LOW (ref 3.5–5.0)
Alkaline Phosphatase: 58 U/L (ref 38–126)
Anion gap: 9 (ref 5–15)
BUN: 26 mg/dL — ABNORMAL HIGH (ref 8–23)
CO2: 19 mmol/L — ABNORMAL LOW (ref 22–32)
Calcium: 7.8 mg/dL — ABNORMAL LOW (ref 8.9–10.3)
Chloride: 112 mmol/L — ABNORMAL HIGH (ref 98–111)
Creatinine, Ser: 0.95 mg/dL (ref 0.61–1.24)
GFR, Estimated: >60 mL/min (ref >60)
Glucose, Bld: 124 mg/dL — ABNORMAL HIGH (ref 70–99)
Potassium: 3.7 mmol/L (ref 3.5–5.1)
Sodium: 140 mmol/L (ref 135–145)
Total Bilirubin: 1.2 mg/dL (ref 0.3–1.2)
Total Protein: 5.9 g/dL — ABNORMAL LOW (ref 6.5–8.1)

## 2022-10-16 LAB — D-DIMER, QUANTITATIVE: D-Dimer, Quant: 3.91 ug/mL-FEU — ABNORMAL HIGH (ref 0.00–0.50)

## 2022-10-16 LAB — SALICYLATE LEVEL: Salicylate Lvl: <7 mg/dL — ABNORMAL LOW (ref 7.0–30.0)

## 2022-10-16 MED ORDER — MEDIHONEY WOUND/BURN DRESSING EX PSTE
1.0000 | PASTE | Freq: Every day | CUTANEOUS | Status: DC
  Administered 2022-10-16 – 2022-11-17 (×32): 1 via TOPICAL
  Filled 2022-10-16 (×3): qty 44

## 2022-10-16 MED ORDER — SODIUM CHLORIDE 0.9 % IV SOLN
2.0000 g | INTRAVENOUS | Status: DC
  Administered 2022-10-16 – 2022-10-18 (×3): 2 g via INTRAVENOUS
  Filled 2022-10-16 (×3): qty 20

## 2022-10-16 MED ORDER — IOHEXOL 350 MG/ML SOLN
75.0000 mL | Freq: Once | INTRAVENOUS | Status: AC | PRN
  Administered 2022-10-16: 75 mL via INTRAVENOUS

## 2022-10-16 MED ORDER — LACTATED RINGERS IV SOLN: INTRAVENOUS | Status: DC

## 2022-10-16 NOTE — Progress Notes (Signed)
Progress Note    [name]   NUU:[id number]  DOB: Mar 19, 1946  DOA: 10/15/2022     1 PCP: Biagio Borg, MD  Initial CC: drug overdose  Hospital Course: [name] is a 76 yo male with PMH paraplegia, severe MDD, persistent adjustment disorder with mixed anxiety and depressed mood ,PUD, gout, HTN, IBS, OSA, prostate cancer, GERD.  He was recently evaluated under IVC last month after being depressed and expressing suicidal thoughts.  He was evaluated by psychiatry and cleared for discharge home with decrease in his Klonopin dose. He was brought in again this hospitalization after home health aide discovered patient took an unknown amount of pills with perceived suicidal intent. EMS found bottles of losartan, tizanidine, oxybutynin, baclofen, and clonazepam .  Appears the patient endorsed at least taking clonazepam and tizanidine but was not forthcoming with information on admission.   During evaluation, he was found to be febrile, 103.2, tachycardic, and tachypneic.  Mild hypoxia, 89% on room air with EMS. UA was notable for large LE, positive nitrite, greater than 50 WBC, few bacteria. Patient was unable to adequately elaborate on UTI symptoms therefore was started empirically on Rocephin on admission given concern for sepsis. Blood cultures were obtained and later returned positive with E. Coli in 1/4 bottles.   Psychiatry was also consulted on admission.  Interval History:  Sitter present. He is very lethargic/hypoactive appearing. Not sure if he can't answer questions or just doesn't want to, but he's both depressed appearing and just sick in general. Appears that blood cultures are now positive for E. Coli which explains some of the lethargy.   Assessment and Plan: * Drug overdose - known history of SI and attempt - presumed OD on multiple meds prior to admission, u/k amounts - continue sitter - follow up psych eval - currently no overt toxicity appreciated; continue tele and  holding most meds  Sepsis ([place]) - Febrile, tachycardia, tachypnea, leukocytosis.  Source suspected urinary with translocation to blood -Continue Rocephin - Follow-up cultures  UTI (urinary tract infection) - UA noted with large LE, positive nitrite, greater than 50 WBC, few bacteria - Follow-up urine culture - Blood cultures growing E. coli - Continue Rocephin  Bacteremia due to Escherichia coli - See sepsis.  Presumed translocation from urinary source - Continue Rocephin - Follow-up blood cultures  Acute hypoxemic respiratory failure (HCC)-resolved as of 10/16/2022 - suspect in setting of OD - no overt signs of aspiration of loss of consciousness - has been weaned back to RA  Metabolic acidosis - Etiology suspected due to overdose - Continue fluids - Trend BMP  Depression - follow up psych eval  Elevated liver enzymes - possibly in setting of sepsis vs OD - continue IVF and trend LFTs  GERD - Currently asymptomatic  Essential hypertension - Losartan on hold in setting of overdose - Continue trending blood pressure   Old records reviewed in assessment of this patient  Antimicrobials: Rocephin 12/10 >> current   DVT prophylaxis:  enoxaparin (LOVENOX) injection 40 mg Start: 10/16/22 1000   Code Status:   Code Status: Full Code  Mobility Assessment (last 72 hours)     Mobility Assessment     Row Name 10/16/22 0000           Does patient have an order for bedrest or is patient medically unstable No - Continue assessment       What is the highest level of mobility based on the progressive mobility assessment? Level 1 (  Bedfast) - Unable to balance while sitting on edge of bed       Is the above level different from baseline mobility prior to current illness? No - Consider discontinuing PT/OT                Barriers to discharge:  Disposition Plan:  Pending psych eval Status is: Inpt  Objective: Blood pressure 133/76, pulse 93, temperature 99.6  F (37.6 C), temperature source Oral, resp. rate (!) 25, height '5\' 8"'$  (1.727 m), weight 82.2 kg, SpO2 97 %.  Examination:  Physical Exam Constitutional:      Comments: Lethargic and depressed appearing but no distress  HENT:     Head: Normocephalic and atraumatic.     Mouth/Throat:     Mouth: Mucous membranes are dry.  Eyes:     Extraocular Movements: Extraocular movements intact.  Cardiovascular:     Rate and Rhythm: Normal rate and regular rhythm.  Pulmonary:     Effort: Pulmonary effort is normal.     Breath sounds: Normal breath sounds.  Abdominal:     General: Bowel sounds are normal. There is no distension.     Palpations: Abdomen is soft.     Tenderness: There is no abdominal tenderness.  Musculoskeletal:        General: Normal range of motion.     Cervical back: Normal range of motion and neck supple.  Skin:    General: Skin is warm and dry.  Neurological:     Comments: No obvious focal deficits.  Psychiatric:     Comments: Depressed mood      Consultants:  Psych   Procedures:    Data Reviewed: Results for orders placed or performed during the hospital encounter of 10/15/22 (from the past 24 hour(s))  Comprehensive metabolic panel     Status: Abnormal   Collection Time: 10/15/22  7:30 PM  Result Value Ref Range   Sodium 140 135 - 145 mmol/L   Potassium 3.6 3.5 - 5.1 mmol/L   Chloride 108 98 - 111 mmol/L   CO2 19 (L) 22 - 32 mmol/L   Glucose, Bld 100 (H) 70 - 99 mg/dL   BUN 35 (H) 8 - 23 mg/dL   Creatinine, Ser 1.23 0.61 - 1.24 mg/dL   Calcium 8.1 (L) 8.9 - 10.3 mg/dL   Total Protein 6.5 6.5 - 8.1 g/dL   Albumin 3.3 (L) 3.5 - 5.0 g/dL   AST 92 (H) 15 - 41 U/L   ALT 33 0 - 44 U/L   Alkaline Phosphatase 63 38 - 126 U/L   Total Bilirubin 1.6 (H) 0.3 - 1.2 mg/dL   GFR, Estimated >60 >60 mL/min   Anion gap 13 5 - 15  CBC with Differential     Status: Abnormal   Collection Time: 10/15/22  7:30 PM  Result Value Ref Range   WBC 16.3 (H) 4.0 - 10.5 K/uL    RBC 5.01 4.22 - 5.81 MIL/uL   Hemoglobin 14.7 13.0 - 17.0 g/dL   HCT 45.6 39.0 - 52.0 %   MCV 91.0 80.0 - 100.0 fL   MCH 29.3 26.0 - 34.0 pg   MCHC 32.2 30.0 - 36.0 g/dL   RDW 13.7 11.5 - 15.5 %   Platelets 246 150 - 400 K/uL   nRBC 0.0 0.0 - 0.2 %   Neutrophils Relative % 90 %   Neutro Abs 14.6 (H) 1.7 - 7.7 K/uL   Lymphocytes Relative 3 %   Lymphs Abs 0.5 (  L) 0.7 - 4.0 K/uL   Monocytes Relative 6 %   Monocytes Absolute 1.0 0.1 - 1.0 K/uL   Eosinophils Relative 0 %   Eosinophils Absolute 0.0 0.0 - 0.5 K/uL   Basophils Relative 0 %   Basophils Absolute 0.1 0.0 - 0.1 K/uL   Immature Granulocytes 1 %   Abs Immature Granulocytes 0.08 (H) 0.00 - 0.07 K/uL  Lactic acid, plasma     Status: None   Collection Time: 10/15/22  7:30 PM  Result Value Ref Range   Lactic Acid, Venous 1.4 0.5 - 1.9 mmol/L  Culture, blood (routine x 2)     Status: None (Preliminary result)   Collection Time: 10/15/22  7:30 PM   Specimen: BLOOD  Result Value Ref Range   Specimen Description      BLOOD LEFT ANTECUBITAL Performed at [facility], [place] [address], [place], [place] [address]    Special Requests      BOTTLES DRAWN AEROBIC AND ANAEROBIC Blood Culture adequate volume Performed at [facility] [address] [place], [place] [address]    Culture      NO GROWTH < 12 HOURS Performed at [facility] [address] [place], [place] [address]    Report Status PENDING   Ammonia     Status: None   Collection Time: 10/15/22  7:30 PM  Result Value Ref Range   Ammonia 28 9 - 35 umol/L  Acetaminophen level     Status: Abnormal   Collection Time: 10/15/22  7:30 PM  Result Value Ref Range   Acetaminophen (Tylenol), Serum <10 (L) 10 - 30 ug/mL  Protime-INR     Status: None   Collection Time: 10/15/22  7:30 PM  Result Value Ref Range   Prothrombin Time 15.2 11.4 - 15.2 seconds   INR 1.2 0.8 - 1.2  Ethanol     Status: None   Collection Time: 10/15/22   7:30 PM  Result Value Ref Range   Alcohol, Ethyl (B) <10 <10 mg/dL  Valproic acid level     Status: Abnormal   Collection Time: 10/15/22  7:30 PM  Result Value Ref Range   Valproic Acid Lvl <10 (L) 50.0 - 100.0 ug/mL  Resp panel by RT-PCR (RSV, Flu A&B, Covid) Anterior Nasal Swab     Status: None   Collection Time: 10/15/22  8:11 PM   Specimen: Anterior Nasal Swab  Result Value Ref Range   SARS Coronavirus 2 by RT PCR NEGATIVE NEGATIVE   Influenza A by PCR NEGATIVE NEGATIVE   Influenza B by PCR NEGATIVE NEGATIVE   Resp Syncytial Virus by PCR NEGATIVE NEGATIVE  Urinalysis, Routine w reflex microscopic Urine, Catheterized     Status: Abnormal   Collection Time: 10/15/22  9:01 PM  Result Value Ref Range   Color, Urine YELLOW YELLOW   APPearance TURBID (A) CLEAR   Specific Gravity, Urine 1.011 1.005 - 1.030   pH 5.0 5.0 - 8.0   Glucose, UA NEGATIVE NEGATIVE mg/dL   Hgb urine dipstick MODERATE (A) NEGATIVE   Bilirubin Urine NEGATIVE NEGATIVE   Ketones, ur 20 (A) NEGATIVE mg/dL   Protein, ur 30 (A) NEGATIVE mg/dL   Nitrite POSITIVE (A) NEGATIVE   Leukocytes,Ua LARGE (A) NEGATIVE   RBC / HPF 11-20 0 - 5 RBC/hpf   WBC, UA >50 (H) 0 - 5 WBC/hpf   Bacteria, UA FEW (A) NONE SEEN   WBC Clumps PRESENT    Mucus PRESENT  Lactic acid, plasma     Status: None   Collection Time: 10/15/22  9:33 PM  Result Value Ref Range   Lactic Acid, Venous 0.9 0.5 - 1.9 mmol/L  Culture, blood (routine x 2)     Status: None (Preliminary result)   Collection Time: 10/15/22  9:34 PM   Specimen: BLOOD  Result Value Ref Range   Specimen Description      BLOOD BLOOD RIGHT FOREARM Performed at [facility] [address], [place], [place] [address]    Special Requests      BOTTLES DRAWN AEROBIC AND ANAEROBIC Blood Culture adequate volume Performed at [facility] [address] [place], [place] [address]    Culture  Setup Time      GRAM NEGATIVE RODS ANAEROBIC  BOTTLE ONLY Organism ID to follow CRITICAL RESULT CALLED TO, READ BACK BY AND VERIFIED WITH:  C/ PHARMD D. WOFFORD 10/16/22 1305 A. LAFRANCE Performed at [facility], [place] [address], [place], [place] [address]    Culture GRAM NEGATIVE RODS    Report Status PENDING   Blood Culture ID Panel (Reflexed)     Status: Abnormal   Collection Time: 10/15/22  9:34 PM  Result Value Ref Range   Enterococcus faecalis NOT DETECTED NOT DETECTED   Enterococcus Faecium NOT DETECTED NOT DETECTED   Listeria monocytogenes NOT DETECTED NOT DETECTED   Staphylococcus species NOT DETECTED NOT DETECTED   Staphylococcus aureus (BCID) NOT DETECTED NOT DETECTED   Staphylococcus epidermidis NOT DETECTED NOT DETECTED   Staphylococcus lugdunensis NOT DETECTED NOT DETECTED   Streptococcus species NOT DETECTED NOT DETECTED   Streptococcus agalactiae NOT DETECTED NOT DETECTED   Streptococcus pneumoniae NOT DETECTED NOT DETECTED   Streptococcus pyogenes NOT DETECTED NOT DETECTED   A.calcoaceticus-baumannii NOT DETECTED NOT DETECTED   Bacteroides fragilis NOT DETECTED NOT DETECTED   Enterobacterales DETECTED (A) NOT DETECTED   Enterobacter cloacae complex NOT DETECTED NOT DETECTED   Escherichia coli DETECTED (A) NOT DETECTED   Klebsiella aerogenes NOT DETECTED NOT DETECTED   Klebsiella oxytoca NOT DETECTED NOT DETECTED   Klebsiella pneumoniae NOT DETECTED NOT DETECTED   Proteus species NOT DETECTED NOT DETECTED   Salmonella species NOT DETECTED NOT DETECTED   Serratia marcescens NOT DETECTED NOT DETECTED   Haemophilus influenzae NOT DETECTED NOT DETECTED   Neisseria meningitidis NOT DETECTED NOT DETECTED   Pseudomonas aeruginosa NOT DETECTED NOT DETECTED   Stenotrophomonas maltophilia NOT DETECTED NOT DETECTED   Candida albicans NOT DETECTED NOT DETECTED   Candida auris NOT DETECTED NOT DETECTED   Candida glabrata NOT DETECTED NOT DETECTED   Candida krusei NOT DETECTED NOT DETECTED   Candida parapsilosis  NOT DETECTED NOT DETECTED   Candida tropicalis NOT DETECTED NOT DETECTED   Cryptococcus neoformans/gattii NOT DETECTED NOT DETECTED   CTX-M ESBL NOT DETECTED NOT DETECTED   Carbapenem resistance IMP NOT DETECTED NOT DETECTED   Carbapenem resistance KPC NOT DETECTED NOT DETECTED   Carbapenem resistance NDM NOT DETECTED NOT DETECTED   Carbapenem resist OXA 48 LIKE NOT DETECTED NOT DETECTED   Carbapenem resistance VIM NOT DETECTED NOT DETECTED  CBC     Status: Abnormal   Collection Time: 10/16/22 12:55 AM  Result Value Ref Range   WBC 16.3 (H) 4.0 - 10.5 K/uL   RBC 5.34 4.22 - 5.81 MIL/uL   Hemoglobin 15.5 13.0 - 17.0 g/dL   HCT 49.0 39.0 - 52.0 %   MCV 91.8 80.0 - 100.0 fL   MCH 29.0  26.0 - 34.0 pg   MCHC 31.6 30.0 - 36.0 g/dL   RDW 14.0 11.5 - 15.5 %   Platelets 239 150 - 400 K/uL   nRBC 0.0 0.0 - 0.2 %  D-dimer, quantitative     Status: Abnormal   Collection Time: 10/16/22 12:55 AM  Result Value Ref Range   D-Dimer, Quant 3.91 (H) 0.00 - 0.50 ug/mL-FEU  Comprehensive metabolic panel     Status: Abnormal   Collection Time: 10/16/22  7:24 AM  Result Value Ref Range   Sodium 140 135 - 145 mmol/L   Potassium 3.7 3.5 - 5.1 mmol/L   Chloride 112 (H) 98 - 111 mmol/L   CO2 19 (L) 22 - 32 mmol/L   Glucose, Bld 124 (H) 70 - 99 mg/dL   BUN 26 (H) 8 - 23 mg/dL   Creatinine, Ser 0.95 0.61 - 1.24 mg/dL   Calcium 7.8 (L) 8.9 - 10.3 mg/dL   Total Protein 5.9 (L) 6.5 - 8.1 g/dL   Albumin 2.8 (L) 3.5 - 5.0 g/dL   AST 64 (H) 15 - 41 U/L   ALT 28 0 - 44 U/L   Alkaline Phosphatase 58 38 - 126 U/L   Total Bilirubin 1.2 0.3 - 1.2 mg/dL   GFR, Estimated >60 >60 mL/min   Anion gap 9 5 - 15  Salicylate level     Status: Abnormal   Collection Time: 10/16/22 10:52 AM  Result Value Ref Range   Salicylate Lvl <0.2 (L) 7.0 - 30.0 mg/dL    I have Reviewed nursing notes, Vitals, and Lab results since pt's last encounter. Pertinent lab results : see above I have ordered test including BMP, CBC,  Mg I have reviewed the last note from staff over past 24 hours I have discussed pt's care plan and test results with nursing staff, case manager  Time spent: Greater than 50% of the 55 minute visit was spent in counseling/coordination of care for the patient as laid out in the A&P.    LOS: 1 day   Dwyane Dee, MD [facility] 10/16/2022, 2:29 PM

## 2022-10-16 NOTE — Assessment & Plan Note (Addendum)
Titrating amlodipine to 10 mg once daily - BP is well controlled PRN intravenous antihypertensives for excessively elevated blood pressure

## 2022-10-16 NOTE — Assessment & Plan Note (Signed)
-   possibly in setting of sepsis vs OD - continue IVF and trend LFTs

## 2022-10-16 NOTE — Assessment & Plan Note (Addendum)
Antibiotic course completed early the morning of 12/19.

## 2022-10-16 NOTE — Assessment & Plan Note (Addendum)
Psychiatry following although patient is still suffering from some element of encephalopathy.  Their input is appreciated. Had a long discussion with patient's niece, [name] on 12/12 and again today (12/19).  Patient has become severely depressed since his wife passed away in 2023-09-05.  She was essentially the patient's sole caretaker and he was completely dependent on her.  Since her passing, he has began "shutting down"

## 2022-10-16 NOTE — Assessment & Plan Note (Signed)
-   suspect in setting of OD - no overt signs of aspiration of loss of consciousness - has been weaned back to RA

## 2022-10-16 NOTE — Progress Notes (Signed)
Update:  D-dimer positive.  GFR >60.  Stat CTA chest ordered to rule out PE.

## 2022-10-16 NOTE — Assessment & Plan Note (Signed)
Etiology suspected due to overdose Resolved

## 2022-10-16 NOTE — Assessment & Plan Note (Signed)
-   UA noted with large LE, positive nitrite, greater than 50 WBC, few bacteria - Follow-up urine culture - Blood cultures growing E. coli - Continue Rocephin

## 2022-10-16 NOTE — Hospital Course (Addendum)
13 yom w/ paraplegia, severe MDD, persistent adjustment disorder with mixed anxiety and depressed mood ,PUD, gout, HTN, IBS, OSA, prostate cancer, GERD who was recently evaluated under IVC last month after being depressed and expressing suicidal ideation,was treated by psychiatry on their service and cleared for discharge home with decrease in his Klonopin dose on 11/13 but he presented to [facility] long hospital ED after home health aide discovered patient took an unknown amount of pills.   In ED- EMS revealed there were opened bottles of losartan, tizanidine, oxybutynin, baclofen, and clonazepam. Upon initial interview the patient endorsed at least taking clonazepam and tizanidine but would not provide additional information.  Initial evaluation also revealed multiple SIRS criteria with fever of 103.2 and urinalysis suggestive of urinary tract infection prompting initiation of intravenous ceftriaxone.  Patient was initially hospitalized in the stepdown unit for suspected drug overdose with initial concern for sepsis secondary to urinary tract infection.  Urine culture came back positive for pansensitive E. coli several days after hospitalization patient was also identified to have persisting left lower lobe infiltrate on 12/14 thought to be due to concurrent pneumonia.  Antibiotics were therefore broadened to cefepime and vancomycin on 12/14.  Clinical evidence of SIRS/sepsis resolved in the days that followed and course of therapy completed early in the morning on 12/19.  After several days of withholding all of the suspected overdosed agents patient began to exhibit extreme agitation tremulousness and hallucinations concerning for benzodiazepine withdrawal.  Patient was then placed back on scheduled benzodiazepines and baclofen on 12/13.    From 12/13 until 12/19 patient's mentation gradually improved. Degree of agitation and confusion also gradually began to improve.  Extensive encephalopathy workup was  undertaken including obtaining CT head, MRI brain and EEG all of which did not identify a contributing cause.  Psychiatry was also consulted on admission however their assessments have been obscured by ongoing encephalopathy felt to be due to both infection and drug overdose.  Due to guarded prognosis palliative care consultation was additionally obtained.

## 2022-10-16 NOTE — Consult Note (Addendum)
[facility] Nurse Consult Note: Reason for Consult: Consult requested for sacrum wound. Pt is frequently incontinent of stool and rectum is in close proximity to the sacrum wound so it is difficult to keep the wound from becoming soiled.  Wound type: Unstageable pressure injury; 100% yellow slough, 5X4cm, small amt yellow drainage, no odor or fluctuance.  Pressure Injury POA: Yes Dressing procedure/placement/frequency: Topical treatment orders provided for bedside nurses to perform as follows to assist with removal of nonviable tissue: Apply Medihoney to sacrum wound Q day, then cover with foam dressing. (Change foam dressing Q 3 days or PRN soiling.) Please re-consult if further assistance is needed.  Thank-you,  Julien Girt MSN, [place], [place], [place], [place]

## 2022-10-16 NOTE — Assessment & Plan Note (Addendum)
Will now transition to oral Protonix

## 2022-10-16 NOTE — Consult Note (Signed)
  Patient seen and attempted to assess, however patient unable to participate in psychiatric evaluation.  Patient is able to answer question for his name, however the majority of his evaluation was very mumbled/trouble/Non sensical speech.  Patient with much difficulty speaking. He is unable to recall the recent events leading up to this admission with the exception of "it started over a mountain dew. "  Psychiatry will continue to follow, and attempt to reassess tomorrow. Suspect polypharmacy is inhibiting his ability to participate.  -Continue safety and suicide precautions.  Continue one-to-one.  In the event patient attempts to leave will need to be placed under IVC.

## 2022-10-16 NOTE — Assessment & Plan Note (Addendum)
Sepsis thought to be secondary to E. coli urinary tract infection on admission.  Then, several days later  left lower lobe infiltrate noted on chest x-ray 12/14 as an additional source. Antibiotic regimen was expanded from ceftriaxone monotherapy to cefepime and vancomycin on 12/14.  Antibiotics completed early the morning of 12/19. SIRS have resolved Repeat blood cultures negative.

## 2022-10-16 NOTE — Assessment & Plan Note (Addendum)
Suspected intentional overdose, found to have open bottles of losartan, tizanidine, oxybutynin, baclofen and clonazepam by EMS.  Quantity is unclear.   Originally, all of patient's home medications were initially held due to the overdose  Patient was then resumed on benzodiazepines and baclofen on 12/13 due to concerns for withdrawal and have been slowly tapered since Continue sitter one-to-one safety precautions Will place IVC paperwork if patient attempts to elope. Psychiatry following, is assisting with management of delirium.  Their assistance is appreciated. Monitoring closely with serial labs and ongoing telemetry monitoring

## 2022-10-16 NOTE — Progress Notes (Signed)
PHARMACY - PHYSICIAN COMMUNICATION CRITICAL VALUE ALERT - BLOOD CULTURE IDENTIFICATION (BCID)  David Kim is an 76 y.o. male who presented to [facility] on 10/15/2022 with a chief complaint of overdose. Found to have UTI which is currently being treated  Assessment: 1/4 BCx bottles growing E coli (no resistance) - suspect urinary source  Name of physician (or Provider) Contacted: [place]  Current antibiotics: CTX 1g IV q24  Changes to prescribed antibiotics recommended: Increase CTX to 2g daily Recommendations accepted by provider  Results for orders placed or performed during the hospital encounter of 10/15/22  Blood Culture ID Panel (Reflexed) (Collected: 10/15/2022  9:34 PM)  Result Value Ref Range   Enterococcus faecalis NOT DETECTED NOT DETECTED   Enterococcus Faecium NOT DETECTED NOT DETECTED   Listeria monocytogenes NOT DETECTED NOT DETECTED   Staphylococcus species NOT DETECTED NOT DETECTED   Staphylococcus aureus (BCID) NOT DETECTED NOT DETECTED   Staphylococcus epidermidis NOT DETECTED NOT DETECTED   Staphylococcus lugdunensis NOT DETECTED NOT DETECTED   Streptococcus species NOT DETECTED NOT DETECTED   Streptococcus agalactiae NOT DETECTED NOT DETECTED   Streptococcus pneumoniae NOT DETECTED NOT DETECTED   Streptococcus pyogenes NOT DETECTED NOT DETECTED   A.calcoaceticus-baumannii NOT DETECTED NOT DETECTED   Bacteroides fragilis NOT DETECTED NOT DETECTED   Enterobacterales DETECTED (A) NOT DETECTED   Enterobacter cloacae complex NOT DETECTED NOT DETECTED   Escherichia coli DETECTED (A) NOT DETECTED   Klebsiella aerogenes NOT DETECTED NOT DETECTED   Klebsiella oxytoca NOT DETECTED NOT DETECTED   Klebsiella pneumoniae NOT DETECTED NOT DETECTED   Proteus species NOT DETECTED NOT DETECTED   Salmonella species NOT DETECTED NOT DETECTED   Serratia marcescens NOT DETECTED NOT DETECTED   Haemophilus influenzae NOT DETECTED NOT DETECTED   Neisseria meningitidis NOT  DETECTED NOT DETECTED   Pseudomonas aeruginosa NOT DETECTED NOT DETECTED   Stenotrophomonas maltophilia NOT DETECTED NOT DETECTED   Candida albicans NOT DETECTED NOT DETECTED   Candida auris NOT DETECTED NOT DETECTED   Candida glabrata NOT DETECTED NOT DETECTED   Candida krusei NOT DETECTED NOT DETECTED   Candida parapsilosis NOT DETECTED NOT DETECTED   Candida tropicalis NOT DETECTED NOT DETECTED   Cryptococcus neoformans/gattii NOT DETECTED NOT DETECTED   CTX-M ESBL NOT DETECTED NOT DETECTED   Carbapenem resistance IMP NOT DETECTED NOT DETECTED   Carbapenem resistance KPC NOT DETECTED NOT DETECTED   Carbapenem resistance NDM NOT DETECTED NOT DETECTED   Carbapenem resist OXA 48 LIKE NOT DETECTED NOT DETECTED   Carbapenem resistance VIM NOT DETECTED NOT DETECTED    Josefa [place] A 10/16/2022  1:09 PM

## 2022-10-17 ENCOUNTER — Inpatient Hospital Stay (HOSPITAL_COMMUNITY): Payer: Medicare Other

## 2022-10-17 ENCOUNTER — Encounter (HOSPITAL_COMMUNITY)
Admission: EM | Disposition: A | Discharge status: Skilled Nursing Facility | Payer: Self-pay | Source: Home / Self Care | Attending: Internal Medicine

## 2022-10-17 DIAGNOSIS — A419 Sepsis, unspecified organism: Secondary | ICD-10-CM | POA: Diagnosis not present

## 2022-10-17 DIAGNOSIS — B9620 Unspecified Escherichia coli [E. coli] as the cause of diseases classified elsewhere: Secondary | ICD-10-CM | POA: Diagnosis not present

## 2022-10-17 DIAGNOSIS — K625 Hemorrhage of anus and rectum: Secondary | ICD-10-CM

## 2022-10-17 DIAGNOSIS — K6289 Other specified diseases of anus and rectum: Secondary | ICD-10-CM | POA: Diagnosis not present

## 2022-10-17 DIAGNOSIS — K5289 Other specified noninfective gastroenteritis and colitis: Secondary | ICD-10-CM | POA: Diagnosis not present

## 2022-10-17 DIAGNOSIS — K5900 Constipation, unspecified: Secondary | ICD-10-CM

## 2022-10-17 DIAGNOSIS — K922 Gastrointestinal hemorrhage, unspecified: Secondary | ICD-10-CM | POA: Diagnosis not present

## 2022-10-17 DIAGNOSIS — T50902A Poisoning by unspecified drugs, medicaments and biological substances, intentional self-harm, initial encounter: Secondary | ICD-10-CM | POA: Diagnosis not present

## 2022-10-17 DIAGNOSIS — N3000 Acute cystitis without hematuria: Secondary | ICD-10-CM | POA: Diagnosis not present

## 2022-10-17 DIAGNOSIS — J9601 Acute respiratory failure with hypoxia: Secondary | ICD-10-CM | POA: Diagnosis not present

## 2022-10-17 DIAGNOSIS — S72141K Displaced intertrochanteric fracture of right femur, subsequent encounter for closed fracture with nonunion: Secondary | ICD-10-CM | POA: Diagnosis not present

## 2022-10-17 DIAGNOSIS — R7881 Bacteremia: Secondary | ICD-10-CM | POA: Diagnosis not present

## 2022-10-17 DIAGNOSIS — M4856XA Collapsed vertebra, not elsewhere classified, lumbar region, initial encounter for fracture: Secondary | ICD-10-CM | POA: Diagnosis not present

## 2022-10-17 DIAGNOSIS — D62 Acute posthemorrhagic anemia: Secondary | ICD-10-CM | POA: Diagnosis not present

## 2022-10-17 LAB — CBC WITH DIFFERENTIAL/PLATELET
Abs Immature Granulocytes: 0.1 10*3/uL — ABNORMAL HIGH (ref 0.00–0.07)
Basophils Absolute: 0.1 10*3/uL (ref 0.0–0.1)
Basophils Relative: 1 %
Eosinophils Absolute: 0 10*3/uL (ref 0.0–0.5)
Eosinophils Relative: 0 %
HCT: 42.5 % (ref 39.0–52.0)
Hemoglobin: 13.6 g/dL (ref 13.0–17.0)
Immature Granulocytes: 1 %
Lymphocytes Relative: 9 %
Lymphs Abs: 1.1 10*3/uL (ref 0.7–4.0)
MCH: 28.9 pg (ref 26.0–34.0)
MCHC: 32 g/dL (ref 30.0–36.0)
MCV: 90.4 fL (ref 80.0–100.0)
Monocytes Absolute: 1.1 10*3/uL — ABNORMAL HIGH (ref 0.1–1.0)
Monocytes Relative: 10 %
Neutro Abs: 9.2 10*3/uL — ABNORMAL HIGH (ref 1.7–7.7)
Neutrophils Relative %: 79 %
Platelets: 275 10*3/uL (ref 150–400)
RBC: 4.7 MIL/uL (ref 4.22–5.81)
RDW: 14 % (ref 11.5–15.5)
WBC: 11.6 10*3/uL — ABNORMAL HIGH (ref 4.0–10.5)
nRBC: 0 % (ref 0.0–0.2)

## 2022-10-17 LAB — BASIC METABOLIC PANEL
Anion gap: 14 (ref 5–15)
BUN: 23 mg/dL (ref 8–23)
CO2: 18 mmol/L — ABNORMAL LOW (ref 22–32)
Calcium: 8.1 mg/dL — ABNORMAL LOW (ref 8.9–10.3)
Chloride: 107 mmol/L (ref 98–111)
Creatinine, Ser: 0.84 mg/dL (ref 0.61–1.24)
GFR, Estimated: >60 mL/min (ref >60)
Glucose, Bld: 145 mg/dL — ABNORMAL HIGH (ref 70–99)
Potassium: 3.3 mmol/L — ABNORMAL LOW (ref 3.5–5.1)
Sodium: 139 mmol/L (ref 135–145)

## 2022-10-17 LAB — PREPARE RBC (CROSSMATCH)

## 2022-10-17 LAB — HEMOGLOBIN AND HEMATOCRIT, BLOOD
HCT: 41.7 % (ref 39.0–52.0)
HCT: 43 % (ref 39.0–52.0)
Hemoglobin: 13.6 g/dL (ref 13.0–17.0)
Hemoglobin: 13.6 g/dL (ref 13.0–17.0)

## 2022-10-17 LAB — MAGNESIUM: Magnesium: 2 mg/dL (ref 1.7–2.4)

## 2022-10-17 SURGERY — SIGMOIDOSCOPY, FLEXIBLE
Anesthesia: Monitor Anesthesia Care

## 2022-10-17 MED ORDER — ORAL CARE MOUTH RINSE
15.0000 mL | OROMUCOSAL | Status: DC
  Administered 2022-10-18 – 2022-10-21 (×13): 15 mL via OROMUCOSAL

## 2022-10-17 MED ORDER — POTASSIUM CHLORIDE CRYS ER 20 MEQ PO TBCR: 40.0000 meq | EXTENDED_RELEASE_TABLET | Freq: Once | ORAL | Status: DC

## 2022-10-17 MED ORDER — SODIUM CHLORIDE 0.9% IV SOLUTION: Freq: Once | INTRAVENOUS | Status: AC

## 2022-10-17 MED ORDER — CHLORHEXIDINE GLUCONATE CLOTH 2 % EX PADS
6.0000 | MEDICATED_PAD | Freq: Every day | CUTANEOUS | Status: DC
  Administered 2022-10-19 – 2022-10-20 (×3): 6 via TOPICAL

## 2022-10-17 MED ORDER — IOHEXOL 350 MG/ML SOLN
100.0000 mL | Freq: Once | INTRAVENOUS | Status: AC | PRN
  Administered 2022-10-17: 100 mL via INTRAVENOUS

## 2022-10-17 MED ORDER — IOHEXOL 350 MG/ML SOLN
100.0000 mL | Freq: Once | INTRAVENOUS | Status: AC | PRN
  Administered 2022-10-17: 150 mL via INTRAVENOUS

## 2022-10-17 MED ORDER — ACETAMINOPHEN 10 MG/ML IV SOLN
1000.0000 mg | Freq: Once | INTRAVENOUS | Status: AC
  Administered 2022-10-17: 1000 mg via INTRAVENOUS
  Filled 2022-10-17: qty 100

## 2022-10-17 MED ORDER — SENNOSIDES-DOCUSATE SODIUM 8.6-50 MG PO TABS
1.0000 | ORAL_TABLET | Freq: Two times a day (BID) | ORAL | Status: DC
  Administered 2022-10-20 – 2022-11-17 (×41): 1 via ORAL
  Filled 2022-10-17 (×47): qty 1

## 2022-10-17 MED ORDER — POLYETHYLENE GLYCOL 3350 17 G PO PACK: 17.0000 g | PACK | Freq: Every day | ORAL | Status: DC

## 2022-10-17 MED ORDER — MAGNESIUM CITRATE PO SOLN
1.0000 | Freq: Once | ORAL | Status: DC
  Filled 2022-10-17: qty 296

## 2022-10-17 MED ORDER — TRAZODONE HCL 50 MG PO TABS
50.0000 mg | ORAL_TABLET | Freq: Every day | ORAL | Status: DC
  Administered 2022-10-19 – 2022-10-20 (×2): 50 mg via ORAL
  Filled 2022-10-17 (×3): qty 1

## 2022-10-17 MED ORDER — POLYVINYL ALCOHOL 1.4 % OP SOLN
1.0000 [drp] | OPHTHALMIC | Status: DC | PRN
  Administered 2022-10-17: 1 [drp] via OPHTHALMIC
  Filled 2022-10-17: qty 15

## 2022-10-17 MED ORDER — METRONIDAZOLE 500 MG/100ML IV SOLN
500.0000 mg | Freq: Two times a day (BID) | INTRAVENOUS | Status: DC
  Administered 2022-10-17: 500 mg via INTRAVENOUS
  Filled 2022-10-17: qty 100

## 2022-10-17 MED ORDER — ORAL CARE MOUTH RINSE: 15.0000 mL | OROMUCOSAL | Status: DC | PRN

## 2022-10-17 MED ORDER — LACTATED RINGERS IV BOLUS
1000.0000 mL | Freq: Once | INTRAVENOUS | Status: AC
  Administered 2022-10-17: 1000 mL via INTRAVENOUS

## 2022-10-17 NOTE — Progress Notes (Signed)
HR in the 160 s, Olena Heckle NP notified.

## 2022-10-17 NOTE — Progress Notes (Signed)
Patient just had large amount of bright red blood from the rectum. Hgb dropped from 15.5 to 13.6 this morning. Olena Heckle NP notified and he ordered H & H.

## 2022-10-17 NOTE — Progress Notes (Addendum)
Progress Note    David Kim   GHW:[id number]  DOB: 1946-02-27  DOA: 10/15/2022     2 PCP: Biagio Borg, MD  Initial CC: drug overdose  Hospital Course: [name] is a 76 yo male with PMH paraplegia, severe MDD, persistent adjustment disorder with mixed anxiety and depressed mood ,PUD, gout, HTN, IBS, OSA, prostate cancer, GERD.  He was recently evaluated under IVC last month after being depressed and expressing suicidal thoughts.  He was evaluated by psychiatry and cleared for discharge home with decrease in his Klonopin dose. He was brought in again this hospitalization after home health aide discovered patient took an unknown amount of pills with perceived suicidal intent. EMS found bottles of losartan, tizanidine, oxybutynin, baclofen, and clonazepam .  Appears the patient endorsed at least taking clonazepam and tizanidine but was not forthcoming with information on admission.   During evaluation, he was found to be febrile, 103.2, tachycardic, and tachypneic.  Mild hypoxia, 89% on room air with EMS. UA was notable for large LE, positive nitrite, greater than 50 WBC, few bacteria. Patient was unable to adequately elaborate on UTI symptoms therefore was started empirically on Rocephin on admission given concern for sepsis. Blood cultures were obtained and later returned positive with E. Coli in 1/4 bottles.   Psychiatry was also consulted on admission.  Interval History:  Episode of rectal bleeding overnight.  Bleeding scan this morning showed nothing other than sterile coral colitis.  He was in his same usual state as seen yesterday, mostly nonverbal but answering some yes/no questions and denied any pain or issues this morning.  This afternoon he had recurrent rectal bleeding rather severe and witnessed personally.  Bright red blood noted to be almost constantly oozing out.  Emergent blood ordered along with fluid bolus. Updated his niece, David Kim who I spoke with this morning as  well; she has also talked with family and elected for changing to DNR.  Patient will undergo repeat CTA this afternoon and further plan to be determined thereafter with assistance of GI. Transfer orders placed to stepdown.  Assessment and Plan: * Drug overdose - known history of SI and attempt - presumed OD on multiple meds prior to admission, u/k amounts - continue sitter - follow up psych eval - currently no overt toxicity appreciated; continue tele and holding most meds  GIB (gastrointestinal bleeding) - episode of BRBPR overnight of 12/12; performed CTA morning of 12/12 with only findings of stercoral colits; patient had no abd pain on exam in morning and was in same mental state as yesterday - in the afternoon patient became febrile and tachycardic followed by recurrent BRBPR and hypotension; 1 unit emergent blood as blood was witnessed to be oozing out and 1 L LR bolus - GI consulted for further rec's. Repeat CTA bleeding scan ordered - niece David Kim updated (she's main point of contact and has talked to rest of family too) and they understand gravity of situation and guarded prognosis; they've also decided to change code status to DNR in case he were to further decompensate especially given poor QOL at baseline - patient to transfer to SDU  Sepsis ([place]) - Febrile, tachycardia, tachypnea, leukocytosis.  Source suspected urinary with translocation to blood -Continue Rocephin - Follow-up cultures  UTI (urinary tract infection) - UA noted with large LE, positive nitrite, greater than 50 WBC, few bacteria - Follow-up urine culture - Blood cultures growing E. coli - Continue Rocephin  Bacteremia due to Escherichia coli -  See sepsis.  Presumed translocation from urinary source - Continue Rocephin - Follow-up blood cultures  Constipation - Severe history of constipation.  His niece was telling me that he was dependent on his wife for rectal stimulation.  When he has gone to  rehab in the past, they would not continue this and he develops severe constipation during these times. -CT obtained shows stercoral colitis which fits with his history of severe constipation.  Rectal bleeding he had may be due to this as well - Aggressive laxative regimen ordered, may still need manual disimpaction.  Given his underlying paraplegia, suspect he has neurogenic bowel  Acute hypoxemic respiratory failure (HCC)-resolved as of 10/16/2022 - suspect in setting of OD - no overt signs of aspiration of loss of consciousness - has been weaned back to RA  Metabolic acidosis - Etiology suspected due to overdose - Continue fluids - Trend BMP  Depression - follow up psych eval -Spoke with his niece, David Kim on 12/12 also; she states patient has become severely depressed since his wife passed away approximately 6 weeks ago.  She was essentially the patient's sole caretaker and he was completely dependent on her.  Since her passing, he has began "shutting down"  Elevated liver enzymes - possibly in setting of sepsis vs OD - continue IVF and trend LFTs  GERD - Currently asymptomatic  Essential hypertension - Losartan on hold in setting of overdose - Continue trending blood pressure  Paraplegia ([place]) - at baseline   Old records reviewed in assessment of this patient  Antimicrobials: Rocephin 12/10 >> current   DVT prophylaxis:  enoxaparin (LOVENOX) injection 40 mg Start: 10/16/22 1000   Code Status:   Code Status: DNR  Mobility Assessment (last 72 hours)     Mobility Assessment     Row Name 10/16/22 2149 10/16/22 0000         Does patient have an order for bedrest or is patient medically unstable No - Continue assessment No - Continue assessment      What is the highest level of mobility based on the progressive mobility assessment? Level 1 (Bedfast) - Unable to balance while sitting on edge of bed Level 1 (Bedfast) - Unable to balance while sitting on edge of  bed      Is the above level different from baseline mobility prior to current illness? Yes - Recommend PT order No - Consider discontinuing PT/OT               Barriers to discharge:  Disposition Plan:  Pending psych eval Status is: Inpt  Objective: Blood pressure (!) 172/139, pulse (!) 146, temperature (!) 103.2 F (39.6 C), temperature source Oral, resp. rate (!) 31, height '5\' 8"'$  (1.727 m), weight 82.2 kg, SpO2 90 %.  Examination:  Physical Exam Constitutional:      Comments: Lethargic and depressed appearing but no distress  HENT:     Head: Normocephalic and atraumatic.     Mouth/Throat:     Mouth: Mucous membranes are dry.  Eyes:     Extraocular Movements: Extraocular movements intact.  Cardiovascular:     Rate and Rhythm: Normal rate and regular rhythm.  Pulmonary:     Effort: Pulmonary effort is normal.     Breath sounds: Normal breath sounds.  Abdominal:     General: Bowel sounds are normal. There is no distension.     Palpations: Abdomen is soft.     Tenderness: There is no abdominal tenderness.  Musculoskeletal:  General: Normal range of motion.     Cervical back: Normal range of motion and neck supple.  Skin:    General: Skin is warm and dry.  Neurological:     Comments: No obvious focal deficits.  Psychiatric:     Comments: Depressed mood      Consultants:  Psych  GI  Procedures:    Data Reviewed: Results for orders placed or performed during the hospital encounter of 10/15/22 (from the past 24 hour(s))  Basic metabolic panel     Status: Abnormal   Collection Time: 10/17/22  3:47 AM  Result Value Ref Range   Sodium 139 135 - 145 mmol/L   Potassium 3.3 (L) 3.5 - 5.1 mmol/L   Chloride 107 98 - 111 mmol/L   CO2 18 (L) 22 - 32 mmol/L   Glucose, Bld 145 (H) 70 - 99 mg/dL   BUN 23 8 - 23 mg/dL   Creatinine, Ser 0.84 0.61 - 1.24 mg/dL   Calcium 8.1 (L) 8.9 - 10.3 mg/dL   GFR, Estimated >60 >60 mL/min   Anion gap 14 5 - 15  CBC with  Differential/Platelet     Status: Abnormal   Collection Time: 10/17/22  3:47 AM  Result Value Ref Range   WBC 11.6 (H) 4.0 - 10.5 K/uL   RBC 4.70 4.22 - 5.81 MIL/uL   Hemoglobin 13.6 13.0 - 17.0 g/dL   HCT 42.5 39.0 - 52.0 %   MCV 90.4 80.0 - 100.0 fL   MCH 28.9 26.0 - 34.0 pg   MCHC 32.0 30.0 - 36.0 g/dL   RDW 14.0 11.5 - 15.5 %   Platelets 275 150 - 400 K/uL   nRBC 0.0 0.0 - 0.2 %   Neutrophils Relative % 79 %   Neutro Abs 9.2 (H) 1.7 - 7.7 K/uL   Lymphocytes Relative 9 %   Lymphs Abs 1.1 0.7 - 4.0 K/uL   Monocytes Relative 10 %   Monocytes Absolute 1.1 (H) 0.1 - 1.0 K/uL   Eosinophils Relative 0 %   Eosinophils Absolute 0.0 0.0 - 0.5 K/uL   Basophils Relative 1 %   Basophils Absolute 0.1 0.0 - 0.1 K/uL   Immature Granulocytes 1 %   Abs Immature Granulocytes 0.10 (H) 0.00 - 0.07 K/uL  Magnesium     Status: None   Collection Time: 10/17/22  3:47 AM  Result Value Ref Range   Magnesium 2.0 1.7 - 2.4 mg/dL  Hemoglobin and hematocrit, blood     Status: None   Collection Time: 10/17/22  7:19 AM  Result Value Ref Range   Hemoglobin 13.6 13.0 - 17.0 g/dL   HCT 41.7 39.0 - 52.0 %  Type and screen [place]     Status: None (Preliminary result)   Collection Time: 10/17/22  3:43 PM  Result Value Ref Range   ABO/RH(D) PENDING    Antibody Screen PENDING    Sample Expiration 10/20/2022,2359    Unit Number [id number]    Blood Component Type RED CELLS,LR    Unit division 00    Status of Unit ISSUED    Unit tag comment EMERGENCY RELEASE    Transfusion Status      OK TO TRANSFUSE Performed at [facility], [address], [place], [place] [address]    Crossmatch Result PENDING    Unit Number [id number]    Blood Component Type RED CELLS,LR    Unit division 00    Status of Unit ISSUED  Unit tag comment EMERGENCY RELEASE    Transfusion Status OK TO TRANSFUSE    Crossmatch Result PENDING   Hemoglobin and hematocrit, blood      Status: None   Collection Time: 10/17/22  3:49 PM  Result Value Ref Range   Hemoglobin 13.6 13.0 - 17.0 g/dL   HCT 43.0 39.0 - 52.0 %    I have Reviewed nursing notes, Vitals, and Lab results since pt's last encounter. Pertinent lab results : see above I have ordered test including BMP, CBC, Mg I have reviewed the last note from staff over past 24 hours I have discussed pt's care plan and test results with nursing staff, case manager   Critical care time to evaluate and treat this patient was 40 minutes.  Independent of separate billable services  This patient is critically ill with the following life-threatening issues requiring my presence at the bedside: Hemodynamic instability requiring titration of medications Oxygenation/ventilation instability requiring frequent modifications of support Cardiac rhythm disturbances requiring evaluation and/or interventions Fluctuations in neurologic function requiring evaluation and/or interventions and/or fluid/volume titration     LOS: 2 days   Dwyane Dee, MD [facility] 10/17/2022, 4:42 PM

## 2022-10-17 NOTE — Assessment & Plan Note (Addendum)
history of severe constipation.   In the past, patient's wife was primarily involved in performing regular rectal stimulation and disimpactions prior to her death Aggressive laxative regimen ordered with MiraLAX senna and Colace with patient now moving his bowels regularly CT imaging suggestive of stercoral colitis  Given his underlying paraplegia, suspect he has neurogenic bowel Disimpactions as needed

## 2022-10-17 NOTE — Consult Note (Signed)
  Patient seen and attempted to assess, however patient remains encephalopathic.  Patient is able to answer question for his name, and reason for coming into the hospital "I tried to commit suicide. I been feeling this way since my wife died." He tells me that she died 09-04-1972. He then reports the date she passed was 10/13/1947. Attempted to obtain clarification as the significance of this date and he reports [facility]. Patient is unable to answer any additional questions, despite ample and appropriate time to respond. His speech and thought processes remained delayed and is incoherent. Other times during the evaluation he was observed to be picking at things in the air, and picking things from his gown. He would not respond when asking or identify if he saw things. Of note he is responding to external stimuli.  Patient with much difficulty speaking.   Psychiatry will continue to follow, and attempt to reassess tomorrow. Suspect polypharmacy and infection are impacting his ability to participate.  -Delirium precautions -Continue safety and suicide precautions.  Continue one-to-one.  In the event patient attempts to leave will need to be placed under IVC.

## 2022-10-17 NOTE — Assessment & Plan Note (Addendum)
Episode of bright red blood per rectum on 12/12 followed by additional small episode morning of 12/13  Patient's status post 2 unit packed blood cell transfusion on 12/12.   GI Consulted, they felt  that susspected etiology is due to stercoral colitis with ulceration No evidence of GI bleeding since with stable hemoglobin and hematocrit.
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
## 2022-10-17 NOTE — Assessment & Plan Note (Signed)
Frequent turns to minimize development of pressure ulcerations [place] cautions

## 2022-10-17 NOTE — Consult Note (Signed)
Consultation  Referring Provider: No ref. provider found Primary Care Physician:  Biagio Borg, MD Primary Gastroenterologist: Remote Dr. Deatra Ina 2011  Reason for Consultation: GI bleed  HPI: David Kim is a 76 y.o. male who was admitted through emergency room on 10/15/2022, after an intentional overdose, possible ingestion of losartan twice Inadine oxybutynin baclofen and clonazepam.  Patient was unsure how many or exactly what he had taken per ER notes.  Patient paraplegic at baseline and febrile on admission to 102.5 He has history of chronic GERD, hypertension, sleep apnea, paraplegia, prostate cancer and remote peptic ulcer disease.  Also with history of depression/anxiety His wife apparently died about 6 weeks ago. Patient is had persistent intermittent fevers, tachycardia and tachypnea. UA was positive on admission/greater than 50 WBCs He has been covered with Rocephin with concerns for underlying bacteremia and blood cultures returned positive for E. coli in 1 of 4 bottles. He has been somewhat lethargic and hypoactive since admission.  At about 630 this morning he had a large amount of bright red blood per rectum acutely and hemoglobin dropped from 15.5-13.6. Around 3 PM this afternoon he had an episode of a large amount of recurrent rectal bleeding with large maroon stool and clot per nursing seem to continue to ooze. He has been more lethargic, very tachycardic to the 140s, and now has a fever of 103  Had undergone CTA earlier this morning after the initial episode of bleeding-this showed several subcentimeter low-attenuation lesions present scattered throughout the liver no evidence of enhancement on arterial or portal venous phase imaging too small for accurate characterization.  Bladder sludge versus stones, there is a region of linear striated enhancement in the left interpolar kidney concerning for focal pyelonephritis no hydronephrosis that are distended and diffusely  thick-walled-no evidence of active GI bleeding, there is circumferential rectal thickening with small stool ball in place.  Consider sterile coral colitis but no other focal bowel wall thickening  Labs this a.m. WBC 11.6/hemoglobin 13.6/hematocrit 42.5 Potassium 3.3 BUN 23/creatinine 0.84  Hemoglobin at 3:30 PM 13.6/hematocrit 43.0  He is currently being transferred to stepdown, plan is to transfuse, stabilize and repeat stat CTA   Dr .Roosvelt Harps has spoken with patient's niece, and he will be made DNR in the event of arrest   Past Medical History:  Diagnosis Date   Allergic rhinitis    ALLERGIC RHINITIS 10/01/2007   Qualifier: Diagnosis of  By: Jenny Reichmann MD, [place] tunnel syndrome    right   Depression    Depression    Erectile dysfunction    Fatigue    GERD (gastroesophageal reflux disease)    Gout    Hypertension    HYPOGONADISM 03/31/2008   Qualifier: Diagnosis of  By: Jenny Reichmann MD, Hunt Oris    IBS (irritable bowel syndrome)    Low back pain    OSA (obstructive sleep apnea)    Paraplegia ([place]) 04/23/2009   Qualifier: Diagnosis of  By: Jenny Reichmann MD, Hunt Oris    Peptic ulcer disease    PEPTIC ULCER DISEASE 10/01/2007   Qualifier: Diagnosis of  By: Jenny Reichmann MD, Hunt Oris    Prostate cancer [facility])    PROSTATE CANCER, HX OF 05/23/2007   Qualifier: Diagnosis of  By: Jenny Reichmann MD, [place] PARALYSIS 04/23/2009   Qualifier: Diagnosis of  By: Jenny Reichmann MD, Hunt Oris    Thoracic spinal cord injury ([place]) 03/12/2012    Past  Surgical History:  Procedure Laterality Date   ARTERY AND TENDON REPAIR Left 09/06/2018   Procedure: ARTERY AND TENDON REPAIR;  Surgeon: Charlotte Crumb, MD;  Location: [place];  Service: Orthopedics;  Laterality: Left;   CAST APPLICATION Left 61/02/4314   Procedure: CAST APPLICATION;  Surgeon: Charlotte Crumb, MD;  Location: [place];  Service: Orthopedics;  Laterality: Left;   inguinal heniorrhaphy     left leg surgery after fibula     NERVE REPAIR Left 09/06/2018    Procedure: NERVE REPAIR TIMES TWO;  Surgeon: Charlotte Crumb, MD;  Location: [place];  Service: Orthopedics;  Laterality: Left;   OPEN REDUCTION INTERNAL FIXATION (ORIF) DISTAL PHALANX Left 09/06/2018   Procedure: OPEN REDUCTION INTERNAL FIXATION (ORIF) LEFT LONG FINGER;  Surgeon: Charlotte Crumb, MD;  Location: [place];  Service: Orthopedics;  Laterality: Left;   PILONIDAL CYST / SINUS EXCISION     PROSTATECTOMY     tonsillectomy     WOUND EXPLORATION Left 09/06/2018   Procedure: EXPLORATION OFCOMPLEX INJURY;  Surgeon: Charlotte Crumb, MD;  Location: [place];  Service: Orthopedics;  Laterality: Left;    Prior to Admission medications   Medication Sig Start Date End Date Taking? Authorizing Provider  aspirin 81 MG EC tablet Take 81 mg by mouth daily.    [provider]  baclofen (LIORESAL) 20 MG tablet TAKE 1 TABLET(20 MG) BY MOUTH FOUR TIMES DAILY Patient taking differently: Take 20 mg by mouth 4 (four) times daily. 02/16/22   Meredith Staggers, MD  calcium citrate-vitamin D 500-500 MG-UNIT chewable tablet Chew 1 tablet by mouth daily. 10/01/19   Love, Ivan Anchors, PA-C  cholecalciferol (VITAMIN D3) 25 MCG (1000 UT) tablet Take 1,000 Units by mouth daily.    [provider]  citalopram (CELEXA) 10 MG tablet Take 1 tablet (10 mg total) by mouth daily. 09/08/22 09/08/23  Biagio Borg, MD  clonazePAM (KLONOPIN) 2 MG tablet Take 1 tablet (2 mg total) by mouth at bedtime. 09/18/22   Delfin Gant, NP  divalproex (DEPAKOTE ER) 500 MG 24 hr tablet Take 1 tablet (500 mg total) by mouth at bedtime. 09/18/22 10/18/22  Delfin Gant, NP  losartan (COZAAR) 50 MG tablet TAKE 1 TABLET(50 MG) BY MOUTH TWICE DAILY Patient taking differently: Take 50 mg by mouth 2 (two) times daily. TAKE 1 TABLET(50 MG) BY MOUTH TWICE DAILY 01/31/22   Biagio Borg, MD  Multiple Vitamin (MULTIVITAMIN) capsule Take 1 capsule by mouth daily.    [provider]  Multiple Vitamins-Minerals (ZINC  PO) Take by mouth.    [provider]  oxybutynin (DITROPAN) 5 MG tablet Take 5 mg by mouth 4 (four) times daily.    [provider]  tiZANidine (ZANAFLEX) 4 MG tablet TAKE 1-2 TABLETS BY MOUTH FOUR TIMES DAILY Patient taking differently: Take 4 mg by mouth in the morning, at noon, in the evening, and at bedtime. 1-2 tablets po four times daily 09/04/22   Meredith Staggers, MD  vitamin C (ASCORBIC ACID) 500 MG tablet Take 500 mg by mouth daily.    [provider]    Current Facility-Administered Medications  Medication Dose Route Frequency Provider Last Rate Last Admin   acetaminophen (OFIRMEV) IV 1,000 mg  1,000 mg Intravenous Once Dwyane Dee, MD       acetaminophen (TYLENOL) tablet 650 mg  650 mg Oral Q6H PRN Shela Leff, MD   650 mg at 10/17/22 0452   Or   acetaminophen (TYLENOL) suppository 650 mg  650 mg Rectal Q6H PRN Shela Leff, MD   650 mg at 10/17/22 1526   cefTRIAXone (ROCEPHIN) 2 g in sodium chloride 0.9 % 100 mL IVPB  2 g Intravenous Q24H Polly Cobia, RPH 200 mL/hr at 10/16/22 2154 2 g at 10/16/22 2154   enoxaparin (LOVENOX) injection 40 mg  40 mg Subcutaneous Q24H Shela Leff, MD   40 mg at 10/16/22 0934   iohexol (OMNIPAQUE) 350 MG/ML injection 100 mL  100 mL Intravenous Once PRN Dwyane Dee, MD       lactated ringers infusion   Intravenous Continuous Dwyane Dee, MD 75 mL/hr at 10/17/22 0122 New Bag at 10/17/22 0122   leptospermum manuka honey (MEDIHONEY) paste 1 Application  1 Application Topical Daily Dwyane Dee, MD   1 Application at 70/26/37 1017   magnesium citrate solution 1 Bottle  1 Bottle Oral Once Dwyane Dee, MD       polyethylene glycol (MIRALAX / GLYCOLAX) packet 17 g  17 g Oral Daily Dwyane Dee, MD       potassium chloride SA (KLOR-CON M) CR tablet 40 mEq  40 mEq Oral Once Dwyane Dee, MD       senna-docusate (Senokot-S) tablet 1 tablet  1 tablet Oral BID Dwyane Dee, MD       traZODone  (DESYREL) tablet 50 mg  50 mg Oral Standley Brooking, MD        Allergies as of 10/15/2022 - Review Complete 10/15/2022  Allergen Reaction Noted   Amoxicillin Other (See Comments) 05/23/2007   Endal hd Other (See Comments) 05/23/2007    Family History  Problem Relation Age of Onset   High blood pressure Mother    Dementia Mother    Diabetes Father     Social History   Socioeconomic History   Marital status: Married    Spouse name: [name]   Number of children: 0   Years of education: 12   Highest education level: Not on file  Occupational History   Occupation: retired    Fish farm manager: RETIRED  Tobacco Use   Smoking status: Never   Smokeless tobacco: Never  Vaping Use   Vaping Use: Never used  Substance and Sexual Activity   Alcohol use: No   Drug use: No   Sexual activity: Not on file  Other Topics Concern   Not on file  Social History Narrative   Patient lives at home with [name]). Patient is disabled. Patient has 12 th grade education.   Right handed.   Caffeine- None   Social Determinants of Health   Financial Resource Strain: Low Risk  (07/24/2022)   Overall Financial Resource Strain (CARDIA)    Difficulty of Paying Living Expenses: Not hard at all  Food Insecurity: No Food Insecurity (07/24/2022)   Hunger Vital Sign    Worried About Running Out of Food in the Last Year: Never true    Ran Out of Food in the Last Year: Never true  Transportation Needs: No Transportation Needs (07/24/2022)   PRAPARE - Hydrologist (Medical): No    Lack of Transportation (Non-Medical): No  Physical Activity: Inactive (07/24/2022)   Exercise Vital Sign    Days of Exercise per Week: 0 days    Minutes of Exercise per Session: 0 min  Stress: No Stress Concern Present (07/24/2022)   [place]    Feeling of Stress : Not at all  Social Connections: Moderately Isolated (  07/24/2022)    Social Connection and Isolation Panel [NHANES]    Frequency of Communication with Friends and Family: More than three times a week    Frequency of Social Gatherings with Friends and Family: More than three times a week    Attends Religious Services: Never    Marine scientist or Organizations: No    Attends Archivist Meetings: Never    Marital Status: Married  Human resources officer Violence: Not At Risk (07/24/2022)   Humiliation, Afraid, Rape, and Kick questionnaire    Fear of Current or Ex-Partner: No    Emotionally Abused: No    Physically Abused: No    Sexually Abused: No    Review of Systems:  Pt unable to offer  Physical Exam: Vital signs in last 24 hours: Temp:  [99.9 F (37.7 C)-103.2 F (39.6 C)] 103.2 F (39.6 C) (12/12 1608) Pulse Rate:  [106-146] 146 (12/12 1608) Resp:  [20-31] 31 (12/12 1608) BP: (101-178)/(59-139) 172/139 (12/12 1608) SpO2:  [88 %-97 %] 90 % (12/12 1628) Last BM Date : 10/16/22 General:   Alert,  Well-developed, acutely ill older white male pleasant and cooperative in NAD, not verbally responsive currently, having rigors/temp 103 Head:  Normocephalic and atraumatic. Eyes:  Sclera clear, no icterus.   Conjunctiva pink. Ears:  Normal auditory acuity. Nose:  No deformity, discharge,  or lesions. Mouth:  No deformity or lesions.   Neck:  Supple; no masses or thyromegaly. Lungs:  Clear throughout to auscultation.  Tachypnea, increased respiratory effort heart:  Regular rate and rhythm; no murmurs, clicks, rubs,  or gallops. Abdomen:  Soft,nontender, BS active,nonpalp mass or hsm.   Rectal: Not done, picture per patient's nurse with large dark red bowel movement and large clot at 3:30 PM Msk:  Symmetrical without gross deformities. . Pulses:  Normal pulses noted. Extremities:  Without clubbing or edema. Neurologic:  Alert , eyes open, not responding to commands at present Skin:  Intact without significant lesions or  rashes..   Intake/Output from previous day: 12/11 0701 - 12/12 0700 In: 2899.7 [P.O.:1080; I.V.:1819.7] Out: 1100 [Urine:1100] Intake/Output this shift: Total I/O In: 240 [P.O.:240] Out: -   Lab Results: Recent Labs    10/15/22 1930 10/16/22 0055 10/17/22 0347 10/17/22 0719 10/17/22 1549  WBC 16.3* 16.3* 11.6*  --   --   HGB 14.7 15.5 13.6 13.6 13.6  HCT 45.6 49.0 42.5 41.7 43.0  PLT 246 239 275  --   --    BMET Recent Labs    10/15/22 1930 10/16/22 0724 10/17/22 0347  NA 140 140 139  K 3.6 3.7 3.3*  CL 108 112* 107  CO2 19* 19* 18*  GLUCOSE 100* 124* 145*  BUN 35* 26* 23  CREATININE 1.23 0.95 0.84  CALCIUM 8.1* 7.8* 8.1*   LFT Recent Labs    10/16/22 0724  PROT 5.9*  ALBUMIN 2.8*  AST 64*  ALT 28  ALKPHOS 58  BILITOT 1.2   PT/INR Recent Labs    10/15/22 1930  LABPROT 15.2  INR 1.2   Hepatitis Panel No results for input(s): "HEPBSAG", "HCVAB", "HEPAIGM", "HEPBIGM" in the last 72 hours.    IMPRESSION:  #40 76 year old white male with acute lower GI bleeding onset this morning dark red bloody stool and clots CTA this a.m. negative for active bleeding, findings consistent with circumferential thickening of the rectal wall/stercoral colitis, stool ball in the rectum  Recurrent acute bleed this afternoon larger amount of dark red blood and clot.  Has been associated with decrease in responsiveness, tachycardia in the 140s, and fever to 103.  Etiology of the rectal bleeding is not entirely clear though suspect a stercoral ulcer with acute hemorrhage.  #2 E.coli bacteremia, on antibiotics since admission-urinary source TA today did show findings concerning for focal pyelonephritis on the left and a thick walled bladder Concern currently for worsening sepsis change in mental status, fever, tachycardia tachypnea  #3 admitted with intentional drug overdose/suicide attempt-multiple prescription medications  #4 paraplegia #5 prostate cancer #6.  Sleep  apnea #7.  Remote history of peptic ulcer disease per chart #8 depression/anxiety #9 constipation related to above-setting of underlying chronic constipation  PLAN: N.p.o. Stop Lovenox Transfer to stepdown in progress Transfuse 1 unit packed RBCs Repeat stat CTA-if positive proceed to arteriogram and embolization Patient is very poor candidate for sedation at present, however if CTA is negative and bleeding continues he may require bedside flexible sigmoidoscopy tonight to attempt to control bleeding  Serial hemoglobins every 6 hours As above Dr. Stasia Cavalier have spoken with the patient's niece and he will be DNR with plans to continue aggressive management otherwise.    Maurita Havener PA-C 10/17/2022, 5:01 PM

## 2022-10-18 DIAGNOSIS — D62 Acute posthemorrhagic anemia: Secondary | ICD-10-CM | POA: Diagnosis not present

## 2022-10-18 DIAGNOSIS — K5909 Other constipation: Secondary | ICD-10-CM

## 2022-10-18 DIAGNOSIS — A419 Sepsis, unspecified organism: Secondary | ICD-10-CM | POA: Diagnosis not present

## 2022-10-18 DIAGNOSIS — E872 Acidosis, unspecified: Secondary | ICD-10-CM

## 2022-10-18 DIAGNOSIS — Z7189 Other specified counseling: Secondary | ICD-10-CM

## 2022-10-18 DIAGNOSIS — G928 Other toxic encephalopathy: Secondary | ICD-10-CM | POA: Diagnosis not present

## 2022-10-18 DIAGNOSIS — F332 Major depressive disorder, recurrent severe without psychotic features: Secondary | ICD-10-CM | POA: Diagnosis not present

## 2022-10-18 DIAGNOSIS — I10 Essential (primary) hypertension: Secondary | ICD-10-CM | POA: Diagnosis not present

## 2022-10-18 DIAGNOSIS — K626 Ulcer of anus and rectum: Secondary | ICD-10-CM | POA: Diagnosis not present

## 2022-10-18 DIAGNOSIS — R7881 Bacteremia: Secondary | ICD-10-CM | POA: Diagnosis not present

## 2022-10-18 DIAGNOSIS — K5289 Other specified noninfective gastroenteritis and colitis: Secondary | ICD-10-CM

## 2022-10-18 DIAGNOSIS — K922 Gastrointestinal hemorrhage, unspecified: Secondary | ICD-10-CM | POA: Diagnosis not present

## 2022-10-18 DIAGNOSIS — E876 Hypokalemia: Secondary | ICD-10-CM | POA: Diagnosis not present

## 2022-10-18 DIAGNOSIS — R933 Abnormal findings on diagnostic imaging of other parts of digestive tract: Secondary | ICD-10-CM

## 2022-10-18 DIAGNOSIS — L89152 Pressure ulcer of sacral region, stage 2: Secondary | ICD-10-CM | POA: Diagnosis not present

## 2022-10-18 DIAGNOSIS — N390 Urinary tract infection, site not specified: Secondary | ICD-10-CM

## 2022-10-18 DIAGNOSIS — T50902A Poisoning by unspecified drugs, medicaments and biological substances, intentional self-harm, initial encounter: Secondary | ICD-10-CM | POA: Diagnosis not present

## 2022-10-18 LAB — CBC WITH DIFFERENTIAL/PLATELET
Abs Immature Granulocytes: 0.12 10*3/uL — ABNORMAL HIGH (ref 0.00–0.07)
Basophils Absolute: 0.1 10*3/uL (ref 0.0–0.1)
Basophils Relative: 0 %
Eosinophils Absolute: 0.1 10*3/uL (ref 0.0–0.5)
Eosinophils Relative: 0 %
HCT: 41.5 % (ref 39.0–52.0)
Hemoglobin: 13.3 g/dL (ref 13.0–17.0)
Immature Granulocytes: 1 %
Lymphocytes Relative: 9 %
Lymphs Abs: 1.5 10*3/uL (ref 0.7–4.0)
MCH: 29.2 pg (ref 26.0–34.0)
MCHC: 32 g/dL (ref 30.0–36.0)
MCV: 91 fL (ref 80.0–100.0)
Monocytes Absolute: 1.2 10*3/uL — ABNORMAL HIGH (ref 0.1–1.0)
Monocytes Relative: 7 %
Neutro Abs: 13.9 10*3/uL — ABNORMAL HIGH (ref 1.7–7.7)
Neutrophils Relative %: 83 %
Platelets: 207 10*3/uL (ref 150–400)
RBC: 4.56 MIL/uL (ref 4.22–5.81)
RDW: 14 % (ref 11.5–15.5)
WBC: 16.8 10*3/uL — ABNORMAL HIGH (ref 4.0–10.5)
nRBC: 0 % (ref 0.0–0.2)

## 2022-10-18 LAB — HEMOGLOBIN AND HEMATOCRIT, BLOOD
HCT: 40.2 % (ref 39.0–52.0)
HCT: 45.1 % (ref 39.0–52.0)
Hemoglobin: 13.2 g/dL (ref 13.0–17.0)
Hemoglobin: 14.5 g/dL (ref 13.0–17.0)

## 2022-10-18 LAB — URINE CULTURE: Culture: >=100000 — AB

## 2022-10-18 LAB — CULTURE, BLOOD (ROUTINE X 2): Special Requests: ADEQUATE

## 2022-10-18 LAB — BASIC METABOLIC PANEL
Anion gap: 9 (ref 5–15)
BUN: 18 mg/dL (ref 8–23)
CO2: 23 mmol/L (ref 22–32)
Calcium: 7.8 mg/dL — ABNORMAL LOW (ref 8.9–10.3)
Chloride: 108 mmol/L (ref 98–111)
Creatinine, Ser: 0.8 mg/dL (ref 0.61–1.24)
GFR, Estimated: >60 mL/min (ref >60)
Glucose, Bld: 122 mg/dL — ABNORMAL HIGH (ref 70–99)
Potassium: 3.2 mmol/L — ABNORMAL LOW (ref 3.5–5.1)
Sodium: 140 mmol/L (ref 135–145)

## 2022-10-18 LAB — MAGNESIUM: Magnesium: 2 mg/dL (ref 1.7–2.4)

## 2022-10-18 LAB — MRSA NEXT GEN BY PCR, NASAL: MRSA by PCR Next Gen: NOT DETECTED

## 2022-10-18 MED ORDER — POLYETHYLENE GLYCOL 3350 17 G PO PACK
17.0000 g | PACK | Freq: Two times a day (BID) | ORAL | Status: DC
  Administered 2022-10-20 – 2022-11-17 (×35): 17 g via ORAL
  Filled 2022-10-18 (×42): qty 1

## 2022-10-18 MED ORDER — PANTOPRAZOLE SODIUM 40 MG IV SOLR
40.0000 mg | INTRAVENOUS | Status: DC
  Administered 2022-10-18 – 2022-10-20 (×3): 40 mg via INTRAVENOUS
  Filled 2022-10-18 (×4): qty 10

## 2022-10-18 MED ORDER — HYDRALAZINE HCL 20 MG/ML IJ SOLN: 10.0000 mg | Freq: Four times a day (QID) | INTRAMUSCULAR | Status: DC | PRN

## 2022-10-18 MED ORDER — POTASSIUM CHLORIDE 10 MEQ/100ML IV SOLN
10.0000 meq | INTRAVENOUS | Status: AC
  Administered 2022-10-18 (×4): 10 meq via INTRAVENOUS
  Filled 2022-10-18 (×4): qty 100

## 2022-10-18 MED ORDER — LORAZEPAM 2 MG/ML IJ SOLN: 2.0000 mg | Freq: Once | INTRAMUSCULAR | Status: AC

## 2022-10-18 MED ORDER — LORAZEPAM 2 MG/ML IJ SOLN
INTRAMUSCULAR | Status: AC
  Administered 2022-10-18: 2 mg via INTRAVENOUS
  Filled 2022-10-18: qty 1

## 2022-10-18 NOTE — Assessment & Plan Note (Signed)
Present on admission Daily dressing changes Frequent turns

## 2022-10-18 NOTE — Progress Notes (Addendum)
PROGRESS NOTE   David Kim  WIO:[id number] DOB: [birth year] DOA: 10/15/2022 PCP: Biagio Borg, MD   Date of Service: the patient was seen and examined on 10/18/2022  Brief Narrative:  Mr. Hulbert is a 76 yo male with PMH paraplegia, severe MDD, persistent adjustment disorder with mixed anxiety and depressed mood ,PUD, gout, HTN, IBS, OSA, prostate cancer, GERD.  He was recently evaluated under IVC last month after being depressed and expressing suicidal thoughts.  He was evaluated by psychiatry and cleared for discharge home with decrease in his Klonopin dose.  Patient presented to [facility] emergency department after home health aide discovered patient took an unknown amount of pills.    During the patient's emergency department evaluation, discussions with EMS revealed there were opened bottles of losartan, tizanidine, oxybutynin, baclofen, and clonazepam.  Upon initial interview the patient endorsed at least taking clonazepam and tizanidine but would not provide additional information.  Initial evaluation also revealed multiple SIRS criteria with fever of 103.2 and urinalysis suggestive of urinary tract infection prompting initiation of intravenous ceftriaxone.  Patient was initially hospitalized in the stepdown unit for suspected drug overdose with signs concerning for sepsis secondary to urinary tract infection.  Shortly after hospitalization 1 of 4 blood culture bottles came back positive for E. Coli with urine culture additionally growing out pansensitive E. coli.  Psychiatry was also consulted on admission however assessments have been obscured by ongoing encephalopathy felt to be due to both infection and drug overdose.   Assessment and Plan:  Sepsis secondary to UTI [facility]) Thought to be secondary to underlying urinary tract infection and bacteremia with E. Coli Treating with intravenous ceftriaxone Continue intravenous volume resuscitation  UTI (urinary tract  infection) - UA noted with large LE, positive nitrite, greater than 50 WBC, few bacteria - Follow-up urine culture - Blood cultures growing E. coli - Continue Rocephin  Bacteremia due to Escherichia coli Treating with intravenous ceftriaxone.   Remainder of assessment and plan as above.    Acute hypoxemic respiratory failure (HCC) - suspect in setting of OD - no overt signs of aspiration of loss of consciousness - has been weaned back to RA  Severe recurrent major depression [facility]) Psychiatry following who feels that primarily patient is suffering from encephalopathy at this point According to the patient's niece, [name] on 12/12 patient has become severely depressed since his wife passed away approximately 6 weeks ago.  She was essentially the patient's sole caretaker and he was completely dependent on her.  Since her passing, he has began "shutting down"  Essential hypertension Holding outpatient oral antihypertensives PRN intravenous antihypertensives for excessively elevated blood pressure   GERD Treating with Protonix 40 mg IV every 24 hours  Metabolic acidosis Etiology suspected due to overdose Resolved  Elevated liver enzymes - possibly in setting of sepsis vs OD - continue IVF and trend LFTs  Intentional overdose ([place]) known history of SI and attempt This presentation is concerning for intentional overdose although quantity is unclear.   Patient found to have open bottles of losartan, tizanidine, oxybutynin, baclofen and clonazepam by EMS  Sitter one-to-one safety precautions Psychiatry following Monitoring closely with serial labs and ongoing telemetry and pulse oximetry monitoring  Paraplegia (HCC) Frequent turns to minimize development of pressure ulcerations [place] cautions  Stercoral colitis Severe history of constipation.   In the past, patient's wife was primarily involved in regular disimpactions prior to her death Aggressive laxative regimen  ordered CT imaging suggestive of stercoral colitis  Given his underlying paraplegia, suspect he has neurogenic bowel  GIB (gastrointestinal bleeding) Episode of bright red blood per rectum on 12/12 followed by additional small episode morning of 12/13  Patient's status post 2 unit packed blood cell transfusion on 12/12.   GI following, their input is appreciated  Suspected etiology is due to stercoral colitis with ulceration  Holding anticoagulation  Monitoring hemoglobin and hematocrit with serial CBCs CT angiogram of the abdomen revealed no evidence of active bleed.   Goals of care, counseling/discussion Goals of care discussion 12/12 with [name] patient's niece along with multiple other family members. Family informed of patient's guarded prognosis Family at that time requested transition to DNR Otherwise, continuing with all other modalities of care.  Hypokalemia Replacing with potassium chloride Evaluating for concurrent hypomagnesemia  Monitoring potassium levels with serial chemistries.   Pressure injury of sacral region, stage 2 ([place]) Present on admission Daily dressing changes Frequent turns  Toxic metabolic encephalopathy Thought to be secondary to underlying infection as well as drug overdose and polypharmacy Supportive care, withholding most of patient's sedating agents and treating underlying infection  Subjective:  Patient is unable to provide a history due to significant encephalopathy.  Physical Exam:  Vitals:   10/18/22 1400 10/18/22 1500 10/18/22 1600 10/18/22 1700  BP: (!) 149/78 138/74 133/78   Pulse:      Resp: 17 (!) 25 (!) 25 (!) 39  Temp:   97.9 F (36.6 C)   TempSrc:   Oral   SpO2:      Weight:      Height:         Constitutional: Patient is lethargic but arousable with periods of agitation. Skin: Somewhat flushed skin.  good skin turgor noted. Eyes: Pupils are equally reactive to light.  No evidence of scleral icterus or  conjunctival pallor.  ENMT: Moist mucous membranes noted.  Posterior pharynx clear of any exudate or lesions.   Respiratory: clear to auscultation bilaterally, no wheezing, no crackles. Normal respiratory effort. No accessory muscle use.  Cardiovascular: Regular rate and rhythm, no murmurs / rubs / gallops. No extremity edema. 2+ pedal pulses. No carotid bruits.  Abdomen: Abdomen is soft and nontender.  No evidence of intra-abdominal masses.  Positive bowel sounds noted in all quadrants.   Musculoskeletal: Poor muscle tone in the lower extremities.  No joint deformity upper and lower extremities. Good ROM, no contractures.    Data Reviewed:  I have personally reviewed and interpreted labs, imaging.  Significant findings are   CBC: Recent Labs  Lab 10/15/22 1930 10/16/22 0055 10/17/22 0347 10/17/22 0719 10/17/22 1549 10/18/22 0322 10/18/22 1225  WBC 16.3* 16.3* 11.6*  --   --  16.8*  --   NEUTROABS 14.6*  --  9.2*  --   --  13.9*  --   HGB 14.7 15.5 13.6 13.6 13.6 13.3 13.2  HCT 45.6 49.0 42.5 41.7 43.0 41.5 40.2  MCV 91.0 91.8 90.4  --   --  91.0  --   PLT 246 239 275  --   --  207  --    Basic Metabolic Panel: Recent Labs  Lab 10/15/22 1930 10/16/22 0724 10/17/22 0347 10/18/22 0322  NA 140 140 139 140  K 3.6 3.7 3.3* 3.2*  CL 108 112* 107 108  CO2 19* 19* 18* 23  GLUCOSE 100* 124* 145* 122*  BUN 35* 26* 23 18  CREATININE 1.23 0.95 0.84 0.80  CALCIUM 8.1* 7.8* 8.1* 7.8*  MG  --   --  2.0 2.0   GFR: Estimated Creatinine Clearance: 84.7 mL/min (by C-G formula based on SCr of 0.8 mg/dL). Liver Function Tests: Recent Labs  Lab 10/15/22 1930 10/16/22 0724  AST 92* 64*  ALT 33 28  ALKPHOS 63 58  BILITOT 1.6* 1.2  PROT 6.5 5.9*  ALBUMIN 3.3* 2.8*    Coagulation Profile: Recent Labs  Lab 10/15/22 1930  INR 1.2     EKG/Telemetry: Personally reviewed.  Rhythm is normal sinus rhythm with heart rate of 90 bpm.  No dynamic ST segment changes  appreciated.   Code Status:  DNR.  Code status decision has been confirmed with: niece [name] on 12/12    Severity of Illness:  The appropriate patient status for this patient is INPATIENT. Inpatient status is judged to be reasonable and necessary in order to provide the required intensity of service to ensure the patient's safety. The patient's presenting symptoms, physical exam findings, and initial radiographic and laboratory data in the context of their chronic comorbidities is felt to place them at high risk for further clinical deterioration. Furthermore, it is not anticipated that the patient will be medically stable for discharge from the hospital within 2 midnights of admission.   * I certify that at the point of admission it is my clinical judgment that the patient will require inpatient hospital care spanning beyond 2 midnights from the point of admission due to high intensity of service, high risk for further deterioration and high frequency of surveillance required.*  Time spent:  60 minutes  Author:  Vernelle Emerald MD  10/18/2022 5:34 PM

## 2022-10-18 NOTE — Progress Notes (Signed)
HOSPITAL MEDICINE EVENT NOTE    Notified by nursing that patient has become increasingly agitated throughout the evening.  I promptly went to go evaluate the patient at the bedside.  Patient is extremely agitated and tremulous.  Patient is notably tachycardic with heart rates between 140-150 bpm.  This appears to be sinus tachycardia on the telemetry monitor.  Patient is not following commands.  Patient's skin is warm to the touch however patient is afebrile.  Considering patient has been hospitalized for approximately 3 days with none of the medications he overdosed with in his system, patient gives the appearance of possible benzodiazepine withdrawal.  One of the medications that the patient was thought to overdose with was clonazepam.  Therefore we give a trial dose of 2 mg of intravenous Ativan.  Patient additionally was placed in soft point wrist restraints in the meantime as patient was increasingly combative.  The intravenous Ativan and resulted in significant improvement in the patient's symptoms.  Will continue to monitor patient closely throughout the evening, giving additional intravenous Ativan as necessary.  Will additionally keep restraints in place and discontinue as able.  Monitoring closely.  Vernelle Emerald  MD [facility]

## 2022-10-18 NOTE — Consult Note (Signed)
His cam ICU is negative.  He was able to appropriately squeeze my hand with the A, and able to answer appropriate disorganized thinking assessment questions.  Despite patient having no errors on his cam ICU screener, he remains with ongoing psychosis as evidenced by some delusions, confusion, irrelevant thoughts, and responding to external stimuli.  While attempting to reassess for worsening depressive symptoms, psychiatric presentation patient stops responding to all answers.  He then begins to talk about being in a bubble and transformation of his home.  Furthermore he states nursing staff placed four pills inside a cookie and try to get him to eat them.  Safety sitter reports he has been discussing this all morning, however she did not observe nursing provide him a cookie.  Psychiatry will continue to follow, there is evidence that patient does continue to improve, without psychotropic medication.  He does continue to have multiple factors such as polypharmacy, anemia, acute blood loss, and infection that continue to affect his encephalopathy.  The same factors increase his risk for developing delirium during his hospitalization.  Will recommend continuing delirium precautions, suicide precautions.  Once patient is medically stable will require inpatient psychiatric hospitalization.  Psychiatry will continue to follow, and attempt to reassess tomorrow. Suspect polypharmacy, anemia, acute blood loss, and infection are impacting his ability to participate.  -Delirium precautions -Continue safety and suicide precautions.  Continue one-to-one.  In the event patient attempts to leave will need to be placed under IVC.

## 2022-10-18 NOTE — Assessment & Plan Note (Addendum)
Goals of care discussion 12/12 with Jearld Adjutant patient's niece along with multiple other family members. No one has power of attorney.  Decision making for patient's care has been shared between the patient's son-in-law, daughter-in-law and niece. discussed goals of care with niece and family on 12/14.  They are well aware of the patient's guarded prognosis.   Patient is currently DNR Palliative care has been consulted and is following along, their assistance is appreciated.

## 2022-10-18 NOTE — Assessment & Plan Note (Addendum)
Thought to be multifactorial secondary to underlying infection with superimposed drug overdose.   Now, there is concern that patient may be withdrawing from these same drugs, particularly benzodiazepine and baclofen withdrawal.  Recent TSH, vitamin B12 unremarkable. Obtaining EEG today followed by CT imaging of the head Possible neurology consultation tomorrow. Supportive care, now providing modest amounts of benzodiazepines and baclofen as well as treating underlying infection

## 2022-10-18 NOTE — Progress Notes (Signed)
Patient ID: David Kim, male   DOB: 14-Nov-1945, 76 y.o.   MRN: [id number]    Progress Note   Subjective   Day #3 CC: Acute GI bleed  CTA negative for acute bleed last pm-interval evacuation of stool in the rectal vault persistent rectal wall thickening consistent with stercoral colitis gas/fluid levels in the distal colon reflect possible interval edema, new dependent groundglass consolidation in the lungs right greater than left question aspiration or hypoventilatory change, continued heterogeneous decreased enhancement mid left kidney concerning for pyelonephritis  This a.m. WBC up to 16.8/hemoglobin 13.3/hematocrit 41.5 status post 2 units yesterday Potassium 3.2/BUN 18/creatinine 0.8  Patient is alert, calm, denies any pain remains confused, talking about [place] being at a bubble which is going to burst general going to get wet..  Nursing reports just a very small amount of red blood last night like would be seen with a hemorrhoid, no further bowel movements   Objective   Vital signs in last 24 hours: Temp:  [97.6 F (36.4 C)-103.2 F (39.6 C)] 98.1 F (36.7 C) (12/13 0800) Pulse Rate:  [74-146] 88 (12/13 0800) Resp:  [5-37] 21 (12/13 0800) BP: (84-178)/(44-139) 122/69 (12/13 0800) SpO2:  [88 %-100 %] 100 % (12/13 0800) Last BM Date : 10/17/22 General: Elderly white male in NAD, alert, responding to some questions. Heart:  Regular rate and rhythm; no murmurs Lungs: Respirations even and unlabored, lungs CTA bilaterally Abdomen:  Soft, nontender and nondistended. Normal bowel sounds. Extremities:  Without edema. Neurologic:  Alert , disoriented Psych:  Cooperative.  Intake/Output from previous day: 12/12 0701 - 12/13 0700 In: 1529.4 [P.O.:240; I.V.:317.4; Blood:672; IV Piggyback:300] Out: 650 [Urine:650] Intake/Output this shift: No intake/output data recorded.  Lab Results: Recent Labs    10/16/22 0055 10/17/22 0347 10/17/22 0719 10/17/22 1549 10/18/22 0322  WBC  16.3* 11.6*  --   --  16.8*  HGB 15.5 13.6 13.6 13.6 13.3  HCT 49.0 42.5 41.7 43.0 41.5  PLT 239 275  --   --  207   BMET Recent Labs    10/16/22 0724 10/17/22 0347 10/18/22 0322  NA 140 139 140  K 3.7 3.3* 3.2*  CL 112* 107 108  CO2 19* 18* 23  GLUCOSE 124* 145* 122*  BUN 26* 23 18  CREATININE 0.95 0.84 0.80  CALCIUM 7.8* 8.1* 7.8*   LFT Recent Labs    10/16/22 0724  PROT 5.9*  ALBUMIN 2.8*  AST 64*  ALT 28  ALKPHOS 58  BILITOT 1.2   PT/INR Recent Labs    10/15/22 1930  LABPROT 15.2  INR 1.2    Studies/Results: CT ANGIO GI BLEED  Result Date: 10/17/2022 CLINICAL DATA:  Rectal bleeding EXAM: CTA ABDOMEN AND PELVIS WITHOUT AND WITH CONTRAST TECHNIQUE: Multidetector CT imaging of the abdomen and pelvis was performed using the standard protocol during bolus administration of intravenous contrast. Multiplanar reconstructed images and MIPs were obtained and reviewed to evaluate the vascular anatomy. RADIATION DOSE REDUCTION: This exam was performed according to the departmental dose-optimization program which includes automated exposure control, adjustment of the mA and/or kV according to patient size and/or use of iterative reconstruction technique. CONTRAST:  19m OMNIPAQUE IOHEXOL 350 MG/ML SOLN COMPARISON:  10/17/2022 at 9:30 a.m. FINDINGS: VASCULAR Aorta: Normal caliber aorta without aneurysm, dissection, vasculitis or significant stenosis. Stable atherosclerosis. Celiac: Patent without evidence of aneurysm, dissection, vasculitis or significant stenosis. SMA: Patent without evidence of aneurysm, dissection, vasculitis or significant stenosis. Renals: 2 right and single left renal arteries  are unchanged since exam earlier today. Atherosclerosis without significant stenosis, aneurysm, or dissection. IMA: Patent without evidence of aneurysm, dissection, vasculitis or significant stenosis. Inflow: Patent without evidence of aneurysm, dissection, vasculitis or significant  stenosis. Proximal Outflow: Bilateral common femoral and visualized portions of the superficial and profunda femoral arteries are patent without evidence of aneurysm, dissection, vasculitis or significant stenosis. Veins: No obvious venous abnormality within the limitations of this arterial phase study. Review of the MIP images confirms the above findings. NON-VASCULAR Lower chest: Dependent areas of consolidation at the lung bases, right greater than left. This could reflect aspiration or hypoventilatory change. Hepatobiliary: Stable appearance of the liver and gallbladder. The gallbladder slight seen previously is not well visualized on this exam. No biliary duct dilation. Pancreas: Unremarkable. No pancreatic ductal dilatation or surrounding inflammatory changes. Spleen: Normal in size without focal abnormality. Adrenals/Urinary Tract: Excreted contrast from previous CT angiography procedure. The heterogeneous decreased enhancement within the mid left kidney again noted, consistent with focal pyelonephritis. No evidence of renal abscess. The right kidney is unremarkable. The adrenals are stable. Bladder is decompressed, with persistent bladder wall thickening. Stomach/Bowel: Interval evacuation of the stool within the rectal vault. There is persistent rectal wall thickening consistent with residual of stercoral colitis. Gas fluid levels within the distal colon could reflect interval enema. No bowel obstruction or ileus. There is no intraluminal accumulation of contrast to suggest active gastrointestinal bleeding. Lymphatic: No pathologic adenopathy. Reproductive: Prior prostatectomy. No abnormality of the prostate bed. Other: No free intraperitoneal fluid or free gas. No abdominal wall hernia. Musculoskeletal: Chronic nonunion right hip fracture. No acute bony abnormalities. Reconstructed images demonstrate no additional findings. IMPRESSION: VASCULAR 1. No evidence of active gastrointestinal bleeding. 2.  Aortic  Atherosclerosis (ICD10-I70.0). NON-VASCULAR 1. Interval evacuation of the retained stool from the rectal vault, with persistent rectal wall thickening consistent with residual of stercoral colitis. 2. New dependent ground-glass consolidation within the lungs, right greater than left. This could reflect aspiration or hypoventilatory change. 3. Continued heterogeneous decreased enhancement mid left kidney concerning for pyelonephritis. 4. Additional chronic findings as above. Electronically Signed   By: Randa Ngo M.D.   On: 10/17/2022 17:35   CT ANGIO GI BLEED  Result Date: 10/17/2022 CLINICAL DATA:  Rectal bleeding EXAM: CTA ABDOMEN AND PELVIS WITHOUT AND WITH CONTRAST TECHNIQUE: Multidetector CT imaging of the abdomen and pelvis was performed using the standard protocol during bolus administration of intravenous contrast. Multiplanar reconstructed images and MIPs were obtained and reviewed to evaluate the vascular anatomy. RADIATION DOSE REDUCTION: This exam was performed according to the departmental dose-optimization program which includes automated exposure control, adjustment of the mA and/or kV according to patient size and/or use of iterative reconstruction technique. CONTRAST:  155m OMNIPAQUE IOHEXOL 350 MG/ML SOLN COMPARISON:  None Available. FINDINGS: VASCULAR Aorta: Normal caliber aorta without aneurysm, dissection, vasculitis or significant stenosis. Scattered atherosclerotic plaque. Celiac: Patent without evidence of aneurysm, dissection, vasculitis or significant stenosis. SMA: Patent without evidence of aneurysm, dissection, vasculitis or significant stenosis. Renals: There are 2 code dominant right-sided renal arteries and a solitary left renal artery. Mild atherosclerotic plaque bilaterally without significant stenosis. No aneurysm, dissection or changes of fibromuscular dysplasia. IMA: Patent without evidence of aneurysm, dissection, vasculitis or significant stenosis. Inflow: Patent  without evidence of aneurysm, dissection, vasculitis or significant stenosis. Proximal Outflow: Bilateral common femoral and visualized portions of the superficial and profunda femoral arteries are patent without evidence of aneurysm, dissection, vasculitis or significant stenosis. Veins: No focal venous abnormality. Review  of the MIP images confirms the above findings. NON-VASCULAR Lower chest: Scattered areas of pleuroparenchymal scarring in the lower lungs. No acute abnormality. Hepatobiliary: Normal hepatic contour and morphology. Several subcentimeter low-attenuation lesions are present scattered throughout the liver. No evidence of enhancement on arterial or portal venous phase imaging. Lesions are too small for accurate characterization but are statistically highly likely to represent benign cysts or biliary hamartomas. The gallbladder is mildly distended. High attenuation material layers in the gallbladder lumen consistent with the presence of sludge and/or small stones. No secondary imaging findings to suggest acute cholecystitis. No intra or extrahepatic biliary ductal dilatation. Pancreas: Unremarkable. No pancreatic ductal dilatation or surrounding inflammatory changes. Spleen: No splenic injury or perisplenic hematoma. Adrenals/Urinary Tract: Normal adrenal glands. Unremarkable appearance of the right kidney. Region of linear striated enhancement in the left interpolar kidney concerning for focal pyelonephritis. No hydronephrosis. No enhancing renal mass. The bladder is under distended but appears diffusely thick walled. Contrast material present within the excreted urine. Stomach/Bowel: No evidence of acute GI bleeding. Circumferential rectal thickening with small stool ball in place. Stercoral colitis is a consideration. This could represent a source for rectal bleeding. No evidence of obstruction. No other focal bowel wall thickening. Normal appendix in the right lower quadrant. Lymphatic: No  suspicious lymphadenopathy. Reproductive: Prostate is unremarkable. Other: No abdominal wall hernia or abnormality. No abdominopelvic ascites. Musculoskeletal: Chronic appearing compression deformity of the posterior aspect of L5. Chronic nonunion of a right inter trochanteric femoral fracture. Extensive heterotopic ossification. No definite acute osseous abnormality. IMPRESSION: 1. No evidence of acute GI bleeding. 2. Rectal stool ball with circumferential thickening of the rectal wall concerning for constipation and stercoral colitis. This could represent a source for rectal bleeding. 3. Focal striated enhancement of the interpolar left kidney raises the possibility of focal pyelonephritis. Recommend clinical correlation with urinalysis. 4. Scattered atherosclerotic plaque without significant stenosis, aneurysm or dissection. 5. Small stones and/or sludge layer within the gallbladder lumen. 6. Chronic appearing L5 compression fracture. 7. Chronic nonunion of a right intertrochanteric femoral fracture with surrounding heterotopic ossification. 8. Additional ancillary findings as above. Aortic Atherosclerosis (ICD10-I70.0). Electronically Signed   By: Jacqulynn Cadet M.D.   On: 10/17/2022 09:48       Assessment / Plan:    #24 76 year old white male with acute lower GI bleeding onset yesterday morning dark red bloody stool and clots, larger amount yesterday afternoon precipitating repeat CTA which was negative for any active bleeding, but did note persistent stercoral colitis  He was transfused 2 units of packed RBCs last p.m., hemoglobin stable at 13 today Lovenox stopped  Appears much more comfortable and stable today, tachycardia and tachypnea resolved  Source of bleeding very likely secondary to a stercoral ulcer or ulcers No active bleeding overnight  #2 chronic constipation in the setting of paraplegia #3 E. coli bacteremia/urinary sepsis with high fever, tachypnea, tachycardia  10/17/2022  #4 history of prostate cancer #5.  Sleep apnea #6.  Admitted with suicide attempt, took multiple prescription medications at home, remains delirious  Plan; start clear liquids today Continue to follow hemoglobin closely, transfuse as indicated If patient has further active bleeding, can consider flexible sigmoidoscopy for treatment/injection/clip of any stercoral ulcers.  No active bleeding, keep bowels moving, and allow time for these to heal His impaction has resolved, would keep on twice daily MiraLAX.    Principal Problem:   Intentional overdose [facility]) Active Problems:   Depression   Paraplegia ([place])   Essential hypertension  GERD   UTI (urinary tract infection)   Sepsis ([place])   Metabolic acidosis   Elevated liver enzymes   Bacteremia due to Escherichia coli   Pressure injury of skin   Constipation   GIB (gastrointestinal bleeding)     LOS: 3 days   David Menna PA-C 10/18/2022, 8:41 AM

## 2022-10-18 NOTE — Assessment & Plan Note (Addendum)
Replete and trend

## 2022-10-19 ENCOUNTER — Inpatient Hospital Stay (HOSPITAL_COMMUNITY): Payer: Medicare Other

## 2022-10-19 DIAGNOSIS — G9340 Encephalopathy, unspecified: Secondary | ICD-10-CM | POA: Diagnosis not present

## 2022-10-19 DIAGNOSIS — F13931 Sedative, hypnotic or anxiolytic use, unspecified with withdrawal delirium: Secondary | ICD-10-CM | POA: Diagnosis not present

## 2022-10-19 DIAGNOSIS — K626 Ulcer of anus and rectum: Secondary | ICD-10-CM

## 2022-10-19 DIAGNOSIS — R933 Abnormal findings on diagnostic imaging of other parts of digestive tract: Secondary | ICD-10-CM

## 2022-10-19 DIAGNOSIS — K5909 Other constipation: Secondary | ICD-10-CM

## 2022-10-19 DIAGNOSIS — D62 Acute posthemorrhagic anemia: Secondary | ICD-10-CM

## 2022-10-19 DIAGNOSIS — G8220 Paraplegia, unspecified: Secondary | ICD-10-CM

## 2022-10-19 DIAGNOSIS — Z515 Encounter for palliative care: Secondary | ICD-10-CM

## 2022-10-19 DIAGNOSIS — J189 Pneumonia, unspecified organism: Secondary | ICD-10-CM | POA: Diagnosis not present

## 2022-10-19 LAB — COMPREHENSIVE METABOLIC PANEL
ALT: 31 U/L (ref 0–44)
AST: 68 U/L — ABNORMAL HIGH (ref 15–41)
Albumin: 2.4 g/dL — ABNORMAL LOW (ref 3.5–5.0)
Alkaline Phosphatase: 51 U/L (ref 38–126)
Anion gap: 12 (ref 5–15)
BUN: 13 mg/dL (ref 8–23)
CO2: 17 mmol/L — ABNORMAL LOW (ref 22–32)
Calcium: 7.7 mg/dL — ABNORMAL LOW (ref 8.9–10.3)
Chloride: 107 mmol/L (ref 98–111)
Creatinine, Ser: 0.74 mg/dL (ref 0.61–1.24)
GFR, Estimated: >60 mL/min (ref >60)
Glucose, Bld: 136 mg/dL — ABNORMAL HIGH (ref 70–99)
Potassium: 3.1 mmol/L — ABNORMAL LOW (ref 3.5–5.1)
Sodium: 136 mmol/L (ref 135–145)
Total Bilirubin: 1 mg/dL (ref 0.3–1.2)
Total Protein: 5.1 g/dL — ABNORMAL LOW (ref 6.5–8.1)

## 2022-10-19 LAB — URINALYSIS, ROUTINE W REFLEX MICROSCOPIC
Bilirubin Urine: NEGATIVE
Glucose, UA: 50 mg/dL — AB
Ketones, ur: 80 mg/dL — AB
Nitrite: NEGATIVE
Protein, ur: NEGATIVE mg/dL
Specific Gravity, Urine: 1.016 (ref 1.005–1.030)
pH: 5 (ref 5.0–8.0)

## 2022-10-19 LAB — LACTIC ACID, PLASMA
Lactic Acid, Venous: 1.2 mmol/L (ref 0.5–1.9)
Lactic Acid, Venous: 1.7 mmol/L (ref 0.5–1.9)

## 2022-10-19 LAB — PROCALCITONIN: Procalcitonin: 5.73 ng/mL

## 2022-10-19 LAB — MAGNESIUM: Magnesium: 1.7 mg/dL (ref 1.7–2.4)

## 2022-10-19 MED ORDER — LORAZEPAM 2 MG/ML IJ SOLN
1.0000 mg | Freq: Once | INTRAMUSCULAR | Status: AC
  Administered 2022-10-19: 1 mg via INTRAVENOUS
  Filled 2022-10-19: qty 1

## 2022-10-19 MED ORDER — VANCOMYCIN HCL 1500 MG/300ML IV SOLN
1500.0000 mg | Freq: Once | INTRAVENOUS | Status: AC
  Administered 2022-10-19: 1500 mg via INTRAVENOUS
  Filled 2022-10-19: qty 300

## 2022-10-19 MED ORDER — POTASSIUM CHLORIDE 2 MEQ/ML IV SOLN
INTRAVENOUS | Status: AC
  Filled 2022-10-19: qty 1000

## 2022-10-19 MED ORDER — BACLOFEN 10 MG PO TABS
10.0000 mg | ORAL_TABLET | Freq: Three times a day (TID) | ORAL | Status: DC
  Administered 2022-10-19 – 2022-10-22 (×10): 10 mg via ORAL
  Filled 2022-10-19 (×10): qty 1

## 2022-10-19 MED ORDER — LORAZEPAM 1 MG PO TABS
1.0000 mg | ORAL_TABLET | Freq: Three times a day (TID) | ORAL | Status: DC
  Administered 2022-10-19 – 2022-10-21 (×6): 1 mg via ORAL
  Filled 2022-10-19 (×6): qty 1

## 2022-10-19 MED ORDER — LORAZEPAM 2 MG/ML IJ SOLN: 2.0000 mg | Freq: Once | INTRAMUSCULAR | Status: DC

## 2022-10-19 MED ORDER — LORAZEPAM 2 MG/ML IJ SOLN
2.0000 mg | Freq: Once | INTRAMUSCULAR | Status: AC
  Administered 2022-10-19: 2 mg via INTRAVENOUS
  Filled 2022-10-19: qty 1

## 2022-10-19 MED ORDER — SODIUM CHLORIDE 0.9 % IV SOLN
2.0000 g | Freq: Three times a day (TID) | INTRAVENOUS | Status: AC
  Administered 2022-10-19 – 2022-10-24 (×14): 2 g via INTRAVENOUS
  Filled 2022-10-19 (×14): qty 12.5

## 2022-10-19 MED ORDER — VANCOMYCIN HCL 750 MG/150ML IV SOLN
750.0000 mg | Freq: Two times a day (BID) | INTRAVENOUS | Status: AC
  Administered 2022-10-20 – 2022-10-24 (×10): 750 mg via INTRAVENOUS
  Filled 2022-10-19 (×11): qty 150

## 2022-10-19 MED ORDER — DOCUSATE SODIUM 100 MG PO CAPS
100.0000 mg | ORAL_CAPSULE | Freq: Two times a day (BID) | ORAL | Status: DC
  Administered 2022-10-19 – 2022-11-17 (×43): 100 mg via ORAL
  Filled 2022-10-19 (×47): qty 1

## 2022-10-19 MED ORDER — POTASSIUM CHLORIDE 2 MEQ/ML IV SOLN
INTRAVENOUS | Status: DC
  Filled 2022-10-19 (×14): qty 1000

## 2022-10-19 NOTE — Consult Note (Signed)
Consultation Note Date: 10/19/2022   Patient Name: David Kim  DOB: [birth year]  MRN: [id number]  Age / Sex: 76 y.o., male  PCP: Biagio Borg, MD Referring Physician: Vernelle Emerald, MD  Reason for Consultation: Establishing goals of care  HPI/Patient Profile: 76 y.o. male  with past medical history of paraplegia, hypertension, PUD, GERD, IBS, OSA, prostate cancer, severe MDD with recent suicidal ideation requiring IVC in the ED admitted on 10/15/2022 with drug overdose and depression escalated after recent death of his wife.   Clinical Assessment and Goals of Care: I have reviewed records and discussed with bedside RN. I met with David Kim and he is lethargic and confused. He is unable to say more than a few words at a time and what he does say does not make sense. They only thing I could elicit is when I asked who David Kim is he tells me "my niece." He does give me permission to call David Kim.   I call and speak with niece, David Kim. David Kim shares that David Kim was married to his wife, David Kim for many years and they had no children. She reports that they have increasingly kept to themselves over the years and David Kim was David Kim's main caregiver. He has had depression for ~15 years since becoming paraplegic. His wife David Kim died  ~6 weeks ago and he has had suicidal ideation and attempt ever since. David Kim shares that family recognize his suffering and they have decided to make him DNR. She reports that David Kim has no directives and he and his wife were living a fairly isolated life the past few years. There have been no previous conversations regarding wishes or at least none that David Kim is aware of happening.  I attempted to elicit further goals of care and family want to treat anything that would add to his comfort and quality of life but not with the goal to prolong. Will continue antibiotics at this stage. No desire to escalate care to  prolong life if he declines further. They agree with inpatient psychiatric placement. Nina's main concern is the ultimate plan for David Kim as far as care and placement. I explained that the problem will be down the road if he is cleared by psych and has capacity then we will cross that bridge at that time. At this point of the conversation the call was dropped. I attempted to call David Kim back without success but she does have my contact and we agreed to speak again tomorrow.   All questions/concerns addressed. Emotional support provided.   Primary Decision Maker NEXT OF KIN niece David Kim is main family contact and coordinating information to David Kim's siblings    SUMMARY OF RECOMMENDATIONS   - DNR already in place - No escalation if he declines further and may consider transition to comfort but continue antibiotics and conservative treatment at this stage  - Pursue psych inpatient placement  Code Status/Advance Care Planning: DNR   Symptom Management:  Per psych, attending.   Prognosis:  Overall prognosis poor.   Discharge Planning: Will  need inpatient psych placement.      Primary Diagnoses: Present on Admission:  Intentional overdose ([place])  Sepsis secondary to UTI ([place])  Severe recurrent major depression ([place])  Essential hypertension  GERD  Bacteremia due to Escherichia coli  Pressure injury of sacral region, stage 2 ([place])  Paraplegia ([place])  Stercoral colitis  Toxic metabolic encephalopathy   I have reviewed the medical record, interviewed the patient and family, and examined the patient. The following aspects are pertinent.  Past Medical History:  Diagnosis Date   Allergic rhinitis    ALLERGIC RHINITIS 10/01/2007   Qualifier: Diagnosis of  By: Jenny Reichmann MD, Hunt Oris    Anxiety    Carpal tunnel syndrome    right   Depression    Depression    Erectile dysfunction    Fatigue    GERD (gastroesophageal reflux disease)    Gout    Hypertension    HYPOGONADISM 03/31/2008    Qualifier: Diagnosis of  By: Jenny Reichmann MD, Hunt Oris    IBS (irritable bowel syndrome)    Low back pain    OSA (obstructive sleep apnea)    Paraplegia ([place]) 04/23/2009   Qualifier: Diagnosis of  By: Jenny Reichmann MD, Hunt Oris    Peptic ulcer disease    PEPTIC ULCER DISEASE 10/01/2007   Qualifier: Diagnosis of  By: Jenny Reichmann MD, Hunt Oris    Prostate cancer [facility])    PROSTATE CANCER, HX OF 05/23/2007   Qualifier: Diagnosis of  By: Jenny Reichmann MD, [place] PARALYSIS 04/23/2009   Qualifier: Diagnosis of  By: Jenny Reichmann MD, Hunt Oris    Thoracic spinal cord injury ([place]) 03/12/2012   Social History   Socioeconomic History   Marital status: Married    Spouse name: David Kim   Number of children: 0   Years of education: 12   Highest education level: Not on file  Occupational History   Occupation: retired    Fish farm manager: RETIRED  Tobacco Use   Smoking status: Never   Smokeless tobacco: Never  Vaping Use   Vaping Use: Never used  Substance and Sexual Activity   Alcohol use: No   Drug use: No   Sexual activity: Not on file  Other Topics Concern   Not on file  Social History Narrative   Patient lives at home with his wife David Kim). Patient is disabled. Patient has 12 th grade education.   Right handed.   Caffeine- None   Social Determinants of Health   Financial Resource Strain: Low Risk  (07/24/2022)   Overall Financial Resource Strain (CARDIA)    Difficulty of Paying Living Expenses: Not hard at all  Food Insecurity: No Food Insecurity (07/24/2022)   Hunger Vital Sign    Worried About Running Out of Food in the Last Year: Never true    Ran Out of Food in the Last Year: Never true  Transportation Needs: No Transportation Needs (07/24/2022)   PRAPARE - Hydrologist (Medical): No    Lack of Transportation (Non-Medical): No  Physical Activity: Inactive (07/24/2022)   Exercise Vital Sign    Days of Exercise per Week: 0 days    Minutes of Exercise per Session: 0 min  Stress: No Stress  Concern Present (07/24/2022)   [place]    Feeling of Stress : Not at all  Social Connections: Moderately Isolated (07/24/2022)   Social Connection and Isolation Panel [NHANES]    Frequency of Communication  with Friends and Family: More than three times a week    Frequency of Social Gatherings with Friends and Family: More than three times a week    Attends Religious Services: Never    Marine scientist or Organizations: No    Attends Music therapist: Never    Marital Status: Married   Family History  Problem Relation Age of Onset   High blood pressure Mother    Dementia Mother    Diabetes Father    Scheduled Meds:  Chlorhexidine Gluconate Cloth  6 each Topical Daily   docusate sodium  100 mg Oral BID   enoxaparin (LOVENOX) injection  40 mg Subcutaneous Q24H   leptospermum manuka honey  1 Application Topical Daily   magnesium citrate  1 Bottle Oral Once   mouth rinse  15 mL Mouth Rinse 4 times per day   pantoprazole (PROTONIX) IV  40 mg Intravenous Q24H   polyethylene glycol  17 g Oral BID   potassium chloride  40 mEq Oral Once   senna-docusate  1 tablet Oral BID   traZODone  50 mg Oral QHS   Continuous Infusions:  cefTRIAXone (ROCEPHIN)  IV Stopped (10/18/22 2340)   lactated ringers 75 mL/hr at 10/19/22 0648   PRN Meds:.acetaminophen **OR** acetaminophen, hydrALAZINE, mouth rinse, polyvinyl alcohol Allergies  Allergen Reactions   Amoxicillin Other (See Comments)    Unknown    Endal Hd Other (See Comments)    Unknown    Review of Systems  Unable to perform ROS: Acuity of condition    Physical Exam Vitals and nursing note reviewed.  Cardiovascular:     Rate and Rhythm: Tachycardia present.  Pulmonary:     Effort: No tachypnea, accessory muscle usage or respiratory distress.  Abdominal:     Palpations: Abdomen is soft.  Neurological:     Mental Status: He is lethargic and  confused.     Vital Signs: BP (!) 195/82   Pulse (!) 107   Temp 98.3 F (36.8 C) (Oral)   Resp (!) 36   Ht _0  (1.727 m)   Wt 82.2 kg   SpO2 95%   BMI 27.55 kg/m  Pain Scale: PAINAD   Pain Score: 0-No pain   SpO2: SpO2: 95 % O2 Device:SpO2: 95 % O2 Flow Rate: .O2 Flow Rate (L/min): 2 L/min  IO: Intake/output summary:  Intake/Output Summary (Last 24 hours) at 10/19/2022 1500 Last data filed at 10/19/2022 0600 Gross per 24 hour  Intake 1208.49 ml  Output 525 ml  Net 683.49 ml    LBM: Last BM Date : 10/17/22 Baseline Weight: Weight: 82.2 kg Most recent weight: Weight: 82.2 kg     Palliative Assessment/Data:     Time In: 1500  Time Total: 85 min  Greater than 50%  of this time was spent counseling and coordinating care related to the above assessment and plan.  Signed by: Vinie Sill, NP Palliative Medicine Team Pager # [phone] (M-F 8a-5p) Team Phone # [phone] (Nights/Weekends)

## 2022-10-19 NOTE — Progress Notes (Signed)
PROGRESS NOTE   David Kim  POE:[id number] DOB: 1946-02-29 DOA: 10/15/2022 PCP: Biagio Borg, MD   Date of Service: the patient was seen and examined on 10/19/2022  Brief Narrative:  David Kim is a 76 yo male with PMH paraplegia, severe MDD, persistent adjustment disorder with mixed anxiety and depressed mood ,PUD, gout, HTN, IBS, OSA, prostate cancer, GERD.  He was recently evaluated under IVC last month after being depressed and expressing suicidal thoughts.  He was evaluated by psychiatry and cleared for discharge home with decrease in his Klonopin dose.  Patient presented to [facility] emergency department after home health aide discovered patient took an unknown amount of pills.    During the patient's emergency department evaluation, discussions with EMS revealed there were opened bottles of losartan, tizanidine, oxybutynin, baclofen, and clonazepam.  Upon initial interview the patient endorsed at least taking clonazepam and tizanidine but would not provide additional information.  Initial evaluation also revealed multiple SIRS criteria with fever of 103.2 and urinalysis suggestive of urinary tract infection prompting initiation of intravenous ceftriaxone.  Patient was initially hospitalized in the stepdown unit for suspected drug overdose with signs concerning for sepsis secondary to urinary tract infection.  Shortly after hospitalization 1 of 4 blood culture bottles came back positive for E. Coli with urine culture additionally growing out pansensitive E. coli.  Psychiatry was also consulted on admission however assessments have been obscured by ongoing encephalopathy felt to be due to both infection and drug overdose.   Assessment and Plan: * Intentional overdose ([place]) Behavioral health hospitalization 09/2022 for suicidal ideation and attempt Patient's presentation is concerning for intentional overdose, found to have open bottles of losartan, tizanidine, oxybutynin,  baclofen and clonazepam by EMS.  Quantity is unclear.   Continue sitter one-to-one safety precautions Will place IVC paperwork if patient attempts to elope. Psychiatry following Monitoring closely with serial labs and ongoing telemetry and pulse oximetry monitoring  Sepsis ([place]) Patient initially was treated for sepsis thought to primarily be secondary to E. coli urinary tract infection  Despite being treated with intravenous ceftriaxone patient continued to exhibit multiple SIRS criteria and a rising procalcitonin  Chest x-ray performed this morning additionally reveals a substantial left lower lobe developing infiltrate concerning for concurrent pneumonia  Antibiotics have therefore been broadened to cefepime and vancomycin  Repeat blood cultures have been obtained as have lactic acid levels  Hydrating patient with intravenous isotonic fluids   Benzodiazepine withdrawal with delirium ([place]) Patient is additionally been exhibiting worsening delirium over the past 48 hours This in the setting of increasing tremulousness and hallucinations is concerning for at the very least benzodiazepine withdrawal and possibly superimposed baclofen withdrawal Per my discussions with Dr. Lovette Cliche with psychiatry we have placed the patient on scheduled regimen of both Ativan and reduced dose baclofen Continue to monitor closely  Acute hypoxemic respiratory failure (HCC)-resolved as of 10/16/2022 - suspect in setting of OD - no overt signs of aspiration of loss of consciousness - has been weaned back to RA  GIB (gastrointestinal bleeding) Episode of bright red blood per rectum on 12/12 followed by additional small episode morning of 12/13  Patient's status post 2 unit packed blood cell transfusion on 12/12.   GI following, they feel that susspected etiology is due to stercoral colitis with ulceration  Holding anticoagulation  Monitoring hemoglobin and hematocrit with serial CBCs CT angiogram of the  abdomen performed 12/12 revealed no evidence of active bleed.   Bacteremia due to Escherichia coli  Currently on intravenous antibiotics as noted above  Toxic metabolic encephalopathy Thought to be multifactorial secondary to underlying infection with superimposed drug overdose.  Now, there is concern that patient may be withdrawing from these same drugs, particularly benzodiazepine and baclofen withdrawal.  Supportive care, now providing modest amounts of benzodiazepines and baclofen as well as treating underlying infection  Stercoral colitis Severe history of constipation.   In the past, patient's wife was primarily involved in regular disimpactions prior to her death Aggressive laxative regimen ordered with MiraLAX senna and Colace CT imaging suggestive of stercoral colitis  Given his underlying paraplegia, suspect he has neurogenic bowel  Metabolic acidosis Etiology suspected due to overdose Resolved  Severe recurrent major depression [facility]) Psychiatry following although patient is suffering from encephalopathy at this point According to the patient's niece, David Kim on 12/12 patient has become severely depressed since his wife passed away approximately 6 weeks ago.  She was essentially the patient's sole caretaker and he was completely dependent on her.  Since her passing, he has began "shutting down"  Pressure injury of sacral region, stage 2 ([place]) Present on admission Daily dressing changes Frequent turns  Essential hypertension Holding outpatient oral antihypertensives PRN intravenous antihypertensives for excessively elevated blood pressure   Paraplegia (HCC) Frequent turns to minimize development of pressure ulcerations [place] cautions  GERD Treating with Protonix 40 mg IV every 24 hours  Hypokalemia Persisting Continuing to replace with oral and intravenous potassium chloride Monitoring potassium levels with serial chemistries.   Goals of care,  counseling/discussion Goals of care discussion 12/12 with David Kim patient's niece along with multiple other family members. Family informed of patient's guarded prognosis Of care is additionally discussed goals of care with niece and family today on 12/14.  They do not wish to escalate care further at this time. Patient is currently DNR  Elevated liver enzymes - possibly in setting of sepsis vs OD - continue IVF and trend LFTs        Subjective:  Patient unable to answer questions appropriately due to encephalopathy.  Physical Exam:  Vitals:   10/19/22 1126 10/19/22 1548 10/19/22 1941 10/19/22 2000  BP:    131/69  Pulse:    80  Resp:    (!) 21  Temp: 98.3 F (36.8 C) 98.9 F (37.2 C) 97.9 F (36.6 C)   TempSrc: Oral Oral Oral   SpO2:    97%  Weight:      Height:        Constitutional: Agitated, minimally verbal, intermittently following commands. Skin: no rashes, no lesions, flushed skin, poor skin turgor. Eyes: Pupils are equally reactive to light.  No evidence of scleral icterus or conjunctival pallor.  ENMT: Somewhat dry mucous membranes noted.  Posterior pharynx clear of any exudate or lesions.   Respiratory: Notable bibasilar rales with no evidence of significant wheezing.  Normal respiratory effort. No accessory muscle use.  Cardiovascular: Tachycardic rate with regular rhythm no murmurs / rubs / gallops. No extremity edema. 2+ pedal pulses. No carotid bruits.  Abdomen: Abdomen is soft and nontender.  No evidence of intra-abdominal masses.  Positive bowel sounds noted in all quadrants.   Musculoskeletal: No joint deformity upper and lower extremities. Good ROM, no contractures. Normal muscle tone.    Data Reviewed:  I have personally reviewed and interpreted labs, imaging.  Significant findings are   CBC: Recent Labs  Lab 10/15/22 1930 10/16/22 0055 10/17/22 0347 10/17/22 0719 10/17/22 1549 10/18/22 0322 10/18/22 1225 10/18/22 1706  WBC  16.3*  16.3* 11.6*  --   --  16.8*  --   --   NEUTROABS 14.6*  --  9.2*  --   --  13.9*  --   --   HGB 14.7 15.5 13.6 13.6 13.6 13.3 13.2 14.5  HCT 45.6 49.0 42.5 41.7 43.0 41.5 40.2 45.1  MCV 91.0 91.8 90.4  --   --  91.0  --   --   PLT 246 239 275  --   --  207  --   --    Basic Metabolic Panel: Recent Labs  Lab 10/15/22 1930 10/16/22 0724 10/17/22 0347 10/18/22 0322 10/19/22 0925  NA 140 140 139 140 136  K 3.6 3.7 3.3* 3.2* 3.1*  CL 108 112* 107 108 107  CO2 19* 19* 18* 23 17*  GLUCOSE 100* 124* 145* 122* 136*  BUN 35* 26* '23 18 13  '$ CREATININE 1.23 0.95 0.84 0.80 0.74  CALCIUM 8.1* 7.8* 8.1* 7.8* 7.7*  MG  --   --  2.0 2.0 1.7   GFR: Estimated Creatinine Clearance: 84.7 mL/min (by C-G formula based on SCr of 0.74 mg/dL). Liver Function Tests: Recent Labs  Lab 10/15/22 1930 10/16/22 0724 10/19/22 0925  AST 92* 64* 68*  ALT 33 28 31  ALKPHOS 63 58 51  BILITOT 1.6* 1.2 1.0  PROT 6.5 5.9* 5.1*  ALBUMIN 3.3* 2.8* 2.4*    Coagulation Profile: Recent Labs  Lab 10/15/22 1930  INR 1.2     EKG/Telemetry: Personally reviewed.  Rhythm is normal sinus rhythm with heart rate of 98 bpm.  No dynamic ST segment changes appreciated.   Code Status:  DNR.  Code status decision has been confirmed with: niece Family Communication: Palliative care has discussed goals of care with niece and family today.   Severity of Illness:  The appropriate patient status for this patient is INPATIENT. Inpatient status is judged to be reasonable and necessary in order to provide the required intensity of service to ensure the patient's safety. The patient's presenting symptoms, physical exam findings, and initial radiographic and laboratory data in the context of their chronic comorbidities is felt to place them at high risk for further clinical deterioration. Furthermore, it is not anticipated that the patient will be medically stable for discharge from the hospital within 2 midnights of admission.    * I certify that at the point of admission it is my clinical judgment that the patient will require inpatient hospital care spanning beyond 2 midnights from the point of admission due to high intensity of service, high risk for further deterioration and high frequency of surveillance required.*  Time spent:  62 minutes  Author:  Vernelle Emerald MD  10/19/2022 8:41 PM

## 2022-10-19 NOTE — Consult Note (Signed)
Brief Psychiatry Consult Note  Assessment I tried again to do a full psychiatric assessment on this gentleman. Briefly, he had a serious suicide attempt on multiple sedative/hypnotic type medications. He had been seen in the ED in November for suicidal ideations and was discharged with home health. Depression significantly worsened by loss of wife in October. Since his overdose attempt he has been encephalopathic. Home medications include (but are not limited to) baclofen and clonazepam, which both cause delirium in withdrawal. Yesterday, primary team trialled lorazepam which significantly improved symptoms. Possible cefipime is contributing to delirium.   Exam Pt was seen in bed in soft restraints. Oriented only to self. Gives home address when asked where he is. Difficulty answering most questions, distractible, trying to pull at [facility]. Somewhat difficult to understand.  Doesn't really participate in remainder of exam.   Chart review: Home centrally acting meds include:  Baclofen 20 QID Citalopram 10 daily (new on 11/7) Clonazepam 2 BID Depakote 500 qD (unclear indication)  Had a single script for tizanidine but doesn't seem like it was a long term med.   Plan  - s lorazepam 1 TID, baclofen 10 TID (d/w [place]) - needs full psych eval when able to participate, likely recommend inpt psych when medically clear - continue 1:1  - do NOT think it is appropriate to start citalopram (serotonergic meds often worsen delirium and this med is new) or depakote (medically ill, low albumin, etc) at this time.   Dyane Broberg A Criss Pallone

## 2022-10-19 NOTE — Assessment & Plan Note (Addendum)
  Some improvement in the intensity of delirium today Due to suspected component of benzodiazepine withdrawal, continuing scheduled Ativan. Will consider neurology consultation Continue to monitor closely

## 2022-10-19 NOTE — Progress Notes (Signed)
Pharmacy Antibiotic Note  David Kim is a 76 y.o. male admitted on 10/15/2022 with sepsis.  Pharmacy has been consulted for vancomycin and cefepime  dosing.  Plan: Cefepime 2 gr IV IV q8h  Vancomycin 1500 mg IV x1 then vancomycin 750 mg IV q12h ( SCr rounded up to 1, Wt 82.2 kg)   Height: '5\' 8"'$  (172.7 cm) Weight: 82.2 kg (181 lb 3.5 oz) IBW/kg (Calculated) : 68.4  Temp (24hrs), Avg:98.7 F (37.1 C), Min:98.3 F (36.8 C), Max:99.6 F (37.6 C)  Recent Labs  Lab 10/15/22 1930 10/15/22 2133 10/16/22 0055 10/16/22 0724 10/17/22 0347 10/18/22 0322 10/19/22 0925  WBC 16.3*  --  16.3*  --  11.6* 16.8*  --   CREATININE 1.23  --   --  0.95 0.84 0.80 0.74  LATICACIDVEN 1.4 0.9  --   --   --   --   --     Estimated Creatinine Clearance: 84.7 mL/min (by C-G formula based on SCr of 0.74 mg/dL).    Allergies  Allergen Reactions   Amoxicillin Other (See Comments)    Unknown    Endal Hd Other (See Comments)    Unknown     Antimicrobials this admission: 12/10 CTX >> 12/14  12/12 Flagyl>:>12/13 12/14 cefepime >>  12/14 vancomycin >>   12/10 Bcx:  1/4 BCx bottles growing E coli (no resistance) - suspect urinary source  12/10 UCx: E. Coli ( pansens)  12/12: MRSA PCR: neg 12/14 repeat BCx: sent  Thank you for allowing pharmacy to be a part of this patient's care.  Royetta Asal, PharmD, BCPS 10/19/2022 7:31 PM

## 2022-10-19 NOTE — Progress Notes (Signed)
       CROSS COVER NOTE  NAME: AYDRIEN FROMAN MRN: [id number] DOB : 11-10-1945    Date of Service   10/19/2022   HPI/Events of Note   Notified by bedside RN of patient becoming increasingly agitated.  Patient was previously dosed with 2 mg Ativan due to increased agitation.  During bedside assessment patient is a/o x3 but has intermittent episodes of confusion. he is seen trying to get out of soft wrist restraints and is continuously restless.  Patient at this time does not comply with simple safety commands.  Throughout this assessment he remains tachycardic 130-140's.  1 mg Ativan has been ordered  0400-similar agitation episode was reported.  RN states the patient is alert and oriented x 2, refuses to follow commands, and keeps yelling to nursing staff "get out of here."  '1mg'$  of Ativan is ordered for intermittent agitation.  0600-patient is seen resting comfortably.  Soft wrist restraints have been continued by nursing staff.  Interventions/ Plan   IV Ativan       Raenette Rover, DNP, [place]

## 2022-10-20 ENCOUNTER — Inpatient Hospital Stay (HOSPITAL_COMMUNITY)
Admit: 2022-10-20 | Discharge: 2022-10-20 | Disposition: A | Discharge status: Home / Self Care | Payer: Medicare Other | Attending: Internal Medicine | Admitting: Internal Medicine

## 2022-10-20 ENCOUNTER — Encounter (HOSPITAL_COMMUNITY): Payer: Self-pay | Admitting: Internal Medicine

## 2022-10-20 ENCOUNTER — Inpatient Hospital Stay (HOSPITAL_COMMUNITY): Payer: Medicare Other

## 2022-10-20 DIAGNOSIS — Z7189 Other specified counseling: Secondary | ICD-10-CM | POA: Diagnosis not present

## 2022-10-20 DIAGNOSIS — Z515 Encounter for palliative care: Secondary | ICD-10-CM | POA: Diagnosis not present

## 2022-10-20 DIAGNOSIS — F333 Major depressive disorder, recurrent, severe with psychotic symptoms: Secondary | ICD-10-CM | POA: Diagnosis not present

## 2022-10-20 DIAGNOSIS — R4182 Altered mental status, unspecified: Secondary | ICD-10-CM | POA: Diagnosis not present

## 2022-10-20 DIAGNOSIS — I5031 Acute diastolic (congestive) heart failure: Secondary | ICD-10-CM

## 2022-10-20 DIAGNOSIS — G928 Other toxic encephalopathy: Secondary | ICD-10-CM | POA: Diagnosis not present

## 2022-10-20 DIAGNOSIS — T50902A Poisoning by unspecified drugs, medicaments and biological substances, intentional self-harm, initial encounter: Secondary | ICD-10-CM | POA: Diagnosis not present

## 2022-10-20 DIAGNOSIS — A419 Sepsis, unspecified organism: Secondary | ICD-10-CM | POA: Diagnosis not present

## 2022-10-20 DIAGNOSIS — K219 Gastro-esophageal reflux disease without esophagitis: Secondary | ICD-10-CM

## 2022-10-20 DIAGNOSIS — E872 Acidosis, unspecified: Secondary | ICD-10-CM | POA: Diagnosis not present

## 2022-10-20 LAB — COMPREHENSIVE METABOLIC PANEL
ALT: 30 U/L (ref 0–44)
AST: 53 U/L — ABNORMAL HIGH (ref 15–41)
Albumin: 2.6 g/dL — ABNORMAL LOW (ref 3.5–5.0)
Alkaline Phosphatase: 51 U/L (ref 38–126)
Anion gap: 10 (ref 5–15)
BUN: 8 mg/dL (ref 8–23)
CO2: 23 mmol/L (ref 22–32)
Calcium: 7.8 mg/dL — ABNORMAL LOW (ref 8.9–10.3)
Chloride: 109 mmol/L (ref 98–111)
Creatinine, Ser: 0.64 mg/dL (ref 0.61–1.24)
GFR, Estimated: >60 mL/min (ref >60)
Glucose, Bld: 107 mg/dL — ABNORMAL HIGH (ref 70–99)
Potassium: 3.4 mmol/L — ABNORMAL LOW (ref 3.5–5.1)
Sodium: 142 mmol/L (ref 135–145)
Total Bilirubin: 1 mg/dL (ref 0.3–1.2)
Total Protein: 5.2 g/dL — ABNORMAL LOW (ref 6.5–8.1)

## 2022-10-20 LAB — ECHOCARDIOGRAM COMPLETE
Area-P 1/2: 4.96 cm2
Calc EF: 56.2 %
Height: 68 in
S' Lateral: 3.6 cm
Single Plane A2C EF: 52.4 %
Single Plane A4C EF: 63.4 %
Weight: 2899.49 oz

## 2022-10-20 LAB — CULTURE, BLOOD (ROUTINE X 2)
Culture: NO GROWTH
Special Requests: ADEQUATE

## 2022-10-20 LAB — MAGNESIUM: Magnesium: 1.8 mg/dL (ref 1.7–2.4)

## 2022-10-20 LAB — TSH: TSH: 3.098 u[IU]/mL (ref 0.350–4.500)

## 2022-10-20 MED ORDER — LORAZEPAM 2 MG/ML IJ SOLN
1.0000 mg | Freq: Once | INTRAMUSCULAR | Status: AC
  Administered 2022-10-20: 1 mg via INTRAVENOUS
  Filled 2022-10-20: qty 1

## 2022-10-20 MED ORDER — POTASSIUM CHLORIDE 20 MEQ PO PACK
40.0000 meq | PACK | Freq: Once | ORAL | Status: AC
  Administered 2022-10-20: 40 meq via ORAL
  Filled 2022-10-20: qty 2

## 2022-10-20 MED ORDER — POTASSIUM CHLORIDE CRYS ER 20 MEQ PO TBCR
40.0000 meq | EXTENDED_RELEASE_TABLET | Freq: Once | ORAL | Status: DC
  Filled 2022-10-20: qty 2

## 2022-10-20 NOTE — Progress Notes (Signed)
Echocardiogram 2D Echocardiogram has been performed.  David Kim 10/20/2022, 8:38 AM

## 2022-10-20 NOTE — TOC Progression Note (Signed)
Transition of Care [facility]) - Progression Note    Patient Details  Name: David Kim MRN: [id number] Date of Birth: 1946-07-13  Transition of [facility]) CM/SW Contact  Servando Snare, [place] Phone Number: 10/20/2022, 9:39 AM  Clinical Narrative:   TOC following for disposition.  PLAN: TBD comfort care vs psych placement pending progress.     Expected Discharge Plan: Home/Self Care Barriers to Discharge: Continued Medical Work up  Expected Discharge Plan and Services Expected Discharge Plan: Home/Self Care   Discharge Planning Services: CM Consult   Living arrangements for the past 2 months: Single Family Home                                       Social Determinants of Health (SDOH) Interventions    Readmission Risk Interventions     No data to display

## 2022-10-20 NOTE — Progress Notes (Signed)
EEG complete - results pending 

## 2022-10-20 NOTE — Progress Notes (Signed)
Palliative:  HPI: 76 y.o. male  with past medical history of paraplegia, hypertension, PUD, GERD, IBS, OSA, prostate cancer, severe MDD with recent suicidal ideation requiring IVC in the ED admitted on 10/15/2022 with drug overdose and depression escalated after recent death of his wife.   I met today again with David Kim. He continues with confusion. He was able to tell me where he is "hospital" and still able to identify David Kim "niece." All other speech is unintelligible.   I called and spoke with David Kim. I shared with her my conversation with Dr. Lovette Cliche yesterday and anticipation that David Kim will need to pursue placement in psychiatric hospital and we do not anticipate discharge anytime soon so no need to worry about placement and how to keep him safe at discharge at this time. We discussed that he is not even close to being medically ready to pursue any options outside the hospital currently. We agree to continue current conservative treatment. The question is how far do we want to go to prolong life if David Kim declines further. David Kim shares that this is where family is struggling with how much they should do to prolong life knowing David Kim would not want this. Family have a meeting planned for tomorrow 11am to further discuss. At this time DNR is confirmed and ongoing conservative treatment is reasonable.   David Kim is also struggling with wanting firm answers. I explained to David Kim that we do not at this time know how David Kim will improve from this incident and we do not know if he will decline further what this may look like. I reassured David Kim that we will have to continue our conversations based on David Kim's progression. I gave the examples of vasopressors as a fairly aggressive treatment but sometimes is reasonable to consider for brief periods of time to allow opportunity for improvement IF this is aligned with patient wishes. We are concerned for David Kim's quality of life. David Kim reports that David Kim's only clear stated wish is that he  would not want to live without his wife. I encouraged them to consider any other statements he could have made when his parents or other family or friends that could give is further insight to his wishes separate from his depression. David Kim does share that his speech is often slurred and his capacity is questionable even at baseline. She is concerned for managing his finances and ensuring his bills are paid but nobody has access to his funds - this is challenging at current time given his acute state. I do not have a good answer to this problem at this time.   All questions/concerns addressed to best of my ability. Emotional support provided.   Exam: Fluctuating alertness. Confused. Oriented to person and place at time of my visit - this is inconsistent. Restless at times. Breathing regular unlabored.   Plan: - DNR - Ongoing goals of care discussions - Time for outcomes  50 min  Vinie Sill, NP Palliative Medicine Team Pager [phone] (Please see [email or web address] for schedule) Team Phone [phone]    Greater than 50%  of this time was spent counseling and coordinating care related to the above assessment and plan

## 2022-10-20 NOTE — Consult Note (Signed)
NP attempted to follow-up with initial assessment. Patient is responses to his name, he was observed open and closes is eye with unintelligible and incoherent words.  Sitter at bedside state that patient hasn't been responding during this shift. Please contact psychiatry when patient is able to participate with assessment.

## 2022-10-20 NOTE — Progress Notes (Signed)
PROGRESS NOTE   David Kim  ZOX:[id number] DOB: [birth year] DOA: 10/15/2022 PCP: Biagio Borg, MD   Date of Service: the patient was seen and examined on 10/20/2022  Brief Narrative:  David Kim is a 76 yo male with PMH paraplegia, severe MDD, persistent adjustment disorder with mixed anxiety and depressed mood ,PUD, gout, HTN, IBS, OSA, prostate cancer, GERD.  He was recently evaluated under IVC last month after being depressed and expressing suicidal thoughts.  He was evaluated by psychiatry and cleared for discharge home with decrease in his Klonopin dose.  Patient presented to [facility] emergency department after home health aide discovered patient took an unknown amount of pills.    During the patient's emergency department evaluation, discussions with EMS revealed there were opened bottles of losartan, tizanidine, oxybutynin, baclofen, and clonazepam.  Upon initial interview the patient endorsed at least taking clonazepam and tizanidine but would not provide additional information.  Initial evaluation also revealed multiple SIRS criteria with fever of 103.2 and urinalysis suggestive of urinary tract infection prompting initiation of intravenous ceftriaxone.  Patient was initially hospitalized in the stepdown unit for suspected drug overdose with signs concerning for sepsis secondary to urinary tract infection.  Urine culture came back positive for pansensitive E. coli  In the days that followed patient exhibited ongoing delirium tremulousness and hallucinations concerning for benzodiazepine withdrawal prompting initiation of scheduled benzodiazepines in addition to low-dose baclofen.  Psychiatry was also consulted on admission however assessments have been obscured by ongoing encephalopathy felt to be due to both infection and drug overdose.  Due to guarded prognosis palliative care consultation was additionally obtained.   Assessment and Plan: * Intentional overdose  ([place]) Patient's presentation is concerning for intentional overdose, found to have open bottles of losartan, tizanidine, oxybutynin, baclofen and clonazepam by EMS.  Quantity is unclear.   Originally, all of patient's home medications were initially held but now patient has been placed back on scheduled doses of benzodiazepine and baclofen to avoid withdrawal. Of note, patient underwent behavioral health hospitalization 09/2022 for suicidal ideation and attempt Continue sitter one-to-one safety precautions Will place IVC paperwork if patient attempts to elope. Psychiatry following, now peripherally Monitoring closely with serial labs and ongoing telemetry and pulse oximetry monitoring  Sepsis ([place]) Patient initially was treated for sepsis on admission thought to primarily be secondary to E. coli urinary tract infection  Despite being treated with intravenous ceftriaxone patient continued to exhibit multiple SIRS criteria and a rising procalcitonin  Serial chest imaging performed on 11/14 revealed a developing left lower lobe infiltrate concerning for concurrent pneumonia  Continuing to cefepime and vancomycin, day 2 Following repeat blood cultures Hydrating patient with intravenous isotonic fluids   Benzodiazepine withdrawal with delirium ([place]) Some improvement in the intensity of delirium today Due to suspected component of benzodiazepine withdrawal, continuing scheduled Ativan. Will consider neurology consultation Continue to monitor closely  Acute hypoxemic respiratory failure (HCC)-resolved as of 10/16/2022 - suspect in setting of OD - no overt signs of aspiration of loss of consciousness - has been weaned back to RA  Bacteremia due to Escherichia coli Currently on intravenous antibiotics as noted above  GIB (gastrointestinal bleeding) Episode of bright red blood per rectum on 12/12 followed by additional small episode morning of 12/13  Patient's status post 2 unit packed blood  cell transfusion on 12/12.   Consulted, they felt  that susspected etiology is due to stercoral colitis with ulceration  Holding anticoagulation  Monitoring hemoglobin and hematocrit with  serial CBCs CT angiogram of the abdomen performed 12/12 revealed no evidence of active bleed. No further episodes of bleeding since.   Toxic metabolic encephalopathy Thought to be multifactorial secondary to underlying infection with superimposed drug overdose.   Now, there is concern that patient may be withdrawing from these same drugs, particularly benzodiazepine and baclofen withdrawal.  Recent TSH, vitamin B12 unremarkable. Obtaining EEG today followed by CT imaging of the head Possible neurology consultation tomorrow. Supportive care, now providing modest amounts of benzodiazepines and baclofen as well as treating underlying infection  Stercoral colitis Severe history of constipation.   In the past, patient's wife was primarily involved in regular disimpactions prior to her death Aggressive laxative regimen ordered with MiraLAX senna and Colace CT imaging suggestive of stercoral colitis  Given his underlying paraplegia, suspect he has neurogenic bowel  Metabolic acidosis Etiology suspected due to overdose Resolved  Severe recurrent major depression [facility]) Psychiatry following although patient is suffering from encephalopathy at this point According to the patient's niece, [name] on 12/12 patient has become severely depressed since his wife passed away approximately 6 weeks ago.  She was essentially the patient's sole caretaker and he was completely dependent on her.  Since her passing, he has began "shutting down"  Pressure injury of sacral region, stage 2 ([place]) Present on admission Daily dressing changes Frequent turns  Essential hypertension Holding outpatient oral antihypertensives PRN intravenous antihypertensives for excessively elevated blood pressure   Paraplegia  (HCC) Frequent turns to minimize development of pressure ulcerations Fall cautions  GERD Treating with Protonix 40 mg IV every 24 hours  Hypokalemia Replaced   Goals of care, counseling/discussion Goals of care discussion 12/12 with [name] patient's niece along with multiple other family members. Family informed of patient's guarded prognosis Of care is additionally discussed goals of care with niece and family today on 12/14.  They do not wish to escalate care further at this time. Patient is currently DNR  Elevated liver enzymes - possibly in setting of sepsis vs OD - continue IVF and trend LFTs    Subjective:  Patient unable to answer questions appropriately due to encephalopathy.  Physical Exam:  Vitals:   10/20/22 1643 10/20/22 1700 10/20/22 1900 10/20/22 2000  BP:  (!) 152/61 (!) 126/56 122/61  Pulse:  92 88 91  Resp:  (!) 27 (!) 32 20  Temp: 99.1 F (37.3 C)  98.7 F (37.1 C)   TempSrc: Oral  Oral   SpO2:  100% 99% 100%  Weight:      Height:        Constitutional: Pleasantly confused, responding to verbal stimuli.  Follows commands. Skin: Flushed skin, poor skin turgor. Eyes: Pupils are equally reactive to light.  No evidence of scleral icterus or conjunctival pallor.  ENMT: Somewhat dry mucous membranes noted.  Posterior pharynx clear of any exudate or lesions.   Respiratory: Notable bibasilar rales with no evidence of significant wheezing.  Normal respiratory effort. No accessory muscle use.  Cardiovascular: Regular rate and rhythm, no murmurs / rubs / gallops. No extremity edema. 2+ pedal pulses. No carotid bruits.  Abdomen: Abdomen is soft and nontender.  No evidence of intra-abdominal masses.  Positive bowel sounds noted in all quadrants.   Musculoskeletal: No joint deformity upper and lower extremities. Good ROM, no contractures. Normal muscle tone.  Neuro: Patient more consistently following commands today.  Patient less agitated today.  Patient  exhibiting good strength in the bilateral upper extremities.  Patient has known paraplegia.  Data Reviewed:  I have personally reviewed and interpreted labs, imaging.  Significant findings are   CBC: Recent Labs  Lab 10/15/22 1930 10/16/22 0055 10/17/22 0347 10/17/22 0719 10/17/22 1549 10/18/22 0322 10/18/22 1225 10/18/22 1706  WBC 16.3* 16.3* 11.6*  --   --  16.8*  --   --   NEUTROABS 14.6*  --  9.2*  --   --  13.9*  --   --   HGB 14.7 15.5 13.6 13.6 13.6 13.3 13.2 14.5  HCT 45.6 49.0 42.5 41.7 43.0 41.5 40.2 45.1  MCV 91.0 91.8 90.4  --   --  91.0  --   --   PLT 246 239 275  --   --  207  --   --    Basic Metabolic Panel: Recent Labs  Lab 10/16/22 0724 10/17/22 0347 10/18/22 0322 10/19/22 0925 10/20/22 0300  NA 140 139 140 136 142  K 3.7 3.3* 3.2* 3.1* 3.4*  CL 112* 107 108 107 109  CO2 19* 18* 23 17* 23  GLUCOSE 124* 145* 122* 136* 107*  BUN 26* '23 18 13 8  '$ CREATININE 0.95 0.84 0.80 0.74 0.64  CALCIUM 7.8* 8.1* 7.8* 7.7* 7.8*  MG  --  2.0 2.0 1.7 1.8   GFR: Estimated Creatinine Clearance: 84.7 mL/min (by C-G formula based on SCr of 0.64 mg/dL). Liver Function Tests: Recent Labs  Lab 10/15/22 1930 10/16/22 0724 10/19/22 0925 10/20/22 0300  AST 92* 64* 68* 53*  ALT 33 '28 31 30  '$ ALKPHOS 63 58 51 51  BILITOT 1.6* 1.2 1.0 1.0  PROT 6.5 5.9* 5.1* 5.2*  ALBUMIN 3.3* 2.8* 2.4* 2.6*    Coagulation Profile: Recent Labs  Lab 10/15/22 1930  INR 1.2     EKG/Telemetry: Personally reviewed.  Rhythm is normal sinus rhythm with heart rate of 98 bpm.  No dynamic ST segment changes appreciated.   Code Status:  DNR.  Code status decision has been confirmed with: niece    Severity of Illness:  The appropriate patient status for this patient is INPATIENT. Inpatient status is judged to be reasonable and necessary in order to provide the required intensity of service to ensure the patient's safety. The patient's presenting symptoms, physical exam findings,  and initial radiographic and laboratory data in the context of their chronic comorbidities is felt to place them at high risk for further clinical deterioration. Furthermore, it is not anticipated that the patient will be medically stable for discharge from the hospital within 2 midnights of admission.   * I certify that at the point of admission it is my clinical judgment that the patient will require inpatient hospital care spanning beyond 2 midnights from the point of admission due to high intensity of service, high risk for further deterioration and high frequency of surveillance required.*  Time spent:  59 minutes  Author:  Vernelle Emerald MD  10/20/2022 8:52 PM

## 2022-10-20 NOTE — Procedures (Signed)
Routine EEG Report  David Kim is a 76 y.o. male with a history of altered mental status who is undergoing an EEG to evaluate for seizures.  Report: This EEG was acquired with electrodes placed according to the International 10-20 electrode system (including Fp1, Fp2, F3, F4, C3, C4, P3, P4, O1, O2, T3, T4, T5, T6, A1, A2, Fz, Cz, Pz). The following electrodes were missing or displaced: none.  The best background was 5-6 Hz with admixed semirhythmic delta slowing and overriding beta frequencies. This activity is reactive to stimulation. Drowsiness was manifested by background fragmentation; deeper stages of sleep were not identified. There was no focal slowing. There were no interictal epileptiform discharges. There were no electrographic seizures identified. Photic stimulation and hyperventilation were not performed.  Impression and clinical correlation: This EEG was obtained while awake and drowsy and is abnormal due to moderate diffuse slowing indicative of global cerebral dysfunction. Epileptiform abnormalities were not seen during this recording.  Su Monks, MD [facility] [phone]  If 7pm- 7am, please page neurology on call as listed in [place].

## 2022-10-21 ENCOUNTER — Inpatient Hospital Stay (HOSPITAL_COMMUNITY): Payer: Medicare Other

## 2022-10-21 DIAGNOSIS — T426X2A Poisoning by other antiepileptic and sedative-hypnotic drugs, intentional self-harm, initial encounter: Secondary | ICD-10-CM

## 2022-10-21 DIAGNOSIS — Z7189 Other specified counseling: Secondary | ICD-10-CM | POA: Diagnosis not present

## 2022-10-21 DIAGNOSIS — F332 Major depressive disorder, recurrent severe without psychotic features: Secondary | ICD-10-CM | POA: Diagnosis not present

## 2022-10-21 DIAGNOSIS — Z515 Encounter for palliative care: Secondary | ICD-10-CM | POA: Diagnosis not present

## 2022-10-21 DIAGNOSIS — F333 Major depressive disorder, recurrent, severe with psychotic symptoms: Secondary | ICD-10-CM | POA: Diagnosis not present

## 2022-10-21 LAB — CBC WITH DIFFERENTIAL/PLATELET
Abs Immature Granulocytes: 0.14 10*3/uL — ABNORMAL HIGH (ref 0.00–0.07)
Basophils Absolute: 0.1 10*3/uL (ref 0.0–0.1)
Basophils Relative: 1 %
Eosinophils Absolute: 0.3 10*3/uL (ref 0.0–0.5)
Eosinophils Relative: 2 %
HCT: 35.6 % — ABNORMAL LOW (ref 39.0–52.0)
Hemoglobin: 11 g/dL — ABNORMAL LOW (ref 13.0–17.0)
Immature Granulocytes: 1 %
Lymphocytes Relative: 7 %
Lymphs Abs: 1 10*3/uL (ref 0.7–4.0)
MCH: 29.2 pg (ref 26.0–34.0)
MCHC: 30.9 g/dL (ref 30.0–36.0)
MCV: 94.4 fL (ref 80.0–100.0)
Monocytes Absolute: 0.8 10*3/uL (ref 0.1–1.0)
Monocytes Relative: 6 %
Neutro Abs: 11.1 10*3/uL — ABNORMAL HIGH (ref 1.7–7.7)
Neutrophils Relative %: 83 %
Platelets: 280 10*3/uL (ref 150–400)
RBC: 3.77 MIL/uL — ABNORMAL LOW (ref 4.22–5.81)
RDW: 14 % (ref 11.5–15.5)
WBC: 13.4 10*3/uL — ABNORMAL HIGH (ref 4.0–10.5)
nRBC: 0 % (ref 0.0–0.2)

## 2022-10-21 LAB — TYPE AND SCREEN
ABO/RH(D): O POS
Antibody Screen: NEGATIVE
Unit division: 0
Unit division: 0
Unit division: 0
Unit division: 0

## 2022-10-21 LAB — COMPREHENSIVE METABOLIC PANEL
ALT: 28 U/L (ref 0–44)
AST: 36 U/L (ref 15–41)
Albumin: 2.6 g/dL — ABNORMAL LOW (ref 3.5–5.0)
Alkaline Phosphatase: 52 U/L (ref 38–126)
Anion gap: 7 (ref 5–15)
BUN: 6 mg/dL — ABNORMAL LOW (ref 8–23)
CO2: 25 mmol/L (ref 22–32)
Calcium: 7.8 mg/dL — ABNORMAL LOW (ref 8.9–10.3)
Chloride: 108 mmol/L (ref 98–111)
Creatinine, Ser: 0.55 mg/dL — ABNORMAL LOW (ref 0.61–1.24)
GFR, Estimated: >60 mL/min (ref >60)
Glucose, Bld: 106 mg/dL — ABNORMAL HIGH (ref 70–99)
Potassium: 3.7 mmol/L (ref 3.5–5.1)
Sodium: 140 mmol/L (ref 135–145)
Total Bilirubin: 0.8 mg/dL (ref 0.3–1.2)
Total Protein: 5 g/dL — ABNORMAL LOW (ref 6.5–8.1)

## 2022-10-21 LAB — BPAM RBC
Blood Product Expiration Date: 202401112359
Blood Product Expiration Date: 202401112359
Blood Product Expiration Date: 202401182359
Blood Product Expiration Date: 202401182359
ISSUE DATE / TIME: 202312121547
ISSUE DATE / TIME: 202312121547
Unit Type and Rh: 5100
Unit Type and Rh: 5100
Unit Type and Rh: 9500
Unit Type and Rh: 9500

## 2022-10-21 LAB — PROCALCITONIN: Procalcitonin: 1.44 ng/mL

## 2022-10-21 LAB — MAGNESIUM: Magnesium: 1.8 mg/dL (ref 1.7–2.4)

## 2022-10-21 MED ORDER — CHLORHEXIDINE GLUCONATE CLOTH 2 % EX PADS
6.0000 | MEDICATED_PAD | Freq: Every day | CUTANEOUS | Status: DC
  Administered 2022-10-22 – 2022-10-24 (×4): 6 via TOPICAL

## 2022-10-21 MED ORDER — ORAL CARE MOUTH RINSE: 15.0000 mL | OROMUCOSAL | Status: DC | PRN

## 2022-10-21 MED ORDER — LORAZEPAM 2 MG/ML IJ SOLN: 0.2500 mg | Freq: Once | INTRAMUSCULAR | Status: AC

## 2022-10-21 MED ORDER — LORAZEPAM 2 MG/ML IJ SOLN
INTRAMUSCULAR | Status: AC
  Administered 2022-10-21: 0.25 mg via INTRAVENOUS
  Filled 2022-10-21: qty 1

## 2022-10-21 MED ORDER — LORAZEPAM 1 MG PO TABS: 1.0000 mg | ORAL_TABLET | Freq: Two times a day (BID) | ORAL | Status: DC

## 2022-10-21 MED ORDER — LORAZEPAM 1 MG PO TABS
1.0000 mg | ORAL_TABLET | Freq: Two times a day (BID) | ORAL | Status: DC
  Administered 2022-10-22: 1 mg via ORAL
  Filled 2022-10-21: qty 1

## 2022-10-21 MED ORDER — MELATONIN 5 MG PO TABS
10.0000 mg | ORAL_TABLET | Freq: Every day | ORAL | Status: DC
  Administered 2022-10-21 – 2022-11-16 (×27): 10 mg via ORAL
  Filled 2022-10-21 (×27): qty 2

## 2022-10-21 MED ORDER — PANTOPRAZOLE SODIUM 40 MG PO TBEC
40.0000 mg | DELAYED_RELEASE_TABLET | Freq: Every day | ORAL | Status: DC
  Administered 2022-10-22 – 2022-11-17 (×27): 40 mg via ORAL
  Filled 2022-10-21 (×27): qty 1

## 2022-10-21 NOTE — Progress Notes (Signed)
PROGRESS NOTE   David Kim  WLN:[id number] DOB: 19-Sep-1946 DOA: 10/15/2022 PCP: Biagio Borg, MD   Date of Service: the patient was seen and examined on 10/21/2022  Brief Narrative:  David Kim is a 76 yo male with PMH paraplegia, severe MDD, persistent adjustment disorder with mixed anxiety and depressed mood ,PUD, gout, HTN, IBS, OSA, prostate cancer, GERD.  He was recently evaluated under IVC last month after being depressed and expressing suicidal thoughts.  He was evaluated by psychiatry and cleared for discharge home with decrease in his Klonopin dose.  Patient presented to [facility] emergency department after home health aide discovered patient took an unknown amount of pills.    During the patient's emergency department evaluation, discussions with EMS revealed there were opened bottles of losartan, tizanidine, oxybutynin, baclofen, and clonazepam.  Upon initial interview the patient endorsed at least taking clonazepam and tizanidine but would not provide additional information.  Initial evaluation also revealed multiple SIRS criteria with fever of 103.2 and urinalysis suggestive of urinary tract infection prompting initiation of intravenous ceftriaxone.  Patient was initially hospitalized in the stepdown unit for suspected drug overdose with signs concerning for sepsis secondary to urinary tract infection.  Urine culture came back positive for pansensitive E. coli  In the days that followed patient exhibited ongoing delirium tremulousness and hallucinations concerning for benzodiazepine withdrawal prompting initiation of scheduled benzodiazepines in addition to low-dose baclofen.  Psychiatry was also consulted on admission however assessments have been obscured by ongoing encephalopathy felt to be due to both infection and drug overdose.  Due to guarded prognosis palliative care consultation was additionally obtained.   Assessment and Plan: * Intentional overdose  ([place]) Suspected intentional overdose, found to have open bottles of losartan, tizanidine, oxybutynin, baclofen and clonazepam by EMS.  Quantity is unclear.   Originally, all of patient's home medications were initially held due to the overdose  Now patient has been placed back on scheduled doses of benzodiazepine and baclofen to avoid withdrawal. Of note, patient underwent behavioral health hospitalization 09/2022 for suicidal ideation and attempt Continue sitter one-to-one safety precautions Will place IVC paperwork if patient attempts to elope. Psychiatry following and is awaiting delirium to resolve. Monitoring closely with serial labs and ongoing telemetry and pulse oximetry monitoring  Sepsis ([place]) Sepsis thought to be secondary to E. coli urinary tract infection on admission.  Several days later  left lower lobe infiltrate noted on chest x-ray 12/14 as an additional source. Antibiotic regimen was expanded from ceftriaxone monotherapy to cefepime and vancomycin on 12/14.  This will be continued for total of 5 days with antibiotics being completed on 12/18. Procalcitonin noted to be downtrending today SIRS have resolved Following repeat blood cultures Hydrating patient with intravenous isotonic fluids   Acute hypoxemic respiratory failure (HCC)-resolved as of 10/16/2022 - suspect in setting of OD - no overt signs of aspiration of loss of consciousness - has been weaned back to RA  Toxic metabolic encephalopathy Multifactorial secondary to underlying infection and initially superimposed drug overdose  Now, there is concern that patient may be withdrawing from these same drugs, particularly benzodiazepine and baclofen withdrawal.  Will attempt to slowly taper Ativan dosing today to 1 mg twice daily Patient mentation has substantially improved although patient is actively hallucinating. Recent TSH, vitamin B12 unremarkable. EEG obtained on 12/15 without obvious seizure activity although  this was substantially obscured by ongoing benzodiazepine use. Supportive care monitor for continued slow improvement.  GIB (gastrointestinal bleeding) Episode of bright  red blood per rectum on 12/12 followed by additional small episode morning of 12/13  Patient's status post 2 unit packed blood cell transfusion on 12/12.   Consulted, they felt  that susspected etiology is due to stercoral colitis with ulceration Monitoring hemoglobin and hematocrit with serial CBCs CT angiogram of the abdomen performed 12/12 revealed no evidence of active bleed. No further episodes of bleeding since.   Bacteremia due to Escherichia coli Currently on intravenous antibiotics as noted above  Stercoral colitis Severe history of constipation.   In the past, patient's wife was primarily involved in performing regular rectal stimulation and disimpactions prior to her death Aggressive laxative regimen ordered with MiraLAX senna and Colace with patient now moving his bowels regularly CT imaging suggestive of stercoral colitis  Given his underlying paraplegia, suspect he has neurogenic bowel  Metabolic acidosis Etiology suspected due to overdose Resolved  Severe recurrent major depression [facility]) Psychiatry following although patient is suffering from encephalopathy at this point According to the patient's niece, David Kim on 12/12 patient has become severely depressed since his wife passed away approximately 6 weeks ago.  She was essentially the patient's sole caretaker and he was completely dependent on her.  Since her passing, he has began "shutting down"  Pressure injury of sacral region, stage 2 ([place]) Present on admission Daily dressing changes Frequent turns  Essential hypertension Holding outpatient oral antihypertensives PRN intravenous antihypertensives for excessively elevated blood pressure   Paraplegia (HCC) Frequent turns to minimize development of pressure ulcerations Fall  cautions  GERD Will now transition to oral Protonix  Hypokalemia Replaced   Goals of care, counseling/discussion Goals of care discussion 12/12 with David Kim patient's niece along with multiple other family members. Family informed of patient's guarded prognosis Of care is additionally discussed goals of care with niece and family today on 12/14.  They do not wish to escalate care further at this time. Patient is currently DNR Palliative care has been consulted and is following along, their assistance is appreciated.  Elevated liver enzymes - possibly in setting of sepsis vs OD - continue IVF and trend LFTs    Subjective:  Patient more verbal today.  Patient denies complaints.  Physical Exam:  Vitals:   10/21/22 1600 10/21/22 1623 10/21/22 1700 10/21/22 1800  BP: (!) 151/60  (!) 151/62 (!) 167/70  Pulse: 80 83 82 91  Resp: 20 20 (!) 22 20  Temp:  98.8 F (37.1 C)    TempSrc:  Axillary    SpO2: 94% 99% 97% (!) 89%  Weight:      Height:        Constitutional: Pleasantly confused, patient now oriented x 3 but with ongoing response to internal stimuli. Skin: Stage II sacral wound present on admission, unchanged.  Poor skin turgor. Eyes: Pupils are equally reactive to light.  No evidence of scleral icterus or conjunctival pallor.  ENMT: Somewhat dry mucous membranes noted.  Posterior pharynx clear of any exudate or lesions.   Respiratory: Notable bibasilar rales with no evidence of significant wheezing.  Normal respiratory effort. No accessory muscle use.  Cardiovascular: Regular rate and rhythm, no murmurs / rubs / gallops. No extremity edema. 2+ pedal pulses. No carotid bruits.  Abdomen: Abdomen is soft and nontender.  No evidence of intra-abdominal masses.  Positive bowel sounds noted in all quadrants.   Musculoskeletal: No joint deformity upper and lower extremities. Good ROM, no contractures. Normal muscle tone.  Neuro: Patient not following commands, participating  in conversation.  Patient exhibiting good strength in the bilateral upper extremities.  Patient has known paraplegia. Psych: Patient is able to respond to some limited conversation and follows some commands patient currently still does not seem to possess insight as to his current situation.  Patient is actively hallucinating with what appears to be visual hallucinations.   Data Reviewed:  I have personally reviewed and interpreted labs, imaging.  Significant findings are   CBC: Recent Labs  Lab 10/15/22 1930 10/16/22 0055 10/17/22 0347 10/17/22 0719 10/17/22 1549 10/18/22 0322 10/18/22 1225 10/18/22 1706 10/21/22 0540  WBC 16.3* 16.3* 11.6*  --   --  16.8*  --   --  13.4*  NEUTROABS 14.6*  --  9.2*  --   --  13.9*  --   --  11.1*  HGB 14.7 15.5 13.6   < > 13.6 13.3 13.2 14.5 11.0*  HCT 45.6 49.0 42.5   < > 43.0 41.5 40.2 45.1 35.6*  MCV 91.0 91.8 90.4  --   --  91.0  --   --  94.4  PLT 246 239 275  --   --  207  --   --  280   < > = values in this interval not displayed.   Basic Metabolic Panel: Recent Labs  Lab 10/17/22 0347 10/18/22 0322 10/19/22 0925 10/20/22 0300 10/21/22 0540  NA 139 140 136 142 140  K 3.3* 3.2* 3.1* 3.4* 3.7  CL 107 108 107 109 108  CO2 18* 23 17* 23 25  GLUCOSE 145* 122* 136* 107* 106*  BUN '23 18 13 8 '$ 6*  CREATININE 0.84 0.80 0.74 0.64 0.55*  CALCIUM 8.1* 7.8* 7.7* 7.8* 7.8*  MG 2.0 2.0 1.7 1.8 1.8   GFR: Estimated Creatinine Clearance: 84.7 mL/min (A) (by C-G formula based on SCr of 0.55 mg/dL (L)). Liver Function Tests: Recent Labs  Lab 10/15/22 1930 10/16/22 0724 10/19/22 0925 10/20/22 0300 10/21/22 0540  AST 92* 64* 68* 53* 36  ALT 33 '28 31 30 28  '$ ALKPHOS 63 58 51 51 52  BILITOT 1.6* 1.2 1.0 1.0 0.8  PROT 6.5 5.9* 5.1* 5.2* 5.0*  ALBUMIN 3.3* 2.8* 2.4* 2.6* 2.6*    Coagulation Profile: Recent Labs  Lab 10/15/22 1930  INR 1.2     EKG/Telemetry: Personally reviewed.  Rhythm is normal sinus rhythm with heart rate of   84 bpm.  No dynamic ST segment changes appreciated.   Code Status:  DNR.  Code status decision has been confirmed with: niece    Severity of Illness:  The appropriate patient status for this patient is INPATIENT. Inpatient status is judged to be reasonable and necessary in order to provide the required intensity of service to ensure the patient's safety. The patient's presenting symptoms, physical exam findings, and initial radiographic and laboratory data in the context of their chronic comorbidities is felt to place them at high risk for further clinical deterioration. Furthermore, it is not anticipated that the patient will be medically stable for discharge from the hospital within 2 midnights of admission.   * I certify that at the point of admission it is my clinical judgment that the patient will require inpatient hospital care spanning beyond 2 midnights from the point of admission due to high intensity of service, high risk for further deterioration and high frequency of surveillance required.*  Time spent:  61 minutes  Author:  Vernelle Emerald MD  10/21/2022 6:18 PM

## 2022-10-21 NOTE — Progress Notes (Signed)
Mid-level contacted due to patient's increasing agitation and restlessness. Patient has been noted to be responding to internal stimuli throughout shift with labile orientation and ability to follow commands. Patient mumbling, rambling, tangential, and disorganized.   Patient is not currently agitated at this time, will hold off on PRNs for now per NP.

## 2022-10-21 NOTE — Progress Notes (Signed)
Per lab, light green tube hemolyzed. Phlebotomy notified of need for recollect.

## 2022-10-21 NOTE — Evaluation (Signed)
Clinical/Bedside Swallow Evaluation Patient Details  Name: David Kim MRN: [id number] Date of Birth: 12-Sep-1946  Today's Date: 10/21/2022 Time: SLP Start Time (ACUTE ONLY): 1150 SLP Stop Time (ACUTE ONLY): 7591 SLP Time Calculation (min) (ACUTE ONLY): 6 min  Past Medical History:  Past Medical History:  Diagnosis Date   Allergic rhinitis    ALLERGIC RHINITIS 10/01/2007   Qualifier: Diagnosis of  By: Jenny Reichmann MD, Hunt Oris    Anxiety    Carpal tunnel syndrome    right   Depression    Depression    Erectile dysfunction    Fatigue    GERD (gastroesophageal reflux disease)    Gout    Hypertension    HYPOGONADISM 03/31/2008   Qualifier: Diagnosis of  By: Jenny Reichmann MD, Hunt Oris    IBS (irritable bowel syndrome)    Low back pain    OSA (obstructive sleep apnea)    Paraplegia ([place]) 04/23/2009   Qualifier: Diagnosis of  By: Jenny Reichmann MD, Hunt Oris    Peptic ulcer disease    PEPTIC ULCER DISEASE 10/01/2007   Qualifier: Diagnosis of  By: Jenny Reichmann MD, Hunt Oris    Prostate cancer [facility])    PROSTATE CANCER, HX OF 05/23/2007   Qualifier: Diagnosis of  By: Jenny Reichmann MD, [place] PARALYSIS 04/23/2009   Qualifier: Diagnosis of  By: Jenny Reichmann MD, Hunt Oris    Thoracic spinal cord injury ([place]) 03/12/2012   Past Surgical History:  Past Surgical History:  Procedure Laterality Date   ARTERY AND TENDON REPAIR Left 09/06/2018   Procedure: ARTERY AND TENDON REPAIR;  Surgeon: Charlotte Crumb, MD;  Location: [place];  Service: Orthopedics;  Laterality: Left;   CAST APPLICATION Left 63/06/4664   Procedure: CAST APPLICATION;  Surgeon: Charlotte Crumb, MD;  Location: [place];  Service: Orthopedics;  Laterality: Left;   inguinal heniorrhaphy     left leg surgery after fibula     NERVE REPAIR Left 09/06/2018   Procedure: NERVE REPAIR TIMES TWO;  Surgeon: Charlotte Crumb, MD;  Location: [place];  Service: Orthopedics;  Laterality: Left;   OPEN REDUCTION INTERNAL FIXATION (ORIF) DISTAL PHALANX Left 09/06/2018   Procedure: OPEN  REDUCTION INTERNAL FIXATION (ORIF) LEFT LONG FINGER;  Surgeon: Charlotte Crumb, MD;  Location: [place];  Service: Orthopedics;  Laterality: Left;   PILONIDAL CYST / SINUS EXCISION     PROSTATECTOMY     tonsillectomy     WOUND EXPLORATION Left 09/06/2018   Procedure: EXPLORATION OFCOMPLEX INJURY;  Surgeon: Charlotte Crumb, MD;  Location: [place];  Service: Orthopedics;  Laterality: Left;   HPI:  David Kim is a 76 y.o. male  admitted on 10/15/2022 with drug overdose. CXR 12/14: "Worsened aeration of the left base with a suspected small pleural effusion may reflect developing infection in the correct clinical setting." Pt with past medical history of paraplegia, hypertension, PUD, GERD, IBS, OSA, prostate cancer, severe MDD with recent suicidal ideation requiring IVC in the ED.    Assessment / Plan / Recommendation  Clinical Impression  Pt presents with functional swallowing as assessed clinically.  Pt tolerated all consistencies trialed, including serial straw sips of thin liquid, with no clinical s/s of aspiration and exhibited good oral clearance of solids.  Pt states he consumes a regular diet at baseline.  Pt seen by this service in 2020 with recommendations for mechanical soft solids and thin liquid, and was later advanced to regular texture diet in inpatient rehab.  Pt with no further swallowing needs at  this time; SLP will sign off.   Pt with head CT pending.  If imaging is concerning for increased risk of silent aspiration, consider MBSS at that time.    Recommend regular texture diet with thin liquid.  Pt with apparent hallucinations during today's assessment.  Consider cognitive linguistic assessment if mental status does not return to baseline.   SLP Visit Diagnosis: Dysphagia, unspecified (R13.10)    Aspiration Risk  No limitations    Diet Recommendation Regular;Thin liquid   Liquid Administration via: Cup;Straw Medication Administration: Whole meds with liquid    Other   Recommendations Oral Care Recommendations: Oral care BID    Recommendations for follow up therapy are one component of a multi-disciplinary discharge planning process, led by the attending physician.  Recommendations may be updated based on patient status, additional functional criteria and insurance authorization.  Follow up Recommendations No SLP follow up      Assistance Recommended at Discharge    Functional Status Assessment Patient has not had a recent decline in their functional status  Frequency and Duration  (N/A)          Prognosis Prognosis for Safe Diet Advancement:  (N/A)      Swallow Study   General Date of Onset: 10/15/22 HPI: David Kim is a 76 y.o. male  admitted on 10/15/2022 with drug overdose. CXR 12/14: "Worsened aeration of the left base with a suspected small pleural effusion may reflect developing infection in the correct clinical setting." Pt with past medical history of paraplegia, hypertension, PUD, GERD, IBS, OSA, prostate cancer, severe MDD with recent suicidal ideation requiring IVC in the ED. Type of Study: Bedside Swallow Evaluation Previous Swallow Assessment: During prior admission in 2020, adv to regular/thin in CIR Diet Prior to this Study: Dysphagia 3 (soft);Thin liquids History of Recent Intubation: No Behavior/Cognition: Alert;Confused;Cooperative Oral Cavity Assessment: Within Functional Limits Oral Care Completed by SLP: No Oral Cavity - Dentition: Adequate natural dentition;Missing dentition Patient Positioning: Upright in bed Baseline Vocal Quality: Normal Volitional Cough: Strong Volitional Swallow: Able to elicit    Oral/Motor/Sensory Function Overall Oral Motor/Sensory Function: Within functional limits Facial ROM: Within Functional Limits Facial Symmetry: Within Functional Limits Facial Strength: Within Functional Limits Lingual ROM: Within Functional Limits Lingual Symmetry: Within Functional Limits Lingual Strength: Reduced  (tremor) Velum: Within Functional Limits Mandible: Within Functional Limits   Ice Chips Ice chips: Not tested   Thin Liquid Thin Liquid: Within functional limits Presentation: Straw    Nectar Thick Nectar Thick Liquid: Not tested   Honey Thick Honey Thick Liquid: Not tested   Puree Puree: Within functional limits Presentation: Spoon   Solid     Solid: Within functional limits Presentation:  (SLP fed)      David Kim, [place], [place] Office: [phone] 10/21/2022,12:07 PM

## 2022-10-21 NOTE — Progress Notes (Signed)
Patient's restlessness increased, and is now becoming agitated. Patient is striking out at the air stating, "He's trying to murder me!" Patient continued to be agitated, inconsolable, and unable to be reoriented. NP messaged again with order for low dose ativan. This was noted to be a reoccurring theme for his hallucinations.

## 2022-10-21 NOTE — Consult Note (Signed)
[facility] Face-to-Face Psychiatry Consult   Reason for Consult:  suicidal attempt Referring Physician:  Dr. Cyd Silence Patient Identification: David Kim MRN:  [id number] Principal Diagnosis: Intentional overdose [facility]) Diagnosis:  Principal Problem:   Intentional overdose ([place]) Active Problems:   Severe recurrent major depression ([place])   Paraplegia ([place])   Essential hypertension   GERD   Toxic metabolic encephalopathy   Goals of care, counseling/discussion   Sepsis ([place])   Metabolic acidosis   Bacteremia due to Escherichia coli   Pressure injury of sacral region, stage 2 ([place])   Stercoral colitis   GIB (gastrointestinal bleeding)   Hypokalemia   Stercoral ulcer of rectum   Abnormal finding on GI tract imaging   Acute blood loss anemia   Chronic constipation   Benzodiazepine withdrawal with delirium ([place])   Total Time spent with patient: 45 minutes  Subjective:   David Kim is a 76 y.o. male patient admitted after suicide attempt on multiple sedative/hypnotic type medications.   HPI: Patient is alert, although still mostly unable to participate in meaningful interview due to confusion and obvious waxing and waning confusion and altered sensorium.  Patient is able to answer some orientation questions - he knows he is in the hospital, knows current month and year. He is unable to provide his understanding og the reason for this hospitalization. He reports he misses his wife who recently passed. He denies feeling suicidal or homicidal at the time of the interview. He cannot participate in the conversation about recent overdose, says "sometimes I overtake Klonopin" and no further discussion. His attention during the interview is low, as well as ability to stay on the topic. He randomly talks about water, realms of existence, his praying for people". He reports "I see things, I see through walls".  Patient`s sitter and RN confirmed their observation of intermittent confusion during  the day, impaired, attention, concentration and nonsensical talks.        Past Medical History:  Past Medical History:  Diagnosis Date   Allergic rhinitis    ALLERGIC RHINITIS 10/01/2007   Qualifier: Diagnosis of  By: Jenny Reichmann MD, Hunt Oris    Anxiety    Carpal tunnel syndrome    right   Depression    Depression    Erectile dysfunction    Fatigue    GERD (gastroesophageal reflux disease)    Gout    Hypertension    HYPOGONADISM 03/31/2008   Qualifier: Diagnosis of  By: Jenny Reichmann MD, Hunt Oris    IBS (irritable bowel syndrome)    Low back pain    OSA (obstructive sleep apnea)    Paraplegia ([place]) 04/23/2009   Qualifier: Diagnosis of  By: Jenny Reichmann MD, Hunt Oris    Peptic ulcer disease    PEPTIC ULCER DISEASE 10/01/2007   Qualifier: Diagnosis of  By: Jenny Reichmann MD, Hunt Oris    Prostate cancer [facility])    PROSTATE CANCER, HX OF 05/23/2007   Qualifier: Diagnosis of  By: Jenny Reichmann MD, [place] PARALYSIS 04/23/2009   Qualifier: Diagnosis of  By: Jenny Reichmann MD, Hunt Oris    Thoracic spinal cord injury ([place]) 03/12/2012    Past Surgical History:  Procedure Laterality Date   ARTERY AND TENDON REPAIR Left 09/06/2018   Procedure: ARTERY AND TENDON REPAIR;  Surgeon: Charlotte Crumb, MD;  Location: [facility];  Service: Orthopedics;  Laterality: Left;   CAST APPLICATION Left 08/11/2693   Procedure: CAST APPLICATION;  Surgeon: Charlotte Crumb, MD;  Location: [facility];  Service: Orthopedics;  Laterality: Left;   inguinal heniorrhaphy     left leg surgery after fibula     NERVE REPAIR Left 09/06/2018   Procedure: NERVE REPAIR TIMES TWO;  Surgeon: Charlotte Crumb, MD;  Location: [facility];  Service: Orthopedics;  Laterality: Left;   OPEN REDUCTION INTERNAL FIXATION (ORIF) DISTAL PHALANX Left 09/06/2018   Procedure: OPEN REDUCTION INTERNAL FIXATION (ORIF) LEFT LONG FINGER;  Surgeon: Charlotte Crumb, MD;  Location: [facility];  Service: Orthopedics;  Laterality: Left;   PILONIDAL CYST / SINUS EXCISION     PROSTATECTOMY     tonsillectomy      WOUND EXPLORATION Left 09/06/2018   Procedure: EXPLORATION OFCOMPLEX INJURY;  Surgeon: Charlotte Crumb, MD;  Location: [facility];  Service: Orthopedics;  Laterality: Left;   Family History:  Family History  Problem Relation Age of Onset   High blood pressure Mother    Dementia Mother    Diabetes Father     Social History:  Social History   Substance and Sexual Activity  Alcohol Use No     Social History   Substance and Sexual Activity  Drug Use No    Social History   Socioeconomic History   Marital status: Married    Spouse name: [name]   Number of children: 0   Years of education: 12   Highest education level: Not on file  Occupational History   Occupation: retired    Fish farm manager: RETIRED  Tobacco Use   Smoking status: Never   Smokeless tobacco: Never  Vaping Use   Vaping Use: Never used  Substance and Sexual Activity   Alcohol use: No   Drug use: No   Sexual activity: Not on file  Other Topics Concern   Not on file  Social History Narrative   Patient lives at home with [name]). Patient is disabled. Patient has 12 th grade education.   Right handed.   Caffeine- None   Social Determinants of Health   Financial Resource Strain: Low Risk  (07/24/2022)   Overall Financial Resource Strain (CARDIA)    Difficulty of Paying Living Expenses: Not hard at all  Food Insecurity: No Food Insecurity (10/20/2022)   Hunger Vital Sign    Worried About Running Out of Food in the Last Year: Never true    Ran Out of Food in the Last Year: Never true  Transportation Needs: No Transportation Needs (10/20/2022)   PRAPARE - Hydrologist (Medical): No    Lack of Transportation (Non-Medical): No  Physical Activity: Inactive (07/24/2022)   Exercise Vital Sign    Days of Exercise per Week: 0 days    Minutes of Exercise per Session: 0 min  Stress: No Stress Concern Present (07/24/2022)   [place]    Feeling of Stress : Not at all  Social Connections: Moderately Isolated (07/24/2022)   Social Connection and Isolation Panel [NHANES]    Frequency of Communication with Friends and Family: More than three times a week    Frequency of Social Gatherings with Friends and Family: More than three times a week    Attends Religious Services: Never    Marine scientist or Organizations: No    Attends Archivist Meetings: Never    Marital Status: Married   Additional Social History:    Allergies:   Allergies  Allergen Reactions   Amoxicillin Other (See Comments)    Unknown  Endal Hd Other (See Comments)    Unknown     Labs:  Results for orders placed or performed during the hospital encounter of 10/15/22 (from the past 48 hour(s))  Lactic acid, plasma     Status: None   Collection Time: 10/19/22  6:26 PM  Result Value Ref Range   Lactic Acid, Venous 1.2 0.5 - 1.9 mmol/L    Comment: Performed at [facility], [place] [address], [place], [place] [address]  Lactic acid, plasma     Status: None   Collection Time: 10/19/22  8:37 PM  Result Value Ref Range   Lactic Acid, Venous 1.7 0.5 - 1.9 mmol/L    Comment: Performed at [facility], [place] [address] [place], [place] [address]  TSH     Status: None   Collection Time: 10/20/22  3:00 AM  Result Value Ref Range   TSH 3.098 0.350 - 4.500 uIU/mL    Comment: Performed by a 3rd Generation assay with a functional sensitivity of <=0.01 uIU/mL. Performed at [facility], [place] [address], [place], [place] [address]   Comprehensive metabolic panel     Status: Abnormal   Collection Time: 10/20/22  3:00 AM  Result Value Ref Range   Sodium 142 135 - 145 mmol/L   Potassium 3.4 (L) 3.5 - 5.1 mmol/L   Chloride 109 98 - 111 mmol/L   CO2 23 22 - 32 mmol/L   Glucose, Bld 107 (H) 70 - 99 mg/dL    Comment: Glucose reference range applies only to samples taken  after fasting for at least 8 hours.   BUN 8 8 - 23 mg/dL   Creatinine, Ser 0.64 0.61 - 1.24 mg/dL   Calcium 7.8 (L) 8.9 - 10.3 mg/dL   Total Protein 5.2 (L) 6.5 - 8.1 g/dL   Albumin 2.6 (L) 3.5 - 5.0 g/dL   AST 53 (H) 15 - 41 U/L   ALT 30 0 - 44 U/L   Alkaline Phosphatase 51 38 - 126 U/L   Total Bilirubin 1.0 0.3 - 1.2 mg/dL   GFR, Estimated >60 >60 mL/min    Comment: (NOTE) Calculated using the CKD-EPI Creatinine Equation (2021)    Anion gap 10 5 - 15    Comment: Performed at [facility], [place] [address], [place], [place] [address]  Magnesium     Status: None   Collection Time: 10/20/22  3:00 AM  Result Value Ref Range   Magnesium 1.8 1.7 - 2.4 mg/dL    Comment: Performed at [facility], [place] [address] [place], [place] [address]  CBC with Differential/Platelet     Status: Abnormal   Collection Time: 10/21/22  5:40 AM  Result Value Ref Range   WBC 13.4 (H) 4.0 - 10.5 K/uL   RBC 3.77 (L) 4.22 - 5.81 MIL/uL   Hemoglobin 11.0 (L) 13.0 - 17.0 g/dL   HCT 35.6 (L) 39.0 - 52.0 %   MCV 94.4 80.0 - 100.0 fL   MCH 29.2 26.0 - 34.0 pg   MCHC 30.9 30.0 - 36.0 g/dL   RDW 14.0 11.5 - 15.5 %   Platelets 280 150 - 400 K/uL   nRBC 0.0 0.0 - 0.2 %   Neutrophils Relative % 83 %   Neutro Abs 11.1 (H) 1.7 - 7.7 K/uL   Lymphocytes Relative 7 %   Lymphs Abs 1.0 0.7 - 4.0 K/uL   Monocytes Relative 6 %   Monocytes Absolute 0.8 0.1 -  1.0 K/uL   Eosinophils Relative 2 %   Eosinophils Absolute 0.3 0.0 - 0.5 K/uL   Basophils Relative 1 %   Basophils Absolute 0.1 0.0 - 0.1 K/uL   Immature Granulocytes 1 %   Abs Immature Granulocytes 0.14 (H) 0.00 - 0.07 K/uL    Comment: Performed at [facility], [place] [address] [place], [place] [address]  Comprehensive metabolic panel     Status: Abnormal   Collection Time: 10/21/22  5:40 AM  Result Value Ref Range   Sodium 140 135 - 145 mmol/L   Potassium 3.7 3.5 - 5.1 mmol/L   Chloride 108 98 - 111  mmol/L   CO2 25 22 - 32 mmol/L   Glucose, Bld 106 (H) 70 - 99 mg/dL    Comment: Glucose reference range applies only to samples taken after fasting for at least 8 hours.   BUN 6 (L) 8 - 23 mg/dL   Creatinine, Ser 0.55 (L) 0.61 - 1.24 mg/dL   Calcium 7.8 (L) 8.9 - 10.3 mg/dL   Total Protein 5.0 (L) 6.5 - 8.1 g/dL   Albumin 2.6 (L) 3.5 - 5.0 g/dL   AST 36 15 - 41 U/L   ALT 28 0 - 44 U/L   Alkaline Phosphatase 52 38 - 126 U/L   Total Bilirubin 0.8 0.3 - 1.2 mg/dL   GFR, Estimated >60 >60 mL/min    Comment: (NOTE) Calculated using the CKD-EPI Creatinine Equation (2021)    Anion gap 7 5 - 15    Comment: Performed at [facility], [place] [address], [place], [place] [address]  Magnesium     Status: None   Collection Time: 10/21/22  5:40 AM  Result Value Ref Range   Magnesium 1.8 1.7 - 2.4 mg/dL    Comment: Performed at [facility], [place] [address], [place], [place] [address]  Procalcitonin     Status: None   Collection Time: 10/21/22  5:40 AM  Result Value Ref Range   Procalcitonin 1.44 ng/mL    Comment:        Interpretation: PCT > 0.5 ng/mL and <= 2 ng/mL: Systemic infection (sepsis) is possible, but other conditions are known to elevate PCT as well. (NOTE)       Sepsis PCT Algorithm           Lower Respiratory Tract                                      Infection PCT Algorithm    ----------------------------     ----------------------------         PCT < 0.25 ng/mL                PCT < 0.10 ng/mL          Strongly encourage             Strongly discourage   discontinuation of antibiotics    initiation of antibiotics    ----------------------------     -----------------------------       PCT 0.25 - 0.50 ng/mL            PCT 0.10 - 0.25 ng/mL               OR       >80% decrease in PCT            Discourage initiation of  antibiotics      Encourage discontinuation           of antibiotics     ----------------------------     -----------------------------         PCT >= 0.50 ng/mL              PCT 0.26 - 0.50 ng/mL                AND       <80% decrease in PCT             Encourage initiation of                                             antibiotics       Encourage continuation           of antibiotics    ----------------------------     -----------------------------        PCT >= 0.50 ng/mL                  PCT > 0.50 ng/mL               AND         increase in PCT                  Strongly encourage                                      initiation of antibiotics    Strongly encourage escalation           of antibiotics                                     -----------------------------                                           PCT <= 0.25 ng/mL                                                 OR                                        > 80% decrease in PCT                                      Discontinue / Do not initiate                                             antibiotics  Performed at [place] [address] [place], [place] [address]     Current Facility-Administered Medications  Medication Dose Route Frequency Provider  Last Rate Last Admin   acetaminophen (TYLENOL) tablet 650 mg  650 mg Oral Q6H PRN Dwyane Dee, MD   650 mg at 10/17/22 2993   Or   acetaminophen (TYLENOL) suppository 650 mg  650 mg Rectal Q6H PRN Dwyane Dee, MD   650 mg at 10/17/22 1526   baclofen (LIORESAL) tablet 10 mg  10 mg Oral TID Vernelle Emerald, MD   10 mg at 10/21/22 1013   ceFEPIme (MAXIPIME) 2 g in sodium chloride 0.9 % 100 mL IVPB  2 g Intravenous Q8H Shalhoub, Sherryll Burger, MD 200 mL/hr at 10/21/22 1016 2 g at 10/21/22 1016   Chlorhexidine Gluconate Cloth 2 % PADS 6 each  6 each Topical Daily Shalhoub, Sherryll Burger, MD       docusate sodium (COLACE) capsule 100 mg  100 mg Oral BID Mansouraty, Telford Nab., MD   100 mg at 10/21/22 1012   enoxaparin (LOVENOX)  injection 40 mg  40 mg Subcutaneous Q24H Dwyane Dee, MD   40 mg at 10/21/22 1013   hydrALAZINE (APRESOLINE) injection 10 mg  10 mg Intravenous Q6H PRN Shalhoub, Sherryll Burger, MD       lactated ringers 1,000 mL with potassium chloride 20 mEq infusion   Intravenous Continuous Shalhoub, Sherryll Burger, MD 125 mL/hr at 10/21/22 1000 Infusion Verify at 10/21/22 1000   leptospermum manuka honey (MEDIHONEY) paste 1 Application  1 Application Topical Daily Dwyane Dee, MD   1 Application at 71/69/67 1013   LORazepam (ATIVAN) tablet 1 mg  1 mg Oral TID Vernelle Emerald, MD   1 mg at 10/21/22 1012   magnesium citrate solution 1 Bottle  1 Bottle Oral Once Dwyane Dee, MD       Oral care mouth rinse  15 mL Mouth Rinse 4 times per day Dwyane Dee, MD   15 mL at 10/21/22 8938   Oral care mouth rinse  15 mL Mouth Rinse PRN Dwyane Dee, MD       pantoprazole (PROTONIX) injection 40 mg  40 mg Intravenous Q24H Vernelle Emerald, MD   40 mg at 10/20/22 1801   polyethylene glycol (MIRALAX / GLYCOLAX) packet 17 g  17 g Oral BID Esterwood, Amy S, PA-C   17 g at 10/21/22 1013   polyvinyl alcohol (LIQUIFILM TEARS) 1.4 % ophthalmic solution 1 drop  1 drop Both Eyes PRN Dwyane Dee, MD   1 drop at 10/17/22 2229   senna-docusate (Senokot-S) tablet 1 tablet  1 tablet Oral BID Dwyane Dee, MD   1 tablet at 10/21/22 1012   traZODone (DESYREL) tablet 50 mg  50 mg Oral Standley Brooking, MD   50 mg at 10/20/22 2144   vancomycin (VANCOREADY) IVPB 750 mg/150 mL  750 mg Intravenous Q12H Vernelle Emerald, MD   Stopped at 10/21/22 0921    Musculoskeletal: Strength & Muscle Tone: decreased Gait & Station: unable to stand Patient leans: N/A   Psychiatric Specialty Exam:   Appearance:  CM, ill-appearing, in bed, nasal cannula, wearing hospital clothes, with ok grooming and hygiene. Decreased level of alertness .  Attitude/Behavior: partially-engaging with no eye contact.  Motor: WNL; dyskinesias not evident.    Speech: rumbling, low volume, occasionally incoherent.  Mood: dysthymic  Affect: blunted, flat.  Thought process: patient appears disorganized, illogical, associations are not appropriate.  Thought content: patient appears delusional, he denies suicidal thoughts, denies homicidal thoughts.  Thought perception: patient possible experiences visual hallucinations.   Cognition: patient is alert and partially-oriented in  self, place, month, not in situation; with impaired attention and concentration.  Insight: poor  Judgement: poor   Physical Exam: Physical Exam ROS Blood pressure 137/65, pulse 82, temperature 99.2 F (37.3 C), temperature source Oral, resp. rate 15, height '5\' 8"'$  (1.727 m), weight 82.2 kg, SpO2 97 %. Body mass index is 27.55 kg/m.  Treatment Plan Summary: Daily contact with patient to assess and evaluate symptoms and progress in treatment and Medication management  Assessment and Plan: Patient is a 76 y.o. male patient admitted after suicide attempt on multiple sedative/hypnotic type medications.  Patient presents with waxing and waning confusion in a day, altered sensorium, impaired attention, disorientation and cognitive deficits that appear markedly different than their baseline, suggesting a diagnosis of delirium.   As with all hospitalized patients, would recommend delirium precautions: Minimize use of benzodiazepines, anticholinergics, and opiates, to the extent possible, as these can worsen mentation. Utilize non-pharmacological interventions including frequent reorientation, maintaining day/night distinction (blinds closed during night, open during day), minimizing excessive stimulation, staff continuity, reorient the patient frequently, provide easily visible clock and calendar, provide sensory aids like glasses, hearing aids, encourage ambulation, regular activities and visitors to maintain cognitive stimulation. We are still unable to made a decision re  need for inpatient psychiatric admission until delirium resolves and patient is able to participate in meaningful assessment. Psychiatry will continue to follow.   Larita Fife, MD 10/21/2022 11:31 AM

## 2022-10-21 NOTE — Progress Notes (Signed)
Patient is calm and resting at this time. Patient is arousable, but disoriented per his baseline during this stay.

## 2022-10-21 NOTE — Progress Notes (Addendum)
       CROSS COVER NOTE  NAME: David Kim MRN: [id number] DOB : 03-Sep-1946    Date of Service   10/21/2022   HPI/Events of Note   0000- bedside RN reports patient is becoming restless.  At bedside patient is verbal but disoriented.  He is seen wiggling around in bed.  No agitation seen at this time.  Continue Air cabin crew.   43- bedside RN reports ongoing agitation that has now lasted 10 minutes.  Per RN, patient has been hallucinating that someone is attempting to murder him and is currently "fist fighting the air."  Patient is unable to be reoriented and is unconsolable.  RN also notes tremors and tachycardia (HR 100s).  He is currently receiving scheduled Ativan for benzodiazepine withdrawal.  Ativan last given at 2144.  Additional low dose Ativan ordered.   60- RN reports patient is resting well at this time.   Interventions/ Plan   IV Ativan 0.25 mg x1       Raenette Rover, DNP, [place]

## 2022-10-21 NOTE — Progress Notes (Signed)
Palliative:  HPI: 76 y.o. male  with past medical history of paraplegia, hypertension, PUD, GERD, IBS, OSA, prostate cancer, severe MDD with recent suicidal ideation requiring IVC in the ED admitted on 10/15/2022 with drug overdose and depression escalated after recent death of his wife.    I met again today with David Kim. David Kim is more alert and oriented although with ongoing confusion and likely hallucinations at times. Unable to hold conversation for assessment at this time. Discussed with RN.   I called and spoke with niece, David Kim, and provided update. David Kim is having family zoom call today to discuss goals of care, main contact and surrogate party, and planning on how to continue to support David Kim. David Kim is anxious to know a plan on discharge safety and how to help ensure that David Kim's bills are paid but unfortunately these are not things that we know enough to assist with at this time. I am hopeful that as his mental status improves daily that he can help [place] with his wishes for surrogate decision makes and someone he can trust to help pay his bills. We have discussed that if he is not able to ultimately make these decisions or does not improve to having capacity there may be a role or need for guardianship but not right now.   Update: David Kim followed up to reply that all family believe that David Kim has had poor quality of life since becoming paraplegic and has expressed to many family members that he wished he would just pass away. He has also expressed that he does not want people coming in and out of his home to care for him. Family all agree that they wish to allow David Kim a natural death and do not wish for him to continue to suffer. David Kim also reports that family have all agreed that David Kim's brother and sister in law, David Kim and David Kim, to be the emergency contact and responsible party for David Kim at this time. They agree that David Kim and David Kim live locally and have had the most recent contact with David Kim to know how to help him best  at this time. I have added David Kim and David Kim to emergency contacts as requested. I have not had discussions with family other than David Kim. Plans at this time are dependent on psychiatric evaluation/recommendations. Goals are to continue conservative treatment but no escalation if he were to decline further but to "allow a natural death" per family request.   Exam: Alert, oriented but confused and unable to hold conversation. No distress. Breathing regular, unlabored. Sitter at bedside.   Plan: - DNR - No escalation of care if further decline - Updated emergency contacts according to family wishes at this time - Hopeful to take David Kim's wishes into account with further improvement  60 min  Vinie Sill, NP Palliative Medicine Team Pager [phone] (Please see [email or web address] for schedule) Team Phone [phone]    Greater than 50%  of this time was spent counseling and coordinating care related to the above assessment and plan

## 2022-10-22 ENCOUNTER — Inpatient Hospital Stay (HOSPITAL_COMMUNITY): Payer: Medicare Other

## 2022-10-22 DIAGNOSIS — Z7189 Other specified counseling: Secondary | ICD-10-CM | POA: Diagnosis not present

## 2022-10-22 DIAGNOSIS — T426X2A Poisoning by other antiepileptic and sedative-hypnotic drugs, intentional self-harm, initial encounter: Secondary | ICD-10-CM | POA: Diagnosis not present

## 2022-10-22 DIAGNOSIS — F332 Major depressive disorder, recurrent severe without psychotic features: Secondary | ICD-10-CM | POA: Diagnosis not present

## 2022-10-22 DIAGNOSIS — F333 Major depressive disorder, recurrent, severe with psychotic symptoms: Secondary | ICD-10-CM | POA: Diagnosis not present

## 2022-10-22 DIAGNOSIS — Z515 Encounter for palliative care: Secondary | ICD-10-CM | POA: Diagnosis not present

## 2022-10-22 LAB — COMPREHENSIVE METABOLIC PANEL
ALT: 34 U/L (ref 0–44)
AST: 39 U/L (ref 15–41)
Albumin: 2.5 g/dL — ABNORMAL LOW (ref 3.5–5.0)
Alkaline Phosphatase: 53 U/L (ref 38–126)
Anion gap: 7 (ref 5–15)
BUN: 6 mg/dL — ABNORMAL LOW (ref 8–23)
CO2: 24 mmol/L (ref 22–32)
Calcium: 8 mg/dL — ABNORMAL LOW (ref 8.9–10.3)
Chloride: 107 mmol/L (ref 98–111)
Creatinine, Ser: 0.55 mg/dL — ABNORMAL LOW (ref 0.61–1.24)
GFR, Estimated: >60 mL/min (ref >60)
Glucose, Bld: 107 mg/dL — ABNORMAL HIGH (ref 70–99)
Potassium: 3.6 mmol/L (ref 3.5–5.1)
Sodium: 138 mmol/L (ref 135–145)
Total Bilirubin: 0.6 mg/dL (ref 0.3–1.2)
Total Protein: 5.1 g/dL — ABNORMAL LOW (ref 6.5–8.1)

## 2022-10-22 LAB — CBC WITH DIFFERENTIAL/PLATELET
Abs Immature Granulocytes: 0.21 10*3/uL — ABNORMAL HIGH (ref 0.00–0.07)
Basophils Absolute: 0.1 10*3/uL (ref 0.0–0.1)
Basophils Relative: 1 %
Eosinophils Absolute: 0.5 10*3/uL (ref 0.0–0.5)
Eosinophils Relative: 5 %
HCT: 37.1 % — ABNORMAL LOW (ref 39.0–52.0)
Hemoglobin: 11.6 g/dL — ABNORMAL LOW (ref 13.0–17.0)
Immature Granulocytes: 2 %
Lymphocytes Relative: 9 %
Lymphs Abs: 0.9 10*3/uL (ref 0.7–4.0)
MCH: 29.4 pg (ref 26.0–34.0)
MCHC: 31.3 g/dL (ref 30.0–36.0)
MCV: 93.9 fL (ref 80.0–100.0)
Monocytes Absolute: 0.7 10*3/uL (ref 0.1–1.0)
Monocytes Relative: 6 %
Neutro Abs: 7.9 10*3/uL — ABNORMAL HIGH (ref 1.7–7.7)
Neutrophils Relative %: 77 %
Platelets: 312 10*3/uL (ref 150–400)
RBC: 3.95 MIL/uL — ABNORMAL LOW (ref 4.22–5.81)
RDW: 13.8 % (ref 11.5–15.5)
WBC: 10.3 10*3/uL (ref 4.0–10.5)
nRBC: 0 % (ref 0.0–0.2)

## 2022-10-22 LAB — MAGNESIUM: Magnesium: 1.8 mg/dL (ref 1.7–2.4)

## 2022-10-22 LAB — PHOSPHORUS: Phosphorus: 3.2 mg/dL (ref 2.5–4.6)

## 2022-10-22 MED ORDER — BACLOFEN 10 MG PO TABS: 10.0000 mg | ORAL_TABLET | Freq: Two times a day (BID) | ORAL | Status: DC

## 2022-10-22 MED ORDER — LORAZEPAM 1 MG PO TABS
1.0000 mg | ORAL_TABLET | Freq: Every day | ORAL | Status: DC
  Administered 2022-10-23: 1 mg via ORAL
  Filled 2022-10-22: qty 1

## 2022-10-22 MED ORDER — LORAZEPAM 2 MG/ML IJ SOLN
1.0000 mg | Freq: Once | INTRAMUSCULAR | Status: AC
  Administered 2022-10-22: 1 mg via INTRAVENOUS
  Filled 2022-10-22: qty 1

## 2022-10-22 MED ORDER — AMLODIPINE BESYLATE 5 MG PO TABS
5.0000 mg | ORAL_TABLET | Freq: Every day | ORAL | Status: DC
  Administered 2022-10-23: 5 mg via ORAL
  Filled 2022-10-22: qty 1

## 2022-10-22 NOTE — Consult Note (Signed)
[facility] Face-to-Face Psychiatry Consult   Reason for Consult:  suicidal attempt Referring Physician:  Dr. Cyd Silence Patient Identification: David Kim MRN:  [id number] Principal Diagnosis: Intentional overdose [facility]) Diagnosis:  Principal Problem:   Intentional overdose ([place]) Active Problems:   Severe recurrent major depression ([place])   Paraplegia ([place])   Essential hypertension   GERD   Toxic metabolic encephalopathy   Goals of care, counseling/discussion   Sepsis ([place])   Metabolic acidosis   Bacteremia due to Escherichia coli   Pressure injury of sacral region, stage 2 ([place])   Stercoral colitis   GIB (gastrointestinal bleeding)   Hypokalemia   Stercoral ulcer of rectum   Abnormal finding on GI tract imaging   Acute blood loss anemia   Chronic constipation   Total Time spent with patient: 45 minutes  Subjective:   David Kim is a 76 y.o. male patient admitted after suicide attempt on multiple sedative/hypnotic type medications.   HPI: Patient is alert and limitedly-cooperative. Patient reports " I am okay". Reports he slept well the second half of the night. He is able to answer some orientation questions - he knows he is in the hospital, knows current month and year. He is still unable to provide his understanding of the reason for this hospitalization. He denies feeling suicidal or homicidal at the time of the interview. He continues to demonstrate poor ability to stay on the topic of conversation. Continues to mention different realms of existence. He is delusional that there are people currently at his house who are stealing things from him; when asked why does he thinks so, he sais "I saw them on this TV'".  Patient`s sitter confirmed their observation of intermittent confusion during the day, impaired, attention, concentration and talks about "wish to transfer from this life to another one".        Past Medical History:  Past Medical History:  Diagnosis Date    Allergic rhinitis    ALLERGIC RHINITIS 10/01/2007   Qualifier: Diagnosis of  By: Jenny Reichmann MD, Hunt Oris    Anxiety    Carpal tunnel syndrome    right   Depression    Depression    Erectile dysfunction    Fatigue    GERD (gastroesophageal reflux disease)    Gout    Hypertension    HYPOGONADISM 03/31/2008   Qualifier: Diagnosis of  By: Jenny Reichmann MD, Hunt Oris    IBS (irritable bowel syndrome)    Low back pain    OSA (obstructive sleep apnea)    Paraplegia ([place]) 04/23/2009   Qualifier: Diagnosis of  By: Jenny Reichmann MD, Hunt Oris    Peptic ulcer disease    PEPTIC ULCER DISEASE 10/01/2007   Qualifier: Diagnosis of  By: Jenny Reichmann MD, Hunt Oris    Prostate cancer [facility])    PROSTATE CANCER, HX OF 05/23/2007   Qualifier: Diagnosis of  By: Jenny Reichmann MD, [place] PARALYSIS 04/23/2009   Qualifier: Diagnosis of  By: Jenny Reichmann MD, Hunt Oris    Thoracic spinal cord injury ([place]) 03/12/2012    Past Surgical History:  Procedure Laterality Date   ARTERY AND TENDON REPAIR Left 09/06/2018   Procedure: ARTERY AND TENDON REPAIR;  Surgeon: Charlotte Crumb, MD;  Location: [place];  Service: Orthopedics;  Laterality: Left;   CAST APPLICATION Left 65/07/9356   Procedure: CAST APPLICATION;  Surgeon: Charlotte Crumb, MD;  Location: [place];  Service: Orthopedics;  Laterality: Left;   inguinal heniorrhaphy     left leg  surgery after fibula     NERVE REPAIR Left 09/06/2018   Procedure: NERVE REPAIR TIMES TWO;  Surgeon: Charlotte Crumb, MD;  Location: [place];  Service: Orthopedics;  Laterality: Left;   OPEN REDUCTION INTERNAL FIXATION (ORIF) DISTAL PHALANX Left 09/06/2018   Procedure: OPEN REDUCTION INTERNAL FIXATION (ORIF) LEFT LONG FINGER;  Surgeon: Charlotte Crumb, MD;  Location: [place];  Service: Orthopedics;  Laterality: Left;   PILONIDAL CYST / SINUS EXCISION     PROSTATECTOMY     tonsillectomy     WOUND EXPLORATION Left 09/06/2018   Procedure: EXPLORATION OFCOMPLEX INJURY;  Surgeon: Charlotte Crumb, MD;  Location: [place];  Service:  Orthopedics;  Laterality: Left;   Family History:  Family History  Problem Relation Age of Onset   High blood pressure Mother    Dementia Mother    Diabetes Father     Social History:  Social History   Substance and Sexual Activity  Alcohol Use No     Social History   Substance and Sexual Activity  Drug Use No    Social History   Socioeconomic History   Marital status: Married    Spouse name: [name]   Number of children: 0   Years of education: 12   Highest education level: Not on file  Occupational History   Occupation: retired    Fish farm manager: RETIRED  Tobacco Use   Smoking status: Never   Smokeless tobacco: Never  Vaping Use   Vaping Use: Never used  Substance and Sexual Activity   Alcohol use: No   Drug use: No   Sexual activity: Not on file  Other Topics Concern   Not on file  Social History Narrative   Patient lives at home with [name]). Patient is disabled. Patient has 12 th grade education.   Right handed.   Caffeine- None   Social Determinants of Health   Financial Resource Strain: Low Risk  (07/24/2022)   Overall Financial Resource Strain (CARDIA)    Difficulty of Paying Living Expenses: Not hard at all  Food Insecurity: No Food Insecurity (10/20/2022)   Hunger Vital Sign    Worried About Running Out of Food in the Last Year: Never true    Ran Out of Food in the Last Year: Never true  Transportation Needs: No Transportation Needs (10/20/2022)   PRAPARE - Hydrologist (Medical): No    Lack of Transportation (Non-Medical): No  Physical Activity: Inactive (07/24/2022)   Exercise Vital Sign    Days of Exercise per Week: 0 days    Minutes of Exercise per Session: 0 min  Stress: No Stress Concern Present (07/24/2022)   [place]    Feeling of Stress : Not at all  Social Connections: Moderately Isolated (07/24/2022)   Social Connection and Isolation  Panel [NHANES]    Frequency of Communication with Friends and Family: More than three times a week    Frequency of Social Gatherings with Friends and Family: More than three times a week    Attends Religious Services: Never    Marine scientist or Organizations: No    Attends Archivist Meetings: Never    Marital Status: Married   Additional Social History:    Allergies:   Allergies  Allergen Reactions   Amoxicillin Other (See Comments)    Unknown    Endal Hd Other (See Comments)    Unknown     Labs:  Results for orders placed or performed during the hospital encounter of 10/15/22 (from the past 48 hour(s))  CBC with Differential/Platelet     Status: Abnormal   Collection Time: 10/21/22  5:40 AM  Result Value Ref Range   WBC 13.4 (H) 4.0 - 10.5 K/uL   RBC 3.77 (L) 4.22 - 5.81 MIL/uL   Hemoglobin 11.0 (L) 13.0 - 17.0 g/dL   HCT 35.6 (L) 39.0 - 52.0 %   MCV 94.4 80.0 - 100.0 fL   MCH 29.2 26.0 - 34.0 pg   MCHC 30.9 30.0 - 36.0 g/dL   RDW 14.0 11.5 - 15.5 %   Platelets 280 150 - 400 K/uL   nRBC 0.0 0.0 - 0.2 %   Neutrophils Relative % 83 %   Neutro Abs 11.1 (H) 1.7 - 7.7 K/uL   Lymphocytes Relative 7 %   Lymphs Abs 1.0 0.7 - 4.0 K/uL   Monocytes Relative 6 %   Monocytes Absolute 0.8 0.1 - 1.0 K/uL   Eosinophils Relative 2 %   Eosinophils Absolute 0.3 0.0 - 0.5 K/uL   Basophils Relative 1 %   Basophils Absolute 0.1 0.0 - 0.1 K/uL   Immature Granulocytes 1 %   Abs Immature Granulocytes 0.14 (H) 0.00 - 0.07 K/uL    Comment: Performed at [facility], [place] [address] [place], [place] [address]  Comprehensive metabolic panel     Status: Abnormal   Collection Time: 10/21/22  5:40 AM  Result Value Ref Range   Sodium 140 135 - 145 mmol/L   Potassium 3.7 3.5 - 5.1 mmol/L   Chloride 108 98 - 111 mmol/L   CO2 25 22 - 32 mmol/L   Glucose, Bld 106 (H) 70 - 99 mg/dL    Comment: Glucose reference range applies only to samples taken after fasting  for at least 8 hours.   BUN 6 (L) 8 - 23 mg/dL   Creatinine, Ser 0.55 (L) 0.61 - 1.24 mg/dL   Calcium 7.8 (L) 8.9 - 10.3 mg/dL   Total Protein 5.0 (L) 6.5 - 8.1 g/dL   Albumin 2.6 (L) 3.5 - 5.0 g/dL   AST 36 15 - 41 U/L   ALT 28 0 - 44 U/L   Alkaline Phosphatase 52 38 - 126 U/L   Total Bilirubin 0.8 0.3 - 1.2 mg/dL   GFR, Estimated >60 >60 mL/min    Comment: (NOTE) Calculated using the CKD-EPI Creatinine Equation (2021)    Anion gap 7 5 - 15    Comment: Performed at [facility], [place] [address] [place], [place] [address]  Magnesium     Status: None   Collection Time: 10/21/22  5:40 AM  Result Value Ref Range   Magnesium 1.8 1.7 - 2.4 mg/dL    Comment: Performed at [facility], [place] [address] [place], [place] [address]  Procalcitonin     Status: None   Collection Time: 10/21/22  5:40 AM  Result Value Ref Range   Procalcitonin 1.44 ng/mL    Comment:        Interpretation: PCT > 0.5 ng/mL and <= 2 ng/mL: Systemic infection (sepsis) is possible, but other conditions are known to elevate PCT as well. (NOTE)       Sepsis PCT Algorithm           Lower Respiratory Tract  Infection PCT Algorithm    ----------------------------     ----------------------------         PCT < 0.25 ng/mL                PCT < 0.10 ng/mL          Strongly encourage             Strongly discourage   discontinuation of antibiotics    initiation of antibiotics    ----------------------------     -----------------------------       PCT 0.25 - 0.50 ng/mL            PCT 0.10 - 0.25 ng/mL               OR       >80% decrease in PCT            Discourage initiation of                                            antibiotics      Encourage discontinuation           of antibiotics    ----------------------------     -----------------------------         PCT >= 0.50 ng/mL              PCT 0.26 - 0.50 ng/mL                AND       <80%  decrease in PCT             Encourage initiation of                                             antibiotics       Encourage continuation           of antibiotics    ----------------------------     -----------------------------        PCT >= 0.50 ng/mL                  PCT > 0.50 ng/mL               AND         increase in PCT                  Strongly encourage                                      initiation of antibiotics    Strongly encourage escalation           of antibiotics                                     -----------------------------                                           PCT <= 0.25 ng/mL  OR                                        > 80% decrease in PCT                                      Discontinue / Do not initiate                                             antibiotics  Performed at [place] [address], [place], [place] [address]   CBC with Differential/Platelet     Status: Abnormal   Collection Time: 10/22/22  5:34 AM  Result Value Ref Range   WBC 10.3 4.0 - 10.5 K/uL   RBC 3.95 (L) 4.22 - 5.81 MIL/uL   Hemoglobin 11.6 (L) 13.0 - 17.0 g/dL   HCT 37.1 (L) 39.0 - 52.0 %   MCV 93.9 80.0 - 100.0 fL   MCH 29.4 26.0 - 34.0 pg   MCHC 31.3 30.0 - 36.0 g/dL   RDW 13.8 11.5 - 15.5 %   Platelets 312 150 - 400 K/uL   nRBC 0.0 0.0 - 0.2 %   Neutrophils Relative % 77 %   Neutro Abs 7.9 (H) 1.7 - 7.7 K/uL   Lymphocytes Relative 9 %   Lymphs Abs 0.9 0.7 - 4.0 K/uL   Monocytes Relative 6 %   Monocytes Absolute 0.7 0.1 - 1.0 K/uL   Eosinophils Relative 5 %   Eosinophils Absolute 0.5 0.0 - 0.5 K/uL   Basophils Relative 1 %   Basophils Absolute 0.1 0.0 - 0.1 K/uL   Immature Granulocytes 2 %   Abs Immature Granulocytes 0.21 (H) 0.00 - 0.07 K/uL    Comment: Performed at [facility], [place] [address], [place], [place] [address]  Comprehensive metabolic panel     Status: Abnormal    Collection Time: 10/22/22  5:34 AM  Result Value Ref Range   Sodium 138 135 - 145 mmol/L   Potassium 3.6 3.5 - 5.1 mmol/L   Chloride 107 98 - 111 mmol/L   CO2 24 22 - 32 mmol/L   Glucose, Bld 107 (H) 70 - 99 mg/dL    Comment: Glucose reference range applies only to samples taken after fasting for at least 8 hours.   BUN 6 (L) 8 - 23 mg/dL   Creatinine, Ser 0.55 (L) 0.61 - 1.24 mg/dL   Calcium 8.0 (L) 8.9 - 10.3 mg/dL   Total Protein 5.1 (L) 6.5 - 8.1 g/dL   Albumin 2.5 (L) 3.5 - 5.0 g/dL   AST 39 15 - 41 U/L   ALT 34 0 - 44 U/L   Alkaline Phosphatase 53 38 - 126 U/L   Total Bilirubin 0.6 0.3 - 1.2 mg/dL   GFR, Estimated >60 >60 mL/min    Comment: (NOTE) Calculated using the CKD-EPI Creatinine Equation (2021)    Anion gap 7 5 - 15    Comment: Performed at [facility], [place] [address], [place], [place] [address]  Magnesium     Status: None   Collection Time: 10/22/22  5:34 AM  Result Value Ref Range   Magnesium 1.8  1.7 - 2.4 mg/dL    Comment: Performed at [facility], [place] [address] [place], [place] [address]  Phosphorus     Status: None   Collection Time: 10/22/22  5:34 AM  Result Value Ref Range   Phosphorus 3.2 2.5 - 4.6 mg/dL    Comment: Performed at [facility], [place] [address] [place], [place] [address]    Current Facility-Administered Medications  Medication Dose Route Frequency Provider Last Rate Last Admin   acetaminophen (TYLENOL) tablet 650 mg  650 mg Oral Q6H PRN Dwyane Dee, MD   650 mg at 10/17/22 1694   Or   acetaminophen (TYLENOL) suppository 650 mg  650 mg Rectal Q6H PRN Dwyane Dee, MD   650 mg at 10/17/22 1526   baclofen (LIORESAL) tablet 10 mg  10 mg Oral TID Vernelle Emerald, MD   10 mg at 10/21/22 2149   ceFEPIme (MAXIPIME) 2 g in sodium chloride 0.9 % 100 mL IVPB  2 g Intravenous Q8H Shalhoub, Sherryll Burger, MD   Stopped at 10/22/22 0140   Chlorhexidine Gluconate Cloth 2 % PADS 6 each  6  each Topical Daily Shalhoub, Sherryll Burger, MD   6 each at 10/22/22 0630   docusate sodium (COLACE) capsule 100 mg  100 mg Oral BID Mansouraty, Telford Nab., MD   100 mg at 10/21/22 1012   enoxaparin (LOVENOX) injection 40 mg  40 mg Subcutaneous Q24H Dwyane Dee, MD   40 mg at 10/21/22 1013   hydrALAZINE (APRESOLINE) injection 10 mg  10 mg Intravenous Q6H PRN Shalhoub, Sherryll Burger, MD       lactated ringers 1,000 mL with potassium chloride 20 mEq infusion   Intravenous Continuous Shalhoub, Sherryll Burger, MD 75 mL/hr at 10/22/22 1009 Infusion Verify at 10/22/22 1009   leptospermum manuka honey (MEDIHONEY) paste 1 Application  1 Application Topical Daily Dwyane Dee, MD   1 Application at 50/38/88 1013   LORazepam (ATIVAN) tablet 1 mg  1 mg Oral BID Shalhoub, Sherryll Burger, MD       melatonin tablet 10 mg  10 mg Oral QHS Vernelle Emerald, MD   10 mg at 10/21/22 2149   Oral care mouth rinse  15 mL Mouth Rinse PRN Shalhoub, Sherryll Burger, MD       pantoprazole (PROTONIX) EC tablet 40 mg  40 mg Oral Daily Shalhoub, Sherryll Burger, MD       polyethylene glycol (MIRALAX / GLYCOLAX) packet 17 g  17 g Oral BID Esterwood, Amy S, PA-C   17 g at 10/21/22 1013   polyvinyl alcohol (LIQUIFILM TEARS) 1.4 % ophthalmic solution 1 drop  1 drop Both Eyes PRN Dwyane Dee, MD   1 drop at 10/17/22 2229   senna-docusate (Senokot-S) tablet 1 tablet  1 tablet Oral BID Dwyane Dee, MD   1 tablet at 10/21/22 1012   vancomycin (VANCOREADY) IVPB 750 mg/150 mL  750 mg Intravenous Q12H Vernelle Emerald, MD   Stopped at 10/22/22 [id number]    Musculoskeletal: Strength & Muscle Tone: decreased Gait & Station: unable to stand Patient leans: N/A   Psychiatric Specialty Exam:   Appearance:  CM, ill-appearing, in bed, nasal cannula, wearing hospital clothes, with ok grooming and hygiene. Decreased level of alertness .  Attitude/Behavior: partially-engaging with no eye contact.  Motor: WNL; dyskinesias not evident.   Speech: rumbling, low  volume, occasionally incoherent.  Mood: dysthymic  Affect: blunted, flat.  Thought process: patient appears disorganized, illogical, associations are not appropriate.  Thought content: patient appears delusional, he denies suicidal thoughts, denies homicidal thoughts.  Thought perception: patient possible experiences visual hallucinations.   Cognition: patient is alert and partially-oriented in self, place, month, not in situation; with impaired attention and concentration.  Insight: poor  Judgement: poor   Physical Exam: Physical Exam ROS Blood pressure (!) 175/82, pulse 83, temperature 98.3 F (36.8 C), temperature source Oral, resp. rate 18, height '5\' 8"'$  (1.727 m), weight 82.2 kg, SpO2 96 %. Body mass index is 27.55 kg/m.  Treatment Plan Summary: Daily contact with patient to assess and evaluate symptoms and progress in treatment and Medication management  Assessment and Plan: Patient is a 76 y.o. male patient admitted after suicide attempt on multiple sedative/hypnotic type medications.  Patient presents with waxing and waning confusion in a day, altered sensorium, impaired attention, disorientation and cognitive deficits that appear markedly different than their baseline, suggesting a diagnosis of delirium.   As with all hospitalized patients, would recommend delirium precautions: Minimize use of benzodiazepines, anticholinergics, and opiates, to the extent possible, as these can worsen mentation. Utilize non-pharmacological interventions including frequent reorientation, maintaining day/night distinction (blinds closed during night, open during day), minimizing excessive stimulation, staff continuity, reorient the patient frequently, provide easily visible clock and calendar, provide sensory aids like glasses, hearing aids, encourage ambulation, regular activities and visitors to maintain cognitive stimulation. We are still unable to made a decision re need for inpatient  psychiatric admission until delirium resolves and patient is able to participate in meaningful assessment. Psychiatry will continue to follow.   Larita Fife, MD 10/22/2022 10:15 AM

## 2022-10-22 NOTE — Progress Notes (Signed)
Pharmacy Antibiotic Note  David Kim is a 76 y.o. male admitted on 10/15/2022 with  Ecoli UTI and bacteremia .  Pharmacy has been consulted for Vanco, Cefepime dosing.  ID: Ecoli UTI and bacteremia.  - 12/14: Abx broadened for rising PC and LLL infiltrate on CXR. - Afebrile, WBC 10.3 down, PC 5.73>1.44  12/10 CTX >> 12/14  12/12 Flagyl>:>12/13 12/14 cefepime >>  12/14 vancomycin >>   12/10 Bcx:  1/4 BCx bottles growing E coli (no resistance) - suspect urinary source  12/10 UCx: E. Coli ( pansens)  12/12: MRSA PCR: neg 12/14 repeat BCx: NGTD  Plan: Cefepime 2 gr IV IV q8h  Vancomycin 1500 mg IV x1 then vancomycin 750 mg IV q12h ( SCr rounded up to 1 since paraplegic, Wt 82.2 kg)   - 12/16: Asked about d/c Vanco. Due to critical illness, Dr. Cyd Silence wants to keep Vanco with Cefepime for a total of 5d. Will not do levels.    Height: '5\' 8"'$  (172.7 cm) Weight: 82.2 kg (181 lb 3.5 oz) IBW/kg (Calculated) : 68.4  Temp (24hrs), Avg:98.4 F (36.9 C), Min:97.9 F (36.6 C), Max:98.8 F (37.1 C)  Recent Labs  Lab 10/15/22 1930 10/15/22 2133 10/16/22 0055 10/16/22 0724 10/17/22 0347 10/18/22 0322 10/19/22 0925 10/19/22 1826 10/19/22 2037 10/20/22 0300 10/21/22 0540 10/22/22 0534  WBC 16.3*  --  16.3*  --  11.6* 16.8*  --   --   --   --  13.4* 10.3  CREATININE 1.23  --   --    < > 0.84 0.80 0.74  --   --  0.64 0.55* 0.55*  LATICACIDVEN 1.4 0.9  --   --   --   --   --  1.2 1.7  --   --   --    < > = values in this interval not displayed.    Estimated Creatinine Clearance: 84.7 mL/min (A) (by C-G formula based on SCr of 0.55 mg/dL (L)).    Allergies  Allergen Reactions   Amoxicillin Other (See Comments)    Unknown    Endal Hd Other (See Comments)    Unknown    . [name] S. Alford Highland, PharmD, BCPS Clinical Staff Pharmacist [email or web address] [place] 10/22/2022 8:18 AM

## 2022-10-22 NOTE — Progress Notes (Signed)
PROGRESS NOTE   David Kim  HEN:[id number] DOB: 1945/12/14 DOA: 10/15/2022 PCP: Biagio Borg, MD   Date of Service: the patient was seen and examined on 10/22/2022  Brief Narrative:  David Kim is a 76 yo male with PMH paraplegia, severe MDD, persistent adjustment disorder with mixed anxiety and depressed mood ,PUD, gout, HTN, IBS, OSA, prostate cancer, GERD.  He was recently evaluated under IVC last month after being depressed and expressing suicidal thoughts.  He was evaluated by psychiatry and cleared for discharge home with decrease in his Klonopin dose.  Patient presented to [facility] emergency department after home health aide discovered patient took an unknown amount of pills.    During the patient's emergency department evaluation, discussions with EMS revealed there were opened bottles of losartan, tizanidine, oxybutynin, baclofen, and clonazepam.  Upon initial interview the patient endorsed at least taking clonazepam and tizanidine but would not provide additional information.  Initial evaluation also revealed multiple SIRS criteria with fever of 103.2 and urinalysis suggestive of urinary tract infection prompting initiation of intravenous ceftriaxone.  Patient was initially hospitalized in the stepdown unit for suspected drug overdose with signs concerning for sepsis secondary to urinary tract infection.  Urine culture came back positive for pansensitive E. coli  In the days that followed patient exhibited ongoing delirium tremulousness and hallucinations concerning for benzodiazepine withdrawal prompting initiation of scheduled benzodiazepines in addition to low-dose baclofen.  Psychiatry was also consulted on admission however assessments have been obscured by ongoing encephalopathy felt to be due to both infection and drug overdose.  Due to guarded prognosis palliative care consultation was additionally obtained.   Assessment and Plan: * Intentional overdose  ([place]) Suspected intentional overdose, found to have open bottles of losartan, tizanidine, oxybutynin, baclofen and clonazepam by EMS.  Quantity is unclear.   Originally, all of patient's home medications were initially held due to the overdose  Now patient has been placed back on scheduled doses of benzodiazepine and baclofen to avoid withdrawal. Currently tapering medications slowly Of note, patient underwent behavioral health hospitalization 09/2022 for suicidal ideation and attempt Continue sitter one-to-one safety precautions Will place IVC paperwork if patient attempts to elope. Psychiatry following and is awaiting delirium to resolve. Monitoring closely with serial labs and ongoing telemetry and pulse oximetry monitoring  Sepsis ([place]) Sepsis thought to be secondary to E. coli urinary tract infection on admission.  Several days later  left lower lobe infiltrate noted on chest x-ray 12/14 as an additional source. Antibiotic regimen was expanded from ceftriaxone monotherapy to cefepime and vancomycin on 12/14.  This will be continued for total of 5 days with antibiotics being completed on 12/18. SIRS have resolved Following repeat blood cultures Hydrating patient with intravenous isotonic fluids   Acute hypoxemic respiratory failure (HCC)-resolved as of 10/16/2022 - suspect in setting of OD - no overt signs of aspiration of loss of consciousness - has been weaned back to RA  Toxic metabolic encephalopathy Multifactorial secondary to underlying infection and initially superimposed drug overdose  Now, there is concern that patient may be withdrawing from these same drugs, particularly benzodiazepine and baclofen withdrawal.  Seemingly slow clinical improvement Will obtain MRI brain to evaluate for any evidence of anoxic brain injury or acute stroke after drug overdose that may be the cause of the patient's presentation. Now slowly tapering Ativan and baclofen dosing Recent TSH, vitamin  B12 unremarkable. EEG obtained on 12/15 without obvious seizure activity although this was substantially obscured by ongoing benzodiazepine use.  Supportive care monitor for continued slow improvement.  GIB (gastrointestinal bleeding) Episode of bright red blood per rectum on 12/12 followed by additional small episode morning of 12/13  Patient's status post 2 unit packed blood cell transfusion on 12/12.   Consulted, they felt  that susspected etiology is due to stercoral colitis with ulceration Monitoring hemoglobin and hematocrit with serial CBCs CT angiogram of the abdomen performed 12/12 revealed no evidence of active bleed. No further episodes of bleeding since.   Bacteremia due to Escherichia coli Currently on intravenous antibiotics as noted above until 12/18.  Stercoral colitis Severe history of constipation.   In the past, patient's wife was primarily involved in performing regular rectal stimulation and disimpactions prior to her death Aggressive laxative regimen ordered with MiraLAX senna and Colace with patient now moving his bowels regularly CT imaging suggestive of stercoral colitis  Given his underlying paraplegia, suspect he has neurogenic bowel  Metabolic acidosis Etiology suspected due to overdose Resolved  Severe recurrent major depression [facility]) Psychiatry following although patient is suffering from encephalopathy at this point According to the patient's niece, David Kim on 12/12 patient has become severely depressed since his wife passed away approximately 6 weeks ago.  She was essentially the patient's sole caretaker and he was completely dependent on her.  Since her passing, he has began "shutting down"  Pressure injury of sacral region, stage 2 ([place]) Present on admission Daily dressing changes Frequent turns  Essential hypertension Placing patient on modest regimen of amlodipine 5 mg by mouth once daily PRN intravenous antihypertensives for excessively  elevated blood pressure   Paraplegia (HCC) Frequent turns to minimize development of pressure ulcerations Fall cautions  GERD Continue oral Protonix  Hypokalemia Replaced   Goals of care, counseling/discussion Goals of care discussion 12/12 with David Kim patient's niece along with multiple other family members. Family informed of patient's guarded prognosis Of care is additionally discussed goals of care with niece and family today on 12/14.  They do not wish to escalate care further at this time. Patient is currently DNR Palliative care has been consulted and is following along, their assistance is appreciated.  Elevated liver enzymes - possibly in setting of sepsis vs OD - continue IVF and trend LFTs    Subjective:  Patient more verbal today.  Patient denies complaints.  Physical Exam:  Vitals:   10/22/22 1900 10/22/22 2000 10/22/22 2041 10/22/22 2207  BP:    (!) 150/79  Pulse:    86  Resp: 18 (!) 24 19 (!) 21  Temp:  98.6 F (37 C)    TempSrc:  Oral    SpO2:    95%  Weight:      Height:        Constitutional: Pleasantly confused, patient now oriented x 3.  Less agitated than days prior. Skin: Stage II sacral wound present on admission, unchanged.  Poor skin turgor. Eyes: Pupils are equally reactive to light.  No evidence of scleral icterus or conjunctival pallor.  ENMT: Somewhat dry mucous membranes noted.  Posterior pharynx clear of any exudate or lesions.   Respiratory: Notable bibasilar rales with no evidence of significant wheezing.  Normal respiratory effort. No accessory muscle use.  Cardiovascular: Regular rate and rhythm, no murmurs / rubs / gallops. No extremity edema. 2+ pedal pulses. No carotid bruits.  Abdomen: Abdomen is soft and nontender.  No evidence of intra-abdominal masses.  Positive bowel sounds noted in all quadrants.   Musculoskeletal: No joint deformity upper and lower  extremities. Good ROM, no contractures. Normal muscle tone.   Neuro: Patient now seemingly following commands consistently.  Participating in conversation.  Patient exhibiting good strength in the bilateral upper extremities.  Patient has known paraplegia. Psych: Increasing ability to participate in conversation.  Patient is still exhibiting waxing and waning confusion obscuring psychiatric evaluation as well as ongoing visual hallucinations.  Patient still does not seem to possess insight as to his current situation.   Data Reviewed:  I have personally reviewed and interpreted labs, imaging.  Significant findings are   CBC: Recent Labs  Lab 10/16/22 0055 10/17/22 0347 10/17/22 0719 10/18/22 0322 10/18/22 1225 10/18/22 1706 10/21/22 0540 10/22/22 0534  WBC 16.3* 11.6*  --  16.8*  --   --  13.4* 10.3  NEUTROABS  --  9.2*  --  13.9*  --   --  11.1* 7.9*  HGB 15.5 13.6   < > 13.3 13.2 14.5 11.0* 11.6*  HCT 49.0 42.5   < > 41.5 40.2 45.1 35.6* 37.1*  MCV 91.8 90.4  --  91.0  --   --  94.4 93.9  PLT 239 275  --  207  --   --  280 312   < > = values in this interval not displayed.   Basic Metabolic Panel: Recent Labs  Lab 10/18/22 0322 10/19/22 0925 10/20/22 0300 10/21/22 0540 10/22/22 0534  NA 140 136 142 140 138  K 3.2* 3.1* 3.4* 3.7 3.6  CL 108 107 109 108 107  CO2 23 17* '23 25 24  '$ GLUCOSE 122* 136* 107* 106* 107*  BUN '18 13 8 '$ 6* 6*  CREATININE 0.80 0.74 0.64 0.55* 0.55*  CALCIUM 7.8* 7.7* 7.8* 7.8* 8.0*  MG 2.0 1.7 1.8 1.8 1.8  PHOS  --   --   --   --  3.2   GFR: Estimated Creatinine Clearance: 84.7 mL/min (A) (by C-G formula based on SCr of 0.55 mg/dL (L)). Liver Function Tests: Recent Labs  Lab 10/16/22 0724 10/19/22 0925 10/20/22 0300 10/21/22 0540 10/22/22 0534  AST 64* 68* 53* 36 39  ALT '28 31 30 28 '$ 34  ALKPHOS 58 51 51 52 53  BILITOT 1.2 1.0 1.0 0.8 0.6  PROT 5.9* 5.1* 5.2* 5.0* 5.1*  ALBUMIN 2.8* 2.4* 2.6* 2.6* 2.5*    Coagulation Profile: No results for input(s): "INR", "PROTIME" in the last 168  hours.   EKG/Telemetry: Personally reviewed.  Rhythm is normal sinus rhythm with heart rate of  84 bpm.  No dynamic ST segment changes appreciated.   Code Status:  DNR.  Code status decision has been confirmed with: niece    Severity of Illness:  The appropriate patient status for this patient is INPATIENT. Inpatient status is judged to be reasonable and necessary in order to provide the required intensity of service to ensure the patient's safety. The patient's presenting symptoms, physical exam findings, and initial radiographic and laboratory data in the context of their chronic comorbidities is felt to place them at high risk for further clinical deterioration. Furthermore, it is not anticipated that the patient will be medically stable for discharge from the hospital within 2 midnights of admission.   * I certify that at the point of admission it is my clinical judgment that the patient will require inpatient hospital care spanning beyond 2 midnights from the point of admission due to high intensity of service, high risk for further deterioration and high frequency of surveillance required.*  Time spent:  61 minutes  Author:  Vernelle Emerald MD  10/22/2022 11:15 PM

## 2022-10-23 DIAGNOSIS — A4151 Sepsis due to Escherichia coli [E. coli]: Secondary | ICD-10-CM

## 2022-10-23 DIAGNOSIS — A419 Sepsis, unspecified organism: Secondary | ICD-10-CM | POA: Diagnosis not present

## 2022-10-23 DIAGNOSIS — T50902A Poisoning by unspecified drugs, medicaments and biological substances, intentional self-harm, initial encounter: Secondary | ICD-10-CM | POA: Diagnosis not present

## 2022-10-23 DIAGNOSIS — R6520 Severe sepsis without septic shock: Secondary | ICD-10-CM

## 2022-10-23 DIAGNOSIS — G9340 Encephalopathy, unspecified: Secondary | ICD-10-CM | POA: Diagnosis not present

## 2022-10-23 DIAGNOSIS — G9341 Metabolic encephalopathy: Secondary | ICD-10-CM

## 2022-10-23 DIAGNOSIS — G928 Other toxic encephalopathy: Secondary | ICD-10-CM | POA: Diagnosis not present

## 2022-10-23 LAB — COMPREHENSIVE METABOLIC PANEL
ALT: 35 U/L (ref 0–44)
AST: 37 U/L (ref 15–41)
Albumin: 2.7 g/dL — ABNORMAL LOW (ref 3.5–5.0)
Alkaline Phosphatase: 57 U/L (ref 38–126)
Anion gap: 6 (ref 5–15)
BUN: 9 mg/dL (ref 8–23)
CO2: 24 mmol/L (ref 22–32)
Calcium: 8.4 mg/dL — ABNORMAL LOW (ref 8.9–10.3)
Chloride: 111 mmol/L (ref 98–111)
Creatinine, Ser: 0.6 mg/dL — ABNORMAL LOW (ref 0.61–1.24)
GFR, Estimated: >60 mL/min (ref >60)
Glucose, Bld: 108 mg/dL — ABNORMAL HIGH (ref 70–99)
Potassium: 3.7 mmol/L (ref 3.5–5.1)
Sodium: 141 mmol/L (ref 135–145)
Total Bilirubin: 0.8 mg/dL (ref 0.3–1.2)
Total Protein: 5.3 g/dL — ABNORMAL LOW (ref 6.5–8.1)

## 2022-10-23 LAB — CBC WITH DIFFERENTIAL/PLATELET
Abs Immature Granulocytes: 0.29 10*3/uL — ABNORMAL HIGH (ref 0.00–0.07)
Basophils Absolute: 0.1 10*3/uL (ref 0.0–0.1)
Basophils Relative: 1 %
Eosinophils Absolute: 0.4 10*3/uL (ref 0.0–0.5)
Eosinophils Relative: 4 %
HCT: 37.1 % — ABNORMAL LOW (ref 39.0–52.0)
Hemoglobin: 11.9 g/dL — ABNORMAL LOW (ref 13.0–17.0)
Immature Granulocytes: 3 %
Lymphocytes Relative: 10 %
Lymphs Abs: 1 10*3/uL (ref 0.7–4.0)
MCH: 29.5 pg (ref 26.0–34.0)
MCHC: 32.1 g/dL (ref 30.0–36.0)
MCV: 92.1 fL (ref 80.0–100.0)
Monocytes Absolute: 0.7 10*3/uL (ref 0.1–1.0)
Monocytes Relative: 7 %
Neutro Abs: 7.6 10*3/uL (ref 1.7–7.7)
Neutrophils Relative %: 75 %
Platelets: 377 10*3/uL (ref 150–400)
RBC: 4.03 MIL/uL — ABNORMAL LOW (ref 4.22–5.81)
RDW: 14.1 % (ref 11.5–15.5)
WBC: 10.1 10*3/uL (ref 4.0–10.5)
nRBC: 0 % (ref 0.0–0.2)

## 2022-10-23 LAB — MAGNESIUM: Magnesium: 2 mg/dL (ref 1.7–2.4)

## 2022-10-23 MED ORDER — BACLOFEN 10 MG PO TABS
5.0000 mg | ORAL_TABLET | Freq: Two times a day (BID) | ORAL | Status: DC
  Administered 2022-10-23 – 2022-10-24 (×3): 5 mg via ORAL
  Filled 2022-10-23 (×3): qty 1

## 2022-10-23 MED ORDER — TIZANIDINE HCL 4 MG PO TABS
4.0000 mg | ORAL_TABLET | Freq: Four times a day (QID) | ORAL | Status: DC | PRN
  Administered 2022-10-23 – 2022-11-15 (×5): 4 mg via ORAL
  Filled 2022-10-23 (×5): qty 1

## 2022-10-23 MED ORDER — LORAZEPAM 1 MG PO TABS: 1.0000 mg | ORAL_TABLET | Freq: Every day | ORAL | Status: DC

## 2022-10-23 MED ORDER — AMLODIPINE BESYLATE 10 MG PO TABS
10.0000 mg | ORAL_TABLET | Freq: Every day | ORAL | Status: DC
  Administered 2022-10-24 – 2022-11-17 (×25): 10 mg via ORAL
  Filled 2022-10-23 (×25): qty 1

## 2022-10-23 NOTE — Progress Notes (Signed)
Palliative:  Chart reviewed. Family have made decision for main contact (updated in chart), DNR, continue conservative treatment, no escalation to aggressive/invasive measures to prolong life with further decline. Awaiting delirium to clear so he can have full psychiatric evaluation and recommendations can be determined. Family have significant concerns on how to keep him safe in the future. He has limited support in the home (his wife was his primary caregiver). Continue medical support - time for outcomes.   No charge  Vinie Sill, NP Palliative Medicine Team Pager [phone] (Please see [email or web address] for schedule) Team Phone [phone]

## 2022-10-23 NOTE — Consult Note (Signed)
Brief Psychiatry Consult Note  Saw pt again. He was more alert than my last assessment, and oriented to self. He is aware that he is in the hospital for a suicide attempt. He did fairly well on attention testing - missed 2/7 days on DOWB. Has a brief moment of clarity where he discusses his efforts to put his wife's affairs in order before his planned suicide attempt. Shortly after this he began talking about the upcoming rapture (recurring theme of ED stay in November), his mistrust of his home health care company (probably the "people stealing from me" in Dr. Murlean Hark last note, and that he thought he might have gotten remarried because someone told him. Again brought up different realms of existence.   - ongoing balance between sedative-hypnotic withdrawal and avoiding oversedation/worsening delirium - does seem to be improving a little each day on current taper  - now on baclofen 5 BID, ativan 1 TID (consider dec ativan tomorrow)  - cefepime can also contribute to delirium (as can all antibiotics)- on day 4/5 per review so not worth switching.  - delirium precautions - cannot make decision for inpt psych admission however heavily bias to inpt - continue sitter  Joycelyn Schmid A Estie Sproule

## 2022-10-23 NOTE — Progress Notes (Signed)
PROGRESS NOTE   ABDOULAYE DRUM  LFY:[id number] DOB: 07-Aug-1946 DOA: 10/15/2022 PCP: Biagio Borg, MD   Date of Service: the patient was seen and examined on 10/23/2022  Brief Narrative:  Mr. Jury is a 76 yo male with PMH paraplegia, severe MDD, persistent adjustment disorder with mixed anxiety and depressed mood ,PUD, gout, HTN, IBS, OSA, prostate cancer, GERD.  He was recently evaluated under IVC last month after being depressed and expressing suicidal thoughts.  He was evaluated by psychiatry and cleared for discharge home with decrease in his Klonopin dose.  Patient presented to [facility] emergency department after home health aide discovered patient took an unknown amount of pills.    During the patient's emergency department evaluation, discussions with EMS revealed there were opened bottles of losartan, tizanidine, oxybutynin, baclofen, and clonazepam.  Upon initial interview the patient endorsed at least taking clonazepam and tizanidine but would not provide additional information.  Initial evaluation also revealed multiple SIRS criteria with fever of 103.2 and urinalysis suggestive of urinary tract infection prompting initiation of intravenous ceftriaxone.  Patient was initially hospitalized in the stepdown unit for suspected drug overdose with signs concerning for sepsis secondary to urinary tract infection.  Urine culture came back positive for pansensitive E. coli  In the days that followed patient exhibited ongoing delirium tremulousness and hallucinations concerning for benzodiazepine withdrawal prompting initiation of scheduled benzodiazepines in addition to low-dose baclofen.  Psychiatry was also consulted on admission however assessments have been obscured by ongoing encephalopathy felt to be due to both infection and drug overdose.  Due to guarded prognosis palliative care consultation was additionally obtained.   Assessment and Plan: * Intentional overdose  ([place]) Suspected intentional overdose, found to have open bottles of losartan, tizanidine, oxybutynin, baclofen and clonazepam by EMS.  Quantity is unclear.   Originally, all of patient's home medications were initially held due to the overdose  Patient was then resumed on benzodiazepines and baclofen due to concerns for withdrawal and have been slowly tapered since Of note, patient underwent behavioral health hospitalization 09/2022 for suicidal ideation and attempt Continue sitter one-to-one safety precautions Will place IVC paperwork if patient attempts to elope. Psychiatry following and is awaiting delirium to resolve. Monitoring closely with serial labs and ongoing telemetry and pulse oximetry monitoring  Sepsis ([place]) Sepsis thought to be secondary to E. coli urinary tract infection on admission.  Then, several days later  left lower lobe infiltrate noted on chest x-ray 12/14 as an additional source. Antibiotic regimen was expanded from ceftriaxone monotherapy to cefepime and vancomycin on 12/14.  This will be continued for total of 5 days with antibiotics (12/19). SIRS have resolved Following repeat blood cultures discontinuing intravenous fluids today.  Acute hypoxemic respiratory failure (HCC)-resolved as of 10/16/2022 - suspect in setting of OD - no overt signs of aspiration of loss of consciousness - has been weaned back to RA  Toxic metabolic encephalopathy Multifactorial secondary to underlying infection and superimposed drug overdose followed by concern that patient may be withdrawing from these same drugs, particularly benzodiazepine and baclofen withdrawal.  Seemingly slow clinical improvement MRI brain unremarkable Recent TSH, vitamin B12 unremarkable. EEG obtained on 12/15 without obvious seizure activity although this was substantially obscured by ongoing benzodiazepine use. Supportive care monitor for continued slow improvement. Continued slow weaning of benzodiazepines  and baclofen  GIB (gastrointestinal bleeding) Episode of bright red blood per rectum on 12/12 followed by additional small episode morning of 12/13  Patient's status post 2 unit  packed blood cell transfusion on 12/12.   Consulted, they felt  that susspected etiology is due to stercoral colitis with ulceration Monitoring hemoglobin and hematocrit with serial CBCs CT angiogram of the abdomen performed 12/12 revealed no evidence of active bleed. No further episodes of bleeding since.   Bacteremia due to Escherichia coli Currently on intravenous antibiotics as noted above until 12/19.  Stercoral colitis Severe history of constipation.   In the past, patient's wife was primarily involved in performing regular rectal stimulation and disimpactions prior to her death Aggressive laxative regimen ordered with MiraLAX senna and Colace with patient now moving his bowels regularly CT imaging suggestive of stercoral colitis  Given his underlying paraplegia, suspect he has neurogenic bowel  Metabolic acidosis Etiology suspected due to overdose Resolved  Severe recurrent major depression [facility]) Psychiatry following although patient is still suffering from some element of encephalopathy According to the patient's niece, [name] on 12/12 patient has become severely depressed since his wife passed away approximately 6 weeks ago.  She was essentially the patient's sole caretaker and he was completely dependent on her.  Since her passing, he has began "shutting down"  Pressure injury of sacral region, stage 2 ([place]) Present on admission Daily dressing changes Frequent turns  Essential hypertension Titrating amlodipine to 10 mg once daily PRN intravenous antihypertensives for excessively elevated blood pressure   Paraplegia (HCC) Frequent turns to minimize development of pressure ulcerations Fall cautions  GERD Continue oral Protonix  Hypokalemia Replaced   Goals of care,  counseling/discussion Goals of care discussion 12/12 with [name] patient's niece along with multiple other family members. Family informed of patient's guarded prognosis Of care is additionally discussed goals of care with niece and family today on 12/14.  They do not wish to escalate care further at this time. Patient is currently DNR Palliative care has been consulted and is following along, their assistance is appreciated.  Elevated liver enzymes - possibly in setting of sepsis vs OD - continue IVF and trend LFTs    Subjective:  Patient continues to become more active and participating in conversation.  Patient has no complaints.  Physical Exam:  Vitals:   10/23/22 0800 10/23/22 1200 10/23/22 1453 10/23/22 2100  BP:   (!) 149/68 135/71  Pulse:   95 78  Resp:   18 20  Temp: 98.5 F (36.9 C) 98.7 F (37.1 C) 99 F (37.2 C) 98.9 F (37.2 C)  TempSrc: Axillary Axillary Oral Oral  SpO2:   91% 93%  Weight:      Height:        Constitutional: Confusion nearly resolved, patient now oriented x 3.  Patient is not in any acute distress. Skin: Stage II sacral wound present on admission, unchanged.  Poor skin turgor. Eyes: Pupils are equally reactive to light.  No evidence of scleral icterus or conjunctival pallor.  ENMT: Somewhat dry mucous membranes noted.  Posterior pharynx clear of any exudate or lesions.   Respiratory: No evidence of rales, no evidence of significant wheezing.  Normal respiratory effort. No accessory muscle use.  Cardiovascular: Regular rate and rhythm, no murmurs / rubs / gallops. No extremity edema. 2+ pedal pulses. No carotid bruits.  Abdomen: Abdomen is soft and nontender.  No evidence of intra-abdominal masses.  Positive bowel sounds noted in all quadrants.   Musculoskeletal: Poor muscle tone with bilateral lower extremities.  No evidence of contractures.   Neuro: Patient exhibiting intermittent jerking of the muscle groups of the legs.  Patient  following, as consistently.  Patient exhibiting good strength in the bilateral upper extremities.  Patient has known paraplegia. Psych: Increasing ability to participate in conversation.  Minimal hallucinations noted during my interview with the patient today.  Patient still does not seem to possess insight as to his current situation.   Data Reviewed:  I have personally reviewed and interpreted labs, imaging.  Significant findings are   CBC: Recent Labs  Lab 10/17/22 0347 10/17/22 0719 10/18/22 0322 10/18/22 1225 10/18/22 1706 10/21/22 0540 10/22/22 0534 10/23/22 0804  WBC 11.6*  --  16.8*  --   --  13.4* 10.3 10.1  NEUTROABS 9.2*  --  13.9*  --   --  11.1* 7.9* 7.6  HGB 13.6   < > 13.3 13.2 14.5 11.0* 11.6* 11.9*  HCT 42.5   < > 41.5 40.2 45.1 35.6* 37.1* 37.1*  MCV 90.4  --  91.0  --   --  94.4 93.9 92.1  PLT 275  --  207  --   --  280 312 377   < > = values in this interval not displayed.   Basic Metabolic Panel: Recent Labs  Lab 10/19/22 0925 10/20/22 0300 10/21/22 0540 10/22/22 0534 10/23/22 0804  NA 136 142 140 138 141  K 3.1* 3.4* 3.7 3.6 3.7  CL 107 109 108 107 111  CO2 17* '23 25 24 24  '$ GLUCOSE 136* 107* 106* 107* 108*  BUN 13 8 6* 6* 9  CREATININE 0.74 0.64 0.55* 0.55* 0.60*  CALCIUM 7.7* 7.8* 7.8* 8.0* 8.4*  MG 1.7 1.8 1.8 1.8 2.0  PHOS  --   --   --  3.2  --    GFR: Estimated Creatinine Clearance: 86.3 mL/min (A) (by C-G formula based on SCr of 0.6 mg/dL (L)). Liver Function Tests: Recent Labs  Lab 10/19/22 0925 10/20/22 0300 10/21/22 0540 10/22/22 0534 10/23/22 0804  AST 68* 53* 36 39 37  ALT '31 30 28 '$ 34 35  ALKPHOS 51 51 52 53 57  BILITOT 1.0 1.0 0.8 0.6 0.8  PROT 5.1* 5.2* 5.0* 5.1* 5.3*  ALBUMIN 2.4* 2.6* 2.6* 2.5* 2.7*    Coagulation Profile: No results for input(s): "INR", "PROTIME" in the last 168 hours.   EKG/Telemetry: Personally reviewed.  Rhythm is normal sinus rhythm with heart rate of  84 bpm.  No dynamic ST segment changes  appreciated.   Code Status:  DNR.  Code status decision has been confirmed with: niece    Severity of Illness:  The appropriate patient status for this patient is INPATIENT. Inpatient status is judged to be reasonable and necessary in order to provide the required intensity of service to ensure the patient's safety. The patient's presenting symptoms, physical exam findings, and initial radiographic and laboratory data in the context of their chronic comorbidities is felt to place them at high risk for further clinical deterioration. Furthermore, it is not anticipated that the patient will be medically stable for discharge from the hospital within 2 midnights of admission.   * I certify that at the point of admission it is my clinical judgment that the patient will require inpatient hospital care spanning beyond 2 midnights from the point of admission due to high intensity of service, high risk for further deterioration and high frequency of surveillance required.*  Time spent:  55 minutes  Author:  Vernelle Emerald MD  10/23/2022 9:13 PM

## 2022-10-24 DIAGNOSIS — Z515 Encounter for palliative care: Secondary | ICD-10-CM | POA: Diagnosis not present

## 2022-10-24 DIAGNOSIS — E872 Acidosis, unspecified: Secondary | ICD-10-CM | POA: Diagnosis not present

## 2022-10-24 DIAGNOSIS — F333 Major depressive disorder, recurrent, severe with psychotic symptoms: Secondary | ICD-10-CM | POA: Diagnosis not present

## 2022-10-24 DIAGNOSIS — T50902A Poisoning by unspecified drugs, medicaments and biological substances, intentional self-harm, initial encounter: Secondary | ICD-10-CM | POA: Diagnosis not present

## 2022-10-24 DIAGNOSIS — G928 Other toxic encephalopathy: Secondary | ICD-10-CM | POA: Diagnosis not present

## 2022-10-24 DIAGNOSIS — G9340 Encephalopathy, unspecified: Secondary | ICD-10-CM | POA: Diagnosis not present

## 2022-10-24 DIAGNOSIS — Z7189 Other specified counseling: Secondary | ICD-10-CM | POA: Diagnosis not present

## 2022-10-24 DIAGNOSIS — A4151 Sepsis due to Escherichia coli [E. coli]: Secondary | ICD-10-CM | POA: Diagnosis not present

## 2022-10-24 LAB — COMPREHENSIVE METABOLIC PANEL
ALT: 35 U/L (ref 0–44)
AST: 35 U/L (ref 15–41)
Albumin: 2.5 g/dL — ABNORMAL LOW (ref 3.5–5.0)
Alkaline Phosphatase: 55 U/L (ref 38–126)
Anion gap: 7 (ref 5–15)
BUN: 9 mg/dL (ref 8–23)
CO2: 23 mmol/L (ref 22–32)
Calcium: 8.1 mg/dL — ABNORMAL LOW (ref 8.9–10.3)
Chloride: 105 mmol/L (ref 98–111)
Creatinine, Ser: 0.6 mg/dL — ABNORMAL LOW (ref 0.61–1.24)
GFR, Estimated: >60 mL/min (ref >60)
Glucose, Bld: 128 mg/dL — ABNORMAL HIGH (ref 70–99)
Potassium: 3.5 mmol/L (ref 3.5–5.1)
Sodium: 135 mmol/L (ref 135–145)
Total Bilirubin: 0.9 mg/dL (ref 0.3–1.2)
Total Protein: 5.5 g/dL — ABNORMAL LOW (ref 6.5–8.1)

## 2022-10-24 LAB — CBC WITH DIFFERENTIAL/PLATELET
Abs Immature Granulocytes: 0.3 10*3/uL — ABNORMAL HIGH (ref 0.00–0.07)
Basophils Absolute: 0.1 10*3/uL (ref 0.0–0.1)
Basophils Relative: 1 %
Eosinophils Absolute: 0.4 10*3/uL (ref 0.0–0.5)
Eosinophils Relative: 4 %
HCT: 39 % (ref 39.0–52.0)
Hemoglobin: 12.5 g/dL — ABNORMAL LOW (ref 13.0–17.0)
Immature Granulocytes: 3 %
Lymphocytes Relative: 8 %
Lymphs Abs: 0.9 10*3/uL (ref 0.7–4.0)
MCH: 29.6 pg (ref 26.0–34.0)
MCHC: 32.1 g/dL (ref 30.0–36.0)
MCV: 92.2 fL (ref 80.0–100.0)
Monocytes Absolute: 0.8 10*3/uL (ref 0.1–1.0)
Monocytes Relative: 8 %
Neutro Abs: 7.9 10*3/uL — ABNORMAL HIGH (ref 1.7–7.7)
Neutrophils Relative %: 76 %
Platelets: 402 10*3/uL — ABNORMAL HIGH (ref 150–400)
RBC: 4.23 MIL/uL (ref 4.22–5.81)
RDW: 14.1 % (ref 11.5–15.5)
WBC: 10.4 10*3/uL (ref 4.0–10.5)
nRBC: 0 % (ref 0.0–0.2)

## 2022-10-24 LAB — MAGNESIUM: Magnesium: 1.7 mg/dL (ref 1.7–2.4)

## 2022-10-24 LAB — CULTURE, BLOOD (ROUTINE X 2)
Culture: NO GROWTH
Culture: NO GROWTH
Special Requests: ADEQUATE

## 2022-10-24 LAB — PROCALCITONIN: Procalcitonin: 0.14 ng/mL

## 2022-10-24 MED ORDER — BACLOFEN 10 MG PO TABS
10.0000 mg | ORAL_TABLET | Freq: Two times a day (BID) | ORAL | Status: DC
  Administered 2022-10-24 – 2022-11-17 (×48): 10 mg via ORAL
  Filled 2022-10-24 (×48): qty 1

## 2022-10-24 MED ORDER — VALPROATE SODIUM 100 MG/ML IV SOLN
250.0000 mg | Freq: Once | INTRAVENOUS | Status: AC
  Administered 2022-10-24: 250 mg via INTRAVENOUS
  Filled 2022-10-24: qty 2.5

## 2022-10-24 MED ORDER — SODIUM CHLORIDE 0.9 % IV SOLN: INTRAVENOUS | Status: DC | PRN

## 2022-10-24 MED ORDER — CLONAZEPAM 0.5 MG PO TABS
0.5000 mg | ORAL_TABLET | Freq: Two times a day (BID) | ORAL | Status: DC
  Administered 2022-10-24 – 2022-11-17 (×49): 0.5 mg via ORAL
  Filled 2022-10-24 (×49): qty 1

## 2022-10-24 MED ORDER — HALOPERIDOL LACTATE 5 MG/ML IJ SOLN: 1.0000 mg | Freq: Once | INTRAMUSCULAR | Status: DC

## 2022-10-24 NOTE — Procedures (Incomplete)
Patient Name: David Kim  MRN: [id number]  Epilepsy Attending: Lora Havens  Referring Physician/Provider: Vernelle Emerald, MD  Date: 10/25/2022 Duration: 24.36 mins  Patient history: 76 year old male with altered mental status.  EEG to evaluate for seizure.  Level of alertness: Awake, asleep  AEDs during EEG study: Clonazepam  Technical aspects: This EEG study was done with scalp electrodes positioned according to the 10-20 International system of electrode placement. Electrical activity was reviewed with band pass filter of 1-'70Hz'$ , sensitivity of 7 uV/mm, display speed of 80m/sec with a '60Hz'$  notched filter applied as appropriate. EEG data were recorded continuously and digitally stored.  Video monitoring was available and reviewed as appropriate.  Description: The posterior dominant rhythm consists of 9-10 Hz activity of moderate voltage (25-35 uV) seen predominantly in posterior head regions, symmetric and reactive to eye opening and eye closing. Sleep was characterized by vertex waves, sleep spindles (12 to 14 Hz), maximal frontocentral region. There is also 15 to 18 Hz beta activity distributed symmetrically and diffusely.  Hyperventilation and photic stimulation were not performed.     ABNORMALITY - Excessive beta, generalized  IMPRESSION: This study is within normal limits. The excessive beta activity seen in the background is most likely due to the effect of benzodiazepine and is a benign EEG pattern. No seizures or epileptiform discharges were seen throughout the recording.  David Kim OBarbra Sarks

## 2022-10-24 NOTE — Progress Notes (Signed)
PROGRESS NOTE   MICHAL CALLICOTT  EHU:[id number] DOB: 1946-05-29 DOA: 10/15/2022 PCP: Biagio Borg, MD   Date of Service: the patient was seen and examined on 10/24/2022  Brief Narrative:  Mr. Buffalo is a 76 yo male with PMH paraplegia, severe MDD, persistent adjustment disorder with mixed anxiety and depressed mood ,PUD, gout, HTN, IBS, OSA, prostate cancer, GERD.  He was recently evaluated under IVC last month after being depressed and expressing suicidal ideation.  He was treated by psychiatry on their service and cleared for discharge home with decrease in his Klonopin dose on 11/13.  Patient presented to [facility] emergency department after home health aide discovered patient took an unknown amount of pills.    During the patient's emergency department evaluation, discussions with EMS revealed there were opened bottles of losartan, tizanidine, oxybutynin, baclofen, and clonazepam.  Upon initial interview the patient endorsed at least taking clonazepam and tizanidine but would not provide additional information.  Initial evaluation also revealed multiple SIRS criteria with fever of 103.2 and urinalysis suggestive of urinary tract infection prompting initiation of intravenous ceftriaxone.  Patient was initially hospitalized in the stepdown unit for suspected drug overdose with initial concern for sepsis secondary to urinary tract infection.  Urine culture came back positive for pansensitive E. coli several days after hospitalization patient was also identified to have persisting left lower lobe infiltrate on 12/14 thought to be due to concurrent pneumonia.  Antibiotics were therefore broadened to cefepime and vancomycin on 12/14.  Clinical evidence of SIRS/sepsis resolved in the days that followed and course of therapy completed early in the morning on 12/19.  After several days of withholding all of the suspected overdosed agents patient began to exhibit extreme agitation tremulousness  and hallucinations concerning for benzodiazepine withdrawal.  Patient was then placed back on scheduled benzodiazepines and baclofen on 12/13.    From 12/13 until 12/19 patient's mentation gradually improved. Degree of agitation and confusion also gradually began to improve.  Extensive encephalopathy workup was undertaken including obtaining CT head, MRI brain and EEG all of which did not identify a contributing cause.  Psychiatry was also consulted on admission however their assessments have been obscured by ongoing encephalopathy felt to be due to both infection and drug overdose.  Due to guarded prognosis palliative care consultation was additionally obtained.    Assessment and Plan: * Intentional overdose ([facility]) Suspected intentional overdose, found to have open bottles of losartan, tizanidine, oxybutynin, baclofen and clonazepam by EMS.  Quantity is unclear.   Originally, all of patient's home medications were initially held due to the overdose  Patient was then resumed on benzodiazepines and baclofen on 12/13 due to concerns for withdrawal and have been slowly tapered since Continue sitter one-to-one safety precautions Will place IVC paperwork if patient attempts to elope. Psychiatry following, is assisting with management of delirium.  Their assistance is appreciated. Monitoring closely with serial labs and ongoing telemetry monitoring  Sepsis ([place]) Sepsis thought to be secondary to E. coli urinary tract infection on admission.  Then, several days later  left lower lobe infiltrate noted on chest x-ray 12/14 as an additional source. Antibiotic regimen was expanded from ceftriaxone monotherapy to cefepime and vancomycin on 12/14.  Antibiotics completed early the morning of 12/19. SIRS have resolved Following repeat blood cultures discontinuing intravenous fluids today.  Acute hypoxemic respiratory failure (HCC)-resolved as of 10/16/2022 - suspect in setting of OD - no overt signs of  aspiration of loss of consciousness - has been  weaned back to RA  Toxic metabolic encephalopathy Patient continues to exhibit confusion, episodes of possible myoclonus and visual hallucinations. Multifactorial.  Multiple contributing causes including underlying infection (resolved), drug overdose (resolved) and subsequent withdrawal (also resolved). Case discussed with Dr. Lorrin Goodell with Neurology today (12/19) to review what may be causing patient's encephalopathy to persist.  He recommends waiting 48 hours after the last cefepime dose to see if this helps (although patient was encephalopathic prior to the cefepime), keeping the patient on modest regimens of baclofen '10mg'$  twice daily and Clonazepam 0.'5mg'$  BID and obtaining a repeat EEG.   Seemingly slow clinical improvement overall although patient seems to have bouts of acute worsening MRI brain unremarkable Recent TSH, vitamin B12 unremarkable. EEG obtained on 12/15 without obvious seizure activity although this was substantially obscured by ongoing benzodiazepine use. Supportive care monitor for continued slow improvement.   GIB (gastrointestinal bleeding) Episode of bright red blood per rectum on 12/12 followed by additional small episode morning of 12/13  Patient's status post 2 unit packed blood cell transfusion on 12/12.   GI Consulted, they felt  that susspected etiology is due to stercoral colitis with ulceration No evidence of GI bleeding since with stable hemoglobin and hematocrit.    Bacteremia due to Escherichia coli Antibiotic course completed early the morning of 12/19.  Stercoral colitis history of severe constipation.   In the past, patient's wife was primarily involved in performing regular rectal stimulation and disimpactions prior to her death Aggressive laxative regimen ordered with MiraLAX senna and Colace with patient now moving his bowels regularly CT imaging suggestive of stercoral colitis  Given his underlying  paraplegia, suspect he has neurogenic bowel Disimpactions as needed  Metabolic acidosis Etiology suspected due to overdose Resolved  Severe recurrent major depression [facility]) Psychiatry following although patient is still suffering from some element of encephalopathy.  Their input is appreciated. Had a long discussion with patient's niece, [name] on 12/12 and again today (12/19).  Patient has become severely depressed since his wife passed away in 09/15/23.  She was essentially the patient's sole caretaker and he was completely dependent on her.  Since her passing, he has began "shutting down"  Pressure injury of sacral region, stage 2 ([place]) Present on admission Daily dressing changes Frequent turns  Essential hypertension Titrating amlodipine to 10 mg once daily PRN intravenous antihypertensives for excessively elevated blood pressure   Paraplegia (HCC) Frequent turns to minimize development of pressure ulcerations Fall cautions  GERD Continue oral Protonix  Hypokalemia Replaced   Goals of care, counseling/discussion Goals of care discussion 12/12 with [name] patient's niece along with multiple other family members. No one has power of attorney.  Decision making for patient's care has been shared between the patient's son-in-law, daughter-in-law and niece. discussed goals of care with niece and family on 12/14.  They are well aware of the patient's guarded prognosis.   Patient is currently DNR Palliative care has been consulted and is following along, their assistance is appreciated.  Elevated liver enzymes - possibly in setting of sepsis vs OD - continue IVF and trend LFTs    Subjective:  Patient continues to become more active and participating in conversation.  Patient has no complaints.  Physical Exam:  Vitals:   10/23/22 2100 10/24/22 0447 10/24/22 1545 10/24/22 2032  BP: 135/71 (!) 142/85 127/68 113/63  Pulse: 78 88 83 (!) 110  Resp: '20 18 18 19   '$ Temp: 98.9 F (37.2 C) 98.8 F (37.1 C) 98.9 F (  37.2 C) 99.1 F (37.3 C)  TempSrc: Oral Oral  Oral  SpO2: 93% 96% 96% 94%  Weight:      Height:        Constitutional: Confusion persisting although patient now oriented x 3.  Patient is not in any acute distress.  Patient is actively responding to internal stimuli. Skin: Stage II sacral wound present on admission, unchanged.  Poor skin turgor. Eyes: Pupils are equally reactive to light.  No evidence of scleral icterus or conjunctival pallor.  ENMT: Somewhat dry mucous membranes noted.  Posterior pharynx clear of any exudate or lesions.   Respiratory: No evidence of rales, no evidence of significant wheezing.  Normal respiratory effort. No accessory muscle use.  Cardiovascular: Regular rate and rhythm, no murmurs / rubs / gallops. No extremity edema. 2+ pedal pulses. No carotid bruits.  Abdomen: Abdomen is soft and nontender.  No evidence of intra-abdominal masses.  Positive bowel sounds noted in all quadrants.   Musculoskeletal: Poor muscle tone with bilateral lower extremities.  No evidence of contractures.   Neuro: Patient exhibiting intermittent jerking of the muscle groups of the legs.  Patient following, as consistently.  Patient exhibiting good strength in the bilateral upper extremities.  Patient has known paraplegia. Psych: Patient able to participate in most conversation.  That being said, patient continues to respond to visual hallucinations noted during my interview with the patient today.  Patient still does not seem to possess insight as to his current situation.   Data Reviewed:  I have personally reviewed and interpreted labs, imaging.  Significant findings are   CBC: Recent Labs  Lab 10/18/22 0322 10/18/22 1225 10/18/22 1706 10/21/22 0540 10/22/22 0534 10/23/22 0804 10/24/22 0558  WBC 16.8*  --   --  13.4* 10.3 10.1 10.4  NEUTROABS 13.9*  --   --  11.1* 7.9* 7.6 7.9*  HGB 13.3   < > 14.5 11.0* 11.6* 11.9* 12.5*   HCT 41.5   < > 45.1 35.6* 37.1* 37.1* 39.0  MCV 91.0  --   --  94.4 93.9 92.1 92.2  PLT 207  --   --  280 312 377 402*   < > = values in this interval not displayed.   Basic Metabolic Panel: Recent Labs  Lab 10/20/22 0300 10/21/22 0540 10/22/22 0534 10/23/22 0804 10/24/22 0558  NA 142 140 138 141 135  K 3.4* 3.7 3.6 3.7 3.5  CL 109 108 107 111 105  CO2 '23 25 24 24 23  '$ GLUCOSE 107* 106* 107* 108* 128*  BUN 8 6* 6* 9 9  CREATININE 0.64 0.55* 0.55* 0.60* 0.60*  CALCIUM 7.8* 7.8* 8.0* 8.4* 8.1*  MG 1.8 1.8 1.8 2.0 1.7  PHOS  --   --  3.2  --   --    GFR: Estimated Creatinine Clearance: 86.3 mL/min (A) (by C-G formula based on SCr of 0.6 mg/dL (L)). Liver Function Tests: Recent Labs  Lab 10/20/22 0300 10/21/22 0540 10/22/22 0534 10/23/22 0804 10/24/22 0558  AST 53* 36 39 37 35  ALT 30 28 34 35 35  ALKPHOS 51 52 53 57 55  BILITOT 1.0 0.8 0.6 0.8 0.9  PROT 5.2* 5.0* 5.1* 5.3* 5.5*  ALBUMIN 2.6* 2.6* 2.5* 2.7* 2.5*     EKG/Telemetry: Personally reviewed.  Rhythm is normal sinus rhythm with heart rate of  80 bpm.  No dynamic ST segment changes appreciated.   Code Status:  DNR.  Code status decision has been confirmed with: niece Family Communication:  Case discussed with patient's niece who has been updated on plan of care. Disposition: Possible admission to behavioral health after this hospitalization.  If not, likely skilled nursing facility is required.    Severity of Illness:  The appropriate patient status for this patient is INPATIENT. Inpatient status is judged to be reasonable and necessary in order to provide the required intensity of service to ensure the patient's safety. The patient's presenting symptoms, physical exam findings, and initial radiographic and laboratory data in the context of their chronic comorbidities is felt to place them at high risk for further clinical deterioration. Furthermore, it is not anticipated that the patient will be medically  stable for discharge from the hospital within 2 midnights of admission.   * I certify that at the point of admission it is my clinical judgment that the patient will require inpatient hospital care spanning beyond 2 midnights from the point of admission due to high intensity of service, high risk for further deterioration and high frequency of surveillance required.*  Time spent:  66 minutes  Author:  Vernelle Emerald MD  10/24/2022 9:13 PM

## 2022-10-24 NOTE — Progress Notes (Signed)
Unable to complete EKG due to excessive pt movement.  Pt not able to follow directions to lay quietly.  Will wait until after scheduled medication administration of Depakote to perform EKG.  Will continue to monitor.

## 2022-10-24 NOTE — Consult Note (Signed)
Brief Psychiatry Consult Note  Saw patient again today. In contrast to past week (marginally better each day with significant improvement in mental status with longer-term view), he has decompensated significantly. He is actively responding to internal stimuli, holding a pillow over his head, and trying to fight people he sees in the ceiling he believes will carve him up. He does make several statements about not wanting to die but doesn't really engage much in conversation. Attempted to verbally de-escalate with some success, although he did reveal he practices transcendental meditation when discussing that this experience is unique to his own perspective. Unfortunately last EKG with Qtc 520 (corrected to 468  using [place]) so elected to use IV depakote before haldol. Want to get another EKG in case it has worsened as this one was from 12/14.    Unclear what has precipitated this. Had a bit of a drop in BZD dosing but that was 2 days ago. Nursing staff said it started pretty abruptly about an hour ago when they tried to put on EEG leads.   - IV depakote 250 mg OTO  - if next EKG with reassuring Qtc, would order:  - haloperidol 2 mg/lorazepam 1 mg daily PRN for agitation/aggression/psychosis.   - please replete K+ to 4 and mg+ to 2 if pt receiving IV antipsychotics   [place]

## 2022-10-24 NOTE — TOC Progression Note (Signed)
Transition of Care [facility]) - Progression Note    Patient Details  Name: David Kim MRN: [id number] Date of Birth: [birth year]  [facility] of [facility]) CM/SW Contact  Artrell Lawless, Juliann Pulse, RN Phone Number: 10/24/2022, 11:13 AM  Clinical Narrative: Noted Psych following/Palliative care following. Await TOC recc for d/c plans.      Expected Discharge Plan:  (TBD) Barriers to Discharge: Continued Medical Work up  Expected Discharge Plan and Services Expected Discharge Plan:  (TBD)   Discharge Planning Services: CM Consult   Living arrangements for the past 2 months: Single Family Home                                       Social Determinants of Health (SDOH) Interventions    Readmission Risk Interventions     No data to display

## 2022-10-24 NOTE — Progress Notes (Signed)
Palliative:  HPI: 76 y.o. male  with past medical history of paraplegia, hypertension, PUD, GERD, IBS, OSA, prostate cancer, severe MDD with recent suicidal ideation requiring IVC in the ED admitted on 10/15/2022 with drug overdose and depression escalated after recent death of his wife.    I met again today with David Kim. Safety sitter at bedside. He is feeding himself applesauce. He is content and does engage at times but distracted by his applesauce. He answers most questions appropriately although still with poor recall and some confusion. He initially tells me that he has had no visitors today but later shares his sister-in-law David Kim came by (RN had shared David Kim visited earlier). He is content and pleasant with no complaints at time of my visit. I do ask him who he would trust to make medical decisions for him if he were unable and he names David Kim.   I spoke with [facility] via telephone. David Kim and her husband David Kim are primary contacts for David Kim at this time per family request. I shared with David Kim my visit with David Kim today and that he does seem to have improvements in his mental state each day I have seen him. David Kim shares that she was surprised to see him doing that well during her visit. David Kim is requesting family meeting with herself, brother-in-law David Kim, and niece David Kim. I explained that I am happy to oblige but there is likely limited information that I can provide them past what we have already discussed. I explain that we cannot make firm decisions at this time as David Kim's status continues to progress and it is dependent on his outcome and psychiatry evaluation and recommendations. David Kim verbalizes understanding. She reports that they do have a meeting arranged with an attorney to see how they can legally prepare to help care for David Kim and I think this is a good idea. We agreed to meet together 12/21 0930a. I will see if I can include psychiatry in meeting to clarify their recommendations  but I shared with David Kim that I do not know their schedule and I cannot guarantee their availability - she understands.   All questions/concerns addressed. Emotional support provided.   Exam: Alert, oriented to person and place. Ongoing confusion and poor recall but improved from previous visits. No distress. Breathing regular, unlabored. Abd soft.   Plan: - DNR in place - Family not inclined to escalate care if he has further medical decline - Meeting 15/17 6160  50 min  David Sill, NP Palliative Medicine Team Pager [phone] (Please see [email or web address] for schedule) Team Phone [phone]    Greater than 50%  of this time was spent counseling and coordinating care related to the above assessment and plan

## 2022-10-25 ENCOUNTER — Inpatient Hospital Stay (HOSPITAL_COMMUNITY)
Admit: 2022-10-25 | Discharge: 2022-10-25 | Disposition: A | Discharge status: Home / Self Care | Payer: Medicare Other | Attending: Internal Medicine | Admitting: Internal Medicine

## 2022-10-25 DIAGNOSIS — G928 Other toxic encephalopathy: Secondary | ICD-10-CM | POA: Diagnosis not present

## 2022-10-25 DIAGNOSIS — G9340 Encephalopathy, unspecified: Secondary | ICD-10-CM | POA: Diagnosis not present

## 2022-10-25 DIAGNOSIS — R7881 Bacteremia: Secondary | ICD-10-CM | POA: Diagnosis not present

## 2022-10-25 DIAGNOSIS — R4182 Altered mental status, unspecified: Secondary | ICD-10-CM | POA: Diagnosis not present

## 2022-10-25 DIAGNOSIS — T50902D Poisoning by unspecified drugs, medicaments and biological substances, intentional self-harm, subsequent encounter: Secondary | ICD-10-CM | POA: Diagnosis not present

## 2022-10-25 DIAGNOSIS — K5289 Other specified noninfective gastroenteritis and colitis: Secondary | ICD-10-CM | POA: Diagnosis not present

## 2022-10-25 LAB — CBC WITH DIFFERENTIAL/PLATELET
Abs Immature Granulocytes: 0.41 10*3/uL — ABNORMAL HIGH (ref 0.00–0.07)
Basophils Absolute: 0.1 10*3/uL (ref 0.0–0.1)
Basophils Relative: 1 %
Eosinophils Absolute: 0.3 10*3/uL (ref 0.0–0.5)
Eosinophils Relative: 3 %
HCT: 38.8 % — ABNORMAL LOW (ref 39.0–52.0)
Hemoglobin: 12.2 g/dL — ABNORMAL LOW (ref 13.0–17.0)
Immature Granulocytes: 4 %
Lymphocytes Relative: 12 %
Lymphs Abs: 1.2 10*3/uL (ref 0.7–4.0)
MCH: 29.2 pg (ref 26.0–34.0)
MCHC: 31.4 g/dL (ref 30.0–36.0)
MCV: 92.8 fL (ref 80.0–100.0)
Monocytes Absolute: 0.9 10*3/uL (ref 0.1–1.0)
Monocytes Relative: 9 %
Neutro Abs: 7 10*3/uL (ref 1.7–7.7)
Neutrophils Relative %: 71 %
Platelets: 401 10*3/uL — ABNORMAL HIGH (ref 150–400)
RBC: 4.18 MIL/uL — ABNORMAL LOW (ref 4.22–5.81)
RDW: 14.4 % (ref 11.5–15.5)
WBC: 9.9 10*3/uL (ref 4.0–10.5)
nRBC: 0 % (ref 0.0–0.2)

## 2022-10-25 LAB — MAGNESIUM: Magnesium: 2.1 mg/dL (ref 1.7–2.4)

## 2022-10-25 LAB — COMPREHENSIVE METABOLIC PANEL
ALT: 32 U/L (ref 0–44)
AST: 31 U/L (ref 15–41)
Albumin: 2.8 g/dL — ABNORMAL LOW (ref 3.5–5.0)
Alkaline Phosphatase: 53 U/L (ref 38–126)
Anion gap: 8 (ref 5–15)
BUN: 10 mg/dL (ref 8–23)
CO2: 25 mmol/L (ref 22–32)
Calcium: 8.1 mg/dL — ABNORMAL LOW (ref 8.9–10.3)
Chloride: 105 mmol/L (ref 98–111)
Creatinine, Ser: 0.63 mg/dL (ref 0.61–1.24)
GFR, Estimated: >60 mL/min (ref >60)
Glucose, Bld: 110 mg/dL — ABNORMAL HIGH (ref 70–99)
Potassium: 3.2 mmol/L — ABNORMAL LOW (ref 3.5–5.1)
Sodium: 138 mmol/L (ref 135–145)
Total Bilirubin: 0.5 mg/dL (ref 0.3–1.2)
Total Protein: 5.4 g/dL — ABNORMAL LOW (ref 6.5–8.1)

## 2022-10-25 MED ORDER — POTASSIUM CHLORIDE CRYS ER 20 MEQ PO TBCR
40.0000 meq | EXTENDED_RELEASE_TABLET | ORAL | Status: AC
  Administered 2022-10-25 (×2): 40 meq via ORAL
  Filled 2022-10-25 (×2): qty 2

## 2022-10-25 MED ORDER — RISPERIDONE 0.5 MG PO TABS
0.5000 mg | ORAL_TABLET | Freq: Every day | ORAL | Status: DC
  Administered 2022-10-25 – 2022-10-26 (×2): 0.5 mg via ORAL
  Filled 2022-10-25 (×2): qty 1

## 2022-10-25 MED ORDER — HALOPERIDOL 2 MG PO TABS: 2.0000 mg | ORAL_TABLET | Freq: Two times a day (BID) | ORAL | Status: DC | PRN

## 2022-10-25 MED ORDER — LORAZEPAM 1 MG PO TABS: 1.0000 mg | ORAL_TABLET | Freq: Two times a day (BID) | ORAL | Status: DC | PRN

## 2022-10-25 NOTE — Evaluation (Signed)
Occupational Therapy Evaluation Patient Details Name: David Kim MRN: [id number] DOB: August 27, 1946 Today's Date: 10/25/2022   History of Present Illness 7[name] is a 76 yo male with PMH paraplegia, severe MDD, persistent adjustment disorder with mixed anxiety and depressed mood ,PUD, gout, HTN, IBS, OSA, prostate cancer, GERD.   He was recently evaluated under IVC last month for expressing suicidal ideation. he was cleared for discharge home on 11/13.  Patient presented to [facility] long ED after home health aide discovered patient took an unknown amount of pills on 10/15/22   Clinical Impression   [name] is a 76 year old man who presents with above medical history. On evaluation he is able to grossly answer PLOF though with some inconsistency. At one point he reports his wife helps him at home but then he reports she died in 2023-09-13. He has difficulty telling me about his current caregiver and how often the caregiver is there - but he reports it is near 24/7 assist. At baseline - it appears patient is able to bathe and dress in the bed with setup, able to transfer himself with sliding board. Reports he wears depends but can also transfer himself to the toilet. A HH note in EP[facility] supports this. Today patient needing assistance to transfer in bed - he doesn't have the assistance of the trapeze bar here that he has at home. He needs increased assistance with ADLs at bed level. He is able to follow commands and he is agreeable to tasks. At end of treatment patient had some hallucinations. There is concern for safety and insight into deficits. Patient will benefit from skilled OT services while in hospital to improve deficits and learn compensatory strategies as needed in order to return to PLOF.  Recommend short term rehab to get patient back to modified independent.      Recommendations for follow up therapy are one component of a multi-disciplinary discharge planning process, led by the  attending physician.  Recommendations may be updated based on patient status, additional functional criteria and insurance authorization.   Follow Up Recommendations  Skilled nursing-short term rehab (<3 hours/day)     Assistance Recommended at Discharge Frequent or constant Supervision/Assistance  Patient can return home with the following A lot of help with walking and/or transfers;A lot of help with bathing/dressing/bathroom;Assistance with cooking/housework;Direct supervision/assist for medications management;Direct supervision/assist for financial management;Assist for transportation;Help with stairs or ramp for entrance    Functional Status Assessment  Patient has had a recent decline in their functional status and demonstrates the ability to make significant improvements in function in a reasonable and predictable amount of time.  Equipment Recommendations  None recommended by OT    Recommendations for Other Services       Precautions / Restrictions Precautions Precautions: Fall Precaution Comments: paraplegia, incontinence Restrictions Weight Bearing Restrictions: No      Mobility Bed Mobility Overal bed mobility: Needs Assistance Bed Mobility: Rolling, Sidelying to Sit, Sit to Sidelying Rolling: Min assist         General bed mobility comments: rolls with rail assistance, manually moves the legs to roll using UEs. Manually moves legs over bed edge, pulled up on  therapist(uses trapeze at  home) to sit upright. Able to place legs back onto bed to return to sidelying.    Transfers                          Balance Overall balance assessment: Needs  assistance Sitting-balance support: Single extremity supported Sitting balance-Leahy Scale: Fair Sitting balance - Comments: if holding rail, poor without UE                                   ADL either performed or assessed with clinical judgement   ADL Overall ADL's : Needs  assistance/impaired Eating/Feeding: Set up;Sitting   Grooming: Set up;Sitting   Upper Body Bathing: Set up;Sitting;Supervision/ safety   Lower Body Bathing: Maximal assistance;Bed level   Upper Body Dressing : Set up;Sitting   Lower Body Dressing: Maximal assistance;Bed level     Toilet Transfer Details (indicate cue type and reason): unable to transfer yet Toileting- Clothing Manipulation and Hygiene: Total assistance;Bed level Toileting - Clothing Manipulation Details (indicate cue type and reason): found to be incontinent             Vision   Vision Assessment?: No apparent visual deficits     Perception     Praxis      Pertinent Vitals/Pain Pain Assessment Pain Assessment: No/denies pain     Hand Dominance Right   Extremity/Trunk Assessment Upper Extremity Assessment Upper Extremity Assessment: Overall WFL for tasks assessed   Lower Extremity Assessment Lower Extremity Assessment: Defer to PT evaluation LLE Deficits / Details: increased flexor spasticity, leg positions in ER and flexed, patient manually pushes back to extension, lacks 20 * knee ext.   Cervical / Trunk Assessment Cervical / Trunk Assessment: Normal Cervical / Trunk Exceptions: trunk  with flexion in sitting,   Communication Communication Communication: No difficulties   Cognition Arousal/Alertness: Awake/alert Behavior During Therapy: WFL for tasks assessed/performed Overall Cognitive Status: No family/caregiver present to determine baseline cognitive functioning Area of Impairment: Orientation, Attention, Following commands, Awareness                 Orientation Level: Time, Situation Current Attention Level: Selective   Following Commands: Follows one step commands consistently   Awareness: Emergent   General Comments: able to privide info on how he transfers and PLOF. Is able to follow commands. Is seeing hallucination and somewhat paranoid     General Comments        Exercises     Shoulder Instructions      Home Living Family/patient expects to be discharged to:: Private residence Living Arrangements: Non-relatives/Friends Available Help at Discharge: Home health;Family Type of Home: House Home Access: Ramped entrance     Home Layout: One level     Bathroom Shower/Tub:  (Pt completed bathing at bed level; bathroom is handicapped accesible if needed)         Home Equipment: Wheelchair - manual;Other (comment);Hospital bed;Wheelchair - power   Additional Comments: Per [place] note in November: Brent at [facility] called to report pt has no need for full time OT assistance. Pt is living independently with no issues.  Pt has a home health aid that cooks, cleans, assists with any needs.  Pt is able to:  transfer in and out of bed.  ADLs are good  Manages his own prescriptions  Bathes himself with the aid handing him supplies. Has  trapeze      Prior Functioning/Environment               Mobility Comments: pt reports independence with sliding board transfers and manual w/c is his primary mode of locomotion ADLs Comments: patient sponge bathes at bed level and dresses at bed level. uses depends  for  B/B        OT Problem List: Decreased activity tolerance;Decreased strength;Impaired balance (sitting and/or standing);Decreased cognition;Decreased safety awareness;Decreased knowledge of use of DME or AE      OT Treatment/Interventions: Self-care/ADL training;Therapeutic exercise;DME and/or AE instruction;Therapeutic activities;Balance training;Patient/family education    OT Goals(Current goals can be found in the care plan section) Acute Rehab OT Goals OT Goal Formulation: Patient unable to participate in goal setting Time For Goal Achievement: 11/08/22 Potential to Achieve Goals: Fair  OT Frequency: Min 2X/week    Co-evaluation PT/OT/SLP Co-Evaluation/Treatment: Yes (coeval) Reason for Co-Treatment: To address functional/ADL transfers;For  patient/therapist safety PT goals addressed during session: Mobility/safety with mobility OT goals addressed during session: ADL's and self-care      AM-PAC OT "6 Clicks" Daily Activity     Outcome Measure Help from another person eating meals?: None Help from another person taking care of personal grooming?: A Little Help from another person toileting, which includes using toliet, bedpan, or urinal?: Total Help from another person bathing (including washing, rinsing, drying)?: A Lot Help from another person to put on and taking off regular upper body clothing?: A Little Help from another person to put on and taking off regular lower body clothing?: A Lot 6 Click Score: 15   End of Session Nurse Communication:  (Okay to see)  Activity Tolerance: Patient tolerated treatment well Patient left: in bed;with call bell/phone within reach;with nursing/sitter in room  OT Visit Diagnosis: Other symptoms and signs involving cognitive function                Time: 5366-4403 OT Time Calculation (min): 29 min Charges:  OT General Charges $OT Visit: 1 Visit OT Evaluation $OT Eval Low Complexity: 1 Low  Gustavo Lah, OTR/L [place]  Office [phone]   Lenward Chancellor 10/25/2022, 4:05 PM

## 2022-10-25 NOTE — Progress Notes (Signed)
EEG complete - results pending 

## 2022-10-25 NOTE — Evaluation (Signed)
Physical Therapy Evaluation Patient Details Name: David Kim MRN: [id number] DOB: [birth year] Today's Date: 10/25/2022  History of Present Illness  7Mr. Kim is a 76 yo male with PMH paraplegia, severe MDD, persistent adjustment disorder with mixed anxiety and depressed mood ,PUD, gout, HTN, IBS, OSA, prostate cancer, GERD.   He was recently evaluated under IVC last month for expressing suicidal ideation. he was cleared for discharge home on 11/13.  Patient presented to [facility] long ED after home health aide discovered patient took an unknown amount of pills on 10/15/22  Clinical Impression  Pt admitted with above diagnosis.  Pt currently with functional limitations due to the deficits listed below (see PT Problem List). Pt will benefit from skilled PT to increase their independence and safety with mobility to allow discharge to the venue listed below.     The patient is alert, able to  participate in  mobility, assisted to sitting on bed edge. Patient able to provide information about some of his function. Continue PT for mobility.      Recommendations for follow up therapy are one component of a multi-disciplinary discharge planning process, led by the attending physician.  Recommendations may be updated based on patient status, additional functional criteria and insurance authorization.  Follow Up Recommendations Skilled nursing-short term rehab (<3 hours/day) Can patient physically be transported by private vehicle: No    Assistance Recommended at Discharge Frequent or constant Supervision/Assistance  Patient can return home with the following  A lot of help with walking and/or transfers;A lot of help with bathing/dressing/bathroom;Assist for transportation;Help with stairs or ramp for entrance    Equipment Recommendations None recommended by PT  Recommendations for Other Services       Functional Status Assessment Patient has had a recent decline in their functional status and  demonstrates the ability to make significant improvements in function in a reasonable and predictable amount of time.     Precautions / Restrictions Precautions Precautions: Fall Precaution Comments: paraplegia, incontinence      Mobility  Bed Mobility Overal bed mobility: Needs Assistance Bed Mobility: Rolling, Sidelying to Sit, Sit to Sidelying Rolling: Min assist         General bed mobility comments: rolls with rail assistance, manually moves the legs to roll using UEs. Manually moves legs over bed edge, pulled up on  therapist(uses trapeze at  home) to sit upright. Able to place legs back onto bed to return to sidelying.    Transfers                        Ambulation/Gait                  Stairs            Wheelchair Mobility    Modified Rankin (Stroke Patients Only)       Balance Overall balance assessment: Needs assistance Sitting-balance support: Single extremity supported, Feet supported Sitting balance-Leahy Scale: Fair Sitting balance - Comments: if holding rail, poor without UE                                     Pertinent Vitals/Pain Pain Assessment Pain Assessment: No/denies pain    Home Living Family/patient expects to be discharged to:: Private residence Living Arrangements: Non-relatives/Friends Available Help at Discharge: Home health;Family Type of Home: House Home Access: Ramped entrance  Home Layout: One level Home Equipment: Wheelchair - manual;Other (comment);Hospital bed;Wheelchair - power Additional Comments: Per [place] note in November: Brent at [facility] called to report pt has no need for full time OT assistance. Pt is living independently with no issues.  Pt has a home health aid that cooks, cleans, assists with any needs.  Pt is able to:  transfer in and out of bed.  ADLs are good  Manages his own prescriptions  Bathes himself with the aid handing him supplies. Has  trapeze    Prior  Function               Mobility Comments: pt reports independence with sliding board transfers and manual w/c is his primary mode of locomotion ADLs Comments: patient sponge bathes at bed level and dresses at bed level. uses depends for  B/B     Hand Dominance   Dominant Hand: Right    Extremity/Trunk Assessment        Lower Extremity Assessment Lower Extremity Assessment: RLE deficits/detail;LLE deficits/detail LLE Deficits / Details: increased flexor spasticity, leg positions in ER and flexed, patient manually pushes back to extension, lacks 20 * knee ext.    Cervical / Trunk Assessment Cervical / Trunk Assessment: Other exceptions Cervical / Trunk Exceptions: trunk  with flexion in sitting,  Communication   Communication: No difficulties  Cognition Arousal/Alertness: Awake/alert Behavior During Therapy: Flat affect Overall Cognitive Status: No family/caregiver present to determine baseline cognitive functioning Area of Impairment: Orientation, Attention, Following commands, Awareness                 Orientation Level: Time, Situation Current Attention Level: Sustained   Following Commands: Follows one step commands consistently   Awareness: Emergent   General Comments: able to privide info on how he trnasfers        General Comments      Exercises     Assessment/Plan    PT Assessment Patient needs continued PT services  PT Problem List Decreased strength;Decreased cognition;Decreased balance;Decreased range of motion;Decreased mobility;Impaired sensation;Decreased activity tolerance;Decreased safety awareness;Decreased skin integrity       PT Treatment Interventions Functional mobility training;Balance training;Patient/family education;Therapeutic activities;Wheelchair mobility training;Therapeutic exercise;Cognitive remediation    PT Goals (Current goals can be found in the Care Plan section)  Acute Rehab PT Goals Patient Stated Goal: to get  OOB PT Goal Formulation: With patient Time For Goal Achievement: 11/08/22 Potential to Achieve Goals: Fair    Frequency Min 2X/week     Co-evaluation PT/OT/SLP Co-Evaluation/Treatment: Yes Reason for Co-Treatment: To address functional/ADL transfers;Necessary to address cognition/behavior during functional activity PT goals addressed during session: Mobility/safety with mobility;Balance OT goals addressed during session: ADL's and self-care       AM-PAC PT "6 Clicks" Mobility  Outcome Measure Help needed turning from your back to your side while in a flat bed without using bedrails?: A Little Help needed moving from lying on your back to sitting on the side of a flat bed without using bedrails?: A Little Help needed moving to and from a bed to a chair (including a wheelchair)?: Total Help needed standing up from a chair using your arms (e.g., wheelchair or bedside chair)?: Total Help needed to walk in hospital room?: Total Help needed climbing 3-5 steps with a railing? : Total 6 Click Score: 10    End of Session   Activity Tolerance: Patient tolerated treatment well Patient left: in bed;with call bell/phone within reach;with bed alarm set Nurse Communication: Mobility status PT Visit  Diagnosis: Other symptoms and signs involving the nervous system (R29.898)    Time: 1410-1431 PT Time Calculation (min) (ACUTE ONLY): 21 min   Charges:   PT Evaluation $PT Eval Low Complexity: 1 Low          [place] Office [phone] Weekend [phone]   Claretha Cooper 10/25/2022, 3:02 PM

## 2022-10-25 NOTE — Progress Notes (Deleted)
EEG complete - results pending 

## 2022-10-25 NOTE — Plan of Care (Signed)
  Problem: Safety: Goal: Ability to remain free from injury will improve Outcome: Progressing   Problem: Safety: Goal: Ability to remain free from injury will improve Outcome: Progressing   

## 2022-10-25 NOTE — Consult Note (Signed)
Brief Psychiatry Consult Note  Saw patient again today. Improved from yesterday - still actively RIS (fighting things with his pillow) but in much less distress, and more distractible. Was surprisingly oriented to self, location (got room number, but was looking at the wall), month and year. Did DOWB with no issues. He denied SI and any memory of his suicide attempt, stating "that's wrong. I want to live. I was screaming it all last night when someone tried to kill me". Denied HI. Does not think he is hallucinating, but clearly experiencing visual hallucinations.   EKG completed with improvement in Qtc.   Continue current meds START risperidone 0.5 mg QHS START haloperidol 2/lorazepam 1 BID PRN for agitation  - put in oral d/t IV lorazepam shortage. Could get same IV if oral unavailable.    - please replete K+ to 4 and mg+ to 2 if pt receiving IV antipsychotics   - repeat EKG in a couple of days   Prien

## 2022-10-25 NOTE — Progress Notes (Signed)
[facility]  PROGRESS NOTE  David Kim TML:[id number] DOB: 06/19/1946 DOA: 10/15/2022 PCP: Biagio Borg, MD   Brief HPI:   76 yo male with PMH paraplegia, severe MDD, persistent adjustment disorder with mixed anxiety and depressed mood ,PUD, gout, HTN, IBS, OSA, prostate cancer, GERD.  He was recently evaluated under IVC last month after being depressed and expressing suicidal ideation.  He was treated by psychiatry on their service and cleared for discharge home with decrease in his Klonopin dose on 11/13.  Patient presented to [facility] emergency department after home health aide discovered patient took an unknown amount of pills.   During the patient's emergency department evaluation, discussions with EMS revealed there were opened bottles of losartan, tizanidine, oxybutynin, baclofen, and clonazepam. Upon initial interview the patient endorsed at least taking clonazepam and tizanidine but would not provide additional information.  Initial evaluation also revealed multiple SIRS criteria with fever of 103.2 and UA suggestive of UTI prompting initiation of intravenous ceftriaxone.  Urine culture came back positive for E. coli which was pansensitive.  On 1214 also was found to have left lower lobe infiltrate on chest x-ray, antibiotic was changed to vancomycin and cefepime.   Subjective   Patient seen, has been having hallucinations.  Seeing blades on the ceiling and on his body.   Assessment/Plan:    Intentional overdose -Suspected intentional overdose; found to have open bottles of losartan, tizanidine, oxybutynin, baclofen and clonazepam by EMS -Initially all the medications were held due to overdose -Then benzodiazepines were resumed as they were concerned for withdrawal which has been slowly tapered off since then -Consider IVC paperwork if patient attempts to elope -Continue one-to-one sitter for safety precaution -Psych is following; started on Risperdal for  hallucinations  Sepsis -Secondary to E. coli UTI -Also found to have left lobe infiltrate on chest x-ray on 10/19/2022 -Antibiotics changed from ceftriaxone to cefepime and vancomycin -Antibiotics completed on 10/24/2022  Acute hypoxemic respiratory failure -Resolved  Metabolic encephalopathy -Continues to exhibit confusion, myoclonus and visual hallucinations -Psych as started on Risperdal today -Likely in setting of infection, drug overdose -MRI brain unremarkable -Recent TSH and B12 unremarkable -EEG on 10/20/2022 showed no obvious seizure activity  GI bleed -Episode of bright red blood per rectum on 12/12 -Patient is s/p 2 units PRBC on 12/12 -GI was consulted and they suspect etiology likely from stercoral colitis and ulceration -No evidence of GI bleed since then -H&H has been stable  Bacteremia due to E. Coli -Antibiotics course completed on 12/19  Stercoral colitis -History of severe constipation -In the past, patient's wife was primarily involved in performing regular rectal stimulation and disimpactions prior to her death -Continue MiraLAX, Colace, senna; patient moving his bowels regularly -CT imaging was suggestive of stercoral colitis -Patient history of paraplegia likely has neurogenic bowel -Continue disimpaction as needed  Severe recurrent major depression -Psych following -As per patient's niece; .  Patient has become severely depressed since his wife passed away in 2023/09/03.  She was essentially the patient's sole caretaker and he was completely dependent on her.  Since her passing, he has began "shutting down   Hypertension -Continue amlodipine  Paraplegia -Frequent turning to minimize element of pressure ulceration -Fall precautions  Hypokalemia -Potassium is 3.2 -Replace potassium and follow BMP in am   Goals of care, counseling/discussion Goals of care discussion 12/12 with [name] patient's niece along with multiple other family  members. No one has power of attorney.  Decision making for patient's  care has been shared between the patient's son-in-law, daughter-in-law and niece. discussed goals of care with niece and family on 12/14.  They are well aware of the patient's guarded prognosis.   Patient is currently DNR Palliative care has been consulted and is following along, their assistance is appreciated.  Medications     amLODipine  10 mg Oral Daily   baclofen  10 mg Oral BID   Chlorhexidine Gluconate Cloth  6 each Topical Daily   clonazePAM  0.5 mg Oral BID   docusate sodium  100 mg Oral BID   enoxaparin (LOVENOX) injection  40 mg Subcutaneous Q24H   leptospermum manuka honey  1 Application Topical Daily   melatonin  10 mg Oral QHS   pantoprazole  40 mg Oral Daily   polyethylene glycol  17 g Oral BID   risperiDONE  0.5 mg Oral QHS   senna-docusate  1 tablet Oral BID     Data Reviewed:   CBG:  No results for input(s): "GLUCAP" in the last 168 hours.  SpO2: 95 % O2 Flow Rate (L/min): 2 L/min    Vitals:   10/24/22 2032 10/25/22 0000 10/25/22 0450 10/25/22 1315  BP: 113/63  114/64 100/61  Pulse: (!) 110  83 89  Resp: '19  20 18  '$ Temp: 99.1 F (37.3 C) 98 F (36.7 C) 99 F (37.2 C) 97.6 F (36.4 C)  TempSrc: Oral Oral Oral Oral  SpO2: 94%  96% 95%  Weight:      Height:          Data Reviewed:  Basic Metabolic Panel: Recent Labs  Lab 10/21/22 0540 10/22/22 0534 10/23/22 0804 10/24/22 0558 10/25/22 0505  NA 140 138 141 135 138  K 3.7 3.6 3.7 3.5 3.2*  CL 108 107 111 105 105  CO2 '25 24 24 23 25  '$ GLUCOSE 106* 107* 108* 128* 110*  BUN 6* 6* '9 9 10  '$ CREATININE 0.55* 0.55* 0.60* 0.60* 0.63  CALCIUM 7.8* 8.0* 8.4* 8.1* 8.1*  MG 1.8 1.8 2.0 1.7 2.1  PHOS  --  3.2  --   --   --     CBC: Recent Labs  Lab 10/21/22 0540 10/22/22 0534 10/23/22 0804 10/24/22 0558 10/25/22 0505  WBC 13.4* 10.3 10.1 10.4 9.9  NEUTROABS 11.1* 7.9* 7.6 7.9* 7.0  HGB 11.0* 11.6* 11.9* 12.5* 12.2*   HCT 35.6* 37.1* 37.1* 39.0 38.8*  MCV 94.4 93.9 92.1 92.2 92.8  PLT 280 312 377 402* 401*    LFT Recent Labs  Lab 10/21/22 0540 10/22/22 0534 10/23/22 0804 10/24/22 0558 10/25/22 0505  AST 36 39 37 35 31  ALT 28 34 35 35 32  ALKPHOS 52 53 57 55 53  BILITOT 0.8 0.6 0.8 0.9 0.5  PROT 5.0* 5.1* 5.3* 5.5* 5.4*  ALBUMIN 2.6* 2.5* 2.7* 2.5* 2.8*     Antibiotics: Anti-infectives (From admission, onward)    Start     Dose/Rate Route Frequency Ordered Stop   10/20/22 0800  vancomycin (VANCOREADY) IVPB 750 mg/150 mL        750 mg 150 mL/hr over 60 Minutes Intravenous Every 12 hours 10/19/22 1936 10/24/22 2200   10/19/22 1800  vancomycin (VANCOREADY) IVPB 1500 mg/300 mL        1,500 mg 150 mL/hr over 120 Minutes Intravenous  Once 10/19/22 1653 10/19/22 2047   10/19/22 1800  ceFEPIme (MAXIPIME) 2 g in sodium chloride 0.9 % 100 mL IVPB        2 g  200 mL/hr over 30 Minutes Intravenous Every 8 hours 10/19/22 1653 10/24/22 0339   10/17/22 1815  metroNIDAZOLE (FLAGYL) IVPB 500 mg  Status:  Discontinued        500 mg 100 mL/hr over 60 Minutes Intravenous 2 times daily 10/17/22 1758 10/18/22 1135   10/16/22 2200  cefTRIAXone (ROCEPHIN) 2 g in sodium chloride 0.9 % 100 mL IVPB  Status:  Discontinued        2 g 200 mL/hr over 30 Minutes Intravenous Every 24 hours 10/16/22 1307 10/19/22 1636   10/16/22 2100  cefTRIAXone (ROCEPHIN) 1 g in sodium chloride 0.9 % 100 mL IVPB  Status:  Discontinued        1 g 200 mL/hr over 30 Minutes Intravenous Every 24 hours 10/15/22 2335 10/16/22 1307   10/15/22 2145  cefTRIAXone (ROCEPHIN) 2 g in sodium chloride 0.9 % 100 mL IVPB        2 g 200 mL/hr over 30 Minutes Intravenous  Once 10/15/22 2134 10/15/22 2223        DVT prophylaxis:   Code Status: DNR  Family Communication:    CONSULTS psychiatry   Objective    Physical Examination:   General: Appears in no acute distress Cardiovascular: S1-S2, regular Respiratory: Clear to  auscultation bilaterally Abdomen: Soft, nontender, no organomegaly Extremities: No edema in the lower extremities Neurologic/psychiatric: Alert, oriented x 3, having hallucinations   Status is: Inpatient:      Pressure Injury 10/16/22 Sacrum Anterior;Mid Unstageable - Full thickness tissue loss in which the base of the injury is covered by slough (yellow, tan, gray, green or brown) and/or eschar (tan, brown or black) in the wound bed. (Active)  10/16/22 0000  Location: Sacrum  Location Orientation: Anterior;Mid  Staging: Unstageable - Full thickness tissue loss in which the base of the injury is covered by slough (yellow, tan, gray, green or brown) and/or eschar (tan, brown or black) in the wound bed.  Wound Description (Comments):   Present on Admission: Yes     Pressure Injury 10/16/22 Heel Left;Anterior Stage 1 -  Intact skin with non-blanchable redness of a localized area usually over a bony prominence. (Active)  10/16/22 0000  Location: Heel  Location Orientation: Left;Anterior  Staging: Stage 1 -  Intact skin with non-blanchable redness of a localized area usually over a bony prominence.  Wound Description (Comments):   Present on Admission: Yes     Pressure Injury 10/24/22 Thigh Left;Posterior;Proximal Stage 2 -  Partial thickness loss of dermis presenting as a shallow open injury with a red, pink wound bed without slough. (Active)  10/24/22 1940  Location: Thigh  Location Orientation: Left;Posterior;Proximal  Staging: Stage 2 -  Partial thickness loss of dermis presenting as a shallow open injury with a red, pink wound bed without slough.  Wound Description (Comments):   Present on Admission:         [place]   [facility] If 7PM-7AM, please contact night-coverage at [email or web address], Office  [phone]   10/25/2022, 5:49 PM  LOS: 10 days

## 2022-10-26 DIAGNOSIS — I10 Essential (primary) hypertension: Secondary | ICD-10-CM | POA: Diagnosis not present

## 2022-10-26 DIAGNOSIS — F333 Major depressive disorder, recurrent, severe with psychotic symptoms: Secondary | ICD-10-CM | POA: Diagnosis not present

## 2022-10-26 DIAGNOSIS — G9340 Encephalopathy, unspecified: Secondary | ICD-10-CM

## 2022-10-26 DIAGNOSIS — Z7189 Other specified counseling: Secondary | ICD-10-CM | POA: Diagnosis not present

## 2022-10-26 DIAGNOSIS — E876 Hypokalemia: Secondary | ICD-10-CM | POA: Diagnosis not present

## 2022-10-26 DIAGNOSIS — Z515 Encounter for palliative care: Secondary | ICD-10-CM | POA: Diagnosis not present

## 2022-10-26 DIAGNOSIS — N3000 Acute cystitis without hematuria: Secondary | ICD-10-CM | POA: Diagnosis not present

## 2022-10-26 DIAGNOSIS — T50902A Poisoning by unspecified drugs, medicaments and biological substances, intentional self-harm, initial encounter: Secondary | ICD-10-CM | POA: Diagnosis not present

## 2022-10-26 LAB — BASIC METABOLIC PANEL
Anion gap: 6 (ref 5–15)
BUN: 12 mg/dL (ref 8–23)
CO2: 25 mmol/L (ref 22–32)
Calcium: 8.1 mg/dL — ABNORMAL LOW (ref 8.9–10.3)
Chloride: 106 mmol/L (ref 98–111)
Creatinine, Ser: 0.63 mg/dL (ref 0.61–1.24)
GFR, Estimated: >60 mL/min (ref >60)
Glucose, Bld: 115 mg/dL — ABNORMAL HIGH (ref 70–99)
Potassium: 3.4 mmol/L — ABNORMAL LOW (ref 3.5–5.1)
Sodium: 137 mmol/L (ref 135–145)

## 2022-10-26 MED ORDER — POTASSIUM CHLORIDE CRYS ER 20 MEQ PO TBCR
40.0000 meq | EXTENDED_RELEASE_TABLET | Freq: Once | ORAL | Status: DC
  Filled 2022-10-26: qty 2

## 2022-10-26 MED ORDER — POTASSIUM CHLORIDE CRYS ER 20 MEQ PO TBCR
40.0000 meq | EXTENDED_RELEASE_TABLET | ORAL | Status: DC
  Administered 2022-10-26: 40 meq via ORAL
  Filled 2022-10-26: qty 2

## 2022-10-26 NOTE — Progress Notes (Addendum)
[facility]  PROGRESS NOTE  David Kim NLG:[id number] DOB: December 05, 1945 DOA: 10/15/2022 PCP: Biagio Borg, MD   Brief HPI:   76 yo male with PMH paraplegia, severe MDD, persistent adjustment disorder with mixed anxiety and depressed mood ,PUD, gout, HTN, IBS, OSA, prostate cancer, GERD.  He was recently evaluated under IVC last month after being depressed and expressing suicidal ideation.  He was treated by psychiatry on their service and cleared for discharge home with decrease in his Klonopin dose on 11/13.  Patient presented to [facility] emergency department after home health aide discovered patient took an unknown amount of pills.   During the patient's emergency department evaluation, discussions with EMS revealed there were opened bottles of losartan, tizanidine, oxybutynin, baclofen, and clonazepam. Upon initial interview the patient endorsed at least taking clonazepam and tizanidine but would not provide additional information.  Initial evaluation also revealed multiple SIRS criteria with fever of 103.2 and UA suggestive of UTI prompting initiation of intravenous ceftriaxone.  Urine culture came back positive for E. coli which was pansensitive.  On 1214 also was found to have left lower lobe infiltrate on chest x-ray, antibiotic was changed to vancomycin and cefepime.   Subjective   Patient seen and examined, feels better this morning.  No hallucinations since starting Risperdal.   Assessment/Plan:    Intentional overdose -Suspected intentional overdose; found to have open bottles of losartan, tizanidine, oxybutynin, baclofen and clonazepam by EMS -Initially all the medications were held due to overdose -Then benzodiazepines were resumed as they were concerned for withdrawal which has been slowly tapered off since then -Consider IVC paperwork if patient attempts to elope -Continue one-to-one sitter for safety precaution -Psych is following; started on Risperdal  for hallucinations with some improvement   Sepsis -Secondary to E. coli UTI -Also found to have left lobe infiltrate on chest x-ray on 10/19/2022 -Antibiotics changed from ceftriaxone to cefepime and vancomycin -Antibiotics completed on 10/24/2022  Acute hypoxemic respiratory failure -Resolved  Metabolic encephalopathy -Continues to exhibit confusion, myoclonus and visual hallucinations -Psych as started on Risperdal today -Likely in setting of infection, drug overdose -MRI brain unremarkable -Recent TSH and B12 unremarkable -EEG on 10/20/2022 showed no obvious seizure activity  GI bleed -Episode of bright red blood per rectum on 12/12 -Patient is s/p 2 units PRBC on 12/12 -GI was consulted and they suspect etiology likely from stercoral colitis and ulceration -No evidence of GI bleed since then -H&H has been stable  Bacteremia due to E. Coli -Antibiotics course completed on 12/19  Stercoral colitis -History of severe constipation -In the past, patient's wife was primarily involved in performing regular rectal stimulation and disimpactions prior to her death -Continue MiraLAX, Colace, senna; patient moving his bowels regularly -CT imaging was suggestive of stercoral colitis -Patient has history of paraplegia, likely has neurogenic bowel -Continue disimpaction as needed  Severe recurrent major depression -Psych following -As per patient's niece; .  Patient has become severely depressed since his wife passed away in 2023-09-16.  She was essentially the patient's sole caretaker and he was completely dependent on her.  Since her passing, he has began "shutting down   Hypertension -Continue amlodipine  Paraplegia -Frequent turning to minimize element of pressure ulceration -Fall precautions  Hypokalemia -Potassium is 3.4 -Replace potassium and follow BMP in am   Goals of care, counseling/discussion Goals of care discussion 12/12 with [name] patient's niece along  with multiple other family members. No one has power of attorney.  Decision  making for patient's care has been shared between the patient's son-in-law, daughter-in-law and niece. discussed goals of care with niece and family on 12/14.  They are well aware of the patient's guarded prognosis.   Patient is currently DNR Palliative care has been consulted and is following along, their assistance is appreciated.  Medications     amLODipine  10 mg Oral Daily   baclofen  10 mg Oral BID   clonazePAM  0.5 mg Oral BID   docusate sodium  100 mg Oral BID   enoxaparin (LOVENOX) injection  40 mg Subcutaneous Q24H   leptospermum manuka honey  1 Application Topical Daily   melatonin  10 mg Oral QHS   pantoprazole  40 mg Oral Daily   polyethylene glycol  17 g Oral BID   potassium chloride  40 mEq Oral Q4H   risperiDONE  0.5 mg Oral QHS   senna-docusate  1 tablet Oral BID     Data Reviewed:   CBG:  No results for input(s): "GLUCAP" in the last 168 hours.  SpO2: 94 % O2 Flow Rate (L/min): 2 L/min    Vitals:   10/26/22 0300 10/26/22 0400 10/26/22 0500 10/26/22 0530  BP:    129/76  Pulse:    96  Resp: (!) 21 17 (!) 26 (!) 22  Temp:    98.3 F (36.8 C)  TempSrc:    Oral  SpO2:    94%  Weight:      Height:          Data Reviewed:  Basic Metabolic Panel: Recent Labs  Lab 10/21/22 0540 10/22/22 0534 10/23/22 0804 10/24/22 0558 10/25/22 0505 10/26/22 0420  NA 140 138 141 135 138 137  K 3.7 3.6 3.7 3.5 3.2* 3.4*  CL 108 107 111 105 105 106  CO2 '25 24 24 23 25 25  '$ GLUCOSE 106* 107* 108* 128* 110* 115*  BUN 6* 6* '9 9 10 12  '$ CREATININE 0.55* 0.55* 0.60* 0.60* 0.63 0.63  CALCIUM 7.8* 8.0* 8.4* 8.1* 8.1* 8.1*  MG 1.8 1.8 2.0 1.7 2.1  --   PHOS  --  3.2  --   --   --   --     CBC: Recent Labs  Lab 10/21/22 0540 10/22/22 0534 10/23/22 0804 10/24/22 0558 10/25/22 0505  WBC 13.4* 10.3 10.1 10.4 9.9  NEUTROABS 11.1* 7.9* 7.6 7.9* 7.0  HGB 11.0* 11.6* 11.9* 12.5* 12.2*   HCT 35.6* 37.1* 37.1* 39.0 38.8*  MCV 94.4 93.9 92.1 92.2 92.8  PLT 280 312 377 402* 401*    LFT Recent Labs  Lab 10/21/22 0540 10/22/22 0534 10/23/22 0804 10/24/22 0558 10/25/22 0505  AST 36 39 37 35 31  ALT 28 34 35 35 32  ALKPHOS 52 53 57 55 53  BILITOT 0.8 0.6 0.8 0.9 0.5  PROT 5.0* 5.1* 5.3* 5.5* 5.4*  ALBUMIN 2.6* 2.5* 2.7* 2.5* 2.8*     Antibiotics: Anti-infectives (From admission, onward)    Start     Dose/Rate Route Frequency Ordered Stop   10/20/22 0800  vancomycin (VANCOREADY) IVPB 750 mg/150 mL        750 mg 150 mL/hr over 60 Minutes Intravenous Every 12 hours 10/19/22 1936 10/24/22 2200   10/19/22 1800  vancomycin (VANCOREADY) IVPB 1500 mg/300 mL        1,500 mg 150 mL/hr over 120 Minutes Intravenous  Once 10/19/22 1653 10/19/22 2047   10/19/22 1800  ceFEPIme (MAXIPIME) 2 g in sodium chloride 0.9 % 100  mL IVPB        2 g 200 mL/hr over 30 Minutes Intravenous Every 8 hours 10/19/22 1653 10/24/22 0339   10/17/22 1815  metroNIDAZOLE (FLAGYL) IVPB 500 mg  Status:  Discontinued        500 mg 100 mL/hr over 60 Minutes Intravenous 2 times daily 10/17/22 1758 10/18/22 1135   10/16/22 2200  cefTRIAXone (ROCEPHIN) 2 g in sodium chloride 0.9 % 100 mL IVPB  Status:  Discontinued        2 g 200 mL/hr over 30 Minutes Intravenous Every 24 hours 10/16/22 1307 10/19/22 1636   10/16/22 2100  cefTRIAXone (ROCEPHIN) 1 g in sodium chloride 0.9 % 100 mL IVPB  Status:  Discontinued        1 g 200 mL/hr over 30 Minutes Intravenous Every 24 hours 10/15/22 2335 10/16/22 1307   10/15/22 2145  cefTRIAXone (ROCEPHIN) 2 g in sodium chloride 0.9 % 100 mL IVPB        2 g 200 mL/hr over 30 Minutes Intravenous  Once 10/15/22 2134 10/15/22 2223        DVT prophylaxis:   Code Status: DNR  Family Communication:    CONSULTS psychiatry   Objective    Physical Examination:   Appears in no acute distress Heart S1-S2, regular Lungs clear to auscultation bilaterally Abdomen  is soft, nontender, no organomegaly Alert, oriented x 3, following commands   Status is: Inpatient:      Pressure Injury 10/16/22 Sacrum Anterior;Mid Unstageable - Full thickness tissue loss in which the base of the injury is covered by slough (yellow, tan, gray, green or brown) and/or eschar (tan, brown or black) in the wound bed. (Active)  10/16/22 0000  Location: Sacrum  Location Orientation: Anterior;Mid  Staging: Unstageable - Full thickness tissue loss in which the base of the injury is covered by slough (yellow, tan, gray, green or brown) and/or eschar (tan, brown or black) in the wound bed.  Wound Description (Comments):   Present on Admission: Yes     Pressure Injury 10/16/22 Heel Left;Anterior Stage 1 -  Intact skin with non-blanchable redness of a localized area usually over a bony prominence. (Active)  10/16/22 0000  Location: Heel  Location Orientation: Left;Anterior  Staging: Stage 1 -  Intact skin with non-blanchable redness of a localized area usually over a bony prominence.  Wound Description (Comments):   Present on Admission: Yes     Pressure Injury 10/24/22 Thigh Left;Posterior;Proximal Stage 2 -  Partial thickness loss of dermis presenting as a shallow open injury with a red, pink wound bed without slough. (Active)  10/24/22 1940  Location: Thigh  Location Orientation: Left;Posterior;Proximal  Staging: Stage 2 -  Partial thickness loss of dermis presenting as a shallow open injury with a red, pink wound bed without slough.  Wound Description (Comments):   Present on Admission:         [place]   [facility] If 7PM-7AM, please contact night-coverage at [email or web address], Office  [phone]   10/26/2022, 9:50 AM  LOS: 11 days

## 2022-10-26 NOTE — Progress Notes (Signed)
Palliative:  HPI: 76 y.o. male  with past medical history of paraplegia, hypertension, PUD, GERD, IBS, OSA, prostate cancer, severe MDD with recent suicidal ideation requiring IVC in the ED admitted on 10/15/2022 with drug overdose and depression escalated after recent death of his wife.     I met today again with David Kim. He is distracted by trying to open the lid on his water jug in room. Sitter asks him if he is thirsty and wants any water or drink and he replies no. He is unable to tell us what he is trying to do. He makes rare eye contact. Not as engaged in conversation as previous days. I tell him that David Kim and David Kim may come by today. He does share that he is glad. He knows he is in the hospital. When asked why he tells me "I thought everyone was mad at me." I shared that I do not believe that is the case. He then mentions something about someone taking his guns. I told him that we don't want to take a chance of him harming himself. He tells me he does not want to harm himself. He also tells me that he tried to get into his gun safe but was unable to previously.   I met today with David Kim, and niece David Kim and Dr. Lovette Cliche via speaker phone. Dr. Lovette Cliche shed light on psychiatric plans and recommendations. Family were allowed to share insight into David Kim's history and ask questions. After long discussion they remain with a main concern and concern for ability to access David Kim's finances to ensure his insurance and bills are being paid. I explained again that I am unable to assist with guidance on this matter and I was hoping that their meeting with attorney would provide more guidance. Dr. Lovette Cliche and I communicate with CSW regarding concerns and request assistance and guidance for resources for family. Goals are clarified again for DNR and not to pursue any aggressive or invasive measures to prolong life. Conservative measures such as antibiotics and IVF are okay. We discussed that palliative care  role for David Kim is limited at this time. Recommend ongoing work with psychiatry and social work. Will sign off. Please re-consult is further medical decline and need for further goals of care conversations.   Upon further conversation family seems interested in having guardianship pursued with state as they are not sure that they are capable of being responsible for David Kim. TOC consulted.   All questions/concerns addressed. Emotional support provided.   Exam: Alert, oriented to person and place only and this fluctuates. Ongoing confusion and poor recall. No distress. Breathing regular, unlabored. Abd soft.   Plan: - DNR - Goals to continue conservative measures as indicated. No desire to escalate care to aggressive/invasive/ICU level of care. - Goals clear. Ongoing work up with psychiatry and social work. No further acute palliative needs at this time. Please re-consult as indicated. Will sign off.   [place], NP Palliative Medicine Team Pager [phone] (Please see [email or web address] for schedule) Team Phone [phone]    Greater than 50%  of this time was spent counseling and coordinating care related to the above assessment and plan

## 2022-10-26 NOTE — Consult Note (Signed)
[facility] Face-to-Face Psychiatry Consult   Reason for Consult:  suicidal attempt Referring Physician:  Dr. Cyd Silence Patient Identification: LAINE GIOVANETTI MRN:  [id number] Principal Diagnosis: Intentional overdose [facility]) Diagnosis:  Principal Problem:   Intentional overdose ([place]) Active Problems:   Severe recurrent major depression ([place])   Paraplegia ([place])   Essential hypertension   GERD   Toxic metabolic encephalopathy   Goals of care, counseling/discussion   Sepsis ([place])   Metabolic acidosis   Bacteremia due to Escherichia coli   Pressure injury of sacral region, stage 2 ([place])   Stercoral colitis   GIB (gastrointestinal bleeding)   Hypokalemia   Stercoral ulcer of rectum   Abnormal finding on GI tract imaging   Acute blood loss anemia   Chronic constipation   Total Time spent with patient: 45 minutes  Subjective:   MATTHEW CINA is a 76 y.o. male patient admitted after suicide attempt on multiple sedative/hypnotic type medications.   HPI: Patient is alert and oriented x 3. He knows his name, situation, and place. He tells me today is October 07, 2022. He is able to engage in a meaningful conversation today. He was very open and forthcoming about his previous psychiatric history, untreated depression and chronic suicidal ideations. He reports after the passing of his wife is when he went from passive to active suicidal ideations. " I never really came up with a plan and did it until after she died." Patient continues to endorse passive suicidal ideations, however does not have intent as he expresses dying by normal cause.He denies active suicidal ideations today. He reports his appetite is ok and he is observed eating his breakfast. He is able to correctly identify all the items on his breakfast tray. He does have some future orientation and seems motivated to get better as he expresses wanting to go home " I have som ebills to pay and Im glad to be doing better. I need to get out of  here. Christmas is coming up." He is tolerating his risperdal well at this time, denies any side effects or adverse reactions. On exam there appears to be no muscle stiffness, tightness, drooling, or oversedation.  At the request of PMT, psychiatrist reached out to family about psychiatric concerns. They contributed additional history to include "has been depressed and suicidal for 15 years, but didn't want to act on it until his wife passed. He is very strict and regimented to the point of being obsessive. He will not be able to go home - not a safe environment. " Family has taken 2 gallon bags of pills out of the house to dispose with expirations as far back as 2004. Patient has always been eccentric, but got mean after he was paralyzed and this furthered isolation, and would become agitated with wife at times.  They continue to remain concerned if he discharges home. They are interested in taking over legal affairs, finances. They are also interested in pursing legal guardianship at this time. The above concerns were addressed, all are valid concerns however the decision of guardianship will be determined by courts. Patient continues to remain a danger to self, and will need inpatient psychiatric exam after delirium and encephalopathy have resolved.   Past Medical History:  Past Medical History:  Diagnosis Date   Allergic rhinitis    ALLERGIC RHINITIS 10/01/2007   Qualifier: Diagnosis of  By: Jenny Reichmann MD, Hunt Oris    Anxiety    Carpal tunnel syndrome    right  Depression    Depression    Erectile dysfunction    Fatigue    GERD (gastroesophageal reflux disease)    Gout    Hypertension    HYPOGONADISM 03/31/2008   Qualifier: Diagnosis of  By: Jenny Reichmann MD, Hunt Oris    IBS (irritable bowel syndrome)    Low back pain    OSA (obstructive sleep apnea)    Paraplegia ([place]) 04/23/2009   Qualifier: Diagnosis of  By: Jenny Reichmann MD, Hunt Oris    Peptic ulcer disease    PEPTIC ULCER DISEASE 10/01/2007   Qualifier:  Diagnosis of  By: Jenny Reichmann MD, Hunt Oris    Prostate cancer [facility])    PROSTATE CANCER, HX OF 05/23/2007   Qualifier: Diagnosis of  By: Jenny Reichmann MD, [place] PARALYSIS 04/23/2009   Qualifier: Diagnosis of  By: Jenny Reichmann MD, Hunt Oris    Thoracic spinal cord injury [facility]) 03/12/2012    Past Surgical History:  Procedure Laterality Date   ARTERY AND TENDON REPAIR Left 09/06/2018   Procedure: ARTERY AND TENDON REPAIR;  Surgeon: Charlotte Crumb, MD;  Location: [place];  Service: Orthopedics;  Laterality: Left;   CAST APPLICATION Left 40/07/7352   Procedure: CAST APPLICATION;  Surgeon: Charlotte Crumb, MD;  Location: [place];  Service: Orthopedics;  Laterality: Left;   inguinal heniorrhaphy     left leg surgery after fibula     NERVE REPAIR Left 09/06/2018   Procedure: NERVE REPAIR TIMES TWO;  Surgeon: Charlotte Crumb, MD;  Location: [place];  Service: Orthopedics;  Laterality: Left;   OPEN REDUCTION INTERNAL FIXATION (ORIF) DISTAL PHALANX Left 09/06/2018   Procedure: OPEN REDUCTION INTERNAL FIXATION (ORIF) LEFT LONG FINGER;  Surgeon: Charlotte Crumb, MD;  Location: [place];  Service: Orthopedics;  Laterality: Left;   PILONIDAL CYST / SINUS EXCISION     PROSTATECTOMY     tonsillectomy     WOUND EXPLORATION Left 09/06/2018   Procedure: EXPLORATION OFCOMPLEX INJURY;  Surgeon: Charlotte Crumb, MD;  Location: [place];  Service: Orthopedics;  Laterality: Left;   Family History:  Family History  Problem Relation Age of Onset   High blood pressure Mother    Dementia Mother    Diabetes Father     Social History:  Social History   Substance and Sexual Activity  Alcohol Use No     Social History   Substance and Sexual Activity  Drug Use No    Social History   Socioeconomic History   Marital status: Married    Spouse name: [name]   Number of children: 0   Years of education: 12   Highest education level: Not on file  Occupational History   Occupation: retired    Fish farm manager: RETIRED  Tobacco Use    Smoking status: Never   Smokeless tobacco: Never  Vaping Use   Vaping Use: Never used  Substance and Sexual Activity   Alcohol use: No   Drug use: No   Sexual activity: Not on file  Other Topics Concern   Not on file  Social History Narrative   Patient lives at home with [name]). Patient is disabled. Patient has 12 th grade education.   Right handed.   Caffeine- None   Social Determinants of Health   Financial Resource Strain: Low Risk  (07/24/2022)   Overall Financial Resource Strain (CARDIA)    Difficulty of Paying Living Expenses: Not hard at all  Food Insecurity: No Food Insecurity (10/20/2022)   Hunger Vital Sign  Worried About Charity fundraiser in the Last Year: Never true    [place] in the Last Year: Never true  Transportation Needs: No Transportation Needs (10/20/2022)   PRAPARE - Hydrologist (Medical): No    Lack of Transportation (Non-Medical): No  Physical Activity: Inactive (07/24/2022)   Exercise Vital Sign    Days of Exercise per Week: 0 days    Minutes of Exercise per Session: 0 min  Stress: No Stress Concern Present (07/24/2022)   [place]    Feeling of Stress : Not at all  Social Connections: Moderately Isolated (07/24/2022)   Social Connection and Isolation Panel [NHANES]    Frequency of Communication with Friends and Family: More than three times a week    Frequency of Social Gatherings with Friends and Family: More than three times a week    Attends Religious Services: Never    Marine scientist or Organizations: No    Attends Archivist Meetings: Never    Marital Status: Married   Additional Social History:    Allergies:   Allergies  Allergen Reactions   Amoxicillin Other (See Comments)    Unknown    Endal Hd Other (See Comments)    Unknown     Labs:  Results for orders placed or performed during the hospital  encounter of 10/15/22 (from the past 48 hour(s))  CBC with Differential/Platelet     Status: Abnormal   Collection Time: 10/25/22  5:05 AM  Result Value Ref Range   WBC 9.9 4.0 - 10.5 K/uL   RBC 4.18 (L) 4.22 - 5.81 MIL/uL   Hemoglobin 12.2 (L) 13.0 - 17.0 g/dL   HCT 38.8 (L) 39.0 - 52.0 %   MCV 92.8 80.0 - 100.0 fL   MCH 29.2 26.0 - 34.0 pg   MCHC 31.4 30.0 - 36.0 g/dL   RDW 14.4 11.5 - 15.5 %   Platelets 401 (H) 150 - 400 K/uL   nRBC 0.0 0.0 - 0.2 %   Neutrophils Relative % 71 %   Neutro Abs 7.0 1.7 - 7.7 K/uL   Lymphocytes Relative 12 %   Lymphs Abs 1.2 0.7 - 4.0 K/uL   Monocytes Relative 9 %   Monocytes Absolute 0.9 0.1 - 1.0 K/uL   Eosinophils Relative 3 %   Eosinophils Absolute 0.3 0.0 - 0.5 K/uL   Basophils Relative 1 %   Basophils Absolute 0.1 0.0 - 0.1 K/uL   Immature Granulocytes 4 %   Abs Immature Granulocytes 0.41 (H) 0.00 - 0.07 K/uL    Comment: Performed at [facility], [place] [address], [place], [place] [address]  Comprehensive metabolic panel     Status: Abnormal   Collection Time: 10/25/22  5:05 AM  Result Value Ref Range   Sodium 138 135 - 145 mmol/L   Potassium 3.2 (L) 3.5 - 5.1 mmol/L   Chloride 105 98 - 111 mmol/L   CO2 25 22 - 32 mmol/L   Glucose, Bld 110 (H) 70 - 99 mg/dL    Comment: Glucose reference range applies only to samples taken after fasting for at least 8 hours.   BUN 10 8 - 23 mg/dL   Creatinine, Ser 0.63 0.61 - 1.24 mg/dL   Calcium 8.1 (L) 8.9 - 10.3 mg/dL   Total Protein 5.4 (L) 6.5 - 8.1 g/dL   Albumin 2.8 (L) 3.5 - 5.0 g/dL  AST 31 15 - 41 U/L   ALT 32 0 - 44 U/L   Alkaline Phosphatase 53 38 - 126 U/L   Total Bilirubin 0.5 0.3 - 1.2 mg/dL   GFR, Estimated >60 >60 mL/min    Comment: (NOTE) Calculated using the CKD-EPI Creatinine Equation (2021)    Anion gap 8 5 - 15    Comment: Performed at [facility], [place] [address] [place], [place] [address]  Magnesium     Status: None   Collection  Time: 10/25/22  5:05 AM  Result Value Ref Range   Magnesium 2.1 1.7 - 2.4 mg/dL    Comment: Performed at [facility], [place] [address], [place], [place] [address]  Basic metabolic panel     Status: Abnormal   Collection Time: 10/26/22  4:20 AM  Result Value Ref Range   Sodium 137 135 - 145 mmol/L   Potassium 3.4 (L) 3.5 - 5.1 mmol/L   Chloride 106 98 - 111 mmol/L   CO2 25 22 - 32 mmol/L   Glucose, Bld 115 (H) 70 - 99 mg/dL    Comment: Glucose reference range applies only to samples taken after fasting for at least 8 hours.   BUN 12 8 - 23 mg/dL   Creatinine, Ser 0.63 0.61 - 1.24 mg/dL   Calcium 8.1 (L) 8.9 - 10.3 mg/dL   GFR, Estimated >60 >60 mL/min    Comment: (NOTE) Calculated using the CKD-EPI Creatinine Equation (2021)    Anion gap 6 5 - 15    Comment: Performed at [facility], [place] [address] [place], [place] [address]    Current Facility-Administered Medications  Medication Dose Route Frequency Provider Last Rate Last Admin   0.9 %  sodium chloride infusion   Intravenous PRN Shalhoub, Sherryll Burger, MD       acetaminophen (TYLENOL) tablet 650 mg  650 mg Oral Q6H PRN Dwyane Dee, MD   650 mg at 10/17/22 1093   Or   acetaminophen (TYLENOL) suppository 650 mg  650 mg Rectal Q6H PRN Dwyane Dee, MD   650 mg at 10/17/22 1526   amLODipine (NORVASC) tablet 10 mg  10 mg Oral Daily Vernelle Emerald, MD   10 mg at 10/26/22 1136   baclofen (LIORESAL) tablet 10 mg  10 mg Oral BID Vernelle Emerald, MD   10 mg at 10/26/22 1132   clonazePAM (KLONOPIN) tablet 0.5 mg  0.5 mg Oral BID Vernelle Emerald, MD   0.5 mg at 10/26/22 1131   docusate sodium (COLACE) capsule 100 mg  100 mg Oral BID Mansouraty, Telford Nab., MD   100 mg at 10/26/22 1137   enoxaparin (LOVENOX) injection 40 mg  40 mg Subcutaneous Q24H Dwyane Dee, MD   40 mg at 10/26/22 1135   haloperidol (HALDOL) tablet 2 mg  2 mg Oral BID PRN Cinderella, Margaret A       And   LORazepam  (ATIVAN) tablet 1 mg  1 mg Oral BID PRN Cinderella, Margaret A       hydrALAZINE (APRESOLINE) injection 10 mg  10 mg Intravenous Q6H PRN Shalhoub, Sherryll Burger, MD       leptospermum manuka honey ([place]) paste 1 Application  1 Application Topical Daily Dwyane Dee, MD   1 Application at 23/55/73 1137   melatonin tablet 10 mg  10 mg Oral QHS Vernelle Emerald, MD   10 mg at 10/25/22 2123   Oral care mouth rinse  15 mL  Mouth Rinse PRN Shalhoub, Sherryll Burger, MD       pantoprazole (PROTONIX) EC tablet 40 mg  40 mg Oral Daily Shalhoub, Sherryll Burger, MD   40 mg at 10/26/22 1131   polyethylene glycol (MIRALAX / GLYCOLAX) packet 17 g  17 g Oral BID [place], Amy S, PA-C   17 g at 10/26/22 1136   polyvinyl alcohol (LIQUIFILM TEARS) 1.4 % ophthalmic solution 1 drop  1 drop Both Eyes PRN Dwyane Dee, MD   1 drop at 10/17/22 2229   potassium chloride SA (KLOR-CON M) CR tablet 40 mEq  40 mEq Oral Once [place], MD       risperiDONE (RISPERDAL) tablet 0.5 mg  0.5 mg Oral QHS Cinderella, Margaret A   0.5 mg at 10/25/22 2124   senna-docusate (Senokot-S) tablet 1 tablet  1 tablet Oral BID Dwyane Dee, MD   1 tablet at 10/26/22 1137   tiZANidine (ZANAFLEX) tablet 4 mg  4 mg Oral Q6H PRN Vernelle Emerald, MD   4 mg at 10/25/22 0845    Musculoskeletal: Strength & Muscle Tone: decreased Gait & Station: unable to stand Patient leans: N/A   Psychiatric Specialty Exam:   Appearance:  CM, ill-appearing, in bed, nasal cannula, wearing hospital clothes, with ok grooming and hygiene. Decreased level of alertness .  Attitude/Behavior: partially-engaging with no eye contact.  Motor: WNL; dyskinesias not evident.   Speech: rumbling, low volume, occasionally incoherent.  Mood: dysthymic  Affect: blunted, flat.  Thought process: patient appears disorganized, illogical, associations are not appropriate.  Thought content: patient appears delusional, he denies suicidal thoughts, denies homicidal  thoughts.  Thought perception: patient possible experiences visual hallucinations.   Cognition: patient is alert and partially-oriented in self, place, month, not in situation; with impaired attention and concentration.  Insight: poor  Judgement: poor   Physical Exam: Physical Exam Vitals and nursing note reviewed.  Constitutional:      Appearance: Normal appearance.  HENT:     Head: Normocephalic.  Skin:    General: Skin is warm.  Neurological:     General: No focal deficit present.     Mental Status: He is alert and oriented to person, place, and time. Mental status is at baseline.  Psychiatric:        Mood and Affect: Mood normal.        Thought Content: Thought content normal.        Judgment: Judgment normal.    ROS Blood pressure (!) 148/77, pulse 96, temperature 99.1 F (37.3 C), temperature source Oral, resp. rate 20, height '5\' 8"'$  (1.727 m), weight 85.7 kg, SpO2 94 %. Body mass index is 28.73 kg/m.  Treatment Plan Summary: Daily contact with patient to assess and evaluate symptoms and progress in treatment and Medication management  Assessment and Plan: Patient is a 76 y.o. male patient admitted after suicide attempt on multiple sedative/hypnotic type medications. Patient initally presented with a diagnosis of delirium, that appears to be improving at this time.  He remains on Risperdal 0.'25mg'$  po daily. Continue current medications at this time.  Continue the following delirium precautions below.  As with all hospitalized patients, would recommend delirium precautions: Minimize use of benzodiazepines, anticholinergics, and opiates, to the extent possible, as these can worsen mentation. Utilize non-pharmacological interventions including frequent reorientation, maintaining day/night distinction (blinds closed during night, open during day), minimizing excessive stimulation, staff continuity, reorient the patient frequently, provide easily visible clock and calendar,  provide sensory aids like glasses, hearing aids,  encourage ambulation, regular activities and visitors to maintain cognitive stimulation. Patient will need inpatient psychiatric hospitalization as he has always vocalized this presentation as a suicide attempt.  -Continue working closely with TOC to provide family support and concerns about competency and legal guardianship.   Suella Broad, FNP 10/26/2022 5:29 PM

## 2022-10-26 NOTE — Care Management Important Message (Signed)
Important Message  Patient Details IM Letter given. Name: David Kim MRN: [id number] Date of Birth: [birth year]   Medicare Important Message Given:  Yes     Kerin Salen 10/26/2022, 10:52 AM

## 2022-10-26 NOTE — TOC Progression Note (Addendum)
Transition of Care [facility]) - Progression Note    Patient Details  Name: David Kim MRN: [id number] Date of Birth: 12/22/1945  [facility]) CM/SW Contact  Nahara Dona, Juliann Pulse, RN Phone Number: 10/26/2022, 12:04 PM  Clinical Narrative: Noted PT/OT-SNF;Psych-meds adjustment;Palliative-following. Await medical stability,& recc to asst w/appropriate d/c plans.  -received referral for guardianship.Noted APS has been involved in past for guardianship but has not been successful,therefore unless guardianship has been resolved unable to asst family w/accessing finances for patient. Can asst w/referral to APS for guardianship if consult placed. Supv/MD notified.     Planned Disposition:  (TBD) Barriers to Discharge: Continued Medical Work up  Expected Discharge Plan and Services   Discharge Planning Services: CM Consult   Living arrangements for the past 2 months: [place] Determinants of Health (SDOH) Interventions SDOH Screenings   Food Insecurity: No Food Insecurity (10/20/2022)  Housing: Low Risk  (10/20/2022)  Transportation Needs: No Transportation Needs (10/20/2022)  Utilities: Not At Risk (10/20/2022)  Alcohol Screen: Low Risk  (07/24/2022)  Depression (PHQ2-9): Low Risk  (04/05/2022)  Financial Resource Strain: Low Risk  (07/24/2022)  Physical Activity: Inactive (07/24/2022)  Social Connections: Moderately Isolated (07/24/2022)  Stress: No Stress Concern Present (07/24/2022)  Tobacco Use: Low Risk  (10/20/2022)    Readmission Risk Interventions     No data to display

## 2022-10-27 DIAGNOSIS — N3000 Acute cystitis without hematuria: Secondary | ICD-10-CM | POA: Diagnosis not present

## 2022-10-27 DIAGNOSIS — G9340 Encephalopathy, unspecified: Secondary | ICD-10-CM | POA: Diagnosis not present

## 2022-10-27 DIAGNOSIS — E876 Hypokalemia: Secondary | ICD-10-CM | POA: Diagnosis not present

## 2022-10-27 DIAGNOSIS — I10 Essential (primary) hypertension: Secondary | ICD-10-CM | POA: Diagnosis not present

## 2022-10-27 LAB — BASIC METABOLIC PANEL
Anion gap: 8 (ref 5–15)
BUN: 13 mg/dL (ref 8–23)
CO2: 23 mmol/L (ref 22–32)
Calcium: 8.4 mg/dL — ABNORMAL LOW (ref 8.9–10.3)
Chloride: 107 mmol/L (ref 98–111)
Creatinine, Ser: 0.59 mg/dL — ABNORMAL LOW (ref 0.61–1.24)
GFR, Estimated: >60 mL/min (ref >60)
Glucose, Bld: 117 mg/dL — ABNORMAL HIGH (ref 70–99)
Potassium: 3.9 mmol/L (ref 3.5–5.1)
Sodium: 138 mmol/L (ref 135–145)

## 2022-10-27 MED ORDER — RISPERIDONE 1 MG PO TABS
1.0000 mg | ORAL_TABLET | Freq: Every day | ORAL | Status: DC
  Administered 2022-10-27 – 2022-10-31 (×5): 1 mg via ORAL
  Filled 2022-10-27 (×5): qty 1

## 2022-10-27 NOTE — TOC Progression Note (Signed)
Transition of Care [facility]) - Progression Note    Patient Details  Name: David Kim MRN: [id number] Date of Birth: Oct 17, 1946  Transition of Care [facility]) CM/SW Contact  Henrietta Dine, RN Phone Number: [phone] 10/27/2022, 4:12 PM  Clinical Narrative:    Notified by [facility] that pt can not be offered bed b/c facility at capacity.   Expected Discharge Plan:  (TBD) Barriers to Discharge: Continued Medical Work up  Expected Discharge Plan and Services   Discharge Planning Services: CM Consult   Living arrangements for the past 2 months: Single Family Home                                       Social Determinants of Health (SDOH) Interventions SDOH Screenings   Food Insecurity: No Food Insecurity (10/20/2022)  Housing: Low Risk  (10/20/2022)  Transportation Needs: No Transportation Needs (10/20/2022)  Utilities: Not At Risk (10/20/2022)  Alcohol Screen: Low Risk  (07/24/2022)  Depression (PHQ2-9): Low Risk  (04/05/2022)  Financial Resource Strain: Low Risk  (07/24/2022)  Physical Activity: Inactive (07/24/2022)  Social Connections: Moderately Isolated (07/24/2022)  Stress: No Stress Concern Present (07/24/2022)  Tobacco Use: Low Risk  (10/20/2022)    Readmission Risk Interventions    10/27/2022   10:19 AM  Readmission Risk Prevention Plan  Transportation Screening Complete  PCP or Specialist Appt within 5-7 Days Complete  Home Care Screening Complete  Medication Review (RN CM) Complete

## 2022-10-27 NOTE — Progress Notes (Signed)
Pt has experienced hallucinations throughout the night, despite LOC X4.  Pt describes his hallucinations as seeing things on the ceiling, things floating around.  Pt stated that the plastic on the ceiling/wall seems like it is moving closer to him. Seeing what he described as dust but bigger floating around. Fixated with holding a pillow above his head which he states he is using a shield from the plastic on the ceiling from falling onto him. Pt Understands that others cannot see these things but he can. Remains pleasant affect and was able to sleep roughly 3 hours.

## 2022-10-27 NOTE — Progress Notes (Signed)
[facility]  PROGRESS NOTE  David Kim CLE:[id number] DOB: November 01, 1946 DOA: 10/15/2022 PCP: Biagio Borg, MD   Brief HPI:   76 yo male with PMH paraplegia, severe MDD, persistent adjustment disorder with mixed anxiety and depressed mood ,PUD, gout, HTN, IBS, OSA, prostate cancer, GERD.  He was recently evaluated under IVC last month after being depressed and expressing suicidal ideation.  He was treated by psychiatry on their service and cleared for discharge home with decrease in his Klonopin dose on 11/13.  Patient presented to [facility] emergency department after home health aide discovered patient took an unknown amount of pills.   During the patient's emergency department evaluation, discussions with EMS revealed there were opened bottles of losartan, tizanidine, oxybutynin, baclofen, and clonazepam. Upon initial interview the patient endorsed at least taking clonazepam and tizanidine but would not provide additional information.  Initial evaluation also revealed multiple SIRS criteria with fever of 103.2 and UA suggestive of UTI prompting initiation of intravenous ceftriaxone.  Urine culture came back positive for E. coli which was pansensitive.  On 1214 also was found to have left lower lobe infiltrate on chest x-ray, antibiotic was changed to vancomycin and cefepime.   Subjective   Patient seen and examined, denies new complaints.   Assessment/Plan:    Intentional overdose -Suspected intentional overdose; found to have open bottles of losartan, tizanidine, oxybutynin, baclofen and clonazepam by EMS -Initially all the medications were held due to overdose -Then benzodiazepines were resumed as they were concerned for withdrawal which has been slowly tapered off since then -Consider IVC paperwork if patient attempts to elope -Continue one-to-one sitter for safety precaution -Psych is following; started on Risperdal for hallucinations with some  improvement   Sepsis -Secondary to E. coli UTI -Also found to have left lobe infiltrate on chest x-ray on 10/19/2022 -Antibiotics changed from ceftriaxone to cefepime and vancomycin -Antibiotics completed on 10/24/2022  Acute hypoxemic respiratory failure -Resolved  Metabolic encephalopathy -Continues to exhibit confusion, myoclonus and visual hallucinations -Psych as started on Risperdal today -Likely in setting of infection, drug overdose -MRI brain unremarkable -Recent TSH and B12 unremarkable -EEG on 10/20/2022 showed no obvious seizure activity  GI bleed -Episode of bright red blood per rectum on 12/12 -Patient is s/p 2 units PRBC on 12/12 -GI was consulted and they suspect etiology likely from stercoral colitis and ulceration -No evidence of GI bleed since then -H&H has been stable  Bacteremia due to E. Coli -Antibiotics course completed on 12/19  Stercoral colitis -History of severe constipation -In the past, patient's wife was primarily involved in performing regular rectal stimulation and disimpactions prior to her death -Continue MiraLAX, Colace, senna; patient moving his bowels regularly -CT imaging was suggestive of stercoral colitis -Patient has history of paraplegia, likely has neurogenic bowel -Continue disimpaction as needed  Severe recurrent major depression -Psych following -As per patient's niece; .  Patient has become severely depressed since his wife passed away in 09/10/23.  She was essentially the patient's sole caretaker and he was completely dependent on her.  Since her passing, he has began "shutting down   Hypertension -Continue amlodipine  Paraplegia -Frequent turning to minimize element of pressure ulceration -Fall precautions  Hypokalemia -Potassium is 3.4 -Replace potassium and follow BMP in am   Goals of care, counseling/discussion Goals of care discussion 12/12 with [name] patient's niece along with multiple other family  members. No one has power of attorney.  Decision making for patient's care has been shared  between the patient's son-in-law, daughter-in-law and niece. discussed goals of care with niece and family on 12/14.  They are well aware of the patient's guarded prognosis.   Patient is currently DNR Palliative care has been consulted and is following along, their assistance is appreciated.  Medications     amLODipine  10 mg Oral Daily   baclofen  10 mg Oral BID   clonazePAM  0.5 mg Oral BID   docusate sodium  100 mg Oral BID   enoxaparin (LOVENOX) injection  40 mg Subcutaneous Q24H   leptospermum manuka honey  1 Application Topical Daily   melatonin  10 mg Oral QHS   pantoprazole  40 mg Oral Daily   polyethylene glycol  17 g Oral BID   potassium chloride  40 mEq Oral Once   risperiDONE  0.5 mg Oral QHS   senna-docusate  1 tablet Oral BID     Data Reviewed:   CBG:  No results for input(s): "GLUCAP" in the last 168 hours.  SpO2: 97 % O2 Flow Rate (L/min): 2 L/min    Vitals:   10/26/22 1206 10/26/22 2057 10/27/22 0624 10/27/22 0919  BP: (!) 148/77 136/78 136/70 121/64  Pulse: 96 (!) 101 93   Resp: '20 18 18   '$ Temp: 99.1 F (37.3 C) 98.1 F (36.7 C) 97.9 F (36.6 C)   TempSrc: Oral Oral Oral   SpO2:  96% 97%   Weight:      Height:          Data Reviewed:  Basic Metabolic Panel: Recent Labs  Lab 10/21/22 0540 10/22/22 0534 10/23/22 0804 10/24/22 0558 10/25/22 0505 10/26/22 0420 10/27/22 0446  NA 140 138 141 135 138 137 138  K 3.7 3.6 3.7 3.5 3.2* 3.4* 3.9  CL 108 107 111 105 105 106 107  CO2 '25 24 24 23 25 25 23  '$ GLUCOSE 106* 107* 108* 128* 110* 115* 117*  BUN 6* 6* '9 9 10 12 13  '$ CREATININE 0.55* 0.55* 0.60* 0.60* 0.63 0.63 0.59*  CALCIUM 7.8* 8.0* 8.4* 8.1* 8.1* 8.1* 8.4*  MG 1.8 1.8 2.0 1.7 2.1  --   --   PHOS  --  3.2  --   --   --   --   --     CBC: Recent Labs  Lab 10/21/22 0540 10/22/22 0534 10/23/22 0804 10/24/22 0558 10/25/22 0505  WBC 13.4*  10.3 10.1 10.4 9.9  NEUTROABS 11.1* 7.9* 7.6 7.9* 7.0  HGB 11.0* 11.6* 11.9* 12.5* 12.2*  HCT 35.6* 37.1* 37.1* 39.0 38.8*  MCV 94.4 93.9 92.1 92.2 92.8  PLT 280 312 377 402* 401*    LFT Recent Labs  Lab 10/21/22 0540 10/22/22 0534 10/23/22 0804 10/24/22 0558 10/25/22 0505  AST 36 39 37 35 31  ALT 28 34 35 35 32  ALKPHOS 52 53 57 55 53  BILITOT 0.8 0.6 0.8 0.9 0.5  PROT 5.0* 5.1* 5.3* 5.5* 5.4*  ALBUMIN 2.6* 2.5* 2.7* 2.5* 2.8*     Antibiotics: Anti-infectives (From admission, onward)    Start     Dose/Rate Route Frequency Ordered Stop   10/20/22 0800  vancomycin (VANCOREADY) IVPB 750 mg/150 mL        750 mg 150 mL/hr over 60 Minutes Intravenous Every 12 hours 10/19/22 1936 10/24/22 2200   10/19/22 1800  vancomycin (VANCOREADY) IVPB 1500 mg/300 mL        1,500 mg 150 mL/hr over 120 Minutes Intravenous  Once 10/19/22 1653 10/19/22 2047  10/19/22 1800  ceFEPIme (MAXIPIME) 2 g in sodium chloride 0.9 % 100 mL IVPB        2 g 200 mL/hr over 30 Minutes Intravenous Every 8 hours 10/19/22 1653 10/24/22 0339   10/17/22 1815  metroNIDAZOLE (FLAGYL) IVPB 500 mg  Status:  Discontinued        500 mg 100 mL/hr over 60 Minutes Intravenous 2 times daily 10/17/22 1758 10/18/22 1135   10/16/22 2200  cefTRIAXone (ROCEPHIN) 2 g in sodium chloride 0.9 % 100 mL IVPB  Status:  Discontinued        2 g 200 mL/hr over 30 Minutes Intravenous Every 24 hours 10/16/22 1307 10/19/22 1636   10/16/22 2100  cefTRIAXone (ROCEPHIN) 1 g in sodium chloride 0.9 % 100 mL IVPB  Status:  Discontinued        1 g 200 mL/hr over 30 Minutes Intravenous Every 24 hours 10/15/22 2335 10/16/22 1307   10/15/22 2145  cefTRIAXone (ROCEPHIN) 2 g in sodium chloride 0.9 % 100 mL IVPB        2 g 200 mL/hr over 30 Minutes Intravenous  Once 10/15/22 2134 10/15/22 2223        DVT prophylaxis:   Code Status: DNR  Family Communication:    CONSULTS psychiatry   Objective    Physical  Examination:   General-appears in no acute distress Heart-S1-S2, regular, no murmur auscultated Lungs-clear to auscultation bilaterally, no wheezing or crackles auscultated Abdomen-soft, nontender, no organomegaly Extremities-no edema in the lower extremities Neuro-alert, oriented x3, no focal deficit noted   Status is: Inpatient:      Pressure Injury 10/16/22 Sacrum Anterior;Mid Unstageable - Full thickness tissue loss in which the base of the injury is covered by slough (yellow, tan, gray, green or brown) and/or eschar (tan, brown or black) in the wound bed. (Active)  10/16/22 0000  Location: Sacrum  Location Orientation: Anterior;Mid  Staging: Unstageable - Full thickness tissue loss in which the base of the injury is covered by slough (yellow, tan, gray, green or brown) and/or eschar (tan, brown or black) in the wound bed.  Wound Description (Comments):   Present on Admission: Yes     Pressure Injury 10/16/22 Heel Left;Anterior Stage 1 -  Intact skin with non-blanchable redness of a localized area usually over a bony prominence. (Active)  10/16/22 0000  Location: Heel  Location Orientation: Left;Anterior  Staging: Stage 1 -  Intact skin with non-blanchable redness of a localized area usually over a bony prominence.  Wound Description (Comments):   Present on Admission: Yes     Pressure Injury 10/24/22 Thigh Left;Posterior;Proximal Stage 2 -  Partial thickness loss of dermis presenting as a shallow open injury with a red, pink wound bed without slough. (Active)  10/24/22 1940  Location: Thigh  Location Orientation: Left;Posterior;Proximal  Staging: Stage 2 -  Partial thickness loss of dermis presenting as a shallow open injury with a red, pink wound bed without slough.  Wound Description (Comments):   Present on Admission:         [place]   [facility] If 7PM-7AM, please contact night-coverage at [email or web address], Office  [phone]   10/27/2022, 10:24  AM  LOS: 12 days

## 2022-10-27 NOTE — Consult Note (Signed)
[place] [facility]iat[facility] Consult   Reason for Consult: Intentional drug overdose/likely suicide attempt Referring Physician:  Shela Leff  Patient Identification: David Kim MRN:  [id number] Principal Diagnosis: Intentional overdose [facility]) Diagnosis:  Principal Problem:   Intentional overdose ([place]) Active Problems:   Severe recurrent major depression ([place])   Paraplegia ([place])   Essential hypertension   GERD   Toxic metabolic encephalopathy   Goals of care, counseling/discussion   Sepsis ([place])   Metabolic acidosis   Bacteremia due to Escherichia coli   Pressure injury of sacral region, stage 2 ([place])   Stercoral colitis   GIB (gastrointestinal bleeding)   Hypokalemia   Stercoral ulcer of rectum   Abnormal finding on GI tract imaging   Acute blood loss anemia   Chronic constipation   Total Time spent with patient: 15 minutes  Subjective:   David Kim is a 76 y.o. male was seen and evaluated face-to-face by this provider.  Chart review patient currently awaiting inpatient admission due to suicidal plan and intent.  He has a charted history with major depressive disorder, generalized anxiety disorder and suicide attempts.    Currently patient has expressed ongoing ruminations related to " someone breaking into my house" patient can be difficult to understand as he is soft-spoken.  However, he reports hearing water coming up through the vent at nights.  Patient's unable to recall the reason for this admission.  As reported by staff patient continues to experience hallucinations and delirium throughout the day. Continue with delirium precautions.   Psychiatry will continue to follow. Will increase Risperdal 0.5 mg nightly to 1 mg.  Orders have been placed to transitions of care for inpatient admission patient's currently under review at multiple facilities.  Support, encouragement and reassurance was provided.  HPI: per initial admission assessment note:  "David Kim is a  76 yo male with PMH paraplegia, severe MDD, persistent adjustment disorder with mixed anxiety and depressed mood ,PUD, gout, HTN, IBS, OSA, prostate cancer, GERD.  He was recently evaluated under IVC last month after being depressed and expressing suicidal thoughts.  He was evaluated by psychiatry and cleared for discharge home with decrease in his Klonopin dose.  Patient presented to [facility] long hospital emergency department after home health aide discovered patient took an unknown amount of pills."   Past Psychiatric History:   Risk to Self:   Risk to Others:   Prior Inpatient Therapy:   Prior Outpatient Therapy:    Past Medical History:  Past Medical History:  Diagnosis Date   Allergic rhinitis    ALLERGIC RHINITIS 10/01/2007   Qualifier: Diagnosis of  By: Jenny Reichmann MD, Hunt Oris    Anxiety    Carpal tunnel syndrome    right   Depression    Depression    Erectile dysfunction    Fatigue    GERD (gastroesophageal reflux disease)    Gout    Hypertension    HYPOGONADISM 03/31/2008   Qualifier: Diagnosis of  By: Jenny Reichmann MD, Hunt Oris    IBS (irritable bowel syndrome)    Low back pain    OSA (obstructive sleep apnea)    Paraplegia ([place]) 04/23/2009   Qualifier: Diagnosis of  By: Jenny Reichmann MD, Hunt Oris    Peptic ulcer disease    PEPTIC ULCER DISEASE 10/01/2007   Qualifier: Diagnosis of  By: Jenny Reichmann MD, Hunt Oris    Prostate cancer [facility])    PROSTATE CANCER, HX OF 05/23/2007   Qualifier: Diagnosis of  By: Jenny Reichmann MD,  [place] PARALYSIS 04/23/2009   Qualifier: Diagnosis of  By: Jenny Reichmann MD, Hunt Oris    Thoracic spinal cord injury [facility]) 03/12/2012    Past Surgical History:  Procedure Laterality Date   ARTERY AND TENDON REPAIR Left 09/06/2018   Procedure: ARTERY AND TENDON REPAIR;  Surgeon: Charlotte Crumb, MD;  Location: [place];  Service: Orthopedics;  Laterality: Left;   CAST APPLICATION Left 26/01/7857   Procedure: CAST APPLICATION;  Surgeon: Charlotte Crumb, MD;  Location: [place];  Service:  Orthopedics;  Laterality: Left;   inguinal heniorrhaphy     left leg surgery after fibula     NERVE REPAIR Left 09/06/2018   Procedure: NERVE REPAIR TIMES TWO;  Surgeon: Charlotte Crumb, MD;  Location: [place];  Service: Orthopedics;  Laterality: Left;   OPEN REDUCTION INTERNAL FIXATION (ORIF) DISTAL PHALANX Left 09/06/2018   Procedure: OPEN REDUCTION INTERNAL FIXATION (ORIF) LEFT LONG FINGER;  Surgeon: Charlotte Crumb, MD;  Location: [place];  Service: Orthopedics;  Laterality: Left;   PILONIDAL CYST / SINUS EXCISION     PROSTATECTOMY     tonsillectomy     WOUND EXPLORATION Left 09/06/2018   Procedure: EXPLORATION OFCOMPLEX INJURY;  Surgeon: Charlotte Crumb, MD;  Location: [place];  Service: Orthopedics;  Laterality: Left;   Family History:  Family History  Problem Relation Age of Onset   High blood pressure Mother    Dementia Mother    Diabetes Father    Family Psychiatric  History:  Social History:  Social History   Substance and Sexual Activity  Alcohol Use No     Social History   Substance and Sexual Activity  Drug Use No    Social History   Socioeconomic History   Marital status: Married    Spouse name: David Kim   Number of children: 0   Years of education: 12   Highest education level: Not on file  Occupational History   Occupation: retired    Fish farm manager: RETIRED  Tobacco Use   Smoking status: Never   Smokeless tobacco: Never  Vaping Use   Vaping Use: Never used  Substance and Sexual Activity   Alcohol use: No   Drug use: No   Sexual activity: Not on file  Other Topics Concern   Not on file  Social History Narrative   Patient lives at home with his wife David Kim). Patient is disabled. Patient has 12 th grade education.   Right handed.   Caffeine- None   Social Determinants of Health   Financial Resource Strain: Low Risk  (07/24/2022)   Overall Financial Resource Strain (CARDIA)    Difficulty of Paying Living Expenses: Not hard at all  Food Insecurity: No  Food Insecurity (10/20/2022)   Hunger Vital Sign    Worried About Running Out of Food in the Last Year: Never true    Ran Out of Food in the Last Year: Never true  Transportation Needs: No Transportation Needs (10/20/2022)   PRAPARE - Hydrologist (Medical): No    Lack of Transportation (Non-Medical): No  Physical Activity: Inactive (07/24/2022)   Exercise Vital Sign    Days of Exercise per Week: 0 days    Minutes of Exercise per Session: 0 min  Stress: No Stress Concern Present (07/24/2022)   [place]    Feeling of Stress : Not at all  Social Connections: Moderately Isolated (07/24/2022)   Social Connection and Isolation Panel [NHANES]  Frequency of Communication with Friends and Family: More than three times a week    Frequency of Social Gatherings with Friends and Family: More than three times a week    Attends Religious Services: Never    Marine scientist or Organizations: No    Attends Archivist Meetings: Never    Marital Status: Married   Additional Social History:    Allergies:   Allergies  Allergen Reactions   Amoxicillin Other (See Comments)    Unknown    Endal Hd Other (See Comments)    Unknown     Labs:  Results for orders placed or performed during the hospital encounter of 10/15/22 (from the past 48 hour(s))  Basic metabolic panel     Status: Abnormal   Collection Time: 10/26/22  4:20 AM  Result Value Ref Range   Sodium 137 135 - 145 mmol/L   Potassium 3.4 (L) 3.5 - 5.1 mmol/L   Chloride 106 98 - 111 mmol/L   CO2 25 22 - 32 mmol/L   Glucose, Bld 115 (H) 70 - 99 mg/dL    Comment: Glucose reference range applies only to samples taken after fasting for at least 8 hours.   BUN 12 8 - 23 mg/dL   Creatinine, Ser 0.63 0.61 - 1.24 mg/dL   Calcium 8.1 (L) 8.9 - 10.3 mg/dL   GFR, Estimated >60 >60 mL/min    Comment: (NOTE) Calculated using the CKD-EPI  Creatinine Equation (2021)    Anion gap 6 5 - 15    Comment: Performed at [facility], [place] [address], [place], [place] [address]  Basic metabolic panel     Status: Abnormal   Collection Time: 10/27/22  4:46 AM  Result Value Ref Range   Sodium 138 135 - 145 mmol/L   Potassium 3.9 3.5 - 5.1 mmol/L   Chloride 107 98 - 111 mmol/L   CO2 23 22 - 32 mmol/L   Glucose, Bld 117 (H) 70 - 99 mg/dL    Comment: Glucose reference range applies only to samples taken after fasting for at least 8 hours.   BUN 13 8 - 23 mg/dL   Creatinine, Ser 0.59 (L) 0.61 - 1.24 mg/dL   Calcium 8.4 (L) 8.9 - 10.3 mg/dL   GFR, Estimated >60 >60 mL/min    Comment: (NOTE) Calculated using the CKD-EPI Creatinine Equation (2021)    Anion gap 8 5 - 15    Comment: Performed at [facility], [place] [address], [place], [place] [address]    Current Facility-Administered Medications  Medication Dose Route Frequency Provider Last Rate Last Admin   0.9 %  sodium chloride infusion   Intravenous PRN Shalhoub, Sherryll Burger, MD       acetaminophen (TYLENOL) tablet 650 mg  650 mg Oral Q6H PRN Dwyane Dee, MD   650 mg at 10/17/22 4259   Or   acetaminophen (TYLENOL) suppository 650 mg  650 mg Rectal Q6H PRN Dwyane Dee, MD   650 mg at 10/17/22 1526   amLODipine (NORVASC) tablet 10 mg  10 mg Oral Daily Vernelle Emerald, MD   10 mg at 10/27/22 0919   baclofen (LIORESAL) tablet 10 mg  10 mg Oral BID Vernelle Emerald, MD   10 mg at 10/27/22 0920   clonazePAM (KLONOPIN) tablet 0.5 mg  0.5 mg Oral BID Vernelle Emerald, MD   0.5 mg at 10/27/22 0920   docusate sodium (COLACE) capsule 100 mg  100 mg  Oral BID Mansouraty, Telford Nab., MD   100 mg at 10/27/22 0920   enoxaparin (LOVENOX) injection 40 mg  40 mg Subcutaneous Q24H Dwyane Dee, MD   40 mg at 10/27/22 5009   haloperidol (HALDOL) tablet 2 mg  2 mg Oral BID PRN Cinderella, Margaret A       And   LORazepam (ATIVAN) tablet 1 mg  1 mg Oral  BID PRN Cinderella, Margaret A       hydrALAZINE (APRESOLINE) injection 10 mg  10 mg Intravenous Q6H PRN Shalhoub, Sherryll Burger, MD       leptospermum manuka honey (MEDIHONEY) paste 1 Application  1 Application Topical Daily Dwyane Dee, MD   1 Application at 38/18/29 9371   melatonin tablet 10 mg  10 mg Oral QHS Vernelle Emerald, MD   10 mg at 10/26/22 2138   Oral care mouth rinse  15 mL Mouth Rinse PRN Shalhoub, Sherryll Burger, MD       pantoprazole (PROTONIX) EC tablet 40 mg  40 mg Oral Daily Vernelle Emerald, MD   40 mg at 10/27/22 0919   polyethylene glycol (MIRALAX / GLYCOLAX) packet 17 g  17 g Oral BID [place], Amy S, PA-C   17 g at 10/27/22 6967   polyvinyl alcohol (LIQUIFILM TEARS) 1.4 % ophthalmic solution 1 drop  1 drop Both Eyes PRN Dwyane Dee, MD   1 drop at 10/17/22 2229   potassium chloride SA (KLOR-CON M) CR tablet 40 mEq  40 mEq Oral Once [place], MD       risperiDONE (RISPERDAL) tablet 0.5 mg  0.5 mg Oral QHS Cinderella, Margaret A   0.5 mg at 10/26/22 2138   senna-docusate (Senokot-S) tablet 1 tablet  1 tablet Oral BID Dwyane Dee, MD   1 tablet at 10/27/22 0920   tiZANidine (ZANAFLEX) tablet 4 mg  4 mg Oral Q6H PRN Vernelle Emerald, MD   4 mg at 10/25/22 0845    Musculoskeletal:  Psychiatric Specialty Exam:  Presentation  General Appearance:  Appropriate for Environment; Casual  Eye Contact: Good  Speech: Clear and Coherent; Normal Rate  Speech Volume: Normal  Handedness: Right   Mood and Affect  Mood: Depressed  Affect: Congruent   Thought Process  Thought Processes: Coherent; Linear  Descriptions of Associations:Intact  Orientation:Full (Time, Place and Person)  Thought Content:WDL  History of Schizophrenia/Schizoaffective disorder:No  Duration of Psychotic Symptoms:No data recorded Hallucinations:Hallucinations: None  Ideas of Reference:None  Suicidal Thoughts:Suicidal Thoughts: Yes, Passive SI Passive Intent and/or  Plan: Without Intent  Homicidal Thoughts:No data recorded  Sensorium  Memory: Immediate Fair; Remote Fair; Recent Fair; Immediate Poor  Judgment: Intact  Insight: Fair   Community education officer  Concentration: Good  Attention Span: Good  Recall: Good  Fund of Knowledge: Good  Language: Good   Psychomotor Activity  Psychomotor Activity: Psychomotor Activity: Normal   Assets  Assets: Communication Skills; Social Support; Catering manager   Sleep  Sleep: Sleep: Fair   Physical Exam: Physical Exam Vitals and nursing note reviewed.  Cardiovascular:     Rate and Rhythm: Normal rate and regular rhythm.  Neurological:     Mental Status: He is oriented to person, place, and time.  Psychiatric:        Mood and Affect: Mood normal.        Behavior: Behavior normal.        Thought Content: Thought content normal.    ROS Blood pressure 121/64, pulse 93, temperature 97.9 F (36.6  C), temperature source Oral, resp. rate 18, height '5\' 8"'$  (1.727 m), weight 85.7 kg, SpO2 97 %. Body mass index is 28.73 kg/m.  Treatment Plan Summary: Daily contact with patient to assess and evaluate symptoms and progress in treatment and Medication management  Increased Risperdal 0.5 mg to Risperdal 1 mg nightly Continue Klonopin 0.5 mg p.o. twice daily as needed See chart for agitation protocol with Haldol and Ativan.  Disposition: Recommend psychiatric Inpatient admission when medically cleared.  Derrill [place], NP 10/27/2022 10:54 AM

## 2022-10-27 NOTE — TOC Progression Note (Addendum)
Transition of Care [facility]) - Progression Note    Patient Details  Name: David Kim MRN: [id number] Date of Birth: 17-Dec-1945  Transition of Care [facility]) CM/SW Contact  Henrietta Dine, RN Phone Number: [phone] 10/27/2022, 9:48 AM  Clinical Narrative:    Notified by Sheran Fava, FNP pt needs voluntary psych-gero bed; referrals sent via secure chat to Ian Malkin, [place] and [place], [facility]; information also faxed out to [facility], [place], [place] [facility], [place], [place], [place], [place] [facility], [place], [place], [place], [place], [place], [place], and [employer]; documents sent: demographics, H&P, psych notes, progress notes, COVID test, and MAR; awaiting bed offers  0940: [place]- no 1100: [facility]- no  Expected Discharge Plan:  (TBD) Barriers to Discharge: Continued Medical Work up  Expected Discharge Plan and Services   Discharge Planning Services: CM Consult   Living arrangements for the past 2 months: Single Family Home                                       Social Determinants of Health (SDOH) Interventions SDOH Screenings   Food Insecurity: No Food Insecurity (10/20/2022)  Housing: Low Risk  (10/20/2022)  Transportation Needs: No Transportation Needs (10/20/2022)  Utilities: Not At Risk (10/20/2022)  Alcohol Screen: Low Risk  (07/24/2022)  Depression (PHQ2-9): Low Risk  (04/05/2022)  Financial Resource Strain: Low Risk  (07/24/2022)  Physical Activity: Inactive (07/24/2022)  Social Connections: Moderately Isolated (07/24/2022)  Stress: No Stress Concern Present (07/24/2022)  Tobacco Use: Low Risk  (10/20/2022)    Readmission Risk Interventions     No data to display

## 2022-10-28 DIAGNOSIS — T1491XA Suicide attempt, initial encounter: Secondary | ICD-10-CM | POA: Diagnosis not present

## 2022-10-28 DIAGNOSIS — N3000 Acute cystitis without hematuria: Secondary | ICD-10-CM | POA: Diagnosis not present

## 2022-10-28 DIAGNOSIS — G9340 Encephalopathy, unspecified: Secondary | ICD-10-CM | POA: Diagnosis not present

## 2022-10-28 DIAGNOSIS — G928 Other toxic encephalopathy: Secondary | ICD-10-CM | POA: Diagnosis not present

## 2022-10-28 DIAGNOSIS — I10 Essential (primary) hypertension: Secondary | ICD-10-CM | POA: Diagnosis not present

## 2022-10-28 DIAGNOSIS — E876 Hypokalemia: Secondary | ICD-10-CM | POA: Diagnosis not present

## 2022-10-28 NOTE — Progress Notes (Signed)
[facility]  PROGRESS NOTE  David Kim ZOX:[id number] DOB: 07/02/1946 DOA: 10/15/2022 PCP: Biagio Borg, MD   Brief HPI:   76 yo male with PMH paraplegia, severe MDD, persistent adjustment disorder with mixed anxiety and depressed mood ,PUD, gout, HTN, IBS, OSA, prostate cancer, GERD.  He was recently evaluated under IVC last month after being depressed and expressing suicidal ideation.  He was treated by psychiatry on their service and cleared for discharge home with decrease in his Klonopin dose on 11/13.  Patient presented to [facility] emergency department after home health aide discovered patient took an unknown amount of pills.   During the patient's emergency department evaluation, discussions with EMS revealed there were opened bottles of losartan, tizanidine, oxybutynin, baclofen, and clonazepam. Upon initial interview the patient endorsed at least taking clonazepam and tizanidine but would not provide additional information.  Initial evaluation also revealed multiple SIRS criteria with fever of 103.2 and UA suggestive of UTI prompting initiation of intravenous ceftriaxone.  Urine culture came back positive for E. coli which was pansensitive.  On 1214 also was found to have left lower lobe infiltrate on chest x-ray, antibiotic was changed to vancomycin and cefepime.   Subjective   Denies any complaints.  Denies hallucinations.    Assessment/Plan:    Intentional overdose -Suspected intentional overdose; found to have open bottles of losartan, tizanidine, oxybutynin, baclofen and clonazepam by EMS -Initially all the medications were held due to overdose -Then benzodiazepines were resumed as they were concerned for withdrawal which has been slowly tapered off since then -Consider IVC paperwork if patient attempts to elope -Continue one-to-one sitter for safety precaution -Psych is following; started on Risperdal for hallucinations with some  improvement   Sepsis -Secondary to E. coli UTI -Also found to have left lobe infiltrate on chest x-ray on 10/19/2022 -Antibiotics changed from ceftriaxone to cefepime and vancomycin -Antibiotics completed on 10/24/2022  Acute hypoxemic respiratory failure -Resolved  Metabolic encephalopathy -Continues to exhibit confusion, myoclonus and visual hallucinations -Psych as started on Risperdal today -Likely in setting of infection, drug overdose -MRI brain unremarkable -Recent TSH and B12 unremarkable -EEG on 10/20/2022 showed no obvious seizure activity  GI bleed -Episode of bright red blood per rectum on 12/12 -Patient is s/p 2 units PRBC on 12/12 -GI was consulted and they suspect etiology likely from stercoral colitis and ulceration -No evidence of GI bleed since then -H&H has been stable  Bacteremia due to E. Coli -Antibiotics course completed on 12/19  Stercoral colitis -History of severe constipation -In the past, patient's wife was primarily involved in performing regular rectal stimulation and disimpactions prior to her death -Continue MiraLAX, Colace, senna; patient moving his bowels regularly -CT imaging was suggestive of stercoral colitis -Patient has history of paraplegia, likely has neurogenic bowel -Continue disimpaction as needed  Severe recurrent major depression -Psych following -As per patient's niece; .  Patient has become severely depressed since his wife passed away in 08/23/2023.  She was essentially the patient's sole caretaker and he was completely dependent on her.  Since her passing, he has began "shutting down   Hypertension -Continue amlodipine  Paraplegia -Frequent turning to minimize element of pressure ulceration -Fall precautions  Hypokalemia -Potassium is 3.4 -Replace potassium and follow BMP in am   Goals of care, counseling/discussion Goals of care discussion 12/12 with [name] patient's niece along with multiple other family  members. No one has power of attorney.  Decision making for patient's care has been shared  between the patient's son-in-law, daughter-in-law and niece. discussed goals of care with niece and family on 12/14.  They are well aware of the patient's guarded prognosis.   Patient is currently DNR Palliative care has been consulted and is following along, their assistance is appreciated.  Medications     amLODipine  10 mg Oral Daily   baclofen  10 mg Oral BID   clonazePAM  0.5 mg Oral BID   docusate sodium  100 mg Oral BID   enoxaparin (LOVENOX) injection  40 mg Subcutaneous Q24H   leptospermum manuka honey  1 Application Topical Daily   melatonin  10 mg Oral QHS   pantoprazole  40 mg Oral Daily   polyethylene glycol  17 g Oral BID   potassium chloride  40 mEq Oral Once   risperiDONE  1 mg Oral QHS   senna-docusate  1 tablet Oral BID     Data Reviewed:   CBG:  No results for input(s): "GLUCAP" in the last 168 hours.  SpO2: 97 % O2 Flow Rate (L/min): 2 L/min    Vitals:   10/27/22 0919 10/27/22 1314 10/27/22 2214 10/28/22 0628  BP: 121/64 122/65 136/71 134/72  Pulse:  93 91 92  Resp:  '18 20 18  '$ Temp:  98.8 F (37.1 C) 98.7 F (37.1 C) 97.8 F (36.6 C)  TempSrc:  Axillary    SpO2:  97% 98% 97%  Weight:      Height:          Data Reviewed:  Basic Metabolic Panel: Recent Labs  Lab 10/22/22 0534 10/23/22 0804 10/24/22 0558 10/25/22 0505 10/26/22 0420 10/27/22 0446  NA 138 141 135 138 137 138  K 3.6 3.7 3.5 3.2* 3.4* 3.9  CL 107 111 105 105 106 107  CO2 '24 24 23 25 25 23  '$ GLUCOSE 107* 108* 128* 110* 115* 117*  BUN 6* '9 9 10 12 13  '$ CREATININE 0.55* 0.60* 0.60* 0.63 0.63 0.59*  CALCIUM 8.0* 8.4* 8.1* 8.1* 8.1* 8.4*  MG 1.8 2.0 1.7 2.1  --   --   PHOS 3.2  --   --   --   --   --     CBC: Recent Labs  Lab 10/22/22 0534 10/23/22 0804 10/24/22 0558 10/25/22 0505  WBC 10.3 10.1 10.4 9.9  NEUTROABS 7.9* 7.6 7.9* 7.0  HGB 11.6* 11.9* 12.5* 12.2*  HCT 37.1*  37.1* 39.0 38.8*  MCV 93.9 92.1 92.2 92.8  PLT 312 377 402* 401*    LFT Recent Labs  Lab 10/22/22 0534 10/23/22 0804 10/24/22 0558 10/25/22 0505  AST 39 37 35 31  ALT 34 35 35 32  ALKPHOS 53 57 55 53  BILITOT 0.6 0.8 0.9 0.5  PROT 5.1* 5.3* 5.5* 5.4*  ALBUMIN 2.5* 2.7* 2.5* 2.8*     Antibiotics: Anti-infectives (From admission, onward)    Start     Dose/Rate Route Frequency Ordered Stop   10/20/22 0800  vancomycin (VANCOREADY) IVPB 750 mg/150 mL        750 mg 150 mL/hr over 60 Minutes Intravenous Every 12 hours 10/19/22 1936 10/24/22 2200   10/19/22 1800  vancomycin (VANCOREADY) IVPB 1500 mg/300 mL        1,500 mg 150 mL/hr over 120 Minutes Intravenous  Once 10/19/22 1653 10/19/22 2047   10/19/22 1800  ceFEPIme (MAXIPIME) 2 g in sodium chloride 0.9 % 100 mL IVPB        2 g 200 mL/hr over 30 Minutes Intravenous Every  8 hours 10/19/22 1653 10/24/22 0339   10/17/22 1815  metroNIDAZOLE (FLAGYL) IVPB 500 mg  Status:  Discontinued        500 mg 100 mL/hr over 60 Minutes Intravenous 2 times daily 10/17/22 1758 10/18/22 1135   10/16/22 2200  cefTRIAXone (ROCEPHIN) 2 g in sodium chloride 0.9 % 100 mL IVPB  Status:  Discontinued        2 g 200 mL/hr over 30 Minutes Intravenous Every 24 hours 10/16/22 1307 10/19/22 1636   10/16/22 2100  cefTRIAXone (ROCEPHIN) 1 g in sodium chloride 0.9 % 100 mL IVPB  Status:  Discontinued        1 g 200 mL/hr over 30 Minutes Intravenous Every 24 hours 10/15/22 2335 10/16/22 1307   10/15/22 2145  cefTRIAXone (ROCEPHIN) 2 g in sodium chloride 0.9 % 100 mL IVPB        2 g 200 mL/hr over 30 Minutes Intravenous  Once 10/15/22 2134 10/15/22 2223        DVT prophylaxis:   Code Status: DNR  Family Communication:    CONSULTS psychiatry   Objective    Physical Examination:   Appears in no acute distress S1-S2, regular, no murmur auscultated Alert, oriented x 3, no hallucinations   Status is: Inpatient:      Pressure Injury  10/16/22 Sacrum Anterior;Mid Unstageable - Full thickness tissue loss in which the base of the injury is covered by slough (yellow, tan, gray, green or brown) and/or eschar (tan, brown or black) in the wound bed. (Active)  10/16/22 0000  Location: Sacrum  Location Orientation: Anterior;Mid  Staging: Unstageable - Full thickness tissue loss in which the base of the injury is covered by slough (yellow, tan, gray, green or brown) and/or eschar (tan, brown or black) in the wound bed.  Wound Description (Comments):   Present on Admission: Yes     Pressure Injury 10/16/22 Heel Left;Anterior Stage 1 -  Intact skin with non-blanchable redness of a localized area usually over a bony prominence. (Active)  10/16/22 0000  Location: Heel  Location Orientation: Left;Anterior  Staging: Stage 1 -  Intact skin with non-blanchable redness of a localized area usually over a bony prominence.  Wound Description (Comments):   Present on Admission: Yes     Pressure Injury 10/24/22 Thigh Left;Posterior;Proximal Stage 2 -  Partial thickness loss of dermis presenting as a shallow open injury with a red, pink wound bed without slough. (Active)  10/24/22 1940  Location: Thigh  Location Orientation: Left;Posterior;Proximal  Staging: Stage 2 -  Partial thickness loss of dermis presenting as a shallow open injury with a red, pink wound bed without slough.  Wound Description (Comments):   Present on Admission:         [place]   [facility] If 7PM-7AM, please contact night-coverage at [email or web address], Office  [phone]   10/28/2022, 10:20 AM  LOS: 13 days

## 2022-10-28 NOTE — Consult Note (Signed)
[facility] Face-to-Face Psychiatry Consult   Reason for Consult:  suicidal attempt Referring Physician:  Dr. Cyd Silence Patient Identification: David Kim MRN:  [id number] Principal Diagnosis: Intentional overdose [facility]) Diagnosis:  Principal Problem:   Intentional overdose ([place]) Active Problems:   Severe recurrent major depression ([place])   Paraplegia ([place])   Essential hypertension   GERD   Toxic metabolic encephalopathy   Goals of care, counseling/discussion   Sepsis ([place])   Metabolic acidosis   Bacteremia due to Escherichia coli   Pressure injury of sacral region, stage 2 ([place])   Stercoral colitis   GIB (gastrointestinal bleeding)   Hypokalemia   Stercoral ulcer of rectum   Abnormal finding on GI tract imaging   Acute blood loss anemia   Chronic constipation   Total Time spent with patient: 45 minutes  Subjective:   David Kim is a 76 y.o. male patient admitted after suicide attempt on multiple sedative/hypnotic type medications.   HPI: Patient is alert, oriented in self, place, month, year. He reports good mood and denies feeling depressed, denies suicidal thoughts, plans. He reports he slept well and his appetite is good. He denies auditory or visual hallucinations. When asked about people he believed entered his house while he is here, he confirmed that still believes in that "they are thieves, I don`t know what exactly they took, but they were there".  Patient`s sitter reports that the patient  is mostly oriented today, able to participate in conversation, does not appear internally-stimulated.    Past Medical History:  Past Medical History:  Diagnosis Date   Allergic rhinitis    ALLERGIC RHINITIS 10/01/2007   Qualifier: Diagnosis of  By: Jenny Reichmann MD, Hunt Oris    Anxiety    Carpal tunnel syndrome    right   Depression    Depression    Erectile dysfunction    Fatigue    GERD (gastroesophageal reflux disease)    Gout    Hypertension    HYPOGONADISM 03/31/2008    Qualifier: Diagnosis of  By: Jenny Reichmann MD, Hunt Oris    IBS (irritable bowel syndrome)    Low back pain    OSA (obstructive sleep apnea)    Paraplegia ([place]) 04/23/2009   Qualifier: Diagnosis of  By: Jenny Reichmann MD, Hunt Oris    Peptic ulcer disease    PEPTIC ULCER DISEASE 10/01/2007   Qualifier: Diagnosis of  By: Jenny Reichmann MD, Hunt Oris    Prostate cancer [facility])    PROSTATE CANCER, HX OF 05/23/2007   Qualifier: Diagnosis of  By: Jenny Reichmann MD, [place] PARALYSIS 04/23/2009   Qualifier: Diagnosis of  By: Jenny Reichmann MD, Hunt Oris    Thoracic spinal cord injury ([place]) 03/12/2012    Past Surgical History:  Procedure Laterality Date   ARTERY AND TENDON REPAIR Left 09/06/2018   Procedure: ARTERY AND TENDON REPAIR;  Surgeon: Charlotte Crumb, MD;  Location: [place];  Service: Orthopedics;  Laterality: Left;   CAST APPLICATION Left 78/03/8849   Procedure: CAST APPLICATION;  Surgeon: Charlotte Crumb, MD;  Location: [place];  Service: Orthopedics;  Laterality: Left;   inguinal heniorrhaphy     left leg surgery after fibula     NERVE REPAIR Left 09/06/2018   Procedure: NERVE REPAIR TIMES TWO;  Surgeon: Charlotte Crumb, MD;  Location: [place];  Service: Orthopedics;  Laterality: Left;   OPEN REDUCTION INTERNAL FIXATION (ORIF) DISTAL PHALANX Left 09/06/2018   Procedure: OPEN REDUCTION INTERNAL FIXATION (ORIF) LEFT LONG FINGER;  Surgeon: Charlotte Crumb,  MD;  Location: [place];  Service: Orthopedics;  Laterality: Left;   PILONIDAL CYST / SINUS EXCISION     PROSTATECTOMY     tonsillectomy     WOUND EXPLORATION Left 09/06/2018   Procedure: EXPLORATION OFCOMPLEX INJURY;  Surgeon: Charlotte Crumb, MD;  Location: [place];  Service: Orthopedics;  Laterality: Left;   Family History:  Family History  Problem Relation Age of Onset   High blood pressure Mother    Dementia Mother    Diabetes Father     Social History:  Social History   Substance and Sexual Activity  Alcohol Use No     Social History   Substance and Sexual Activity   Drug Use No    Social History   Socioeconomic History   Marital status: Married    Spouse name: [name]   Number of children: 0   Years of education: 12   Highest education level: Not on file  Occupational History   Occupation: retired    Fish farm manager: RETIRED  Tobacco Use   Smoking status: Never   Smokeless tobacco: Never  Vaping Use   Vaping Use: Never used  Substance and Sexual Activity   Alcohol use: No   Drug use: No   Sexual activity: Not on file  Other Topics Concern   Not on file  Social History Narrative   Patient lives at home with [name]). Patient is disabled. Patient has 12 th grade education.   Right handed.   Caffeine- None   Social Determinants of Health   Financial Resource Strain: Low Risk  (07/24/2022)   Overall Financial Resource Strain (CARDIA)    Difficulty of Paying Living Expenses: Not hard at all  Food Insecurity: No Food Insecurity (10/20/2022)   Hunger Vital Sign    Worried About Running Out of Food in the Last Year: Never true    Ran Out of Food in the Last Year: Never true  Transportation Needs: No Transportation Needs (10/20/2022)   PRAPARE - Hydrologist (Medical): No    Lack of Transportation (Non-Medical): No  Physical Activity: Inactive (07/24/2022)   Exercise Vital Sign    Days of Exercise per Week: 0 days    Minutes of Exercise per Session: 0 min  Stress: No Stress Concern Present (07/24/2022)   [place]    Feeling of Stress : Not at all  Social Connections: Moderately Isolated (07/24/2022)   Social Connection and Isolation Panel [NHANES]    Frequency of Communication with Friends and Family: More than three times a week    Frequency of Social Gatherings with Friends and Family: More than three times a week    Attends Religious Services: Never    Marine scientist or Organizations: No    Attends Archivist Meetings:  Never    Marital Status: Married   Additional Social History:    Allergies:   Allergies  Allergen Reactions   Amoxicillin Other (See Comments)    Unknown    Endal Hd Other (See Comments)    Unknown     Labs:  Results for orders placed or performed during the hospital encounter of 10/15/22 (from the past 48 hour(s))  Basic metabolic panel     Status: Abnormal   Collection Time: 10/27/22  4:46 AM  Result Value Ref Range   Sodium 138 135 - 145 mmol/L   Potassium 3.9 3.5 - 5.1 mmol/L   Chloride  107 98 - 111 mmol/L   CO2 23 22 - 32 mmol/L   Glucose, Bld 117 (H) 70 - 99 mg/dL    Comment: Glucose reference range applies only to samples taken after fasting for at least 8 hours.   BUN 13 8 - 23 mg/dL   Creatinine, Ser 0.59 (L) 0.61 - 1.24 mg/dL   Calcium 8.4 (L) 8.9 - 10.3 mg/dL   GFR, Estimated >60 >60 mL/min    Comment: (NOTE) Calculated using the CKD-EPI Creatinine Equation (2021)    Anion gap 8 5 - 15    Comment: Performed at [facility], [place] [address], [place], [place] [address]    Current Facility-Administered Medications  Medication Dose Route Frequency Provider Last Rate Last Admin   0.9 %  sodium chloride infusion   Intravenous PRN Shalhoub, Sherryll Burger, MD       acetaminophen (TYLENOL) tablet 650 mg  650 mg Oral Q6H PRN Dwyane Dee, MD   650 mg at 10/17/22 5329   Or   acetaminophen (TYLENOL) suppository 650 mg  650 mg Rectal Q6H PRN Dwyane Dee, MD   650 mg at 10/17/22 1526   amLODipine (NORVASC) tablet 10 mg  10 mg Oral Daily Shalhoub, Sherryll Burger, MD   10 mg at 10/28/22 1124   baclofen (LIORESAL) tablet 10 mg  10 mg Oral BID Vernelle Emerald, MD   10 mg at 10/28/22 1126   clonazePAM (KLONOPIN) tablet 0.5 mg  0.5 mg Oral BID Vernelle Emerald, MD   0.5 mg at 10/28/22 1127   docusate sodium (COLACE) capsule 100 mg  100 mg Oral BID Mansouraty, Telford Nab., MD   100 mg at 10/28/22 1126   enoxaparin (LOVENOX) injection 40 mg  40 mg Subcutaneous  Q24H Dwyane Dee, MD   40 mg at 10/28/22 1129   haloperidol (HALDOL) tablet 2 mg  2 mg Oral BID PRN Cinderella, Margaret A       And   LORazepam (ATIVAN) tablet 1 mg  1 mg Oral BID PRN Cinderella, Margaret A       hydrALAZINE (APRESOLINE) injection 10 mg  10 mg Intravenous Q6H PRN Shalhoub, Sherryll Burger, MD       leptospermum manuka honey (Early) paste 1 Application  1 Application Topical Daily Dwyane Dee, MD   1 Application at 92/42/68 1130   melatonin tablet 10 mg  10 mg Oral QHS Vernelle Emerald, MD   10 mg at 10/27/22 2308   Oral care mouth rinse  15 mL Mouth Rinse PRN Shalhoub, Sherryll Burger, MD       pantoprazole (PROTONIX) EC tablet 40 mg  40 mg Oral Daily Shalhoub, Sherryll Burger, MD   40 mg at 10/28/22 1124   polyethylene glycol (MIRALAX / GLYCOLAX) packet 17 g  17 g Oral BID [place], Amy S, PA-C   17 g at 10/28/22 1124   polyvinyl alcohol (LIQUIFILM TEARS) 1.4 % ophthalmic solution 1 drop  1 drop Both Eyes PRN Dwyane Dee, MD   1 drop at 10/17/22 2229   potassium chloride SA (KLOR-CON M) CR tablet 40 mEq  40 mEq Oral Once [place], Marge Duncans, MD       risperiDONE (RISPERDAL) tablet 1 mg  1 mg Oral QHS Derrill Center, NP   1 mg at 10/27/22 2309   senna-docusate (Senokot-S) tablet 1 tablet  1 tablet Oral BID Dwyane Dee, MD   1 tablet at 10/28/22 1124   tiZANidine (ZANAFLEX) tablet 4 mg  4 mg Oral Q6H PRN Vernelle Emerald, MD   4 mg at 10/25/22 0845    Musculoskeletal: Strength & Muscle Tone: decreased Gait & Station: unable to stand Patient leans: N/A   Psychiatric Specialty Exam:   Appearance:  CM, wearing hospital clothes, with ok grooming and hygiene. Appropriate level of alertness .  Attitude/Behavior: engaging with no eye contact.  Motor: WNL; dyskinesias not evident.   Speech: clear, coherent  Mood: appears euthymic  Affect: blunted  Thought process: patient appears still disorganized and illogical, associations are not appropriate.  Thought content: patient  appears delusional, he denies suicidal thoughts, denies homicidal thoughts.  Thought perception: patient possible experiences visual hallucinations.   Cognition: patient is alert and partially-oriented in self, place, month, not in situation; with impaired attention and concentration.  Insight: poor  Judgement: poor   Physical Exam: Physical Exam ROS Blood pressure 128/69, pulse 94, temperature 98.1 F (36.7 C), temperature source Oral, resp. rate 18, height '5\' 8"'$  (1.727 m), weight 85.7 kg, SpO2 98 %. Body mass index is 28.73 kg/m.  Treatment Plan Summary: Daily contact with patient to assess and evaluate symptoms and progress in treatment and Medication management  Assessment and Plan: Patient is a 76 y.o. male patient admitted after suicide attempt on multiple sedative/hypnotic type medications.   Patient presents with waxing and waning confusion in a day, altered sensorium, impaired attention, disorientation and cognitive deficits that appear markedly different than their baseline, suggesting a diagnosis of delirium.   As with all hospitalized patients, would recommend delirium precautions: Minimize use of benzodiazepines, anticholinergics, and opiates, to the extent possible, as these can worsen mentation. Utilize non-pharmacological interventions including frequent reorientation, maintaining day/night distinction (blinds closed during night, open during day), minimizing excessive stimulation, staff continuity, reorient the patient frequently, provide easily visible clock and calendar, provide sensory aids like glasses, hearing aids, encourage ambulation, regular activities and visitors to maintain cognitive stimulation.  Continue Risperdal 1 mg nightly Continue Klonopin 0.5 mg p.o. twice daily as needed See chart for agitation protocol with Haldol and Ativan.   Disposition: Recommend psychiatric Inpatient admission when medically cleared.  Larita Fife, MD 10/28/2022 3:26  PM

## 2022-10-28 NOTE — Plan of Care (Signed)
  Problem: Coping: Goal: Level of anxiety will decrease Outcome: Progressing   Problem: Elimination: Goal: Will not experience complications related to bowel motility Outcome: Progressing Goal: Will not experience complications related to urinary retention Outcome: Progressing   Problem: Pain Managment: Goal: General experience of comfort will improve Outcome: Progressing   Problem: Safety: Goal: Ability to remain free from injury will improve Outcome: Progressing   Problem: Skin Integrity: Goal: Risk for impaired skin integrity will decrease Outcome: Progressing   Problem: Safety: Goal: Non-violent Restraint(s) Outcome: Completed/Met

## 2022-10-28 NOTE — Plan of Care (Signed)
  Problem: Clinical Measurements: Goal: Diagnostic test results will improve Outcome: Progressing Goal: Respiratory complications will improve Outcome: Progressing   Problem: Nutrition: Goal: Adequate nutrition will be maintained Outcome: Progressing   Problem: Safety: Goal: Ability to remain free from injury will improve Outcome: Progressing

## 2022-10-29 DIAGNOSIS — N3000 Acute cystitis without hematuria: Secondary | ICD-10-CM | POA: Diagnosis not present

## 2022-10-29 DIAGNOSIS — I10 Essential (primary) hypertension: Secondary | ICD-10-CM | POA: Diagnosis not present

## 2022-10-29 DIAGNOSIS — T1491XA Suicide attempt, initial encounter: Secondary | ICD-10-CM | POA: Diagnosis not present

## 2022-10-29 DIAGNOSIS — G928 Other toxic encephalopathy: Secondary | ICD-10-CM | POA: Diagnosis not present

## 2022-10-29 DIAGNOSIS — E876 Hypokalemia: Secondary | ICD-10-CM | POA: Diagnosis not present

## 2022-10-29 DIAGNOSIS — G9340 Encephalopathy, unspecified: Secondary | ICD-10-CM | POA: Diagnosis not present

## 2022-10-29 NOTE — Progress Notes (Signed)
Per Dr. Larita Fife, patient meets criteria for inpatient treatment. There are currently no available beds at [facility] today. CSW faxed referrals to the following facilities for review:  [facility] [address] [place] [address] [phone] --  [place] [address], [place] [address] [phone] --  [facility]  Pending - Request Sent N/A [address], [place] [address] [place] [address], [place] [address] [phone] --  [facility] [address], [place] [address] [phone] --  [facility] N/A [address], [place] [address] [phone] --  [place], [place] [address] [phone] --   TTS will continue to seek bed placement.  Glennie Isle, MSW, [place] [place] Phone: [phone] Disposition/TOC

## 2022-10-29 NOTE — Consult Note (Signed)
[facility] Face-to-Face Psychiatry Consult   Reason for Consult:  suicidal attempt Referring Physician:  Dr. Cyd Silence Patient Identification: David Kim MRN:  [id number] Principal Diagnosis: Intentional overdose [facility]) Diagnosis:  Principal Problem:   Intentional overdose ([place]) Active Problems:   Severe recurrent major depression ([place])   Paraplegia ([place])   Essential hypertension   GERD   Toxic metabolic encephalopathy   Goals of care, counseling/discussion   Sepsis ([place])   Metabolic acidosis   Bacteremia due to Escherichia coli   Pressure injury of sacral region, stage 2 ([place])   Stercoral colitis   GIB (gastrointestinal bleeding)   Hypokalemia   Stercoral ulcer of rectum   Abnormal finding on GI tract imaging   Acute blood loss anemia   Chronic constipation   Total Time spent with patient: 45 minutes  Subjective:   David Kim is a 76 y.o. male patient admitted after suicide attempt on multiple sedative/hypnotic type medications.   HPI: Patient seen in his room, watching TV. Sitter is at the bedside. Patient is alert, oriented in self, place, month, year. He reports "I am alright" and denies feeling depressed, denies suicidal thoughts, plans. He says he is looking for David Kim movie on TV today. He reports he slept well and his appetite is good. He denies auditory or visual hallucinations. He is not expressing any delusions during the assessment today.  Patient`s sitter reports that the patient  is alert and oriented today, watching Christmas movies on TV.    Past Medical History:  Past Medical History:  Diagnosis Date   Allergic rhinitis    ALLERGIC RHINITIS 10/01/2007   Qualifier: Diagnosis of  By: Jenny Reichmann MD, Hunt Oris    Anxiety    Carpal tunnel syndrome    right   Depression    Depression    Erectile dysfunction    Fatigue    GERD (gastroesophageal reflux disease)    Gout    Hypertension    HYPOGONADISM 03/31/2008   Qualifier: Diagnosis of  By: Jenny Reichmann MD,  Hunt Oris    IBS (irritable bowel syndrome)    Low back pain    OSA (obstructive sleep apnea)    Paraplegia ([place]) 04/23/2009   Qualifier: Diagnosis of  By: Jenny Reichmann MD, Hunt Oris    Peptic ulcer disease    PEPTIC ULCER DISEASE 10/01/2007   Qualifier: Diagnosis of  By: Jenny Reichmann MD, Hunt Oris    Prostate cancer [facility])    PROSTATE CANCER, HX OF 05/23/2007   Qualifier: Diagnosis of  By: Jenny Reichmann MD, [place] PARALYSIS 04/23/2009   Qualifier: Diagnosis of  By: Jenny Reichmann MD, Hunt Oris    Thoracic spinal cord injury ([place]) 03/12/2012    Past Surgical History:  Procedure Laterality Date   ARTERY AND TENDON REPAIR Left 09/06/2018   Procedure: ARTERY AND TENDON REPAIR;  Surgeon: Charlotte Crumb, MD;  Location: [place];  Service: Orthopedics;  Laterality: Left;   CAST APPLICATION Left 98/01/3824   Procedure: CAST APPLICATION;  Surgeon: Charlotte Crumb, MD;  Location: [place];  Service: Orthopedics;  Laterality: Left;   inguinal heniorrhaphy     left leg surgery after fibula     NERVE REPAIR Left 09/06/2018   Procedure: NERVE REPAIR TIMES TWO;  Surgeon: Charlotte Crumb, MD;  Location: [place];  Service: Orthopedics;  Laterality: Left;   OPEN REDUCTION INTERNAL FIXATION (ORIF) DISTAL PHALANX Left 09/06/2018   Procedure: OPEN REDUCTION INTERNAL FIXATION (ORIF) LEFT LONG FINGER;  Surgeon: Charlotte Crumb, MD;  Location: [place];  Service: Orthopedics;  Laterality: Left;   PILONIDAL CYST / SINUS EXCISION     PROSTATECTOMY     tonsillectomy     WOUND EXPLORATION Left 09/06/2018   Procedure: EXPLORATION OFCOMPLEX INJURY;  Surgeon: Charlotte Crumb, MD;  Location: [place];  Service: Orthopedics;  Laterality: Left;   Family History:  Family History  Problem Relation Age of Onset   High blood pressure Mother    Dementia Mother    Diabetes Father     Social History:  Social History   Substance and Sexual Activity  Alcohol Use No     Social History   Substance and Sexual Activity  Drug Use No    Social History    Socioeconomic History   Marital status: Married    Spouse name: [name]   Number of children: 0   Years of education: 12   Highest education level: Not on file  Occupational History   Occupation: retired    Fish farm manager: RETIRED  Tobacco Use   Smoking status: Never   Smokeless tobacco: Never  Vaping Use   Vaping Use: Never used  Substance and Sexual Activity   Alcohol use: No   Drug use: No   Sexual activity: Not on file  Other Topics Concern   Not on file  Social History Narrative   Patient lives at home with [name]). Patient is disabled. Patient has 12 th grade education.   Right handed.   Caffeine- None   Social Determinants of Health   Financial Resource Strain: Low Risk  (07/24/2022)   Overall Financial Resource Strain (CARDIA)    Difficulty of Paying Living Expenses: Not hard at all  Food Insecurity: No Food Insecurity (10/20/2022)   Hunger Vital Sign    Worried About Running Out of Food in the Last Year: Never true    Ran Out of Food in the Last Year: Never true  Transportation Needs: No Transportation Needs (10/20/2022)   PRAPARE - Hydrologist (Medical): No    Lack of Transportation (Non-Medical): No  Physical Activity: Inactive (07/24/2022)   Exercise Vital Sign    Days of Exercise per Week: 0 days    Minutes of Exercise per Session: 0 min  Stress: No Stress Concern Present (07/24/2022)   [place]    Feeling of Stress : Not at all  Social Connections: Moderately Isolated (07/24/2022)   Social Connection and Isolation Panel [NHANES]    Frequency of Communication with Friends and Family: More than three times a week    Frequency of Social Gatherings with Friends and Family: More than three times a week    Attends Religious Services: Never    Marine scientist or Organizations: No    Attends Archivist Meetings: Never    Marital Status: Married    Additional Social History:    Allergies:   Allergies  Allergen Reactions   Amoxicillin Other (See Comments)    Unknown    Endal Hd Other (See Comments)    Unknown     Labs:  No results found for this or any previous visit (from the past 48 hour(s)).   Current Facility-Administered Medications  Medication Dose Route Frequency Provider Last Rate Last Admin   0.9 %  sodium chloride infusion   Intravenous PRN Shalhoub, Sherryll Burger, MD       acetaminophen (TYLENOL) tablet 650 mg  650 mg Oral Q6H  PRN Dwyane Dee, MD   650 mg at 10/17/22 4098   Or   acetaminophen (TYLENOL) suppository 650 mg  650 mg Rectal Q6H PRN Dwyane Dee, MD   650 mg at 10/17/22 1526   amLODipine (NORVASC) tablet 10 mg  10 mg Oral Daily Shalhoub, Sherryll Burger, MD   10 mg at 10/29/22 1201   baclofen (LIORESAL) tablet 10 mg  10 mg Oral BID Vernelle Emerald, MD   10 mg at 10/29/22 1200   clonazePAM (KLONOPIN) tablet 0.5 mg  0.5 mg Oral BID Vernelle Emerald, MD   0.5 mg at 10/29/22 1200   docusate sodium (COLACE) capsule 100 mg  100 mg Oral BID Mansouraty, Telford Nab., MD   100 mg at 10/29/22 1201   enoxaparin (LOVENOX) injection 40 mg  40 mg Subcutaneous Q24H Dwyane Dee, MD   40 mg at 10/29/22 1203   haloperidol (HALDOL) tablet 2 mg  2 mg Oral BID PRN Cinderella, Margaret A       And   LORazepam (ATIVAN) tablet 1 mg  1 mg Oral BID PRN Cinderella, Margaret A       hydrALAZINE (APRESOLINE) injection 10 mg  10 mg Intravenous Q6H PRN Shalhoub, Sherryll Burger, MD       leptospermum manuka honey ([place]) paste 1 Application  1 Application Topical Daily Dwyane Dee, MD   1 Application at 11/91/47 1218   melatonin tablet 10 mg  10 mg Oral QHS Vernelle Emerald, MD   10 mg at 10/28/22 2159   Oral care mouth rinse  15 mL Mouth Rinse PRN Shalhoub, Sherryll Burger, MD       pantoprazole (PROTONIX) EC tablet 40 mg  40 mg Oral Daily Shalhoub, Sherryll Burger, MD   40 mg at 10/29/22 1200   polyethylene glycol (MIRALAX / GLYCOLAX) packet  17 g  17 g Oral BID E[place]wood, Amy S, PA-C   17 g at 10/29/22 1157   polyvinyl alcohol (LIQUIFILM TEARS) 1.4 % ophthalmic solution 1 drop  1 drop Both Eyes PRN Dwyane Dee, MD   1 drop at 10/17/22 2229   potassium chloride SA (KLOR-CON M) CR tablet 40 mEq  40 mEq Oral Once [place], Marge Duncans, MD       risperiDONE (RISPERDAL) tablet 1 mg  1 mg Oral QHS Derrill [place], NP   1 mg at 10/28/22 2159   senna-docusate (Senokot-S) tablet 1 tablet  1 tablet Oral BID Dwyane Dee, MD   1 tablet at 10/29/22 1200   tiZANidine (ZANAFLEX) tablet 4 mg  4 mg Oral Q6H PRN Vernelle Emerald, MD   4 mg at 10/25/22 0845    Musculoskeletal: Strength & Muscle Tone: decreased Gait & Station: unable to stand Patient leans: N/A   Psychiatric Specialty Exam:   Appearance:  CM, wearing hospital clothes, with ok grooming and hygiene. Appropriate level of alertness .  Attitude/Behavior: engaging with no eye contact.  Motor: WNL; dyskinesias not evident.   Speech: clear, coherent  Mood: appears euthymic  Affect: blunted  Thought process: patient appears still disorganized and illogical, associations are not appropriate.  Thought content: patient appears delusional, he denies suicidal thoughts, denies homicidal thoughts.  Thought perception: patient possible experiences visual hallucinations.   Cognition: patient is alert and partially-oriented in self, place, month, not in situation; with impaired attention and concentration.  Insight: poor  Judgement: poor   Physical Exam: Physical Exam ROS Blood pressure 117/70, pulse 95, temperature 97.7 F (36.5 C), temperature source Oral,  resp. rate 16, height '5\' 8"'$  (1.727 m), weight 85.7 kg, SpO2 95 %. Body mass index is 28.73 kg/m.  Treatment Plan Summary: Daily contact with patient to assess and evaluate symptoms and progress in treatment and Medication management  Assessment and Plan: Patient is a 76 y.o. male patient admitted after suicide attempt  on multiple sedative/hypnotic type medications.   Patient`s mental status appear to be slowly improving.  Continue delirium precautions: Minimize use of benzodiazepines, anticholinergics, and opiates, to the extent possible, as these can worsen mentation. Utilize non-pharmacological interventions including frequent reorientation, maintaining day/night distinction (blinds closed during night, open during day), minimizing excessive stimulation, staff continuity, reorient the patient frequently, provide easily visible clock and calendar, provide sensory aids like glasses, hearing aids, encourage ambulation, regular activities and visitors to maintain cognitive stimulation.  Continue Risperdal 1 mg nightly Continue Klonopin 0.5 mg p.o. twice daily as needed See chart for agitation protocol with Haldol and Ativan.   Disposition: Recommend psychiatric Inpatient admission when medically cleared.  Larita Fife, MD 10/29/2022 12:41 PM

## 2022-10-29 NOTE — Progress Notes (Signed)
[facility]  PROGRESS NOTE  David Kim EUM:[id number] DOB: [birth year] DOA: 10/15/2022 PCP: Biagio Borg, MD   Brief HPI:   76 yo male with PMH paraplegia, severe MDD, persistent adjustment disorder with mixed anxiety and depressed mood ,PUD, gout, HTN, IBS, OSA, prostate cancer, GERD.  He was recently evaluated under IVC last month after being depressed and expressing suicidal ideation.  He was treated by psychiatry on their service and cleared for discharge home with decrease in his Klonopin dose on 11/13.  Patient presented to [facility] emergency department after home health aide discovered patient took an unknown amount of pills.   During the patient's emergency department evaluation, discussions with EMS revealed there were opened bottles of losartan, tizanidine, oxybutynin, baclofen, and clonazepam. Upon initial interview the patient endorsed at least taking clonazepam and tizanidine but would not provide additional information.  Initial evaluation also revealed multiple SIRS criteria with fever of 103.2 and UA suggestive of UTI prompting initiation of intravenous ceftriaxone.  Urine culture came back positive for E. coli which was pansensitive.  On 1214 also was found to have left lower lobe infiltrate on chest x-ray, antibiotic was changed to vancomycin and cefepime.   Subjective   Patient seen and examined, no new complaints.  Awaiting transfer to inpatient psych facility.   Assessment/Plan:    Intentional overdose -Suspected intentional overdose; found to have open bottles of losartan, tizanidine, oxybutynin, baclofen and clonazepam by EMS -Initially all the medications were held due to overdose -Then benzodiazepines were resumed as they were concerned for withdrawal which has been slowly tapered off since then -Consider IVC paperwork if patient attempts to elope -Continue one-to-one sitter for safety precaution -Psych is following; started on Risperdal for  hallucinations with some improvement   Sepsis -Secondary to E. coli UTI -Also found to have left lobe infiltrate on chest x-ray on 10/19/2022 -Antibiotics changed from ceftriaxone to cefepime and vancomycin -Antibiotics completed on 10/24/2022  Acute hypoxemic respiratory failure -Resolved  Metabolic encephalopathy -Continues to exhibit confusion, myoclonus and visual hallucinations -Psych as started on Risperdal today -Likely in setting of infection, drug overdose -MRI brain unremarkable -Recent TSH and B12 unremarkable -EEG on 10/20/2022 showed no obvious seizure activity  GI bleed -Episode of bright red blood per rectum on 12/12 -Patient is s/p 2 units PRBC on 12/12 -GI was consulted and they suspect etiology likely from stercoral colitis and ulceration -No evidence of GI bleed since then -H&H has been stable  Bacteremia due to E. Coli -Antibiotics course completed on 12/19  Stercoral colitis -History of severe constipation -In the past, patient's wife was primarily involved in performing regular rectal stimulation and disimpactions prior to her death -Continue MiraLAX, Colace, senna; patient moving his bowels regularly -CT imaging was suggestive of stercoral colitis -Patient has history of paraplegia, likely has neurogenic bowel -Continue disimpaction as needed  Severe recurrent major depression -Psych following -As per patient's niece; .  Patient has become severely depressed since his wife passed away in 16-Sep-2023.  She was essentially the patient's sole caretaker and he was completely dependent on her.  Since her passing, he has began "shutting down   Hypertension -Continue amlodipine  Paraplegia -Frequent turning to minimize element of pressure ulceration -Fall precautions  Hypokalemia -Potassium is 3.4 -Replace potassium and follow BMP in am   Goals of care, counseling/discussion Goals of care discussion 12/12 with [name] patient's niece along with  multiple other family members. No one has power of attorney.  Decision  making for patient's care has been shared between the patient's son-in-law, daughter-in-law and niece. discussed goals of care with niece and family on 12/14.  They are well aware of the patient's guarded prognosis.   Patient is currently DNR Palliative care has been consulted and is following along, their assistance is appreciated.  Medications     amLODipine  10 mg Oral Daily   baclofen  10 mg Oral BID   clonazePAM  0.5 mg Oral BID   docusate sodium  100 mg Oral BID   enoxaparin (LOVENOX) injection  40 mg Subcutaneous Q24H   leptospermum manuka honey  1 Application Topical Daily   melatonin  10 mg Oral QHS   pantoprazole  40 mg Oral Daily   polyethylene glycol  17 g Oral BID   potassium chloride  40 mEq Oral Once   risperiDONE  1 mg Oral QHS   senna-docusate  1 tablet Oral BID     Data Reviewed:   CBG:  No results for input(s): "GLUCAP" in the last 168 hours.  SpO2: 98 % O2 Flow Rate (L/min): 2 L/min    Vitals:   10/28/22 1131 10/28/22 2032 10/29/22 0633 10/29/22 1324  BP: 128/69 127/63 117/70 121/67  Pulse: 94 (!) 108 95 91  Resp: '18 18 16 17  '$ Temp: 98.1 F (36.7 C) 98.8 F (37.1 C) 97.7 F (36.5 C) 98.4 F (36.9 C)  TempSrc: Oral Oral Oral Oral  SpO2: 98% 97% 95% 98%  Weight:      Height:          Data Reviewed:  Basic Metabolic Panel: Recent Labs  Lab 10/23/22 0804 10/24/22 0558 10/25/22 0505 10/26/22 0420 10/27/22 0446  NA 141 135 138 137 138  K 3.7 3.5 3.2* 3.4* 3.9  CL 111 105 105 106 107  CO2 '24 23 25 25 23  '$ GLUCOSE 108* 128* 110* 115* 117*  BUN '9 9 10 12 13  '$ CREATININE 0.60* 0.60* 0.63 0.63 0.59*  CALCIUM 8.4* 8.1* 8.1* 8.1* 8.4*  MG 2.0 1.7 2.1  --   --     CBC: Recent Labs  Lab 10/23/22 0804 10/24/22 0558 10/25/22 0505  WBC 10.1 10.4 9.9  NEUTROABS 7.6 7.9* 7.0  HGB 11.9* 12.5* 12.2*  HCT 37.1* 39.0 38.8*  MCV 92.1 92.2 92.8  PLT 377 402* 401*     LFT Recent Labs  Lab 10/23/22 0804 10/24/22 0558 10/25/22 0505  AST 37 35 31  ALT 35 35 32  ALKPHOS 57 55 53  BILITOT 0.8 0.9 0.5  PROT 5.3* 5.5* 5.4*  ALBUMIN 2.7* 2.5* 2.8*     Antibiotics: Anti-infectives (From admission, onward)    Start     Dose/Rate Route Frequency Ordered Stop   10/20/22 0800  vancomycin (VANCOREADY) IVPB 750 mg/150 mL        750 mg 150 mL/hr over 60 Minutes Intravenous Every 12 hours 10/19/22 1936 10/24/22 2200   10/19/22 1800  vancomycin (VANCOREADY) IVPB 1500 mg/300 mL        1,500 mg 150 mL/hr over 120 Minutes Intravenous  Once 10/19/22 1653 10/19/22 2047   10/19/22 1800  ceFEPIme (MAXIPIME) 2 g in sodium chloride 0.9 % 100 mL IVPB        2 g 200 mL/hr over 30 Minutes Intravenous Every 8 hours 10/19/22 1653 10/24/22 0339   10/17/22 1815  metroNIDAZOLE (FLAGYL) IVPB 500 mg  Status:  Discontinued        500 mg 100 mL/hr over 60 Minutes  Intravenous 2 times daily 10/17/22 1758 10/18/22 1135   10/16/22 2200  cefTRIAXone (ROCEPHIN) 2 g in sodium chloride 0.9 % 100 mL IVPB  Status:  Discontinued        2 g 200 mL/hr over 30 Minutes Intravenous Every 24 hours 10/16/22 1307 10/19/22 1636   10/16/22 2100  cefTRIAXone (ROCEPHIN) 1 g in sodium chloride 0.9 % 100 mL IVPB  Status:  Discontinued        1 g 200 mL/hr over 30 Minutes Intravenous Every 24 hours 10/15/22 2335 10/16/22 1307   10/15/22 2145  cefTRIAXone (ROCEPHIN) 2 g in sodium chloride 0.9 % 100 mL IVPB        2 g 200 mL/hr over 30 Minutes Intravenous  Once 10/15/22 2134 10/15/22 2223        DVT prophylaxis:   Code Status: DNR  Family Communication:    CONSULTS psychiatry   Objective    Physical Examination:   Appears in no acute distress Heart S1-S2, regular Abdomen is soft, nontender Is alert, oriented to self, place and person   Status is: Inpatient:      Pressure Injury 10/16/22 Sacrum Anterior;Mid Unstageable - Full thickness tissue loss in which the base of the  injury is covered by slough (yellow, tan, gray, green or brown) and/or eschar (tan, brown or black) in the wound bed. (Active)  10/16/22 0000  Location: Sacrum  Location Orientation: Anterior;Mid  Staging: Unstageable - Full thickness tissue loss in which the base of the injury is covered by slough (yellow, tan, gray, green or brown) and/or eschar (tan, brown or black) in the wound bed.  Wound Description (Comments):   Present on Admission: Yes     Pressure Injury 10/16/22 Heel Left;Anterior Stage 1 -  Intact skin with non-blanchable redness of a localized area usually over a bony prominence. (Active)  10/16/22 0000  Location: Heel  Location Orientation: Left;Anterior  Staging: Stage 1 -  Intact skin with non-blanchable redness of a localized area usually over a bony prominence.  Wound Description (Comments):   Present on Admission: Yes     Pressure Injury 10/24/22 Thigh Left;Posterior;Proximal Stage 2 -  Partial thickness loss of dermis presenting as a shallow open injury with a red, pink wound bed without slough. (Active)  10/24/22 1940  Location: Thigh  Location Orientation: Left;Posterior;Proximal  Staging: Stage 2 -  Partial thickness loss of dermis presenting as a shallow open injury with a red, pink wound bed without slough.  Wound Description (Comments):   Present on Admission:         Jay   [facility] If 7PM-7AM, please contact night-coverage at [email or web address], Office  [phone]   10/29/2022, 2:57 PM  LOS: 14 days

## 2022-10-29 NOTE — Plan of Care (Signed)
  Problem: Clinical Measurements: Goal: Diagnostic test results will improve Outcome: Progressing Goal: Respiratory complications will improve Outcome: Progressing Goal: Cardiovascular complication will be avoided Outcome: Progressing   Problem: Activity: Goal: Risk for activity intolerance will decrease Outcome: Progressing   Problem: Nutrition: Goal: Adequate nutrition will be maintained Outcome: Progressing   Problem: Safety: Goal: Ability to remain free from injury will improve Outcome: Progressing

## 2022-10-29 NOTE — Progress Notes (Signed)
Occupational Therapy Treatment Patient Details Name: HUNT ZAJICEK MRN: [id number] DOB: 1946/07/08 Today's Date: 10/29/2022   History of present illness Mr. Bachmeier is a 76 yo male with PMH paraplegia, severe MDD, persistent adjustment disorder with mixed anxiety and depressed mood ,PUD, gout, HTN, IBS, OSA, prostate cancer, GERD.   He was recently evaluated under IVC last month for expressing suicidal ideation. he was cleared for discharge home on 11/13.  Patient presented to [facility] long ED after home health aide discovered patient took an unknown amount of pills on 10/15/22   OT comments  Patient was noted to make progress towards short term goals with patient able to engage in sitting EOB for grooming tasks. Patient had incontinence episode of stool transitioned back to bed for hygiene tasks. Patient noted to have increased redness and areas at risk of breakdown on bottom. Nurse aware. Patient was educated on importance of transitioning between sides to reduce pressure on bottom. Patient and sitter aware. Patients bed positioned in room to encourage patient to maintain side lying on R side for a brief period and still be able to see TV. Patient's discharge plan remains appropriate at this time. OT will continue to follow acutely.     Recommendations for follow up therapy are one component of a multi-disciplinary discharge planning process, led by the attending physician.  Recommendations may be updated based on patient status, additional functional criteria and insurance authorization.    Follow Up Recommendations  Skilled nursing-short term rehab (<3 hours/day)     Assistance Recommended at Discharge Frequent or constant Supervision/Assistance  Patient can return home with the following  A lot of help with walking and/or transfers;A lot of help with bathing/dressing/bathroom;Assistance with cooking/housework;Direct supervision/assist for medications management;Direct supervision/assist for  financial management;Assist for transportation;Help with stairs or ramp for entrance   Equipment Recommendations  None recommended by OT       Precautions / Restrictions Precautions Precautions: Fall Precaution Comments: paraplegia, incontinence Restrictions Weight Bearing Restrictions: No       Mobility Bed Mobility Overal bed mobility: Needs Assistance Bed Mobility: Rolling, Sidelying to Sit, Sit to Sidelying Rolling: Min assist Sidelying to sit: Min assist     Sit to sidelying: Min assist         Balance Overall balance assessment: Needs assistance Sitting-balance support: Single extremity supported Sitting balance-Leahy Scale: Fair Sitting balance - Comments: if holding rail, poor without UE       Standing balance comment: did not assess patient does not stand at baseline           ADL either performed or assessed with clinical judgement   ADL Overall ADL's : Needs assistance/impaired     Grooming: Set up;Sitting;Wash/dry hands;Wash/dry face;Oral care Grooming Details (indicate cue type and reason): EOB with increased time noted to have some lateral leaning at times. but able to correct with cues.                   Toilet Transfer Details (indicate cue type and reason): unable to transfer with patient soiled upon sitting EOB. patient unrealistic on transfering while soiled. returned to bed. Toileting- Clothing Manipulation and Hygiene: Total assistance;Bed level Toileting - Clothing Manipulation Details (indicate cue type and reason): found to be incontinent              Cognition Arousal/Alertness: Awake/alert Behavior During Therapy: WFL for tasks assessed/performed Overall Cognitive Status: No family/caregiver present to determine baseline cognitive functioning  General Comments: noted to have some confusion during session but was approprite and able to follow commands.                   Pertinent Vitals/  Pain       Pain Assessment Pain Assessment: No/denies pain   Frequency  Min 2X/week        Progress Toward Goals  OT Goals(current goals can now be found in the care plan section)  Progress towards OT goals: Progressing toward goals     Plan Discharge plan remains appropriate       AM-PAC OT "6 Clicks" Daily Activity     Outcome Measure   Help from another person eating meals?: None Help from another person taking care of personal grooming?: A Little Help from another person toileting, which includes using toliet, bedpan, or urinal?: Total Help from another person bathing (including washing, rinsing, drying)?: A Lot Help from another person to put on and taking off regular upper body clothing?: A Little Help from another person to put on and taking off regular lower body clothing?: A Lot 6 Click Score: 15    End of Session    OT Visit Diagnosis: Other symptoms and signs involving cognitive function   Activity Tolerance Patient tolerated treatment well   Patient Left in bed;with call bell/phone within reach;with nursing/sitter in room   Nurse Communication Other (comment) (ok to see)        Time: 6270-3500 OT Time Calculation (min): 20 min  Charges: OT General Charges $OT Visit: 1 Visit OT Treatments $Self Care/Home Management : 8-22 mins  Rennie Plowman, MS Acute Rehabilitation Department Office# [phone]   Willa Rough 10/29/2022, 4:28 PM

## 2022-10-30 DIAGNOSIS — N3000 Acute cystitis without hematuria: Secondary | ICD-10-CM | POA: Diagnosis not present

## 2022-10-30 DIAGNOSIS — E876 Hypokalemia: Secondary | ICD-10-CM | POA: Diagnosis not present

## 2022-10-30 DIAGNOSIS — I10 Essential (primary) hypertension: Secondary | ICD-10-CM | POA: Diagnosis not present

## 2022-10-30 DIAGNOSIS — G9340 Encephalopathy, unspecified: Secondary | ICD-10-CM | POA: Diagnosis not present

## 2022-10-30 NOTE — Progress Notes (Signed)
[facility]  PROGRESS NOTE  David Kim:[id number] DOB: 03-Apr-1946 DOA: 10/15/2022 PCP: Biagio Borg, MD   Brief HPI:   76 yo male with PMH paraplegia, severe MDD, persistent adjustment disorder with mixed anxiety and depressed mood ,PUD, gout, HTN, IBS, OSA, prostate cancer, GERD.  He was recently evaluated under IVC last month after being depressed and expressing suicidal ideation.  He was treated by psychiatry on their service and cleared for discharge home with decrease in his Klonopin dose on 11/13.  Patient presented to [facility] emergency department after home health aide discovered patient took an unknown amount of pills.   During the patient's emergency department evaluation, discussions with EMS revealed there were opened bottles of losartan, tizanidine, oxybutynin, baclofen, and clonazepam. Upon initial interview the patient endorsed at least taking clonazepam and tizanidine but would not provide additional information.  Initial evaluation also revealed multiple SIRS criteria with fever of 103.2 and UA suggestive of UTI prompting initiation of intravenous ceftriaxone.  Urine culture came back positive for E. coli which was pansensitive.  On 1214 also was found to have left lower lobe infiltrate on chest x-ray, antibiotic was changed to vancomycin and cefepime.   Subjective   Patient seen and examined, no new complaints.   Assessment/Plan:    Intentional overdose -Suspected intentional overdose; found to have open bottles of losartan, tizanidine, oxybutynin, baclofen and clonazepam by EMS -Initially all the medications were held due to overdose -Then benzodiazepines were resumed as they were concerned for withdrawal which has been slowly tapered off since then -Consider IVC paperwork if patient attempts to elope -Continue one-to-one sitter for safety precaution -Psych is following; started on Risperdal for hallucinations with some  improvement   Sepsis -Secondary to E. coli UTI -Also found to have left lobe infiltrate on chest x-ray on 10/19/2022 -Antibiotics changed from ceftriaxone to cefepime and vancomycin -Antibiotics completed on 10/24/2022  Acute hypoxemic respiratory failure -Resolved  Metabolic encephalopathy -Continues to exhibit confusion, myoclonus and visual hallucinations -Psych as started on Risperdal today -Likely in setting of infection, drug overdose -MRI brain unremarkable -Recent TSH and B12 unremarkable -EEG on 10/20/2022 showed no obvious seizure activity  GI bleed -Episode of bright red blood per rectum on 12/12 -Patient is s/p 2 units PRBC on 12/12 -GI was consulted and they suspect etiology likely from stercoral colitis and ulceration -No evidence of GI bleed since then -H&H has been stable  Bacteremia due to E. Coli -Antibiotics course completed on 12/19  Stercoral colitis -History of severe constipation -In the past, patient's wife was primarily involved in performing regular rectal stimulation and disimpactions prior to her death -Continue MiraLAX, Colace, senna; patient moving his bowels regularly -CT imaging was suggestive of stercoral colitis -Patient has history of paraplegia, likely has neurogenic bowel -Continue disimpaction as needed  Severe recurrent major depression -Psych following -As per patient's niece; .  Patient has become severely depressed since his wife passed away in 09/12/23.  She was essentially the patient's sole caretaker and he was completely dependent on her.  Since her passing, he has began "shutting down   Hypertension -Continue amlodipine  Paraplegia -Frequent turning to minimize element of pressure ulceration -Fall precautions  Hypokalemia -Potassium is 3.4 -Replace potassium and follow BMP in am   Goals of care, counseling/discussion Goals of care discussion 12/12 with [name] patient's niece along with multiple other family  members. No one has power of attorney.  Decision making for patient's care has been shared  between the patient's son-in-law, daughter-in-law and niece. discussed goals of care with niece and family on 12/14.  They are well aware of the patient's guarded prognosis.   Patient is currently DNR Palliative care has been consulted and is following along, their assistance is appreciated.  Medications     amLODipine  10 mg Oral Daily   baclofen  10 mg Oral BID   clonazePAM  0.5 mg Oral BID   docusate sodium  100 mg Oral BID   enoxaparin (LOVENOX) injection  40 mg Subcutaneous Q24H   leptospermum manuka honey  1 Application Topical Daily   melatonin  10 mg Oral QHS   pantoprazole  40 mg Oral Daily   polyethylene glycol  17 g Oral BID   potassium chloride  40 mEq Oral Once   risperiDONE  1 mg Oral QHS   senna-docusate  1 tablet Oral BID     Data Reviewed:   CBG:  No results for input(s): "GLUCAP" in the last 168 hours.  SpO2: 95 % O2 Flow Rate (L/min): 2 L/min    Vitals:   10/29/22 1324 10/29/22 2058 10/30/22 0517 10/30/22 1438  BP: 121/67 122/65 124/74 129/67  Pulse: 91 (!) 101 98 (!) 106  Resp: 18 19 (!) 22 17  Temp: 98.4 F (36.9 C) 98.8 F (37.1 C) 98.2 F (36.8 C) 97.9 F (36.6 C)  TempSrc: Oral Oral Oral   SpO2: 98% 97% 96% 95%  Weight:      Height:          Data Reviewed:  Basic Metabolic Panel: Recent Labs  Lab 10/24/22 0558 10/25/22 0505 10/26/22 0420 10/27/22 0446  NA 135 138 137 138  K 3.5 3.2* 3.4* 3.9  CL 105 105 106 107  CO2 '23 25 25 23  '$ GLUCOSE 128* 110* 115* 117*  BUN '9 10 12 13  '$ CREATININE 0.60* 0.63 0.63 0.59*  CALCIUM 8.1* 8.1* 8.1* 8.4*  MG 1.7 2.1  --   --     CBC: Recent Labs  Lab 10/24/22 0558 10/25/22 0505  WBC 10.4 9.9  NEUTROABS 7.9* 7.0  HGB 12.5* 12.2*  HCT 39.0 38.8*  MCV 92.2 92.8  PLT 402* 401*    LFT Recent Labs  Lab 10/24/22 0558 10/25/22 0505  AST 35 31  ALT 35 32  ALKPHOS 55 53  BILITOT 0.9 0.5  PROT  5.5* 5.4*  ALBUMIN 2.5* 2.8*     Antibiotics: Anti-infectives (From admission, onward)    Start     Dose/Rate Route Frequency Ordered Stop   10/20/22 0800  vancomycin (VANCOREADY) IVPB 750 mg/150 mL        750 mg 150 mL/hr over 60 Minutes Intravenous Every 12 hours 10/19/22 1936 10/24/22 2200   10/19/22 1800  vancomycin (VANCOREADY) IVPB 1500 mg/300 mL        1,500 mg 150 mL/hr over 120 Minutes Intravenous  Once 10/19/22 1653 10/19/22 2047   10/19/22 1800  ceFEPIme (MAXIPIME) 2 g in sodium chloride 0.9 % 100 mL IVPB        2 g 200 mL/hr over 30 Minutes Intravenous Every 8 hours 10/19/22 1653 10/24/22 0339   10/17/22 1815  metroNIDAZOLE (FLAGYL) IVPB 500 mg  Status:  Discontinued        500 mg 100 mL/hr over 60 Minutes Intravenous 2 times daily 10/17/22 1758 10/18/22 1135   10/16/22 2200  cefTRIAXone (ROCEPHIN) 2 g in sodium chloride 0.9 % 100 mL IVPB  Status:  Discontinued  2 g 200 mL/hr over 30 Minutes Intravenous Every 24 hours 10/16/22 1307 10/19/22 1636   10/16/22 2100  cefTRIAXone (ROCEPHIN) 1 g in sodium chloride 0.9 % 100 mL IVPB  Status:  Discontinued        1 g 200 mL/hr over 30 Minutes Intravenous Every 24 hours 10/15/22 2335 10/16/22 1307   10/15/22 2145  cefTRIAXone (ROCEPHIN) 2 g in sodium chloride 0.9 % 100 mL IVPB        2 g 200 mL/hr over 30 Minutes Intravenous  Once 10/15/22 2134 10/15/22 2223        DVT prophylaxis:   Code Status: DNR  Family Communication:    CONSULTS psychiatry   Objective    Physical Examination:   Appears in no acute distress S1-S2, regular, no murmur auscultated Lungs clear to auscultation bilaterally   Status is: Inpatient:      Pressure Injury 10/16/22 Sacrum Anterior;Mid Unstageable - Full thickness tissue loss in which the base of the injury is covered by slough (yellow, tan, gray, green or brown) and/or eschar (tan, brown or black) in the wound bed. (Active)  10/16/22 0000  Location: Sacrum  Location  Orientation: Anterior;Mid  Staging: Unstageable - Full thickness tissue loss in which the base of the injury is covered by slough (yellow, tan, gray, green or brown) and/or eschar (tan, brown or black) in the wound bed.  Wound Description (Comments):   Present on Admission: Yes     Pressure Injury 10/16/22 Heel Left;Anterior Stage 1 -  Intact skin with non-blanchable redness of a localized area usually over a bony prominence. (Active)  10/16/22 0000  Location: Heel  Location Orientation: Left;Anterior  Staging: Stage 1 -  Intact skin with non-blanchable redness of a localized area usually over a bony prominence.  Wound Description (Comments):   Present on Admission: Yes     Pressure Injury 10/24/22 Thigh Left;Posterior;Proximal Stage 2 -  Partial thickness loss of dermis presenting as a shallow open injury with a red, pink wound bed without slough. (Active)  10/24/22 1940  Location: Thigh  Location Orientation: Left;Posterior;Proximal  Staging: Stage 2 -  Partial thickness loss of dermis presenting as a shallow open injury with a red, pink wound bed without slough.  Wound Description (Comments):   Present on Admission:         [place]   [facility] If 7PM-7AM, please contact night-coverage at [email or web address], Office  [phone]   10/30/2022, 2:52 PM  LOS: 15 days

## 2022-10-31 DIAGNOSIS — G9340 Encephalopathy, unspecified: Secondary | ICD-10-CM | POA: Diagnosis not present

## 2022-10-31 DIAGNOSIS — N3000 Acute cystitis without hematuria: Secondary | ICD-10-CM | POA: Diagnosis not present

## 2022-10-31 DIAGNOSIS — E876 Hypokalemia: Secondary | ICD-10-CM | POA: Diagnosis not present

## 2022-10-31 DIAGNOSIS — T50902A Poisoning by unspecified drugs, medicaments and biological substances, intentional self-harm, initial encounter: Secondary | ICD-10-CM | POA: Diagnosis not present

## 2022-10-31 DIAGNOSIS — I10 Essential (primary) hypertension: Secondary | ICD-10-CM | POA: Diagnosis not present

## 2022-10-31 NOTE — Progress Notes (Signed)
[facility]  PROGRESS NOTE  David Kim JHE:[id number] DOB: 02/08/1946 DOA: 10/15/2022 PCP: Biagio Borg, MD   Brief HPI:   76 yo male with PMH paraplegia, severe MDD, persistent adjustment disorder with mixed anxiety and depressed mood ,PUD, gout, HTN, IBS, OSA, prostate cancer, GERD.  He was recently evaluated under IVC last month after being depressed and expressing suicidal ideation.  He was treated by psychiatry on their service and cleared for discharge home with decrease in his Klonopin dose on 11/13.  Patient presented to [facility] emergency department after home health aide discovered patient took an unknown amount of pills.   During the patient's emergency department evaluation, discussions with EMS revealed there were opened bottles of losartan, tizanidine, oxybutynin, baclofen, and clonazepam. Upon initial interview the patient endorsed at least taking clonazepam and tizanidine but would not provide additional information.  Initial evaluation also revealed multiple SIRS criteria with fever of 103.2 and UA suggestive of UTI prompting initiation of intravenous ceftriaxone.  Urine culture came back positive for E. coli which was pansensitive.  On 1214 also was found to have left lower lobe infiltrate on chest x-ray, antibiotic was changed to vancomycin and cefepime.   Subjective   Patient seen and examined, denies any complaints.   Assessment/Plan:    Intentional overdose -Suspected intentional overdose; found to have open bottles of losartan, tizanidine, oxybutynin, baclofen and clonazepam by EMS -Initially all the medications were held due to overdose -Then benzodiazepines were resumed as they were concerned for withdrawal which has been slowly tapered off since then -Consider IVC paperwork if patient attempts to elope -Continue one-to-one sitter for safety precaution -Psych is following; started on Risperdal for hallucinations with some  improvement -Patient to go to inpatient psych facility, no facility has accepted the patient yet.   Sepsis -Secondary to E. coli UTI -Also found to have left lobe infiltrate on chest x-ray on 10/19/2022 -Antibiotics changed from ceftriaxone to cefepime and vancomycin -Antibiotics completed on 10/24/2022  Acute hypoxemic respiratory failure -Resolved  Metabolic encephalopathy -Continues to exhibit confusion, myoclonus and visual hallucinations -Psych as started on Risperdal today -Likely in setting of infection, drug overdose -MRI brain unremarkable -Recent TSH and B12 unremarkable -EEG on 10/20/2022 showed no obvious seizure activity  GI bleed -Episode of bright red blood per rectum on 12/12 -Patient is s/p 2 units PRBC on 12/12 -GI was consulted and they suspect etiology likely from stercoral colitis and ulceration -No evidence of GI bleed since then -H&H has been stable  Bacteremia due to E. Coli -Antibiotics course completed on 12/19  Stercoral colitis -History of severe constipation -In the past, patient's wife was primarily involved in performing regular rectal stimulation and disimpactions prior to her death -Continue MiraLAX, Colace, senna; patient moving his bowels regularly -CT imaging was suggestive of stercoral colitis -Patient has history of paraplegia, likely has neurogenic bowel -Continue disimpaction as needed  Severe recurrent major depression -Psych following -As per patient's niece; .  Patient has become severely depressed since his wife passed away in 24-Aug-2023.  She was essentially the patient's sole caretaker and he was completely dependent on her.  Since her passing, he has began "shutting down   Hypertension -Continue amlodipine  Paraplegia -Frequent turning to minimize element of pressure ulceration -Fall precautions  Hypokalemia -Replete  Goals of care, counseling/discussion Goals of care discussion 12/12 with [name] patient's niece  along with multiple other family members. No one has power of attorney.  Decision making for patient's  care has been shared between the patient's son-in-law, daughter-in-law and niece. discussed goals of care with niece and family on 12/14.  They are well aware of the patient's guarded prognosis.   Patient is currently DNR Palliative care has been consulted and is following along, their assistance is appreciated.  Medications     amLODipine  10 mg Oral Daily   baclofen  10 mg Oral BID   clonazePAM  0.5 mg Oral BID   docusate sodium  100 mg Oral BID   enoxaparin (LOVENOX) injection  40 mg Subcutaneous Q24H   leptospermum manuka honey  1 Application Topical Daily   melatonin  10 mg Oral QHS   pantoprazole  40 mg Oral Daily   polyethylene glycol  17 g Oral BID   potassium chloride  40 mEq Oral Once   risperiDONE  1 mg Oral QHS   senna-docusate  1 tablet Oral BID     Data Reviewed:   CBG:  No results for input(s): "GLUCAP" in the last 168 hours.  SpO2: 96 % O2 Flow Rate (L/min): 2 L/min    Vitals:   10/30/22 1438 10/30/22 1947 10/31/22 0500 10/31/22 1308  BP: 129/67 127/73 130/64 122/71  Pulse: (!) 106 (!) 103 97 (!) 109  Resp: '17 19 18 17  '$ Temp: 97.9 F (36.6 C) 99 F (37.2 C) 98.4 F (36.9 C) 98.7 F (37.1 C)  TempSrc:  Oral Oral Oral  SpO2: 95% 95% 97% 96%  Weight:      Height:          Data Reviewed:  Basic Metabolic Panel: Recent Labs  Lab 10/25/22 0505 10/26/22 0420 10/27/22 0446  NA 138 137 138  K 3.2* 3.4* 3.9  CL 105 106 107  CO2 '25 25 23  '$ GLUCOSE 110* 115* 117*  BUN '10 12 13  '$ CREATININE 0.63 0.63 0.59*  CALCIUM 8.1* 8.1* 8.4*  MG 2.1  --   --     CBC: Recent Labs  Lab 10/25/22 0505  WBC 9.9  NEUTROABS 7.0  HGB 12.2*  HCT 38.8*  MCV 92.8  PLT 401*    LFT Recent Labs  Lab 10/25/22 0505  AST 31  ALT 32  ALKPHOS 53  BILITOT 0.5  PROT 5.4*  ALBUMIN 2.8*     Antibiotics: Anti-infectives (From admission, onward)    Start      Dose/Rate Route Frequency Ordered Stop   10/20/22 0800  vancomycin (VANCOREADY) IVPB 750 mg/150 mL        750 mg 150 mL/hr over 60 Minutes Intravenous Every 12 hours 10/19/22 1936 10/24/22 2200   10/19/22 1800  vancomycin (VANCOREADY) IVPB 1500 mg/300 mL        1,500 mg 150 mL/hr over 120 Minutes Intravenous  Once 10/19/22 1653 10/19/22 2047   10/19/22 1800  ceFEPIme (MAXIPIME) 2 g in sodium chloride 0.9 % 100 mL IVPB        2 g 200 mL/hr over 30 Minutes Intravenous Every 8 hours 10/19/22 1653 10/24/22 0339   10/17/22 1815  metroNIDAZOLE (FLAGYL) IVPB 500 mg  Status:  Discontinued        500 mg 100 mL/hr over 60 Minutes Intravenous 2 times daily 10/17/22 1758 10/18/22 1135   10/16/22 2200  cefTRIAXone (ROCEPHIN) 2 g in sodium chloride 0.9 % 100 mL IVPB  Status:  Discontinued        2 g 200 mL/hr over 30 Minutes Intravenous Every 24 hours 10/16/22 1307 10/19/22 1636   10/16/22 2100  cefTRIAXone (ROCEPHIN) 1 g in sodium chloride 0.9 % 100 mL IVPB  Status:  Discontinued        1 g 200 mL/hr over 30 Minutes Intravenous Every 24 hours 10/15/22 2335 10/16/22 1307   10/15/22 2145  cefTRIAXone (ROCEPHIN) 2 g in sodium chloride 0.9 % 100 mL IVPB        2 g 200 mL/hr over 30 Minutes Intravenous  Once 10/15/22 2134 10/15/22 2223        DVT prophylaxis:   Code Status: DNR  Family Communication:    CONSULTS psychiatry   Objective    Physical Examination:   Appears in no acute distress S1-S2, regular, no murmur auscultated Clear to auscultation bilaterally No edema in the lower extremities  Status is: Inpatient:      Pressure Injury 10/16/22 Sacrum Anterior;Mid Unstageable - Full thickness tissue loss in which the base of the injury is covered by slough (yellow, tan, gray, green or brown) and/or eschar (tan, brown or black) in the wound bed. (Active)  10/16/22 0000  Location: Sacrum  Location Orientation: Anterior;Mid  Staging: Unstageable - Full thickness tissue loss in  which the base of the injury is covered by slough (yellow, tan, gray, green or brown) and/or eschar (tan, brown or black) in the wound bed.  Wound Description (Comments):   Present on Admission: Yes     Pressure Injury 10/16/22 Heel Left;Anterior Stage 1 -  Intact skin with non-blanchable redness of a localized area usually over a bony prominence. (Active)  10/16/22 0000  Location: Heel  Location Orientation: Left;Anterior  Staging: Stage 1 -  Intact skin with non-blanchable redness of a localized area usually over a bony prominence.  Wound Description (Comments):   Present on Admission: Yes     Pressure Injury 10/24/22 Thigh Left;Posterior;Proximal Stage 2 -  Partial thickness loss of dermis presenting as a shallow open injury with a red, pink wound bed without slough. (Active)  10/24/22 1940  Location: Thigh  Location Orientation: Left;Posterior;Proximal  Staging: Stage 2 -  Partial thickness loss of dermis presenting as a shallow open injury with a red, pink wound bed without slough.  Wound Description (Comments):   Present on Admission:         [place]   [facility] If 7PM-7AM, please contact night-coverage at [email or web address], Office  [phone]   10/31/2022, 2:23 PM  LOS: 16 days

## 2022-10-31 NOTE — Progress Notes (Signed)
Physical Therapy Treatment Patient Details Name: David Kim MRN: [id number] DOB: 1945-11-22 Today's Date: 10/31/2022   History of Present Illness David Kim is a 76 yo male with PMH paraplegia, severe MDD, persistent adjustment disorder with mixed anxiety and depressed mood ,PUD, gout, HTN, IBS, OSA, prostate cancer, GERD.   He was recently evaluated under IVC last month for expressing suicidal ideation. he was cleared for discharge home on 11/13.  Patient presented to [facility] long ED after home health aide discovered patient took an unknown amount of pills on 10/15/22    PT Comments    Attempted to get a TRAPEZE installed on pt's bed because "that is what he uses at home for self performance  bed mobility".  MD ordered "Thank You" however was told by RN who was told by Ortho Tech "can not have one because he is on SI precautions and is over the age of 24".  General Comments: AxO x 3 cognition improving.  Stated his spouse passed recenttly.  "She was my whole world" and caregiver as pt has been non amb since a "work related" accident years ago left him paralyzed.   Pt was OOB in recliner via nursing using LIFT.  General transfer comment: first used a sliding board to transfer from drop arm recliner to removed arm rest wheelchair.  Pt required Max Asisst for "set up".  Pt was able to self slide with Mod Assist to prevent forward LOB and Mod Assist to move/position B LE as pt moved.  Pt was slightly nervous as my wheelchair was not same as his.  He did well.  Second, asissted to [facility] due to flutes.  Pt unable to know when he needs to have a BM and also incont urine due to his spinal injury.  Performed "Bear Hug" + 2 asisst for a partial stance 1/4 pivot from wheelchair to [facility] (BSC did NOT have a drop arm).  Assisted with peri care while seated.  Same fashion, "Bear Hug" back to recliner then + 2 side by side assist to scoot to back of chair.  Positione to comfort.  Rec MAXI MOVE back to bed.  Pt is  NOT performing at his prior level of mobility/transfers.  Pt will need ST Rehab at SNF to address mobility and functional decline prior to safely returning home.   Recommendations for follow up therapy are one component of a multi-disciplinary discharge planning process, led by the attending physician.  Recommendations may be updated based on patient status, additional functional criteria and insurance authorization.  Follow Up Recommendations  Skilled nursing-short term rehab (<3 hours/day) Can patient physically be transported by private vehicle: No   Assistance Recommended at Discharge Frequent or constant Supervision/Assistance  Patient can return home with the following A lot of help with walking and/or transfers;A lot of help with bathing/dressing/bathroom;Assist for transportation;Help with stairs or ramp for entrance   Equipment Recommendations  None recommended by PT    Recommendations for Other Services       Precautions / Restrictions Precautions Precautions: Fall Precaution Comments: paraplegia T12 , incontinence Restrictions Weight Bearing Restrictions: No Other Position/Activity Restrictions: L LE spastic/R LE flaccid     Mobility  Bed Mobility               General bed mobility comments: OOB in recliner via nursing via LIFT    Transfers Overall transfer level: Needs assistance Equipment used: Sliding board, None  General transfer comment: first used a sliding board to transfer from drop arm recliner to removed arm rest wheelchair.  Pt required Max Asisst for "set up".  Pt was able to self slide with Mod Assist to prevent forward LOB and Mod Assist to move/position B LE as pt moved.  Pt was slightly nervous as my wheelchair was not same as his.  He did well.  Second, asissted to [facility] due to flutes.  Pt unable to know when he needs to have a BM and also incont urine due to his spinal injury.  Performed "Bear Hug" + 2 asisst for a partial stance  1/4 pivot from wheelchair to [facility] (BSC did NOT have a drop arm).  Assisted with peri care while seated.  Same fashion, "Bear Hug" back to recliner then + 2 side by side assist to scoot to back of chair.  Positione to comfort.  Rec MAXI MOVE back to bed.    Ambulation/Gait               General Gait Details: NON amb   Stairs             Wheelchair Mobility    Modified Rankin (Stroke Patients Only)       Balance                                            Cognition Arousal/Alertness: Awake/alert Behavior During Therapy: WFL for tasks assessed/performed Overall Cognitive Status: Within Functional Limits for tasks assessed                                 General Comments: AxO x 3 cognition improving.  Stated his spouse passed recenttly.  "She was my whole world" and caregiver as pt has been non amb since a "work related" accident years ago.        Exercises      General Comments        Pertinent Vitals/Pain Pain Assessment Pain Assessment: Faces Faces Pain Scale: Hurts a little bit Pain Location: back Pain Descriptors / Indicators: Aching, Discomfort Pain Intervention(s): Monitored during session, Repositioned    Home Living                          Prior Function            PT Goals (current goals can now be found in the care plan section) Progress towards PT goals: Progressing toward goals    Frequency    Min 2X/week      PT Plan Current plan remains appropriate    Co-evaluation              AM-PAC PT "6 Clicks" Mobility   Outcome Measure  Help needed turning from your back to your side while in a flat bed without using bedrails?: A Lot Help needed moving from lying on your back to sitting on the side of a flat bed without using bedrails?: A Lot Help needed moving to and from a bed to a chair (including a wheelchair)?: A Lot Help needed standing up from a chair using your arms (e.g.,  wheelchair or bedside chair)?: Total Help needed to walk in hospital room?: Total Help needed climbing 3-5 steps with a railing? : Total 6 Click Score:  9    End of Session Equipment Utilized During Treatment: Gait belt Activity Tolerance: Patient tolerated treatment well Patient left: in chair;with call bell/phone within reach;with family/visitor present Nurse Communication: Mobility status PT Visit Diagnosis: Other symptoms and signs involving the nervous system (R29.898)     Time: 7408-1448 PT Time Calculation (min) (ACUTE ONLY): 30 min  Charges:  $Therapeutic Activity: 23-37 mins                     Rica Koyanagi  PTA [place] [facility] M-F          [phone] Weekend pager [phone]

## 2022-10-31 NOTE — Consult Note (Signed)
[facility] Face-to-Face Psychiatry Consult   Reason for Consult:  suicidal attempt Referring Physician:  Dr. Cyd Silence Patient Identification: David Kim MRN:  [id number] Principal Diagnosis: Intentional overdose [facility]) Diagnosis:  Principal Problem:   Intentional overdose ([place]) Active Problems:   Severe recurrent major depression ([place])   Paraplegia ([place])   Essential hypertension   GERD   Toxic metabolic encephalopathy   Goals of care, counseling/discussion   Sepsis ([place])   Metabolic acidosis   Bacteremia due to Escherichia coli   Pressure injury of sacral region, stage 2 ([place])   Stercoral colitis   GIB (gastrointestinal bleeding)   Hypokalemia   Stercoral ulcer of rectum   Abnormal finding on GI tract imaging   Acute blood loss anemia   Chronic constipation   Total Time spent with patient: 45 minutes  Subjective:   David Kim is a 76 y.o. male patient admitted after suicide attempt on multiple sedative/hypnotic type medications.   HPI: Patient seen in his room, watching TV. Sitter is at the bedside. Patient is alert, oriented in self, place, month, year.  He describes his mood as "pretty good".  He endorses minimal depression at this time, denies suicidal ideation, and or any plans to attempt suicide in the near future.  He states "I do not plan to go unless I am called home by God.  I will not do that anymore.  I did attempt suicide by taking some dirt.  It did not work."  He is observed to be sitting upright in his bedside chair, bedside table within reach.  He remains under suicide precautions due to extent of suicide attempt.  He denies any delusions and or hallucinations during my reassessment today.  He states his appetite is good, and denied any difficulty with sleep at this time.  Patient continues to meet inpatient psychiatric criteria at this time.  He is now medically stable for transfer to inpatient psychiatric facility.  He does provide consent to contact [facility] regarding  placement, and has expressed clear interest in returning home with his 24-hour nursing coverage.    Past Medical History:  Past Medical History:  Diagnosis Date   Allergic rhinitis    ALLERGIC RHINITIS 10/01/2007   Qualifier: Diagnosis of  By: Jenny Reichmann MD, Hunt Oris    Anxiety    Carpal tunnel syndrome    right   Depression    Depression    Erectile dysfunction    Fatigue    GERD (gastroesophageal reflux disease)    Gout    Hypertension    HYPOGONADISM 03/31/2008   Qualifier: Diagnosis of  By: Jenny Reichmann MD, Hunt Oris    IBS (irritable bowel syndrome)    Low back pain    OSA (obstructive sleep apnea)    Paraplegia ([place]) 04/23/2009   Qualifier: Diagnosis of  By: Jenny Reichmann MD, Hunt Oris    Peptic ulcer disease    PEPTIC ULCER DISEASE 10/01/2007   Qualifier: Diagnosis of  By: Jenny Reichmann MD, Hunt Oris    Prostate cancer [facility])    PROSTATE CANCER, HX OF 05/23/2007   Qualifier: Diagnosis of  By: Jenny Reichmann MD, [place] PARALYSIS 04/23/2009   Qualifier: Diagnosis of  By: Jenny Reichmann MD, Hunt Oris    Thoracic spinal cord injury ([place]) 03/12/2012    Past Surgical History:  Procedure Laterality Date   ARTERY AND TENDON REPAIR Left 09/06/2018   Procedure: ARTERY AND TENDON REPAIR;  Surgeon: Charlotte Crumb, MD;  Location: [place];  Service: Orthopedics;  Laterality: Left;   CAST APPLICATION Left 75/02/4919   Procedure: CAST APPLICATION;  Surgeon: Charlotte Crumb, MD;  Location: [place];  Service: Orthopedics;  Laterality: Left;   inguinal heniorrhaphy     left leg surgery after fibula     NERVE REPAIR Left 09/06/2018   Procedure: NERVE REPAIR TIMES TWO;  Surgeon: Charlotte Crumb, MD;  Location: [place];  Service: Orthopedics;  Laterality: Left;   OPEN REDUCTION INTERNAL FIXATION (ORIF) DISTAL PHALANX Left 09/06/2018   Procedure: OPEN REDUCTION INTERNAL FIXATION (ORIF) LEFT LONG FINGER;  Surgeon: Charlotte Crumb, MD;  Location: [place];  Service: Orthopedics;  Laterality: Left;   PILONIDAL CYST / SINUS EXCISION      PROSTATECTOMY     tonsillectomy     WOUND EXPLORATION Left 09/06/2018   Procedure: EXPLORATION OFCOMPLEX INJURY;  Surgeon: Charlotte Crumb, MD;  Location: [place];  Service: Orthopedics;  Laterality: Left;   Family History:  Family History  Problem Relation Age of Onset   High blood pressure Mother    Dementia Mother    Diabetes Father     Social History:  Social History   Substance and Sexual Activity  Alcohol Use No     Social History   Substance and Sexual Activity  Drug Use No    Social History   Socioeconomic History   Marital status: Married    Spouse name: [name]   Number of children: 0   Years of education: 12   Highest education level: Not on file  Occupational History   Occupation: retired    Fish farm manager: RETIRED  Tobacco Use   Smoking status: Never   Smokeless tobacco: Never  Vaping Use   Vaping Use: Never used  Substance and Sexual Activity   Alcohol use: No   Drug use: No   Sexual activity: Not on file  Other Topics Concern   Not on file  Social History Narrative   Patient lives at home with [name]). Patient is disabled. Patient has 12 th grade education.   Right handed.   Caffeine- None   Social Determinants of Health   Financial Resource Strain: Low Risk  (07/24/2022)   Overall Financial Resource Strain (CARDIA)    Difficulty of Paying Living Expenses: Not hard at all  Food Insecurity: No Food Insecurity (10/20/2022)   Hunger Vital Sign    Worried About Running Out of Food in the Last Year: Never true    Ran Out of Food in the Last Year: Never true  Transportation Needs: No Transportation Needs (10/20/2022)   PRAPARE - Hydrologist (Medical): No    Lack of Transportation (Non-Medical): No  Physical Activity: Inactive (07/24/2022)   Exercise Vital Sign    Days of Exercise per Week: 0 days    Minutes of Exercise per Session: 0 min  Stress: No Stress Concern Present (07/24/2022)   [place]    Feeling of Stress : Not at all  Social Connections: Moderately Isolated (07/24/2022)   Social Connection and Isolation Panel [NHANES]    Frequency of Communication with Friends and Family: More than three times a week    Frequency of Social Gatherings with Friends and Family: More than three times a week    Attends Religious Services: Never    Marine scientist or Organizations: No    Attends Archivist Meetings: Never    Marital Status: Married   Additional Social History:  Allergies:   Allergies  Allergen Reactions   Amoxicillin Other (See Comments)    Unknown    Endal Hd Other (See Comments)    Unknown     Labs:  No results found for this or any previous visit (from the past 48 hour(s)).   Current Facility-Administered Medications  Medication Dose Route Frequency Provider Last Rate Last Admin   0.9 %  sodium chloride infusion   Intravenous PRN Shalhoub, Sherryll Burger, MD       acetaminophen (TYLENOL) tablet 650 mg  650 mg Oral Q6H PRN Dwyane Dee, MD   650 mg at 10/17/22 1601   Or   acetaminophen (TYLENOL) suppository 650 mg  650 mg Rectal Q6H PRN Dwyane Dee, MD   650 mg at 10/17/22 1526   amLODipine (NORVASC) tablet 10 mg  10 mg Oral Daily Vernelle Emerald, MD   10 mg at 10/31/22 0932   baclofen (LIORESAL) tablet 10 mg  10 mg Oral BID Vernelle Emerald, MD   10 mg at 10/31/22 3557   clonazePAM (KLONOPIN) tablet 0.5 mg  0.5 mg Oral BID Vernelle Emerald, MD   0.5 mg at 10/31/22 3220   docusate sodium (COLACE) capsule 100 mg  100 mg Oral BID Mansouraty, Telford Nab., MD   100 mg at 10/31/22 0924   enoxaparin (LOVENOX) injection 40 mg  40 mg Subcutaneous Q24H Dwyane Dee, MD   40 mg at 10/31/22 2542   haloperidol (HALDOL) tablet 2 mg  2 mg Oral BID PRN Cinderella, Margaret A       And   LORazepam (ATIVAN) tablet 1 mg  1 mg Oral BID PRN Cinderella, Margaret A       hydrALAZINE (APRESOLINE)  injection 10 mg  10 mg Intravenous Q6H PRN Shalhoub, Sherryll Burger, MD       leptospermum manuka honey ([place]) paste 1 Application  1 Application Topical Daily Dwyane Dee, MD   1 Application at 70/62/37 6283   melatonin tablet 10 mg  10 mg Oral QHS Vernelle Emerald, MD   10 mg at 10/30/22 2116   Oral care mouth rinse  15 mL Mouth Rinse PRN Shalhoub, Sherryll Burger, MD       pantoprazole (PROTONIX) EC tablet 40 mg  40 mg Oral Daily Vernelle Emerald, MD   40 mg at 10/31/22 1517   polyethylene glycol (MIRALAX / GLYCOLAX) packet 17 g  17 g Oral BID Esterwood, Amy S, PA-C   17 g at 10/31/22 6160   polyvinyl alcohol (LIQUIFILM TEARS) 1.4 % ophthalmic solution 1 drop  1 drop Both Eyes PRN Dwyane Dee, MD   1 drop at 10/17/22 2229   potassium chloride SA (KLOR-CON M) CR tablet 40 mEq  40 mEq Oral Once [place], Marge Duncans, MD       risperiDONE (RISPERDAL) tablet 1 mg  1 mg Oral QHS Derrill Center, NP   1 mg at 10/30/22 2116   senna-docusate (Senokot-S) tablet 1 tablet  1 tablet Oral BID Dwyane Dee, MD   1 tablet at 10/31/22 7371   tiZANidine (ZANAFLEX) tablet 4 mg  4 mg Oral Q6H PRN Vernelle Emerald, MD   4 mg at 10/25/22 0845    Musculoskeletal: Strength & Muscle Tone: decreased Gait & Station: unable to stand Patient leans: N/A   Psychiatric Specialty Exam:   Appearance:  CM, wearing hospital clothes, with ok grooming and hygiene. Appropriate level of alertness .  Attitude/Behavior: engaging with no eye contact.  Motor:  WNL; dyskinesias not evident.   Speech: clear, coherent  Mood: appears euthymic  Affect: blunted  Thought process: patient appears still disorganized and illogical, associations are not appropriate.  Thought content: patient appears delusional, he denies suicidal thoughts, denies homicidal thoughts.  Thought perception: patient possible experiences visual hallucinations.   Cognition: patient is alert and partially-oriented in self, place, month, not in situation;  with impaired attention and concentration.  Insight: poor  Judgement: poor   Physical Exam: Physical Exam Vitals and nursing note reviewed.  Constitutional:      Appearance: Normal appearance. He is obese.  Skin:    Capillary Refill: Capillary refill takes less than 2 seconds.  Neurological:     General: No focal deficit present.     Mental Status: He is alert and oriented to person, place, and time. Mental status is at baseline.  Psychiatric:        Mood and Affect: Mood normal.        Behavior: Behavior normal.        Thought Content: Thought content normal.        Judgment: Judgment normal.    Review of Systems  Psychiatric/Behavioral:  Positive for depression. Negative for hallucinations, memory loss, substance abuse and suicidal ideas. The patient is not nervous/anxious and does not have insomnia.    Blood pressure 122/71, pulse (!) 109, temperature 98.7 F (37.1 C), temperature source Oral, resp. rate 17, height '5\' 8"'$  (1.727 m), weight 85.7 kg, SpO2 96 %. Body mass index is 28.73 kg/m.  Treatment Plan Summary: Daily contact with patient to assess and evaluate symptoms and progress in treatment and Medication management  Assessment and Plan: Patient is a 76 y.o. male patient admitted after suicide attempt on multiple sedative/hypnotic type medications.   Patient continues to show much improvement in progression.  Continue delirium precautions: Minimize use of benzodiazepines, anticholinergics, and opiates, to the extent possible, as these can worsen mentation. Utilize non-pharmacological interventions including frequent reorientation, maintaining day/night distinction (blinds closed during night, open during day), minimizing excessive stimulation, staff continuity, reorient the patient frequently, provide easily visible clock and calendar, provide sensory aids like glasses, hearing aids, encourage ambulation, regular activities and visitors to maintain cognitive  stimulation.  Reduce Risperdal 0.5 mg nightly Continue Klonopin 0.5 mg p.o. twice daily as needed See chart for agitation protocol with Haldol and Ativan.   Disposition: Recommend psychiatric Inpatient admission when medically cleared.  Has currently been denied 3 facilities, will initiate referral to [place], FNP 10/31/2022 5:43 PM

## 2022-10-31 NOTE — TOC Progression Note (Addendum)
Transition of Care [facility]) - Progression Note    Patient Details  Name: David Kim MRN: [id number] Date of Birth: December 15, 1945  [facility] CM/SW Contact  Johneric Mcfadden, Juliann Pulse, RN Phone Number: 10/31/2022, 10:48 AM  Clinical Narrative: Denied by 3 IP Gero/Psych places-[facility], [facility]. Placed on [facility] list-faxed w/confirmation/& spoke to [facility] in intake fax#[phone]/tel#[phone].Await outcome. 11:53a-Received message from [facility] she will check if has availability.     Expected Discharge Plan:  (TBD) Barriers to Discharge: Continued Medical Work up  Expected Discharge Plan and Services   Discharge Planning Services: CM Consult   Living arrangements for the past 2 months: Single Family Home                                       Social Determinants of Health (SDOH) Interventions SDOH Screenings   Food Insecurity: No Food Insecurity (10/20/2022)  Housing: Low Risk  (10/20/2022)  Transportation Needs: No Transportation Needs (10/20/2022)  Utilities: Not At Risk (10/20/2022)  Alcohol Screen: Low Risk  (07/24/2022)  Depression (PHQ2-9): Low Risk  (04/05/2022)  Financial Resource Strain: Low Risk  (07/24/2022)  Physical Activity: Inactive (07/24/2022)  Social Connections: Moderately Isolated (07/24/2022)  Stress: No Stress Concern Present (07/24/2022)  Tobacco Use: Low Risk  (10/20/2022)    Readmission Risk Interventions    10/27/2022   10:19 AM  Readmission Risk Prevention Plan  Transportation Screening Complete  PCP or Specialist Appt within 5-7 Days Complete  Home Care Screening Complete  Medication Review (RN CM) Complete

## 2022-11-01 DIAGNOSIS — T50902A Poisoning by unspecified drugs, medicaments and biological substances, intentional self-harm, initial encounter: Secondary | ICD-10-CM | POA: Diagnosis not present

## 2022-11-01 MED ORDER — RISPERIDONE 0.5 MG PO TABS
0.5000 mg | ORAL_TABLET | Freq: Every day | ORAL | Status: DC
  Administered 2022-11-01 – 2022-11-06 (×6): 0.5 mg via ORAL
  Filled 2022-11-01 (×6): qty 1

## 2022-11-01 NOTE — Progress Notes (Signed)
Family ([name] and [employer] ) called. Returned call at 2100. Concerns shared from family regarding plans for discharge. Family request that attending MD  call Ellery Plunk - Brother in law  of patient be called today regarding status and plans for discharge at [phone]. This information will be passed on to attending day shift nurse.

## 2022-11-01 NOTE — Progress Notes (Signed)
Consultation Progress Note   Patient: David Kim DOB: [birth year] DOA: 10/15/2022 DOS: the patient was seen and examined on 11/01/2022 Primary service: DibiaManfred Shirts, MD  Brief hospital course: David Kim is a 76 yo male with PMH paraplegia, severe MDD, persistent adjustment disorder with mixed anxiety and depressed mood ,PUD, gout, HTN, IBS, OSA, prostate cancer, GERD.  He was recently evaluated under IVC last month after being depressed and expressing suicidal ideation.  He was treated by psychiatry on their service and cleared for discharge home with decrease in his Klonopin dose on 11/13.  Patient presented to [facility] emergency department after home health aide discovered patient took an unknown amount of pills.    During the patient's emergency department evaluation, discussions with EMS revealed there were opened bottles of losartan, tizanidine, oxybutynin, baclofen, and clonazepam.  Upon initial interview the patient endorsed at least taking clonazepam and tizanidine but would not provide additional information.  Initial evaluation also revealed multiple SIRS criteria with fever of 103.2 and urinalysis suggestive of urinary tract infection prompting initiation of intravenous ceftriaxone.  Patient was initially hospitalized in the stepdown unit for suspected drug overdose with initial concern for sepsis secondary to urinary tract infection.  Urine culture came back positive for pansensitive E. coli several days after hospitalization patient was also identified to have persisting left lower lobe infiltrate on 12/14 thought to be due to concurrent pneumonia.  Antibiotics were therefore broadened to cefepime and vancomycin on 12/14.  Clinical evidence of SIRS/sepsis resolved in the days that followed and course of therapy completed early in the morning on 12/19.  After several days of withholding all of the suspected overdosed agents patient began to exhibit extreme  agitation tremulousness and hallucinations concerning for benzodiazepine withdrawal.  Patient was then placed back on scheduled benzodiazepines and baclofen on 12/13.    From 12/13 until 12/19 patient's mentation gradually improved. Degree of agitation and confusion also gradually began to improve.  Extensive encephalopathy workup was undertaken including obtaining CT head, MRI brain and EEG all of which did not identify a contributing cause.  Psychiatry was also consulted on admission however their assessments have been obscured by ongoing encephalopathy felt to be due to both infection and drug overdose.  Due to guarded prognosis palliative care consultation was additionally obtained.   Assessment and Plan: * Intentional overdose ([place]) Suspected intentional overdose, found to have open bottles of losartan, tizanidine, oxybutynin, baclofen and clonazepam by EMS.  Quantity is unclear.   Originally, all of patient's home medications were initially held due to the overdose  Patient was then resumed on benzodiazepines and baclofen on 12/13 due to concerns for withdrawal and have been slowly tapered since Continue sitter one-to-one safety precautions Will place IVC paperwork if patient attempts to elope. Psychiatry following, is assisting with management of delirium.  Their assistance is appreciated. Monitoring closely with serial labs and ongoing telemetry monitoring  Sepsis ([place]) Sepsis thought to be secondary to E. coli urinary tract infection on admission.  Then, several days later  left lower lobe infiltrate noted on chest x-ray 12/14 as an additional source. Antibiotic regimen was expanded from ceftriaxone monotherapy to cefepime and vancomycin on 12/14.  Antibiotics completed early the morning of 12/19. SIRS have resolved Following repeat blood cultures discontinuing intravenous fluids today.  Acute hypoxemic respiratory failure (HCC)-resolved as of 10/16/2022 - suspect in setting of  OD - no overt signs of aspiration of loss of consciousness - has been weaned back to  RA  Toxic metabolic encephalopathy Patient continues to exhibit confusion, episodes of possible myoclonus and visual hallucinations. Multifactorial.  Multiple contributing causes including underlying infection (resolved), drug overdose (resolved) and subsequent withdrawal (also resolved). Case discussed with Dr. Lorrin Goodell with Neurology today (12/19) to review what may be causing patient's encephalopathy to persist.  He recommends waiting 48 hours after the last cefepime dose to see if this helps (although patient was encephalopathic prior to the cefepime), keeping the patient on modest regimens of baclofen '10mg'$  twice daily and Clonazepam 0.'5mg'$  BID and obtaining a repeat EEG.   Seemingly slow clinical improvement overall although patient seems to have bouts of acute worsening MRI brain unremarkable Recent TSH, vitamin B12 unremarkable. EEG obtained on 12/15 without obvious seizure activity although this was substantially obscured by ongoing benzodiazepine use. Supportive care monitor for continued slow improvement.   GIB (gastrointestinal bleeding) Episode of bright red blood per rectum on 12/12 followed by additional small episode morning of 12/13  Patient's status post 2 unit packed blood cell transfusion on 12/12.   GI Consulted, they felt  that susspected etiology is due to stercoral colitis with ulceration No evidence of GI bleeding since with stable hemoglobin and hematocrit.    Bacteremia due to Escherichia coli Antibiotic course completed early the morning of 12/19.  Stercoral colitis history of severe constipation.   In the past, patient's wife was primarily involved in performing regular rectal stimulation and disimpactions prior to her death Aggressive laxative regimen ordered with MiraLAX senna and Colace with patient now moving his bowels regularly CT imaging suggestive of stercoral  colitis  Given his underlying paraplegia, suspect he has neurogenic bowel Disimpactions as needed  Metabolic acidosis Etiology suspected due to overdose Resolved  Severe recurrent major depression [facility]) Psychiatry following although patient is still suffering from some element of encephalopathy.  Their input is appreciated. Had a long discussion with patient's niece, [name] on 12/12 and again today (12/19).  Patient has become severely depressed since his wife passed away in 09/13/2023.  She was essentially the patient's sole caretaker and he was completely dependent on her.  Since her passing, he has began "shutting down"  Pressure injury of sacral region, stage 2 ([place]) Present on admission Daily dressing changes Frequent turns  Essential hypertension Titrating amlodipine to 10 mg once daily PRN intravenous antihypertensives for excessively elevated blood pressure   Paraplegia (HCC) Frequent turns to minimize development of pressure ulcerations [place] cautions  GERD Continue oral Protonix  Hypokalemia Replaced   Goals of care, counseling/discussion Goals of care discussion 12/12 with [name] patient's niece along with multiple other family members. No one has power of attorney.  Decision making for patient's care has been shared between the patient's son-in-law, daughter-in-law and niece. discussed goals of care with niece and family on 12/14.  They are well aware of the patient's guarded prognosis.   Patient is currently DNR Palliative care has been consulted and is following along, their assistance is appreciated.  Elevated liver enzymes - possibly in setting of sepsis vs OD - continue IVF and trend LFTs        TRH will continue to follow the patient.  Subjective: No complaints this morning. Psych has seen patient and recommends inpatient psych. Referrals have been sent out  Physical Exam: Vitals:   10/31/22 0500 10/31/22 1308 11/01/22 0103 11/01/22 0657   BP: 130/64 122/71 127/84 131/76  Pulse: 97 (!) 109 95 97  Resp: '18 17 18 20  '$ Temp: 98.4 F (  36.9 C) 98.7 F (37.1 C) 97.9 F (36.6 C) 98.2 F (36.8 C)  TempSrc: Oral Oral Oral Oral  SpO2: 97% 96% 99% 94%  Weight:      Height:      Appears in no acute distress S1-S2, regular, no murmur auscultated Lungs clear to auscultation bilaterally  Data Reviewed:  There are no new results to review at this time. Family Communication:   Time spent: 15 minutes.  Author: Cristela Felt, MD 11/01/2022 10:35 AM  For on call review [email or web address].

## 2022-11-01 NOTE — TOC Progression Note (Signed)
Transition of Care [facility]) - Progression Note    Patient Details  Name: David Kim MRN: [id number] Date of Birth: [birth year]  Transition of Care [facility]) CM/SW Contact  Lyan Moyano, Juliann Pulse, RN Phone Number: 11/01/2022, 12:36 PM  Clinical Narrative:TC [facility] rep [place]-still on wait list awaiting their MD to review.       Expected Discharge Plan: Psychiatric Hospital Barriers to Discharge: Continued Medical Work up  Expected Discharge Plan and Services   Discharge Planning Services: CM Consult   Living arrangements for the past 2 months: [place] Determinants of Health (SDOH) Interventions SDOH Screenings   Food Insecurity: No Food Insecurity (10/20/2022)  Housing: Low Risk  (10/20/2022)  Transportation Needs: No Transportation Needs (10/20/2022)  Utilities: Not At Risk (10/20/2022)  Alcohol Screen: Low Risk  (07/24/2022)  Depression (PHQ2-9): Low Risk  (04/05/2022)  Financial Resource Strain: Low Risk  (07/24/2022)  Physical Activity: Inactive (07/24/2022)  Social Connections: Moderately Isolated (07/24/2022)  Stress: No Stress Concern Present (07/24/2022)  Tobacco Use: Low Risk  (10/20/2022)    Readmission Risk Interventions    10/27/2022   10:19 AM  Readmission Risk Prevention Plan  Transportation Screening Complete  PCP or Specialist Appt within 5-7 Days Complete  Home Care Screening Complete  Medication Review (RN CM) Complete

## 2022-11-01 NOTE — Consult Note (Cosign Needed Addendum)
[place] Psychiatry Consult   Reason for Consult:  Suicide attempt Referring Physician:  Dr. Wandra Feinstein  Patient Identification: David Kim MRN:  [id number] Principal Diagnosis: Intentional overdose [facility]) Diagnosis:  Principal Problem:   Intentional overdose ([place]) Active Problems:   Severe recurrent major depression ([place])   Paraplegia ([place])   Essential hypertension   GERD   Toxic metabolic encephalopathy   Goals of care, counseling/discussion   Sepsis ([place])   Metabolic acidosis   Bacteremia due to Escherichia coli   Pressure injury of sacral region, stage 2 ([place])   Stercoral colitis   GIB (gastrointestinal bleeding)   Hypokalemia   Stercoral ulcer of rectum   Abnormal finding on GI tract imaging   Acute blood loss anemia   Chronic constipation   Total Time spent with patient: 15 minutes  Subjective:   David Kim is a 76 y.o. male seen and evaluated for the face.  He presents with a bright and pleasant affect.  Denying suicidal or homicidal ideations.  Denies auditory or visual hallucinations.  Sitter at bedside.  Patient reports he is in pretty good spirits. "  I do not want to leave the hospital, I am surrounded by good people."  Chart reviewed,  per previous assessment note, patient's  to be placed on a slow taper off Risperdal. He is currently prescribed Risperdal 1 mg will adjust to Risperdal 0.5 mg nightly.  Jostin has been referred to [facility] due to multiple denials from local behavioral health facilities.    Taichi reports he has been taking and tolerating medications well.  He reports a good appetite.  States he is resting well throughout the night.  States he continues to struggle with mild depression.  Rates his depression 4 out of 10 with 10 being the worst today.During evaluation David Kim is sitting upright in bed in no acute distress.  Observed eating his lunch. he is alert/oriented x 4; calm/cooperative; and mood congruent with affect.  he  is speaking in a clear tone at moderate volume, and normal pace; with good eye contact. His thought process is coherent and relevant; There is no indication that he is currently responding to internal/external stimuli or experiencing delusional thought content; and he has denied suicidal/self-harm/homicidal ideation, psychosis, and paranoia. Patient has remained calm throughout assessment and has answered questions appropriately.    HPI:  Per initial admission assessment note:  "Mr. Rosengrant is a 76 yo male with PMH paraplegia, severe MDD, persistent adjustment disorder with mixed anxiety and depressed mood ,PUD, gout, HTN, IBS, OSA, prostate cancer, GERD. He was recently evaluated under IVC last month after being depressed and expressing suicidal thoughts.  He was evaluated by psychiatry and cleared for discharge home with decrease in his Klonopin dose.  Patient presented to [facility] emergency department after home health aide discovered patient took an unknown amount of pills."  Past Psychiatric History:   Risk to Self:   Risk to Others:   Prior Inpatient Therapy:   Prior Outpatient Therapy:    Past Medical History:  Past Medical History:  Diagnosis Date   Allergic rhinitis    ALLERGIC RHINITIS 10/01/2007   Qualifier: Diagnosis of  By: Jenny Reichmann MD, Hunt Oris    Anxiety    Carpal tunnel syndrome    right   Depression    Depression    Erectile dysfunction    Fatigue    GERD (gastroesophageal reflux disease)    Gout    Hypertension  HYPOGONADISM 03/31/2008   Qualifier: Diagnosis of  By: Jenny Reichmann MD, Hunt Oris    IBS (irritable bowel syndrome)    Low back pain    OSA (obstructive sleep apnea)    Paraplegia ([place]) 04/23/2009   Qualifier: Diagnosis of  By: Jenny Reichmann MD, Hunt Oris    Peptic ulcer disease    PEPTIC ULCER DISEASE 10/01/2007   Qualifier: Diagnosis of  By: Jenny Reichmann MD, Hunt Oris    Prostate cancer [facility])    PROSTATE CANCER, HX OF 05/23/2007   Qualifier: Diagnosis of  By: Jenny Reichmann MD, [place] PARALYSIS 04/23/2009   Qualifier: Diagnosis of  By: Jenny Reichmann MD, Hunt Oris    Thoracic spinal cord injury ([place]) 03/12/2012    Past Surgical History:  Procedure Laterality Date   ARTERY AND TENDON REPAIR Left 09/06/2018   Procedure: ARTERY AND TENDON REPAIR;  Surgeon: Charlotte Crumb, MD;  Location: [place];  Service: Orthopedics;  Laterality: Left;   CAST APPLICATION Left 78/12/9560   Procedure: CAST APPLICATION;  Surgeon: Charlotte Crumb, MD;  Location: [place];  Service: Orthopedics;  Laterality: Left;   inguinal heniorrhaphy     left leg surgery after fibula     NERVE REPAIR Left 09/06/2018   Procedure: NERVE REPAIR TIMES TWO;  Surgeon: Charlotte Crumb, MD;  Location: [place];  Service: Orthopedics;  Laterality: Left;   OPEN REDUCTION INTERNAL FIXATION (ORIF) DISTAL PHALANX Left 09/06/2018   Procedure: OPEN REDUCTION INTERNAL FIXATION (ORIF) LEFT LONG FINGER;  Surgeon: Charlotte Crumb, MD;  Location: [place];  Service: Orthopedics;  Laterality: Left;   PILONIDAL CYST / SINUS EXCISION     PROSTATECTOMY     tonsillectomy     WOUND EXPLORATION Left 09/06/2018   Procedure: EXPLORATION OFCOMPLEX INJURY;  Surgeon: Charlotte Crumb, MD;  Location: [place];  Service: Orthopedics;  Laterality: Left;   Family History:  Family History  Problem Relation Age of Onset   High blood pressure Mother    Dementia Mother    Diabetes Father    Family Psychiatric  History:  Social History:  Social History   Substance and Sexual Activity  Alcohol Use No     Social History   Substance and Sexual Activity  Drug Use No    Social History   Socioeconomic History   Marital status: Married    Spouse name: [name]   Number of children: 0   Years of education: 12   Highest education level: Not on file  Occupational History   Occupation: retired    Fish farm manager: RETIRED  Tobacco Use   Smoking status: Never   Smokeless tobacco: Never  Vaping Use   Vaping Use: Never used  Substance and Sexual  Activity   Alcohol use: No   Drug use: No   Sexual activity: Not on file  Other Topics Concern   Not on file  Social History Narrative   Patient lives at home with [name]). Patient is disabled. Patient has 12 th grade education.   Right handed.   Caffeine- None   Social Determinants of Health   Financial Resource Strain: Low Risk  (07/24/2022)   Overall Financial Resource Strain (CARDIA)    Difficulty of Paying Living Expenses: Not hard at all  Food Insecurity: No Food Insecurity (10/20/2022)   Hunger Vital Sign    Worried About Running Out of Food in the Last Year: Never true    Ran Out of Food in the Last Year: Never true  Transportation Needs:  No Transportation Needs (10/20/2022)   PRAPARE - Hydrologist (Medical): No    Lack of Transportation (Non-Medical): No  Physical Activity: Inactive (07/24/2022)   Exercise Vital Sign    Days of Exercise per Week: 0 days    Minutes of Exercise per Session: 0 min  Stress: No Stress Concern Present (07/24/2022)   [place]    Feeling of Stress : Not at all  Social Connections: Moderately Isolated (07/24/2022)   Social Connection and Isolation Panel [NHANES]    Frequency of Communication with Friends and Family: More than three times a week    Frequency of Social Gatherings with Friends and Family: More than three times a week    Attends Religious Services: Never    Marine scientist or Organizations: No    Attends Archivist Meetings: Never    Marital Status: Married   Additional Social History:    Allergies:   Allergies  Allergen Reactions   Amoxicillin Other (See Comments)    Unknown    Endal Hd Other (See Comments)    Unknown     Labs: No results found for this or any previous visit (from the past 48 hour(s)).  Current Facility-Administered Medications  Medication Dose Route Frequency Provider Last Rate  Last Admin   0.9 %  sodium chloride infusion   Intravenous PRN Shalhoub, Sherryll Burger, MD       acetaminophen (TYLENOL) tablet 650 mg  650 mg Oral Q6H PRN Dwyane Dee, MD   650 mg at 10/17/22 6767   Or   acetaminophen (TYLENOL) suppository 650 mg  650 mg Rectal Q6H PRN Dwyane Dee, MD   650 mg at 10/17/22 1526   amLODipine (NORVASC) tablet 10 mg  10 mg Oral Daily Vernelle Emerald, MD   10 mg at 11/01/22 0855   baclofen (LIORESAL) tablet 10 mg  10 mg Oral BID Vernelle Emerald, MD   10 mg at 11/01/22 0855   clonazePAM (KLONOPIN) tablet 0.5 mg  0.5 mg Oral BID Vernelle Emerald, MD   0.5 mg at 11/01/22 2094   docusate sodium (COLACE) capsule 100 mg  100 mg Oral BID Mansouraty, Telford Nab., MD   100 mg at 11/01/22 0855   enoxaparin (LOVENOX) injection 40 mg  40 mg Subcutaneous Q24H Dwyane Dee, MD   40 mg at 11/01/22 0900   haloperidol (HALDOL) tablet 2 mg  2 mg Oral BID PRN Cinderella, Margaret A       And   LORazepam (ATIVAN) tablet 1 mg  1 mg Oral BID PRN Cinderella, Margaret A       hydrALAZINE (APRESOLINE) injection 10 mg  10 mg Intravenous Q6H PRN Shalhoub, Sherryll Burger, MD       leptospermum manuka honey ([place]) paste 1 Application  1 Application Topical Daily Dwyane Dee, MD   1 Application at 70/96/28 0857   melatonin tablet 10 mg  10 mg Oral QHS Vernelle Emerald, MD   10 mg at 10/31/22 2200   Oral care mouth rinse  15 mL Mouth Rinse PRN Shalhoub, Sherryll Burger, MD       pantoprazole (PROTONIX) EC tablet 40 mg  40 mg Oral Daily Vernelle Emerald, MD   40 mg at 11/01/22 0855   polyethylene glycol (MIRALAX / GLYCOLAX) packet 17 g  17 g Oral BID E[place], Amy S, PA-C   17 g at 10/31/22 2201   polyvinyl  alcohol (LIQUIFILM TEARS) 1.4 % ophthalmic solution 1 drop  1 drop Both Eyes PRN Dwyane Dee, MD   1 drop at 10/17/22 2229   potassium chloride SA (KLOR-CON M) CR tablet 40 mEq  40 mEq Oral Once [place], Marge Duncans, MD       risperiDONE (RISPERDAL) tablet 1 mg  1 mg Oral QHS Derrill Center, NP   1 mg at 10/31/22 2201   senna-docusate (Senokot-S) tablet 1 tablet  1 tablet Oral BID Dwyane Dee, MD   1 tablet at 11/01/22 0855   tiZANidine (ZANAFLEX) tablet 4 mg  4 mg Oral Q6H PRN Vernelle Emerald, MD   4 mg at 10/25/22 0845    Psychiatric Specialty Exam:  Presentation  General Appearance:  Appropriate for Environment; Casual  Eye Contact: Good  Speech: Clear and Coherent; Normal Rate  Speech Volume: Normal  Handedness: Right   Mood and Affect  Mood: Depressed  Affect: Congruent   Thought Process  Thought Processes: Coherent; Linear  Descriptions of Associations:Intact  Orientation:Full (Time, Place and Person)  Thought Content:WDL  History of Schizophrenia/Schizoaffective disorder:No  Duration of Psychotic Symptoms:No data recorded Hallucinations:No data recorded Ideas of Reference:None  Suicidal Thoughts:Suicidal Thoughts: No  Homicidal Thoughts:No data recorded  Sensorium  Memory: Immediate Fair; Remote Fair; Recent Fair; Immediate Poor  Judgment: Intact  Insight: Fair   Community education officer  Concentration: Good  Attention Span: Good  Recall: Good  Fund of Knowledge: Good  Language: Good   Psychomotor Activity  Psychomotor Activity:No data recorded  Assets  Assets: Communication Skills; Social Support; Catering manager   Sleep  Sleep:No data recorded  Physical Exam: Physical Exam Vitals and nursing note reviewed.  Neurological:     Mental Status: He is alert.  Psychiatric:        Mood and Affect: Mood normal.        Thought Content: Thought content normal.    Review of Systems  Psychiatric/Behavioral:  Positive for depression. Negative for suicidal ideas. The patient is nervous/anxious.    Blood pressure 131/76, pulse 97, temperature 98.2 F (36.8 C), temperature source Oral, resp. rate 20, height '5\' 8"'$  (1.727 m), weight 85.7 kg, SpO2 94 %. Body mass index is 28.73  kg/m.  Treatment Plan Summary: Daily contact with patient to assess and evaluate symptoms and progress in treatment and Medication management  Reduce Risperdal 01 mg to 0.5 mg nightly- Adjustment on 11/01/2022 Continue Klonopin 0.5 mg p.o. twice daily as needed See chart for agitation protocol with Haldol and Ativan.   Disposition: Recommend psychiatric Inpatient admission when medically cleared.  Derrill Center, NP 11/01/2022 12:46 PM

## 2022-11-01 NOTE — Progress Notes (Signed)
Occupational Therapy Treatment Patient Details Name: David Kim MRN: [id number] DOB: [birth year] Today's Date: 11/01/2022   History of present illness David Kim is a 76 yo male with PMH paraplegia, severe MDD, persistent adjustment disorder with mixed anxiety and depressed mood ,PUD, gout, HTN, IBS, OSA, prostate cancer, GERD.   He was recently evaluated under IVC last month for expressing suicidal ideation. he was cleared for discharge home on 11/13.  Patient presented to [facility] long ED after home health aide discovered patient took an unknown amount of pills on 10/15/22   OT comments  Patient incontinent of BM x 2 and required total care at bed level for pericare. Patient mod assist to transfer into sitting for LE and trunk and max assist with +2 for safety to transfer via slide board to chair. He had one anterior loss of balance that therapist corrected. Patient able to assist with upper extremities but is generally weak.    Recommendations for follow up therapy are one component of a multi-disciplinary discharge planning process, led by the attending physician.  Recommendations may be updated based on patient status, additional functional criteria and insurance authorization.    Follow Up Recommendations  Skilled nursing-short term rehab (<3 hours/day)     Assistance Recommended at Discharge Frequent or constant Supervision/Assistance  Patient can return home with the following  A lot of help with walking and/or transfers;A lot of help with bathing/dressing/bathroom;Assistance with cooking/housework;Direct supervision/assist for medications management;Direct supervision/assist for financial management;Assist for transportation;Help with stairs or ramp for entrance   Equipment Recommendations  None recommended by OT    Recommendations for Other Services      Precautions / Restrictions Precautions Precautions: Fall Precaution Comments: paraplegia T12 ,  incontinence Restrictions Weight Bearing Restrictions: No Other Position/Activity Restrictions: L LE spastic/R LE flaccid          Balance Overall balance assessment: Needs assistance Sitting-balance support: No upper extremity supported, Feet unsupported Sitting balance-Leahy Scale: Poor Sitting balance - Comments: Fair statically, one loss of balance with scooting                                   ADL either performed or assessed with clinical judgement   ADL Overall ADL's : Needs assistance/impaired                             Toileting- Clothing Manipulation and Hygiene: Total assistance;Bed level Toileting - Clothing Manipulation Details (indicate cue type and reason): incontinence x 2 in bed requiring total care. patient assisted wtih rolling bed, depends donned     Functional mobility during ADLs: Maximal assistance General ADL Comments: After perianal care x 2 -donned Diaper. Patient max assist to transfer to recliner wtih slide board. One anterior loss of balance that therapist corrected. Patient assisted with upper extremities but generally weak.    Extremity/Trunk Assessment Upper Extremity Assessment Upper Extremity Assessment: Overall WFL for tasks assessed   Lower Extremity Assessment Lower Extremity Assessment: Defer to PT evaluation        Vision   Vision Assessment?: No apparent visual deficits   Perception     Praxis      Cognition Arousal/Alertness: Awake/alert Behavior During Therapy: WFL for tasks assessed/performed Overall Cognitive Status: Within Functional Limits for tasks assessed  Following Commands: Follows one step commands consistently                           Pertinent Vitals/ Pain       Pain Assessment Pain Assessment: No/denies pain   Frequency  Min 2X/week        Progress Toward Goals  OT Goals(current goals can now be found in the care plan  section)  Progress towards OT goals: Progressing toward goals  Acute Rehab OT Goals OT Goal Formulation: With patient Time For Goal Achievement: 11/08/22 Potential to Achieve Goals: [place] Discharge plan remains appropriate    Co-evaluation        PT goals addressed during session: Mobility/safety with mobility        AM-PAC OT "6 Clicks" Daily Activity     Outcome Measure   Help from another person eating meals?: None Help from another person taking care of personal grooming?: A Little Help from another person toileting, which includes using toliet, bedpan, or urinal?: Total Help from another person bathing (including washing, rinsing, drying)?: A Lot Help from another person to put on and taking off regular upper body clothing?: A Little Help from another person to put on and taking off regular lower body clothing?: A Lot 6 Click Score: 15    End of Session    OT Visit Diagnosis: Other symptoms and signs involving cognitive function   Activity Tolerance Patient tolerated treatment well   Patient Left with call bell/phone within reach;with nursing/sitter in room;in chair   Nurse Communication Mobility status        Time: 0263-7858 OT Time Calculation (min): 22 min  Charges: OT General Charges $OT Visit: 1 Visit OT Treatments $Therapeutic Activity: 8-22 mins  Gustavo Lah, OTR/L [place]  Office [phone]   Lenward Chancellor 11/01/2022, 3:48 PM

## 2022-11-02 DIAGNOSIS — T50902A Poisoning by unspecified drugs, medicaments and biological substances, intentional self-harm, initial encounter: Secondary | ICD-10-CM | POA: Diagnosis not present

## 2022-11-02 LAB — BASIC METABOLIC PANEL
Anion gap: 9 (ref 5–15)
BUN: 42 mg/dL — ABNORMAL HIGH (ref 8–23)
CO2: 25 mmol/L (ref 22–32)
Calcium: 8.7 mg/dL — ABNORMAL LOW (ref 8.9–10.3)
Chloride: 106 mmol/L (ref 98–111)
Creatinine, Ser: 0.6 mg/dL — ABNORMAL LOW (ref 0.61–1.24)
GFR, Estimated: >60 mL/min (ref >60)
Glucose, Bld: 123 mg/dL — ABNORMAL HIGH (ref 70–99)
Potassium: 3.1 mmol/L — ABNORMAL LOW (ref 3.5–5.1)
Sodium: 140 mmol/L (ref 135–145)

## 2022-11-02 MED ORDER — POTASSIUM CHLORIDE 10 MEQ/100ML IV SOLN
10.0000 meq | INTRAVENOUS | Status: AC
  Administered 2022-11-02 (×4): 10 meq via INTRAVENOUS
  Filled 2022-11-02 (×4): qty 100

## 2022-11-02 NOTE — Progress Notes (Signed)
Consultation Progress Note   Patient: David Kim HBZ:[id number] DOB: 10/25/1946 DOA: 10/15/2022 DOS: the patient was seen and examined on 11/02/2022 Primary service: David Shirts, MD  Brief hospital course: David Kim is a 76 yo male with PMH paraplegia, severe MDD, persistent adjustment disorder with mixed anxiety and depressed mood ,PUD, gout, HTN, IBS, OSA, prostate cancer, GERD.  He was recently evaluated under IVC last month after being depressed and expressing suicidal ideation.  He was treated by psychiatry on their service and cleared for discharge home with decrease in his Klonopin dose on 11/13.  Patient presented to [facility] emergency department after home health aide discovered patient took an unknown amount of pills.    During the patient's emergency department evaluation, discussions with EMS revealed there were opened bottles of losartan, tizanidine, oxybutynin, baclofen, and clonazepam.  Upon initial interview the patient endorsed at least taking clonazepam and tizanidine but would not provide additional information.  Initial evaluation also revealed multiple SIRS criteria with fever of 103.2 and urinalysis suggestive of urinary tract infection prompting initiation of intravenous ceftriaxone.  Patient was initially hospitalized in the stepdown unit for suspected drug overdose with initial concern for sepsis secondary to urinary tract infection.  Urine culture came back positive for pansensitive E. coli several days after hospitalization patient was also identified to have persisting left lower lobe infiltrate on 12/14 thought to be due to concurrent pneumonia.  Antibiotics were therefore broadened to cefepime and vancomycin on 12/14.  Clinical evidence of SIRS/sepsis resolved in the days that followed and course of therapy completed early in the morning on 12/19.  After several days of withholding all of the suspected overdosed agents patient began to exhibit extreme  agitation tremulousness and hallucinations concerning for benzodiazepine withdrawal.  Patient was then placed back on scheduled benzodiazepines and baclofen on 12/13.    From 12/13 until 12/19 patient's mentation gradually improved. Degree of agitation and confusion also gradually began to improve.  Extensive encephalopathy workup was undertaken including obtaining CT head, MRI brain and EEG all of which did not identify a contributing cause.  Psychiatry was also consulted on admission however their assessments have been obscured by ongoing encephalopathy felt to be due to both infection and drug overdose.  Due to guarded prognosis palliative care consultation was additionally obtained.   Assessment and Plan: * Intentional overdose ([place]) Suspected intentional overdose, found to have open bottles of losartan, tizanidine, oxybutynin, baclofen and clonazepam by EMS.  Quantity is unclear.   Originally, all of patient's home medications were initially held due to the overdose  Patient was then resumed on benzodiazepines and baclofen on 12/13 due to concerns for withdrawal and have been slowly tapered since Continue sitter one-to-one safety precautions Will place IVC paperwork if patient attempts to elope. Psychiatry following, is assisting with management of delirium.  Their assistance is appreciated. Monitoring closely with serial labs and ongoing telemetry monitoring  Sepsis ([place]) Sepsis thought to be secondary to E. coli urinary tract infection on admission.  Then, several days later  left lower lobe infiltrate noted on chest x-ray 12/14 as an additional source. Antibiotic regimen was expanded from ceftriaxone monotherapy to cefepime and vancomycin on 12/14.  Antibiotics completed early the morning of 12/19. SIRS have resolved Following repeat blood cultures discontinuing intravenous fluids today.  Acute hypoxemic respiratory failure (HCC)-resolved as of 10/16/2022 - suspect in setting of  OD - no overt signs of aspiration of loss of consciousness - has been weaned back to  RA  Toxic metabolic encephalopathy Patient continues to exhibit confusion, episodes of possible myoclonus and visual hallucinations. Multifactorial.  Multiple contributing causes including underlying infection (resolved), drug overdose (resolved) and subsequent withdrawal (also resolved). Case discussed with David Kim with Neurology today (12/19) to review what may be causing patient's encephalopathy to persist.  He recommends waiting 48 hours after the last cefepime dose to see if this helps (although patient was encephalopathic prior to the cefepime), keeping the patient on modest regimens of baclofen '10mg'$  twice daily and Clonazepam 0.'5mg'$  BID and obtaining a repeat EEG.   Seemingly slow clinical improvement overall although patient seems to have bouts of acute worsening MRI brain unremarkable Recent TSH, vitamin B12 unremarkable. EEG obtained on 12/15 without obvious seizure activity although this was substantially obscured by ongoing benzodiazepine use. Supportive care monitor for continued slow improvement.   GIB (gastrointestinal bleeding) Episode of bright red blood per rectum on 12/12 followed by additional small episode morning of 12/13  Patient's status post 2 unit packed blood cell transfusion on 12/12.   GI Consulted, they felt  that susspected etiology is due to stercoral colitis with ulceration No evidence of GI bleeding since with stable hemoglobin and hematocrit.    Bacteremia due to Escherichia coli Antibiotic course completed early the morning of 12/19.  Stercoral colitis history of severe constipation.   In the past, patient's wife was primarily involved in performing regular rectal stimulation and disimpactions prior to her death Aggressive laxative regimen ordered with MiraLAX senna and Colace with patient now moving his bowels regularly CT imaging suggestive of stercoral  colitis  Given his underlying paraplegia, suspect he has neurogenic bowel Disimpactions as needed  Metabolic acidosis Etiology suspected due to overdose Resolved  Severe recurrent major depression [facility]) Psychiatry following although patient is still suffering from some element of encephalopathy.  Their input is appreciated. Had a long discussion with patient's niece, [name] on 12/12 and again today (12/19).  Patient has become severely depressed since his wife passed away in September 02, 2023.  She was essentially the patient's sole caretaker and he was completely dependent on her.  Since her passing, he has began "shutting down"  Pressure injury of sacral region, stage 2 ([place]) Present on admission Daily dressing changes Frequent turns  Essential hypertension Titrating amlodipine to 10 mg once daily PRN intravenous antihypertensives for excessively elevated blood pressure   Paraplegia (HCC) Frequent turns to minimize development of pressure ulcerations Fall cautions  GERD Continue oral Protonix  Hypokalemia Replaced   Goals of care, counseling/discussion Goals of care discussion 12/12 with [name] patient's niece along with multiple other family members. No one has power of attorney.  Decision making for patient's care has been shared between the patient's son-in-law, daughter-in-law and niece. discussed goals of care with niece and family on 12/14.  They are well aware of the patient's guarded prognosis.   Patient is currently DNR Palliative care has been consulted and is following along, their assistance is appreciated.  Elevated liver enzymes - possibly in setting of sepsis vs OD - continue IVF and trend LFTs        TRH will continue to follow the patient.  Subjective: No complaints this morningPsych has seen patient and recommends inpatient psych. Referrals have been sent out   Physical Exam: Vitals:   11/01/22 0657 11/01/22 1434 11/01/22 2051 11/02/22 0624   BP: 131/76 126/77 132/85 130/76  Pulse: 97 (!) 107 (!) 110 95  Resp: '20 18 18 20  '$ Temp: 98.2  F (36.8 C) 98.1 F (36.7 C) 98.2 F (36.8 C) 99.2 F (37.3 C)  TempSrc: Oral Oral Oral   SpO2: 94% 95% 95% 96%  Weight:      Height:        Data Reviewed:  There are no new results to review at this time.  Family Communication: Sister  Time spent: 15 minutes.  Author: Cristela Felt, MD 11/02/2022 10:38 AM  For on call review [email or web address].

## 2022-11-02 NOTE — TOC Progression Note (Addendum)
Transition of Care [facility]) - Progression Note    Patient Details  Name: David Kim MRN: [id number] Date of Birth: 1946/06/28  [facility]) CM/SW Contact  Keelynn Furgerson, Juliann Pulse, RN Phone Number: 11/02/2022, 1:57 PM  Clinical Narrative: Per Psych request referral for APS made for guardianship d/t danger to himself,& home safety concerns. Noted family has tried APS for guardianship in past see TOC note on 12/21. -3:32p-Left vm-APS for guardianship-Lanae Anderson [phone]-await outcome.    Expected Discharge Plan: Psychiatric Hospital Barriers to Discharge: Continued Medical Work up  Expected Discharge Plan and Services   Discharge Planning Services: CM Consult   Living arrangements for the past 2 months: [place] Determinants of Health (SDOH) Interventions SDOH Screenings   Food Insecurity: No Food Insecurity (10/20/2022)  Housing: Low Risk  (10/20/2022)  Transportation Needs: No Transportation Needs (10/20/2022)  Utilities: Not At Risk (10/20/2022)  Alcohol Screen: Low Risk  (07/24/2022)  Depression (PHQ2-9): Low Risk  (04/05/2022)  Financial Resource Strain: Low Risk  (07/24/2022)  Physical Activity: Inactive (07/24/2022)  Social Connections: Moderately Isolated (07/24/2022)  Stress: No Stress Concern Present (07/24/2022)  Tobacco Use: Low Risk  (10/20/2022)    Readmission Risk Interventions    10/27/2022   10:19 AM  Readmission Risk Prevention Plan  Transportation Screening Complete  PCP or Specialist Appt within 5-7 Days Complete  Home Care Screening Complete  Medication Review (RN CM) Complete

## 2022-11-02 NOTE — Care Management Important Message (Signed)
Important Message  Patient Details IM Letter given. Name: David Kim MRN: [id number] Date of Birth: 11/26/1945   Medicare Important Message Given:  Yes     Kerin Salen 11/02/2022, 2:38 PM

## 2022-11-02 NOTE — TOC Progression Note (Signed)
Transition of Care [facility]) - Progression Note    Patient Details  Name: David Kim MRN: [id number] Date of Birth: 1946-09-12  Transition of Care [facility]) CM/SW Contact  Deklin Bieler, Juliann Pulse, RN Phone Number: 11/02/2022, 10:16 AM  Clinical Narrative: Per request from [facility]) e faxed w/confirmation for updated MD notes/psych notes/GI cons/current xrays,scans/meds.Currently no IP Psych facility has accepted. See prior [facility] notes for additional info.     Expected Discharge Plan: Psychiatric Hospital Barriers to Discharge: Continued Medical Work up  Expected Discharge Plan and Services   Discharge Planning Services: CM Consult   Living arrangements for the past 2 months: [place] Determinants of Health (SDOH) Interventions SDOH Screenings   Food Insecurity: No Food Insecurity (10/20/2022)  Housing: Low Risk  (10/20/2022)  Transportation Needs: No Transportation Needs (10/20/2022)  Utilities: Not At Risk (10/20/2022)  Alcohol Screen: Low Risk  (07/24/2022)  Depression (PHQ2-9): Low Risk  (04/05/2022)  Financial Resource Strain: Low Risk  (07/24/2022)  Physical Activity: Inactive (07/24/2022)  Social Connections: Moderately Isolated (07/24/2022)  Stress: No Stress Concern Present (07/24/2022)  Tobacco Use: Low Risk  (10/20/2022)    Readmission Risk Interventions    10/27/2022   10:19 AM  Readmission Risk Prevention Plan  Transportation Screening Complete  PCP or Specialist Appt within 5-7 Days Complete  Home Care Screening Complete  Medication Review (RN CM) Complete

## 2022-11-02 NOTE — Consult Note (Signed)
  Psychiatric nurse practitioner reached out to [facility] regarding update for patient.  Family continues to be concerned about his overall safety in the event he were to discharge home.  Did discuss current recommendations for inpatient psychiatric hospitalization, however patient has been declined by multiple facilities including out of system facility.  At this present time he has been referred to [facility], as he remains a danger to his self.  There continue to be several barriers in place due to patient's medical complexity and ongoing psychiatric needs.  He is currently stable on current medication regimen, however as noted has multiple safety concerns and at high risk for suicide completion.  Further discussion was held regarding healthcare power of attorney, power of attorney, legal guardianship.  There was also discussion regarding capacity versus competency versus assenting.  [name] is advised that patient can estimate to power of attorney, and sign documents for such.  However patient does lack capacity to make decisions required for legal guardianship and safe disposition.  -Family reports initiating contact with [employer], however cost was calculated and $3650.  They are interested in pursuing legal guardianship and assisting patient with finances, ongoing stability, and admitting medical needs however unable to afford the calls.  We did discuss initiating an APS referralAs patient is in need of evaluating for protective services, planning for the disabled adult to prevent abuse, neglect, and exploitation.  Patient will require court action in order to protect the patient and keep him safe.  Patient does require essentially total care, and round-the-clock coverage.  He will benefit from long-term care facility, once psychiatrically stable.  The above information was relayed to current Education officer, museum on call [place].-  Patient continues to meet inpatient psychiatric  criteria at this time.  Psychiatric consult service will continue to follow at this time.  No medication adjustments will be made.  [facility]  May continue to work with family regarding guardianship and placement.  Will update and touch base next week, following the holidays.

## 2022-11-03 MED ORDER — POTASSIUM CHLORIDE CRYS ER 20 MEQ PO TBCR
20.0000 meq | EXTENDED_RELEASE_TABLET | Freq: Two times a day (BID) | ORAL | Status: DC
  Administered 2022-11-03 – 2022-11-17 (×28): 20 meq via ORAL
  Filled 2022-11-03 (×28): qty 1

## 2022-11-03 NOTE — Plan of Care (Signed)
  Problem: Health Behavior/Discharge Planning: Goal: Ability to manage health-related needs will improve Outcome: Progressing   Problem: Activity: Goal: Risk for activity intolerance will decrease Outcome: Progressing   Problem: Nutrition: Goal: Adequate nutrition will be maintained Outcome: Progressing   

## 2022-11-03 NOTE — Progress Notes (Signed)
Physical Therapy Treatment Patient Details Name: David Kim MRN: [id number] DOB: [birth year] Today's Date: 11/03/2022   History of Present Illness Mr. Kehl is a 76 yo male with PMH paraplegia, severe MDD, persistent adjustment disorder with mixed anxiety and depressed mood ,PUD, gout, HTN, IBS, OSA, prostate cancer, GERD.   He was recently evaluated under IVC last month for expressing suicidal ideation. he was cleared for discharge home on 11/13.  Patient presented to [facility] long ED after home health aide discovered patient took an unknown amount of pills on 10/15/22    PT Comments    Pt was in wheelchair about 20 min propelling self around 4 floor with safety sitter.  General transfer comment: used sliding board to transfer from wheelchair to drop arm recliner.  Pt required Max Asisst for "set up" (wc/recliner placment + sliding board placement) then was 50% self able to use B UE's to slide self slightly uphill into recliner.  Then positioned to comfort in recliner. Safety sitter present.  Prior to admit, pt was Indep with his sliding board transfers.  Pt will need ST Rehab at SNF to address mobility and functional decline.     Recommendations for follow up therapy are one component of a multi-disciplinary discharge planning process, led by the attending physician.  Recommendations may be updated based on patient status, additional functional criteria and insurance authorization.  Follow Up Recommendations  Skilled nursing-short term rehab (<3 hours/day) Can patient physically be transported by private vehicle: No   Assistance Recommended at Discharge Frequent or constant Supervision/Assistance  Patient can return home with the following A lot of help with walking and/or transfers;A lot of help with bathing/dressing/bathroom;Assist for transportation;Help with stairs or ramp for entrance   Equipment Recommendations  None recommended by PT    Recommendations for Other Services        Precautions / Restrictions Precautions Precautions: Fall Precaution Comments: paraplegia T12 , incontinence Restrictions Weight Bearing Restrictions: No Other Position/Activity Restrictions: L LE spastic/R LE flaccid     Mobility  Bed Mobility        General bed mobility comments: OOB in wheelchair    Transfers Overall transfer level: Needs assistance Equipment used: Sliding board               General transfer comment: used sliding board to transfer from wheelchair to drop arm recliner.  Pt required Max Asisst for "set up" (wc/recliner placment + sliding board placement) then was 50% self able to use B UE's to slide self slightly uphill into recliner.  Pt was Indep with sliding board transfers prior to admit.  Then positioned to comfort in recliner.    Ambulation/Gait               General Gait Details: NON amb   Theme park manager mobility: Yes Wheelchair parts: Supervision/cueing Distance: 500 feet + with a few resting breaks Wheelchair Assistance Details (indicate cue type and reason): NO VC's needed.  Pt fully capable to navigate around corners and perfom 180 turns.  Modified Rankin (Stroke Patients Only)       Balance                                            Cognition Arousal/Alertness: Awake/alert Behavior During Therapy: WFL for tasks assessed/performed  Overall Cognitive Status: Within Functional Limits for tasks assessed                                 General Comments: AxO x 3 cognition improving.  Been watching movies today with Air cabin crew.  Tearful a couple of times when discussed [name] (recently passed away)        Exercises      General Comments        Pertinent Vitals/Pain Pain Assessment Pain Assessment: No/denies pain    Home Living                          Prior Function            PT Goals  (current goals can now be found in the care plan section) Progress towards PT goals: Progressing toward goals    Frequency    Min 2X/week      PT Plan Current plan remains appropriate    Co-evaluation              AM-PAC PT "6 Clicks" Mobility   Outcome Measure  Help needed turning from your back to your side while in a flat bed without using bedrails?: A Lot Help needed moving from lying on your back to sitting on the side of a flat bed without using bedrails?: A Lot Help needed moving to and from a bed to a chair (including a wheelchair)?: A Lot Help needed standing up from a chair using your arms (e.g., wheelchair or bedside chair)?: Total Help needed to walk in hospital room?: Total Help needed climbing 3-5 steps with a railing? : Total 6 Click Score: 9    End of Session Equipment Utilized During Treatment: Gait belt Activity Tolerance: Patient tolerated treatment well Patient left: in chair;with nursing/sitter in room;with call bell/phone within reach Nurse Communication: Mobility status PT Visit Diagnosis: Other symptoms and signs involving the nervous system (R29.898)     Time: 7903-8333 PT Time Calculation (min) (ACUTE ONLY): 18 min  Charges:  $Therapeutic Activity: 8-22 mins                     Rica Koyanagi  PTA [place] Office M-F          [phone] Weekend pager [phone]

## 2022-11-03 NOTE — TOC Progression Note (Addendum)
Transition of Care [facility]) - Progression Note    Patient Details  Name: David Kim MRN: [id number] Date of Birth: 04/25/1946  Transition of [facility] CM/SW Contact  Katheline Brendlinger, Juliann Pulse, RN Phone Number: 11/03/2022, 10:32 AM  Clinical Narrative: Left another vm for APS guardianship rep [place] [phone] call back. See TOC prior notes for additional info.  -received call back from [facility] will email packet for start of petition-will provide to Leadership once received.Petition packet in shadow chart for Psych to complete any area to assist. -12:36p-request from [facility]-update needed:faxed w/confirmation psych note 12/26,12/28;MD note/TOC note-12/26,12/28.   Expected Discharge Plan: [facility] Hospital Barriers to Discharge: Continued Medical Work up  Expected Discharge Plan and Services   Discharge Planning Services: CM Consult   Living arrangements for the past 2 months: [place] Determinants of Health (SDOH) Interventions SDOH Screenings   Food Insecurity: No Food Insecurity (10/20/2022)  Housing: Low Risk  (10/20/2022)  Transportation Needs: No Transportation Needs (10/20/2022)  Utilities: Not At Risk (10/20/2022)  Alcohol Screen: Low Risk  (07/24/2022)  Depression (PHQ2-9): Low Risk  (04/05/2022)  Financial Resource Strain: Low Risk  (07/24/2022)  Physical Activity: Inactive (07/24/2022)  Social Connections: Moderately Isolated (07/24/2022)  Stress: No Stress Concern Present (07/24/2022)  Tobacco Use: Low Risk  (10/20/2022)    Readmission Risk Interventions    10/27/2022   10:19 AM  Readmission Risk Prevention Plan  Transportation Screening Complete  PCP or Specialist Appt within 5-7 Days Complete  Home Care Screening Complete  Medication Review (RN CM) Complete

## 2022-11-03 NOTE — Progress Notes (Addendum)
Physical Therapy Treatment Patient Details Name: David Kim MRN: [id number] DOB: 03-03-1946 Today's Date: 11/03/2022   History of Present Illness David Kim is a 76 yo male with PMH paraplegia, severe MDD, persistent adjustment disorder with mixed anxiety and depressed mood ,PUD, gout, HTN, IBS, OSA, prostate cancer, GERD.   He was recently evaluated under IVC last month for expressing suicidal ideation. he was cleared for discharge home on 11/13.  Patient presented to [facility] long ED after home health aide discovered patient took an unknown amount of pills on 10/15/22    PT Comments    General Comments: AxO x 3 cognition improving.  Been watching movies today with Air cabin crew.  Tearful a couple of times when discussed his [name] (recently passed away) Assisted OOB required much assist.  General bed mobility comments: using bed rail to self roll to left then required Max Assist for upper body.  Pt is use to using a Trapeze bar at home. General transfer comment: First assisted from elevated bed to [facility] via "[place]" (unable to use sliding board/need a drop arm BSC) and asissted with hygiene/peri care.  Second assisted from [facility] to to wheelchair "[employer]".  Once in whellchair, pt was able to center self using B UE's. Left pt in wheelchair with safety sitter to cruise the hallway for 20 min.   Pt self propelled using B UE's > 500 feet with a few rest breaks.    Recommendations for follow up therapy are one component of a multi-disciplinary discharge planning process, led by the attending physician.  Recommendations may be updated based on patient status, additional functional criteria and insurance authorization.  Follow Up Recommendations  Skilled nursing-short term rehab (<3 hours/day) Can patient physically be transported by private vehicle: No   Assistance Recommended at Discharge Frequent or constant Supervision/Assistance  Patient can return home with the following A lot of help  with walking and/or transfers;A lot of help with bathing/dressing/bathroom;Assist for transportation;Help with stairs or ramp for entrance   Equipment Recommendations  None recommended by PT    Recommendations for Other Services       Precautions / Restrictions Precautions Precautions: Fall Precaution Comments: paraplegia T12 , incontinence Restrictions Weight Bearing Restrictions: No Other Position/Activity Restrictions: L LE spastic/R LE flaccid     Mobility  Bed Mobility Overal bed mobility: Needs Assistance Bed Mobility: Rolling, Sidelying to Sit, Sit to Sidelying Rolling: Min assist Sidelying to sit: Max assist       General bed mobility comments: using bed rail to self roll to left then required Max Assist for upper body.  Pt is use to using a Trapeze bar at home.    Transfers Overall transfer level: Needs assistance Equipment used: Sliding board, None               General transfer comment: First assisted from elevated bed to [facility] via "[place]" (unable to use sliding board/need a drop arm BSC) and asissted with hygiene/peri care.  Second assisted from [facility] to to wheelchair "[employer]".  Once in whellchair, pt was able to center self using B UE's.    Ambulation/Gait               General Gait Details: NON amb   Theme park manager mobility: Yes Wheelchair parts: Supervision/cueing Distance: 500 feet + with a few resting breaks Wheelchair Assistance Details (indicate cue type and reason):  NO VC's needed.  Pt fully capable to navigate around corners and perfom 180 turns.  Modified Rankin (Stroke Patients Only)       Balance                                            Cognition Arousal/Alertness: Awake/alert Behavior During Therapy: WFL for tasks assessed/performed Overall Cognitive Status: Within Functional Limits for tasks assessed                                  General Comments: AxO x 3 cognition improving.  Been watching movies today with Air cabin crew.  Tearful a couple of times when discussed her [name] (recently passed away)        Exercises      General Comments        Pertinent Vitals/Pain Pain Assessment Pain Assessment: No/denies pain    Home Living                          Prior Function            PT Goals (current goals can now be found in the care plan section) Progress towards PT goals: Progressing toward goals    Frequency    Min 2X/week      PT Plan Current plan remains appropriate    Co-evaluation              AM-PAC PT "6 Clicks" Mobility   Outcome Measure  Help needed turning from your back to your side while in a flat bed without using bedrails?: A Lot Help needed moving from lying on your back to sitting on the side of a flat bed without using bedrails?: A Lot Help needed moving to and from a bed to a chair (including a wheelchair)?: A Lot Help needed standing up from a chair using your arms (e.g., wheelchair or bedside chair)?: Total Help needed to walk in hospital room?: Total Help needed climbing 3-5 steps with a railing? : Total 6 Click Score: 9    End of Session Equipment Utilized During Treatment: Gait belt Activity Tolerance: Patient tolerated treatment well Patient left: with chair alarm set (in wheelchair cruising the hallways with the safety sitter) Nurse Communication: Mobility status PT Visit Diagnosis: Other symptoms and signs involving the nervous system (R29.898)     Time: 0093-8182 PT Time Calculation (min) (ACUTE ONLY): 28 min  Charges:  $Therapeutic Activity: 23-37 mins                     David Kim  PTA Acute  Rehabilitation Services Office M-F          [phone] Weekend pager [phone]

## 2022-11-03 NOTE — Progress Notes (Signed)
**Note David-Identified via Obfuscation** Consultation Progress Note   Patient: David Kim GXQ:[id number] DOB: [birth year] DOA: 10/15/2022 DOS: the patient was seen and examined on 11/03/2022 Primary service: DibiaManfred Shirts, MD  Brief hospital course: David Kim is a 76 yo male with PMH paraplegia, severe MDD, persistent adjustment disorder with mixed anxiety and depressed mood ,PUD, gout, HTN, IBS, OSA, prostate cancer, GERD.  He was recently evaluated under IVC last month after being depressed and expressing suicidal ideation.  He was treated by psychiatry on their service and cleared for discharge home with decrease in his Klonopin dose on 11/13.  Patient presented to [facility] emergency department after home health aide discovered patient took an unknown amount of pills.    During the patient's emergency department evaluation, discussions with EMS revealed there were opened bottles of losartan, tizanidine, oxybutynin, baclofen, and clonazepam.  Upon initial interview the patient endorsed at least taking clonazepam and tizanidine but would not provide additional information.  Initial evaluation also revealed multiple SIRS criteria with fever of 103.2 and urinalysis suggestive of urinary tract infection prompting initiation of intravenous ceftriaxone.  Patient was initially hospitalized in the stepdown unit for suspected drug overdose with initial concern for sepsis secondary to urinary tract infection.  Urine culture came back positive for pansensitive E. coli several days after hospitalization patient was also identified to have persisting left lower lobe infiltrate on 12/14 thought to be due to concurrent pneumonia.  Antibiotics were therefore broadened to cefepime and vancomycin on 12/14.  Clinical evidence of SIRS/sepsis resolved in the days that followed and course of therapy completed early in the morning on 12/19.  After several days of withholding all of the suspected overdosed agents patient began to exhibit extreme  agitation tremulousness and hallucinations concerning for benzodiazepine withdrawal.  Patient was then placed back on scheduled benzodiazepines and baclofen on 12/13.    From 12/13 until 12/19 patient's mentation gradually improved. Degree of agitation and confusion also gradually began to improve.  Extensive encephalopathy workup was undertaken including obtaining CT head, MRI brain and EEG all of which did not identify a contributing cause.  Psychiatry was also consulted on admission however their assessments have been obscured by ongoing encephalopathy felt to be due to both infection and drug overdose.  They recommend Inpatient psych placement for management of his mental illness  Due to guarded prognosis palliative care consultation was additionally obtained.   Assessment and Plan: * Intentional overdose (David Kim) Suspected intentional overdose, found to have open bottles of losartan, tizanidine, oxybutynin, baclofen and clonazepam by EMS.  Quantity is unclear.   Originally, all of patient's home medications were initially held due to the overdose  Patient was then resumed on benzodiazepines and baclofen on 12/13 due to concerns for withdrawal and have been slowly tapered since Continue sitter one-to-one safety precautions Will place IVC paperwork if patient attempts to elope. Psychiatry following, is assisting with management of delirium.  Their assistance is appreciated. Monitoring closely with serial labs and ongoing telemetry monitoring  Sepsis (David Kim) Sepsis thought to be secondary to E. coli urinary tract infection on admission.  Then, several days later  left lower lobe infiltrate noted on chest x-ray 12/14 as an additional source. Antibiotic regimen was expanded from ceftriaxone monotherapy to cefepime and vancomycin on 12/14.  Antibiotics completed early the morning of 12/19. SIRS have resolved Following repeat blood cultures discontinuing intravenous fluids today.  Acute  hypoxemic respiratory failure (David Kim)-resolved as of 10/16/2022 - suspect in setting of OD - no overt signs  of aspiration of loss of consciousness - has been weaned back to RA  Toxic metabolic encephalopathy Patient continues to exhibit confusion, episodes of possible myoclonus and visual hallucinations. Multifactorial.  Multiple contributing causes including underlying infection (resolved), drug overdose (resolved) and subsequent withdrawal (also resolved). Case discussed with Dr. Lorrin Goodell with Neurology today (12/19) to review what may be causing patient's encephalopathy to persist.  He recommends waiting 48 hours after the last cefepime dose to see if this helps (although patient was encephalopathic prior to the cefepime), keeping the patient on modest regimens of baclofen '10mg'$  twice daily and Clonazepam 0.'5mg'$  BID and obtaining a repeat EEG.   Seemingly slow clinical improvement overall although patient seems to have bouts of acute worsening MRI brain unremarkable Recent TSH, vitamin B12 unremarkable. EEG obtained on 12/15 without obvious seizure activity although this was substantially obscured by ongoing benzodiazepine use. Supportive care monitor for continued slow improvement.   GIB (gastrointestinal bleeding) Episode of bright red blood per rectum on 12/12 followed by additional small episode morning of 12/13  Patient's status post 2 unit packed blood cell transfusion on 12/12.   GI Consulted, they felt  that susspected etiology is due to stercoral colitis with ulceration No evidence of GI bleeding since with stable hemoglobin and hematocrit.    Bacteremia due to Escherichia coli Antibiotic course completed early the morning of 12/19.  Stercoral colitis history of severe constipation.   In the past, patient's wife was primarily involved in performing regular rectal stimulation and disimpactions prior to her death Aggressive laxative regimen ordered with MiraLAX senna and Colace  with patient now moving his bowels regularly CT imaging suggestive of stercoral colitis  Given his underlying paraplegia, suspect he has neurogenic bowel Disimpactions as needed  Metabolic acidosis Etiology suspected due to overdose Resolved  Severe recurrent major depression [facility]) Psychiatry following although patient is still suffering from some element of encephalopathy.  Their input is appreciated. Had a long discussion with patient's niece, [name] on 12/12 and again today (12/19).  Patient has become severely depressed since his wife passed away in Aug 31, 2023.  She was essentially the patient's sole caretaker and he was completely dependent on her.  Since her passing, he has began "shutting down"  Pressure injury of sacral region, stage 2 ([place]) Present on admission Daily dressing changes Frequent turns  Essential hypertension Titrating amlodipine to 10 mg once daily PRN intravenous antihypertensives for excessively elevated blood pressure   Paraplegia (David Kim) Frequent turns to minimize development of pressure ulcerations Fall cautions  GERD Continue oral Protonix  Hypokalemia Replaced   Goals of care, counseling/discussion Goals of care discussion 12/12 with [name] patient's niece along with multiple other family members. No one has power of attorney.  Decision making for patient's care has been shared between the patient's son-in-law, daughter-in-law and niece. discussed goals of care with niece and family on 12/14.  They are well aware of the patient's guarded prognosis.   Patient is currently DNR Palliative care has been consulted and is following along, their assistance is appreciated.  Elevated liver enzymes - possibly in setting of sepsis vs OD - continue IVF and trend LFTs        TRH will continue to follow the patient.  Subjective: No acute overnight events.  Awaiting in patient psych placement.  Physical Exam: Vitals:   11/02/22 0624  11/02/22 1209 11/02/22 2032 11/03/22 0546  BP: 130/76 125/60 132/78 129/74  Pulse: 95 (!) 105 97 93  Resp: 20 18 18  18  Temp: 99.2 F (37.3 C) 98.1 F (36.7 C) 98.8 F (37.1 C) 98.1 F (36.7 C)  TempSrc:  Oral Oral Oral  SpO2: 96% 96% 95% 97%  Weight:      Height:      Appears in no acute distress S1-S2, regular, no murmur auscultated Lungs clear to auscultation bilaterally  Data Reviewed:  There are no new results to review at this time.  Family Communication: Sister  Time spent: 15  minutes.  Author: Cristela Felt, MD 11/03/2022 10:37 AM  For on call review [email or web address].

## 2022-11-04 DIAGNOSIS — T50902D Poisoning by unspecified drugs, medicaments and biological substances, intentional self-harm, subsequent encounter: Secondary | ICD-10-CM | POA: Diagnosis not present

## 2022-11-04 LAB — CBC
HCT: 44.8 % (ref 39.0–52.0)
Hemoglobin: 14.4 g/dL (ref 13.0–17.0)
MCH: 29.8 pg (ref 26.0–34.0)
MCHC: 32.1 g/dL (ref 30.0–36.0)
MCV: 92.6 fL (ref 80.0–100.0)
Platelets: 206 10*3/uL (ref 150–400)
RBC: 4.84 MIL/uL (ref 4.22–5.81)
RDW: 14.5 % (ref 11.5–15.5)
WBC: 8.1 10*3/uL (ref 4.0–10.5)
nRBC: 0 % (ref 0.0–0.2)

## 2022-11-04 LAB — BASIC METABOLIC PANEL
Anion gap: 5 (ref 5–15)
BUN: 33 mg/dL — ABNORMAL HIGH (ref 8–23)
CO2: 27 mmol/L (ref 22–32)
Calcium: 8.6 mg/dL — ABNORMAL LOW (ref 8.9–10.3)
Chloride: 107 mmol/L (ref 98–111)
Creatinine, Ser: 0.61 mg/dL (ref 0.61–1.24)
GFR, Estimated: >60 mL/min (ref >60)
Glucose, Bld: 119 mg/dL — ABNORMAL HIGH (ref 70–99)
Potassium: 3.6 mmol/L (ref 3.5–5.1)
Sodium: 139 mmol/L (ref 135–145)

## 2022-11-04 NOTE — Progress Notes (Signed)
Progress Note   Patient: David Kim DZH:[id number] DOB: December 04, 1945 DOA: 10/15/2022     20 DOS: the patient was seen and examined on 11/04/2022   Brief hospital course: [name] is a 76 yo male with PMH paraplegia, severe MDD, persistent adjustment disorder with mixed anxiety and depressed mood ,PUD, gout, HTN, IBS, OSA, prostate cancer, GERD.  He was recently evaluated under IVC last month after being depressed and expressing suicidal ideation.  He was treated by psychiatry on their service and cleared for discharge home with decrease in his Klonopin dose on 11/13.  Patient presented to [facility] emergency department after home health aide discovered patient took an unknown amount of pills.    During the patient's emergency department evaluation, discussions with EMS revealed there were opened bottles of losartan, tizanidine, oxybutynin, baclofen, and clonazepam.  Upon initial interview the patient endorsed at least taking clonazepam and tizanidine but would not provide additional information.  Initial evaluation also revealed multiple SIRS criteria with fever of 103.2 and urinalysis suggestive of urinary tract infection prompting initiation of intravenous ceftriaxone.  Patient was initially hospitalized in the stepdown unit for suspected drug overdose with initial concern for sepsis secondary to urinary tract infection.  Urine culture came back positive for pansensitive E. coli several days after hospitalization patient was also identified to have persisting left lower lobe infiltrate on 12/14 thought to be due to concurrent pneumonia.  Antibiotics were therefore broadened to cefepime and vancomycin on 12/14.  Clinical evidence of SIRS/sepsis resolved in the days that followed and course of therapy completed early in the morning on 12/19.  After several days of withholding all of the suspected overdosed agents patient began to exhibit extreme agitation tremulousness and hallucinations  concerning for benzodiazepine withdrawal.  Patient was then placed back on scheduled benzodiazepines and baclofen on 12/13.    From 12/13 until 12/19 patient's mentation gradually improved. Degree of agitation and confusion also gradually began to improve.  Extensive encephalopathy workup was undertaken including obtaining CT head, MRI brain and EEG all of which did not identify a contributing cause.  Psychiatry was also consulted on admission however their assessments have been obscured by ongoing encephalopathy felt to be due to both infection and drug overdose.  Due to guarded prognosis palliative care consultation was additionally obtained.   Assessment and Plan: * Intentional overdose ([place]) Suspected intentional overdose, found to have open bottles of losartan, tizanidine, oxybutynin, baclofen and clonazepam by EMS.  Quantity is unclear.   Originally, all of patient's home medications were initially held due to the overdose  Patient was then resumed on benzodiazepines and baclofen on 12/13 due to concerns for withdrawal and have been slowly tapered since Continue sitter one-to-one safety precautions Will place IVC paperwork if patient attempts to elope. Psychiatry following, is assisting with management of delirium.  Their assistance is appreciated. Monitoring closely with serial labs and ongoing telemetry monitoring  Sepsis ([place]) Sepsis thought to be secondary to E. coli urinary tract infection on admission.  Then, several days later  left lower lobe infiltrate noted on chest x-ray 12/14 as an additional source. Antibiotic regimen was expanded from ceftriaxone monotherapy to cefepime and vancomycin on 12/14.  Antibiotics completed early the morning of 12/19. SIRS have resolved Repeat blood cultures negative.  Acute hypoxemic respiratory failure (HCC)-resolved as of 10/16/2022 - suspect in setting of OD - no overt signs of aspiration of loss of consciousness - has been weaned back to  RA  Toxic metabolic encephalopathy  Patient continues to exhibit confusion, episodes of possible myoclonus and visual hallucinations. Multifactorial.  Multiple contributing causes including underlying infection (resolved), drug overdose (resolved) and subsequent withdrawal (also resolved). Case discussed with Dr. Lorrin Goodell with Neurology today (12/19) to review what may be causing patient's encephalopathy to persist.  He recommends waiting 48 hours after the last cefepime dose to see if this helps (although patient was encephalopathic prior to the cefepime), keeping the patient on modest regimens of baclofen '10mg'$  twice daily and Clonazepam 0.'5mg'$  BID and obtaining a repeat EEG.   Seemingly slow clinical improvement overall although patient seems to have bouts of acute worsening MRI brain unremarkable Recent TSH, vitamin B12 unremarkable. EEG obtained on 12/15 without obvious seizure activity although this was substantially obscured by ongoing benzodiazepine use. Supportive care monitor for continued slow improvement.   GIB (gastrointestinal bleeding) Episode of bright red blood per rectum on 12/12 followed by additional small episode morning of 12/13  Patient's status post 2 unit packed blood cell transfusion on 12/12.   GI Consulted, they felt  that susspected etiology is due to stercoral colitis with ulceration No evidence of GI bleeding since with stable hemoglobin and hematocrit.    Bacteremia due to Escherichia coli Antibiotic course completed early the morning of 12/19.  Stercoral colitis history of severe constipation.   In the past, patient's wife was primarily involved in performing regular rectal stimulation and disimpactions prior to her death Aggressive laxative regimen ordered with MiraLAX senna and Colace with patient now moving his bowels regularly CT imaging suggestive of stercoral colitis  Given his underlying paraplegia, suspect he has neurogenic bowel Disimpactions as  needed  Metabolic acidosis Etiology suspected due to overdose Resolved  Severe recurrent major depression [facility]) Psychiatry following although patient is still suffering from some element of encephalopathy.  Their input is appreciated. Had a long discussion with patient's niece, [name] on 12/12 and again today (12/19).  Patient has become severely depressed since his wife passed away in 09-01-23.  She was essentially the patient's sole caretaker and he was completely dependent on her.  Since her passing, he has began "shutting down"  Pressure injury of sacral region, stage 2 ([place]) Present on admission Daily dressing changes Frequent turns  Essential hypertension Titrating amlodipine to 10 mg once daily - BP is well controlled PRN intravenous antihypertensives for excessively elevated blood pressure   Paraplegia (HCC) Frequent turns to minimize development of pressure ulcerations Fall cautions  GERD Continue oral Protonix  Hypokalemia Replete and trend   Goals of care, counseling/discussion Goals of care discussion 12/12 with [name] patient's niece along with multiple other family members. No one has power of attorney.  Decision making for patient's care has been shared between the patient's son-in-law, daughter-in-law and niece. discussed goals of care with niece and family on 12/14.  They are well aware of the patient's guarded prognosis.   Patient is currently DNR Palliative care has been consulted and is following along, their assistance is appreciated.  Elevated liver enzymes - possibly in setting of sepsis vs OD - continue IVF and trend LFTs        Subjective: feels ok today. Has no complaints.  Physical Exam: Vitals:   11/03/22 0546 11/03/22 1506 11/03/22 2056 11/04/22 0530  BP: 129/74 127/76 123/73 133/72  Pulse: 93 97 97 88  Resp: 18 18 (!) 23 20  Temp: 98.1 F (36.7 C) 98.8 F (37.1 C) 98.7 F (37.1 C) 98.8 F (37.1 C)  TempSrc: Oral Oral  Oral Oral  SpO2: 97% 97% 100% 95%  Weight:      Height:       Physical Examination: General appearance - alert, well appearing, and in no distress Heart - normal rate, regular rhythm, normal S1, S2, no murmurs, rubs, clicks or gallops Abdomen - soft, nontender, nondistended, no masses or organomegaly Neurological - some involuntary twitching noted, contractures of LE noted  Data Reviewed: Results for orders placed or performed during the hospital encounter of 10/15/22 (from the past 24 hour(s))  Basic metabolic panel     Status: Abnormal   Collection Time: 11/04/22  6:51 AM  Result Value Ref Range   Sodium 139 135 - 145 mmol/L   Potassium 3.6 3.5 - 5.1 mmol/L   Chloride 107 98 - 111 mmol/L   CO2 27 22 - 32 mmol/L   Glucose, Bld 119 (H) 70 - 99 mg/dL   BUN 33 (H) 8 - 23 mg/dL   Creatinine, Ser 0.61 0.61 - 1.24 mg/dL   Calcium 8.6 (L) 8.9 - 10.3 mg/dL   GFR, Estimated >60 >60 mL/min   Anion gap 5 5 - 15  CBC     Status: None   Collection Time: 11/04/22  6:51 AM  Result Value Ref Range   WBC 8.1 4.0 - 10.5 K/uL   RBC 4.84 4.22 - 5.81 MIL/uL   Hemoglobin 14.4 13.0 - 17.0 g/dL   HCT 44.8 39.0 - 52.0 %   MCV 92.6 80.0 - 100.0 fL   MCH 29.8 26.0 - 34.0 pg   MCHC 32.1 30.0 - 36.0 g/dL   RDW 14.5 11.5 - 15.5 %   Platelets 206 150 - 400 K/uL   nRBC 0.0 0.0 - 0.2 %    Family Communication: patient at bedside  Disposition: Status is: Inpatient Remains inpatient appropriate because: unsafe discharge plan, high frequency surveillance needed  Planned Discharge Destination: Barriers to discharge: need for behavioral health admission vs. SNF DVT ppx: lovenox Time spent: 38 minutes  Author: Donnamae Jude, MD 11/04/2022 9:51 AM  For on call review [email or web address].

## 2022-11-05 DIAGNOSIS — R7881 Bacteremia: Secondary | ICD-10-CM | POA: Diagnosis not present

## 2022-11-05 DIAGNOSIS — B9620 Unspecified Escherichia coli [E. coli] as the cause of diseases classified elsewhere: Secondary | ICD-10-CM | POA: Diagnosis not present

## 2022-11-05 NOTE — Progress Notes (Signed)
PROGRESS NOTE    David Kim  EQA:[id number] DOB: [birth year] DOA: 10/15/2022 PCP: Biagio Borg, MD     Brief Narrative:  76 year old white male with paraplegia, severe depression, persistent adjustment disorder with anxiety, gout, hypertension, IBS, OSA, prostate cancer, and reflux.  Had an involuntary commitment last month after being depressed and having SI, his wife passed away in 09-09-2023.  He presented on 12/10 after a home health aide discovered he took an undisclosed amount of Klonopin.  During the patient's emergency department evaluation, discussions with EMS revealed there were opened bottles of losartan, tizanidine, oxybutynin, baclofen, and clonazepam.  Upon initial interview the patient endorsed at least taking clonazepam and tizanidine but would not provide additional information.  Initial evaluation also revealed multiple SIRS criteria with fever of 103.2 and urinalysis suggestive of urinary tract infection prompting initiation of intravenous ceftriaxone.  Patient was initially hospitalized in the stepdown unit for suspected drug overdose with initial concern for sepsis secondary to urinary tract infection.   Urine culture came back positive for pansensitive E. coli several days after hospitalization patient was also identified to have persisting left lower lobe infiltrate on 12/14 thought to be due to concurrent pneumonia.  Antibiotics were therefore broadened to cefepime and vancomycin on 12/14.  Clinical evidence of SIRS/sepsis resolved in the days that followed and course of therapy completed early in the morning on 12/19.   After several days of withholding all of the suspected overdosed agents patient began to exhibit extreme agitation tremulousness and hallucinations concerning for benzodiazepine withdrawal.  Patient was then placed back on scheduled benzodiazepines and baclofen on 12/13.     From 12/13 until 12/19 patient's mentation gradually improved. Degree of agitation  and confusion also gradually began to improve.  Extensive encephalopathy workup was undertaken including obtaining CT head, MRI brain and EEG all of which did not identify a contributing cause.   Psychiatry was also consulted on admission however their assessments have been obscured by ongoing encephalopathy felt to be due to both infection and drug overdose.   Due to guarded prognosis palliative care consultation was additionally obtained.  New events last 24 hours / Subjective: No acute changes.  Awaiting inpatient psychiatric placement.  Denies any homicidal or suicidal ideation.  Assessment and Plan: * Intentional overdose ([place]) Suspected intentional overdose, found to have open bottles of losartan, tizanidine, oxybutynin, baclofen and clonazepam by EMS.  Quantity is unclear.   Originally, all of patient's home medications were initially held due to the overdose  Patient was then resumed on benzodiazepines and baclofen on 12/13 due to concerns for withdrawal and have been slowly tapered since Continue sitter one-to-one safety precautions Will place IVC paperwork if patient attempts to elope. Psychiatry following, is assisting with management of delirium.  Their assistance is appreciated.  Their most recent evaluation was on 12/28 and they still recommend inpatient psychiatric admission. Labs are currently normal, will start checking weekly. Risperdal 0.5 mg daily Klonopin 0.5 mg twice daily   Sepsis ([place]) Resolved, initially sepsis thought to be secondary to E. coli urinary tract infection on admission.  Then, several days later  left lower lobe infiltrate noted on chest x-ray 12/14 as an additional source. Cefepime/vancomycin started on 12/19, completed early the morning of 12/19. Repeat blood cultures negative.   Acute hypoxemic respiratory failure (HCC)-resolved as of 10/16/2022 - suspect in setting of OD -Resolved,  currently back to room air   Toxic metabolic  encephalopathy Improved, was continued to have confusion and hallucinations  Multifactorial.  Multiple contributing causes including underlying infection (resolved), drug overdose (resolved) and subsequent withdrawal (also resolved). EEG obtained on 12/15 without obvious seizure activity although this was substantially obscured by ongoing benzodiazepine use. Neurology evaluated the case, repeat EEG was unremarkable on 12/20 MRI brain unremarkable Recent TSH, vitamin B12 unremarkable.    GIB (gastrointestinal bleeding) Episode of bright red blood per rectum on 12/12 followed by additional small episode morning of 12/13  Patient's status post 2 unit packed blood cell transfusion on 12/12.   GI Consulted, they felt  that susspected etiology is due to stercoral colitis with ulceration No evidence of GI bleeding since with stable hemoglobin and hematocrit.     Bacteremia due to Escherichia coli Antibiotic course completed early the morning of 12/19.   Stercoral colitis history of severe constipation.   In the past, patient's wife was primarily involved in performing regular rectal stimulation and disimpactions prior to her death Aggressive laxative regimen ordered with MiraLAX senna and Colace with patient now moving his bowels regularly CT imaging suggestive of stercoral colitis  Given his underlying paraplegia, suspect he has neurogenic bowel Disimpactions as needed   Metabolic acidosis Etiology suspected due to overdose Resolved   Severe recurrent major depression [facility]) Psychiatry following although patient is still suffering from some element of encephalopathy.  Their input is appreciated. Previous physicians had a long discussion with patient's niece, [name] on 12/12 and again today (12/19).  Patient has become severely depressed since his wife passed away in 28-Aug-2023.  She was essentially the patient's sole caretaker and he was completely dependent on her.  Since her passing, he  has began "shutting down"   Pressure injury of sacral region, stage 2 ([place]) Present on admission Daily dressing changes Frequent turns   Essential hypertension Titrating amlodipine to 10 mg once daily  Hydralazine as needed     Paraplegia (HCC) Frequent turns to minimize development of pressure ulcerations Fall precautions   GERD Protonix 40 mg daily   Hypokalemia Resolved     Goals of care, counseling/discussion Goals of care discussion 12/12 with [name] patient's niece along with multiple other family members. No one has power of attorney.  Decision making for patient's care has been shared between the patient's son-in-law, daughter-in-law and niece. discussed goals of care with niece and family on 12/14.  They are well aware of the patient's guarded prognosis.   Patient is currently DNR Palliative care has been consulted and is following along, their assistance is appreciated.   Elevated liver enzymes - possibly in setting of sepsis vs OD - continue IVF and trend LFTs    In agreement with assessment of the pressure ulcer as below:  Pressure Injury 10/16/22 Sacrum Anterior;Mid Unstageable - Full thickness tissue loss in which the base of the injury is covered by slough (yellow, tan, gray, green or brown) and/or eschar (tan, brown or black) in the wound bed. (Active)  10/16/22 0000  Location: Sacrum  Location Orientation: Anterior;Mid  Staging: Unstageable - Full thickness tissue loss in which the base of the injury is covered by slough (yellow, tan, gray, green or brown) and/or eschar (tan, brown or black) in the wound bed.  Wound Description (Comments):   Present on Admission: Yes  Dressing Type Foam - Lift dressing to assess site every shift 11/05/22 0925     Pressure Injury 10/16/22 Heel Left;Anterior Stage 1 -  Intact skin with non-blanchable redness of a localized area usually over a bony prominence. (Active)  10/16/22 0000  Location: Heel  Location  Orientation: Left;Anterior  Staging: Stage 1 -  Intact skin with non-blanchable redness of a localized area usually over a bony prominence.  Wound Description (Comments):   Present on Admission: Yes  Dressing Type Foam - Lift dressing to assess site every shift 11/05/22 0925     Pressure Injury 10/24/22 Thigh Left;Posterior;Proximal Stage 2 -  Partial thickness loss of dermis presenting as a shallow open injury with a red, pink wound bed without slough. (Active)  10/24/22 1940  Location: Thigh  Location Orientation: Left;Posterior;Proximal  Staging: Stage 2 -  Partial thickness loss of dermis presenting as a shallow open injury with a red, pink wound bed without slough.  Wound Description (Comments):   Present on Admission:   Dressing Type Foam - Lift dressing to assess site every shift 11/05/22 0925         DVT prophylaxis: Lovenox Code Status: DNR Family Communication: None at bedside Coming From: Home Disposition Plan: Inpatient psychiatric admission Barriers to Discharge: Bed availability  Consultants:  Gastroenterology Psychiatry Neurology  Procedures:  EEG  Antimicrobials:  Anti-infectives (From admission, onward)    Start     Dose/Rate Route Frequency Ordered Stop   10/20/22 0800  vancomycin (VANCOREADY) IVPB 750 mg/150 mL        750 mg 150 mL/hr over 60 Minutes Intravenous Every 12 hours 10/19/22 1936 10/24/22 2200   10/19/22 1800  vancomycin (VANCOREADY) IVPB 1500 mg/300 mL        1,500 mg 150 mL/hr over 120 Minutes Intravenous  Once 10/19/22 1653 10/19/22 2047   10/19/22 1800  ceFEPIme (MAXIPIME) 2 g in sodium chloride 0.9 % 100 mL IVPB        2 g 200 mL/hr over 30 Minutes Intravenous Every 8 hours 10/19/22 1653 10/24/22 0339   10/17/22 1815  metroNIDAZOLE (FLAGYL) IVPB 500 mg  Status:  Discontinued        500 mg 100 mL/hr over 60 Minutes Intravenous 2 times daily 10/17/22 1758 10/18/22 1135   10/16/22 2200  cefTRIAXone (ROCEPHIN) 2 g in sodium chloride  0.9 % 100 mL IVPB  Status:  Discontinued        2 g 200 mL/hr over 30 Minutes Intravenous Every 24 hours 10/16/22 1307 10/19/22 1636   10/16/22 2100  cefTRIAXone (ROCEPHIN) 1 g in sodium chloride 0.9 % 100 mL IVPB  Status:  Discontinued        1 g 200 mL/hr over 30 Minutes Intravenous Every 24 hours 10/15/22 2335 10/16/22 1307   10/15/22 2145  cefTRIAXone (ROCEPHIN) 2 g in sodium chloride 0.9 % 100 mL IVPB        2 g 200 mL/hr over 30 Minutes Intravenous  Once 10/15/22 2134 10/15/22 2223        Objective: Vitals:   11/04/22 0800 11/04/22 1300 11/04/22 1952 11/05/22 0615  BP: 134/74 115/68 111/70 109/69  Pulse: 90 95 95 88  Resp: (!) '21 19 18 18  '$ Temp: 98.7 F (37.1 C) 98.5 F (36.9 C) 99.2 F (37.3 C) 98.1 F (36.7 C)  TempSrc: Oral Oral Oral Oral  SpO2: 97% 99% 95% 94%  Weight:      Height:        Intake/Output Summary (Last 24 hours) at 11/05/2022 0957 Last data filed at 11/05/2022 0905 Gross per 24 hour  Intake 120 ml  Output 450 ml  Net -330 ml   Filed Weights   10/15/22 2347 10/23/22 0000  Weight: 82.2 kg  85.7 kg    Examination:  General exam: Appears calm and comfortable  Respiratory system: Clear to auscultation. Respiratory effort normal. No respiratory distress. No conversational dyspnea.  Cardiovascular system: S1 & S2 heard, RRR.  Gastrointestinal system: Abdomen is nondistended, soft and nontender. Normal bowel sounds heard. Central nervous system: Speech clear.  Skin: No rashes, lesions or ulcers on exposed skin  Psychiatry: Mood & affect appropriate.   Data Reviewed: I have personally reviewed following labs and imaging studies  CBC: Recent Labs  Lab 11/04/22 0651  WBC 8.1  HGB 14.4  HCT 44.8  MCV 92.6  PLT 588   Basic Metabolic Panel: Recent Labs  Lab 11/02/22 0604 11/04/22 0651  NA 140 139  K 3.1* 3.6  CL 106 107  CO2 25 27  GLUCOSE 123* 119*  BUN 42* 33*  CREATININE 0.60* 0.61  CALCIUM 8.7* 8.6*   Scheduled Meds:   amLODipine  10 mg Oral Daily   baclofen  10 mg Oral BID   clonazePAM  0.5 mg Oral BID   docusate sodium  100 mg Oral BID   enoxaparin (LOVENOX) injection  40 mg Subcutaneous Q24H   leptospermum manuka honey  1 Application Topical Daily   melatonin  10 mg Oral QHS   pantoprazole  40 mg Oral Daily   polyethylene glycol  17 g Oral BID   potassium chloride  20 mEq Oral BID   potassium chloride  40 mEq Oral Once   risperiDONE  0.5 mg Oral QHS   senna-docusate  1 tablet Oral BID   Continuous Infusions:  sodium chloride Stopped (11/02/22 1315)     LOS: 21 days    Time spent: 25 minutes   Shelda Pal, DO [facility] 11/05/2022, 9:57 AM   Available via Epic secure chat 7am-7pm After these hours, please refer to coverage provider listed on [email or web address]

## 2022-11-05 NOTE — Progress Notes (Signed)
Physical Therapy Treatment Patient Details Name: David Kim MRN: [id number] DOB: [birth year] Today's Date: 11/05/2022   History of Present Illness [name] is a 76 yo male with PMH paraplegia, severe MDD, persistent adjustment disorder with mixed anxiety and depressed mood ,PUD, gout, HTN, IBS, OSA, prostate cancer, GERD.   He was recently evaluated under IVC last month for expressing suicidal ideation. he was cleared for discharge home on 11/13.  Patient presented to [facility] long ED after home health aide discovered patient took an unknown amount of pills on 10/15/22    PT Comments    General Comments: AxO x 3 cognition at prior level.  Pt stated he no loger wants to die.  Pt is fearfu "what's to come" where he will go after hospital.  Pt unasure about discharge plans. 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  Assisted OOB to wheelchair.  General bed mobility comments: rolling side to side for peri care (incont BB) and applied brief.  General transfer comment: Using a long sliding board, pt was able to self slide 75% after set up from elevated bed to wheelchair.  Then again from wheelchair to recliner 50% due to slight upward surface. Programme researcher, broadcasting/film/video Details (indicate cue type and reason): NO VC's needed.  Pt fully capable to navigate around corners and perfom 180 turns.  Tolerated self propelling >450 feet with a couple of rest stops.  "I've gotten weaker in my arms". Assisted to bathroom positioned at sink.  Allowed pt to brush his teeth and after washing his hair (shower cap) he combed.  "I almost feel human again".  Assisted back to recliner and positioned to comfort. Prior to admit pt was Indep with bed mobility.  "I was able to change my own briefs".  Pt had a trapeze above his bed which helped.  Prior to admit, pt was Indep with sliding board transfers. Currently pt requires assist thus will need ST Rehab at SNF to address his functional decline and level of Indep.   Recommendations for follow up therapy are one component of a multi-disciplinary discharge planning process, led by the attending physician.  Recommendations may be updated based on patient status, additional functional criteria and insurance authorization.  Follow Up Recommendations  Skilled nursing-short term rehab (<3 hours/day) Can patient physically be transported by private vehicle: No   Assistance Recommended at Discharge Frequent or constant Supervision/Assistance  Patient can return home with the following A lot of help with walking and/or transfers;A lot of help with bathing/dressing/bathroom;Assist for transportation;Help with stairs or ramp for entrance   Equipment Recommendations  None recommended by PT    Recommendations for Other Services       Precautions / Restrictions Precautions Precautions: Fall Precaution Comments: paraplegia T12 , incontinence Restrictions Weight Bearing Restrictions: No Other Position/Activity Restrictions: L LE spastic/R LE flaccid     Mobility  Bed Mobility Overal bed mobility: Needs Assistance Bed Mobility: Rolling, Sidelying to Sit, Sit to Sidelying Rolling: Min assist Sidelying to sit: Max assist       General bed mobility comments: rolling side to side for peri care (incont BB) and applied brief    Transfers Overall transfer level: Needs assistance Equipment used: Sliding board               General transfer comment: Using a long sliding board, pt was able to self slide 75% after set up from elevated bed to wheelchair.  Then again from wheelchair to recliner 50% due to slight  upward surface.    Ambulation/Gait               General Gait Details: NON amb   Geneticist, molecular Details (indicate cue type and reason): NO VC's needed.  Pt fully capable to navigate around corners and perfom 180 turns.  Tolerated self propelling >450 feet with a  couple of rest stops.  "I've gotten weaker in my arms".  Modified Rankin (Stroke Patients Only)       Balance                                            Cognition Arousal/Alertness: Awake/alert Behavior During Therapy: WFL for tasks assessed/performed Overall Cognitive Status: Within Functional Limits for tasks assessed                                 General Comments: AxO x 3 cognition at prior level.  Pt stated he no loger wants to die.  Pt is fearfu "what's to come" where he will go after hospital.  Pt unasure about discharge plans.        Exercises      General Comments        Pertinent Vitals/Pain Pain Assessment Pain Assessment: No/denies pain    Home Living                          Prior Function            PT Goals (current goals can now be found in the care plan section) Progress towards PT goals: Progressing toward goals    Frequency    Min 2X/week      PT Plan Current plan remains appropriate    Co-evaluation              AM-PAC PT "6 Clicks" Mobility   Outcome Measure  Help needed turning from your back to your side while in a flat bed without using bedrails?: A Lot Help needed moving from lying on your back to sitting on the side of a flat bed without using bedrails?: A Lot Help needed moving to and from a bed to a chair (including a wheelchair)?: A Lot Help needed standing up from a chair using your arms (e.g., wheelchair or bedside chair)?: Total Help needed to walk in hospital room?: Total Help needed climbing 3-5 steps with a railing? : Total 6 Click Score: 9    End of Session Equipment Utilized During Treatment: Gait belt Activity Tolerance: Patient tolerated treatment well Patient left: in chair;with nursing/sitter in room;with call bell/phone within reach Nurse Communication: Mobility status PT Visit Diagnosis: Other symptoms and signs involving the nervous system (R29.898)      Time: 1015-1105 PT Time Calculation (min) (ACUTE ONLY): 50 min  Charges:  $Therapeutic Activity: 23-37 mins $Wheel Chair Management: 8-22 mins                     Rica Koyanagi  PTA [place] [facility] M-F          [phone] Weekend pager [phone]

## 2022-11-05 NOTE — Plan of Care (Signed)
  Problem: Health Behavior/Discharge Planning: Goal: Ability to manage health-related needs will improve Outcome: Progressing   

## 2022-11-06 DIAGNOSIS — B9620 Unspecified Escherichia coli [E. coli] as the cause of diseases classified elsewhere: Secondary | ICD-10-CM | POA: Diagnosis not present

## 2022-11-06 DIAGNOSIS — R7881 Bacteremia: Secondary | ICD-10-CM | POA: Diagnosis not present

## 2022-11-06 NOTE — Progress Notes (Signed)
PROGRESS NOTE    David Kim  RCV:[id number] DOB: 11-28-1945 DOA: 10/15/2022 PCP: Biagio Borg, MD     Brief Narrative:  77 year old white male with paraplegia, severe depression, persistent adjustment disorder with anxiety, gout, hypertension, IBS, OSA, prostate cancer, and reflux.  Had an involuntary commitment last month after being depressed and having SI, his wife passed away in Aug 21, 2023.  He presented on 12/10 after a home health aide discovered he took an undisclosed amount of Klonopin.   During the patient's emergency department evaluation, discussions with EMS revealed there were opened bottles of losartan, tizanidine, oxybutynin, baclofen, and clonazepam.  Upon initial interview the patient endorsed at least taking clonazepam and tizanidine but would not provide additional information.  Initial evaluation also revealed multiple SIRS criteria with fever of 103.2 and urinalysis suggestive of urinary tract infection prompting initiation of intravenous ceftriaxone.  Patient was initially hospitalized in the stepdown unit for suspected drug overdose with initial concern for sepsis secondary to urinary tract infection.   Urine culture came back positive for pansensitive E. coli several days after hospitalization patient was also identified to have persisting left lower lobe infiltrate on 12/14 thought to be due to concurrent pneumonia.  Antibiotics were therefore broadened to cefepime and vancomycin on 12/14.  Clinical evidence of SIRS/sepsis resolved in the days that followed and course of therapy completed early in the morning on 12/19.   After several days of withholding all of the suspected overdosed agents patient began to exhibit extreme agitation tremulousness and hallucinations concerning for benzodiazepine withdrawal.  Patient was then placed back on scheduled benzodiazepines and baclofen on 12/13.     From 12/13 until 12/19 patient's mentation gradually improved. Degree of agitation  and confusion also gradually began to improve.  Extensive encephalopathy workup was undertaken including obtaining CT head, MRI brain and EEG all of which did not identify a contributing cause.   Psychiatry was also consulted on admission however their assessments have been obscured by ongoing encephalopathy felt to be due to both infection and drug overdose.   Due to guarded prognosis palliative care consultation was additionally obtained.  New events last 24 hours / Subjective: No acute changes, awaiting inpatient psychiatric placement.  No current homicidal or suicidal ideation.  When speaking about his deceased wife, he does become tearful.  Assessment & Plan:   * Intentional overdose ([place]) Suspected intentional overdose, found to have open bottles of losartan, tizanidine, oxybutynin, baclofen and clonazepam by EMS.  Quantity is unclear.   Originally, all of patient's home medications were initially held due to the overdose  Patient was then resumed on benzodiazepines and baclofen on 12/13 due to concerns for withdrawal and have been slowly tapered since Continue sitter one-to-one safety precautions Will place IVC paperwork if patient attempts to elope. Psychiatry following, is assisting with management of delirium.  Their assistance is appreciated.  Their most recent evaluation was on 12/28 and they still recommend inpatient psychiatric admission. Labs are currently normal, will start checking weekly. Risperdal 0.5 mg daily Klonopin 0.5 mg twice daily   Sepsis ([place]) Resolved, initially sepsis thought to be secondary to E. coli urinary tract infection on admission.  Then, several days later  left lower lobe infiltrate noted on chest x-ray 12/14 as an additional source. Cefepime/vancomycin started on 12/19, completed early the morning of 12/19. Repeat blood cultures negative.   Acute hypoxemic respiratory failure (HCC)-resolved as of 10/16/2022 - suspect in setting of OD -Resolved,   currently back to room air  Toxic metabolic encephalopathy Improved, was continued to have confusion and hallucinations Multifactorial.  Multiple contributing causes including underlying infection (resolved), drug overdose (resolved) and subsequent withdrawal (also resolved). EEG obtained on 12/15 without obvious seizure activity although this was substantially obscured by ongoing benzodiazepine use. Neurology evaluated the case, repeat EEG was unremarkable on 12/20 MRI brain unremarkable Recent TSH, vitamin B12 unremarkable.     GIB (gastrointestinal bleeding) Episode of bright red blood per rectum on 12/12 followed by additional small episode morning of 12/13  Patient's status post 2 unit packed blood cell transfusion on 12/12.   GI Consulted, they felt  that susspected etiology is due to stercoral colitis with ulceration No evidence of GI bleeding since with stable hemoglobin and hematocrit.     Bacteremia due to Escherichia coli Antibiotic course completed early the morning of 12/19.   Stercoral colitis History of severe constipation.   In the past, patient's wife was primarily involved in performing regular rectal stimulation and disimpactions prior to her death Aggressive laxative regimen ordered with MiraLAX senna and Colace with patient now moving his bowels regularly CT imaging suggestive of stercoral colitis  Given his underlying paraplegia, suspect he has neurogenic bowel Disimpactions as needed   Metabolic acidosis Etiology suspected due to overdose Resolved   Severe recurrent major depression [facility]) Psychiatry following although patient is still suffering from some element of encephalopathy.  Their input is appreciated. Previous physicians had a long discussion with patient's niece, [name] on 12/12 and again today (12/19).  Patient has become severely depressed since his wife passed away in 2023/09/01.  She was essentially the patient's sole caretaker and he was  completely dependent on her.  Since her passing, he has began "shutting down"   Pressure injury of sacral region, stage 2 ([place]) Present on admission Daily dressing changes Frequent turns   Essential hypertension Amlodipine 10 mg daily  Hydralazine as needed     Paraplegia (HCC) Frequent turns to minimize development of pressure ulcerations Fall precautions   GERD Protonix 40 mg daily   Hypokalemia Resolved     Goals of care, counseling/discussion Goals of care discussion 12/12 with [name] patient's niece along with multiple other family members. No one has power of attorney.  Decision making for patient's care has been shared between the patient's son-in-law, daughter-in-law and niece. Discussed goals of care with niece and family on 12/14.  They are well aware of the patient's guarded prognosis.   Patient is currently DNR Palliative care has been consulted and is following along, their assistance is appreciated.   Elevated liver enzymes  Resolved   In agreement with assessment of the pressure ulcer as below:  Pressure Injury 10/16/22 Sacrum Anterior;Mid Unstageable - Full thickness tissue loss in which the base of the injury is covered by slough (yellow, tan, gray, green or brown) and/or eschar (tan, brown or black) in the wound bed. (Active)  10/16/22 0000  Location: Sacrum  Location Orientation: Anterior;Mid  Staging: Unstageable - Full thickness tissue loss in which the base of the injury is covered by slough (yellow, tan, gray, green or brown) and/or eschar (tan, brown or black) in the wound bed.  Wound Description (Comments):   Present on Admission: Yes  Dressing Type Foam - Lift dressing to assess site every shift 11/05/22 1654     Pressure Injury 10/16/22 Heel Left;Anterior Stage 1 -  Intact skin with non-blanchable redness of a localized area usually over a bony prominence. (Active)  10/16/22 0000  Location:  Heel  Location Orientation: Left;Anterior   Staging: Stage 1 -  Intact skin with non-blanchable redness of a localized area usually over a bony prominence.  Wound Description (Comments):   Present on Admission: Yes  Dressing Type Foam - Lift dressing to assess site every shift 11/05/22 0925     Pressure Injury 10/24/22 Thigh Left;Posterior;Proximal Stage 2 -  Partial thickness loss of dermis presenting as a shallow open injury with a red, pink wound bed without slough. (Active)  10/24/22 1940  Location: Thigh  Location Orientation: Left;Posterior;Proximal  Staging: Stage 2 -  Partial thickness loss of dermis presenting as a shallow open injury with a red, pink wound bed without slough.  Wound Description (Comments):   Present on Admission:   Dressing Type Foam - Lift dressing to assess site every shift 11/05/22 0925   DVT prophylaxis: Lovenox Code Status: DNR Family Communication: None at bedside Coming From: Home Disposition Plan: Inpatient psychiatric admission Barriers to Discharge: Bed availability   Consultants:  Gastroenterology Psychiatry Neurology   Procedures:  EEG  Antimicrobials:  Anti-infectives (From admission, onward)    Start     Dose/Rate Route Frequency Ordered Stop   10/20/22 0800  vancomycin (VANCOREADY) IVPB 750 mg/150 mL        750 mg 150 mL/hr over 60 Minutes Intravenous Every 12 hours 10/19/22 1936 10/24/22 2200   10/19/22 1800  vancomycin (VANCOREADY) IVPB 1500 mg/300 mL        1,500 mg 150 mL/hr over 120 Minutes Intravenous  Once 10/19/22 1653 10/19/22 2047   10/19/22 1800  ceFEPIme (MAXIPIME) 2 g in sodium chloride 0.9 % 100 mL IVPB        2 g 200 mL/hr over 30 Minutes Intravenous Every 8 hours 10/19/22 1653 10/24/22 0339   10/17/22 1815  metroNIDAZOLE (FLAGYL) IVPB 500 mg  Status:  Discontinued        500 mg 100 mL/hr over 60 Minutes Intravenous 2 times daily 10/17/22 1758 10/18/22 1135   10/16/22 2200  cefTRIAXone (ROCEPHIN) 2 g in sodium chloride 0.9 % 100 mL IVPB  Status:   Discontinued        2 g 200 mL/hr over 30 Minutes Intravenous Every 24 hours 10/16/22 1307 10/19/22 1636   10/16/22 2100  cefTRIAXone (ROCEPHIN) 1 g in sodium chloride 0.9 % 100 mL IVPB  Status:  Discontinued        1 g 200 mL/hr over 30 Minutes Intravenous Every 24 hours 10/15/22 2335 10/16/22 1307   10/15/22 2145  cefTRIAXone (ROCEPHIN) 2 g in sodium chloride 0.9 % 100 mL IVPB        2 g 200 mL/hr over 30 Minutes Intravenous  Once 10/15/22 2134 10/15/22 2223        Objective: Vitals:   11/05/22 1300 11/05/22 1308 11/05/22 2030 11/06/22 0527  BP: 119/71 119/71 115/71 117/72  Pulse: 100 100 92 95  Resp: '20 20 19 '$ (!) 21  Temp: 98.2 F (36.8 C) 98.2 F (36.8 C) 97.8 F (36.6 C) 98.1 F (36.7 C)  TempSrc: Oral Oral Axillary Axillary  SpO2: 97% 97% 96% 98%  Weight:      Height:        Intake/Output Summary (Last 24 hours) at 11/06/2022 1148 Last data filed at 11/06/2022 0655 Gross per 24 hour  Intake 240 ml  Output 880 ml  Net -640 ml   Filed Weights   10/15/22 2347 10/23/22 0000  Weight: 82.2 kg 85.7 kg    Examination: General exam:  Appears calm and comfortable  Respiratory system: Clear to auscultation. Respiratory effort normal. No respiratory distress. No conversational dyspnea.  Cardiovascular system: S1 & S2 heard, RRR. No murmurs. Gastrointestinal system: Abdomen is nondistended, soft and nontender. Normal bowel sounds heard. Skin: No rashes, lesions or ulcers on exposed skin  Psychiatry: Mood & affect appropriate.   Data Reviewed: I have personally reviewed following labs and imaging studies  CBC: Recent Labs  Lab 11/04/22 0651  WBC 8.1  HGB 14.4  HCT 44.8  MCV 92.6  PLT 767   Basic Metabolic Panel: Recent Labs  Lab 11/02/22 0604 11/04/22 0651  NA 140 139  K 3.1* 3.6  CL 106 107  CO2 25 27  GLUCOSE 123* 119*  BUN 42* 33*  CREATININE 0.60* 0.61  CALCIUM 8.7* 8.6*   GFR: Estimated Creatinine Clearance: 86.3 mL/min (by C-G formula based on  SCr of 0.61 mg/dL).  Scheduled Meds:  amLODipine  10 mg Oral Daily   baclofen  10 mg Oral BID   clonazePAM  0.5 mg Oral BID   docusate sodium  100 mg Oral BID   enoxaparin (LOVENOX) injection  40 mg Subcutaneous Q24H   leptospermum manuka honey  1 Application Topical Daily   melatonin  10 mg Oral QHS   pantoprazole  40 mg Oral Daily   polyethylene glycol  17 g Oral BID   potassium chloride  20 mEq Oral BID   potassium chloride  40 mEq Oral Once   risperiDONE  0.5 mg Oral QHS   senna-docusate  1 tablet Oral BID   Continuous Infusions:  sodium chloride Stopped (11/02/22 1315)     LOS: 22 days    Time spent: 25 minutes   Shelda Pal, DO [facility] 11/06/2022, 11:48 AM   Available via Epic secure chat 7am-7pm After these hours, please refer to coverage provider listed on [email or web address]

## 2022-11-07 DIAGNOSIS — T50902A Poisoning by unspecified drugs, medicaments and biological substances, intentional self-harm, initial encounter: Secondary | ICD-10-CM | POA: Diagnosis not present

## 2022-11-07 DIAGNOSIS — G928 Other toxic encephalopathy: Secondary | ICD-10-CM | POA: Diagnosis not present

## 2022-11-07 MED ORDER — RISPERIDONE 0.5 MG PO TABS
0.2500 mg | ORAL_TABLET | Freq: Every day | ORAL | Status: DC
  Administered 2022-11-07 – 2022-11-16 (×10): 0.25 mg via ORAL
  Filled 2022-11-07 (×10): qty 1

## 2022-11-07 NOTE — Progress Notes (Signed)
Physical Therapy Treatment Patient Details Name: David Kim MRN: [id number] DOB: 1946-06-29 Today's Date: 11/07/2022   History of Present Illness David Kim is a 77 yo male with PMH paraplegia, severe MDD, persistent adjustment disorder with mixed anxiety and depressed mood ,PUD, gout, HTN, IBS, OSA, prostate cancer, GERD.   He was recently evaluated under IVC last month for expressing suicidal ideation. he was cleared for discharge home on 11/13.  Patient presented to [facility] long ED after home health aide discovered patient took an unknown amount of pills on 10/15/22    PT Comments    General Comments: AxO x 3 cognition at prior level.  Pt stated he no loger wants to die.  Pt is fearfu "what's to come" where he will go after hospital.  Pt unasure about discharge plans. Pt also expressed worry about "How will I file my taxes?".  Pt stated, he has no children.  "we tried by [name] couldn't get pregnant".  pt expressing a desire to "go home" but he lives alone,  so I discussed ST Rehab and the benefits. Assisted OOB to wheelchair.  General bed mobility comments: rolling side to side for peri care (incont BB) and applied brief.  Pt with increased ability to perform sidelying to EOB using bed rails and self scooting on bed pad. General transfer comment: Using a long sliding board, pt was able to self slide 75% after set up from elevated bed to wheelchair using bed pad to create "sliding". Wheelchair Mobility Wheelchair mobility: Yes Distance: 500 feet + self propeling with fewer rest breaks. Wheelchair Assistance Details (indicate cue type and reason): NO VC's needed.  Pt fully capable to navigate around corners and perfom 180 turns. Returned to room and remained in wheelchair for lunch.  Safety sitter present. Pt will need ST Rehab at SNF to address mobility and functional decline prior to safely returning home.   Recommendations for follow up therapy are one component of a multi-disciplinary  discharge planning process, led by the attending physician.  Recommendations may be updated based on patient status, additional functional criteria and insurance authorization.  Follow Up Recommendations  Skilled nursing-short term rehab (<3 hours/day) Can patient physically be transported by private vehicle: No   Assistance Recommended at Discharge Frequent or constant Supervision/Assistance  Patient can return home with the following A lot of help with walking and/or transfers;A lot of help with bathing/dressing/bathroom;Assist for transportation;Help with stairs or ramp for entrance   Equipment Recommendations  None recommended by PT    Recommendations for Other Services       Precautions / Restrictions Precautions Precautions: Fall Precaution Comments: paraplegia T12 , incontinence Restrictions Weight Bearing Restrictions: No Other Position/Activity Restrictions: L LE spastic/R LE flaccid     Mobility  Bed Mobility Overal bed mobility: Needs Assistance Bed Mobility: Rolling, Sidelying to Sit, Sit to Sidelying Rolling: Min assist Sidelying to sit: Max assist, Mod assist       General bed mobility comments: rolling side to side for peri care (incont BB) and applied brief.  Pt with increased ability to perform sidelying to EOB using bed rails and self scooting on bed pad.    Transfers Overall transfer level: Needs assistance Equipment used: Sliding board               General transfer comment: Using a long sliding board, pt was able to self slide 75% after set up from elevated bed to wheelchair using bed pad to create "sliding".    Ambulation/Gait  General Gait Details: NON amb.  Pt uses a wheelchair for mobility.   Stairs             Information systems manager mobility: Yes Distance: 500 feet + self propeling with fewer rest breaks. Wheelchair Assistance Details (indicate cue type and reason): NO VC's  needed.  Pt fully capable to navigate around corners and perfom 180 turns.  Modified Rankin (Stroke Patients Only)       Balance                                            Cognition Arousal/Alertness: Awake/alert Behavior During Therapy: WFL for tasks assessed/performed Overall Cognitive Status: Within Functional Limits for tasks assessed                                 General Comments: AxO x 3 cognition at prior level.  Pt stated he no loger wants to die.  Pt is fearfu "what's to come" where he will go after hospital.  Pt unasure about discharge plans. Pt also expressed worry about "How will I file my taxes?".  Pt stated, he has no children.  "we tried by [name] couldn't get pregnant".  pt expressing a desire to "go home" but he lives alone,  so I discussed ST Rehab and the benefits.        Exercises      General Comments        Pertinent Vitals/Pain Pain Assessment Pain Assessment: No/denies pain    Home Living                          Prior Function            PT Goals (current goals can now be found in the care plan section) Progress towards PT goals: Progressing toward goals    Frequency    Min 2X/week      PT Plan Current plan remains appropriate    Co-evaluation              AM-PAC PT "6 Clicks" Mobility   Outcome Measure  Help needed turning from your back to your side while in a flat bed without using bedrails?: A Lot Help needed moving from lying on your back to sitting on the side of a flat bed without using bedrails?: A Lot Help needed moving to and from a bed to a chair (including a wheelchair)?: A Lot Help needed standing up from a chair using your arms (e.g., wheelchair or bedside chair)?: Total Help needed to walk in hospital room?: Total Help needed climbing 3-5 steps with a railing? : Total 6 Click Score: 9    End of Session Equipment Utilized During Treatment: Gait belt Activity  Tolerance: Patient tolerated treatment well Patient left: Other (comment) (in wheelchair) Nurse Communication: Mobility status PT Visit Diagnosis: Other symptoms and signs involving the nervous system (A83.419)     Time: 6222-9798 PT Time Calculation (min) (ACUTE ONLY): 32 min  Charges:  $Therapeutic Activity: 8-22 mins $Wheel Chair Management: 8-22 mins                     Rica Koyanagi  PTA Acute  Rehabilitation Services Office M-F  [phone] Weekend pager [phone]

## 2022-11-07 NOTE — Progress Notes (Signed)
PROGRESS NOTE    David Kim  PJA:[id number] DOB: 30-Jul-1946 DOA: 10/15/2022 PCP: Biagio Borg, MD     Brief Narrative:  77 year old white male with paraplegia, severe depression, persistent adjustment disorder with anxiety, gout, hypertension, IBS, OSA, prostate cancer, and reflux.  Had an involuntary commitment last month after being depressed and having SI, his wife passed away in September 13, 2023.  He presented on 12/10 after a home health aide discovered he took an undisclosed amount of Klonopin.   During the patient's emergency department evaluation, discussions with EMS revealed there were opened bottles of losartan, tizanidine, oxybutynin, baclofen, and clonazepam.  Upon initial interview the patient endorsed at least taking clonazepam and tizanidine but would not provide additional information.  Initial evaluation also revealed multiple SIRS criteria with fever of 103.2 and urinalysis suggestive of urinary tract infection prompting initiation of intravenous ceftriaxone.  Patient was initially hospitalized in the stepdown unit for suspected drug overdose with initial concern for sepsis secondary to urinary tract infection.   Urine culture came back positive for pansensitive E. coli several days after hospitalization patient was also identified to have persisting left lower lobe infiltrate on 12/14 thought to be due to concurrent pneumonia.  Antibiotics were therefore broadened to cefepime and vancomycin on 12/14.  Clinical evidence of SIRS/sepsis resolved in the days that followed and course of therapy completed early in the morning on 12/19.   After several days of withholding all of the suspected overdosed agents patient began to exhibit extreme agitation tremulousness and hallucinations concerning for benzodiazepine withdrawal.  Patient was then placed back on scheduled benzodiazepines and baclofen on 12/13.     From 12/13 until 12/19 patient's mentation gradually improved. Degree of agitation  and confusion also gradually began to improve.  Extensive encephalopathy workup was undertaken including obtaining CT head, MRI brain and EEG all of which did not identify a contributing cause.   Psychiatry was also consulted on admission however their assessments have been obscured by ongoing encephalopathy felt to be due to both infection and drug overdose.   Due to guarded prognosis palliative care consultation was additionally obtained.  New events last 24 hours / Subjective: No acute changes, awaiting inpatient psychiatric placement. No complaints, tolerating diet  Assessment & Plan:   * Intentional overdose ([place]) Suspected intentional overdose, found to have open bottles of losartan, tizanidine, oxybutynin, baclofen and clonazepam by EMS.  Quantity is unclear.   Originally, all of patient's home medications were initially held due to the overdose  Patient was then resumed on benzodiazepines and baclofen on 12/13 due to concerns for withdrawal and have been slowly tapered since Continue sitter one-to-one safety precautions Will place IVC paperwork if patient attempts to elope. Psychiatry following, is assisting with management of delirium.  Their assistance is appreciated. Secure chat message to psych today ([facility]), they are planning on seeing today to discuss placement options Labs are currently normal, will plan to check weekly. Risperdal 0.5 mg daily Klonopin 0.5 mg twice daily   Sepsis ([place]) Resolved, initially sepsis thought to be secondary to E. coli urinary tract infection on admission.  Then, several days later  left lower lobe infiltrate noted on chest x-ray 12/14 as an additional source. Cefepime/vancomycin started on 12/19, completed early the morning of 12/19. Repeat blood cultures negative.   Acute hypoxemic respiratory failure (HCC)-resolved as of 10/16/2022 - suspect in setting of OD -Resolved,  currently back to room air   Toxic metabolic  encephalopathy Improved, was continued to have confusion  and hallucinations Multifactorial.  Multiple contributing causes including underlying infection (resolved), drug overdose (resolved) and subsequent withdrawal (also resolved). EEG obtained on 12/15 without obvious seizure activity although this was substantially obscured by ongoing benzodiazepine use. Neurology evaluated the case, repeat EEG was unremarkable on 12/20 MRI brain unremarkable Recent TSH, vitamin B12 unremarkable.   GIB (gastrointestinal bleeding) Episode of bright red blood per rectum on 12/12 followed by additional small episode morning of 12/13  Patient's status post 2 unit packed blood cell transfusion on 12/12.   GI Consulted, they felt  that susspected etiology is due to stercoral colitis with ulceration No evidence of GI bleeding since with stable hemoglobin and hematocrit.   Bacteremia due to Escherichia coli Antibiotic course completed early the morning of 12/19.   Stercoral colitis History of severe constipation.   In the past, patient's wife was primarily involved in performing regular rectal stimulation and disimpactions prior to her death Aggressive laxative regimen ordered with MiraLAX senna and Colace with patient now moving his bowels regularly CT imaging suggestive of stercoral colitis  Given his underlying paraplegia, suspect he has neurogenic bowel Disimpactions as needed   Metabolic acidosis Etiology suspected due to overdose Resolved   Severe recurrent major depression [facility]) Psychiatry following although patient is still suffering from some element of encephalopathy.  Their input is appreciated. Previous physicians had a long discussion with patient's niece, [name] on 12/12 and again today (12/19).  Patient has become severely depressed since his wife passed away in 2023-08-27.  She was essentially the patient's sole caretaker and he was completely dependent on her.  Since her passing, he has  began "shutting down"   Pressure injury of sacral region, stage 2 ([place]) Present on admission Daily dressing changes Frequent turns   Essential hypertension Amlodipine 10 mg daily  Hydralazine as needed     Paraplegia (HCC) Frequent turns to minimize development of pressure ulcerations Fall precautions   GERD Protonix 40 mg daily   Hypokalemia Resolved     Goals of care, counseling/discussion Goals of care discussion 12/12 with [name] patient's niece along with multiple other family members. No one has power of attorney.  Decision making for patient's care has been shared between the patient's son-in-law, daughter-in-law and niece. Discussed goals of care with niece and family on 12/14.  They are well aware of the patient's guarded prognosis.   Patient is currently DNR Palliative care has been consulted and is following along, their assistance is appreciated.   Elevated liver enzymes  Resolved   In agreement with assessment of the pressure ulcer as below:  Pressure Injury 10/16/22 Sacrum Anterior;Mid Unstageable - Full thickness tissue loss in which the base of the injury is covered by slough (yellow, tan, gray, green or brown) and/or eschar (tan, brown or black) in the wound bed. (Active)  10/16/22 0000  Location: Sacrum  Location Orientation: Anterior;Mid  Staging: Unstageable - Full thickness tissue loss in which the base of the injury is covered by slough (yellow, tan, gray, green or brown) and/or eschar (tan, brown or black) in the wound bed.  Wound Description (Comments):   Present on Admission: Yes  Dressing Type Foam - Lift dressing to assess site every shift 11/07/22 0850     Pressure Injury 10/16/22 Heel Left;Anterior Stage 1 -  Intact skin with non-blanchable redness of a localized area usually over a bony prominence. (Active)  10/16/22 0000  Location: Heel  Location Orientation: Left;Anterior  Staging: Stage 1 -  Intact skin  with non-blanchable redness  of a localized area usually over a bony prominence.  Wound Description (Comments):   Present on Admission: Yes  Dressing Type Foam - Lift dressing to assess site every shift 11/07/22 0850     Pressure Injury 10/24/22 Thigh Left;Posterior;Proximal Stage 2 -  Partial thickness loss of dermis presenting as a shallow open injury with a red, pink wound bed without slough. (Active)  10/24/22 1940  Location: Thigh  Location Orientation: Left;Posterior;Proximal  Staging: Stage 2 -  Partial thickness loss of dermis presenting as a shallow open injury with a red, pink wound bed without slough.  Wound Description (Comments):   Present on Admission:   Dressing Type Foam - Lift dressing to assess site every shift 11/07/22 0850   DVT prophylaxis: Lovenox Code Status: DNR Family Communication: None at bedside Coming From: Home Disposition Plan: Inpatient psychiatric admission Barriers to Discharge: Bed availability   Consultants:  Gastroenterology Psychiatry Neurology   Procedures:  EEG  Antimicrobials:  Anti-infectives (From admission, onward)    Start     Dose/Rate Route Frequency Ordered Stop   10/20/22 0800  vancomycin (VANCOREADY) IVPB 750 mg/150 mL        750 mg 150 mL/hr over 60 Minutes Intravenous Every 12 hours 10/19/22 1936 10/24/22 2200   10/19/22 1800  vancomycin (VANCOREADY) IVPB 1500 mg/300 mL        1,500 mg 150 mL/hr over 120 Minutes Intravenous  Once 10/19/22 1653 10/19/22 2047   10/19/22 1800  ceFEPIme (MAXIPIME) 2 g in sodium chloride 0.9 % 100 mL IVPB        2 g 200 mL/hr over 30 Minutes Intravenous Every 8 hours 10/19/22 1653 10/24/22 0339   10/17/22 1815  metroNIDAZOLE (FLAGYL) IVPB 500 mg  Status:  Discontinued        500 mg 100 mL/hr over 60 Minutes Intravenous 2 times daily 10/17/22 1758 10/18/22 1135   10/16/22 2200  cefTRIAXone (ROCEPHIN) 2 g in sodium chloride 0.9 % 100 mL IVPB  Status:  Discontinued        2 g 200 mL/hr over 30 Minutes Intravenous Every 24  hours 10/16/22 1307 10/19/22 1636   10/16/22 2100  cefTRIAXone (ROCEPHIN) 1 g in sodium chloride 0.9 % 100 mL IVPB  Status:  Discontinued        1 g 200 mL/hr over 30 Minutes Intravenous Every 24 hours 10/15/22 2335 10/16/22 1307   10/15/22 2145  cefTRIAXone (ROCEPHIN) 2 g in sodium chloride 0.9 % 100 mL IVPB        2 g 200 mL/hr over 30 Minutes Intravenous  Once 10/15/22 2134 10/15/22 2223        Objective: Vitals:   11/06/22 1333 11/06/22 1941 11/07/22 0512 11/07/22 1314  BP: 113/68 122/69 119/86 126/70  Pulse: 94 96 91 (!) 105  Resp: '20 16 16 18  '$ Temp: 98.3 F (36.8 C) 98.7 F (37.1 C) 99.1 F (37.3 C) 98.6 F (37 C)  TempSrc: Oral Oral Oral Oral  SpO2: 99% 97% 97% 99%  Weight:      Height:        Intake/Output Summary (Last 24 hours) at 11/07/2022 1449 Last data filed at 11/07/2022 0858 Gross per 24 hour  Intake 480 ml  Output 825 ml  Net -345 ml   Filed Weights   10/15/22 2347 10/23/22 0000  Weight: 82.2 kg 85.7 kg    Examination: General exam: Appears calm and comfortable  Respiratory system: Clear to auscultation. Respiratory effort normal.  No respiratory distress. No conversational dyspnea.  Cardiovascular system: S1 & S2 heard, RRR. No murmurs. Gastrointestinal system: Abdomen is nondistended, soft and nontender. Normal bowel sounds heard. Skin: No rashes, lesions or ulcers on exposed skin  Psychiatry: Mood & affect appropriate.   Data Reviewed: I have personally reviewed following labs and imaging studies  CBC: Recent Labs  Lab 11/04/22 0651  WBC 8.1  HGB 14.4  HCT 44.8  MCV 92.6  PLT 045   Basic Metabolic Panel: Recent Labs  Lab 11/02/22 0604 11/04/22 0651  NA 140 139  K 3.1* 3.6  CL 106 107  CO2 25 27  GLUCOSE 123* 119*  BUN 42* 33*  CREATININE 0.60* 0.61  CALCIUM 8.7* 8.6*   GFR: Estimated Creatinine Clearance: 86.3 mL/min (by C-G formula based on SCr of 0.61 mg/dL).  Scheduled Meds:  amLODipine  10 mg Oral Daily   baclofen  10  mg Oral BID   clonazePAM  0.5 mg Oral BID   docusate sodium  100 mg Oral BID   enoxaparin (LOVENOX) injection  40 mg Subcutaneous Q24H   leptospermum manuka honey  1 Application Topical Daily   melatonin  10 mg Oral QHS   pantoprazole  40 mg Oral Daily   polyethylene glycol  17 g Oral BID   potassium chloride  20 mEq Oral BID   potassium chloride  40 mEq Oral Once   risperiDONE  0.5 mg Oral QHS   senna-docusate  1 tablet Oral BID   Continuous Infusions:  sodium chloride Stopped (11/02/22 1315)     LOS: 23 days    Time spent: 25 minutes   Desma Maxim, MD [facility] 11/07/2022, 2:49 PM   Available via Epic secure chat 7am-7pm After these hours, please refer to coverage provider listed on [email or web address]

## 2022-11-07 NOTE — Care Management Important Message (Signed)
Important Message  Patient Details IM Letter given. Name: David Kim MRN: [id number] Date of Birth: 12-12-1945   Medicare Important Message Given:  Yes     Kerin Salen 11/07/2022, 11:23 AM

## 2022-11-07 NOTE — Consult Note (Addendum)
[facility] Face-to-Face Psychiatry Consult   Reason for Consult:  suicidal attempt Referring Physician:  Dr. Cyd Silence Patient Identification: David Kim MRN:  [id number] Principal Diagnosis: Intentional overdose [facility]) Diagnosis:  Principal Problem:   Intentional overdose ([place]) Active Problems:   Severe recurrent major depression ([place])   Paraplegia ([place])   Essential hypertension   GERD   Toxic metabolic encephalopathy   Goals of care, counseling/discussion   Sepsis ([place])   Metabolic acidosis   Bacteremia due to Escherichia coli   Pressure injury of sacral region, stage 2 ([place])   Stercoral colitis   GIB (gastrointestinal bleeding)   Hypokalemia   Stercoral ulcer of rectum   Abnormal finding on GI tract imaging   Acute blood loss anemia   Chronic constipation   Total Time spent with patient: 45 minutes  Subjective:   David Kim is a 77 y.o. male patient admitted after suicide attempt on multiple sedative/hypnotic type medications.   HPI: Patient seen in his room, watching TV. Sitter is at the bedside. Patient is alert, oriented in self, place, month, year.  He describes his mood as "pretty good".  He denies any depression, anxiety, or trauma symptoms at this time. He continues to deny suicidal ideation, and or any plans to attempt suicide in the near future.  He states "If I am going to die I will just die. I can die any my sleep or at any time. I haven't worn my CPAP in weeks, I could die in my sleep from not wearing my CPAP."  He is observed to be sitting upright in his bedside chair, bedside table within reach.  He remains under suicide precautions due to extent of suicide attempt.  He denies any delusions and or hallucinations during my reassessment today.  He states his appetite is good, and denied any difficulty with sleep at this time.  Patient continues to meet inpatient psychiatric criteria at this time, will begin to transition to long term care.  He is now medically stable  for transfer to inpatient psychiatric facility.  He does provide consent to contact [facility] regarding placement, and continues to express clear interest in returning home with his 24-hour nursing coverage.  Did discuss with patient possibility of a long-term care facility, as we currently have been searching inpatient psychiatric bed availability x 2 weeks to no avail.  While he does not agree with this choice, he does appear to be future oriented and optimistic.  Did update [name] about change in recommendation from inpatient psychiatry to long-term care facility.  Will continue to monitor, however if patient remains consistent with denial of suicidal ideations or suicidal intent, will likely psychiatrically clear.  Only verbalizes understanding.  No additional questions or concerns have been answered.  She reports being under the weather, plans to visit Wednesday or Thursday.  She was able to assist with his financial requirements such as paying of bills, sign checks in which patient provided assent to endorse personal checks in order to pay his bills for January.  At the same time patient did talk about being discharged from hospital due to tax season, and needing to begin working on taxes before was too late.  Past Medical History:  Past Medical History:  Diagnosis Date   Allergic rhinitis    ALLERGIC RHINITIS 10/01/2007   Qualifier: Diagnosis of  By: Jenny Reichmann MD, Hunt Oris    Anxiety    Carpal tunnel syndrome    right   Depression    Depression  Erectile dysfunction    Fatigue    GERD (gastroesophageal reflux disease)    Gout    Hypertension    HYPOGONADISM 03/31/2008   Qualifier: Diagnosis of  By: Jenny Reichmann MD, Hunt Oris    IBS (irritable bowel syndrome)    Low back pain    OSA (obstructive sleep apnea)    Paraplegia ([place]) 04/23/2009   Qualifier: Diagnosis of  By: Jenny Reichmann MD, Hunt Oris    Peptic ulcer disease    PEPTIC ULCER DISEASE 10/01/2007   Qualifier: Diagnosis of  By: Jenny Reichmann MD, Hunt Oris     Prostate cancer [facility])    PROSTATE CANCER, HX OF 05/23/2007   Qualifier: Diagnosis of  By: Jenny Reichmann MD, [place] PARALYSIS 04/23/2009   Qualifier: Diagnosis of  By: Jenny Reichmann MD, Hunt Oris    Thoracic spinal cord injury ([place]) 03/12/2012    Past Surgical History:  Procedure Laterality Date   ARTERY AND TENDON REPAIR Left 09/06/2018   Procedure: ARTERY AND TENDON REPAIR;  Surgeon: Charlotte Crumb, MD;  Location: [place];  Service: Orthopedics;  Laterality: Left;   CAST APPLICATION Left 81/06/5630   Procedure: CAST APPLICATION;  Surgeon: Charlotte Crumb, MD;  Location: [place];  Service: Orthopedics;  Laterality: Left;   inguinal heniorrhaphy     left leg surgery after fibula     NERVE REPAIR Left 09/06/2018   Procedure: NERVE REPAIR TIMES TWO;  Surgeon: Charlotte Crumb, MD;  Location: [place];  Service: Orthopedics;  Laterality: Left;   OPEN REDUCTION INTERNAL FIXATION (ORIF) DISTAL PHALANX Left 09/06/2018   Procedure: OPEN REDUCTION INTERNAL FIXATION (ORIF) LEFT LONG FINGER;  Surgeon: Charlotte Crumb, MD;  Location: [place];  Service: Orthopedics;  Laterality: Left;   PILONIDAL CYST / SINUS EXCISION     PROSTATECTOMY     tonsillectomy     WOUND EXPLORATION Left 09/06/2018   Procedure: EXPLORATION OFCOMPLEX INJURY;  Surgeon: Charlotte Crumb, MD;  Location: [place];  Service: Orthopedics;  Laterality: Left;   Family History:  Family History  Problem Relation Age of Onset   High blood pressure Mother    Dementia Mother    Diabetes Father     Social History:  Social History   Substance and Sexual Activity  Alcohol Use No     Social History   Substance and Sexual Activity  Drug Use No    Social History   Socioeconomic History   Marital status: Married    Spouse name: [name]   Number of children: 0   Years of education: 12   Highest education level: Not on file  Occupational History   Occupation: retired    Fish farm manager: RETIRED  Tobacco Use   Smoking status: Never   Smokeless  tobacco: Never  Vaping Use   Vaping Use: Never used  Substance and Sexual Activity   Alcohol use: No   Drug use: No   Sexual activity: Not on file  Other Topics Concern   Not on file  Social History Narrative   Patient lives at home with [name]). Patient is disabled. Patient has 12 th grade education.   Right handed.   Caffeine- None   Social Determinants of Health   Financial Resource Strain: Low Risk  (07/24/2022)   Overall Financial Resource Strain (CARDIA)    Difficulty of Paying Living Expenses: Not hard at all  Food Insecurity: No Food Insecurity (10/20/2022)   Hunger Vital Sign    Worried About Running Out of Food in the  Last Year: Never true    Vera in the Last Year: Never true  Transportation Needs: No Transportation Needs (10/20/2022)   PRAPARE - Hydrologist (Medical): No    Lack of Transportation (Non-Medical): No  Physical Activity: Inactive (07/24/2022)   Exercise Vital Sign    Days of Exercise per Week: 0 days    Minutes of Exercise per Session: 0 min  Stress: No Stress Concern Present (07/24/2022)   [place]    Feeling of Stress : Not at all  Social Connections: Moderately Isolated (07/24/2022)   Social Connection and Isolation Panel [NHANES]    Frequency of Communication with Friends and Family: More than three times a week    Frequency of Social Gatherings with Friends and Family: More than three times a week    Attends Religious Services: Never    Marine scientist or Organizations: No    Attends Archivist Meetings: Never    Marital Status: Married   Additional Social History:    Allergies:   Allergies  Allergen Reactions   Amoxicillin Other (See Comments)    Unknown    Endal Hd Other (See Comments)    Unknown     Labs:  No results found for this or any previous visit (from the past 48 hour(s)).   Current  Facility-Administered Medications  Medication Dose Route Frequency Provider Last Rate Last Admin   0.9 %  sodium chloride infusion   Intravenous PRN Shalhoub, Sherryll Burger, MD   Paused at 11/02/22 1315   acetaminophen (TYLENOL) tablet 650 mg  650 mg Oral Q6H PRN Dwyane Dee, MD   650 mg at 10/17/22 4431   Or   acetaminophen (TYLENOL) suppository 650 mg  650 mg Rectal Q6H PRN Dwyane Dee, MD   650 mg at 10/17/22 1526   amLODipine (NORVASC) tablet 10 mg  10 mg Oral Daily Shalhoub, Sherryll Burger, MD   10 mg at 11/07/22 1133   baclofen (LIORESAL) tablet 10 mg  10 mg Oral BID Vernelle Emerald, MD   10 mg at 11/07/22 1133   clonazePAM (KLONOPIN) tablet 0.5 mg  0.5 mg Oral BID Vernelle Emerald, MD   0.5 mg at 11/07/22 1134   docusate sodium (COLACE) capsule 100 mg  100 mg Oral BID Mansouraty, Telford Nab., MD   100 mg at 11/07/22 1134   enoxaparin (LOVENOX) injection 40 mg  40 mg Subcutaneous Q24H Dwyane Dee, MD   40 mg at 11/07/22 1134   haloperidol (HALDOL) tablet 2 mg  2 mg Oral BID PRN Cinderella, Margaret A       And   LORazepam (ATIVAN) tablet 1 mg  1 mg Oral BID PRN Cinderella, Margaret A       hydrALAZINE (APRESOLINE) injection 10 mg  10 mg Intravenous Q6H PRN Shalhoub, Sherryll Burger, MD       leptospermum manuka honey (MEDIHONEY) paste 1 Application  1 Application Topical Daily Dwyane Dee, MD   1 Application at 54/00/86 1134   melatonin tablet 10 mg  10 mg Oral QHS Vernelle Emerald, MD   10 mg at 11/06/22 2240   Oral care mouth rinse  15 mL Mouth Rinse PRN Shalhoub, Sherryll Burger, MD       pantoprazole (PROTONIX) EC tablet 40 mg  40 mg Oral Daily Shalhoub, Sherryll Burger, MD   40 mg at 11/07/22 1133   polyethylene glycol (MIRALAX /  GLYCOLAX) packet 17 g  17 g Oral BID E[place], Amy S, PA-C   17 g at 11/07/22 1142   polyvinyl alcohol (LIQUIFILM TEARS) 1.4 % ophthalmic solution 1 drop  1 drop Both Eyes PRN Dwyane Dee, MD   1 drop at 10/17/22 2229   potassium chloride SA (KLOR-CON M) CR tablet 20  mEq  20 mEq Oral BID Dibia, Manfred Shirts, MD   20 mEq at 11/07/22 1133   potassium chloride SA (KLOR-CON M) CR tablet 40 mEq  40 mEq Oral Once [place], Marge Duncans, MD       risperiDONE (RISPERDAL) tablet 0.5 mg  0.5 mg Oral QHS Derrill Center, NP   0.5 mg at 11/06/22 2240   senna-docusate (Senokot-S) tablet 1 tablet  1 tablet Oral BID Dwyane Dee, MD   1 tablet at 11/07/22 1133   tiZANidine (ZANAFLEX) tablet 4 mg  4 mg Oral Q6H PRN Vernelle Emerald, MD   4 mg at 10/25/22 0845    Musculoskeletal: Strength & Muscle Tone: decreased Gait & Station: unable to stand Patient leans: N/A   Psychiatric Specialty Exam:   Appearance:  CM, wearing hospital clothes, with ok grooming and hygiene. Appropriate level of alertness .  Attitude/Behavior: engaging with no eye contact.  Motor: WNL; dyskinesias not evident.   Speech: clear, coherent  Mood: appears euthymic  Affect: blunted  Thought process: patient appears still disorganized and illogical, associations are not appropriate.  Thought content: patient appears delusional, he denies suicidal thoughts, denies homicidal thoughts.  Thought perception: patient possible experiences visual hallucinations.   Cognition: patient is alert and partially-oriented in self, place, month, not in situation; with impaired attention and concentration.  Insight: poor  Judgement: poor   Physical Exam: Physical Exam Vitals and nursing note reviewed.  Constitutional:      Appearance: Normal appearance. He is obese.  Skin:    Capillary Refill: Capillary refill takes less than 2 seconds.  Neurological:     General: No focal deficit present.     Mental Status: He is alert and oriented to person, place, and time. Mental status is at baseline.  Psychiatric:        Mood and Affect: Mood normal.        Behavior: Behavior normal.        Thought Content: Thought content normal.        Judgment: Judgment normal.    Review of Systems   Psychiatric/Behavioral:  Positive for depression. Negative for hallucinations, memory loss, substance abuse and suicidal ideas. The patient is not nervous/anxious and does not have insomnia.    Blood pressure 126/70, pulse (!) 105, temperature 98.6 F (37 C), temperature source Oral, resp. rate 18, height '5\' 8"'$  (1.727 m), weight 85.7 kg, SpO2 99 %. Body mass index is 28.73 kg/m.  Treatment Plan Summary: Daily contact with patient to assess and evaluate symptoms and progress in treatment and Medication management  Assessment and Plan: Patient is a 77 y.o. male patient admitted after suicide attempt on multiple sedative/hypnotic type medications.   Patient continues to show much improvement in progression.  Continue delirium precautions: Minimize use of benzodiazepines, anticholinergics, and opiates, to the extent possible, as these can worsen mentation. Utilize non-pharmacological interventions including frequent reorientation, maintaining day/night distinction (blinds closed during night, open during day), minimizing excessive stimulation, staff continuity, reorient the patient frequently, provide easily visible clock and calendar, provide sensory aids like glasses, hearing aids, encourage ambulation, regular activities and visitors to maintain cognitive stimulation.  Reduce Risperdal 0.25 mg nightly Continue Klonopin 0.5 mg p.o. twice daily as needed See chart for agitation protocol with Haldol and Ativan.   Disposition: Recommend psychiatric Inpatient admission when medically cleared.   -Currently on wait list for [place], FNP 11/07/2022 3:58 PM

## 2022-11-07 NOTE — Progress Notes (Signed)
Occupational Therapy Treatment Patient Details Name: David Kim MRN: [id number] DOB: [birth year] Today's Date: 11/07/2022   History of present illness [name] is a 77 yo male with PMH paraplegia, severe MDD, persistent adjustment disorder with mixed anxiety and depressed mood ,PUD, gout, HTN, IBS, OSA, prostate cancer, GERD.   He was recently evaluated under IVC last month for expressing suicidal ideation. he was cleared for discharge home on 11/13.  Patient presented to [facility] long ED after home health aide discovered patient took an unknown amount of pills on 10/15/22   OT comments  Patient was seen for functional transfer training and functional strengthening needed to facilitate progressive ADL performance. He was received seated in the manual wheelchair and was motivated to participate in the session. He was instructed on performing a slide board transfer from wheelchair to bedside chair. He required min to mod assist, with assist needed for proper set-up of slide board, correct positioning of w/c and bedside chair, and intermittent cues for best transfer technique. Once in the chair, he was assisted with positioning. He will continue to benefit from OT services to maximize his safety and independence with self-care tasks.    Recommendations for follow up therapy are one component of a multi-disciplinary discharge planning process, led by the attending physician.  Recommendations may be updated based on patient status, additional functional criteria and insurance authorization.    Follow Up Recommendations  Skilled nursing-short term rehab (<3 hours/day)     Assistance Recommended at Discharge Frequent or constant Supervision/Assistance  Patient can return home with the following  A lot of help with walking and/or transfers;A lot of help with bathing/dressing/bathroom;Assistance with cooking/housework;Direct supervision/assist for medications management;Direct supervision/assist for  financial management;Assist for transportation;Help with stairs or ramp for entrance   Equipment Recommendations  None recommended by OT       Precautions / Restrictions Precautions Precautions: Fall Precaution Comments: paraplegia T12 , incontinence Restrictions Weight Bearing Restrictions: No Other Position/Activity Restrictions: L LE spastic/R LE flaccid       Mobility   Transfers Overall transfer level: Needs assistance Equipment used: Sliding board Transfers: Bed to chair/wheelchair/BSC             General transfer comment: Pt was instructed on performing a slide board transfer from wheelchair to bedside chair. He required min to mod assist, with assist needed for proper set-up of slide board, correct positioning of w/c and bedside chair, and intermittent cues for best transfer technique         ADL either performed or assessed with clinical judgement   ADL Overall ADL's : Needs assistance/impaired Eating/Feeding: Independent;Sitting   Grooming: Set up;Sitting           Upper Body Dressing : Set up;Sitting   Lower Body Dressing: Maximal assistance;Bed level                       Cognition Arousal/Alertness: Awake/alert Behavior During Therapy: WFL for tasks assessed/performed Overall Cognitive Status: Within Functional Limits for tasks assessed                        Pertinent Vitals/ Pain       Pain Assessment Pain Assessment: No/denies pain         Frequency  Min 2X/week        Progress Toward Goals  OT Goals(current goals can now be found in the care plan section)  Progress towards OT goals: Progressing toward  goals  Acute Rehab OT Goals OT Goal Formulation: With patient Time For Goal Achievement: 11/08/22 Potential to Achieve Goals: Good   AM-PAC OT "6 Clicks" Daily Activity     Outcome Measure   Help from another person eating meals?: None Help from another person taking care of personal grooming?: A  Little Help from another person toileting, which includes using toliet, bedpan, or urinal?: A Lot Help from another person bathing (including washing, rinsing, drying)?: A Lot Help from another person to put on and taking off regular upper body clothing?: A Little Help from another person to put on and taking off regular lower body clothing?: A Lot 6 Click Score: 16    End of Session    OT Visit Diagnosis: Muscle weakness (generalized) (M62.81)   Activity Tolerance Patient tolerated treatment well   Patient Left in chair;with call bell/phone within reach;with nursing/sitter in room   Nurse Communication Mobility status        Time: 1115-5208 OT Time Calculation (min): 15 min  Charges: OT General Charges $OT Visit: 1 Visit OT Treatments $Therapeutic Activity: 8-22 mins     Leota Sauers, OTR/L 11/07/2022, 1:25 PM

## 2022-11-08 DIAGNOSIS — T50902A Poisoning by unspecified drugs, medicaments and biological substances, intentional self-harm, initial encounter: Secondary | ICD-10-CM | POA: Diagnosis not present

## 2022-11-08 NOTE — Progress Notes (Signed)
PROGRESS NOTE David Kim  NAT:[id number] DOB: 10/31/1946 DOA: 10/15/2022 PCP: Biagio Borg, MD   Brief Narrative/Hospital Course: 73 yom w/ paraplegia, severe MDD, persistent adjustment disorder with mixed anxiety and depressed mood ,PUD, gout, HTN, IBS, OSA, prostate cancer, GERD who was recently evaluated under IVC last month after being depressed and expressing suicidal ideation,was treated by psychiatry on their service and cleared for discharge home with decrease in his Klonopin dose on 11/13 but he presented to [facility] long hospital ED after home health aide discovered patient took an unknown amount of pills.   In ED- EMS revealed there were opened bottles of losartan, tizanidine, oxybutynin, baclofen, and clonazepam. Upon initial interview the patient endorsed at least taking clonazepam and tizanidine but would not provide additional information.  Initial evaluation also revealed multiple SIRS criteria with fever of 103.2 and urinalysis suggestive of urinary tract infection prompting initiation of intravenous ceftriaxone.  Patient was initially hospitalized in the stepdown unit for suspected drug overdose with initial concern for sepsis secondary to urinary tract infection.  Urine culture came back positive for pansensitive E. coli several days after hospitalization patient was also identified to have persisting left lower lobe infiltrate on 12/14 thought to be due to concurrent pneumonia.  Antibiotics were therefore broadened to cefepime and vancomycin on 12/14.  Clinical evidence of SIRS/sepsis resolved in the days that followed and course of therapy completed early in the morning on 12/19.  After several days of withholding all of the suspected overdosed agents patient began to exhibit extreme agitation tremulousness and hallucinations concerning for benzodiazepine withdrawal.  Patient was then placed back on scheduled benzodiazepines and baclofen on 12/13.    From 12/13 until 12/19  patient's mentation gradually improved. Degree of agitation and confusion also gradually began to improve.  Extensive encephalopathy workup was undertaken including obtaining CT head, MRI brain and EEG all of which did not identify a contributing cause.  Psychiatry was also consulted on admission however their assessments have been obscured by ongoing encephalopathy felt to be due to both infection and drug overdose.  Due to guarded prognosis palliative care consultation was additionally obtained.   Subjective: Seen and examined He is resting comfortably has no complaints. Sitter at the bedside.   Assessment and Plan: Principal Problem:   Intentional overdose ([place]) Active Problems:   Sepsis ([place])   Toxic metabolic encephalopathy   Bacteremia due to Escherichia coli   GIB (gastrointestinal bleeding)   Metabolic acidosis   Stercoral colitis   Severe recurrent major depression (HCC)   Pressure injury of sacral region, stage 2 (HCC)   Essential hypertension   Paraplegia (HCC)   GERD   Hypokalemia   Goals of care, counseling/discussion   Stercoral ulcer of rectum   Abnormal finding on GI tract imaging   Acute blood loss anemia   Chronic constipation   Intentional overdose:found to have open bottles of losartan, tizanidine, oxybutynin, baclofen and clonazepam by EMS.  Quantity is unclear.  Originally, all of patient's home medications were initially held due to the overdose.Patient was then resumed on benzodiazepines and baclofen on 12/13 due to concerns for withdrawal and have been slowly tapered since. Continue sitter one-to-one safety precautions and needs IVC paperwork if if patient attempts to elope.  Psychiatry input appreciated following closely continue ongoing management of delirium, awaiting for inpatient psych> continue current Klonopin twice daily, baclofen, melatonin, Risperdal bedtime   Sepsis E. coli bacteremia: Initially sepsis due to E. coli UTI, resolved. Several days  later  left lower lobe infiltrate noted on chest x-ray 12/14 as an additional source.cefepime/vancomycin started on 12/19, completed early the morning of 12/19.Repeat blood cultures negative.  Acute hypoxic respite failure resolved due to OD  Toxic metabolic encephalopathy mental status currently improved.He was continued to have confusion and hallucinations. multifactorial.  Multiple contributing causes including underlying infection (resolved), drug overdose (resolved) and subsequent withdrawal (also resolved).EEG obtained on 12/15 without obvious seizure activity although this was substantially obscured by ongoing benzodiazepine use. Neurology evaluated the case, repeat EEG was unremarkable on 12/20.MRI brain unremarkable,Recent TSH, vitamin B12 unremarkable.   Sercoral colitis History of severe constipation GI bleeding episodes per rectum 12/12 followed by additional small episodes morning of 12/13. S/p  2 u PRBC on 12/12.  Seen by GI they susspected etiology is due to stercoral colitis with ulceration.No evidence of GI bleeding since with stable hemoglobin and hematocrit.patient's wife was primarily involved in performing regular rectal stimulation and disimpactions prior to her death. Cont aggressive laxative regimen- MiraLAX senna and Colace.CT imaging suggestive of stercoral colitis.Given his underlying paraplegia, suspect he has neurogenic bowel. Disimpactions as needed   Metabolic acidosis: Resolved.   Severe recurrent major depression:Psychiatry following although patient is still suffering from some element of encephalopathy.  Their input is appreciated..Previous physicians had a long discussion with patient's niece, [name] on 12/12 and again today (12/19).  Patient has become severely depressed since his wife passed away in 09-11-23.  She was essentially the patient's sole caretaker and he was completely dependent on her.  Since her passing, he has began "shutting down" GERD continue  PPI Hypokalemia repleted Essential hypertension BP stable continue amlodipine Paraplegia frequent turning position changing baclofen supportive care to be continued Pressure injury sacral stage II POA continue dressing change frequent turns Pressure Injury 10/16/22 Sacrum Anterior;Mid Unstageable - Full thickness tissue loss in which the base of the injury is covered by slough (yellow, tan, gray, green or brown) and/or eschar (tan, brown or black) in the wound bed. (Active)  10/16/22 0000  Location: Sacrum  Location Orientation: Anterior;Mid  Staging: Unstageable - Full thickness tissue loss in which the base of the injury is covered by slough (yellow, tan, gray, green or brown) and/or eschar (tan, brown or black) in the wound bed.  Wound Description (Comments):   Present on Admission: Yes     Pressure Injury 10/16/22 Heel Left;Anterior Stage 1 -  Intact skin with non-blanchable redness of a localized area usually over a bony prominence. (Active)  10/16/22 0000  Location: Heel  Location Orientation: Left;Anterior  Staging: Stage 1 -  Intact skin with non-blanchable redness of a localized area usually over a bony prominence.  Wound Description (Comments):   Present on Admission: Yes     Pressure Injury 10/24/22 Thigh Left;Posterior;Proximal Stage 2 -  Partial thickness loss of dermis presenting as a shallow open injury with a red, pink wound bed without slough. (Active)  10/24/22 1940  Location: Thigh  Location Orientation: Left;Posterior;Proximal  Staging: Stage 2 -  Partial thickness loss of dermis presenting as a shallow open injury with a red, pink wound bed without slough.  Wound Description (Comments):   Present on Admission:     Goals of care, counseling/discussion:Goals of care discussion 12/12 with [name] patient's niece along with multiple other family members. No one has power of attorney.  Decision making for patient's care has been shared between the patient's  son-in-law, daughter-in-law and niece. Discussed goals of care with niece and family on 12/14.  They are well aware of the patient's guarded prognosis.   Patient is currently DNR Palliative care has been consulted and is following along, their assistance is appreciated. DVT prophylaxis: enoxaparin (LOVENOX) injection 40 mg Start: 10/16/22 1000 Code Status:   Code Status: DNR Family Communication: plan of care discussed with patient at bedside. Patient status is: Inpatient because of awaiting inpatient psych Level of care: Progressive   Dispo: The patient is from: home            Anticipated disposition: inpatient psych in  [place] (last 72 hours)     Mobility Assessment     Row Name 11/07/22 1940 11/07/22 1234 11/07/22 1233 11/07/22 0850 11/06/22 3810   Does patient have an order for bedrest or is patient medically unstable -- -- -- No - Continue assessment No - Continue assessment   What is the highest level of mobility based on the progressive mobility assessment? Level 1 (Bedfast) - Unable to balance while sitting on edge of bed Level 1 (Bedfast) - Unable to balance while sitting on edge of bed Level 1 (Bedfast) - Unable to balance while sitting on edge of bed Level 1 (Bedfast) - Unable to balance while sitting on edge of bed Level 1 (Bedfast) - Unable to balance while sitting on edge of bed   Is the above level different from baseline mobility prior to current illness? -- -- -- No - Consider discontinuing PT/OT No - Consider discontinuing PT/OT    Row Name 11/05/22 1413           What is the highest level of mobility based on the progressive mobility assessment? Level 1 (Bedfast) - Unable to balance while sitting on edge of bed                 Objective: Vitals last 24 hrs: Vitals:   11/07/22 0512 11/07/22 1314 11/07/22 2127 11/08/22 0557  BP: 119/86 126/70 125/69 120/74  Pulse: 91 (!) 105 90 87  Resp: '16 18 18 18  '$ Temp: 99.1 F (37.3 C) 98.6 F (37 C) 98.4  F (36.9 C) 98 F (36.7 C)  TempSrc: Oral Oral Oral Oral  SpO2: 97% 99% 97% 98%  Weight:      Height:       Weight change:   Physical Examination: General exam: alert awake, older than stated age HEENT:Oral mucosa moist, Ear/Nose WNL grossly Respiratory system: bilaterally clear BS, no use of accessory muscle Cardiovascular system: S1 & S2 +, No JVD. Gastrointestinal system: Abdomen soft,NT,ND, BS+ Nervous System:Alert, awake, moving extremities. Extremities: LE edema neg,distal peripheral pulses palpable.  Skin: No rashes,no icterus. MSK: Normal muscle bulk,tone, power  Medications reviewed:  Scheduled Meds:  amLODipine  10 mg Oral Daily   baclofen  10 mg Oral BID   clonazePAM  0.5 mg Oral BID   docusate sodium  100 mg Oral BID   enoxaparin (LOVENOX) injection  40 mg Subcutaneous Q24H   leptospermum manuka honey  1 Application Topical Daily   melatonin  10 mg Oral QHS   pantoprazole  40 mg Oral Daily   polyethylene glycol  17 g Oral BID   potassium chloride  20 mEq Oral BID   potassium chloride  40 mEq Oral Once   risperiDONE  0.25 mg Oral QHS   senna-docusate  1 tablet Oral BID  Continuous Infusions:  sodium chloride Stopped (11/02/22 1315)   Diet Order             Diet regular  Room service appropriate? Yes; Fluid consistency: Thin  Diet effective now                  Intake/Output Summary (Last 24 hours) at 11/08/2022 1046 Last data filed at 11/08/2022 0900 Gross per 24 hour  Intake 720 ml  Output 1050 ml  Net -330 ml   Net IO Since Admission: -3,548.47 mL [11/08/22 1046]  Wt Readings from Last 3 Encounters:  10/23/22 85.7 kg  07/24/22 86.2 kg  07/18/21 86.2 kg     Unresulted Labs (From admission, onward)     Start     Ordered   11/11/22 0500  CBC  Once-Timed,   R       Question:  Specimen collection method  Answer:  Lab=Lab collect   11/05/22 1007   11/11/22 3086  Basic metabolic panel  Once-Timed,   R       Question:  Specimen collection method   Answer:  Lab=Lab collect   11/05/22 1007   10/22/22 0500  CBC with Differential/Platelet  Tomorrow morning,   R       Question:  Specimen collection method  Answer:  Lab=Lab collect   10/21/22 1821   10/21/22 0500  CBC with Differential/Platelet  Tomorrow morning,   R       Question:  Specimen collection method  Answer:  Lab=Lab collect   10/20/22 2041          Data Reviewed: I have personally reviewed following labs and imaging studies CBC: Recent Labs  Lab 11/04/22 0651  WBC 8.1  HGB 14.4  HCT 44.8  MCV 92.6  PLT 578   Basic Metabolic Panel: Recent Labs  Lab 11/02/22 0604 11/04/22 0651  NA 140 139  K 3.1* 3.6  CL 106 107  CO2 25 27  GLUCOSE 123* 119*  BUN 42* 33*  CREATININE 0.60* 0.61  CALCIUM 8.7* 8.6*   No results found for this or any previous visit (from the past 240 hour(s)).  Antimicrobials: Anti-infectives (From admission, onward)    Start     Dose/Rate Route Frequency Ordered Stop   10/20/22 0800  vancomycin (VANCOREADY) IVPB 750 mg/150 mL        750 mg 150 mL/hr over 60 Minutes Intravenous Every 12 hours 10/19/22 1936 10/24/22 2200   10/19/22 1800  vancomycin (VANCOREADY) IVPB 1500 mg/300 mL        1,500 mg 150 mL/hr over 120 Minutes Intravenous  Once 10/19/22 1653 10/19/22 2047   10/19/22 1800  ceFEPIme (MAXIPIME) 2 g in sodium chloride 0.9 % 100 mL IVPB        2 g 200 mL/hr over 30 Minutes Intravenous Every 8 hours 10/19/22 1653 10/24/22 0339   10/17/22 1815  metroNIDAZOLE (FLAGYL) IVPB 500 mg  Status:  Discontinued        500 mg 100 mL/hr over 60 Minutes Intravenous 2 times daily 10/17/22 1758 10/18/22 1135   10/16/22 2200  cefTRIAXone (ROCEPHIN) 2 g in sodium chloride 0.9 % 100 mL IVPB  Status:  Discontinued        2 g 200 mL/hr over 30 Minutes Intravenous Every 24 hours 10/16/22 1307 10/19/22 1636   10/16/22 2100  cefTRIAXone (ROCEPHIN) 1 g in sodium chloride 0.9 % 100 mL IVPB  Status:  Discontinued        1 g 200 mL/hr over 30 Minutes  Intravenous Every 24 hours 10/15/22 2335 10/16/22 1307   10/15/22 2145  cefTRIAXone (ROCEPHIN) 2 g in sodium chloride  0.9 % 100 mL IVPB        2 g 200 mL/hr over 30 Minutes Intravenous  Once 10/15/22 2134 10/15/22 2223      Culture/Microbiology    Component Value Date/Time   SDES  10/19/2022 4255    BLOOD BLOOD RIGHT HAND Performed at [facility], [place] [address] [place], [place] [address]    SPECREQUEST  10/19/2022 [phone]    IN PEDIATRIC BOTTLE Blood Culture adequate volume Performed at [place] [address], [place], [place] [address]    CULT  10/19/2022 4600    NO GROWTH 5 DAYS Performed at [facility], [place] [address], [place], [place] [address]    REPTSTATUS 10/24/2022 FINAL 10/19/2022 3085  Radiology Studies: No results found.   LOS: 24 days   Antonieta Pert, MD [facility]  11/08/2022, 10:46 AM

## 2022-11-09 DIAGNOSIS — T50902A Poisoning by unspecified drugs, medicaments and biological substances, intentional self-harm, initial encounter: Secondary | ICD-10-CM | POA: Diagnosis not present

## 2022-11-09 NOTE — Progress Notes (Signed)
Physical Therapy Treatment Patient Details Name: David Kim MRN: [id number] DOB: [birth year] Today's Date: 11/09/2022   History of Present Illness [name] is a 77 yo male with PMH paraplegia, severe MDD, persistent adjustment disorder with mixed anxiety and depressed mood ,PUD, gout, HTN, IBS, OSA, prostate cancer, GERD.   He was recently evaluated under IVC last month for expressing suicidal ideation. he was cleared for discharge home on 11/13.  Patient presented to [facility] long ED after home health aide discovered patient took an unknown amount of pills on 10/15/22    PT Comments    Left pt in wheelchair 1.5 hours with safety sitter.  Pt was able to manually perform wheelchair mobility with safety sitter and eat lunch.   Returned to assist pt to recliner using sliding board.  Educated [employer] cabin crew on proper use of sliding board/set up to assist pt back to bed later today.  Instructed to use "two assist one in front and one in back" as pt slides across board.    Recommendations for follow up therapy are one component of a multi-disciplinary discharge planning process, led by the attending physician.  Recommendations may be updated based on patient status, additional functional criteria and insurance authorization.  Follow Up Recommendations  Skilled nursing-short term rehab (<3 hours/day) Can patient physically be transported by private vehicle: No   Assistance Recommended at Discharge Frequent or constant Supervision/Assistance  Patient can return home with the following A lot of help with walking and/or transfers;A lot of help with bathing/dressing/bathroom;Assist for transportation;Help with stairs or ramp for entrance   Equipment Recommendations  None recommended by PT    Recommendations for Other Services       Precautions / Restrictions Precautions Precautions: Fall Precaution Comments: paraplegia T12 , incontinence Restrictions Weight Bearing Restrictions: No Other  Position/Activity Restrictions: L LE spastic/R LE flaccid     Mobility  Assisted with sliding board from wheelchair to drop arm recliner.  Positioned to comfort.    Balance   Cognition Arousal/Alertness: Awake/alert Behavior During Therapy: WFL for tasks assessed/performed Overall Cognitive Status: Within Functional Limits for tasks assessed Area of Impairment: Orientation, Attention, Following commands, Awareness                               General Comments: AxO x 3 improved cognition.  Pleasant.  Following all instructions.  Pt concerned about his future, his home.  Pt expressed concerns about "not getting out of bed every day" and feels like he is "getting weaker".  Pt wants his computer but was told he couldn't have it.  (noy sure how accurate that is)        Exercises      General Comments        Pertinent Vitals/Pain Pain Assessment Pain Assessment: No/denies pain    Home Living                          Prior Function            PT Goals (current goals can now be found in the care plan section) Progress towards PT goals: Progressing toward goals    Frequency    Min 2X/week      PT Plan Current plan remains appropriate    Co-evaluation              AM-PAC PT "6 Clicks" Mobility   Outcome Measure  Help needed turning from your back to your side while in a flat bed without using bedrails?: A Lot Help needed moving from lying on your back to sitting on the side of a flat bed without using bedrails?: A Lot Help needed moving to and from a bed to a chair (including a wheelchair)?: A Lot Help needed standing up from a chair using your arms (e.g., wheelchair or bedside chair)?: Total Help needed to walk in hospital room?: Total Help needed climbing 3-5 steps with a railing? : Total 6 Click Score: 9    End of Session Equipment Utilized During Treatment: Gait belt Activity Tolerance: Patient tolerated treatment well Patient  left: Other (comment) (seated in wheelchair with safety sitter) Nurse Communication: Mobility status PT Visit Diagnosis: Other symptoms and signs involving the nervous system (S82.707)     Time: 8675-4492 PT Time Calculation (min) (ACUTE ONLY): 12 min  Charges:  $Therapeutic Activity: 8-22 mins                     {Janus Vlcek  PTA Acute  Rehabilitation Services Office M-F          [phone] Weekend pager [phone]

## 2022-11-09 NOTE — TOC Progression Note (Addendum)
Transition of Care [facility]) - Progression Note    Patient Details  Name: EVONTE PRESTAGE MRN: [id number] Date of Birth: May 18, 1946  [facility] CM/SW Contact  Telina Kleckley, Juliann Pulse, RN Phone Number: 11/09/2022, 10:01 AM  Clinical Narrative: Still on [facility] wait list rep Simona Huh confirmed. -1p-spoke to [name](sister) about legal aid of [place] for asst w/financial services for pursuing guardianship,also informed of [facility] wait list for IP Psych. Left vm w/[facility] APS guardianship to clarify that APS referral was initiated on 12/29-the rep referred to Ray.      Expected Discharge Plan: Psychiatric Hospital Barriers to Discharge: Continued Medical Work up  Expected Discharge Plan and Services   Discharge Planning Services: CM Consult   Living arrangements for the past 2 months: [place] Determinants of Health (SDOH) Interventions SDOH Screenings   Food Insecurity: No Food Insecurity (10/20/2022)  Housing: Low Risk  (10/20/2022)  Transportation Needs: No Transportation Needs (10/20/2022)  Utilities: Not At Risk (10/20/2022)  Alcohol Screen: Low Risk  (07/24/2022)  Depression (PHQ2-9): Low Risk  (04/05/2022)  Financial Resource Strain: Low Risk  (07/24/2022)  Physical Activity: Inactive (07/24/2022)  Social Connections: Moderately Isolated (07/24/2022)  Stress: No Stress Concern Present (07/24/2022)  Tobacco Use: Low Risk  (10/20/2022)    Readmission Risk Interventions    10/27/2022   10:19 AM  Readmission Risk Prevention Plan  Transportation Screening Complete  PCP or Specialist Appt within 5-7 Days Complete  Home Care Screening Complete  Medication Review (RN CM) Complete

## 2022-11-09 NOTE — Progress Notes (Signed)
Physical Therapy Treatment Patient Details Name: David Kim MRN: [id number] DOB: 1945-12-28 Today's Date: 11/09/2022   History of Present Illness David Kim is a 77 yo male with PMH paraplegia, severe MDD, persistent adjustment disorder with mixed anxiety and depressed mood ,PUD, gout, HTN, IBS, OSA, prostate cancer, GERD.   He was recently evaluated under IVC last month for expressing suicidal ideation. he was cleared for discharge home on 11/13.  Patient presented to [facility] long ED after home health aide discovered patient took an unknown amount of pills on 10/15/22    PT Comments    General Comments: AxO x 3 improved cognition.  Pleasant.  Following all instructions.  Pt concerned about his future, his home.  Pt expressed concerns about "not getting out of bed every day" and feels like he is "getting weaker".  Pt wants his computer but was told he couldn't have it.  (noy sure how accurate that is)  Assisted OOB to wheelchair.  General bed mobility comments: rolling side to side for peri care (incont BB) and applied brief.  Pt with increased ability to perform sidelying to EOB using bed rails and self scooting on bed pad. General transfer comment: Used a long sliding board to transfer from EOB to wheelchair.  Pt able to self asisst with scooting once "set up" was complete.  Pt did lean too far forward with near fall but 2nd person assist was able to prevent fall.  Product manager mobility: Yes Distance: > 500 feet self proplelling Wheelchair Assistance Details (indicate cue type and reason): NO VC's needed.  Pt fully capable to navigate around corners and perfom 180 turns. Left pt in wheelchair with sitter who rolled pt into bathroom to brush teeth.    Recommendations for follow up therapy are one component of a multi-disciplinary discharge planning process, led by the attending physician.  Recommendations may be updated based on patient status, additional functional criteria and  insurance authorization.  Follow Up Recommendations  Skilled nursing-short term rehab (<3 hours/day) Can patient physically be transported by private vehicle: No   Assistance Recommended at Discharge Frequent or constant Supervision/Assistance  Patient can return home with the following A lot of help with walking and/or transfers;A lot of help with bathing/dressing/bathroom;Assist for transportation;Help with stairs or ramp for entrance   Equipment Recommendations  None recommended by PT    Recommendations for Other Services       Precautions / Restrictions Precautions Precautions: Fall Precaution Comments: paraplegia T12 , incontinence Restrictions Weight Bearing Restrictions: No Other Position/Activity Restrictions: L LE spastic/R LE flaccid     Mobility  Bed Mobility Overal bed mobility: Needs Assistance Bed Mobility: Rolling, Sidelying to Sit, Sit to Sidelying Rolling: Min assist Sidelying to sit: Max assist, Mod assist       General bed mobility comments: rolling side to side for peri care (incont BB) and applied brief.  Pt with increased ability to perform sidelying to EOB using bed rails and self scooting on bed pad.    Transfers Overall transfer level: Needs assistance Equipment used: Sliding board Transfers: Bed to chair/wheelchair/BSC             General transfer comment: Used a long sliding board to transfer from EOB to wheelchair.  Pt able to self asisst with scooting once "set up" was complete.  Pt did lean too far forward with near fall but 2nd person assist was able to prevent fall.    Ambulation/Gait  General Gait Details: NON amb.  Pt uses a wheelchair for mobility.   Stairs             Information systems manager mobility: Yes Distance: > 500 feet self proplelling Wheelchair Assistance Details (indicate cue type and reason): NO VC's needed.  Pt fully capable to navigate around corners and  perfom 180 turns.  Modified Rankin (Stroke Patients Only)       Balance                                            Cognition Arousal/Alertness: Awake/alert Behavior During Therapy: WFL for tasks assessed/performed Overall Cognitive Status: Within Functional Limits for tasks assessed Area of Impairment: Orientation, Attention, Following commands, Awareness                               General Comments: AxO x 3 improved cognition.  Pleasant.  Following all instructions.  Pt concerned about his future, his home.  Pt expressed concerns about "not getting out of bed every day" and feels like he is "getting weaker".  Pt wants his computer but was told he couldn't have it.  (noy sure how accurate that is)        Exercises      General Comments        Pertinent Vitals/Pain Pain Assessment Pain Assessment: No/denies pain    Home Living                          Prior Function            PT Goals (current goals can now be found in the care plan section) Progress towards PT goals: Progressing toward goals    Frequency    Min 2X/week      PT Plan Current plan remains appropriate    Co-evaluation              AM-PAC PT "6 Clicks" Mobility   Outcome Measure  Help needed turning from your back to your side while in a flat bed without using bedrails?: A Lot Help needed moving from lying on your back to sitting on the side of a flat bed without using bedrails?: A Lot Help needed moving to and from a bed to a chair (including a wheelchair)?: A Lot Help needed standing up from a chair using your arms (e.g., wheelchair or bedside chair)?: Total Help needed to walk in hospital room?: Total Help needed climbing 3-5 steps with a railing? : Total 6 Click Score: 9    End of Session Equipment Utilized During Treatment: Gait belt Activity Tolerance: Patient tolerated treatment well Patient left: Other (comment) (seated in  wheelchair with safety sitter) Nurse Communication: Mobility status PT Visit Diagnosis: Other symptoms and signs involving the nervous system (N23.557)     Time: 3220-2542 PT Time Calculation (min) (ACUTE ONLY): 35 min  Charges:  $Therapeutic Activity: 8-22 mins $Wheel Chair Management: 8-22 mins                     {Leetta Hendriks  PTA Acute  [employer] M-F          [phone] Weekend pager [phone]

## 2022-11-09 NOTE — Progress Notes (Signed)
PROGRESS NOTE David Kim  DVV:[id number] DOB: 08-Sep-1946 DOA: 10/15/2022 PCP: Biagio Borg, MD   Brief Narrative/Hospital Course: 29 yom w/ paraplegia, severe MDD, persistent adjustment disorder with mixed anxiety and depressed mood ,PUD, gout, HTN, IBS, OSA, prostate cancer, GERD who was recently evaluated under IVC last month after being depressed and expressing suicidal ideation,was treated by psychiatry on their service and cleared for discharge home with decrease in his Klonopin dose on 11/13 but he presented to [facility] ED after home health aide discovered patient took an unknown amount of pills.   In ED- EMS revealed there were opened bottles of losartan, tizanidine, oxybutynin, baclofen, and clonazepam. Upon initial interview the patient endorsed at least taking clonazepam and tizanidine but would not provide additional information.  Initial evaluation also revealed multiple SIRS criteria with fever of 103.2 and urinalysis suggestive of urinary tract infection prompting initiation of intravenous ceftriaxone.  Patient was initially hospitalized in the stepdown unit for suspected drug overdose with initial concern for sepsis secondary to urinary tract infection.  Urine culture came back positive for pansensitive E. coli several days after hospitalization patient was also identified to have persisting left lower lobe infiltrate on 12/14 thought to be due to concurrent pneumonia.  Antibiotics were therefore broadened to cefepime and vancomycin on 12/14.  Clinical evidence of SIRS/sepsis resolved in the days that followed and course of therapy completed early in the morning on 12/19.  After several days of withholding all of the suspected overdosed agents patient began to exhibit extreme agitation tremulousness and hallucinations concerning for benzodiazepine withdrawal.  Patient was then placed back on scheduled benzodiazepines and baclofen on 12/13.    From 12/13 until 12/19  patient's mentation gradually improved. Degree of agitation and confusion also gradually began to improve.  Extensive encephalopathy workup was undertaken including obtaining CT head, MRI brain and EEG all of which did not identify a contributing cause.  Psychiatry was also consulted on admission however their assessments have been obscured by ongoing encephalopathy felt to be due to both infection and drug overdose.  Due to guarded prognosis palliative care consultation was additionally obtained.   Subjective: Patient seen and examined this morning resting comfortably No new complaints. Overnight patient has been afebrile.   Surveyor, quantity at bedside.   Assessment and Plan: Principal Problem:   Intentional overdose ([place]) Active Problems:   Sepsis ([place])   Toxic metabolic encephalopathy   Bacteremia due to Escherichia coli   GIB (gastrointestinal bleeding)   Metabolic acidosis   Stercoral colitis   Severe recurrent major depression ([facility])   Pressure injury of sacral region, stage 2 (H[facility])   Essential hypertension   Paraplegia (H[facility])   GERD   Hypokalemia   Goals of care, counseling/discussion   Stercoral ulcer of rectum   Abnormal finding on GI tract imaging   Acute blood loss anemia   Chronic constipation   Intentional overdose:found to have open bottles of losartan, tizanidine, oxybutynin, baclofen and clonazepam by EMS.  Quantity is unclear.  Originally, all of patient's home medications were initially held due to the overdose.Patient was then resumed on benzodiazepines and baclofen on 12/13 due to concerns for withdrawal and have been slowly tapered since. Continue sitter one-to-one safety precautions and needs IVC paperwork if if patient attempts to elope.  Psychiatry input appreciated following closely continue ongoing management of delirium, awaiting for inpatient psych> continue current Klonopin twice daily, baclofen, melatonin, Risperdal bedtime   Sepsis E. coli  bacteremia: Initially sepsis due  to E. coli UTI, resolved. Several days later  left lower lobe infiltrate noted on chest x-ray 12/14 as an additional source.cefepime/vancomycin started on 12/19, completed early the morning of 12/19.Repeat blood cultures negative.  Acute hypoxic respite failure resolved due to OD  Toxic metabolic encephalopathy mental status currently improved.He was continued to have confusion and hallucinations. multifactorial.  Multiple contributing causes including underlying infection (resolved), drug overdose (resolved) and subsequent withdrawal (also resolved).EEG obtained on 12/15 without obvious seizure activity although this was substantially obscured by ongoing benzodiazepine use. Neurology evaluated the case, repeat EEG was unremarkable on 12/20.MRI brain unremarkable,Recent TSH, vitamin B12 unremarkable.   Sercoral colitis History of severe constipation GI bleeding episodes per rectum 12/12 followed by additional small episodes morning of 12/13. S/p  2 u PRBC on 12/12.  Seen by GI they susspected etiology is due to stercoral colitis with ulceration.No evidence of GI bleeding since with stable hemoglobin and hematocrit.patient's wife was primarily involved in performing regular rectal stimulation and disimpactions prior to her death. Cont aggressive laxative regimen- MiraLAX senna and Colace.CT imaging suggestive of stercoral colitis.Given his underlying paraplegia, suspect he has neurogenic bowel. Disimpactions as needed   Metabolic acidosis: Resolved.   Severe recurrent major depression:Psychiatry following although patient is still suffering from some element of encephalopathy.  Their input is appreciated..Previous physicians had a long discussion with patient's niece, [name] on 12/12 and again today (12/19).  Patient has become severely depressed since his wife passed away in 09-05-2023.  She was essentially the patient's sole caretaker and he was completely dependent  on her.  Since her passing, he has began "shutting down" GERD continue PPI Hypokalemia repleted Essential hypertension BP stable continue amlodipine Paraplegia frequent turning position changing baclofen supportive care to be continued Pressure injury sacral stage II POA continue dressing change frequent turns Pressure Injury 10/16/22 Sacrum Anterior;Mid Unstageable - Full thickness tissue loss in which the base of the injury is covered by slough (yellow, tan, gray, green or brown) and/or eschar (tan, brown or black) in the wound bed. (Active)  10/16/22 0000  Location: Sacrum  Location Orientation: Anterior;Mid  Staging: Unstageable - Full thickness tissue loss in which the base of the injury is covered by slough (yellow, tan, gray, green or brown) and/or eschar (tan, brown or black) in the wound bed.  Wound Description (Comments):   Present on Admission: Yes     Pressure Injury 10/16/22 Heel Left;Anterior Stage 1 -  Intact skin with non-blanchable redness of a localized area usually over a bony prominence. (Active)  10/16/22 0000  Location: Heel  Location Orientation: Left;Anterior  Staging: Stage 1 -  Intact skin with non-blanchable redness of a localized area usually over a bony prominence.  Wound Description (Comments):   Present on Admission: Yes     Pressure Injury 10/24/22 Thigh Left;Posterior;Proximal Stage 2 -  Partial thickness loss of dermis presenting as a shallow open injury with a red, pink wound bed without slough. (Active)  10/24/22 1940  Location: Thigh  Location Orientation: Left;Posterior;Proximal  Staging: Stage 2 -  Partial thickness loss of dermis presenting as a shallow open injury with a red, pink wound bed without slough.  Wound Description (Comments):   Present on Admission:    Goals of care, counseling/discussion:Goals of care discussion 12/12 with [name] patient's niece along with multiple other family members. No one has power of attorney.  Decision  making for patient's care has been shared between the patient's son-in-law, daughter-in-law and niece. Discussed goals of  care with niece and family on 12/14.  They are well aware of the patient's guarded prognosis.   Patient is currently DNR Palliative care has been consulted and is following along, their assistance is appreciated. Per TOC> they will not pursue APS at this time family encouraged to pursue if they felt necessary  DVT prophylaxis: enoxaparin (LOVENOX) injection 40 mg Start: 10/16/22 1000 Code Status:   Code Status: DNR Family Communication: plan of care discussed with patient at bedside. Patient status is: Inpatient because of awaiting inpatient psych Level of care: Progressive   Dispo: The patient is from: home            Anticipated disposition: inpatient psych in  [place] (last 72 hours)     Mobility Assessment     Row Name 11/09/22 0851 11/08/22 1920 11/07/22 1940 11/07/22 1234 11/07/22 1233   Does patient have an order for bedrest or is patient medically unstable No - Continue assessment No - Continue assessment -- -- --   What is the highest level of mobility based on the progressive mobility assessment? Level 1 (Bedfast) - Unable to balance while sitting on edge of bed Level 1 (Bedfast) - Unable to balance while sitting on edge of bed Level 1 (Bedfast) - Unable to balance while sitting on edge of bed Level 1 (Bedfast) - Unable to balance while sitting on edge of bed Level 1 (Bedfast) - Unable to balance while sitting on edge of bed   Is the above level different from baseline mobility prior to current illness? No - Consider discontinuing PT/OT No - Consider discontinuing PT/OT -- -- --    Row Name 11/07/22 0850           Does patient have an order for bedrest or is patient medically unstable No - Continue assessment       What is the highest level of mobility based on the progressive mobility assessment? Level 1 (Bedfast) - Unable to balance while  sitting on edge of bed       Is the above level different from baseline mobility prior to current illness? No - Consider discontinuing PT/OT                 Objective: Vitals last 24 hrs: Vitals:   11/08/22 0557 11/08/22 1218 11/08/22 1958 11/09/22 0648  BP: 120/74 124/72 121/70 128/73  Pulse: 87 89 88 81  Resp: '18 16 18 18  '$ Temp: 98 F (36.7 C) 98.2 F (36.8 C) 98.7 F (37.1 C) 98.3 F (36.8 C)  TempSrc: Oral Oral Oral   SpO2: 98% 99% 97% 98%  Weight:      Height:       Weight change:   Physical Examination: General exam: AAox3, weak,older appearing HEENT:Oral mucosa moist, Ear/Nose WNL grossly, dentition normal. Respiratory system: bilaterally clearBS,no use of accessory muscle Cardiovascular system: S1 & S2 +, regular rate. Gastrointestinal system: Abdomen soft,NT,ND,BS+ Nervous System:Alert, awake, moving extremities and grossly nonfocal Extremities: LE ankle edema neg, lower extremities warm Skin: No rashes,no icterus. MSK: Normal muscle bulk,tone, power   Medications reviewed:  Scheduled Meds:  amLODipine  10 mg Oral Daily   baclofen  10 mg Oral BID   clonazePAM  0.5 mg Oral BID   docusate sodium  100 mg Oral BID   enoxaparin (LOVENOX) injection  40 mg Subcutaneous Q24H   leptospermum manuka honey  1 Application Topical Daily   melatonin  10 mg Oral QHS   pantoprazole  40 mg Oral  Daily   polyethylene glycol  17 g Oral BID   potassium chloride  20 mEq Oral BID   potassium chloride  40 mEq Oral Once   risperiDONE  0.25 mg Oral QHS   senna-docusate  1 tablet Oral BID  Continuous Infusions:  sodium chloride Stopped (11/02/22 1315)   Diet Order             Diet regular Room service appropriate? Yes; Fluid consistency: Thin  Diet effective now                  Intake/Output Summary (Last 24 hours) at 11/09/2022 1240 Last data filed at 11/09/2022 1133 Gross per 24 hour  Intake 960 ml  Output 1425 ml  Net -465 ml   Net IO Since Admission: -3,893.47  mL [11/09/22 1240]  Wt Readings from Last 3 Encounters:  10/23/22 85.7 kg  07/24/22 86.2 kg  07/18/21 86.2 kg     Unresulted Labs (From admission, onward)     Start     Ordered   11/11/22 0500  CBC  Once-Timed,   R       Question:  Specimen collection method  Answer:  Lab=Lab collect   11/05/22 1007   11/11/22 0254  Basic metabolic panel  Once-Timed,   R       Question:  Specimen collection method  Answer:  Lab=Lab collect   11/05/22 1007   10/22/22 0500  CBC with Differential/Platelet  Tomorrow morning,   R       Question:  Specimen collection method  Answer:  Lab=Lab collect   10/21/22 1821   10/21/22 0500  CBC with Differential/Platelet  Tomorrow morning,   R       Question:  Specimen collection method  Answer:  Lab=Lab collect   10/20/22 2041          Data Reviewed: I have personally reviewed following labs and imaging studies CBC: Recent Labs  Lab 11/04/22 0651  WBC 8.1  HGB 14.4  HCT 44.8  MCV 92.6  PLT 270   Basic Metabolic Panel: Recent Labs  Lab 11/04/22 0651  NA 139  K 3.6  CL 107  CO2 27  GLUCOSE 119*  BUN 33*  CREATININE 0.61  CALCIUM 8.6*   No results found for this or any previous visit (from the past 240 hour(s)).  Antimicrobials: Anti-infectives (From admission, onward)    Start     Dose/Rate Route Frequency Ordered Stop   10/20/22 0800  vancomycin (VANCOREADY) IVPB 750 mg/150 mL        750 mg 150 mL/hr over 60 Minutes Intravenous Every 12 hours 10/19/22 1936 10/24/22 2200   10/19/22 1800  vancomycin (VANCOREADY) IVPB 1500 mg/300 mL        1,500 mg 150 mL/hr over 120 Minutes Intravenous  Once 10/19/22 1653 10/19/22 2047   10/19/22 1800  ceFEPIme (MAXIPIME) 2 g in sodium chloride 0.9 % 100 mL IVPB        2 g 200 mL/hr over 30 Minutes Intravenous Every 8 hours 10/19/22 1653 10/24/22 0339   10/17/22 1815  metroNIDAZOLE (FLAGYL) IVPB 500 mg  Status:  Discontinued        500 mg 100 mL/hr over 60 Minutes Intravenous 2 times daily 10/17/22  1758 10/18/22 1135   10/16/22 2200  cefTRIAXone (ROCEPHIN) 2 g in sodium chloride 0.9 % 100 mL IVPB  Status:  Discontinued        2 g 200 mL/hr over 30 Minutes Intravenous Every  24 hours 10/16/22 1307 10/19/22 1636   10/16/22 2100  cefTRIAXone (ROCEPHIN) 1 g in sodium chloride 0.9 % 100 mL IVPB  Status:  Discontinued        1 g 200 mL/hr over 30 Minutes Intravenous Every 24 hours 10/15/22 2335 10/16/22 1307   10/15/22 2145  cefTRIAXone (ROCEPHIN) 2 g in sodium chloride 0.9 % 100 mL IVPB        2 g 200 mL/hr over 30 Minutes Intravenous  Once 10/15/22 2134 10/15/22 2223      Culture/Microbiology    Component Value Date/Time   SDES  10/19/2022 0354    BLOOD BLOOD RIGHT HAND Performed at [facility], [place] [address], [place], [place] [address]    SPECREQUEST  10/19/2022 [phone]    IN PEDIATRIC BOTTLE Blood Culture adequate volume Performed at [facility] [address] [place], [place] [address]    CULT  10/19/2022 4944    NO GROWTH 5 DAYS Performed at [facility], [place] [address] [place], [place] [address]    REPTSTATUS 10/24/2022 FINAL 10/19/2022 1638  Radiology Studies: No results found.   LOS: 25 days   Antonieta Pert, MD [facility]  11/09/2022, 12:40 PM

## 2022-11-10 DIAGNOSIS — T50902A Poisoning by unspecified drugs, medicaments and biological substances, intentional self-harm, initial encounter: Secondary | ICD-10-CM | POA: Diagnosis not present

## 2022-11-10 DIAGNOSIS — T50902S Poisoning by unspecified drugs, medicaments and biological substances, intentional self-harm, sequela: Secondary | ICD-10-CM | POA: Diagnosis not present

## 2022-11-10 NOTE — Consult Note (Addendum)
[facility] Face-to-Face Psychiatry Consult   Reason for Consult:  suicidal attempt Referring Physician:  Dr. Cyd Silence Patient Identification: David Kim MRN:  [id number] Principal Diagnosis: Intentional overdose [facility]) Diagnosis:  Principal Problem:   Intentional overdose ([place]) Active Problems:   Severe recurrent major depression ([place])   Paraplegia ([place])   Essential hypertension   GERD   Toxic metabolic encephalopathy   Goals of care, counseling/discussion   Sepsis ([place])   Metabolic acidosis   Bacteremia due to Escherichia coli   Pressure injury of sacral region, stage 2 ([place])   Stercoral colitis   GIB (gastrointestinal bleeding)   Hypokalemia   Stercoral ulcer of rectum   Abnormal finding on GI tract imaging   Acute blood loss anemia   Chronic constipation   Total Time spent with patient: 45 minutes  Subjective:   David Kim is a 77 y.o. male patient admitted after suicide attempt on multiple sedative/hypnotic type medications.   HPI: Patient seen in his room, watching TV. Sitter is at the bedside. Patient is alert, oriented in x 4.  He describes his mood as "pretty good".  He is sitting upright in his wheelchair, very jovial and laughing a lot. He reports he is sleeping well " I am not sure how but I am sleeping better at night." His family was able to bring his CPAP, although it does not appear that he has used it yet.  He denies any depression, anxiety, or trauma symptoms at this time. He continues to deny suicidal ideation, and or any plans to attempt suicide in the near future.  He states "we are getting near the end of time. I don't have to do anything anymore." He remains under suicide precautions due to extent of suicide attempt.  He denies any delusions and or hallucinations during my reassessment today. Patient is able to contract for safety at a voluntary facility and no longer meets inpatient psychiatric criteria at this time, and will begin to transition to long term  care.  He is now medically stable for transfer to long-term care facility.  He does provide consent to contact [facility] regarding placement, and while he initially expressed interest to go home. He shows some insight by stating " I guess that's not an option. "  We did discuss his access to increased care, ability to save fines, and be more able to engage with nursing staff as benefits of the facility care.  Patient states he pays "$5000 a month in "for home health aid.  He is agreeing to now consider long-term care facility to meet all of his needs.   Did update [name], SW about change in recommendation from inpatient psychiatry to long-term care facility.  As per family, patient has been very depressed for quite some time prior to his wife passing away and they remain concerned that he will attempt suicide if he goes home to live alone again.  Family remains on board for safe disposition and a long-term care facility.  [name] does understand patient no longer meets criteria for inpatient psychiatric hospitalization, and now can begin search for long-term care facility.  In the event patient attempts to leave hospital without safe disposition in place, will need to be placed under involuntary commitment, as he remains high risk for suicide completion based off of several factors to include age, Caucasian male, recent loss, chronic medical complexity, chronic pain, history of suicide attempt, dysthymia, isolation and loneliness.    Past Medical History:  Past Medical History:  Diagnosis Date   Allergic rhinitis    ALLERGIC RHINITIS 10/01/2007   Qualifier: Diagnosis of  By: Jenny Reichmann MD, Hunt Oris    Anxiety    Carpal tunnel syndrome    right   Depression    Depression    Erectile dysfunction    Fatigue    GERD (gastroesophageal reflux disease)    Gout    Hypertension    HYPOGONADISM 03/31/2008   Qualifier: Diagnosis of  By: Jenny Reichmann MD, Hunt Oris    IBS (irritable bowel syndrome)    Low back pain    OSA  (obstructive sleep apnea)    Paraplegia ([place]) 04/23/2009   Qualifier: Diagnosis of  By: Jenny Reichmann MD, Hunt Oris    Peptic ulcer disease    PEPTIC ULCER DISEASE 10/01/2007   Qualifier: Diagnosis of  By: Jenny Reichmann MD, Hunt Oris    Prostate cancer [facility])    PROSTATE CANCER, HX OF 05/23/2007   Qualifier: Diagnosis of  By: Jenny Reichmann MD, [place] PARALYSIS 04/23/2009   Qualifier: Diagnosis of  By: Jenny Reichmann MD, Hunt Oris    Thoracic spinal cord injury ([place]) 03/12/2012    Past Surgical History:  Procedure Laterality Date   ARTERY AND TENDON REPAIR Left 09/06/2018   Procedure: ARTERY AND TENDON REPAIR;  Surgeon: Charlotte Crumb, MD;  Location: [place];  Service: Orthopedics;  Laterality: Left;   CAST APPLICATION Left 43/01/2950   Procedure: CAST APPLICATION;  Surgeon: Charlotte Crumb, MD;  Location: [place];  Service: Orthopedics;  Laterality: Left;   inguinal heniorrhaphy     left leg surgery after fibula     NERVE REPAIR Left 09/06/2018   Procedure: NERVE REPAIR TIMES TWO;  Surgeon: Charlotte Crumb, MD;  Location: [place];  Service: Orthopedics;  Laterality: Left;   OPEN REDUCTION INTERNAL FIXATION (ORIF) DISTAL PHALANX Left 09/06/2018   Procedure: OPEN REDUCTION INTERNAL FIXATION (ORIF) LEFT LONG FINGER;  Surgeon: Charlotte Crumb, MD;  Location: [place];  Service: Orthopedics;  Laterality: Left;   PILONIDAL CYST / SINUS EXCISION     PROSTATECTOMY     tonsillectomy     WOUND EXPLORATION Left 09/06/2018   Procedure: EXPLORATION OFCOMPLEX INJURY;  Surgeon: Charlotte Crumb, MD;  Location: [place];  Service: Orthopedics;  Laterality: Left;   Family History:  Family History  Problem Relation Age of Onset   High blood pressure Mother    Dementia Mother    Diabetes Father     Social History:  Social History   Substance and Sexual Activity  Alcohol Use No     Social History   Substance and Sexual Activity  Drug Use No    Social History   Socioeconomic History   Marital status: Married    Spouse name:  [name]   Number of children: 0   Years of education: 12   Highest education level: Not on file  Occupational History   Occupation: retired    Fish farm manager: RETIRED  Tobacco Use   Smoking status: Never   Smokeless tobacco: Never  Vaping Use   Vaping Use: Never used  Substance and Sexual Activity   Alcohol use: No   Drug use: No   Sexual activity: Not on file  Other Topics Concern   Not on file  Social History Narrative   Patient lives at home with [name]). Patient is disabled. Patient has 12 th grade education.   Right handed.   Caffeine- None   Social Determinants of Radio broadcast assistant  Strain: Low Risk  (07/24/2022)   Overall Financial Resource Strain (CARDIA)    Difficulty of Paying Living Expenses: Not hard at all  Food Insecurity: No Food Insecurity (10/20/2022)   Hunger Vital Sign    Worried About Running Out of Food in the Last Year: Never true    Ran Out of Food in the Last Year: Never true  Transportation Needs: No Transportation Needs (10/20/2022)   PRAPARE - Hydrologist (Medical): No    Lack of Transportation (Non-Medical): No  Physical Activity: Inactive (07/24/2022)   Exercise Vital Sign    Days of Exercise per Week: 0 days    Minutes of Exercise per Session: 0 min  Stress: No Stress Concern Present (07/24/2022)   [place]    Feeling of Stress : Not at all  Social Connections: Moderately Isolated (07/24/2022)   Social Connection and Isolation Panel [NHANES]    Frequency of Communication with Friends and Family: More than three times a week    Frequency of Social Gatherings with Friends and Family: More than three times a week    Attends Religious Services: Never    Marine scientist or Organizations: No    Attends Archivist Meetings: Never    Marital Status: Married   Additional Social History:    Allergies:   Allergies   Allergen Reactions   Amoxicillin Other (See Comments)    Unknown    Endal Hd Other (See Comments)    Unknown     Labs:  No results found for this or any previous visit (from the past 48 hour(s)).   Current Facility-Administered Medications  Medication Dose Route Frequency Provider Last Rate Last Admin   0.9 %  sodium chloride infusion   Intravenous PRN Shalhoub, Sherryll Burger, MD   Paused at 11/02/22 1315   acetaminophen (TYLENOL) tablet 650 mg  650 mg Oral Q6H PRN Dwyane Dee, MD   650 mg at 10/17/22 5102   Or   acetaminophen (TYLENOL) suppository 650 mg  650 mg Rectal Q6H PRN Dwyane Dee, MD   650 mg at 10/17/22 1526   amLODipine (NORVASC) tablet 10 mg  10 mg Oral Daily Shalhoub, Sherryll Burger, MD   10 mg at 11/10/22 1058   baclofen (LIORESAL) tablet 10 mg  10 mg Oral BID Vernelle Emerald, MD   10 mg at 11/10/22 1059   clonazePAM (KLONOPIN) tablet 0.5 mg  0.5 mg Oral BID Vernelle Emerald, MD   0.5 mg at 11/10/22 1058   docusate sodium (COLACE) capsule 100 mg  100 mg Oral BID Mansouraty, Telford Nab., MD   100 mg at 11/10/22 1058   enoxaparin (LOVENOX) injection 40 mg  40 mg Subcutaneous Q24H Dwyane Dee, MD   40 mg at 11/10/22 1100   haloperidol (HALDOL) tablet 2 mg  2 mg Oral BID PRN Cinderella, Margaret A       And   LORazepam (ATIVAN) tablet 1 mg  1 mg Oral BID PRN Cinderella, Margaret A       hydrALAZINE (APRESOLINE) injection 10 mg  10 mg Intravenous Q6H PRN Shalhoub, Sherryll Burger, MD       leptospermum manuka honey (MEDIHONEY) paste 1 Application  1 Application Topical Daily Dwyane Dee, MD   1 Application at 58/52/77 8242   melatonin tablet 10 mg  10 mg Oral QHS Vernelle Emerald, MD   10 mg at 11/09/22 2222  Oral care mouth rinse  15 mL Mouth Rinse PRN Shalhoub, Sherryll Burger, MD       pantoprazole (PROTONIX) EC tablet 40 mg  40 mg Oral Daily Shalhoub, Sherryll Burger, MD   40 mg at 11/10/22 1059   polyethylene glycol (MIRALAX / GLYCOLAX) packet 17 g  17 g Oral BID [place], Amy S,  PA-C   17 g at 11/10/22 1059   polyvinyl alcohol (LIQUIFILM TEARS) 1.4 % ophthalmic solution 1 drop  1 drop Both Eyes PRN Dwyane Dee, MD   1 drop at 10/17/22 2229   potassium chloride SA (KLOR-CON M) CR tablet 20 mEq  20 mEq Oral BID Dibia, Manfred Shirts, MD   20 mEq at 11/10/22 1059   potassium chloride SA (KLOR-CON M) CR tablet 40 mEq  40 mEq Oral Once [place], Marge Duncans, MD       risperiDONE (RISPERDAL) tablet 0.25 mg  0.25 mg Oral QHS Suella Broad, FNP   0.25 mg at 11/09/22 2241   senna-docusate (Senokot-S) tablet 1 tablet  1 tablet Oral BID Dwyane Dee, MD   1 tablet at 11/10/22 1058   tiZANidine (ZANAFLEX) tablet 4 mg  4 mg Oral Q6H PRN Vernelle Emerald, MD   4 mg at 10/25/22 0845    Musculoskeletal: Strength & Muscle Tone: decreased Gait & Station: unable to stand Patient leans: N/A   Psychiatric Specialty Exam:   Appearance:  CM, wearing hospital clothes, with ok grooming and hygiene. Appropriate level of alertness .  Attitude/Behavior: engaging with no eye contact.  Motor: WNL; dyskinesias not evident.   Speech: clear, coherent  Mood: appears euthymic  Affect: blunted  Thought process: patient appears still disorganized and illogical, associations are not appropriate.  Thought content: patient appears delusional, he denies suicidal thoughts, denies homicidal thoughts.  Thought perception: patient possible experiences visual hallucinations.   Cognition: patient is alert and partially-oriented in self, place, month, not in situation; with impaired attention and concentration.  Insight: poor  Judgement: poor   Physical Exam: Physical Exam Vitals and nursing note reviewed.  Constitutional:      Appearance: Normal appearance. He is normal weight.  Skin:    Capillary Refill: Capillary refill takes less than 2 seconds.  Neurological:     General: No focal deficit present.     Mental Status: He is alert and oriented to person, place, and time. Mental  status is at baseline.  Psychiatric:        Mood and Affect: Mood normal.        Behavior: Behavior normal.        Thought Content: Thought content normal.        Judgment: Judgment normal.    Review of Systems  Psychiatric/Behavioral:  Positive for depression. Negative for hallucinations, memory loss, substance abuse and suicidal ideas (denies). The patient is nervous/anxious. The patient does not have insomnia.   All other systems reviewed and are negative.  Blood pressure 112/78, pulse (!) 109, temperature 99.1 F (37.3 C), temperature source Oral, resp. rate 18, height '5\' 8"'$  (1.727 m), weight 85.7 kg, SpO2 97 %. Body mass index is 28.73 kg/m.  Treatment Plan Summary: Daily contact with patient to assess and evaluate symptoms and progress in treatment and Medication management  Assessment and Plan: Patient is a 77 y.o. male patient admitted after suicide attempt on multiple sedative/hypnotic type medications.   Patient continues to show much improvement in progression.  Continue delirium precautions: Minimize use of benzodiazepines, anticholinergics, and opiates, to  the extent possible, as these can worsen mentation. Utilize non-pharmacological interventions including frequent reorientation, maintaining day/night distinction (blinds closed during night, open during day), minimizing excessive stimulation, staff continuity, reorient the patient frequently, provide easily visible clock and calendar, provide sensory aids like glasses, hearing aids, encourage ambulation, regular activities and visitors to maintain cognitive stimulation.  Continue Risperdal 0.25 mg nightly Continue Klonopin 0.5 mg p.o. twice daily as needed See chart for agitation protocol with Haldol and Ativan.   Disposition: Patient no longer meets inpatient psychiatric criteria for acute hospital stabilization.  Patient has essentially been stabilized during this 4-week length of stay as evidenced by response to  medication, improvement in mood, interaction with staff, and now denial of suicidal ideations.  Patient will need to remain in the hospital until safe disposition is obtained.  Psychiatric consult service will continue to follow weekly to assist with search and any other needs from the medical team at this time.  Patient's family is interested in becoming healthcare power of attorney.  Family states they will contact legal aid of [place] for further assistance regarding guardianship.   Suella Broad, FNP 11/10/2022 1:16 PM   I have reviewed the note by NP Starkes-Perry, and discussed the plan of care.  I have addended the note.  I am in agreement with the assessment and plan. Patient no longer meets criteria for inpatient psychiatric admission.  She has been hospitalized until the disposition and long-term care facility can be achieved.  Lavella Hammock, MD        Lavella Hammock, MD 11/10/2022 4:35 PM

## 2022-11-10 NOTE — Progress Notes (Signed)
Chaplain assisted David Kim with filling out advance directives assigning his brother-in-law (deceased wife's brother) and sister-in-law (his wife) as his 48.  Chaplain provided education on the significance of the document. We were unable to notarize it, but will return on Tuesday to assist with this part.  Chaplain Janne Napoleon, [place] PAger, [phone]

## 2022-11-10 NOTE — Progress Notes (Signed)
Physical Therapy Treatment Patient Details Name: David Kim MRN: [id number] DOB: [birth year] Today's Date: 11/10/2022   History of Present Illness David Kim is a 77 yo male with PMH paraplegia, severe MDD, persistent adjustment disorder with mixed anxiety and depressed mood ,PUD, gout, HTN, IBS, OSA, prostate cancer, GERD.   He was recently evaluated under IVC last month for expressing suicidal ideation. he was cleared for discharge home on 11/13.  Patient presented to [facility] long ED after home health aide discovered patient took an unknown amount of pills on 10/15/22    PT Comments    Per chart review, Per Pych "Pt no longer requires Inpt Pych facility" and has agreed to SNF.  Pt would benefit from ST Rehab at SNF to regain his prior level of bed mobility and sliding board transfers.  With extended length of stay, he presents with weakness/debility and a loss in his self ability to perform the tasks he was able prior to admit.     Recommendations for follow up therapy are one component of a multi-disciplinary discharge planning process, led by the attending physician.  Recommendations may be updated based on patient status, additional functional criteria and insurance authorization.  Follow Up Recommendations  Skilled nursing-short term rehab (<3 hours/day) Can patient physically be transported by private vehicle: No   Assistance Recommended at Discharge Frequent or constant Supervision/Assistance  Patient can return home with the following A lot of help with walking and/or transfers;A lot of help with bathing/dressing/bathroom;Assist for transportation;Help with stairs or ramp for entrance   Equipment Recommendations  None recommended by PT    Recommendations for Other Services       Precautions / Restrictions Precautions Precautions: Fall Precaution Comments: paraplegia T12 , incontinence Restrictions Weight Bearing Restrictions: No Other Position/Activity Restrictions: L LE  spastic/R LE flaccid     Mobility  Bed Mobility Overal bed mobility: Needs Assistance Bed Mobility: Supine to Sit     Supine to sit: Mod assist, Max assist     General bed mobility comments: OOB in recliner    Transfers Overall transfer level: Needs assistance Equipment used: Sliding board Transfers: Bed to chair/wheelchair/BSC             General transfer comment: Used a long sliding board to transfer from wheelchair to drop arm recliner  Pt able to self asisst with scooting once "set up" was complete.    Ambulation/Gait               General Gait Details: NON amb.  Pt uses a wheelchair for mobility.   Stairs             Information systems manager mobility: Yes Distance: Cabin crew Details (indicate cue type and reason): NO VC's needed.  Pt fully capable to navigate around corners and perfom 180 turns.  Modified Rankin (Stroke Patients Only)       Balance                                            Cognition Arousal/Alertness: Awake/alert Behavior During Therapy: WFL for tasks assessed/performed                                   General Comments: AxO x 3 allways willing to get OOB.  Brother  in [place] brought pt's person wheelchair along with his short sliding board.        Exercises      General Comments        Pertinent Vitals/Pain Pain Assessment Pain Assessment: No/denies pain    Home Living                          Prior Function            PT Goals (current goals can now be found in the care plan section) Progress towards PT goals: Progressing toward goals    Frequency    Min 2X/week      PT Plan Current plan remains appropriate    Co-evaluation              AM-PAC PT "6 Clicks" Mobility   Outcome Measure  Help needed turning from your back to your side while in a flat bed without using bedrails?: A Lot Help  needed moving from lying on your back to sitting on the side of a flat bed without using bedrails?: A Lot Help needed moving to and from a bed to a chair (including a wheelchair)?: A Lot Help needed standing up from a chair using your arms (e.g., wheelchair or bedside chair)?: Total Help needed to walk in hospital room?: Total Help needed climbing 3-5 steps with a railing? : Total 6 Click Score: 9    End of Session Equipment Utilized During Treatment: Gait belt Activity Tolerance: Patient tolerated treatment well Patient left: in chair;with call bell/phone within reach;with nursing/sitter in room Nurse Communication: Mobility status PT Visit Diagnosis: Other symptoms and signs involving the nervous system (R29.898)     Time: 1420-1436 PT Time Calculation (min) (ACUTE ONLY): 16 min  Charges:  $Therapeutic Activity: 8-22 mins                     David Kim  PTA [place] [facility] M-F          [phone] Weekend pager [phone]

## 2022-11-10 NOTE — Progress Notes (Signed)
PROGRESS NOTE David Kim  UTM:[id number] DOB: 10-23-1946 DOA: 10/15/2022 PCP: Biagio Borg, MD   Brief Narrative/Hospital Course: 71 yom w/ paraplegia, severe MDD, persistent adjustment disorder with mixed anxiety and depressed mood ,PUD, gout, HTN, IBS, OSA, prostate cancer, GERD who was recently evaluated under IVC last month after being depressed and expressing suicidal ideation,was treated by psychiatry on their service and cleared for discharge home with decrease in his Klonopin dose on 11/13 but he presented to [facility] ED after home health aide discovered patient took an unknown amount of pills.   In ED- EMS revealed there were opened bottles of losartan, tizanidine, oxybutynin, baclofen, and clonazepam. Upon initial interview the patient endorsed at least taking clonazepam and tizanidine but would not provide additional information.  Initial evaluation also revealed multiple SIRS criteria with fever of 103.2 and urinalysis suggestive of urinary tract infection prompting initiation of intravenous ceftriaxone.  Patient was initially hospitalized in the stepdown unit for suspected drug overdose with initial concern for sepsis secondary to urinary tract infection.  Urine culture came back positive for pansensitive E. coli several days after hospitalization patient was also identified to have persisting left lower lobe infiltrate on 12/14 thought to be due to concurrent pneumonia.  Antibiotics were therefore broadened to cefepime and vancomycin on 12/14.  Clinical evidence of SIRS/sepsis resolved in the days that followed and course of therapy completed early in the morning on 12/19.  After several days of withholding all of the suspected overdosed agents patient began to exhibit extreme agitation tremulousness and hallucinations concerning for benzodiazepine withdrawal.  Patient was then placed back on scheduled benzodiazepines and baclofen on 12/13.    From 12/13 until 12/19  patient's mentation gradually improved. Degree of agitation and confusion also gradually began to improve.  Extensive encephalopathy workup was undertaken including obtaining CT head, MRI brain and EEG all of which did not identify a contributing cause.  Psychiatry was also consulted on admission however their assessments have been obscured by ongoing encephalopathy felt to be due to both infection and drug overdose.  Due to guarded prognosis palliative care consultation was additionally obtained.   Subjective:  Seen and examined seated at the bedside  No complaints eating his meal well including fruits Sitter at the bedside  Assessment and Plan: Principal Problem:   Intentional overdose ([place]) Active Problems:   Sepsis ([place])   Toxic metabolic encephalopathy   Bacteremia due to Escherichia coli   GIB (gastrointestinal bleeding)   Metabolic acidosis   Stercoral colitis   Severe recurrent major depression (HCC)   Pressure injury of sacral region, stage 2 (HCC)   Essential hypertension   Paraplegia (HCC)   GERD   Hypokalemia   Goals of care, counseling/discussion   Stercoral ulcer of rectum   Abnormal finding on GI tract imaging   Acute blood loss anemia   Chronic constipation   Intentional overdose:found to have open bottles of losartan, tizanidine, oxybutynin, baclofen and clonazepam by EMS.  Quantity is unclear.  Originally, all of patient's home medications were initially held due to the overdose.Patient was then resumed on benzodiazepines and baclofen on 12/13 due to concerns for withdrawal and have been slowly tapered since. Continue sitter one-to-one safety precautions and needs IVC paperwork if if patient attempts to elope.  Psychiatry input appreciated following closely continue ongoing management of delirium, awaiting for inpatient psych> continue current Klonopin twice daily, baclofen, melatonin, Risperdal bedtime   Sepsis E. coli bacteremia: Initially sepsis due to E.  coli UTI, resolved. Several days later  left lower lobe infiltrate noted on chest x-ray 12/14 as an additional source.cefepime/vancomycin started on 12/19, completed early the morning of 12/19.Repeat blood cultures negative.  Acute hypoxic respite failure resolved due to OD  Toxic metabolic encephalopathy mental status currently improved.He was continued to have confusion and hallucinations. multifactorial.  Multiple contributing causes including underlying infection (resolved), drug overdose (resolved) and subsequent withdrawal (also resolved).EEG obtained on 12/15 without obvious seizure activity although this was substantially obscured by ongoing benzodiazepine use. Neurology evaluated the case, repeat EEG was unremarkable on 12/20.MRI brain unremarkable,Recent TSH, vitamin B12 unremarkable.   Sercoral colitis History of severe constipation GI bleeding episodes per rectum 12/12 followed by additional small episodes morning of 12/13. S/p  2 u PRBC on 12/12.  Seen by GI they susspected etiology is due to stercoral colitis with ulceration.No evidence of GI bleeding since with stable hemoglobin and hematocrit.patient's wife was primarily involved in performing regular rectal stimulation and disimpactions prior to her death. Cont aggressive laxative regimen- MiraLAX senna and Colace.CT imaging suggestive of stercoral colitis.Given his underlying paraplegia, suspect he has neurogenic bowel. Disimpactions as needed   Metabolic acidosis: Resolved.   Severe recurrent major depression:Psychiatry following although patient is still suffering from some element of encephalopathy.  Their input is appreciated..Previous physicians had a long discussion with patient's niece, [name] on 12/12 and again today (12/19).  Patient has become severely depressed since his wife passed away in 08/20/23.  She was essentially the patient's sole caretaker and he was completely dependent on her.  Since her passing, he has began  "shutting down" GERD continue PPI Hypokalemia repleted Essential hypertension BP stable continue amlodipine Paraplegia frequent turning position changing baclofen supportive care to be continued Pressure injury sacral stage II POA continue dressing change frequent turns Pressure Injury 10/16/22 Sacrum Anterior;Mid Unstageable - Full thickness tissue loss in which the base of the injury is covered by slough (yellow, tan, gray, green or brown) and/or eschar (tan, brown or black) in the wound bed. (Active)  10/16/22 0000  Location: Sacrum  Location Orientation: Anterior;Mid  Staging: Unstageable - Full thickness tissue loss in which the base of the injury is covered by slough (yellow, tan, gray, green or brown) and/or eschar (tan, brown or black) in the wound bed.  Wound Description (Comments):   Present on Admission: Yes     Pressure Injury 10/16/22 Heel Left;Anterior Stage 1 -  Intact skin with non-blanchable redness of a localized area usually over a bony prominence. (Active)  10/16/22 0000  Location: Heel  Location Orientation: Left;Anterior  Staging: Stage 1 -  Intact skin with non-blanchable redness of a localized area usually over a bony prominence.  Wound Description (Comments):   Present on Admission: Yes     Pressure Injury 10/24/22 Thigh Left;Posterior;Proximal Stage 2 -  Partial thickness loss of dermis presenting as a shallow open injury with a red, pink wound bed without slough. (Active)  10/24/22 1940  Location: Thigh  Location Orientation: Left;Posterior;Proximal  Staging: Stage 2 -  Partial thickness loss of dermis presenting as a shallow open injury with a red, pink wound bed without slough.  Wound Description (Comments):   Present on Admission:    Goals of care, counseling/discussion:Goals of care discussion 12/12 with [name] patient's niece along with multiple other family members. No one has power of attorney.  Decision making for patient's care has been shared  between the patient's son-in-law, daughter-in-law and niece. Discussed goals of care with  niece and family on 12/14.  They are well aware of the patient's guarded prognosis.   Patient is currently DNR Palliative care has been consulted and is following along, their assistance is appreciated. Per TOC> they will not pursue APS at this time family encouraged to pursue if they felt necessary  DVT prophylaxis: enoxaparin (LOVENOX) injection 40 mg Start: 10/16/22 1000 Code Status:   Code Status: DNR Family Communication: plan of care discussed with patient at bedside. Patient status is: Inpatient because of awaiting inpatient psych Level of care: Progressive   Dispo: The patient is from: home            Anticipated disposition: inpatient psych once bed available.Patient is medically stable   Mobility Assessment (last 72 hours)     Mobility Assessment     Row Name 11/10/22 0804 11/09/22 2000 11/09/22 1332 11/09/22 0851 11/08/22 1920   Does patient have an order for bedrest or is patient medically unstable No - Continue assessment -- -- No - Continue assessment No - Continue assessment   What is the highest level of mobility based on the progressive mobility assessment? Level 1 (Bedfast) - Unable to balance while sitting on edge of bed Level 1 (Bedfast) - Unable to balance while sitting on edge of bed Level 1 (Bedfast) - Unable to balance while sitting on edge of bed Level 1 (Bedfast) - Unable to balance while sitting on edge of bed Level 1 (Bedfast) - Unable to balance while sitting on edge of bed   Is the above level different from baseline mobility prior to current illness? -- -- -- No - Consider discontinuing PT/OT No - Consider discontinuing PT/OT    Row Name 11/07/22 1940 11/07/22 1234 11/07/22 1233       What is the highest level of mobility based on the progressive mobility assessment? Level 1 (Bedfast) - Unable to balance while sitting on edge of bed Level 1 (Bedfast) - Unable to balance  while sitting on edge of bed Level 1 (Bedfast) - Unable to balance while sitting on edge of bed               Objective: Vitals last 24 hrs: Vitals:   11/09/22 0648 11/09/22 1332 11/09/22 2045 11/10/22 0623  BP: 128/73 135/80 126/73 123/70  Pulse: 81 (!) 102 96 88  Resp: '18 18 18 18  '$ Temp: 98.3 F (36.8 C) 97.9 F (36.6 C) 99.6 F (37.6 C) 98.1 F (36.7 C)  TempSrc:  Oral Oral Oral  SpO2: 98% 98% 95% 96%  Weight:      Height:       Weight change:   Physical Examination: General exam: AA, weak,older appearing HEENT:Oral mucosa moist, Ear/Nose WNL grossly, dentition normal. Respiratory system: bilaterally clear BS, no use of accessory muscle Cardiovascular system: S1 & S2 +, regular rate. Gastrointestinal system: Abdomen soft, NT,ND,BS+ Nervous System:Alert, awake, moving extremities and grossly nonfocal Extremities: LE ankle edema neg, lower extremities warm Skin: No rashes,no icterus. MSK: Normal muscle bulk,tone, power  Medications reviewed:  Scheduled Meds:  amLODipine  10 mg Oral Daily   baclofen  10 mg Oral BID   clonazePAM  0.5 mg Oral BID   docusate sodium  100 mg Oral BID   enoxaparin (LOVENOX) injection  40 mg Subcutaneous Q24H   leptospermum manuka honey  1 Application Topical Daily   melatonin  10 mg Oral QHS   pantoprazole  40 mg Oral Daily   polyethylene glycol  17 g Oral BID  potassium chloride  20 mEq Oral BID   potassium chloride  40 mEq Oral Once   risperiDONE  0.25 mg Oral QHS   senna-docusate  1 tablet Oral BID  Continuous Infusions:  sodium chloride Stopped (11/02/22 1315)   Diet Order             Diet regular Room service appropriate? Yes; Fluid consistency: Thin  Diet effective now                  Intake/Output Summary (Last 24 hours) at 11/10/2022 1044 Last data filed at 11/10/2022 0809 Gross per 24 hour  Intake 1540 ml  Output 550 ml  Net 990 ml    Net IO Since Admission: -3,143.47 mL [11/10/22 1044]  Wt Readings from  Last 3 Encounters:  10/23/22 85.7 kg  07/24/22 86.2 kg  07/18/21 86.2 kg     Unresulted Labs (From admission, onward)     Start     Ordered   11/11/22 0500  CBC  Once-Timed,   R       Question:  Specimen collection method  Answer:  Lab=Lab collect   11/05/22 1007   11/11/22 4403  Basic metabolic panel  Once-Timed,   R       Question:  Specimen collection method  Answer:  Lab=Lab collect   11/05/22 1007   10/22/22 0500  CBC with Differential/Platelet  Tomorrow morning,   R       Question:  Specimen collection method  Answer:  Lab=Lab collect   10/21/22 1821   10/21/22 0500  CBC with Differential/Platelet  Tomorrow morning,   R       Question:  Specimen collection method  Answer:  Lab=Lab collect   10/20/22 2041          Data Reviewed: I have personally reviewed following labs and imaging studies CBC: Recent Labs  Lab 11/04/22 0651  WBC 8.1  HGB 14.4  HCT 44.8  MCV 92.6  PLT 474    Basic Metabolic Panel: Recent Labs  Lab 11/04/22 0651  NA 139  K 3.6  CL 107  CO2 27  GLUCOSE 119*  BUN 33*  CREATININE 0.61  CALCIUM 8.6*    No results found for this or any previous visit (from the past 240 hour(s)).  Antimicrobials: Anti-infectives (From admission, onward)    Start     Dose/Rate Route Frequency Ordered Stop   10/20/22 0800  vancomycin (VANCOREADY) IVPB 750 mg/150 mL        750 mg 150 mL/hr over 60 Minutes Intravenous Every 12 hours 10/19/22 1936 10/24/22 2200   10/19/22 1800  vancomycin (VANCOREADY) IVPB 1500 mg/300 mL        1,500 mg 150 mL/hr over 120 Minutes Intravenous  Once 10/19/22 1653 10/19/22 2047   10/19/22 1800  ceFEPIme (MAXIPIME) 2 g in sodium chloride 0.9 % 100 mL IVPB        2 g 200 mL/hr over 30 Minutes Intravenous Every 8 hours 10/19/22 1653 10/24/22 0339   10/17/22 1815  metroNIDAZOLE (FLAGYL) IVPB 500 mg  Status:  Discontinued        500 mg 100 mL/hr over 60 Minutes Intravenous 2 times daily 10/17/22 1758 10/18/22 1135   10/16/22 2200   cefTRIAXone (ROCEPHIN) 2 g in sodium chloride 0.9 % 100 mL IVPB  Status:  Discontinued        2 g 200 mL/hr over 30 Minutes Intravenous Every 24 hours 10/16/22 1307 10/19/22 1636   10/16/22  2100  cefTRIAXone (ROCEPHIN) 1 g in sodium chloride 0.9 % 100 mL IVPB  Status:  Discontinued        1 g 200 mL/hr over 30 Minutes Intravenous Every 24 hours 10/15/22 2335 10/16/22 1307   10/15/22 2145  cefTRIAXone (ROCEPHIN) 2 g in sodium chloride 0.9 % 100 mL IVPB        2 g 200 mL/hr over 30 Minutes Intravenous  Once 10/15/22 2134 10/15/22 2223      Culture/Microbiology    Component Value Date/Time   SDES  10/19/2022 3419    BLOOD BLOOD RIGHT HAND Performed at [facility], [place] [address], [place], [place] [address]    SPECREQUEST  10/19/2022 [phone]    IN PEDIATRIC BOTTLE Blood Culture adequate volume Performed at [facility] [address], [place], [place] [address]    CULT  10/19/2022 9242    NO GROWTH 5 DAYS Performed at [facility], [place] [address] [place], [place] [address]    REPTSTATUS 10/24/2022 FINAL 10/19/2022 9622  Radiology Studies: No results found.   LOS: 26 days   Antonieta Pert, MD [facility]  11/10/2022, 10:44 AM

## 2022-11-10 NOTE — Progress Notes (Signed)
Physical Therapy Treatment Patient Details Name: David Kim MRN: [id number] DOB: Jun 10, 1946 Today's Date: 11/10/2022   History of Present Illness Mr. Burruss is a 77 yo male with PMH paraplegia, severe MDD, persistent adjustment disorder with mixed anxiety and depressed mood ,PUD, gout, HTN, IBS, OSA, prostate cancer, GERD.   He was recently evaluated under IVC last month for expressing suicidal ideation. he was cleared for discharge home on 11/13.  Patient presented to [facility] long ED after home health aide discovered patient took an unknown amount of pills on 10/15/22    PT Comments    General Comments: AxO x 3 allways willing to get OOB.  Brother in [place] brought pt's person wheelchair along with his short sliding board. Assisted with hygiene then OOB to wheelchair using long sliding board.  Then pt self propelled wheelchair > 650 feet.  Mod c/o fatigue after.  "I feel like I have lost a lot of my strength".   Left pt in wheelchair with safety sitter. Asked sitter to assist pt with oral care then have pt take "another lap" around unit in his chair.    Recommendations for follow up therapy are one component of a multi-disciplinary discharge planning process, led by the attending physician.  Recommendations may be updated based on patient status, additional functional criteria and insurance authorization.  Follow Up Recommendations  Skilled nursing-short term rehab (<3 hours/day) Can patient physically be transported by private vehicle: No   Assistance Recommended at Discharge Frequent or constant Supervision/Assistance  Patient can return home with the following A lot of help with walking and/or transfers;A lot of help with bathing/dressing/bathroom;Assist for transportation;Help with stairs or ramp for entrance   Equipment Recommendations  None recommended by PT    Recommendations for Other Services       Precautions / Restrictions Precautions Precautions: Fall Precaution  Comments: paraplegia T12 , incontinence Restrictions Weight Bearing Restrictions: No Other Position/Activity Restrictions: L LE spastic/R LE flaccid     Mobility  Bed Mobility Overal bed mobility: Needs Assistance Bed Mobility: Supine to Sit     Supine to sit: Mod assist, Max assist     General bed mobility comments: rolling side to side for peri care (incont BM) and applied brief.  Pt with increased ability to perform sidelying to EOB using bed rails and self scooting on bed pad.    Transfers Overall transfer level: Needs assistance Equipment used: Sliding board Transfers: Bed to chair/wheelchair/BSC             General transfer comment: Used a long sliding board to transfer from EOB to wheelchair.  Pt able to self asisst with scooting once "set up" was complete.    Ambulation/Gait               General Gait Details: NON amb.  Pt uses a wheelchair for mobility.   Stairs             Information systems manager mobility: Yes Distance: Cabin crew Details (indicate cue type and reason): NO VC's needed.  Pt fully capable to navigate around corners and perfom 180 turns.  Modified Rankin (Stroke Patients Only)       Balance                                            Cognition Arousal/Alertness: Awake/alert Behavior During Therapy:  WFL for tasks assessed/performed                                   General Comments: AxO x 3 allways willing to get OOB.  Brother in [place] brought pt's person wheelchair along with his short sliding board.        Exercises      General Comments        Pertinent Vitals/Pain Pain Assessment Pain Assessment: No/denies pain    Home Living                          Prior Function            PT Goals (current goals can now be found in the care plan section) Progress towards PT goals: Progressing toward goals     Frequency    Min 2X/week      PT Plan Current plan remains appropriate    Co-evaluation              AM-PAC PT "6 Clicks" Mobility   Outcome Measure  Help needed turning from your back to your side while in a flat bed without using bedrails?: A Lot Help needed moving from lying on your back to sitting on the side of a flat bed without using bedrails?: A Lot Help needed moving to and from a bed to a chair (including a wheelchair)?: A Lot Help needed standing up from a chair using your arms (e.g., wheelchair or bedside chair)?: Total Help needed to walk in hospital room?: Total Help needed climbing 3-5 steps with a railing? : Total 6 Click Score: 9    End of Session Equipment Utilized During Treatment: Gait belt Activity Tolerance: Patient tolerated treatment well Patient left: Other (comment) (in wheelchair) Nurse Communication: Mobility status PT Visit Diagnosis: Other symptoms and signs involving the nervous system (A35.573)     Time: 2202-5427 PT Time Calculation (min) (ACUTE ONLY): 28 min  Charges:  $Therapeutic Activity: 8-22 mins $Wheel Chair Management: 8-22 mins                     Rica Koyanagi  PTA Acute  Rehabilitation Services Office M-F          [phone] Weekend pager [phone]

## 2022-11-11 DIAGNOSIS — T1491XA Suicide attempt, initial encounter: Secondary | ICD-10-CM | POA: Diagnosis not present

## 2022-11-11 LAB — CBC
HCT: 44.1 % (ref 39.0–52.0)
Hemoglobin: 14.1 g/dL (ref 13.0–17.0)
MCH: 29.3 pg (ref 26.0–34.0)
MCHC: 32 g/dL (ref 30.0–36.0)
MCV: 91.5 fL (ref 80.0–100.0)
Platelets: 231 10*3/uL (ref 150–400)
RBC: 4.82 MIL/uL (ref 4.22–5.81)
RDW: 14.3 % (ref 11.5–15.5)
WBC: 9.9 10*3/uL (ref 4.0–10.5)
nRBC: 0 % (ref 0.0–0.2)

## 2022-11-11 LAB — BASIC METABOLIC PANEL
Anion gap: 7 (ref 5–15)
BUN: 23 mg/dL (ref 8–23)
CO2: 24 mmol/L (ref 22–32)
Calcium: 8.7 mg/dL — ABNORMAL LOW (ref 8.9–10.3)
Chloride: 106 mmol/L (ref 98–111)
Creatinine, Ser: 0.7 mg/dL (ref 0.61–1.24)
GFR, Estimated: >60 mL/min (ref >60)
Glucose, Bld: 114 mg/dL — ABNORMAL HIGH (ref 70–99)
Potassium: 3.7 mmol/L (ref 3.5–5.1)
Sodium: 137 mmol/L (ref 135–145)

## 2022-11-11 NOTE — Progress Notes (Signed)
PROGRESS NOTE    David Kim  IOX:[id number] DOB: [birth year] DOA: 10/15/2022 PCP: Biagio Borg, MD   Brief Narrative:  84 yom w/ paraplegia, severe MDD, persistent adjustment disorder with mixed anxiety and depressed mood ,PUD, gout, HTN, IBS, OSA, prostate cancer, GERD who was recently evaluated under IVC last month after being depressed and expressing suicidal ideation,was treated by psychiatry on their service and cleared for discharge home with decrease in his Klonopin dose on 11/13 but he presented to [facility] long hospital ED after home health aide discovered patient took an unknown amount of pills.   In ED- EMS revealed there were opened bottles of losartan, tizanidine, oxybutynin, baclofen, and clonazepam. Upon initial interview the patient endorsed at least taking clonazepam and tizanidine but would not provide additional information.  Initial evaluation also revealed multiple SIRS criteria with fever of 103.2 and urinalysis suggestive of urinary tract infection prompting initiation of intravenous ceftriaxone.  Patient was initially hospitalized in the stepdown unit for suspected drug overdose with initial concern for sepsis secondary to urinary tract infection.   Urine culture came back positive for pansensitive E. coli several days after hospitalization patient was also identified to have persisting left lower lobe infiltrate on 12/14 thought to be due to concurrent pneumonia.  Antibiotics were therefore broadened to cefepime and vancomycin on 12/14.  Clinical evidence of SIRS/sepsis resolved in the days that followed and course of therapy completed early in the morning on 12/19.   After several days of withholding all of the suspected overdosed agents patient began to exhibit extreme agitation tremulousness and hallucinations concerning for benzodiazepine withdrawal.  Patient was then placed back on scheduled benzodiazepines and baclofen on 12/13.     From 12/13 until 12/19 patient's  mentation gradually improved. Degree of agitation and confusion also gradually began to improve.  Extensive encephalopathy workup was undertaken including obtaining CT head, MRI brain and EEG all of which did not identify a contributing cause.   Psychiatry was also consulted on admission however their assessments have been obscured by ongoing encephalopathy felt to be due to both infection and drug overdose.   Due to guarded prognosis palliative care consultation was additionally obtained.  Assessment & Plan:   Intentional overdose: -found to have open bottles of losartan, tizanidine, oxybutynin, baclofen and clonazepam by EMS.  Quantity is unclear.  Originally, all of patient's home medications were initially held due to the overdose.Patient was then resumed on benzodiazepines and baclofen on 12/13 due to concerns for withdrawal and have been slowly tapered since. Continue sitter one-to-one safety precautions and needs IVC paperwork if if patient attempts to elope.  Psychiatry input appreciated following closely continue ongoing management of delirium, - continue current Klonopin twice daily, baclofen, melatonin, Risperdal bedtime -Psychiatry changed the recommendation from inpatient psychiatry to long-term care facility.toc   Sepsis E. coli bacteremia: -Initially sepsis due to E. coli UTI, resolved. Several days later  left lower lobe infiltrate noted on chest x-ray 12/14 as an additional source.cefepime/vancomycin started on 12/19, completed early the morning of 12/19. -Repeat blood cultures negative.   Acute hypoxic respite failure resolved due to OD -On room air   Toxic metabolic encephalopathy mental status currently improved. -multifactorial.  - including underlying infection (resolved), drug overdose (resolved) and subsequent withdrawal (also resolved). -EEG obtained on 12/15 without obvious seizure activity although this was substantially obscured by ongoing benzodiazepine  use. -Neurology evaluated the case, repeat EEG was unremarkable on 12/20. -MRI brain unremarkable,Recent TSH, vitamin B12 unremarkable.  Sercoral colitis History of severe constipation GI bleeding  -episodes per rectum 12/12 followed by additional small episodes morning of 12/13. S/p  2 u PRBC on 12/12.  Seen by GI they susspected etiology is due to stercoral colitis with ulceration. -No evidence of GI bleeding since with stable hemoglobin and hematocrit.patient's wife was primarily involved in performing regular rectal stimulation and disimpactions prior to her death.  -Cont aggressive laxative regimen- MiraLAX senna and Colace. -CT imaging suggestive of stercoral colitis.Given his underlying paraplegia, suspect he has neurogenic bowel. Disimpactions as needed   Metabolic acidosis: Resolved.   Severe recurrent major depression:Psychiatry following   Their input is appreciated..Previous physicians had a long discussion with patient's niece, [name] on 12/12 and again (12/19).  Patient has become severely depressed since his wife passed away in Aug 24, 2023.  She was essentially the patient's sole caretaker and he was completely dependent on her.  Since her passing, he has began "shutting down"  GERD continue PPI  Hypokalemia repleted  Essential hypertension BP stable continue amlodipine  Paraplegia frequent turning position changing baclofen supportive care to be continued Pressure injury sacral stage II POA continue dressing change frequent turns  Goals of care, counseling/discussion:Goals of care discussion 12/12 with [name] patient's niece along with multiple other family members. No one has power of attorney.  Decision making for patient's care has been shared between the patient's son-in-law, daughter-in-law and niece. Discussed goals of care with niece and family on 12/14.  They are well aware of the patient's guarded prognosis.   Patient is currently DNR Palliative care has  been consulted and is following along, their assistance is appreciated. Per TOC> they will not pursue APS at this time family encouraged to pursue if they felt necessary  DVT prophylaxis: Lovenox Code Status: DNR Family Communication:sitter present at bedside.  Plan of care discussed with patient in length and he verbalized understanding and agreed with it. Disposition Plan: To be determined  Consultants:  Psychiatry GI Neurology  Procedures:  None   Status is: Inpatient     Subjective: Patient seen and examined.  Sitting comfortably on the bed eating breakfast.  Sitter at the bedside.  Patient denies any new complaints.  Slept well last night.  No acute events overnight.  Objective: Vitals:   11/10/22 1304 11/10/22 2126 11/11/22 0429 11/11/22 1325  BP: 112/78 132/79 127/75 123/69  Pulse: (!) 109 92 85 90  Resp: '18 18 18 20  '$ Temp: 99.1 F (37.3 C) 98.1 F (36.7 C) 98.4 F (36.9 C) 98.7 F (37.1 C)  TempSrc: Oral Oral Oral Oral  SpO2: 97% 96% 97% 96%  Weight:      Height:        Intake/Output Summary (Last 24 hours) at 11/11/2022 1358 Last data filed at 11/11/2022 1200 Gross per 24 hour  Intake 940 ml  Output 1600 ml  Net -660 ml   Filed Weights   10/15/22 2347 10/23/22 0000  Weight: 82.2 kg 85.7 kg    Examination:  General exam: Appears calm and comfortable mere, communicating well Respiratory system: Clear to auscultation. Respiratory effort normal. Cardiovascular system: S1 & S2 heard, RRR. No JVD, murmurs, rubs, gallops or clicks. No pedal edema. Gastrointestinal system: Abdomen is nondistended, soft and nontender. No organomegaly or masses felt. Normal bowel sounds heard. Central nervous system: Alert and oriented. Skin: No rashes, lesions or ulcers Psychiatry: Flat affect Data Reviewed: I have personally reviewed following labs and imaging studies  CBC: Recent Labs  Lab 11/11/22  0531  WBC 9.9  HGB 14.1  HCT 44.1  MCV 91.5  PLT 707   Basic  Metabolic Panel: Recent Labs  Lab 11/11/22 0531  NA 137  K 3.7  CL 106  CO2 24  GLUCOSE 114*  BUN 23  CREATININE 0.70  CALCIUM 8.7*   GFR: Estimated Creatinine Clearance: 86.3 mL/min (by C-G formula based on SCr of 0.7 mg/dL). Liver Function Tests: No results for input(s): "AST", "ALT", "ALKPHOS", "BILITOT", "PROT", "ALBUMIN" in the last 168 hours. No results for input(s): "LIPASE", "AMYLASE" in the last 168 hours. No results for input(s): "AMMONIA" in the last 168 hours. Coagulation Profile: No results for input(s): "INR", "PROTIME" in the last 168 hours. Cardiac Enzymes: No results for input(s): "CKTOTAL", "CKMB", "CKMBINDEX", "TROPONINI" in the last 168 hours. BNP (last 3 results) No results for input(s): "PROBNP" in the last 8760 hours. HbA1C: No results for input(s): "HGBA1C" in the last 72 hours. CBG: No results for input(s): "GLUCAP" in the last 168 hours. Lipid Profile: No results for input(s): "CHOL", "HDL", "LDLCALC", "TRIG", "CHOLHDL", "LDLDIRECT" in the last 72 hours. Thyroid Function Tests: No results for input(s): "TSH", "T4TOTAL", "FREET4", "T3FREE", "THYROIDAB" in the last 72 hours. Anemia Panel: No results for input(s): "VITAMINB12", "FOLATE", "FERRITIN", "TIBC", "IRON", "RETICCTPCT" in the last 72 hours. Sepsis Labs: No results for input(s): "PROCALCITON", "LATICACIDVEN" in the last 168 hours.  No results found for this or any previous visit (from the past 240 hour(s)).    Radiology Studies: No results found.  Scheduled Meds:  amLODipine  10 mg Oral Daily   baclofen  10 mg Oral BID   clonazePAM  0.5 mg Oral BID   docusate sodium  100 mg Oral BID   enoxaparin (LOVENOX) injection  40 mg Subcutaneous Q24H   leptospermum manuka honey  1 Application Topical Daily   melatonin  10 mg Oral QHS   pantoprazole  40 mg Oral Daily   polyethylene glycol  17 g Oral BID   potassium chloride  20 mEq Oral BID   potassium chloride  40 mEq Oral Once   risperiDONE   0.25 mg Oral QHS   senna-docusate  1 tablet Oral BID   Continuous Infusions:  sodium chloride Stopped (11/02/22 1315)     LOS: 27 days   Time spent: 35 minutes   Georgios Kina Loann Quill, MD [facility]  If 7PM-7AM, please contact night-coverage [email or web address] 11/11/2022, 1:58 PM

## 2022-11-11 NOTE — Plan of Care (Signed)

## 2022-11-11 NOTE — Plan of Care (Signed)
  Problem: Clinical Measurements: Goal: Ability to maintain clinical measurements within normal limits will improve Outcome: Progressing   

## 2022-11-12 DIAGNOSIS — T1491XA Suicide attempt, initial encounter: Secondary | ICD-10-CM | POA: Diagnosis not present

## 2022-11-12 NOTE — Progress Notes (Signed)
PROGRESS NOTE    David Kim  QHU:[id number] DOB: 07-11-1946 DOA: 10/15/2022 PCP: Biagio Borg, MD   Brief Narrative:  45 yom w/ paraplegia, severe MDD, persistent adjustment disorder with mixed anxiety and depressed mood ,PUD, gout, HTN, IBS, OSA, prostate cancer, GERD who was recently evaluated under IVC last month after being depressed and expressing suicidal ideation,was treated by psychiatry on their service and cleared for discharge home with decrease in his Klonopin dose on 11/13 but he presented to [facility] long hospital ED after home health aide discovered patient took an unknown amount of pills.   In ED- EMS revealed there were opened bottles of losartan, tizanidine, oxybutynin, baclofen, and clonazepam. Upon initial interview the patient endorsed at least taking clonazepam and tizanidine but would not provide additional information.  Initial evaluation also revealed multiple SIRS criteria with fever of 103.2 and urinalysis suggestive of urinary tract infection prompting initiation of intravenous ceftriaxone.  Patient was initially hospitalized in the stepdown unit for suspected drug overdose with initial concern for sepsis secondary to urinary tract infection.   Urine culture came back positive for pansensitive E. coli several days after hospitalization patient was also identified to have persisting left lower lobe infiltrate on 12/14 thought to be due to concurrent pneumonia.  Antibiotics were therefore broadened to cefepime and vancomycin on 12/14.  Clinical evidence of SIRS/sepsis resolved in the days that followed and course of therapy completed early in the morning on 12/19.   After several days of withholding all of the suspected overdosed agents patient began to exhibit extreme agitation tremulousness and hallucinations concerning for benzodiazepine withdrawal.  Patient was then placed back on scheduled benzodiazepines and baclofen on 12/13.     From 12/13 until 12/19 patient's  mentation gradually improved. Degree of agitation and confusion also gradually began to improve.  Extensive encephalopathy workup was undertaken including obtaining CT head, MRI brain and EEG all of which did not identify a contributing cause.   Psychiatry was also consulted on admission however their assessments have been obscured by ongoing encephalopathy felt to be due to both infection and drug overdose.   Due to guarded prognosis palliative care consultation was additionally obtained.  Assessment & Plan:   Intentional overdose: -Originally, all of patient's home medications were initially held due to the overdose.Patient was then resumed on benzodiazepines and baclofen on 12/13 due to concerns for withdrawal and have been slowly tapered since. Continue sitter one-to-one safety precautions and needs IVC paperwork if if patient attempts to elope. -Psychiatry input appreciated  - continue current Klonopin twice daily, baclofen, melatonin, Risperdal bedtime -Psychiatry changed the recommendation from inpatient psychiatry to long-term care facility since family is concerned that he will attempt suicide if he goes home to live alone again.  As per psychiatry note from 11/10/2021 patient no longer meet criteria for inpatient psychiatry hospitalization.  TOC consulted  Sepsis E. coli bacteremia: -Initially sepsis due to E. coli UTI, resolved. Several days later  left lower lobe infiltrate noted on chest x-ray 12/14 as an additional source.cefepime/vancomycin started on 12/19, completed early the morning of 12/19. -Repeat blood cultures negative.   Acute hypoxic respite failure resolved due to OD -On room air   Toxic metabolic encephalopathy mental status currently improved. -multifactorial.  - including underlying infection (resolved), drug overdose (resolved) and subsequent withdrawal (also resolved). -EEG obtained on 12/15 without obvious seizure activity although this was substantially obscured  by ongoing benzodiazepine use. -Neurology evaluated the case, repeat EEG was unremarkable on 12/20. -  MRI brain unremarkable,Recent TSH, vitamin B12 unremarkable.    Sercoral colitis History of severe constipation GI bleeding  -episodes per rectum 12/12 followed by additional small episodes morning of 12/13. S/p  2 u PRBC on 12/12.  Seen by GI they susspected etiology is due to stercoral colitis with ulceration. -No evidence of GI bleeding since with stable hemoglobin and hematocrit.patient's wife was primarily involved in performing regular rectal stimulation and disimpactions prior to her death.  -Cont aggressive laxative regimen- MiraLAX senna and Colace. -CT imaging suggestive of stercoral colitis.Given his underlying paraplegia, suspect he has neurogenic bowel. Disimpactions as needed   Metabolic acidosis: Resolved.   Severe recurrent major depression:Psychiatry following   Their input is appreciated..Previous physicians had a long discussion with patient's niece, [name] on 12/12 and again (12/19).  Patient has become severely depressed since his wife passed away in 08/20/23.  She was essentially the patient's sole caretaker and he was completely dependent on her.  Since her passing, he has began "shutting down"  GERD continue PPI  Hypokalemia repleted  Essential hypertension BP stable continue amlodipine  Paraplegia: -frequent turning position changing baclofen supportive care to be continued Pressure injury sacral stage II POA continue dressing change frequent turns  Goals of care, counseling/discussion:Goals of care discussion 12/12 with [name] patient's niece along with multiple other family members. No one has power of attorney.  Decision making for patient's care has been shared between the patient's son-in-law, daughter-in-law and niece. Discussed goals of care with niece and family on 12/14.  They are well aware of the patient's guarded prognosis.   Patient is  currently DNR Palliative care has been consulted and is following along, their assistance is appreciated. Per TOC> they will not pursue APS at this time family encouraged to pursue if they felt necessary  DVT prophylaxis: Lovenox Code Status: DNR Family Communication:sitter present at bedside.  Plan of care discussed with patient in length and he verbalized understanding and agreed with it. Disposition Plan: To be determined  Consultants:  Psychiatry GI Neurology  Procedures:  None   Status is: Inpatient     Subjective: Patient seen and examined.  Sitter at the bedside.  Patient lying comfortably on the bed.  Denies any new complaints.  No acute events overnight.  Objective: Vitals:   11/11/22 0429 11/11/22 1325 11/11/22 2028 11/12/22 0554  BP: 127/75 123/69 114/64 127/78  Pulse: 85 90 86 89  Resp: '18 20 18 17  '$ Temp: 98.4 F (36.9 C) 98.7 F (37.1 C) 98.3 F (36.8 C) 98.1 F (36.7 C)  TempSrc: Oral Oral Oral Oral  SpO2: 97% 96% 96% 95%  Weight:      Height:        Intake/Output Summary (Last 24 hours) at 11/12/2022 1034 Last data filed at 11/12/2022 0900 Gross per 24 hour  Intake 960 ml  Output 1400 ml  Net -440 ml    Filed Weights   10/15/22 2347 10/23/22 0000  Weight: 82.2 kg 85.7 kg    Examination:  General exam: Appears calm and comfortable mere, communicating well Respiratory system: Clear to auscultation. Respiratory effort normal. Cardiovascular system: S1 & S2 heard, RRR. No JVD, murmurs, rubs, gallops or clicks. No pedal edema. Gastrointestinal system: Abdomen is nondistended, soft and nontender. No organomegaly or masses felt. Normal bowel sounds heard. Central nervous system: Alert and oriented.  Paraplegic Skin: No rashes, lesions or ulcers Psychiatry: Flat affect Data Reviewed: I have personally reviewed following labs and imaging studies  CBC:  Recent Labs  Lab 11/11/22 0531  WBC 9.9  HGB 14.1  HCT 44.1  MCV 91.5  PLT 231    Basic  Metabolic Panel: Recent Labs  Lab 11/11/22 0531  NA 137  K 3.7  CL 106  CO2 24  GLUCOSE 114*  BUN 23  CREATININE 0.70  CALCIUM 8.7*    GFR: Estimated Creatinine Clearance: 86.3 mL/min (by C-G formula based on SCr of 0.7 mg/dL). Liver Function Tests: No results for input(s): "AST", "ALT", "ALKPHOS", "BILITOT", "PROT", "ALBUMIN" in the last 168 hours. No results for input(s): "LIPASE", "AMYLASE" in the last 168 hours. No results for input(s): "AMMONIA" in the last 168 hours. Coagulation Profile: No results for input(s): "INR", "PROTIME" in the last 168 hours. Cardiac Enzymes: No results for input(s): "CKTOTAL", "CKMB", "CKMBINDEX", "TROPONINI" in the last 168 hours. BNP (last 3 results) No results for input(s): "PROBNP" in the last 8760 hours. HbA1C: No results for input(s): "HGBA1C" in the last 72 hours. CBG: No results for input(s): "GLUCAP" in the last 168 hours. Lipid Profile: No results for input(s): "CHOL", "HDL", "LDLCALC", "TRIG", "CHOLHDL", "LDLDIRECT" in the last 72 hours. Thyroid Function Tests: No results for input(s): "TSH", "T4TOTAL", "FREET4", "T3FREE", "THYROIDAB" in the last 72 hours. Anemia Panel: No results for input(s): "VITAMINB12", "FOLATE", "FERRITIN", "TIBC", "IRON", "RETICCTPCT" in the last 72 hours. Sepsis Labs: No results for input(s): "PROCALCITON", "LATICACIDVEN" in the last 168 hours.  No results found for this or any previous visit (from the past 240 hour(s)).    Radiology Studies: No results found.  Scheduled Meds:  amLODipine  10 mg Oral Daily   baclofen  10 mg Oral BID   clonazePAM  0.5 mg Oral BID   docusate sodium  100 mg Oral BID   enoxaparin (LOVENOX) injection  40 mg Subcutaneous Q24H   leptospermum manuka honey  1 Application Topical Daily   melatonin  10 mg Oral QHS   pantoprazole  40 mg Oral Daily   polyethylene glycol  17 g Oral BID   potassium chloride  20 mEq Oral BID   potassium chloride  40 mEq Oral Once    risperiDONE  0.25 mg Oral QHS   senna-docusate  1 tablet Oral BID   Continuous Infusions:  sodium chloride Stopped (11/02/22 1315)     LOS: 28 days   Time spent: 35 minutes   Darnice Comrie Loann Quill, MD [facility]  If 7PM-7AM, please contact night-coverage [email or web address] 11/12/2022, 10:34 AM

## 2022-11-12 NOTE — Plan of Care (Signed)
  Problem: Pain Managment: Goal: General experience of comfort will improve Outcome: Progressing   Problem: Safety: Goal: Ability to remain free from injury will improve Outcome: Progressing   Problem: Nutrition: Goal: Adequate nutrition will be maintained Outcome: Progressing   

## 2022-11-12 NOTE — TOC Progression Note (Addendum)
Transition of Care [facility]) - Progression Note    Patient Details  Name: MINORU CHAP MRN: [id number] Date of Birth: [birth year]  [facility] of [facility]) CM/SW Contact  Servando Snare, [place] Phone Number: 11/12/2022, 2:50 PM  Clinical Narrative:   Notified by attending that Psych is recommending LTC placement. Added Nancy Marus to secure chat. Will leave handoff for weekday staff to follow up.   Per CM note on 12/29, looks like APS was involved and possible petition for guardianship.     Expected Discharge Plan: [facility] Barriers to Discharge: Continued Medical Work up  Expected Discharge Plan and Services   Discharge Planning Services: CM Consult   Living arrangements for the past 2 months: [place] Determinants of Health (SDOH) Interventions SDOH Screenings   Food Insecurity: No Food Insecurity (10/20/2022)  Housing: Low Risk  (10/20/2022)  Transportation Needs: No Transportation Needs (10/20/2022)  Utilities: Not At Risk (10/20/2022)  Alcohol Screen: Low Risk  (07/24/2022)  Depression (PHQ2-9): Low Risk  (04/05/2022)  Financial Resource Strain: Low Risk  (07/24/2022)  Physical Activity: Inactive (07/24/2022)  Social Connections: Moderately Isolated (07/24/2022)  Stress: No Stress Concern Present (07/24/2022)  Tobacco Use: Low Risk  (10/20/2022)    Readmission Risk Interventions    10/27/2022   10:19 AM  Readmission Risk Prevention Plan  Transportation Screening Complete  PCP or Specialist Appt within 5-7 Days Complete  Home Care Screening Complete  Medication Review (RN CM) Complete

## 2022-11-13 DIAGNOSIS — T50902S Poisoning by unspecified drugs, medicaments and biological substances, intentional self-harm, sequela: Secondary | ICD-10-CM | POA: Diagnosis not present

## 2022-11-13 DIAGNOSIS — A4151 Sepsis due to Escherichia coli [E. coli]: Secondary | ICD-10-CM | POA: Diagnosis not present

## 2022-11-13 DIAGNOSIS — G928 Other toxic encephalopathy: Secondary | ICD-10-CM | POA: Diagnosis not present

## 2022-11-13 DIAGNOSIS — R7881 Bacteremia: Secondary | ICD-10-CM | POA: Diagnosis not present

## 2022-11-13 NOTE — Plan of Care (Signed)
  Problem: Clinical Measurements: Goal: Will remain free from infection Outcome: Adequate for Discharge Goal: Diagnostic test results will improve Outcome: Adequate for Discharge Goal: Respiratory complications will improve Outcome: Adequate for Discharge Goal: Cardiovascular complication will be avoided Outcome: Adequate for Discharge   Problem: Nutrition: Goal: Adequate nutrition will be maintained Outcome: Adequate for Discharge   Problem: Coping: Goal: Level of anxiety will decrease Outcome: Adequate for Discharge
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
## 2022-11-13 NOTE — Progress Notes (Addendum)
PROGRESS NOTE    David Kim  LAG:[id number] DOB: 10/16/1946 DOA: 10/15/2022 PCP: Biagio Borg, MD    Brief Narrative:  77 years old paraplegic, manic depressive disorder, persistent adjustment disorder with mixed anxiety and depression, peptic ulcer disease, gout, hypertension, irritable bowel syndrome, obstructive sleep apnea, prostate cancer, GERD who was recently placed in IVC last month for depression and suicidal ideation.  At that time he was cleared to go home with decreased Klonopin dose but again presented to hospital with unknown amount of pill ingestion including opened bottles of  losartan, tizanidine, oxybutynin, baclofen, and clonazepam.   Initial evaluation also revealed multiple SIRS criteria with fever of 103.2 and urinalysis suggestive of urinary tract infection prompting initiation of intravenous ceftriaxone.  Patient was initially hospitalized in the stepdown unit for suspected drug overdose with initial concern for sepsis secondary to urinary tract infection.  Urine culture was positive for pansensitive E. coli.  Patient also had left lower lobe infiltrate and received cefepime and vancomycin.  After several days of resolving suspected medication patient had agitation tremors and hallucinations concerning for benzodiazepine withdrawal and was put back on benzodiazepines from 12/13 until 12/19 patient's mentation gradually improved.  Extensive encephalopathy workup was undertaken including obtaining CT head, MRI brain and EEG all of which did not identify a contributing cause. Psychiatry was also consulted on admission and do not suggest inpatient psych admission or suicidality at this time.  Due to guarded prognosis palliative care consultation was additionally obtained.     Assessment and Plan:  * Intentional overdose ([place]) Suspected intentional overdose, found to have open bottles of losartan, tizanidine, oxybutynin, baclofen and clonazepam by EMS.  Quantity was unclear.   Was resumed on benzodiazepines and baclofen since 1213 due to withdrawals.  On one-to-one sitter.  No suicidal precautions are needed at this time.  Psychiatry followed the patient during hospitalization and recommend no inpatient psychiatry admission.  Sepsis from E. coli UTI. Patient also had infiltrate.  Was initially on ceftriaxone and subsequently vancomycin was added has improved at this time.  Repeat blood cultures have been negative.  Acute hypoxemic respiratory failure (HCC)-resolved as of 10/16/2022 In the setting of overdose.  No signs of aspiration.  Toxic metabolic encephalopathy Patient had confusion, myoclonus hallucinations likely secondary to initial overdose followed by withdrawal.  Off cefepime at this time.  Has been resumed back on benzodiazepines and baclofen.  This time patient is more calm and cooperative.  Previous provider had spoken with neurology.  MRI of the brain was unremarkable.  TSH B12 within normal range.  EEG on 12/15 without obvious seizure activity although this was substantially obscured by ongoing benzodiazepine use.  GIB (gastrointestinal bleeding) Episode of bright red blood per rectum on 12/12 followed by additional small episode morning of 12/13.  Patient had received 2 units of packed RBC on 10/17/2022.  GI was consulted and impression was stercoral colitis with ulceration.  No further episodes at this time.  Bacteremia due to Escherichia coli Antibiotic course completed on 12/19.  Stercoral colitis history of severe constipation..  Patient had history of regular rectal stimulation and disimpaction in the past.  Continue aggressive regimen with MiraLAX, senna and Colace.  Patient likely has neurogenic bowel given his history of paraplegia.  Disimpaction as needed.  Metabolic acidosis Secondary to overdose.  No anion gap at this time.  Improved.  Severe recurrent major depression [facility]) Psychiatry f followed the patient during hospitalization.   Patient has become severely depressed since his  wife passing away in October. She was essentially the patient's sole caretaker and he was completely dependent on her.  Since her passing, he has began "shutting down".  Continue Haldol risperidone and Ativan.  Pressure injury of sacral region, stage 2  Present on admission.  Continue wound care.  Essential hypertension Continue amlodipine 10 mg daily, as needed antihypertensive.  Paraplegia [facility]) Fall precautions.  Continue pressure ulcer prevention protocol.  GERD Continue oral Protonix  Hypokalemia Replaced.  Latest potassium of 3.7.  Goals of care, counseling/discussion No one has power of attorney.  Decision making for patient's care has been shared between the patient's son-in-law, daughter-in-law and niece by previous provider..  DNR.  Guarded prognosis overall.   DVT prophylaxis: enoxaparin (LOVENOX) injection 40 mg Start: 10/16/22 1000   Code Status:     Code Status: DNR  Disposition: Skilled nursing facility  Status is: Inpatient  Remains inpatient appropriate because: Awaiting  for disposition.   Family Communication: None at bedside. Spoke with Ms colleen on the phone and updated her.  Consultants:  Psychiatry  Procedures:  None  Antimicrobials:  None currently  Subjective: Today, patient was seen and examined at bedside.  Patient appears to be and composed.  Sitter at bedside.  Was able to eat something this morning.  No mention of nausea vomiting fever or chills.  Objective: Vitals:   11/12/22 2008 11/13/22 0257 11/13/22 0612 11/13/22 1252  BP: 115/67  131/72 119/74  Pulse: 88  82 (!) 110  Resp: '16  16 20  '$ Temp: 98.4 F (36.9 C)  97.9 F (36.6 C) 98.4 F (36.9 C)  TempSrc: Oral  Oral Oral  SpO2: 97%  96% 97%  Weight:  76.6 kg    Height:        Intake/Output Summary (Last 24 hours) at 11/13/2022 1308 Last data filed at 11/13/2022 1209 Gross per 24 hour  Intake 1800 ml  Output 1000 ml  Net 800  ml   Filed Weights   10/15/22 2347 10/23/22 0000 11/13/22 0257  Weight: 82.2 kg 85.7 kg 76.6 kg    Physical Examination: Body mass index is 25.68 kg/m.   General:  Average built, not in obvious distress Communicative, alert awake, HENT:   No scleral pallor or icterus noted. Oral mucosa is moist.  Chest:  Clear breath sounds.  Diminished breath sounds bilaterally. No crackles or wheezes.  CVS: S1 &S2 heard. No murmur.  Regular rate and rhythm. Abdomen: Soft, nontender, nondistended.  Bowel sounds are heard.   Extremities: No cyanosis, clubbing, paraparesis Psych: Alert, awake and oriented, flatfeet CNS:  No cranial nerve deficits.  Paraparesis Skin: Warm and dry.  Pressure injury sacral area.  Data Reviewed:   CBC: Recent Labs  Lab 11/11/22 0531  WBC 9.9  HGB 14.1  HCT 44.1  MCV 91.5  PLT 086    Basic Metabolic Panel: Recent Labs  Lab 11/11/22 0531  NA 137  K 3.7  CL 106  CO2 24  GLUCOSE 114*  BUN 23  CREATININE 0.70  CALCIUM 8.7*    Liver Function Tests: No results for input(s): "AST", "ALT", "ALKPHOS", "BILITOT", "PROT", "ALBUMIN" in the last 168 hours.   Radiology Studies: No results found.    LOS: 29 days    Flora Lipps, MD [facility] Available via Epic secure chat 7am-7pm After these hours, please refer to coverage provider listed on [email or web address] 11/13/2022, 1:08 PM

## 2022-11-13 NOTE — Care Management (Signed)
Transition of Care (TOC) -30 day Note       Patient Details   Name: David Kim, David Kim  AJG:[id number]  Date of Birth:11-Oct-1946     Transition of Care [facility]) CM/SW Contact   Name:[facility]  Phone Number:[phone]  Date:11/13/2022  Time:4:38p     MUST BT:[id number]     To Whom it May Concern:     Please be advised that the above patient will require a short-term nursing home stay, anticipated 30 days or less rehabilitation and strengthening. The plan is for return home.
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
## 2022-11-13 NOTE — TOC Progression Note (Addendum)
Transition of Care [facility]) - Progression Note    Patient Details  Name: David Kim MRN: [id number] Date of Birth: 12-Dec-1945  Transition of Care [facility]) CM/SW Contact  Mihcael Ledee, Juliann Pulse, RN Phone Number: 11/13/2022, 1:15 PM  Clinical Narrative:  [facility] accepted for ST SNF, [place] Architectural technologist. -4p patient & [name](sister) aware of faxing out for ST SNF-await bed offers. Coleen sister wants MD/Psych to contact for updates.MD notified.      Expected Discharge Plan: Psychiatric Hospital Barriers to Discharge: Continued Medical Work up  Expected Discharge Plan and Services   Discharge Planning Services: CM Consult   Living arrangements for the past 2 months: [place] Determinants of Health (SDOH) Interventions SDOH Screenings   Food Insecurity: No Food Insecurity (10/20/2022)  Housing: Low Risk  (10/20/2022)  Transportation Needs: No Transportation Needs (10/20/2022)  Utilities: Not At Risk (10/20/2022)  Alcohol Screen: Low Risk  (07/24/2022)  Depression (PHQ2-9): Low Risk  (04/05/2022)  Financial Resource Strain: Low Risk  (07/24/2022)  Physical Activity: Inactive (07/24/2022)  Social Connections: Moderately Isolated (07/24/2022)  Stress: No Stress Concern Present (07/24/2022)  Tobacco Use: Low Risk  (10/20/2022)    Readmission Risk Interventions    10/27/2022   10:19 AM  Readmission Risk Prevention Plan  Transportation Screening Complete  PCP or Specialist Appt within 5-7 Days Complete  Home Care Screening Complete  Medication Review (RN CM) Complete
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
## 2022-11-13 NOTE — Progress Notes (Incomplete Revision)
PROGRESS NOTE    David Kim  KZS:[id number] DOB: 1946-06-01 DOA: 10/15/2022 PCP: Biagio Borg, MD    Brief Narrative:  77 years old paraplegic, manic depressive disorder, persistent adjustment disorder with mixed anxiety and depression, peptic ulcer disease, gout, hypertension, irritable bowel syndrome, obstructive sleep apnea, prostate cancer, GERD who was recently placed in IVC last month for depression and suicidal ideation.  At that time he was cleared to go home with decreased Klonopin dose but again presented to hospital with unknown amount of pill ingestion including opened bottles of  losartan, tizanidine, oxybutynin, baclofen, and clonazepam.   Initial evaluation also revealed multiple SIRS criteria with fever of 103.2 and urinalysis suggestive of urinary tract infection prompting initiation of intravenous ceftriaxone.  Patient was initially hospitalized in the stepdown unit for suspected drug overdose with initial concern for sepsis secondary to urinary tract infection.  Urine culture was positive for pansensitive E. coli.  Patient also had left lower lobe infiltrate and received cefepime and vancomycin.  After several days of resolving suspected medication patient had agitation tremors and hallucinations concerning for benzodiazepine withdrawal and was put back on benzodiazepines from 12/13 until 12/19 patient's mentation gradually improved.  Extensive encephalopathy workup was undertaken including obtaining CT head, MRI brain and EEG all of which did not identify a contributing cause. Psychiatry was also consulted on admission and do not suggest inpatient psych admission or suicidality at this time.  Due to guarded prognosis palliative care consultation was additionally obtained.     Assessment and Plan:  * Intentional overdose ([place]) Suspected intentional overdose, found to have open bottles of losartan, tizanidine, oxybutynin, baclofen and clonazepam by EMS.  Quantity was unclear.   Was resumed on benzodiazepines and baclofen since 1213 due to withdrawals.  On one-to-one sitter.  No suicidal precautions are needed at this time.  Psychiatry followed the patient during hospitalization and recommend no inpatient psychiatry admission.  Sepsis from E. coli UTI. Patient also had infiltrate.  Was initially on ceftriaxone and subsequently vancomycin was added has improved at this time.  Repeat blood cultures have been negative.  Acute hypoxemic respiratory failure (HCC)-resolved as of 10/16/2022 In the setting of overdose.  No signs of aspiration.  Toxic metabolic encephalopathy Patient had confusion, myoclonus hallucinations likely secondary to initial overdose followed by withdrawal.  Off cefepime at this time.  Has been resumed back on benzodiazepines and baclofen.  This time patient is more calm and cooperative.  Previous provider had spoken with neurology.  MRI of the brain was unremarkable.  TSH B12 within normal range.  EEG on 12/15 without obvious seizure activity although this was substantially obscured by ongoing benzodiazepine use.  GIB (gastrointestinal bleeding) Episode of bright red blood per rectum on 12/12 followed by additional small episode morning of 12/13.  Patient had received 2 units of packed RBC on 10/17/2022.  GI was consulted and impression was stercoral colitis with ulceration.  No further episodes at this time.  Bacteremia due to Escherichia coli Antibiotic course completed on 12/19.  Stercoral colitis history of severe constipation..  Patient had history of regular rectal stimulation and disimpaction in the past.  Continue aggressive regimen with MiraLAX, senna and Colace.  Patient likely has neurogenic bowel given his history of paraplegia.  Disimpaction as needed.  Metabolic acidosis Secondary to overdose.  No anion gap at this time.  Improved.  Severe recurrent major depression [facility]) Psychiatry f followed the patient during hospitalization.   Patient has become severely depressed since his  wife passing away in October. She was essentially the patient's sole caretaker and he was completely dependent on her.  Since her passing, he has began "shutting down".  Continue Haldol risperidone and Ativan.  Pressure injury of sacral region, stage 2  Present on admission.  Continue wound care.  Essential hypertension Continue amlodipine 10 mg daily, as needed antihypertensive.  Paraplegia [facility]) Fall precautions.  Continue pressure ulcer prevention protocol.  GERD Continue oral Protonix  Hypokalemia Replaced.  Latest potassium of 3.7.  Goals of care, counseling/discussion No one has power of attorney.  Decision making for patient's care has been shared between the patient's son-in-law, daughter-in-law and niece by previous provider..  DNR.  Guarded prognosis overall.   DVT prophylaxis: enoxaparin (LOVENOX) injection 40 mg Start: 10/16/22 1000   Code Status:     Code Status: DNR  Disposition: Skilled nursing facility  Status is: Inpatient  Remains inpatient appropriate because: Awaiting  for disposition.   Family Communication: None at bedside. Spoke with Ms colleen on the phone and updated her.  Consultants:  Psychiatry  Procedures:  None  Antimicrobials:  None currently  Subjective: Today, patient was seen and examined at bedside.  Patient appears to be and composed.  Sitter at bedside.  Was able to eat something this morning.  No mention of nausea vomiting fever or chills.  Objective: Vitals:   11/12/22 2008 11/13/22 0257 11/13/22 0612 11/13/22 1252  BP: 115/67  131/72 119/74  Pulse: 88  82 (!) 110  Resp: '16  16 20  '$ Temp: 98.4 F (36.9 C)  97.9 F (36.6 C) 98.4 F (36.9 C)  TempSrc: Oral  Oral Oral  SpO2: 97%  96% 97%  Weight:  76.6 kg    Height:        Intake/Output Summary (Last 24 hours) at 11/13/2022 1308 Last data filed at 11/13/2022 1209 Gross per 24 hour  Intake 1800 ml  Output 1000 ml  Net 800  ml   Filed Weights   10/15/22 2347 10/23/22 0000 11/13/22 0257  Weight: 82.2 kg 85.7 kg 76.6 kg    Physical Examination: Body mass index is 25.68 kg/m.   General:  Average built, not in obvious distress Communicative, alert awake, HENT:   No scleral pallor or icterus noted. Oral mucosa is moist.  Chest:  Clear breath sounds.  Diminished breath sounds bilaterally. No crackles or wheezes.  CVS: S1 &S2 heard. No murmur.  Regular rate and rhythm. Abdomen: Soft, nontender, nondistended.  Bowel sounds are heard.   Extremities: No cyanosis, clubbing, paraparesis Psych: Alert, awake and oriented, flatfeet CNS:  No cranial nerve deficits.  Paraparesis Skin: Warm and dry.  Pressure injury sacral area.  Data Reviewed:   CBC: Recent Labs  Lab 11/11/22 0531  WBC 9.9  HGB 14.1  HCT 44.1  MCV 91.5  PLT 119    Basic Metabolic Panel: Recent Labs  Lab 11/11/22 0531  NA 137  K 3.7  CL 106  CO2 24  GLUCOSE 114*  BUN 23  CREATININE 0.70  CALCIUM 8.7*    Liver Function Tests: No results for input(s): "AST", "ALT", "ALKPHOS", "BILITOT", "PROT", "ALBUMIN" in the last 168 hours.   Radiology Studies: No results found.    LOS: 29 days    Flora Lipps, MD [facility] Available via Epic secure chat 7am-7pm After these hours, please refer to coverage provider listed on [email or web address] 11/13/2022, 1:08 PM

## 2022-11-13 NOTE — Progress Notes (Signed)
Physical Therapy Treatment Patient Details Name: David Kim MRN: [id number] DOB: 24-Apr-1946 Today's Date: 11/13/2022   History of Present Illness David Kim is a 77 yo male with PMH paraplegia, severe MDD, persistent adjustment disorder with mixed anxiety and depressed mood ,PUD, gout, HTN, IBS, OSA, prostate cancer, GERD.   He was recently evaluated under IVC last month for expressing suicidal ideation. he was cleared for discharge home on 11/13.  Patient presented to [facility] long ED after home health aide discovered patient took an unknown amount of pills on 10/15/22    PT Comments    Pt AxO x 3 very pleasant and always willing to get OOB into his wheelchair.   General bed mobility comments: assist for upper body and use of bed pad to complete scooting to EOB.  Much B UE support to prevent forward LOB. General transfer comment: using a long sliding board to scoot transfer from bed to pt's personal wheelchair at [place] Pt self able 50% with caution to guard/prevent forward LOB.Product manager mobility: Yes Distance: Clinical biochemist Details (indicate cue type and reason): NO VC's needed.  Pt fully capable to navigate around corners and perfom 180 turns. Left pt in wheelchair with safety sitter.  Asked safety sitter to let pt propel self around unit again after lunch. Pt will need ST Rehab at SNF to regain his prior transfer ability and level of wheelchair Indep.    Recommendations for follow up therapy are one component of a multi-disciplinary discharge planning process, led by the attending physician.  Recommendations may be updated based on patient status, additional functional criteria and insurance authorization.  Follow Up Recommendations  Skilled nursing-short term rehab (<3 hours/day) Can patient physically be transported by private vehicle: No   Assistance Recommended at Discharge Frequent or constant Supervision/Assistance  Patient can return home  with the following A lot of help with walking and/or transfers;A lot of help with bathing/dressing/bathroom;Assist for transportation;Help with stairs or ramp for entrance   Equipment Recommendations  None recommended by PT    Recommendations for Other Services       Precautions / Restrictions Precautions Precautions: Fall Precaution Comments: paraplegia T12 , incontinence Restrictions Weight Bearing Restrictions: No Other Position/Activity Restrictions: L LE spastic/R LE flaccid     Mobility  Bed Mobility Overal bed mobility: Needs Assistance Bed Mobility: Supine to Sit     Supine to sit: Mod assist, Max assist     General bed mobility comments: assist for upper body and use of bed pad to complete scooting to EOB.  Much B UE support to prevent forward LOB.    Transfers Overall transfer level: Needs assistance Equipment used: Sliding board Transfers: Bed to chair/wheelchair/BSC             General transfer comment: using a long sliding board to scoot transfer from bed to pt's personal wheelchair at [place] Pt self able 50% with caution to guard/prevent forward LOB.    Ambulation/Gait               General Gait Details: NON amb.  Pt uses a wheelchair for mobility.   Stairs             Information systems manager mobility: Yes Distance: Clinical biochemist Details (indicate cue type and reason): NO VC's needed.  Pt fully capable to navigate around corners and perfom 180 turns.  Modified Rankin (Stroke Patients Only)       Balance  Cognition Arousal/Alertness: Awake/alert Behavior During Therapy: WFL for tasks assessed/performed Overall Cognitive Status: Within Functional Limits for tasks assessed                                 General Comments: AxO x 3 allways willing to get OOB.  Brother in [place] brought pt's person  wheelchair along with his short sliding board.        Exercises      General Comments        Pertinent Vitals/Pain      Home Living                          Prior Function            PT Goals (current goals can now be found in the care plan section)      Frequency    Min 2X/week      PT Plan Current plan remains appropriate    Co-evaluation              AM-PAC PT "6 Clicks" Mobility   Outcome Measure  Help needed turning from your back to your side while in a flat bed without using bedrails?: A Lot Help needed moving from lying on your back to sitting on the side of a flat bed without using bedrails?: A Lot Help needed moving to and from a bed to a chair (including a wheelchair)?: A Lot Help needed standing up from a chair using your arms (e.g., wheelchair or bedside chair)?: Total Help needed to walk in hospital room?: Total Help needed climbing 3-5 steps with a railing? : Total 6 Click Score: 9    End of Session Equipment Utilized During Treatment: Gait belt Activity Tolerance: Patient tolerated treatment well Patient left: in chair;with call bell/phone within reach;with nursing/sitter in room Nurse Communication: Mobility status PT Visit Diagnosis: Other symptoms and signs involving the nervous system (R29.898)     Time: 0814-4818 PT Time Calculation (min) (ACUTE ONLY): 40 min  Charges:  $Therapeutic Activity: 23-37 mins $Wheel Chair Management: 8-22 mins                    Rica Koyanagi  PTA [place] Office M-F          [phone] Weekend pager [phone]
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
## 2022-11-14 DIAGNOSIS — G928 Other toxic encephalopathy: Secondary | ICD-10-CM | POA: Diagnosis not present

## 2022-11-14 DIAGNOSIS — R7881 Bacteremia: Secondary | ICD-10-CM | POA: Diagnosis not present

## 2022-11-14 DIAGNOSIS — A419 Sepsis, unspecified organism: Secondary | ICD-10-CM | POA: Diagnosis not present

## 2022-11-14 DIAGNOSIS — T50902S Poisoning by unspecified drugs, medicaments and biological substances, intentional self-harm, sequela: Secondary | ICD-10-CM | POA: Diagnosis not present

## 2022-11-14 NOTE — Progress Notes (Addendum)
Chaplain assisted David Kim with getting his HCPOA and living will notarized.  A copy has been scanned into his chart and he was given the additional copies.  His brother-in-law has them for safe-keeping.  His brother-in-law, David Kim, spoke with me afterward to raise significant concern that placement is only being sought for short-term care for [name]. Though he and his wife are willing to serve as HCPOA if he is not able to speak for himself, they are unable and unwilling to assist with securing long term placement for him nor are the able or willing to care for him at home.  This message was communicated with the medical team as well.  David Kim is the husband of David Kim's sister who is now deceased and they have been caring for him since her death. They have removed all firearms from the home, but will not be able to do any additional support if he returns there.  David Kim also has a niece, David Kim, who has been involved but who, according to [name], also feels that long-term placement is the only option.  [address] [place] Pager, [phone]

## 2022-11-14 NOTE — Progress Notes (Signed)
PROGRESS NOTE    David Kim  KLK:[id number] DOB: [birth year] DOA: 10/15/2022 PCP: Biagio Borg, MD    Brief Narrative:  77 years old paraplegic, manic depressive disorder, persistent adjustment disorder with mixed anxiety and depression, peptic ulcer disease, gout, hypertension, irritable bowel syndrome, obstructive sleep apnea, prostate cancer, GERD who was recently placed in IVC last month for depression and suicidal ideation.  At that time he was cleared to go home with decreased Klonopin dose but again presented to hospital with unknown amount of pill ingestion including opened bottles of  losartan, tizanidine, oxybutynin, baclofen, and clonazepam.   Initial evaluation also revealed multiple SIRS criteria with fever of 103.2 and urinalysis suggestive of urinary tract infection prompting initiation of intravenous ceftriaxone.  Patient was initially hospitalized in the stepdown unit for suspected drug overdose with initial concern for sepsis secondary to urinary tract infection.  Urine culture was positive for pansensitive E. coli.  Patient also had left lower lobe infiltrate and received cefepime and vancomycin.  After several days of resolving suspected medication patient had agitation tremors and hallucinations concerning for benzodiazepine withdrawal and was put back on benzodiazepines from 12/13 until 12/19 patient's mentation gradually improved.  Extensive encephalopathy workup was undertaken including obtaining CT head, MRI brain and EEG all of which did not identify a contributing cause. Psychiatry was also consulted on admission and do not suggest inpatient psych admission or suicidality at this time.  Due to guarded prognosis, palliative care consultation was additionally obtained.     Assessment and Plan:  * Intentional overdose ([place]) Suspected intentional overdose, found to have open bottles of losartan, tizanidine, oxybutynin, baclofen and clonazepam by EMS.  Quantity was unclear.   Was resumed on benzodiazepines and baclofen since 12/13 due to withdrawals. Off one-to-one sitter.  No suicidal precautions are needed at this time.  Psychiatry followed the patient during hospitalization and recommend no inpatient psychiatry admission.  Sepsis from E. coli UTI. Patient also had infiltrate.  Was initially on ceftriaxone and subsequently vancomycin was added has improved at this time.  Repeat blood cultures have been negative.  Improved and resolved at this time.  Acute hypoxemic respiratory failure (HCC)-resolved as of 10/16/2022 In the setting of overdose.  No signs of aspiration.  Currently on room air.  Toxic metabolic encephalopathy Patient had confusion, myoclonus hallucinations likely secondary to initial overdose followed by withdrawal.  Off cefepime at this time.  Has been resumed back on benzodiazepines and baclofen.  This time patient is more calm and cooperative.  Previous provider had spoken with neurology.  MRI of the brain was unremarkable.  TSH B12 within normal range.  EEG on 12/15 without obvious seizure activity although this was substantially obscured by ongoing benzodiazepine use.  Encephalopathy has cleared at this time patient is alert awake oriented.  GIB (gastrointestinal bleeding) Episode of bright red blood per rectum on 12/12 followed by additional small episode morning of 12/13.  Patient had received 2 units of packed RBC on 10/17/2022.  GI was consulted and impression was stercoral colitis with ulceration.  No further episodes at this time.  Bacteremia due to Escherichia coli Antibiotic course completed on 12/19.  Stercoral colitis history of severe constipation..  Patient had history of regular rectal stimulation and disimpaction in the past.  Continue aggressive regimen with MiraLAX, senna and Colace.  Patient likely has neurogenic bowel given his history of paraplegia.  Disimpaction as needed.  Metabolic acidosis Secondary to overdose.  No anion  gap at this time.  Improved.  Severe recurrent major depression [facility]) Psychiatry followed the patient during hospitalization.  Patient has become severely depressed since his wife passing away in October. She was essentially the patient's sole caretaker and he was completely dependent on her.  Since her passing, he has began "shutting down".  Continue Haldol risperidone and Ativan.  Currently denies any suicidal ideation.  Pressure injury of sacral region, stage 2  Present on admission.  Continue wound care.  Essential hypertension Continue amlodipine 10 mg daily, as needed antihypertensive.  Paraplegia [facility]) Fall precautions.  Continue pressure ulcer prevention protocol.  GERD Continue oral Protonix  Hypokalemia No labs in the last 72 hours.  Will check BMP in AM.  Goals of care, counseling/discussion No one has power of attorney.  Decision making for patient's care has been shared between the patient's son-in-law, daughter-in-law and niece by previous provider..  DNR.  Guarded prognosis overall.   DVT prophylaxis: enoxaparin (LOVENOX) injection 40 mg Start: 10/16/22 1000   Code Status:     Code Status: DNR  Disposition: Skilled nursing facility.  TOC on board.  Status is: Inpatient  Remains inpatient appropriate because: Awaiting  for disposition.  Appears medically stable for disposition.   Family Communication:  I did spoke with Ms colleen on the phone and updated her on 11/13/2022.  Consultants:  Psychiatry  Procedures:  None  Antimicrobials:  None currently  Subjective: Today, patient was seen and examined at bedside.  Denies any nausea vomiting fever chills or rigor.  Alert awake and Communicative.  Looks like he has had a bowel movement.  Has been eating some.  No sitter at bedside.  Objective: Vitals:   11/13/22 1252 11/13/22 2046 11/14/22 0515 11/14/22 0931  BP: 119/74 131/79 130/78 132/72  Pulse: (!) 110 89 92 88  Resp: '20 16 12   '$ Temp: 98.4 F (36.9  C) 98.1 F (36.7 C) 98.3 F (36.8 C)   TempSrc: Oral Oral Oral   SpO2: 97% 97% 97%   Weight:      Height:        Intake/Output Summary (Last 24 hours) at 11/14/2022 1059 Last data filed at 11/14/2022 0630 Gross per 24 hour  Intake 900 ml  Output 1400 ml  Net -500 ml    Filed Weights   10/15/22 2347 10/23/22 0000 11/13/22 0257  Weight: 82.2 kg 85.7 kg 76.6 kg    Physical Examination: Body mass index is 25.68 kg/m.   General: Alert awake and Communicative, not in distress, average built.   HENT:   No scleral pallor or icterus noted. Oral mucosa is moist.  Chest:  Clear breath sounds. No crackles or wheezes.  CVS: S1 &S2 heard. No murmur.  Regular rate and rhythm. Abdomen: Soft, nontender, nondistended.  Bowel sounds are heard.   Extremities: No cyanosis, clubbing, paraparesis Psych: Alert, awake and oriented, Communicative, flat affect, CNS:  No cranial nerve deficits.  Paraplegia, Skin: Warm and dry.  Pressure injury sacral area.  Data Reviewed:   CBC: Recent Labs  Lab 11/11/22 0531  WBC 9.9  HGB 14.1  HCT 44.1  MCV 91.5  PLT 231     Basic Metabolic Panel: Recent Labs  Lab 11/11/22 0531  NA 137  K 3.7  CL 106  CO2 24  GLUCOSE 114*  BUN 23  CREATININE 0.70  CALCIUM 8.7*     Liver Function Tests: No results for input(s): "AST", "ALT", "ALKPHOS", "BILITOT", "PROT", "ALBUMIN" in the last 168 hours.   Radiology Studies: No results  found.    LOS: 30 days    Flora Lipps, MD [facility] Available via Epic secure chat 7am-7pm After these hours, please refer to coverage provider listed on [email or web address] 11/14/2022, 10:59 AM

## 2022-11-14 NOTE — Progress Notes (Signed)
Chaplain followed up with David Kim's brother-in-law, David Kim, after speaking with the director of Transitions of Care.  Chaplain relayed to David Kim that [facility] can only assist with short term placement at this time even though psychiatry recommended long term placement. David Kim is not currently eligible for long-term medicaid due to his financial situation.  He will need to pay out of pocket for some time until he is eligible for that, at which time, the csw at the facility would be able to assist with filling out paperwork for long term medicaid and apply for long term care.  David Kim feels relieved that this process will be able to continue at the facility and would like to do what is necessary to speed up this process of placement even if it is short term.    [address] [place] Pager, [phone]

## 2022-11-14 NOTE — TOC Progression Note (Signed)
Transition of Care [facility]) - Progression Note    Patient Details  Name: [name] MRN: [id number] Date of Birth: [birth year]  Transition of Care [facility]) CM/SW Contact  Purcell Mouton, RN Phone Number: 11/14/2022, 10:26 AM  Clinical Narrative:     Spoke with brother in law [place] concerning discharge plan and bed offer. [name] accepted [facility] for pt. Waiting for [facility].   Expected Discharge Plan: Psychiatric Hospital Barriers to Discharge: Continued Medical Work up  Expected Discharge Plan and Services   Discharge Planning Services: CM Consult   Living arrangements for the past 2 months: [place] Determinants of Health (SDOH) Interventions SDOH Screenings   Food Insecurity: No Food Insecurity (10/20/2022)  Housing: Low Risk  (10/20/2022)  Transportation Needs: No Transportation Needs (10/20/2022)  Utilities: Not At Risk (10/20/2022)  Alcohol Screen: Low Risk  (07/24/2022)  Depression (PHQ2-9): Low Risk  (04/05/2022)  Financial Resource Strain: Low Risk  (07/24/2022)  Physical Activity: Inactive (07/24/2022)  Social Connections: Moderately Isolated (07/24/2022)  Stress: No Stress Concern Present (07/24/2022)  Tobacco Use: Low Risk  (10/20/2022)    Readmission Risk Interventions    10/27/2022   10:19 AM  Readmission Risk Prevention Plan  Transportation Screening Complete  PCP or Specialist Appt within 5-7 Days Complete  Home Care Screening Complete  Medication Review (RN CM) Complete

## 2022-11-14 NOTE — Plan of Care (Signed)
  Problem: Clinical Measurements: Goal: Ability to maintain clinical measurements within normal limits will improve Outcome: Progressing Goal: Will remain free from infection Outcome: Progressing   Problem: Coping: Goal: Level of anxiety will decrease Outcome: Progressing   Problem: Safety: Goal: Ability to remain free from injury will improve Outcome: Progressing   

## 2022-11-14 NOTE — Progress Notes (Signed)
Physical Therapy Treatment Patient Details Name: David Kim MRN: [id number] DOB: 1946/04/02 Today's Date: 11/14/2022   History of Present Illness Mr. Laminack is a 77 yo male with PMH paraplegia, severe MDD, persistent adjustment disorder with mixed anxiety and depressed mood ,PUD, gout, HTN, IBS, OSA, prostate cancer, GERD.   He was recently evaluated under IVC last month for expressing suicidal ideation. he was cleared for discharge home on 11/13.  Patient presented to [facility] long ED after home health aide discovered patient took an unknown amount of pills on 10/15/22    PT Comments    General Comments: AxO x 3 allways willing to get OOB.  Brother in [place] present but was quick to leave. General bed mobility comments: side to side rolling for peri care BM and application of brief.  assist for upper body and use of bed pad to complete scooting to EOB.  Much B UE support to prevent forward LOB. General transfer comment: using a long sliding board to scoot transfer from bed to pt's personal wheelchair at [place] Pt self able 50% with caution to guard/prevent forward LOB. Product manager mobility: Yes Wheelchair propulsion: Both upper extremities Distance: Clinical biochemist Details (indicate cue type and reason): NO VC's needed.  Pt fully capable to navigate around corners and perfom 180 turns. Left pt in wheelchair to eat lunch/watch TV. Pt plans to D/C to SNF for ST Rehab to regain his prior level of Indep/wheelchair level.    Recommendations for follow up therapy are one component of a multi-disciplinary discharge planning process, led by the attending physician.  Recommendations may be updated based on patient status, additional functional criteria and insurance authorization.  Follow Up Recommendations  Skilled nursing-short term rehab (<3 hours/day) Can patient physically be transported by private vehicle: No   Assistance Recommended at Discharge  Frequent or constant Supervision/Assistance  Patient can return home with the following A lot of help with walking and/or transfers;A lot of help with bathing/dressing/bathroom;Assist for transportation;Help with stairs or ramp for entrance   Equipment Recommendations  None recommended by PT    Recommendations for Other Services       Precautions / Restrictions Precautions Precautions: Fall Precaution Comments: paraplegia T12 , incontinence Restrictions Weight Bearing Restrictions: No Other Position/Activity Restrictions: L LE spastic/R LE flaccid     Mobility  Bed Mobility Overal bed mobility: Needs Assistance Bed Mobility: Supine to Sit     Supine to sit: Mod assist, Max assist     General bed mobility comments: side to side rolling for peri care BM and application of brief.  assist for upper body and use of bed pad to complete scooting to EOB.  Much B UE support to prevent forward LOB.    Transfers Overall transfer level: Needs assistance Equipment used: Sliding board Transfers: Bed to chair/wheelchair/BSC             General transfer comment: using a long sliding board to scoot transfer from bed to pt's personal wheelchair at Mod [place]st Pt self able 50% with caution to guard/prevent forward LOB.    Ambulation/Gait               General Gait Details: NON amb.  Pt uses a wheelchair for mobility.   Stairs             Information systems manager mobility: Yes Wheelchair propulsion: Both upper extremities Distance: Clinical biochemist Details (indicate cue type and reason): NO VC's  needed.  Pt fully capable to navigate around corners and perfom 180 turns.  Modified Rankin (Stroke Patients Only)       Balance                                            Cognition Arousal/Alertness: Awake/alert Behavior During Therapy: WFL for tasks assessed/performed Overall Cognitive Status: Within  Functional Limits for tasks assessed                                 General Comments: AxO x 3 allways willing to get OOB.  Brother in [place] present but was quick to leave.        Exercises      General Comments        Pertinent Vitals/Pain Pain Assessment Pain Assessment: No/denies pain    Home Living                          Prior Function            PT Goals (current goals can now be found in the care plan section) Progress towards PT goals: Progressing toward goals    Frequency    Min 2X/week      PT Plan Current plan remains appropriate    Co-evaluation              AM-PAC PT "6 Clicks" Mobility   Outcome Measure  Help needed turning from your back to your side while in a flat bed without using bedrails?: A Lot Help needed moving from lying on your back to sitting on the side of a flat bed without using bedrails?: A Lot Help needed moving to and from a bed to a chair (including a wheelchair)?: A Lot Help needed standing up from a chair using your arms (e.g., wheelchair or bedside chair)?: A Lot Help needed to walk in hospital room?: Total Help needed climbing 3-5 steps with a railing? : Total 6 Click Score: 10    End of Session Equipment Utilized During Treatment: Gait belt Activity Tolerance: Patient tolerated treatment well Patient left: with call bell/phone within reach;with nursing/sitter in room (wheelchair) Nurse Communication: Mobility status PT Visit Diagnosis: Other symptoms and signs involving the nervous system (R29.898)     Time: 7564-3329 PT Time Calculation (min) (ACUTE ONLY): 28 min  Charges:  $Therapeutic Activity: 8-22 mins $Wheel Chair Management: 8-22 mins                    Rica Koyanagi  PTA [place] [facility] M-F          [phone] Weekend pager [phone]

## 2022-11-14 NOTE — Progress Notes (Signed)
Physical Therapy Treatment Patient Details Name: David Kim MRN: [id number] DOB: 1946/02/17 Today's Date: 11/14/2022   History of Present Illness David Kim is a 77 yo male with PMH paraplegia, severe MDD, persistent adjustment disorder with mixed anxiety and depressed mood ,PUD, gout, HTN, IBS, OSA, prostate cancer, GERD.   He was recently evaluated under IVC last month for expressing suicidal ideation. he was cleared for discharge home on 11/13.  Patient presented to [facility] long ED after home health aide discovered patient took an unknown amount of pills on 10/15/22    PT Comments    General Comments: AxO x 3 allways willing.  Still in wheelchair had just finished lunch. Pt self propelled to bathroom to brush his teeth.  Then self propelled in hallway >500 feet in his personal wheelchair.  Then assisted with a sliding board transfer into recliner.  Positioned to comfort.   Pt will need ST Rehab at SNF to address his decline in functional mobility.    Recommendations for follow up therapy are one component of a multi-disciplinary discharge planning process, led by the attending physician.  Recommendations may be updated based on patient status, additional functional criteria and insurance authorization.  Follow Up Recommendations  Skilled nursing-short term rehab (<3 hours/day) Can patient physically be transported by private vehicle: No   Assistance Recommended at Discharge Frequent or constant Supervision/Assistance  Patient can return home with the following A lot of help with walking and/or transfers;A lot of help with bathing/dressing/bathroom;Assist for transportation;Help with stairs or ramp for entrance   Equipment Recommendations  None recommended by PT    Recommendations for Other Services       Precautions / Restrictions Precautions Precautions: Fall Precaution Comments: paraplegia T12 , incontinence Restrictions Weight Bearing Restrictions: No Other Position/Activity  Restrictions: L LE spastic/R LE flaccid     Mobility  Bed Mobility Overal bed mobility: Needs Assistance Bed Mobility: Supine to Sit     Supine to sit: Mod assist, Max assist     General bed mobility comments: OOB    Transfers Overall transfer level: Needs assistance Equipment used: Sliding board Transfers: Bed to chair/wheelchair/BSC            Lateral/Scoot Transfers: With slide board General transfer comment: using a long sliding board to scoot transfer from wheelchair to recliner at Mod Asisst Pt self able 50% with caution to guard/prevent forward LOB.    Ambulation/Gait               General Gait Details: NON amb.  Pt uses a wheelchair for mobility.   Stairs             Information systems manager mobility: Yes Wheelchair propulsion: Both upper extremities Distance: Clinical biochemist Details (indicate cue type and reason): NO VC's needed.  Pt fully capable to navigate around corners and perfom 180 turns.  Modified Rankin (Stroke Patients Only)       Balance                                            Cognition Arousal/Alertness: Awake/alert Behavior During Therapy: WFL for tasks assessed/performed Overall Cognitive Status: Within Functional Limits for tasks assessed  General Comments: AxO x 3 allways willing.  Still in wheelchair had just finished lunch.        Exercises      General Comments        Pertinent Vitals/Pain Pain Assessment Pain Assessment: No/denies pain    Home Living                          Prior Function            PT Goals (current goals can now be found in the care plan section) Progress towards PT goals: Progressing toward goals    Frequency    Min 2X/week      PT Plan Current plan remains appropriate    Co-evaluation              AM-PAC PT "6 Clicks" Mobility   Outcome  Measure  Help needed turning from your back to your side while in a flat bed without using bedrails?: A Lot Help needed moving from lying on your back to sitting on the side of a flat bed without using bedrails?: A Lot Help needed moving to and from a bed to a chair (including a wheelchair)?: A Lot Help needed standing up from a chair using your arms (e.g., wheelchair or bedside chair)?: Total Help needed to walk in hospital room?: Total Help needed climbing 3-5 steps with a railing? : Total 6 Click Score: 9    End of Session Equipment Utilized During Treatment: Gait belt Activity Tolerance: Patient tolerated treatment well Patient left: in chair;with call bell/phone within reach Nurse Communication: Mobility status PT Visit Diagnosis: Other symptoms and signs involving the nervous system (R29.898)     Time: 1246-1315 PT Time Calculation (min) (ACUTE ONLY): 29 min  Charges:  $Therapeutic Activity: 8-22 mins $Wheel Chair Management: 8-22 mins                     Rica Koyanagi  PTA [place] [facility] M-F          [phone] Weekend pager [phone]

## 2022-11-15 DIAGNOSIS — T50902S Poisoning by unspecified drugs, medicaments and biological substances, intentional self-harm, sequela: Secondary | ICD-10-CM | POA: Diagnosis not present

## 2022-11-15 DIAGNOSIS — A419 Sepsis, unspecified organism: Secondary | ICD-10-CM | POA: Diagnosis not present

## 2022-11-15 DIAGNOSIS — G928 Other toxic encephalopathy: Secondary | ICD-10-CM | POA: Diagnosis not present

## 2022-11-15 DIAGNOSIS — R7881 Bacteremia: Secondary | ICD-10-CM | POA: Diagnosis not present

## 2022-11-15 LAB — BASIC METABOLIC PANEL
Anion gap: 7 (ref 5–15)
BUN: 20 mg/dL (ref 8–23)
CO2: 24 mmol/L (ref 22–32)
Calcium: 8.6 mg/dL — ABNORMAL LOW (ref 8.9–10.3)
Chloride: 105 mmol/L (ref 98–111)
Creatinine, Ser: 0.64 mg/dL (ref 0.61–1.24)
GFR, Estimated: >60 mL/min (ref >60)
Glucose, Bld: 99 mg/dL (ref 70–99)
Potassium: 3.7 mmol/L (ref 3.5–5.1)
Sodium: 136 mmol/L (ref 135–145)

## 2022-11-15 LAB — MAGNESIUM: Magnesium: 2 mg/dL (ref 1.7–2.4)

## 2022-11-15 LAB — CBC
HCT: 42.2 % (ref 39.0–52.0)
Hemoglobin: 13.4 g/dL (ref 13.0–17.0)
MCH: 29.3 pg (ref 26.0–34.0)
MCHC: 31.8 g/dL (ref 30.0–36.0)
MCV: 92.3 fL (ref 80.0–100.0)
Platelets: 244 10*3/uL (ref 150–400)
RBC: 4.57 MIL/uL (ref 4.22–5.81)
RDW: 14.2 % (ref 11.5–15.5)
WBC: 8 10*3/uL (ref 4.0–10.5)
nRBC: 0 % (ref 0.0–0.2)

## 2022-11-15 NOTE — Progress Notes (Signed)
PROGRESS NOTE    David Kim  GMW:[id number] DOB: 1946/07/09 DOA: 10/15/2022 PCP: Biagio Borg, MD    Brief Narrative:  77 years old paraplegic, manic depressive disorder, persistent adjustment disorder with mixed anxiety and depression, peptic ulcer disease, gout, hypertension, irritable bowel syndrome, obstructive sleep apnea, prostate cancer, GERD who was recently placed in IVC last month for depression and suicidal ideation.  At that time he was cleared to go home with decreased Klonopin dose but again presented to hospital with unknown amount of pill ingestion including opened bottles of  losartan, tizanidine, oxybutynin, baclofen, and clonazepam.   Initial evaluation also revealed multiple SIRS criteria with fever of 103.2 and urinalysis suggestive of urinary tract infection prompting initiation of intravenous ceftriaxone.  Patient was initially hospitalized in the stepdown unit for suspected drug overdose with initial concern for sepsis secondary to urinary tract infection.  Urine culture was positive for pansensitive E. coli.  Patient also had left lower lobe infiltrate and received cefepime and vancomycin.  After several days of resolving suspected medication patient had agitation tremors and hallucinations concerning for benzodiazepine withdrawal and was put back on benzodiazepines from 12/13 until 12/19 patient's mentation gradually improved.  Extensive encephalopathy workup was undertaken including obtaining CT head, MRI brain and EEG all of which did not identify a contributing cause. Psychiatry was also consulted on admission and do not suggest inpatient psych admission or suicidality at this time.  Due to guarded prognosis, palliative care consultation was additionally obtained.     Assessment and Plan:  * Intentional overdose ([place]) Suspected intentional overdose, found to have open bottles of losartan, tizanidine, oxybutynin, baclofen and clonazepam by EMS.  Quantity was unclear.   Was resumed on benzodiazepines and baclofen since 12/13 due to withdrawals.  Currently stable.  Off one-to-one sitter.  No suicidal precautions are needed at this time.  Psychiatry followed the patient during hospitalization and recommend no inpatient psychiatry admission.  Sepsis from E. coli UTI. Patient also had pulmonary infiltrate.  Was initially on ceftriaxone and subsequently vancomycin was added has improved at this time.  Repeat blood cultures have been negative.  Improved and resolved at this time.  Acute hypoxemic respiratory failure (HCC)-resolved as of 10/16/2022 In the setting of overdose.  No signs of aspiration.  Currently on room air.  Toxic metabolic encephalopathy Patient had confusion, myoclonus hallucinations likely secondary to initial overdose followed by withdrawal.  Off cefepime at this time.  Has been resumed back on benzodiazepines and baclofen.  This time patient is more calm and cooperative.  Previous provider had spoken with neurology.  MRI of the brain was unremarkable.  TSH B12 within normal range.  EEG on 12/15 without obvious seizure activity although this was substantially obscured by ongoing benzodiazepine use.  Encephalopathy has improved.  Patient is alert awake and oriented.  GIB (gastrointestinal bleeding) Episode of bright red blood per rectum on 12/12 followed by additional small episode morning of 12/13.  Patient had received 2 units of packed RBC on 10/17/2022.  GI was consulted and impression was stercoral colitis with ulceration.  No further episodes at this time.  Latest hemoglobin at 13.4.  Bacteremia due to Escherichia coli Antibiotic course completed on 12/19.  Stercoral colitis history of severe constipation..  Patient had history of regular rectal stimulation and disimpaction in the past.  Continue aggressive regimen with MiraLAX, senna and Colace.  Patient likely has neurogenic bowel given his history of paraplegia.  Disimpaction as  needed.  Metabolic acidosis Secondary  to overdose.  No anion gap at this time.  Improved.  Severe recurrent major depression [facility]) Psychiatry followed the patient during hospitalization.  Patient has become severely depressed since his wife passing away in October. She was essentially the patient's sole caretaker and he was completely dependent on her.  Since her passing, he has began "shutting down".  Continue Haldol risperidone and Ativan.  Currently denies any suicidal ideation.  Pressure injury of sacral region, stage 2  Present on admission.  Continue wound care.  Essential hypertension Continue amlodipine 10 mg daily, as needed antihypertensive.  Latest blood pressure of 110/65.  Paraplegia [facility]) Fall precautions.  Continue pressure ulcer prevention protocol.  GERD Continue oral Protonix  Hypokalemia Latest potassium of 3.7 improved.  Goals of care, counseling/discussion No one has power of attorney.  Decision making for patient's care has been shared between the patient's son-in-law, daughter-in-law and niece by previous provider..  DNR.  Guarded prognosis overall.   DVT prophylaxis: enoxaparin (LOVENOX) injection 40 mg Start: 10/16/22 1000   Code Status:     Code Status: DNR  Disposition: Skilled nursing facility.  TOC on board.  Status is: Inpatient  Remains inpatient appropriate because: Awaiting  for disposition.  Appears medically stable for disposition.   Family Communication:   I spoke with Ms colleen on the phone and updated her on 11/13/2022.  Consultants:  Psychiatry  Procedures:  None  Antimicrobials:  None currently  Subjective: Today, patient was seen and examined at bedside.  Denies interval complaints.  Denies any nausea vomiting fever chills or rigor.  Objective: Vitals:   11/14/22 1310 11/14/22 2130 11/15/22 0514 11/15/22 1211  BP: 125/77 126/73 138/81 110/65  Pulse: (!) 105 83 72 (!) 101  Resp: '15 20 20 20  '$ Temp: 98.7 F (37.1 C) 97.9  F (36.6 C) 97.8 F (36.6 C) 98.2 F (36.8 C)  TempSrc: Oral Oral Oral Oral  SpO2: 98% 98% 98%   Weight:      Height:        Intake/Output Summary (Last 24 hours) at 11/15/2022 1350 Last data filed at 11/15/2022 0845 Gross per 24 hour  Intake 600 ml  Output 1150 ml  Net -550 ml    Filed Weights   10/15/22 2347 10/23/22 0000 11/13/22 0257  Weight: 82.2 kg 85.7 kg 76.6 kg    Physical Examination: Body mass index is 25.68 kg/m.   General: Alert awake and Communicative.  Not in obvious distress, average built. HENT:   No scleral pallor or icterus noted. Oral mucosa is moist.  Chest: Clear breath sounds bilaterally.   CVS: S1 &S2 heard. No murmur.  Regular rate and rhythm. Abdomen: Soft, nontender, nondistended.  Bowel sounds are heard.   Extremities: No cyanosis, clubbing, paraparesis Psych: Alert, awake and oriented, Communicative. CNS:  No cranial nerve deficits.  Paraplegia noted. Skin: Warm and dry.  Pressure injury over the sacral area.  Data Reviewed:   CBC: Recent Labs  Lab 11/11/22 0531 11/15/22 0519  WBC 9.9 8.0  HGB 14.1 13.4  HCT 44.1 42.2  MCV 91.5 92.3  PLT 231 244     Basic Metabolic Panel: Recent Labs  Lab 11/11/22 0531 11/15/22 0519  NA 137 136  K 3.7 3.7  CL 106 105  CO2 24 24  GLUCOSE 114* 99  BUN 23 20  CREATININE 0.70 0.64  CALCIUM 8.7* 8.6*  MG  --  2.0     Liver Function Tests: No results for input(s): "AST", "ALT", "ALKPHOS", "BILITOT", "  PROT", "ALBUMIN" in the last 168 hours.   Radiology Studies: No results found.    LOS: 31 days    David Lipps, MD [facility] Available via Epic secure chat 7am-7pm After these hours, please refer to coverage provider listed on [email or web address] 11/15/2022, 1:50 PM

## 2022-11-15 NOTE — Progress Notes (Signed)
Orthopedic Tech Progress Note Patient Details:  David Kim 1945/12/20 [id number]  Patient ID: David Kim, male   DOB: November 17, 1945, 77 y.o.   MRN: [id number]  David Kim 11/15/2022, 12:19 PM Overhead trapeze bar installed

## 2022-11-15 NOTE — Progress Notes (Signed)
Physical Therapy Treatment Patient Details Name: David Kim MRN: [id number] DOB: 30-Mar-1946 Today's Date: 11/15/2022   History of Present Illness [name] is a 77 yo male with PMH paraplegia, severe MDD, persistent adjustment disorder with mixed anxiety and depressed mood ,PUD, gout, HTN, IBS, OSA, prostate cancer, GERD.   He was recently evaluated under IVC last month for expressing suicidal ideation. he was cleared for discharge home on 11/13.  Patient presented to [facility] long ED after home health aide discovered patient took an unknown amount of pills on 10/15/22    PT Comments    Pt AxO x 3 always willing to participate.  Co Tx with OT today for a shower. Assisted OOB to wc via sliding board then sliding board trans to shower seat.  Then sliding board from shower seat back to wheelchair.  Pt able to self slide @ 50% with Max Assist for set up.    Recommendations for follow up therapy are one component of a multi-disciplinary discharge planning process, led by the attending physician.  Recommendations may be updated based on patient status, additional functional criteria and insurance authorization.  Follow Up Recommendations  Skilled nursing-short term rehab (<3 hours/day) Can patient physically be transported by private vehicle: No   Assistance Recommended at Discharge Frequent or constant Supervision/Assistance  Patient can return home with the following A lot of help with walking and/or transfers;A lot of help with bathing/dressing/bathroom;Assist for transportation;Help with stairs or ramp for entrance   Equipment Recommendations  None recommended by PT    Recommendations for Other Services       Precautions / Restrictions Precautions Precautions: Fall Precaution Comments: paraplegia T12 , incontinence Restrictions Weight Bearing Restrictions: No Other Position/Activity Restrictions: L LE spastic/R LE flaccid     Mobility  Bed Mobility Overal bed mobility: Needs  Assistance       Supine to sit: Mod assist, Max assist     General bed mobility comments: assist with upper body and scooting to EOB    Transfers Overall transfer level: Needs assistance Equipment used: Sliding board              Lateral/Scoot Transfers: With slide board General transfer comment: using a long sliding board to transfer from EOB to wheelchair then again from wheelchair to shower seat.  Pt self able at 50% scooting and Max Assist for set up.  Caution to prevent forward LOB.  Assisted with a third sliding board transfer from shoer seat to wheelchair.    Ambulation/Gait               General Gait Details: NON amb.  Pt uses a wheelchair for mobility.   Stairs             Wheelchair Mobility    Modified Rankin (Stroke Patients Only)       Balance                                            Cognition Arousal/Alertness: Awake/alert Behavior During Therapy: WFL for tasks assessed/performed Overall Cognitive Status: Within Functional Limits for tasks assessed                                 General Comments: AxO x 3 allways willing.  Special treat today.  Co Tx for pt to  get a shower.        Exercises      General Comments        Pertinent Vitals/Pain Pain Assessment Pain Assessment: No/denies pain    Home Living                          Prior Function            PT Goals (current goals can now be found in the care plan section) Progress towards PT goals: Progressing toward goals    Frequency    Min 2X/week      PT Plan Current plan remains appropriate    Co-evaluation     PT goals addressed during session: Mobility/safety with mobility OT goals addressed during session: ADL's and self-care      AM-PAC PT "6 Clicks" Mobility   Outcome Measure  Help needed turning from your back to your side while in a flat bed without using bedrails?: A Lot Help needed moving from  lying on your back to sitting on the side of a flat bed without using bedrails?: A Lot   Help needed standing up from a chair using your arms (e.g., wheelchair or bedside chair)?: Total Help needed to walk in hospital room?: Total Help needed climbing 3-5 steps with a railing? : Total 6 Click Score: 7    End of Session Equipment Utilized During Treatment: Gait belt Activity Tolerance: Patient tolerated treatment well Patient left: in chair;with call bell/phone within reach (wheelchair)   PT Visit Diagnosis: Other symptoms and signs involving the nervous system (R29.898)     Time: 1057-1130 PT Time Calculation (min) (ACUTE ONLY): 33 min  Charges:  $Therapeutic Activity: 23-37 mins                     Rica Koyanagi  PTA Acute  Rehabilitation Services Office M-F          [phone] Weekend pager [phone]

## 2022-11-15 NOTE — Plan of Care (Signed)
  Problem: Health Behavior/Discharge Planning: Goal: Ability to manage health-related needs will improve Outcome: Progressing   Problem: Clinical Measurements: Goal: Ability to maintain clinical measurements within normal limits will improve Outcome: Progressing Goal: Will remain free from infection Outcome: Progressing Goal: Diagnostic test results will improve Outcome: Progressing Goal: Respiratory complications will improve Outcome: Progressing Goal: Cardiovascular complication will be avoided Outcome: Progressing   Problem: Activity: Goal: Risk for activity intolerance will decrease Outcome: Progressing   Problem: Nutrition: Goal: Adequate nutrition will be maintained Outcome: Progressing   Problem: Coping: Goal: Level of anxiety will decrease Outcome: Progressing   Problem: Elimination: Goal: Will not experience complications related to bowel motility Outcome: Progressing Goal: Will not experience complications related to urinary retention Outcome: Progressing   Problem: Pain Managment: Goal: General experience of comfort will improve Outcome: Progressing   Problem: Safety: Goal: Ability to remain free from injury will improve Outcome: Progressing   Problem: Skin Integrity: Goal: Risk for impaired skin integrity will decrease Outcome: Progressing   Problem: Education: Goal: Knowledge of General Education information will improve Description: Including pain rating scale, medication(s)/side effects and non-pharmacologic comfort measures Outcome: Progressing   Problem: Health Behavior/Discharge Planning: Goal: Ability to manage health-related needs will improve Outcome: Progressing   Problem: Clinical Measurements: Goal: Ability to maintain clinical measurements within normal limits will improve Outcome: Progressing Goal: Will remain free from infection Outcome: Progressing Goal: Diagnostic test results will improve Outcome: Progressing Goal:  Respiratory complications will improve Outcome: Progressing Goal: Cardiovascular complication will be avoided Outcome: Progressing   Problem: Activity: Goal: Risk for activity intolerance will decrease Outcome: Progressing   Problem: Nutrition: Goal: Adequate nutrition will be maintained Outcome: Progressing   Problem: Coping: Goal: Level of anxiety will decrease Outcome: Progressing   Problem: Elimination: Goal: Will not experience complications related to bowel motility Outcome: Progressing Goal: Will not experience complications related to urinary retention Outcome: Progressing   Problem: Pain Managment: Goal: General experience of comfort will improve Outcome: Progressing   Problem: Safety: Goal: Ability to remain free from injury will improve Outcome: Progressing   Problem: Skin Integrity: Goal: Risk for impaired skin integrity will decrease Outcome: Progressing   

## 2022-11-15 NOTE — Progress Notes (Signed)
Occupational Therapy Treatment Patient Details Name: David Kim MRN: [id number] DOB: 1946-07-06 Today's Date: 11/15/2022   History of present illness David Kim is a 77 yo male with PMH paraplegia, severe MDD, persistent adjustment disorder with mixed anxiety and depressed mood ,PUD, gout, HTN, IBS, OSA, prostate cancer, GERD.   He was recently evaluated under IVC last month for expressing suicidal ideation. he was cleared for discharge home on 11/13.  Patient presented to [facility] long ED after home health aide discovered patient took an unknown amount of pills on 10/15/22   OT comments  Co-treat with PT for shower transfers and showering task. Patient required use of slide board for transfer from wc to built in shower seat. Patient required hold on grab bar at all times to maintain balance. Patient able to to perform UB bathing with setup and max assist for LB bathing. Patient still required assistance to don hospital gown seated on shower seat but suspect he could don a typical shirt seated in his wc. Will focus interventions aimed at compensatory strategies at wc level or bed level going forward.    Recommendations for follow up therapy are one component of a multi-disciplinary discharge planning process, led by the attending physician.  Recommendations may be updated based on patient status, additional functional criteria and insurance authorization.    Follow Up Recommendations  Skilled nursing-short term rehab (<3 hours/day)     Assistance Recommended at Discharge Frequent or constant Supervision/Assistance  Patient can return home with the following  A lot of help with walking and/or transfers;A lot of help with bathing/dressing/bathroom;Assistance with cooking/housework;Direct supervision/assist for medications management;Direct supervision/assist for financial management;Assist for transportation;Help with stairs or ramp for entrance   Equipment Recommendations  None recommended by OT     Recommendations for Other Services      Precautions / Restrictions Precautions Precautions: Fall Precaution Comments: paraplegia T12 , incontinence Restrictions Weight Bearing Restrictions: No Other Position/Activity Restrictions: L LE spastic/R LE flaccid       Mobility Bed Mobility                    Transfers                         Balance   Sitting-balance support: Single extremity supported, Feet unsupported Sitting balance-Leahy Scale: Poor                                     ADL either performed or assessed with clinical judgement   ADL Overall ADL's : Needs assistance/impaired         Upper Body Bathing: Set up;Sitting Upper Body Bathing Details (indicate cue type and reason): Patient transferred into shower. Patient able to perform UB bathing with setup with one hand holding onto grab bar at all times. Lower Body Bathing: Maximal assistance;Sitting/lateral leans Lower Body Bathing Details (indicate cue type and reason): Max assist for LB bathing in shower. Patient requires one hand to stabilize himself at all times. He could wash upper thighs and groin area. Needed assistance for buttocks and lower legs. Upper Body Dressing : Sitting;Moderate assistance Upper Body Dressing Details (indicate cue type and reason): to don gown. Lower Body Dressing: Sitting/lateral leans;Maximal assistance Lower Body Dressing Details (indicate cue type and reason): to don socks         Tub/ Shower Transfer: Teacher, adult education;Shower Scientist, research (medical)  Details (indicate cue type and reason): Min assist for transfer onto built in shower seat but mod assist to return to wc Functional mobility during ADLs: Minimal assistance;Moderate assistance General ADL Comments: Requires use of slide board for transfer into shower and out of shower.    Extremity/Trunk Assessment              Vision       Perception     Praxis      Cognition  Arousal/Alertness: Awake/alert Behavior During Therapy: WFL for tasks assessed/performed Overall Cognitive Status: Within Functional Limits for tasks assessed                                          Exercises      Shoulder Instructions       General Comments      Pertinent Vitals/ Pain       Pain Assessment Pain Assessment: No/denies pain  Home Living                                          Prior Functioning/Environment              Frequency  Min 2X/week        Progress Toward Goals  OT Goals(current goals can now be found in the care plan section)  Progress towards OT goals: Progressing toward goals  Acute Rehab OT Goals OT Goal Formulation: With patient Time For Goal Achievement: 11/29/22 Potential to Achieve Goals: David Kim Discharge plan remains appropriate    Co-evaluation    PT/OT/SLP Co-Evaluation/Treatment: Yes   PT goals addressed during session: Mobility/safety with mobility OT goals addressed during session: ADL's and self-care      AM-PAC OT "6 Clicks" Daily Activity     Outcome Measure   Help from another person eating meals?: None Help from another person taking care of personal grooming?: A Little Help from another person toileting, which includes using toliet, bedpan, or urinal?: A Lot Help from another person bathing (including washing, rinsing, drying)?: A Lot Help from another person to put on and taking off regular upper body clothing?: A Little Help from another person to put on and taking off regular lower body clothing?: A Lot 6 Click Score: 16    End of Session    OT Visit Diagnosis: Muscle weakness (generalized) (M62.81)   Activity Tolerance Patient tolerated treatment well   Patient Left in chair;with call bell/phone within reach   Nurse Communication Mobility status        Time: 1110-1135 OT Time Calculation (min): 25 min  Charges: OT General Charges $OT Visit: 1  Visit OT Treatments $Self Care/Home Management : 8-22 mins  Gustavo Lah, OTR/L Acute Care Rehab Services  Office [phone]   David Kim 11/15/2022, 2:23 PM

## 2022-11-15 NOTE — TOC Progression Note (Addendum)
Transition of Care [facility]) - Progression Note    Patient Details  Name: David Kim MRN: [id number] Date of Birth: [birth year]  Transition of Care [facility]) CM/SW Contact  Finch Costanzo, Juliann Pulse, RN Phone Number: 11/15/2022, 11:17 AM  Clinical Narrative:Patient evaluated by Pas[facility] rep today await pasrr for ST SNF @ [facility]-accepted.Patient has w/c family([name]) informed to take w/c since [facility] will not be able to transport @ d/c.     -2:57p-[facility] rep Kia onsite review of patient today-await outcome.   Expected Discharge Plan: [place] Barriers to Discharge: Continued Medical Work up  Expected Discharge Plan and Services   Discharge Planning Services: CM Consult   Living arrangements for the past 2 months: Single Family Home                                       Social Determinants of Health (SDOH) Interventions SDOH Screenings   Food Insecurity: No Food Insecurity (10/20/2022)  Housing: Low Risk  (10/20/2022)  Transportation Needs: No Transportation Needs (10/20/2022)  Utilities: Not At Risk (10/20/2022)  Alcohol Screen: Low Risk  (07/24/2022)  Depression (PHQ2-9): Low Risk  (04/05/2022)  Financial Resource Strain: Low Risk  (07/24/2022)  Physical Activity: Inactive (07/24/2022)  Social Connections: Moderately Isolated (07/24/2022)  Stress: No Stress Concern Present (07/24/2022)  Tobacco Use: Low Risk  (10/20/2022)    Readmission Risk Interventions    10/27/2022   10:19 AM  Readmission Risk Prevention Plan  Transportation Screening Complete  PCP or Specialist Appt within 5-7 Days Complete  Home Care Screening Complete  Medication Review (RN CM) Complete

## 2022-11-15 NOTE — Progress Notes (Signed)
Physical Therapy Treatment Patient Details Name: David Kim MRN: [id number] DOB: [birth year] Today's Date: 11/15/2022   History of Present Illness Mr. Fuster is a 77 yo male with PMH paraplegia, severe MDD, persistent adjustment disorder with mixed anxiety and depressed mood ,PUD, gout, HTN, IBS, OSA, prostate cancer, GERD.   He was recently evaluated under IVC last month for expressing suicidal ideation. he was cleared for discharge home on 11/13.  Patient presented to [facility] long ED after home health aide discovered patient took an unknown amount of pills on 10/15/22    PT Comments    General transfer comment: with RN and NT in room, demonstarted and educated on proper tech using long sliding board while assisted pt from his personal wheelchair to bed.  Recommendations for follow up therapy are one component of a multi-disciplinary discharge planning process, led by the attending physician.  Recommendations may be updated based on patient status, additional functional criteria and insurance authorization.  Follow Up Recommendations  Skilled nursing-short term rehab (<3 hours/day) Can patient physically be transported by private vehicle: No   Assistance Recommended at Discharge Frequent or constant Supervision/Assistance  Patient can return home with the following A lot of help with walking and/or transfers;A lot of help with bathing/dressing/bathroom;Assist for transportation;Help with stairs or ramp for entrance   Equipment Recommendations  None recommended by PT    Recommendations for Other Services       Precautions / Restrictions Precautions Precautions: Fall Precaution Comments: paraplegia T12 , incontinence Restrictions Weight Bearing Restrictions: No Other Position/Activity Restrictions: L LE spastic/R LE flaccid     Mobility  Bed Mobility Overal bed mobility: Needs Assistance Bed Mobility: Rolling, Sit to Supine Rolling: Min assist, Mod assist   Supine to sit: Mod  assist, Max assist Sit to supine: Max assist   General bed mobility comments: assist back to bed sit to supine and side to side rolling for peri care.    Transfers Overall transfer level: Needs assistance Equipment used: Sliding board Transfers: Bed to chair/wheelchair/BSC            Lateral/Scoot Transfers: With slide board General transfer comment: with RN and NT in room, demonstarted and educated on proper tech using long sliding board while assisted pt from his personal wheelchair to bed.    Ambulation/Gait               General Gait Details: NON amb.  Pt uses a wheelchair for mobility.   Stairs             Wheelchair Mobility    Modified Rankin (Stroke Patients Only)       Balance                                            Cognition Arousal/Alertness: Awake/alert Behavior During Therapy: WFL for tasks assessed/performed Overall Cognitive Status: Within Functional Limits for tasks assessed                                 General Comments: AxO x 3 allways willing.  Special treat today.  Co Tx for pt to get a shower.        Exercises      General Comments        Pertinent Vitals/Pain Pain Assessment Pain Assessment: No/denies pain  Home Living                          Prior Function            PT Goals (current goals can now be found in the care plan section) Progress towards PT goals: Progressing toward goals    Frequency    Min 2X/week      PT Plan Current plan remains appropriate    Co-evaluation PT/OT/SLP Co-Evaluation/Treatment: Yes   PT goals addressed during session: Mobility/safety with mobility OT goals addressed during session: ADL's and self-care      AM-PAC PT "6 Clicks" Mobility   Outcome Measure  Help needed turning from your back to your side while in a flat bed without using bedrails?: A Lot Help needed moving from lying on your back to sitting on the  side of a flat bed without using bedrails?: A Lot Help needed moving to and from a bed to a chair (including a wheelchair)?: A Lot Help needed standing up from a chair using your arms (e.g., wheelchair or bedside chair)?: Total Help needed to walk in hospital room?: Total Help needed climbing 3-5 steps with a railing? : Total 6 Click Score: 9    End of Session Equipment Utilized During Treatment: Gait belt Activity Tolerance: Patient tolerated treatment well Patient left: in bed;with bed alarm set;with call bell/phone within reach;with nursing/sitter in room Nurse Communication: Mobility status PT Visit Diagnosis: Other symptoms and signs involving the nervous system (R29.898)     Time: 3295-1884 PT Time Calculation (min) (ACUTE ONLY): 13 min  Charges:  $Therapeutic Activity: 8-22 mins                     Rica Koyanagi  PTA [place] Office M-F          [phone] Weekend pager [phone]

## 2022-11-16 DIAGNOSIS — R7881 Bacteremia: Secondary | ICD-10-CM | POA: Diagnosis not present

## 2022-11-16 DIAGNOSIS — A419 Sepsis, unspecified organism: Secondary | ICD-10-CM | POA: Diagnosis not present

## 2022-11-16 DIAGNOSIS — G928 Other toxic encephalopathy: Secondary | ICD-10-CM | POA: Diagnosis not present

## 2022-11-16 DIAGNOSIS — T50902S Poisoning by unspecified drugs, medicaments and biological substances, intentional self-harm, sequela: Secondary | ICD-10-CM | POA: Diagnosis not present

## 2022-11-16 NOTE — Care Management Important Message (Signed)
Important Message  Patient Details  Name: David Kim MRN: [id number] Date of Birth: 10-25-1946   Medicare Important Message Given:  Yes     Memory Argue 11/16/2022, 10:00 AM

## 2022-11-16 NOTE — Progress Notes (Signed)
Physical Therapy Treatment Patient Details Name: David Kim MRN: [id number] DOB: 05-Sep-1946 Today's Date: 11/16/2022   History of Present Illness Mr. Mcwhirter is a 77 yo male with PMH paraplegia, severe MDD, persistent adjustment disorder with mixed anxiety and depressed mood ,PUD, gout, HTN, IBS, OSA, prostate cancer, GERD.   He was recently evaluated under IVC last month for expressing suicidal ideation. he was cleared for discharge home on 11/13.  Patient presented to [facility] long ED after home health aide discovered patient took an unknown amount of pills on 10/15/22    PT Comments    General Comments: AxO x 3 always willing and eager to go to Rehab. Assisted OOB to wheelchair went well.  General bed mobility comments: increased self ability to use rail to self position to EOB. General transfer comment: assisted from bed to wheelchair via sliding board lateral scooting at [place] for set up and Mod/Min Asisst to scoot with caution to prevent forward LOB. General Gait Details: NON amb.  Pt uses a wheelchair for mobility. Wheelchair Mobility Wheelchair mobility: Yes Wheelchair propulsion: Both upper extremities Distance: > 550 feet Wheelchair Assistance Details (indicate cue type and reason): Mod Indep.  NO VC's needed.  Pt fully capable to navigate around corners and perfom 180 turns plus lock and unlock brakes. Pt will need ST Rehab at SNF to address mobility and functional decline.   Recommendations for follow up therapy are one component of a multi-disciplinary discharge planning process, led by the attending physician.  Recommendations may be updated based on patient status, additional functional criteria and insurance authorization.  Follow Up Recommendations  Skilled nursing-short term rehab (<3 hours/day) Can patient physically be transported by private vehicle: No   Assistance Recommended at Discharge Frequent or constant Supervision/Assistance  Patient can return home with the  following A lot of help with walking and/or transfers;A lot of help with bathing/dressing/bathroom;Assist for transportation;Help with stairs or ramp for entrance   Equipment Recommendations  None recommended by PT    Recommendations for Other Services       Precautions / Restrictions Precautions Precautions: Fall Precaution Comments: paraplegia T12 , incontinence Restrictions Weight Bearing Restrictions: No Other Position/Activity Restrictions: L LE spastic/R LE flaccid     Mobility  Bed Mobility Overal bed mobility: Needs Assistance Bed Mobility: Rolling, Sit to Supine Rolling: Min assist, Mod assist     Sit to supine: Mod assist   General bed mobility comments: increased self ability to use rail to self position to EOB.    Transfers Overall transfer level: Needs assistance Equipment used: Sliding board Transfers: Bed to chair/wheelchair/BSC            Lateral/Scoot Transfers: With slide board General transfer comment: assisted from bed to wheelchair via sliding board lateral scooting at [place] for set up and Mod/Min Asisst to scoot with caution to prevent forward LOB.    Ambulation/Gait               General Gait Details: NON amb.  Pt uses a wheelchair for mobility.   Stairs             Information systems manager mobility: Yes Wheelchair propulsion: Both upper extremities Distance: > 550 feet Wheelchair Assistance Details (indicate cue type and reason): Mod Indep.  NO VC's needed.  Pt fully capable to navigate around corners and perfom 180 turns plus lock and unlock brakes.  Modified Rankin (Stroke Patients Only)       Balance  Cognition Arousal/Alertness: Awake/alert Behavior During Therapy: WFL for tasks assessed/performed Overall Cognitive Status: Within Functional Limits for tasks assessed                                 General  Comments: AxO x 3 always willing and eager to go to Rehab.        Exercises      General Comments        Pertinent Vitals/Pain Pain Assessment Pain Assessment: Faces Faces Pain Scale: Hurts a little bit Pain Location: back Pain Descriptors / Indicators: Aching, Discomfort Pain Intervention(s): Monitored during session, Repositioned    Home Living                          Prior Function            PT Goals (current goals can now be found in the care plan section) Progress towards PT goals: Progressing toward goals    Frequency    Min 2X/week      PT Plan Current plan remains appropriate    Co-evaluation              AM-PAC PT "6 Clicks" Mobility   Outcome Measure  Help needed turning from your back to your side while in a flat bed without using bedrails?: A Lot Help needed moving from lying on your back to sitting on the side of a flat bed without using bedrails?: A Lot Help needed moving to and from a bed to a chair (including a wheelchair)?: A Lot Help needed standing up from a chair using your arms (e.g., wheelchair or bedside chair)?: Total Help needed to walk in hospital room?: Total Help needed climbing 3-5 steps with a railing? : Total 6 Click Score: 9    End of Session Equipment Utilized During Treatment: Gait belt Activity Tolerance: Patient tolerated treatment well Patient left: in chair (in wheelchair) Nurse Communication: Mobility status PT Visit Diagnosis: Other symptoms and signs involving the nervous system (R29.898)     Time: 1050-1116 PT Time Calculation (min) (ACUTE ONLY): 26 min  Charges:  $Therapeutic Activity: 8-22 mins $Wheel Chair Management: 8-22 mins                     {Kinzi Frediani  PTA Acute  [employer] M-F          [phone] Weekend pager [phone]

## 2022-11-16 NOTE — Progress Notes (Signed)
PROGRESS NOTE    David Kim  WCB:[id number] DOB: [birth year] DOA: 10/15/2022 PCP: Biagio Borg, MD    Brief Narrative:  77 years old paraplegic, manic depressive disorder, persistent adjustment disorder with mixed anxiety and depression, peptic ulcer disease, gout, hypertension, irritable bowel syndrome, obstructive sleep apnea, prostate cancer, GERD who was recently placed in IVC last month for depression and suicidal ideation.  At that time he was cleared to go home with decreased Klonopin dose but again presented to hospital with unknown amount of pill ingestion including opened bottles of  losartan, tizanidine, oxybutynin, baclofen, and clonazepam.   Initial evaluation also revealed multiple SIRS criteria with fever of 103.2 and urinalysis suggestive of urinary tract infection prompting initiation of intravenous ceftriaxone.  Patient was initially hospitalized in the stepdown unit for suspected drug overdose with initial concern for sepsis secondary to urinary tract infection.  Urine culture was positive for pansensitive E. coli.  Patient also had left lower lobe infiltrate and received cefepime and vancomycin.  After several days of resolving suspected medication patient had agitation tremors and hallucinations concerning for benzodiazepine withdrawal and was put back on benzodiazepines from 12/13 until 12/19 patient's mentation gradually improved.  Extensive encephalopathy workup was undertaken including obtaining CT head, MRI brain and EEG all of which did not identify a contributing cause. Psychiatry was also consulted on admission and do not suggest inpatient psych admission or suicidality at this time.  Due to guarded prognosis, palliative care consultation was additionally obtained.     Assessment and Plan:  * Intentional overdose ([place]) Suspected intentional overdose, found to have open bottles of losartan, tizanidine, oxybutynin, baclofen and clonazepam by EMS.  Quantity was unclear.   Was resumed on benzodiazepines and baclofen since 12/13 due to withdrawals.  Currently stable.  Off one-to-one sitter.  No suicidal precautions are needed at this time.  Psychiatry followed the patient during hospitalization and recommend no inpatient psychiatry admission.  At this time, he is awaiting for disposition to skilled nursing facility.  Sepsis from E. coli UTI. Resolved at this time.  Acute hypoxemic respiratory failure (HCC)-resolved as of 10/16/2022 In the setting of overdose.  Resolved at this time.  Patient is on room air  Toxic metabolic encephalopathy Resolved at this time.  Patient had confusion, myoclonus hallucinations likely secondary to initial overdose followed by withdrawal. .  Has been resumed back on benzodiazepines and baclofen. MRI of the brain was unremarkable.  TSH B12 within normal range.  EEG on 12/15 without obvious seizure activity although this was substantially obscured by ongoing benzodiazepine use.  Was seen by neurology during hospitalization.  GIB (gastrointestinal bleeding) Episode of bright red blood per rectum on 12/12 followed by additional small episode morning of 12/13.  Patient had received 2 units of packed RBC on 10/17/2022.  GI was consulted and impression was stercoral colitis with ulceration.   Latest hemoglobin at 13.4.  Bacteremia due to Escherichia coli Antibiotic course completed on 12/19.  Stercoral colitis history of severe constipation..  Patient had history of regular rectal stimulation and disimpaction in the past.  Continue aggressive regimen with MiraLAX, senna and Colace.  Patient likely has neurogenic bowel given his history of paraplegia.  Disimpaction as needed.  Metabolic acidosis Secondary to overdose.  Resolved  Severe recurrent major depression [facility]) Psychiatry followed the patient during hospitalization.  Patient has become severely depressed since his wife passing away in October. She was essentially the patient's sole  caretaker and he was completely dependent on her.  Since her passing, he has began "shutting down".  Continue Haldol risperidone and Ativan.  Currently denies any suicidal ideation.  Pressure injury of sacral region, stage 2  Present on admission.  Continue wound care.  Essential hypertension Continue amlodipine 10 mg daily  Paraplegia ([place]) Fall precautions.  Continue pressure ulcer prevention protocol.  GERD Continue oral Protonix  Hypokalemia Latest potassium of 3.7 improved.  Goals of care, counseling/discussion   Decision making for patient's care has been shared between the patient's son-in-law, daughter-in-law and niece by previous provider..  DNR.     DVT prophylaxis: enoxaparin (LOVENOX) injection 40 mg Start: 10/16/22 1000   Code Status:     Code Status: DNR  Disposition: Skilled nursing facility when bed is available. TOC on board.  Status is: Inpatient  Remains inpatient appropriate because: Awaiting  for disposition.  Appears medically stable for disposition.   Family Communication:   I spoke with Ms colleen on the phone and updated her on 11/13/2022.  Consultants:  Psychiatry  Procedures:  None  Antimicrobials:  None currently  Subjective: Today, patient was seen and examined at bedside.  Denies interval complaints.  Denies any nausea vomiting fever chills or rigor.    Objective: Vitals:   11/15/22 0514 11/15/22 1211 11/15/22 2147 11/16/22 0543  BP: 138/81 110/65 110/68 114/71  Pulse: 72 (!) 101 83 83  Resp: '20 20 18 20  '$ Temp: 97.8 F (36.6 C) 98.2 F (36.8 C) 97.6 F (36.4 C) 98 F (36.7 C)  TempSrc: Oral Oral Oral Oral  SpO2: 98%  97% 99%  Weight:      Height:        Intake/Output Summary (Last 24 hours) at 11/16/2022 1045 Last data filed at 11/16/2022 0900 Gross per 24 hour  Intake 600 ml  Output 1400 ml  Net -800 ml    Filed Weights   10/15/22 2347 10/23/22 0000 11/13/22 0257  Weight: 82.2 kg 85.7 kg 76.6 kg    Physical  Examination: Body mass index is 25.68 kg/m.   General:  Average built, not in obvious distress, alert awake and Communicative. HENT:   No scleral pallor or icterus noted. Oral mucosa is moist.  Chest:  Clear breath sounds.  No crackles or wheezes.  CVS: S1 &S2 heard. No murmur.  Regular rate and rhythm. Abdomen: Soft, nontender, nondistended.  Bowel sounds are heard.   Extremities: No cyanosis, clubbing or edema.  Paraplegia noted. Psych: Alert, awake and oriented, normal mood CNS:  No cranial nerve deficits.  Paraplegia. Skin: Warm and dry.  Pressure injury over the sacral area  Data Reviewed:   CBC: Recent Labs  Lab 11/11/22 0531 11/15/22 0519  WBC 9.9 8.0  HGB 14.1 13.4  HCT 44.1 42.2  MCV 91.5 92.3  PLT 231 244     Basic Metabolic Panel: Recent Labs  Lab 11/11/22 0531 11/15/22 0519  NA 137 136  K 3.7 3.7  CL 106 105  CO2 24 24  GLUCOSE 114* 99  BUN 23 20  CREATININE 0.70 0.64  CALCIUM 8.7* 8.6*  MG  --  2.0     Liver Function Tests: No results for input(s): "AST", "ALT", "ALKPHOS", "BILITOT", "PROT", "ALBUMIN" in the last 168 hours.   Radiology Studies: No results found.    LOS: 32 days    Flora Lipps, MD [facility] Available via Epic secure chat 7am-7pm After these hours, please refer to coverage provider listed on [email or web address] 11/16/2022, 10:45 AM

## 2022-11-16 NOTE — Progress Notes (Signed)
Physical Therapy Treatment Patient Details Name: David Kim MRN: [id number] DOB: [birth year] Today's Date: 11/16/2022   History of Present Illness David Kim is a 77 yo male with PMH paraplegia, severe MDD, persistent adjustment disorder with mixed anxiety and depressed mood ,PUD, gout, HTN, IBS, OSA, prostate cancer, GERD.   He was recently evaluated under IVC last month for expressing suicidal ideation. he was cleared for discharge home on 11/13.  Patient presented to [facility] long ED after home health aide discovered patient took an unknown amount of pills on 10/15/22    PT Comments    Pt self propelled his wheelchair a second time today > 550 feet using B UE's.  Assisted with a sliding board transfer from wheelchair to drop arm recliner.  Positioned in recliner to comfort.    Recommendations for follow up therapy are one component of a multi-disciplinary discharge planning process, led by the attending physician.  Recommendations may be updated based on patient status, additional functional criteria and insurance authorization.  Follow Up Recommendations  Skilled nursing-short term rehab (<3 hours/day) Can patient physically be transported by private vehicle: No   Assistance Recommended at Discharge Frequent or constant Supervision/Assistance  Patient can return home with the following A lot of help with walking and/or transfers;A lot of help with bathing/dressing/bathroom;Assist for transportation;Help with stairs or ramp for entrance   Equipment Recommendations  None recommended by PT    Recommendations for Other Services       Precautions / Restrictions Precautions Precautions: Fall Precaution Comments: paraplegia T12 , incontinence Restrictions Weight Bearing Restrictions: No Other Position/Activity Restrictions: L LE spastic/R LE flaccid     Mobility  Bed Mobility Overal bed mobility: Needs Assistance Bed Mobility: Rolling, Sit to Supine Rolling: Min assist, Mod  assist     Sit to supine: Mod assist   General bed mobility comments: pt OOB in wheelchair    Transfers Overall transfer level: Needs assistance Equipment used: Sliding board Transfers: Bed to chair/wheelchair/BSC            Lateral/Scoot Transfers: With slide board General transfer comment: assisted with lateral scoot sliding board transfer from wheelchair to drop arm recliner.    Ambulation/Gait               General Gait Details: NON amb.  Pt uses a wheelchair for mobility.   Stairs             Information systems manager mobility: Yes Wheelchair propulsion: Both upper extremities Distance: > 550 feet Wheelchair Assistance Details (indicate cue type and reason): Mod Indep.  NO VC's needed.  Pt fully capable to navigate around corners and perfom 180 turns plus lock and unlock brakes.  Modified Rankin (Stroke Patients Only)       Balance                                            Cognition Arousal/Alertness: Awake/alert Behavior During Therapy: WFL for tasks assessed/performed Overall Cognitive Status: Within Functional Limits for tasks assessed                                 General Comments: AxO x 3 always willing and eager to go to Rehab.        Exercises      General Comments  Pertinent Vitals/Pain Pain Assessment Pain Assessment: Faces Faces Pain Scale: Hurts a little bit Pain Location: back Pain Descriptors / Indicators: Aching, Discomfort Pain Intervention(s): Monitored during session, Repositioned    Home Living                          Prior Function            PT Goals (current goals can now be found in the care plan section) Progress towards PT goals: Progressing toward goals    Frequency    Min 2X/week      PT Plan Current plan remains appropriate    Co-evaluation              AM-PAC PT "6 Clicks" Mobility   Outcome  Measure  Help needed turning from your back to your side while in a flat bed without using bedrails?: A Lot Help needed moving from lying on your back to sitting on the side of a flat bed without using bedrails?: A Lot Help needed moving to and from a bed to a chair (including a wheelchair)?: A Lot Help needed standing up from a chair using your arms (e.g., wheelchair or bedside chair)?: Total Help needed to walk in hospital room?: Total Help needed climbing 3-5 steps with a railing? : Total 6 Click Score: 9    End of Session Equipment Utilized During Treatment: Gait belt Activity Tolerance: Patient tolerated treatment well Patient left: in chair;with call bell/phone within reach;with chair alarm set Nurse Communication: Mobility status PT Visit Diagnosis: Other symptoms and signs involving the nervous system (R29.898)     Time: 6812-7517 PT Time Calculation (min) (ACUTE ONLY): 16 min  Charges:  $Therapeutic Activity: 8-22 mins                      {Marija Calamari  PTA Acute  [facility] M-F          [phone] Weekend pager [phone]

## 2022-11-17 DIAGNOSIS — T887XXA Unspecified adverse effect of drug or medicament, initial encounter: Secondary | ICD-10-CM | POA: Diagnosis not present

## 2022-11-17 DIAGNOSIS — A419 Sepsis, unspecified organism: Secondary | ICD-10-CM | POA: Diagnosis not present

## 2022-11-17 DIAGNOSIS — Z743 Need for continuous supervision: Secondary | ICD-10-CM | POA: Diagnosis not present

## 2022-11-17 DIAGNOSIS — I10 Essential (primary) hypertension: Secondary | ICD-10-CM | POA: Diagnosis not present

## 2022-11-17 DIAGNOSIS — K626 Ulcer of anus and rectum: Secondary | ICD-10-CM | POA: Diagnosis not present

## 2022-11-17 DIAGNOSIS — F332 Major depressive disorder, recurrent severe without psychotic features: Secondary | ICD-10-CM | POA: Diagnosis not present

## 2022-11-17 DIAGNOSIS — F29 Unspecified psychosis not due to a substance or known physiological condition: Secondary | ICD-10-CM | POA: Diagnosis not present

## 2022-11-17 DIAGNOSIS — S24109S Unspecified injury at unspecified level of thoracic spinal cord, sequela: Secondary | ICD-10-CM | POA: Diagnosis not present

## 2022-11-17 DIAGNOSIS — M109 Gout, unspecified: Secondary | ICD-10-CM | POA: Diagnosis not present

## 2022-11-17 DIAGNOSIS — R300 Dysuria: Secondary | ICD-10-CM | POA: Diagnosis not present

## 2022-11-17 DIAGNOSIS — F331 Major depressive disorder, recurrent, moderate: Secondary | ICD-10-CM | POA: Diagnosis not present

## 2022-11-17 DIAGNOSIS — Z7189 Other specified counseling: Secondary | ICD-10-CM | POA: Diagnosis not present

## 2022-11-17 DIAGNOSIS — K581 Irritable bowel syndrome with constipation: Secondary | ICD-10-CM | POA: Diagnosis not present

## 2022-11-17 DIAGNOSIS — T50902A Poisoning by unspecified drugs, medicaments and biological substances, intentional self-harm, initial encounter: Secondary | ICD-10-CM | POA: Diagnosis not present

## 2022-11-17 DIAGNOSIS — R933 Abnormal findings on diagnostic imaging of other parts of digestive tract: Secondary | ICD-10-CM | POA: Diagnosis not present

## 2022-11-17 DIAGNOSIS — R7881 Bacteremia: Secondary | ICD-10-CM | POA: Diagnosis not present

## 2022-11-17 DIAGNOSIS — R4182 Altered mental status, unspecified: Secondary | ICD-10-CM | POA: Diagnosis not present

## 2022-11-17 DIAGNOSIS — F4323 Adjustment disorder with mixed anxiety and depressed mood: Secondary | ICD-10-CM | POA: Diagnosis not present

## 2022-11-17 DIAGNOSIS — K922 Gastrointestinal hemorrhage, unspecified: Secondary | ICD-10-CM | POA: Diagnosis not present

## 2022-11-17 DIAGNOSIS — F333 Major depressive disorder, recurrent, severe with psychotic symptoms: Secondary | ICD-10-CM | POA: Diagnosis not present

## 2022-11-17 DIAGNOSIS — F419 Anxiety disorder, unspecified: Secondary | ICD-10-CM | POA: Diagnosis not present

## 2022-11-17 DIAGNOSIS — T50902S Poisoning by unspecified drugs, medicaments and biological substances, intentional self-harm, sequela: Secondary | ICD-10-CM | POA: Diagnosis not present

## 2022-11-17 DIAGNOSIS — N319 Neuromuscular dysfunction of bladder, unspecified: Secondary | ICD-10-CM | POA: Diagnosis not present

## 2022-11-17 DIAGNOSIS — R29898 Other symptoms and signs involving the musculoskeletal system: Secondary | ICD-10-CM | POA: Diagnosis not present

## 2022-11-17 DIAGNOSIS — N179 Acute kidney failure, unspecified: Secondary | ICD-10-CM | POA: Diagnosis not present

## 2022-11-17 DIAGNOSIS — K592 Neurogenic bowel, not elsewhere classified: Secondary | ICD-10-CM | POA: Diagnosis not present

## 2022-11-17 DIAGNOSIS — Z91199 Patient's noncompliance with other medical treatment and regimen due to unspecified reason: Secondary | ICD-10-CM | POA: Diagnosis not present

## 2022-11-17 DIAGNOSIS — F411 Generalized anxiety disorder: Secondary | ICD-10-CM | POA: Diagnosis not present

## 2022-11-17 DIAGNOSIS — M6281 Muscle weakness (generalized): Secondary | ICD-10-CM | POA: Diagnosis not present

## 2022-11-17 DIAGNOSIS — E8809 Other disorders of plasma-protein metabolism, not elsewhere classified: Secondary | ICD-10-CM | POA: Diagnosis not present

## 2022-11-17 DIAGNOSIS — T50904A Poisoning by unspecified drugs, medicaments and biological substances, undetermined, initial encounter: Secondary | ICD-10-CM | POA: Diagnosis not present

## 2022-11-17 DIAGNOSIS — C61 Malignant neoplasm of prostate: Secondary | ICD-10-CM | POA: Diagnosis not present

## 2022-11-17 DIAGNOSIS — L89626 Pressure-induced deep tissue damage of left heel: Secondary | ICD-10-CM | POA: Diagnosis not present

## 2022-11-17 DIAGNOSIS — N3281 Overactive bladder: Secondary | ICD-10-CM | POA: Diagnosis not present

## 2022-11-17 DIAGNOSIS — R5381 Other malaise: Secondary | ICD-10-CM | POA: Diagnosis not present

## 2022-11-17 DIAGNOSIS — L89152 Pressure ulcer of sacral region, stage 2: Secondary | ICD-10-CM | POA: Diagnosis not present

## 2022-11-17 DIAGNOSIS — G928 Other toxic encephalopathy: Secondary | ICD-10-CM | POA: Diagnosis not present

## 2022-11-17 DIAGNOSIS — E559 Vitamin D deficiency, unspecified: Secondary | ICD-10-CM | POA: Diagnosis not present

## 2022-11-17 DIAGNOSIS — G4733 Obstructive sleep apnea (adult) (pediatric): Secondary | ICD-10-CM | POA: Diagnosis not present

## 2022-11-17 DIAGNOSIS — K5289 Other specified noninfective gastroenteritis and colitis: Secondary | ICD-10-CM | POA: Diagnosis not present

## 2022-11-17 DIAGNOSIS — G8221 Paraplegia, complete: Secondary | ICD-10-CM | POA: Diagnosis not present

## 2022-11-17 DIAGNOSIS — L89153 Pressure ulcer of sacral region, stage 3: Secondary | ICD-10-CM | POA: Diagnosis not present

## 2022-11-17 DIAGNOSIS — G5601 Carpal tunnel syndrome, right upper limb: Secondary | ICD-10-CM | POA: Diagnosis not present

## 2022-11-17 DIAGNOSIS — R110 Nausea: Secondary | ICD-10-CM | POA: Diagnosis not present

## 2022-11-17 DIAGNOSIS — F5105 Insomnia due to other mental disorder: Secondary | ICD-10-CM | POA: Diagnosis not present

## 2022-11-17 DIAGNOSIS — K219 Gastro-esophageal reflux disease without esophagitis: Secondary | ICD-10-CM | POA: Diagnosis not present

## 2022-11-17 DIAGNOSIS — R441 Visual hallucinations: Secondary | ICD-10-CM | POA: Diagnosis not present

## 2022-11-17 DIAGNOSIS — E876 Hypokalemia: Secondary | ICD-10-CM | POA: Diagnosis not present

## 2022-11-17 MED ORDER — SENNOSIDES-DOCUSATE SODIUM 8.6-50 MG PO TABS: 1.0000 | ORAL_TABLET | Freq: Two times a day (BID) | ORAL | Status: AC

## 2022-11-17 MED ORDER — MELATONIN 10 MG PO TABS: 10.0000 mg | ORAL_TABLET | Freq: Every day | ORAL | 0 refills | Status: AC

## 2022-11-17 MED ORDER — MEDIHONEY WOUND/BURN DRESSING EX PSTE: 1.0000 | PASTE | Freq: Every day | CUTANEOUS | Status: AC

## 2022-11-17 MED ORDER — HALOPERIDOL 2 MG PO TABS: 2.0000 mg | ORAL_TABLET | Freq: Two times a day (BID) | ORAL | Status: DC | PRN

## 2022-11-17 MED ORDER — BACLOFEN 20 MG PO TABS: 10.0000 mg | ORAL_TABLET | Freq: Four times a day (QID) | ORAL | Status: AC

## 2022-11-17 MED ORDER — AMLODIPINE BESYLATE 10 MG PO TABS: 10.0000 mg | ORAL_TABLET | Freq: Every day | ORAL | Status: DC

## 2022-11-17 MED ORDER — LOSARTAN POTASSIUM 25 MG PO TABS: 25.0000 mg | ORAL_TABLET | Freq: Every day | ORAL | Status: DC

## 2022-11-17 MED ORDER — POTASSIUM CHLORIDE CRYS ER 20 MEQ PO TBCR: 20.0000 meq | EXTENDED_RELEASE_TABLET | Freq: Every day | ORAL | Status: AC

## 2022-11-17 MED ORDER — CLONAZEPAM 0.5 MG PO TABS: 0.5000 mg | ORAL_TABLET | Freq: Two times a day (BID) | ORAL | 0 refills | Status: AC

## 2022-11-17 MED ORDER — DOCUSATE SODIUM 100 MG PO CAPS: 100.0000 mg | ORAL_CAPSULE | Freq: Two times a day (BID) | ORAL | 0 refills | Status: AC

## 2022-11-17 MED ORDER — PANTOPRAZOLE SODIUM 40 MG PO TBEC: 40.0000 mg | DELAYED_RELEASE_TABLET | Freq: Every day | ORAL | Status: AC

## 2022-11-17 MED ORDER — RISPERIDONE 0.25 MG PO TABS: 0.2500 mg | ORAL_TABLET | Freq: Every day | ORAL | Status: AC

## 2022-11-17 MED ORDER — POLYETHYLENE GLYCOL 3350 17 G PO PACK: 17.0000 g | PACK | Freq: Two times a day (BID) | ORAL | 0 refills | Status: AC

## 2022-11-17 MED ORDER — ACETAMINOPHEN 325 MG PO TABS: 650.0000 mg | ORAL_TABLET | Freq: Four times a day (QID) | ORAL | Status: AC | PRN

## 2022-11-17 NOTE — Progress Notes (Signed)
Report called to the receiving RN at [facility].

## 2022-11-17 NOTE — Plan of Care (Signed)
  Problem: Health Behavior/Discharge Planning: °Goal: Ability to manage health-related needs will improve °Outcome: Adequate for Discharge °  °Problem: Clinical Measurements: °Goal: Ability to maintain clinical measurements within normal limits will improve °Outcome: Adequate for Discharge °Goal: Will remain free from infection °Outcome: Adequate for Discharge °Goal: Diagnostic test results will improve °Outcome: Adequate for Discharge °Goal: Respiratory complications will improve °Outcome: Adequate for Discharge °Goal: Cardiovascular complication will be avoided °Outcome: Adequate for Discharge °  °Problem: Activity: °Goal: Risk for activity intolerance will decrease °Outcome: Adequate for Discharge °  °Problem: Nutrition: °Goal: Adequate nutrition will be maintained °Outcome: Adequate for Discharge °  °Problem: Coping: °Goal: Level of anxiety will decrease °Outcome: Adequate for Discharge °  °Problem: Elimination: °Goal: Will not experience complications related to bowel motility °Outcome: Adequate for Discharge °Goal: Will not experience complications related to urinary retention °Outcome: Adequate for Discharge °  °Problem: Pain Managment: °Goal: General experience of comfort will improve °Outcome: Adequate for Discharge °  °Problem: Safety: °Goal: Ability to remain free from injury will improve °Outcome: Adequate for Discharge °  °Problem: Skin Integrity: °Goal: Risk for impaired skin integrity will decrease °Outcome: Adequate for Discharge °  °Problem: Education: °Goal: Knowledge of General Education information will improve °Description: Including pain rating scale, medication(s)/side effects and non-pharmacologic comfort measures °Outcome: Adequate for Discharge °  °Problem: Health Behavior/Discharge Planning: °Goal: Ability to manage health-related needs will improve °Outcome: Adequate for Discharge °  °Problem: Clinical Measurements: °Goal: Ability to maintain clinical measurements within normal limits  will improve °Outcome: Adequate for Discharge °Goal: Will remain free from infection °Outcome: Adequate for Discharge °Goal: Diagnostic test results will improve °Outcome: Adequate for Discharge °Goal: Respiratory complications will improve °Outcome: Adequate for Discharge °Goal: Cardiovascular complication will be avoided °Outcome: Adequate for Discharge °  °Problem: Activity: °Goal: Risk for activity intolerance will decrease °Outcome: Adequate for Discharge °  °Problem: Nutrition: °Goal: Adequate nutrition will be maintained °Outcome: Adequate for Discharge °  °Problem: Coping: °Goal: Level of anxiety will decrease °Outcome: Adequate for Discharge °  °Problem: Elimination: °Goal: Will not experience complications related to bowel motility °Outcome: Adequate for Discharge °Goal: Will not experience complications related to urinary retention °Outcome: Adequate for Discharge °  °Problem: Pain Managment: °Goal: General experience of comfort will improve °Outcome: Adequate for Discharge °  °Problem: Safety: °Goal: Ability to remain free from injury will improve °Outcome: Adequate for Discharge °  °Problem: Skin Integrity: °Goal: Risk for impaired skin integrity will decrease °Outcome: Adequate for Discharge °  °

## 2022-11-17 NOTE — Progress Notes (Signed)
Physical Therapy Treatment Patient Details Name: David Kim MRN: [id number] DOB: 07-Sep-1946 Today's Date: 11/17/2022   History of Present Illness [name] is a 77 yo male with PMH paraplegia, severe MDD, persistent adjustment disorder with mixed anxiety and depressed mood ,PUD, gout, HTN, IBS, OSA, prostate cancer, GERD.   He was recently evaluated under IVC last month for expressing suicidal ideation. he was cleared for discharge home on 11/13.  Patient presented to [facility] long ED after home health aide discovered patient took an unknown amount of pills on 10/15/22    PT Comments    General Comments: AxO x 3 always willing and eager to go to Rehab but also worried about "what is going to happen to me". Performed side to side rolling for hygiene General bed mobility comments: using over head trapeze and side rails to self assist. then Assisted OOB to wheelchair. General transfer comment: assisted with lateral scoot sliding board transfer from EOB to wheelchair. Product manager mobility: Yes Wheelchair propulsion: Both upper extremities Wheelchair parts: Supervision/cueing Distance: 75 Wheelchair Assistance Details (indicate cue type and reason): Mod Indep.  NO VC's needed.  Pt fully capable to navigate around corners and perfom 180 turns plus lock and unlock brakes. Then PTAR arrived to take pt to SNF.  Assisted with sliding board transfer from pt's wheelchair onto stretcher. Pt d/c to [facility].  Recommendations for follow up therapy are one component of a multi-disciplinary discharge planning process, led by the attending physician.  Recommendations may be updated based on patient status, additional functional criteria and insurance authorization.  Follow Up Recommendations  Skilled nursing-short term rehab (<3 hours/day) Can patient physically be transported by private vehicle: No   Assistance Recommended at Discharge Frequent or constant  Supervision/Assistance  Patient can return home with the following A lot of help with walking and/or transfers;A lot of help with bathing/dressing/bathroom;Assist for transportation;Help with stairs or ramp for entrance   Equipment Recommendations  None recommended by PT    Recommendations for Other Services       Precautions / Restrictions Precautions Precautions: Fall Precaution Comments: paraplegia T12 , incontinence Restrictions Weight Bearing Restrictions: No Other Position/Activity Restrictions: L LE spastic/R LE flaccid     Mobility  Bed Mobility Overal bed mobility: Needs Assistance Bed Mobility: Rolling, Sit to Supine Rolling: Min assist, Mod assist   Supine to sit: Mod assist, Max assist     General bed mobility comments: using over head trapeze and side rails to self assist.    Transfers Overall transfer level: Needs assistance Equipment used: Sliding board Transfers: Bed to chair/wheelchair/BSC            Lateral/Scoot Transfers: With slide board General transfer comment: assisted with lateral scoot sliding board transfer from EOB to wheelchair.    Ambulation/Gait                   Theme park manager mobility: Yes Wheelchair propulsion: Both upper extremities Wheelchair parts: Supervision/cueing Distance: 75 Wheelchair Assistance Details (indicate cue type and reason): Mod Indep.  NO VC's needed.  Pt fully capable to navigate around corners and perfom 180 turns plus lock and unlock brakes.  Modified Rankin (Stroke Patients Only)       Balance  Cognition Arousal/Alertness: Awake/alert Behavior During Therapy: WFL for tasks assessed/performed Overall Cognitive Status: Within Functional Limits for tasks assessed                                 General Comments: AxO x 3 always willing and eager to go to  Rehab but also worried about "what is going to happen to me".        Exercises      General Comments        Pertinent Vitals/Pain Pain Assessment Pain Assessment: No/denies pain    Home Living                          Prior Function            PT Goals (current goals can now be found in the care plan section) Progress towards PT goals: Progressing toward goals    Frequency    Min 2X/week      PT Plan Current plan remains appropriate    Co-evaluation              AM-PAC PT "6 Clicks" Mobility   Outcome Measure  Help needed turning from your back to your side while in a flat bed without using bedrails?: A Lot Help needed moving from lying on your back to sitting on the side of a flat bed without using bedrails?: A Lot Help needed moving to and from a bed to a chair (including a wheelchair)?: A Lot Help needed standing up from a chair using your arms (e.g., wheelchair or bedside chair)?: Total Help needed to walk in hospital room?: Total Help needed climbing 3-5 steps with a railing? : Total 6 Click Score: 9    End of Session Equipment Utilized During Treatment: Gait belt Activity Tolerance: Patient tolerated treatment well Patient left: Other (comment) (PTAR stretcher)   PT Visit Diagnosis: Other symptoms and signs involving the nervous system (H41.740)     Time: 8144-8185 PT Time Calculation (min) (ACUTE ONLY): 24 min  Charges:  $Therapeutic Activity: 8-22 mins $Wheel Chair Management: 8-22 mins                     David Kim  PTA Acute  Rehabilitation Services Office M-F          [phone] Weekend pager [phone]

## 2022-11-17 NOTE — Discharge Summary (Signed)
Physician Discharge Summary  David Kim OEH:[id number] DOB: 08-25-1991 DOA: 10/15/2022  PCP: Biagio Borg, MD  Admit date: 10/15/2022 Discharge date: 11/17/2022  Admitted From: Home  Discharge disposition: SNF  Recommendations for Outpatient Follow-Up:   Follow up with your primary care provider at the SNF in 3-5 days. Check CBC, BMP, magnesium in the next visit  Discharge Diagnosis:   Principal Problem:   Intentional overdose ([place]) Active Problems:   Sepsis ([place])   Toxic metabolic encephalopathy   Bacteremia due to Escherichia coli   GIB (gastrointestinal bleeding)   Metabolic acidosis   Stercoral colitis   Severe recurrent major depression (H[facility])   Pressure injury of sacral region, stage 2 (HCC)   Essential hypertension   Paraplegia ([facility])   GERD   Hypokalemia   Goals of care, counseling/discussion   Stercoral ulcer of rectum   Abnormal finding on GI tract imaging   Acute blood loss anemia   Chronic constipation   Discharge Condition: Improved.  Diet recommendation:   Regular.  Wound care: None.  Code status: DNR   History of Present Illness:   77 years old paraplegic, manic depressive disorder, persistent adjustment disorder with mixed anxiety and depression, peptic ulcer disease, gout, hypertension, irritable bowel syndrome, obstructive sleep apnea, prostate cancer, GERD who was recently placed in IVC last month for depression and suicidal ideation. At that time, he was cleared to go home with decreased Klonopin dose but again presented to hospital with unknown amount of pill ingestion including opened bottles of losartan, tizanidine, oxybutynin, baclofen, and clonazepam. Initial evaluation also revealed multiple SIRS criteria with fever of 103.28F and urinalysis suggestive of urinary tract infection prompting initiation of intravenous ceftriaxone.   Patient was initially hospitalized in the stepdown unit for suspected drug overdose with initial concern  for sepsis secondary to urinary tract infection. Urine culture was positive for pansensitive E. coli. Patient also had left lower lobe infiltrate and received cefepime and vancomycin. After several days of resolving suspected medication, patient had agitation tremors and hallucinations concerning for benzodiazepine withdrawal and was put back on benzodiazepines from 12/13 until 12/19 patient's mentation gradually improved. Extensive encephalopathy workup was undertaken including obtaining CT head, MRI brain and EEG all of which did not identify a contributing cause. Psychiatry was also consulted on admission and do not suggest inpatient psych admission or suicidality at this time. Due to guarded prognosis, palliative care consultation was additionally obtained.    Hospital Course:   Following conditions were addressed during hospitalization as listed below,   Intentional overdose ([place]) Suspected intentional overdose, found to have open bottles of losartan, tizanidine, oxybutynin, baclofen and clonazepam by EMS.  Quantity was unclear.  Was resumed on benzodiazepines and baclofen since 12/13 due to withdrawals.  Dose of medications have been adjusted.  Currently stable.  Off one-to-one sitter.  No suicidal precautions are needed at this time.  Psychiatry followed the patient during hospitalization and recommend no inpatient psychiatry admission.     Sepsis from E. coli UTI. Resolved at this time.   Acute hypoxemic respiratory failure  In the setting of overdose.  Resolved at this time.  Patient is on room air   Toxic metabolic encephalopathy Resolved at this time.  Patient had confusion, myoclonus hallucinations likely secondary to initial overdose followed by withdrawal. .  Has been resumed back on benzodiazepines and baclofen. MRI of the brain was unremarkable.  TSH B12 within normal range.  EEG on 12/15 without obvious seizure activity although this was  substantially obscured by ongoing  benzodiazepine use.  Was seen by neurology during hospitalization.   GIB (gastrointestinal bleeding) Episode of bright red blood per rectum on 12/12 followed by additional small episode morning of 12/13.  Patient had received 2 units of packed RBC on 10/17/2022.  GI was consulted and impression was stercoral colitis with ulceration.   Latest hemoglobin at 13.4.   Bacteremia due to Escherichia coli Antibiotic course completed on 10/24/22.   Stercoral colitis history of severe constipation.  Patient had history of regular rectal stimulation and disimpaction in the past.  Continue aggressive regimen with MiraLAX, senna and Colace.  Patient likely has neurogenic bowel given his history of paraplegia.  Disimpaction as needed.   Metabolic acidosis Secondary to overdose.  Resolved   Severe recurrent major depression [facility]) Psychiatry followed the patient during hospitalization.  Patient has become severely depressed since his wife passing away in October. She was essentially the patient's sole caretaker and he was completely dependent on her.  Since her passing, he has began "shutting down".  Continue Haldol, risperidone and Ativan.  Currently denies any suicidal ideation.   Pressure injury of sacral region, stage 2  Present on admission.  Continue wound care.   Essential hypertension Continue amlodipine 10 mg daily   Paraplegia ([place]) Fall precautions.  Continue pressure ulcer prevention protocol.   GERD Continue oral Protonix   Hypokalemia Latest potassium of 3.7 improved.  Disposition.  At this time, patient is stable for disposition to skilled nursing facility   Medical Consultants:   Psychiatry  Procedures:    None Subjective:   Today, patient was seen and examined at bedside.  Patient denies any pain, nausea, vomiting, fever or chills.  Has been eating okay.  Discharge Exam:   Vitals:   11/16/22 2042 11/17/22 0648  BP: 131/83 117/72  Pulse: 86 79  Resp: 18 20  Temp:  98.3 F (36.8 C) 98.1 F (36.7 C)  SpO2: 99% 100%   Vitals:   11/16/22 0543 11/16/22 1300 11/16/22 2042 11/17/22 0648  BP: 114/71 113/63 131/83 117/72  Pulse: 83 96 86 79  Resp: '20 20 18 20  '$ Temp: 98 F (36.7 C) 99 F (37.2 C) 98.3 F (36.8 C) 98.1 F (36.7 C)  TempSrc: Oral Oral Oral Oral  SpO2: 99% 100% 99% 100%  Weight:      Height:        General: Alert awake, not in obvious distress HENT: pupils equally reacting to light,  No scleral pallor or icterus noted. Oral mucosa is moist.  Chest:  Clear breath sounds.  Diminished breath sounds bilaterally. No crackles or wheezes.  CVS: S1 &S2 heard. No murmur.  Regular rate and rhythm. Abdomen: Soft, nontender, nondistended.  Bowel sounds are heard.   Extremities: No cyanosis, clubbing or edema.  Peripheral pulses are palpable. Psych: Alert, awake and oriented, normal mood CNS:  No cranial nerve deficits.  Paraplegic. Skin: Warm and dry.  Pressure injury over the sacral area.  The results of significant diagnostics from this hospitalization (including imaging, microbiology, ancillary and laboratory) are listed below for reference.     Diagnostic Studies:   EEG adult  Result Date: 10/25/2022 Lora Havens, MD     10/26/2022  8:28 AM Patient Name: David Kim MRN: [id number] Epilepsy Attending: Lora Havens Referring Physician/Provider: Vernelle Emerald, MD Date: 10/25/2022 Duration: 24.36 mins Patient history: 77 year old male with altered mental status.  EEG to evaluate for seizure. Level of alertness: Awake, asleep AEDs  during EEG study: Clonazepam Technical aspects: This EEG study was done with scalp electrodes positioned according to the 10-20 International system of electrode placement. Electrical activity was reviewed with band pass filter of 1-'70Hz'$ , sensitivity of 7 uV/mm, display speed of 68m/sec with a '60Hz'$  notched filter applied as appropriate. EEG data were recorded continuously and digitally stored.  Video  monitoring was available and reviewed as appropriate. Description: The posterior dominant rhythm consists of 9-10 Hz activity of moderate voltage (25-35 uV) seen predominantly in posterior head regions, symmetric and reactive to eye opening and eye closing. Sleep was characterized by vertex waves, sleep spindles (12 to 14 Hz), maximal frontocentral region. There is also 15 to 18 Hz beta activity distributed symmetrically and diffusely.  Hyperventilation and photic stimulation were not performed.   ABNORMALITY - Excessive beta, generalized IMPRESSION: This study is within normal limits. The excessive beta activity seen in the background is most likely due to the effect of benzodiazepine and is a benign EEG pattern. No seizures or epileptiform discharges were seen throughout the recording. PVarina   Basic Metabolic Panel: Recent Labs  Lab 11/11/22 0531 11/15/22 0519  NA 137 136  K 3.7 3.7  CL 106 105  CO2 24 24  GLUCOSE 114* 99  BUN 23 20  CREATININE 0.70 0.64  CALCIUM 8.7* 8.6*  MG  --  2.0   GFR Estimated Creatinine Clearance: 78.4 mL/min (by C-G formula based on SCr of 0.64 mg/dL). Liver Function Tests: No results for input(s): "AST", "ALT", "ALKPHOS", "BILITOT", "PROT", "ALBUMIN" in the last 168 hours. No results for input(s): "LIPASE", "AMYLASE" in the last 168 hours. No results for input(s): "AMMONIA" in the last 168 hours. Coagulation profile No results for input(s): "INR", "PROTIME" in the last 168 hours.  CBC: Recent Labs  Lab 11/11/22 0531 11/15/22 0519  WBC 9.9 8.0  HGB 14.1 13.4  HCT 44.1 42.2  MCV 91.5 92.3  PLT 231 244   Cardiac Enzymes: No results for input(s): "CKTOTAL", "CKMB", "CKMBINDEX", "TROPONINI" in the last 168 hours. BNP: Invalid input(s): "POCBNP" CBG: No results for input(s): "GLUCAP" in the last 168 hours. D-Dimer No results for input(s): "DDIMER" in the last 72 hours. Hgb A1c No results for input(s): "HGBA1C" in the last  72 hours. Lipid Profile No results for input(s): "CHOL", "HDL", "LDLCALC", "TRIG", "CHOLHDL", "LDLDIRECT" in the last 72 hours. Thyroid function studies No results for input(s): "TSH", "T4TOTAL", "T3FREE", "THYROIDAB" in the last 72 hours.  Invalid input(s): "FREET3" Anemia work up No results for input(s): "VITAMINB12", "FOLATE", "FERRITIN", "TIBC", "IRON", "RETICCTPCT" in the last 72 hours. Microbiology No results found for this or any previous visit (from the past 240 hour(s)).   Discharge Instructions:   Discharge Instructions     Diet - low sodium heart healthy   Complete by: As directed    Discharge instructions   Complete by: As directed    Follow up with your primary care provider at the skilled nursing facility in 3-5 days. Check blood work at that time.   Discharge wound care:   Complete by: As directed    Apply Medihoney to sacrum wound Q day, then cover with foam dressing. (Change foam dressing Q 3 days or PRN soiling.)   Increase activity slowly   Complete by: As directed       Allergies as of 11/17/2022       Reactions   Amoxicillin Other (See Comments)   Unknown reaction   Endal  Hd Other (See Comments)   Unknown reaction        Medication List     STOP taking these medications    CAL-MAG PO   citalopram 10 MG tablet Commonly known as: CeleXA   divalproex 500 MG 24 hr tablet Commonly known as: DEPAKOTE ER       TAKE these medications    acetaminophen 325 MG tablet Commonly known as: TYLENOL Take 2 tablets (650 mg total) by mouth every 6 (six) hours as needed for mild pain (or Fever >/= 101).   amLODipine 10 MG tablet Commonly known as: NORVASC Take 1 tablet (10 mg total) by mouth daily. Start taking on: November 18, 2022   ascorbic acid 500 MG tablet Commonly known as: VITAMIN C Take 500 mg by mouth daily.   aspirin EC 81 MG tablet Take 81 mg by mouth daily.   baclofen 20 MG tablet Commonly known as: LIORESAL Take 0.5 tablets (10  mg total) by mouth 4 (four) times daily. What changed: See the new instructions.   calcium citrate-vitamin D 500-500 MG-UNIT chewable tablet Chew 1 tablet by mouth daily.   cholecalciferol 25 MCG (1000 UNIT) tablet Commonly known as: VITAMIN D3 Take 1,000 Units by mouth daily.   clonazePAM 0.5 MG tablet Commonly known as: KLONOPIN Take 1 tablet (0.5 mg total) by mouth 2 (two) times daily. What changed:  medication strength how much to take when to take this   docusate sodium 100 MG capsule Commonly known as: COLACE Take 1 capsule (100 mg total) by mouth 2 (two) times daily.   haloperidol 2 MG tablet Commonly known as: HALDOL Take 1 tablet (2 mg total) by mouth 2 (two) times daily as needed for agitation (Agitation with threat of harm to self or others).   leptospermum manuka honey Pste paste Apply 1 Application topically daily. Apply Medihoney to sacrum wound Q day, then cover with foam dressing. (Change foam dressing Q 3 days or PRN soiling.) Apply thin layer (3 mm) to wound. Start taking on: November 18, 2022   losartan 25 MG tablet Commonly known as: COZAAR Take 1 tablet (25 mg total) by mouth daily. TAKE 1 TABLET(50 MG) BY MOUTH TWICE DAILY What changed:  medication strength how much to take how to take this when to take this   Melatonin 10 MG Tabs Take 10 mg by mouth at bedtime.   multivitamin capsule Take 1 capsule by mouth daily with breakfast.   oxybutynin 5 MG tablet Commonly known as: DITROPAN Take 5 mg by mouth 4 (four) times daily.   pantoprazole 40 MG tablet Commonly known as: PROTONIX Take 1 tablet (40 mg total) by mouth daily. Start taking on: November 18, 2022   polyethylene glycol 17 g packet Commonly known as: MIRALAX / GLYCOLAX Take 17 g by mouth 2 (two) times daily.   potassium chloride SA 20 MEQ tablet Commonly known as: KLOR-CON M Take 1 tablet (20 mEq total) by mouth daily.   risperiDONE 0.25 MG tablet Commonly known as:  RISPERDAL Take 1 tablet (0.25 mg total) by mouth at bedtime.   senna-docusate 8.6-50 MG tablet Commonly known as: Senokot-S Take 1 tablet by mouth 2 (two) times daily.   tiZANidine 4 MG tablet Commonly known as: ZANAFLEX TAKE 1-2 TABLETS BY MOUTH FOUR TIMES DAILY What changed: See the new instructions.   zinc gluconate 50 MG tablet Take 50 mg by mouth daily.  Discharge Care Instructions  (From admission, onward)           Start     Ordered   11/17/22 0000  Discharge wound care:       Comments: Apply Medihoney to sacrum wound Q day, then cover with foam dressing. (Change foam dressing Q 3 days or PRN soiling.)   11/17/22 1007              Time coordinating discharge: 39 minutes  Signed:  Aviyah Swetz  [facility] 11/17/2022, 10:12 AM

## 2022-11-17 NOTE — Consult Note (Signed)
[facility] Face-to-Face Psychiatry Consult   Reason for Consult:  suicidal attempt Referring Physician:  Dr. Cyd Silence Patient Identification: NAOL ONTIVEROS MRN:  [id number] Principal Diagnosis: Intentional overdose [facility]) Diagnosis:  Principal Problem:   Intentional overdose ([place]) Active Problems:   Severe recurrent major depression ([place])   Paraplegia ([place])   Essential hypertension   GERD   Toxic metabolic encephalopathy   Goals of care, counseling/discussion   Sepsis ([place])   Metabolic acidosis   Bacteremia due to Escherichia coli   Pressure injury of sacral region, stage 2 ([place])   Stercoral colitis   GIB (gastrointestinal bleeding)   Hypokalemia   Stercoral ulcer of rectum   Abnormal finding on GI tract imaging   Acute blood loss anemia   Chronic constipation   Total Time spent with patient: 45 minutes  Subjective:   CIEL YANES is a 77 y.o. male patient admitted after suicide attempt on multiple sedative/hypnotic type medications.   HPI: Patient seen in his room, watching TV.No Sitter is at the bedside. Patient is alert, oriented in x 4.  He describes his mood as "pretty sad".  When asked to clarify mood, he states " when I am alone.  My wife.  I am 3 years old and I do not qualify for long-term care.  I need our computer so I can work on my taxes and finances. " He is sitting upright in his bed, very jovial and engaging well.  a lot. He reports he is sleeping well " I am not sure how but I am sleeping better at night." He states he is eating and sleeping well at this time. He states his sleep has improved with compliance of his CPAP. Patient asked about his acceptances to SNF, in which he remains guarded. He continues to endorse wanting to remain at home.  He denies any depression, anxiety, or trauma symptoms at this time. He continues to deny suicidal ideation, and or any plans to attempt suicide in the near future.  He continues to be able to contract for safety in the hospital  and at a skilled nurse facility. He has persistently denied suicidal ideations for the past month. After completion of his treatment in a skilled nursing facility, will need to transition to long-term care.  Did contact next-of-kin today [name] waited to discuss these concerns.  Also provided [name] with contact phone numbers and addresses for 5 long-term care facilities.   Mark verbalizes understanding to include patient remaining at high risk for suicide completion if discharged home.   Past Medical History:  Past Medical History:  Diagnosis Date   Allergic rhinitis    ALLERGIC RHINITIS 10/01/2007   Qualifier: Diagnosis of  By: Jenny Reichmann MD, Hunt Oris    Anxiety    Carpal tunnel syndrome    right   Depression    Depression    Erectile dysfunction    Fatigue    GERD (gastroesophageal reflux disease)    Gout    Hypertension    HYPOGONADISM 03/31/2008   Qualifier: Diagnosis of  By: Jenny Reichmann MD, Hunt Oris    IBS (irritable bowel syndrome)    Low back pain    OSA (obstructive sleep apnea)    Paraplegia ([place]) 04/23/2009   Qualifier: Diagnosis of  By: Jenny Reichmann MD, Hunt Oris    Peptic ulcer disease    PEPTIC ULCER DISEASE 10/01/2007   Qualifier: Diagnosis of  By: Jenny Reichmann MD, Hunt Oris    Prostate cancer [facility])    PROSTATE CANCER,  HX OF 05/23/2007   Qualifier: Diagnosis of  By: Jenny Reichmann MD, [place] PARALYSIS 04/23/2009   Qualifier: Diagnosis of  By: Jenny Reichmann MD, Hunt Oris    Thoracic spinal cord injury [facility]) 03/12/2012    Past Surgical History:  Procedure Laterality Date   ARTERY AND TENDON REPAIR Left 09/06/2018   Procedure: ARTERY AND TENDON REPAIR;  Surgeon: Charlotte Crumb, MD;  Location: [place];  Service: Orthopedics;  Laterality: Left;   CAST APPLICATION Left 96/12/2295   Procedure: CAST APPLICATION;  Surgeon: Charlotte Crumb, MD;  Location: [place];  Service: Orthopedics;  Laterality: Left;   inguinal heniorrhaphy     left leg surgery after fibula     NERVE REPAIR Left 09/06/2018   Procedure: NERVE REPAIR  TIMES TWO;  Surgeon: Charlotte Crumb, MD;  Location: [place];  Service: Orthopedics;  Laterality: Left;   OPEN REDUCTION INTERNAL FIXATION (ORIF) DISTAL PHALANX Left 09/06/2018   Procedure: OPEN REDUCTION INTERNAL FIXATION (ORIF) LEFT LONG FINGER;  Surgeon: Charlotte Crumb, MD;  Location: [place];  Service: Orthopedics;  Laterality: Left;   PILONIDAL CYST / SINUS EXCISION     PROSTATECTOMY     tonsillectomy     WOUND EXPLORATION Left 09/06/2018   Procedure: EXPLORATION OFCOMPLEX INJURY;  Surgeon: Charlotte Crumb, MD;  Location: [place];  Service: Orthopedics;  Laterality: Left;   Family History:  Family History  Problem Relation Age of Onset   High blood pressure Mother    Dementia Mother    Diabetes Father     Social History:  Social History   Substance and Sexual Activity  Alcohol Use No     Social History   Substance and Sexual Activity  Drug Use No    Social History   Socioeconomic History   Marital status: Married    Spouse name: [name]   Number of children: 0   Years of education: 12   Highest education level: Not on file  Occupational History   Occupation: retired    Fish farm manager: RETIRED  Tobacco Use   Smoking status: Never   Smokeless tobacco: Never  Vaping Use   Vaping Use: Never used  Substance and Sexual Activity   Alcohol use: No   Drug use: No   Sexual activity: Not on file  Other Topics Concern   Not on file  Social History Narrative   Patient lives at home with [name]). Patient is disabled. Patient has 12 th grade education.   Right handed.   Caffeine- None   Social Determinants of Health   Financial Resource Strain: Low Risk  (07/24/2022)   Overall Financial Resource Strain (CARDIA)    Difficulty of Paying Living Expenses: Not hard at all  Food Insecurity: No Food Insecurity (10/20/2022)   Hunger Vital Sign    Worried About Running Out of Food in the Last Year: Never true    Ran Out of Food in the Last Year: Never true   Transportation Needs: No Transportation Needs (10/20/2022)   PRAPARE - Hydrologist (Medical): No    Lack of Transportation (Non-Medical): No  Physical Activity: Inactive (07/24/2022)   Exercise Vital Sign    Days of Exercise per Week: 0 days    Minutes of Exercise per Session: 0 min  Stress: No Stress Concern Present (07/24/2022)   [place]    Feeling of Stress : Not at all  Social Connections: Moderately Isolated (07/24/2022)  Social Licensed conveyancer [NHANES]    Frequency of Communication with Friends and Family: More than three times a week    Frequency of Social Gatherings with Friends and Family: More than three times a week    Attends Religious Services: Never    Marine scientist or Organizations: No    Attends Archivist Meetings: Never    Marital Status: Married   Additional Social History:    Allergies:   Allergies  Allergen Reactions   Amoxicillin Other (See Comments)    Unknown reaction   Endal Hd Other (See Comments)    Unknown reaction    Labs:  No results found for this or any previous visit (from the past 48 hour(s)).   Current Facility-Administered Medications  Medication Dose Route Frequency Provider Last Rate Last Admin   0.9 %  sodium chloride infusion   Intravenous PRN Shalhoub, Sherryll Burger, MD   Paused at 11/02/22 1315   acetaminophen (TYLENOL) tablet 650 mg  650 mg Oral Q6H PRN Dwyane Dee, MD   650 mg at 10/17/22 7622   Or   acetaminophen (TYLENOL) suppository 650 mg  650 mg Rectal Q6H PRN Dwyane Dee, MD   650 mg at 10/17/22 1526   amLODipine (NORVASC) tablet 10 mg  10 mg Oral Daily Vernelle Emerald, MD   10 mg at 11/17/22 6333   baclofen (LIORESAL) tablet 10 mg  10 mg Oral BID Vernelle Emerald, MD   10 mg at 11/17/22 5456   clonazePAM (KLONOPIN) tablet 0.5 mg  0.5 mg Oral BID Vernelle Emerald, MD   0.5 mg at  11/17/22 2563   docusate sodium (COLACE) capsule 100 mg  100 mg Oral BID Mansouraty, Telford Nab., MD   100 mg at 11/17/22 0828   enoxaparin (LOVENOX) injection 40 mg  40 mg Subcutaneous Q24H Dwyane Dee, MD   40 mg at 11/17/22 8937   haloperidol (HALDOL) tablet 2 mg  2 mg Oral BID PRN Cinderella, Margaret A       And   LORazepam (ATIVAN) tablet 1 mg  1 mg Oral BID PRN Cinderella, Margaret A       hydrALAZINE (APRESOLINE) injection 10 mg  10 mg Intravenous Q6H PRN Shalhoub, Sherryll Burger, MD       leptospermum manuka honey ([place]) paste 1 Application  1 Application Topical Daily Dwyane Dee, MD   1 Application at 34/28/76 8115   melatonin tablet 10 mg  10 mg Oral QHS Vernelle Emerald, MD   10 mg at 11/16/22 2134   Oral care mouth rinse  15 mL Mouth Rinse PRN Shalhoub, Sherryll Burger, MD       pantoprazole (PROTONIX) EC tablet 40 mg  40 mg Oral Daily Shalhoub, Sherryll Burger, MD   40 mg at 11/17/22 0828   polyethylene glycol (MIRALAX / GLYCOLAX) packet 17 g  17 g Oral BID Esterwood, Amy S, PA-C   17 g at 11/17/22 7262   polyvinyl alcohol (LIQUIFILM TEARS) 1.4 % ophthalmic solution 1 drop  1 drop Both Eyes PRN Dwyane Dee, MD   1 drop at 10/17/22 2229   potassium chloride SA (KLOR-CON M) CR tablet 20 mEq  20 mEq Oral BID Dibia, Manfred Shirts, MD   20 mEq at 11/17/22 0355   potassium chloride SA (KLOR-CON M) CR tablet 40 mEq  40 mEq Oral Once [place], MD       risperiDONE (RISPERDAL) tablet 0.25 mg  0.25 mg Oral  QHS Suella Broad, FNP   0.25 mg at 11/16/22 2134   senna-docusate (Senokot-S) tablet 1 tablet  1 tablet Oral BID Dwyane Dee, MD   1 tablet at 11/17/22 0828   tiZANidine (ZANAFLEX) tablet 4 mg  4 mg Oral Q6H PRN Vernelle Emerald, MD   4 mg at 11/15/22 1400   Current Outpatient Medications  Medication Sig Dispense Refill   aspirin 81 MG EC tablet Take 81 mg by mouth daily.     calcium citrate-vitamin D 500-500 MG-UNIT chewable tablet Chew 1 tablet by mouth daily.      cholecalciferol (VITAMIN D3) 25 MCG (1000 UT) tablet Take 1,000 Units by mouth daily.     Multiple Vitamin (MULTIVITAMIN) capsule Take 1 capsule by mouth daily with breakfast.     oxybutynin (DITROPAN) 5 MG tablet Take 5 mg by mouth 4 (four) times daily.     tiZANidine (ZANAFLEX) 4 MG tablet TAKE 1-2 TABLETS BY MOUTH FOUR TIMES DAILY (Patient taking differently: Take 4 mg by mouth 4 (four) times daily.) 720 tablet 2   vitamin C (ASCORBIC ACID) 500 MG tablet Take 500 mg by mouth daily.     zinc gluconate 50 MG tablet Take 50 mg by mouth daily.     acetaminophen (TYLENOL) 325 MG tablet Take 2 tablets (650 mg total) by mouth every 6 (six) hours as needed for mild pain (or Fever >/= 101).     [START ON 11/18/2022] amLODipine (NORVASC) 10 MG tablet Take 1 tablet (10 mg total) by mouth daily.     baclofen (LIORESAL) 20 MG tablet Take 0.5 tablets (10 mg total) by mouth 4 (four) times daily.     clonazePAM (KLONOPIN) 0.5 MG tablet Take 1 tablet (0.5 mg total) by mouth 2 (two) times daily. 10 tablet 0   docusate sodium (COLACE) 100 MG capsule Take 1 capsule (100 mg total) by mouth 2 (two) times daily. 10 capsule 0   haloperidol (HALDOL) 2 MG tablet Take 1 tablet (2 mg total) by mouth 2 (two) times daily as needed for agitation (Agitation with threat of harm to self or others).     [START ON 11/18/2022] leptospermum manuka honey (MEDIHONEY) PSTE paste Apply 1 Application topically daily. Apply Medihoney to sacrum wound Q day, then cover with foam dressing. (Change foam dressing Q 3 days or PRN soiling.) Apply thin layer (3 mm) to wound.     losartan (COZAAR) 25 MG tablet Take 1 tablet (25 mg total) by mouth daily. TAKE 1 TABLET(50 MG) BY MOUTH TWICE DAILY     melatonin 10 MG TABS Take 10 mg by mouth at bedtime.  0   [START ON 11/18/2022] pantoprazole (PROTONIX) 40 MG tablet Take 1 tablet (40 mg total) by mouth daily.     polyethylene glycol (MIRALAX / GLYCOLAX) 17 g packet Take 17 g by mouth 2 (two) times daily.  14 each 0   potassium chloride SA (KLOR-CON M) 20 MEQ tablet Take 1 tablet (20 mEq total) by mouth daily.     risperiDONE (RISPERDAL) 0.25 MG tablet Take 1 tablet (0.25 mg total) by mouth at bedtime.     senna-docusate (SENOKOT-S) 8.6-50 MG tablet Take 1 tablet by mouth 2 (two) times daily.      Musculoskeletal: Strength & Muscle Tone: decreased Gait & Station: unable to stand Patient leans: N/A   Psychiatric Specialty Exam:   Appearance:  CM, wearing hospital clothes, with ok grooming and hygiene. Appropriate level of alertness .  Attitude/Behavior: engaging with  no eye contact.  Motor: WNL; dyskinesias not evident.   Speech: clear, coherent  Mood: appears euthymic  Affect: blunted  Thought process:WNL  Thought content: WNL  Thought perception: Denies   Cognition: patient is alert oriented x4, with improved  attention and concentration.  Insight: good  Judgement: good   Physical Exam: Physical Exam Vitals and nursing note reviewed.  Constitutional:      Appearance: Normal appearance. He is normal weight.  Skin:    Capillary Refill: Capillary refill takes less than 2 seconds.  Neurological:     General: No focal deficit present.     Mental Status: He is alert and oriented to person, place, and time. Mental status is at baseline.  Psychiatric:        Attention and Perception: Attention and perception normal.        Mood and Affect: Mood and affect normal.        Speech: Speech normal.        Behavior: Behavior normal.        Thought Content: Thought content normal.        Cognition and Memory: Cognition and memory normal.        Judgment: Judgment normal.    Review of Systems  Psychiatric/Behavioral:  Negative for depression, hallucinations, memory loss, substance abuse and suicidal ideas (denies). The patient is not nervous/anxious and does not have insomnia.   All other systems reviewed and are negative.  Blood pressure 117/72, pulse 79, temperature  98.1 F (36.7 C), temperature source Oral, resp. rate 20, height '5\' 8"'$  (1.727 m), weight 76.6 kg, SpO2 100 %. Body mass index is 25.68 kg/m.  Treatment Plan Summary: Daily contact with patient to assess and evaluate symptoms and progress in treatment and Medication management  Assessment and Plan: Patient is a 77 y.o. male patient admitted after suicide attempt on multiple sedative/hypnotic type medications.   Patient continues to show much improvement in progression.  Continue Risperdal 0.25 mg nightly Continue Klonopin 0.5 mg p.o. twice daily as needed   Disposition: Patient no longer meets inpatient psychiatric criteria for acute hospital stabilization.  Patient has essentially been stabilized during this 5-week length of stay as evidenced by response to medication, improvement in mood, interaction with staff, and now denial of suicidal ideations.  Patient will need to remain in the hospital until safe disposition is obtained.  Psychiatric consult service will continue to follow weekly to assist with search and any other needs from the medical team at this time.  Patient's family is interested in becoming healthcare power of attorney.  Family stated that they will continue to search for long-term care/retirement home.  -Patient has been accepted to [facility] for skilled nursing rehabilitation, expected transfer today.  Next-of-kin has been updated pending transfer facility name, phone number.  Suella Broad, FNP 11/17/2022 1:25 PM

## 2022-11-17 NOTE — TOC Transition Note (Addendum)
Transition of Care [facility]) - CM/SW Discharge Note   Patient Details  Name: David Kim MRN: [id number] Date of Birth: 17-Feb-1946  [facility]) CM/SW Contact:  Leeroy Cha, RN Phone Number: 11/17/2022, 10:13 AM   Clinical Narrative:    Patient going to [facility] room [facility] via [facility]. [facility] called for transport at 1012. Transfer packet placed with the unit clerk with script and dnr forms to be signed.  Final next level of care: [place] Barriers to Discharge: Barriers Resolved   Patient Goals and CMS Choice      Discharge Placement                         Discharge Plan and Services Additional resources added to the After Visit Summary for     Discharge Planning Services: CM Consult                                 Social Determinants of Health (SDOH) Interventions SDOH Screenings   Food Insecurity: No Food Insecurity (10/20/2022)  Housing: Low Risk  (10/20/2022)  Transportation Needs: No Transportation Needs (10/20/2022)  Utilities: Not At Risk (10/20/2022)  Alcohol Screen: Low Risk  (07/24/2022)  Depression (PHQ2-9): Low Risk  (04/05/2022)  Financial Resource Strain: Low Risk  (07/24/2022)  Physical Activity: Inactive (07/24/2022)  Social Connections: Moderately Isolated (07/24/2022)  Stress: No Stress Concern Present (07/24/2022)  Tobacco Use: Low Risk  (10/20/2022)     Readmission Risk Interventions   Row Labels 10/27/2022   10:19 AM  Readmission Risk Prevention Plan   Section Header. No data exists in this row.   Transportation Screening   Complete  PCP or Specialist Appt within 5-7 Days   Complete  Home Care Screening   Complete  Medication Review (RN CM)   Complete

## 2022-11-20 DIAGNOSIS — L89153 Pressure ulcer of sacral region, stage 3: Secondary | ICD-10-CM | POA: Diagnosis not present

## 2022-11-20 DIAGNOSIS — C61 Malignant neoplasm of prostate: Secondary | ICD-10-CM | POA: Diagnosis not present

## 2022-11-20 DIAGNOSIS — A419 Sepsis, unspecified organism: Secondary | ICD-10-CM | POA: Diagnosis not present

## 2022-11-20 DIAGNOSIS — E559 Vitamin D deficiency, unspecified: Secondary | ICD-10-CM | POA: Diagnosis not present

## 2022-11-21 DIAGNOSIS — F411 Generalized anxiety disorder: Secondary | ICD-10-CM | POA: Diagnosis not present

## 2022-11-22 DIAGNOSIS — R110 Nausea: Secondary | ICD-10-CM | POA: Diagnosis not present

## 2022-11-22 DIAGNOSIS — R5381 Other malaise: Secondary | ICD-10-CM | POA: Diagnosis not present

## 2022-11-22 DIAGNOSIS — F419 Anxiety disorder, unspecified: Secondary | ICD-10-CM | POA: Diagnosis not present

## 2022-11-24 DIAGNOSIS — I10 Essential (primary) hypertension: Secondary | ICD-10-CM | POA: Diagnosis not present

## 2022-11-24 DIAGNOSIS — F332 Major depressive disorder, recurrent severe without psychotic features: Secondary | ICD-10-CM | POA: Diagnosis not present

## 2022-11-24 DIAGNOSIS — G8221 Paraplegia, complete: Secondary | ICD-10-CM | POA: Diagnosis not present

## 2022-11-24 DIAGNOSIS — F411 Generalized anxiety disorder: Secondary | ICD-10-CM | POA: Diagnosis not present

## 2022-11-27 ENCOUNTER — Telehealth: Payer: Self-pay | Admitting: Internal Medicine

## 2022-11-27 DIAGNOSIS — C61 Malignant neoplasm of prostate: Secondary | ICD-10-CM | POA: Diagnosis not present

## 2022-11-27 DIAGNOSIS — L89153 Pressure ulcer of sacral region, stage 3: Secondary | ICD-10-CM | POA: Diagnosis not present

## 2022-11-27 DIAGNOSIS — A419 Sepsis, unspecified organism: Secondary | ICD-10-CM | POA: Diagnosis not present

## 2022-11-27 DIAGNOSIS — L89626 Pressure-induced deep tissue damage of left heel: Secondary | ICD-10-CM | POA: Diagnosis not present

## 2022-11-29 NOTE — Telephone Encounter (Signed)
Pulled report from [facility] on patients cpap machine, printed last office note stating current cpap pressure setting and the last order for cpap machine. Nothing further needed

## 2022-12-04 DIAGNOSIS — R300 Dysuria: Secondary | ICD-10-CM | POA: Diagnosis not present

## 2022-12-06 ENCOUNTER — Other Ambulatory Visit: Payer: Self-pay | Admitting: *Deleted

## 2022-12-06 NOTE — Patient Outreach (Signed)
[place] Coordinator follow up

## 2022-12-07 DIAGNOSIS — F5105 Insomnia due to other mental disorder: Secondary | ICD-10-CM | POA: Diagnosis not present

## 2022-12-07 DIAGNOSIS — F419 Anxiety disorder, unspecified: Secondary | ICD-10-CM | POA: Diagnosis not present

## 2022-12-07 DIAGNOSIS — F331 Major depressive disorder, recurrent, moderate: Secondary | ICD-10-CM | POA: Diagnosis not present

## 2022-12-13 ENCOUNTER — Other Ambulatory Visit: Payer: Self-pay | Admitting: *Deleted

## 2022-12-13 NOTE — Patient Outreach (Signed)
Mr. Katzenstein resides in [facility]. Screening for potential [facility] care coordination services as benefit of health plan and Primary Care Provider.   Facility site visit to [facility] earlier today. Met with therapy manager and social work team. Care plan meeting this past Tuesday. Transition plan is likely ALF.  Will continue to follow.   Marthenia Rolling, MSN, RN,BSN [place] Acute Care Coordinator [phone] (Direct dial)

## 2022-12-18 DIAGNOSIS — Z91199 Patient's noncompliance with other medical treatment and regimen due to unspecified reason: Secondary | ICD-10-CM | POA: Diagnosis not present

## 2022-12-18 DIAGNOSIS — G4733 Obstructive sleep apnea (adult) (pediatric): Secondary | ICD-10-CM | POA: Diagnosis not present

## 2022-12-21 ENCOUNTER — Encounter: Payer: Self-pay | Admitting: Internal Medicine

## 2022-12-28 DIAGNOSIS — R29898 Other symptoms and signs involving the musculoskeletal system: Secondary | ICD-10-CM | POA: Diagnosis not present

## 2022-12-28 DIAGNOSIS — G8221 Paraplegia, complete: Secondary | ICD-10-CM | POA: Diagnosis not present

## 2023-01-10 DIAGNOSIS — F411 Generalized anxiety disorder: Secondary | ICD-10-CM | POA: Diagnosis not present

## 2023-01-10 DIAGNOSIS — F332 Major depressive disorder, recurrent severe without psychotic features: Secondary | ICD-10-CM | POA: Diagnosis not present

## 2023-01-10 DIAGNOSIS — I10 Essential (primary) hypertension: Secondary | ICD-10-CM | POA: Diagnosis not present

## 2023-01-10 DIAGNOSIS — G8221 Paraplegia, complete: Secondary | ICD-10-CM | POA: Diagnosis not present

## 2023-01-11 DIAGNOSIS — F5105 Insomnia due to other mental disorder: Secondary | ICD-10-CM | POA: Diagnosis not present

## 2023-01-11 DIAGNOSIS — F331 Major depressive disorder, recurrent, moderate: Secondary | ICD-10-CM | POA: Diagnosis not present

## 2023-01-11 DIAGNOSIS — F419 Anxiety disorder, unspecified: Secondary | ICD-10-CM | POA: Diagnosis not present

## 2023-01-12 ENCOUNTER — Other Ambulatory Visit: Payer: Self-pay | Admitting: *Deleted

## 2023-01-12 NOTE — Patient Outreach (Signed)
[place] Coordinator follow up. Verified in [facility] Mr. Whidby discharged from [facility] on 01/11/23.   Confirmed with Kristy, [place] social worker, Mr. Jemmott transitioned to [facility].   No identifiable [facility] care coordination needs.  Marthenia Rolling, MSN, RN,BSN [place] Acute Care Coordinator [phone] (Direct dial)

## 2023-01-15 DIAGNOSIS — Z556 Problems related to health literacy: Secondary | ICD-10-CM | POA: Diagnosis not present

## 2023-01-15 DIAGNOSIS — K5904 Chronic idiopathic constipation: Secondary | ICD-10-CM | POA: Diagnosis not present

## 2023-01-15 DIAGNOSIS — N319 Neuromuscular dysfunction of bladder, unspecified: Secondary | ICD-10-CM | POA: Diagnosis not present

## 2023-01-15 DIAGNOSIS — I10 Essential (primary) hypertension: Secondary | ICD-10-CM | POA: Diagnosis not present

## 2023-01-15 DIAGNOSIS — G5601 Carpal tunnel syndrome, right upper limb: Secondary | ICD-10-CM | POA: Diagnosis not present

## 2023-01-15 DIAGNOSIS — K279 Peptic ulcer, site unspecified, unspecified as acute or chronic, without hemorrhage or perforation: Secondary | ICD-10-CM | POA: Diagnosis not present

## 2023-01-15 DIAGNOSIS — F411 Generalized anxiety disorder: Secondary | ICD-10-CM | POA: Diagnosis not present

## 2023-01-15 DIAGNOSIS — K626 Ulcer of anus and rectum: Secondary | ICD-10-CM | POA: Diagnosis not present

## 2023-01-15 DIAGNOSIS — G8221 Paraplegia, complete: Secondary | ICD-10-CM | POA: Diagnosis not present

## 2023-01-15 DIAGNOSIS — M109 Gout, unspecified: Secondary | ICD-10-CM | POA: Diagnosis not present

## 2023-01-15 DIAGNOSIS — F322 Major depressive disorder, single episode, severe without psychotic features: Secondary | ICD-10-CM | POA: Diagnosis not present

## 2023-01-15 DIAGNOSIS — Z9181 History of falling: Secondary | ICD-10-CM | POA: Diagnosis not present

## 2023-01-15 DIAGNOSIS — Z8546 Personal history of malignant neoplasm of prostate: Secondary | ICD-10-CM | POA: Diagnosis not present

## 2023-01-15 DIAGNOSIS — K219 Gastro-esophageal reflux disease without esophagitis: Secondary | ICD-10-CM | POA: Diagnosis not present

## 2023-01-15 DIAGNOSIS — Z87440 Personal history of urinary (tract) infections: Secondary | ICD-10-CM | POA: Diagnosis not present

## 2023-01-15 DIAGNOSIS — E559 Vitamin D deficiency, unspecified: Secondary | ICD-10-CM | POA: Diagnosis not present

## 2023-01-15 DIAGNOSIS — K589 Irritable bowel syndrome without diarrhea: Secondary | ICD-10-CM | POA: Diagnosis not present

## 2023-01-15 DIAGNOSIS — J309 Allergic rhinitis, unspecified: Secondary | ICD-10-CM | POA: Diagnosis not present

## 2023-01-15 DIAGNOSIS — H5319 Other subjective visual disturbances: Secondary | ICD-10-CM | POA: Diagnosis not present

## 2023-01-15 DIAGNOSIS — F4323 Adjustment disorder with mixed anxiety and depressed mood: Secondary | ICD-10-CM | POA: Diagnosis not present

## 2023-01-15 DIAGNOSIS — M545 Low back pain, unspecified: Secondary | ICD-10-CM | POA: Diagnosis not present

## 2023-01-15 DIAGNOSIS — Z7982 Long term (current) use of aspirin: Secondary | ICD-10-CM | POA: Diagnosis not present

## 2023-01-15 DIAGNOSIS — G4733 Obstructive sleep apnea (adult) (pediatric): Secondary | ICD-10-CM | POA: Diagnosis not present

## 2023-01-16 DIAGNOSIS — F411 Generalized anxiety disorder: Secondary | ICD-10-CM | POA: Diagnosis not present

## 2023-01-16 DIAGNOSIS — K279 Peptic ulcer, site unspecified, unspecified as acute or chronic, without hemorrhage or perforation: Secondary | ICD-10-CM | POA: Diagnosis not present

## 2023-01-16 DIAGNOSIS — I10 Essential (primary) hypertension: Secondary | ICD-10-CM | POA: Diagnosis not present

## 2023-01-16 DIAGNOSIS — F4323 Adjustment disorder with mixed anxiety and depressed mood: Secondary | ICD-10-CM | POA: Diagnosis not present

## 2023-01-16 DIAGNOSIS — F322 Major depressive disorder, single episode, severe without psychotic features: Secondary | ICD-10-CM | POA: Diagnosis not present

## 2023-01-16 DIAGNOSIS — G8221 Paraplegia, complete: Secondary | ICD-10-CM | POA: Diagnosis not present

## 2023-01-17 DIAGNOSIS — E663 Overweight: Secondary | ICD-10-CM | POA: Diagnosis not present

## 2023-01-17 DIAGNOSIS — G4733 Obstructive sleep apnea (adult) (pediatric): Secondary | ICD-10-CM | POA: Diagnosis not present

## 2023-01-17 DIAGNOSIS — F419 Anxiety disorder, unspecified: Secondary | ICD-10-CM | POA: Diagnosis not present

## 2023-01-17 DIAGNOSIS — R4189 Other symptoms and signs involving cognitive functions and awareness: Secondary | ICD-10-CM | POA: Diagnosis not present

## 2023-01-17 DIAGNOSIS — F329 Major depressive disorder, single episode, unspecified: Secondary | ICD-10-CM | POA: Diagnosis not present

## 2023-01-19 DIAGNOSIS — F4323 Adjustment disorder with mixed anxiety and depressed mood: Secondary | ICD-10-CM | POA: Diagnosis not present

## 2023-01-19 DIAGNOSIS — N390 Urinary tract infection, site not specified: Secondary | ICD-10-CM | POA: Diagnosis not present

## 2023-01-19 DIAGNOSIS — I10 Essential (primary) hypertension: Secondary | ICD-10-CM | POA: Diagnosis not present

## 2023-01-19 DIAGNOSIS — F411 Generalized anxiety disorder: Secondary | ICD-10-CM | POA: Diagnosis not present

## 2023-01-19 DIAGNOSIS — G8221 Paraplegia, complete: Secondary | ICD-10-CM | POA: Diagnosis not present

## 2023-01-19 DIAGNOSIS — F322 Major depressive disorder, single episode, severe without psychotic features: Secondary | ICD-10-CM | POA: Diagnosis not present

## 2023-01-19 DIAGNOSIS — K279 Peptic ulcer, site unspecified, unspecified as acute or chronic, without hemorrhage or perforation: Secondary | ICD-10-CM | POA: Diagnosis not present

## 2023-01-23 DIAGNOSIS — G8221 Paraplegia, complete: Secondary | ICD-10-CM | POA: Diagnosis not present

## 2023-01-23 DIAGNOSIS — F4323 Adjustment disorder with mixed anxiety and depressed mood: Secondary | ICD-10-CM | POA: Diagnosis not present

## 2023-01-23 DIAGNOSIS — K279 Peptic ulcer, site unspecified, unspecified as acute or chronic, without hemorrhage or perforation: Secondary | ICD-10-CM | POA: Diagnosis not present

## 2023-01-23 DIAGNOSIS — F322 Major depressive disorder, single episode, severe without psychotic features: Secondary | ICD-10-CM | POA: Diagnosis not present

## 2023-01-23 DIAGNOSIS — F411 Generalized anxiety disorder: Secondary | ICD-10-CM | POA: Diagnosis not present

## 2023-01-23 DIAGNOSIS — I10 Essential (primary) hypertension: Secondary | ICD-10-CM | POA: Diagnosis not present

## 2023-01-24 DIAGNOSIS — F322 Major depressive disorder, single episode, severe without psychotic features: Secondary | ICD-10-CM | POA: Diagnosis not present

## 2023-01-24 DIAGNOSIS — F411 Generalized anxiety disorder: Secondary | ICD-10-CM | POA: Diagnosis not present

## 2023-01-24 DIAGNOSIS — F4323 Adjustment disorder with mixed anxiety and depressed mood: Secondary | ICD-10-CM | POA: Diagnosis not present

## 2023-01-24 DIAGNOSIS — K279 Peptic ulcer, site unspecified, unspecified as acute or chronic, without hemorrhage or perforation: Secondary | ICD-10-CM | POA: Diagnosis not present

## 2023-01-24 DIAGNOSIS — G8221 Paraplegia, complete: Secondary | ICD-10-CM | POA: Diagnosis not present

## 2023-01-24 DIAGNOSIS — I10 Essential (primary) hypertension: Secondary | ICD-10-CM | POA: Diagnosis not present

## 2023-01-25 DIAGNOSIS — F411 Generalized anxiety disorder: Secondary | ICD-10-CM | POA: Diagnosis not present

## 2023-01-25 DIAGNOSIS — F322 Major depressive disorder, single episode, severe without psychotic features: Secondary | ICD-10-CM | POA: Diagnosis not present

## 2023-01-25 DIAGNOSIS — G8221 Paraplegia, complete: Secondary | ICD-10-CM | POA: Diagnosis not present

## 2023-01-25 DIAGNOSIS — F4323 Adjustment disorder with mixed anxiety and depressed mood: Secondary | ICD-10-CM | POA: Diagnosis not present

## 2023-01-25 DIAGNOSIS — I10 Essential (primary) hypertension: Secondary | ICD-10-CM | POA: Diagnosis not present

## 2023-01-25 DIAGNOSIS — K279 Peptic ulcer, site unspecified, unspecified as acute or chronic, without hemorrhage or perforation: Secondary | ICD-10-CM | POA: Diagnosis not present

## 2023-01-26 DIAGNOSIS — F4323 Adjustment disorder with mixed anxiety and depressed mood: Secondary | ICD-10-CM | POA: Diagnosis not present

## 2023-01-26 DIAGNOSIS — G8221 Paraplegia, complete: Secondary | ICD-10-CM | POA: Diagnosis not present

## 2023-01-26 DIAGNOSIS — I10 Essential (primary) hypertension: Secondary | ICD-10-CM | POA: Diagnosis not present

## 2023-01-26 DIAGNOSIS — K279 Peptic ulcer, site unspecified, unspecified as acute or chronic, without hemorrhage or perforation: Secondary | ICD-10-CM | POA: Diagnosis not present

## 2023-01-26 DIAGNOSIS — F322 Major depressive disorder, single episode, severe without psychotic features: Secondary | ICD-10-CM | POA: Diagnosis not present

## 2023-01-26 DIAGNOSIS — F411 Generalized anxiety disorder: Secondary | ICD-10-CM | POA: Diagnosis not present

## 2023-01-31 DIAGNOSIS — I10 Essential (primary) hypertension: Secondary | ICD-10-CM | POA: Diagnosis not present

## 2023-01-31 DIAGNOSIS — G8221 Paraplegia, complete: Secondary | ICD-10-CM | POA: Diagnosis not present

## 2023-01-31 DIAGNOSIS — F4323 Adjustment disorder with mixed anxiety and depressed mood: Secondary | ICD-10-CM | POA: Diagnosis not present

## 2023-01-31 DIAGNOSIS — K279 Peptic ulcer, site unspecified, unspecified as acute or chronic, without hemorrhage or perforation: Secondary | ICD-10-CM | POA: Diagnosis not present

## 2023-01-31 DIAGNOSIS — F411 Generalized anxiety disorder: Secondary | ICD-10-CM | POA: Diagnosis not present

## 2023-01-31 DIAGNOSIS — F322 Major depressive disorder, single episode, severe without psychotic features: Secondary | ICD-10-CM | POA: Diagnosis not present

## 2023-02-01 DIAGNOSIS — F4323 Adjustment disorder with mixed anxiety and depressed mood: Secondary | ICD-10-CM | POA: Diagnosis not present

## 2023-02-01 DIAGNOSIS — I10 Essential (primary) hypertension: Secondary | ICD-10-CM | POA: Diagnosis not present

## 2023-02-01 DIAGNOSIS — F322 Major depressive disorder, single episode, severe without psychotic features: Secondary | ICD-10-CM | POA: Diagnosis not present

## 2023-02-01 DIAGNOSIS — G8221 Paraplegia, complete: Secondary | ICD-10-CM | POA: Diagnosis not present

## 2023-02-01 DIAGNOSIS — K279 Peptic ulcer, site unspecified, unspecified as acute or chronic, without hemorrhage or perforation: Secondary | ICD-10-CM | POA: Diagnosis not present

## 2023-02-01 DIAGNOSIS — F411 Generalized anxiety disorder: Secondary | ICD-10-CM | POA: Diagnosis not present

## 2023-02-02 ENCOUNTER — Ambulatory Visit: Payer: Medicare Other | Admitting: Internal Medicine

## 2023-02-05 DIAGNOSIS — F322 Major depressive disorder, single episode, severe without psychotic features: Secondary | ICD-10-CM | POA: Diagnosis not present

## 2023-02-05 DIAGNOSIS — F411 Generalized anxiety disorder: Secondary | ICD-10-CM | POA: Diagnosis not present

## 2023-02-05 DIAGNOSIS — G8221 Paraplegia, complete: Secondary | ICD-10-CM | POA: Diagnosis not present

## 2023-02-05 DIAGNOSIS — F4323 Adjustment disorder with mixed anxiety and depressed mood: Secondary | ICD-10-CM | POA: Diagnosis not present

## 2023-02-05 DIAGNOSIS — I10 Essential (primary) hypertension: Secondary | ICD-10-CM | POA: Diagnosis not present

## 2023-02-05 DIAGNOSIS — K279 Peptic ulcer, site unspecified, unspecified as acute or chronic, without hemorrhage or perforation: Secondary | ICD-10-CM | POA: Diagnosis not present

## 2023-02-07 DIAGNOSIS — F322 Major depressive disorder, single episode, severe without psychotic features: Secondary | ICD-10-CM | POA: Diagnosis not present

## 2023-02-07 DIAGNOSIS — K279 Peptic ulcer, site unspecified, unspecified as acute or chronic, without hemorrhage or perforation: Secondary | ICD-10-CM | POA: Diagnosis not present

## 2023-02-07 DIAGNOSIS — I10 Essential (primary) hypertension: Secondary | ICD-10-CM | POA: Diagnosis not present

## 2023-02-07 DIAGNOSIS — F4323 Adjustment disorder with mixed anxiety and depressed mood: Secondary | ICD-10-CM | POA: Diagnosis not present

## 2023-02-07 DIAGNOSIS — F411 Generalized anxiety disorder: Secondary | ICD-10-CM | POA: Diagnosis not present

## 2023-02-07 DIAGNOSIS — G8221 Paraplegia, complete: Secondary | ICD-10-CM | POA: Diagnosis not present

## 2023-02-13 DIAGNOSIS — F411 Generalized anxiety disorder: Secondary | ICD-10-CM | POA: Diagnosis not present

## 2023-02-13 DIAGNOSIS — K279 Peptic ulcer, site unspecified, unspecified as acute or chronic, without hemorrhage or perforation: Secondary | ICD-10-CM | POA: Diagnosis not present

## 2023-02-13 DIAGNOSIS — F322 Major depressive disorder, single episode, severe without psychotic features: Secondary | ICD-10-CM | POA: Diagnosis not present

## 2023-02-13 DIAGNOSIS — F4323 Adjustment disorder with mixed anxiety and depressed mood: Secondary | ICD-10-CM | POA: Diagnosis not present

## 2023-02-13 DIAGNOSIS — I10 Essential (primary) hypertension: Secondary | ICD-10-CM | POA: Diagnosis not present

## 2023-02-13 DIAGNOSIS — G8221 Paraplegia, complete: Secondary | ICD-10-CM | POA: Diagnosis not present

## 2023-02-14 DIAGNOSIS — G4733 Obstructive sleep apnea (adult) (pediatric): Secondary | ICD-10-CM | POA: Diagnosis not present

## 2023-02-14 DIAGNOSIS — G5601 Carpal tunnel syndrome, right upper limb: Secondary | ICD-10-CM | POA: Diagnosis not present

## 2023-02-14 DIAGNOSIS — K5904 Chronic idiopathic constipation: Secondary | ICD-10-CM | POA: Diagnosis not present

## 2023-02-14 DIAGNOSIS — K219 Gastro-esophageal reflux disease without esophagitis: Secondary | ICD-10-CM | POA: Diagnosis not present

## 2023-02-14 DIAGNOSIS — K626 Ulcer of anus and rectum: Secondary | ICD-10-CM | POA: Diagnosis not present

## 2023-02-14 DIAGNOSIS — Z87440 Personal history of urinary (tract) infections: Secondary | ICD-10-CM | POA: Diagnosis not present

## 2023-02-14 DIAGNOSIS — E559 Vitamin D deficiency, unspecified: Secondary | ICD-10-CM | POA: Diagnosis not present

## 2023-02-14 DIAGNOSIS — F322 Major depressive disorder, single episode, severe without psychotic features: Secondary | ICD-10-CM | POA: Diagnosis not present

## 2023-02-14 DIAGNOSIS — Z7982 Long term (current) use of aspirin: Secondary | ICD-10-CM | POA: Diagnosis not present

## 2023-02-14 DIAGNOSIS — M545 Low back pain, unspecified: Secondary | ICD-10-CM | POA: Diagnosis not present

## 2023-02-14 DIAGNOSIS — M109 Gout, unspecified: Secondary | ICD-10-CM | POA: Diagnosis not present

## 2023-02-14 DIAGNOSIS — K589 Irritable bowel syndrome without diarrhea: Secondary | ICD-10-CM | POA: Diagnosis not present

## 2023-02-14 DIAGNOSIS — Z556 Problems related to health literacy: Secondary | ICD-10-CM | POA: Diagnosis not present

## 2023-02-14 DIAGNOSIS — K279 Peptic ulcer, site unspecified, unspecified as acute or chronic, without hemorrhage or perforation: Secondary | ICD-10-CM | POA: Diagnosis not present

## 2023-02-14 DIAGNOSIS — Z9181 History of falling: Secondary | ICD-10-CM | POA: Diagnosis not present

## 2023-02-14 DIAGNOSIS — I10 Essential (primary) hypertension: Secondary | ICD-10-CM | POA: Diagnosis not present

## 2023-02-14 DIAGNOSIS — F411 Generalized anxiety disorder: Secondary | ICD-10-CM | POA: Diagnosis not present

## 2023-02-14 DIAGNOSIS — F4323 Adjustment disorder with mixed anxiety and depressed mood: Secondary | ICD-10-CM | POA: Diagnosis not present

## 2023-02-14 DIAGNOSIS — H5319 Other subjective visual disturbances: Secondary | ICD-10-CM | POA: Diagnosis not present

## 2023-02-14 DIAGNOSIS — G8221 Paraplegia, complete: Secondary | ICD-10-CM | POA: Diagnosis not present

## 2023-02-14 DIAGNOSIS — Z8546 Personal history of malignant neoplasm of prostate: Secondary | ICD-10-CM | POA: Diagnosis not present

## 2023-02-14 DIAGNOSIS — N319 Neuromuscular dysfunction of bladder, unspecified: Secondary | ICD-10-CM | POA: Diagnosis not present

## 2023-02-14 DIAGNOSIS — J309 Allergic rhinitis, unspecified: Secondary | ICD-10-CM | POA: Diagnosis not present

## 2023-02-16 DIAGNOSIS — F4323 Adjustment disorder with mixed anxiety and depressed mood: Secondary | ICD-10-CM | POA: Diagnosis not present

## 2023-02-16 DIAGNOSIS — F322 Major depressive disorder, single episode, severe without psychotic features: Secondary | ICD-10-CM | POA: Diagnosis not present

## 2023-02-16 DIAGNOSIS — K279 Peptic ulcer, site unspecified, unspecified as acute or chronic, without hemorrhage or perforation: Secondary | ICD-10-CM | POA: Diagnosis not present

## 2023-02-16 DIAGNOSIS — G8221 Paraplegia, complete: Secondary | ICD-10-CM | POA: Diagnosis not present

## 2023-02-16 DIAGNOSIS — I10 Essential (primary) hypertension: Secondary | ICD-10-CM | POA: Diagnosis not present

## 2023-02-16 DIAGNOSIS — F411 Generalized anxiety disorder: Secondary | ICD-10-CM | POA: Diagnosis not present

## 2023-02-19 DIAGNOSIS — K279 Peptic ulcer, site unspecified, unspecified as acute or chronic, without hemorrhage or perforation: Secondary | ICD-10-CM | POA: Diagnosis not present

## 2023-02-19 DIAGNOSIS — I10 Essential (primary) hypertension: Secondary | ICD-10-CM | POA: Diagnosis not present

## 2023-02-19 DIAGNOSIS — G8221 Paraplegia, complete: Secondary | ICD-10-CM | POA: Diagnosis not present

## 2023-02-19 DIAGNOSIS — F322 Major depressive disorder, single episode, severe without psychotic features: Secondary | ICD-10-CM | POA: Diagnosis not present

## 2023-02-19 DIAGNOSIS — F4323 Adjustment disorder with mixed anxiety and depressed mood: Secondary | ICD-10-CM | POA: Diagnosis not present

## 2023-02-19 DIAGNOSIS — F411 Generalized anxiety disorder: Secondary | ICD-10-CM | POA: Diagnosis not present

## 2023-02-20 DIAGNOSIS — K279 Peptic ulcer, site unspecified, unspecified as acute or chronic, without hemorrhage or perforation: Secondary | ICD-10-CM | POA: Diagnosis not present

## 2023-02-20 DIAGNOSIS — F411 Generalized anxiety disorder: Secondary | ICD-10-CM | POA: Diagnosis not present

## 2023-02-20 DIAGNOSIS — G8221 Paraplegia, complete: Secondary | ICD-10-CM | POA: Diagnosis not present

## 2023-02-20 DIAGNOSIS — I10 Essential (primary) hypertension: Secondary | ICD-10-CM | POA: Diagnosis not present

## 2023-02-20 DIAGNOSIS — F4323 Adjustment disorder with mixed anxiety and depressed mood: Secondary | ICD-10-CM | POA: Diagnosis not present

## 2023-02-20 DIAGNOSIS — F322 Major depressive disorder, single episode, severe without psychotic features: Secondary | ICD-10-CM | POA: Diagnosis not present

## 2023-02-26 DIAGNOSIS — M25561 Pain in right knee: Secondary | ICD-10-CM | POA: Diagnosis not present

## 2023-02-26 DIAGNOSIS — M79661 Pain in right lower leg: Secondary | ICD-10-CM | POA: Diagnosis not present

## 2023-02-26 DIAGNOSIS — M79651 Pain in right thigh: Secondary | ICD-10-CM | POA: Diagnosis not present

## 2023-02-27 DIAGNOSIS — F4323 Adjustment disorder with mixed anxiety and depressed mood: Secondary | ICD-10-CM | POA: Diagnosis not present

## 2023-02-27 DIAGNOSIS — F322 Major depressive disorder, single episode, severe without psychotic features: Secondary | ICD-10-CM | POA: Diagnosis not present

## 2023-02-27 DIAGNOSIS — F411 Generalized anxiety disorder: Secondary | ICD-10-CM | POA: Diagnosis not present

## 2023-02-27 DIAGNOSIS — K279 Peptic ulcer, site unspecified, unspecified as acute or chronic, without hemorrhage or perforation: Secondary | ICD-10-CM | POA: Diagnosis not present

## 2023-02-27 DIAGNOSIS — G8221 Paraplegia, complete: Secondary | ICD-10-CM | POA: Diagnosis not present

## 2023-02-27 DIAGNOSIS — I10 Essential (primary) hypertension: Secondary | ICD-10-CM | POA: Diagnosis not present

## 2023-03-02 DIAGNOSIS — I10 Essential (primary) hypertension: Secondary | ICD-10-CM | POA: Diagnosis not present

## 2023-03-02 DIAGNOSIS — F322 Major depressive disorder, single episode, severe without psychotic features: Secondary | ICD-10-CM | POA: Diagnosis not present

## 2023-03-02 DIAGNOSIS — F411 Generalized anxiety disorder: Secondary | ICD-10-CM | POA: Diagnosis not present

## 2023-03-02 DIAGNOSIS — F4323 Adjustment disorder with mixed anxiety and depressed mood: Secondary | ICD-10-CM | POA: Diagnosis not present

## 2023-03-02 DIAGNOSIS — K279 Peptic ulcer, site unspecified, unspecified as acute or chronic, without hemorrhage or perforation: Secondary | ICD-10-CM | POA: Diagnosis not present

## 2023-03-02 DIAGNOSIS — G8221 Paraplegia, complete: Secondary | ICD-10-CM | POA: Diagnosis not present

## 2023-03-06 DIAGNOSIS — G8221 Paraplegia, complete: Secondary | ICD-10-CM | POA: Diagnosis not present

## 2023-03-06 DIAGNOSIS — K279 Peptic ulcer, site unspecified, unspecified as acute or chronic, without hemorrhage or perforation: Secondary | ICD-10-CM | POA: Diagnosis not present

## 2023-03-06 DIAGNOSIS — F4323 Adjustment disorder with mixed anxiety and depressed mood: Secondary | ICD-10-CM | POA: Diagnosis not present

## 2023-03-06 DIAGNOSIS — I10 Essential (primary) hypertension: Secondary | ICD-10-CM | POA: Diagnosis not present

## 2023-03-06 DIAGNOSIS — F411 Generalized anxiety disorder: Secondary | ICD-10-CM | POA: Diagnosis not present

## 2023-03-06 DIAGNOSIS — F322 Major depressive disorder, single episode, severe without psychotic features: Secondary | ICD-10-CM | POA: Diagnosis not present

## 2023-03-09 DIAGNOSIS — K279 Peptic ulcer, site unspecified, unspecified as acute or chronic, without hemorrhage or perforation: Secondary | ICD-10-CM | POA: Diagnosis not present

## 2023-03-09 DIAGNOSIS — F411 Generalized anxiety disorder: Secondary | ICD-10-CM | POA: Diagnosis not present

## 2023-03-09 DIAGNOSIS — G8221 Paraplegia, complete: Secondary | ICD-10-CM | POA: Diagnosis not present

## 2023-03-09 DIAGNOSIS — F322 Major depressive disorder, single episode, severe without psychotic features: Secondary | ICD-10-CM | POA: Diagnosis not present

## 2023-03-09 DIAGNOSIS — I10 Essential (primary) hypertension: Secondary | ICD-10-CM | POA: Diagnosis not present

## 2023-03-09 DIAGNOSIS — F4323 Adjustment disorder with mixed anxiety and depressed mood: Secondary | ICD-10-CM | POA: Diagnosis not present

## 2023-03-12 DIAGNOSIS — G8221 Paraplegia, complete: Secondary | ICD-10-CM | POA: Diagnosis not present

## 2023-03-12 DIAGNOSIS — F322 Major depressive disorder, single episode, severe without psychotic features: Secondary | ICD-10-CM | POA: Diagnosis not present

## 2023-03-12 DIAGNOSIS — I10 Essential (primary) hypertension: Secondary | ICD-10-CM | POA: Diagnosis not present

## 2023-03-12 DIAGNOSIS — F411 Generalized anxiety disorder: Secondary | ICD-10-CM | POA: Diagnosis not present

## 2023-03-12 DIAGNOSIS — K279 Peptic ulcer, site unspecified, unspecified as acute or chronic, without hemorrhage or perforation: Secondary | ICD-10-CM | POA: Diagnosis not present

## 2023-03-12 DIAGNOSIS — F4323 Adjustment disorder with mixed anxiety and depressed mood: Secondary | ICD-10-CM | POA: Diagnosis not present

## 2023-03-13 DIAGNOSIS — F322 Major depressive disorder, single episode, severe without psychotic features: Secondary | ICD-10-CM | POA: Diagnosis not present

## 2023-03-13 DIAGNOSIS — I10 Essential (primary) hypertension: Secondary | ICD-10-CM | POA: Diagnosis not present

## 2023-03-13 DIAGNOSIS — K279 Peptic ulcer, site unspecified, unspecified as acute or chronic, without hemorrhage or perforation: Secondary | ICD-10-CM | POA: Diagnosis not present

## 2023-03-13 DIAGNOSIS — G8221 Paraplegia, complete: Secondary | ICD-10-CM | POA: Diagnosis not present

## 2023-03-13 DIAGNOSIS — F4323 Adjustment disorder with mixed anxiety and depressed mood: Secondary | ICD-10-CM | POA: Diagnosis not present

## 2023-03-13 DIAGNOSIS — F411 Generalized anxiety disorder: Secondary | ICD-10-CM | POA: Diagnosis not present

## 2023-03-16 DIAGNOSIS — K5904 Chronic idiopathic constipation: Secondary | ICD-10-CM | POA: Diagnosis not present

## 2023-03-16 DIAGNOSIS — H5319 Other subjective visual disturbances: Secondary | ICD-10-CM | POA: Diagnosis not present

## 2023-03-16 DIAGNOSIS — J309 Allergic rhinitis, unspecified: Secondary | ICD-10-CM | POA: Diagnosis not present

## 2023-03-16 DIAGNOSIS — G4733 Obstructive sleep apnea (adult) (pediatric): Secondary | ICD-10-CM | POA: Diagnosis not present

## 2023-03-16 DIAGNOSIS — G5601 Carpal tunnel syndrome, right upper limb: Secondary | ICD-10-CM | POA: Diagnosis not present

## 2023-03-16 DIAGNOSIS — N319 Neuromuscular dysfunction of bladder, unspecified: Secondary | ICD-10-CM | POA: Diagnosis not present

## 2023-03-16 DIAGNOSIS — M545 Low back pain, unspecified: Secondary | ICD-10-CM | POA: Diagnosis not present

## 2023-03-16 DIAGNOSIS — M109 Gout, unspecified: Secondary | ICD-10-CM | POA: Diagnosis not present

## 2023-03-16 DIAGNOSIS — Z556 Problems related to health literacy: Secondary | ICD-10-CM | POA: Diagnosis not present

## 2023-03-16 DIAGNOSIS — F4323 Adjustment disorder with mixed anxiety and depressed mood: Secondary | ICD-10-CM | POA: Diagnosis not present

## 2023-03-16 DIAGNOSIS — F411 Generalized anxiety disorder: Secondary | ICD-10-CM | POA: Diagnosis not present

## 2023-03-16 DIAGNOSIS — L89152 Pressure ulcer of sacral region, stage 2: Secondary | ICD-10-CM | POA: Diagnosis not present

## 2023-03-16 DIAGNOSIS — G8221 Paraplegia, complete: Secondary | ICD-10-CM | POA: Diagnosis not present

## 2023-03-16 DIAGNOSIS — K279 Peptic ulcer, site unspecified, unspecified as acute or chronic, without hemorrhage or perforation: Secondary | ICD-10-CM | POA: Diagnosis not present

## 2023-03-16 DIAGNOSIS — Z7982 Long term (current) use of aspirin: Secondary | ICD-10-CM | POA: Diagnosis not present

## 2023-03-16 DIAGNOSIS — K626 Ulcer of anus and rectum: Secondary | ICD-10-CM | POA: Diagnosis not present

## 2023-03-16 DIAGNOSIS — K589 Irritable bowel syndrome without diarrhea: Secondary | ICD-10-CM | POA: Diagnosis not present

## 2023-03-16 DIAGNOSIS — Z8546 Personal history of malignant neoplasm of prostate: Secondary | ICD-10-CM | POA: Diagnosis not present

## 2023-03-16 DIAGNOSIS — I10 Essential (primary) hypertension: Secondary | ICD-10-CM | POA: Diagnosis not present

## 2023-03-16 DIAGNOSIS — K219 Gastro-esophageal reflux disease without esophagitis: Secondary | ICD-10-CM | POA: Diagnosis not present

## 2023-03-16 DIAGNOSIS — F332 Major depressive disorder, recurrent severe without psychotic features: Secondary | ICD-10-CM | POA: Diagnosis not present

## 2023-03-16 DIAGNOSIS — Z87440 Personal history of urinary (tract) infections: Secondary | ICD-10-CM | POA: Diagnosis not present

## 2023-03-16 DIAGNOSIS — L89622 Pressure ulcer of left heel, stage 2: Secondary | ICD-10-CM | POA: Diagnosis not present

## 2023-03-16 DIAGNOSIS — E559 Vitamin D deficiency, unspecified: Secondary | ICD-10-CM | POA: Diagnosis not present

## 2023-03-16 DIAGNOSIS — Z993 Dependence on wheelchair: Secondary | ICD-10-CM | POA: Diagnosis not present

## 2023-03-19 DIAGNOSIS — F411 Generalized anxiety disorder: Secondary | ICD-10-CM | POA: Diagnosis not present

## 2023-03-19 DIAGNOSIS — G8221 Paraplegia, complete: Secondary | ICD-10-CM | POA: Diagnosis not present

## 2023-03-19 DIAGNOSIS — I10 Essential (primary) hypertension: Secondary | ICD-10-CM | POA: Diagnosis not present

## 2023-03-19 DIAGNOSIS — R4189 Other symptoms and signs involving cognitive functions and awareness: Secondary | ICD-10-CM | POA: Diagnosis not present

## 2023-03-19 DIAGNOSIS — F419 Anxiety disorder, unspecified: Secondary | ICD-10-CM | POA: Diagnosis not present

## 2023-03-19 DIAGNOSIS — L89152 Pressure ulcer of sacral region, stage 2: Secondary | ICD-10-CM | POA: Diagnosis not present

## 2023-03-19 DIAGNOSIS — F329 Major depressive disorder, single episode, unspecified: Secondary | ICD-10-CM | POA: Diagnosis not present

## 2023-03-19 DIAGNOSIS — F332 Major depressive disorder, recurrent severe without psychotic features: Secondary | ICD-10-CM | POA: Diagnosis not present

## 2023-03-19 DIAGNOSIS — L89622 Pressure ulcer of left heel, stage 2: Secondary | ICD-10-CM | POA: Diagnosis not present

## 2023-03-21 DIAGNOSIS — F332 Major depressive disorder, recurrent severe without psychotic features: Secondary | ICD-10-CM | POA: Diagnosis not present

## 2023-03-21 DIAGNOSIS — I10 Essential (primary) hypertension: Secondary | ICD-10-CM | POA: Diagnosis not present

## 2023-03-21 DIAGNOSIS — F411 Generalized anxiety disorder: Secondary | ICD-10-CM | POA: Diagnosis not present

## 2023-03-21 DIAGNOSIS — G8221 Paraplegia, complete: Secondary | ICD-10-CM | POA: Diagnosis not present

## 2023-03-21 DIAGNOSIS — L89622 Pressure ulcer of left heel, stage 2: Secondary | ICD-10-CM | POA: Diagnosis not present

## 2023-03-21 DIAGNOSIS — L89152 Pressure ulcer of sacral region, stage 2: Secondary | ICD-10-CM | POA: Diagnosis not present

## 2023-03-21 NOTE — Progress Notes (Deleted)
Subjective:    Patient ID: David Kim, male    DOB: December 22, 1945, 77 y.o.   MRN: [id number]  HPI   male followed for OSA/Insomnia, complicated by paraplegia after excision spinal cord tumor, GERD, HBP, GERD NPSG 2003, AHI 31 / hr -----------------------------------------------------------------------------------------   03/20/22- 77 year old male never smoker followed for OSA /Insomnia, complicated by Paraplegia/ Spastic L hemiparesis  after spinal tumor resected, Seizure, GERD, HBP,  -Clonazepam 2 mg x 2(4 mg) hs,  CPAP  auto 5-15/ [employer] Download-compliance 100%, AHI 2/ hr Body weight today- Covid vax-2 Phizer Flu vax- had -----Patient is doing good, no concerns No specific problems.  He sometimes wakes to take medication during the night.  Clonazepam is provided by his physiatrist.  He thinks he sometimes does not get enough sleep but admits he can adjust when he goes to bed, when he gets up and whether or not to nap. Finally got replacement wheelchair which works better for him.  03/22/23- 77 year old male never smoker followed for OSA /Insomnia, complicated by Paraplegia/ Spastic L hemiparesis  after spinal tumor resected, Seizure, GERD, HBP, Depression,  -Clonazepam 0.5 mg two times daily- do not exceed. Being taken to avoid withdrawal effects. CPAP  auto 5-15/ [employer] Download-compliance  Body weight today- Hosp January for intentional overdose, complicated by sepsis, metabolic encephalopathy. Clonazepam was one of drugs taken. Benzo withdrawal noted, so he is now on fixed dose clonazepam 0.5 mg bid.  CXR 10/19/22 MPRESSION: Worsened aeration of the left base with a suspected small pleural effusion may reflect developing infection in the correct clinical setting. Consider upright PA and lateral chest radiographs for better evaluation as indicated.  Review of Systems   + = positive Constitutional: Negative for fever and unexpected weight change.  HENT: Negative for  congestion, dental problem, ear pain, nosebleeds, postnasal drip, rhinorrhea, sinus pressure, sneezing, sore throat and trouble swallowing.   Eyes: Negative for redness and itching.  Respiratory: Negative for cough, chest tightness, shortness of breath and wheezing.   Cardiovascular: Negative for palpitations, + leg swelling.  Gastrointestinal: Negative for nausea and vomiting.  Genitourinary: Negative for dysuria.  Musculoskeletal: Negative for joint swelling.  Skin: Negative for rash.  Neurological: +per HPI Hematological: Does not bruise/bleed easily.  Psychiatric/Behavioral: Negative for dysphoric mood. The patient is not nervous/anxious.    Objective:  OBJ- Physical Exam General- Alert, Oriented, Affect-appropriate, Distress- none acute, + Wheelchair, + obese Skin- rash-none, lesions- none, excoriation- none Lymphadenopathy- none Head- atraumatic            Eyes- Gross vision intact, PERRLA, conjunctivae and secretions clear            Ears- Hearing, canals-normal            Nose- Clear, no-Septal dev, mucus, polyps, erosion, perforation             Throat- Mallampati II , mucosa clear , drainage- none, tonsils- atrophic, + missing teeth                   neck- flexible , trachea midline, no stridor , thyroid nl, carotid no bruit Chest - symmetrical excursion , unlabored           Heart/CV- RRR , no murmur , no gallop  , no rub, nl s1 s2                           - JVD- none , edema-+trace, R>L, stasis changes- none,  varices- none           Lung- clear to P&A, wheeze- none, cough- none , dullness-none, rub- none           Chest wall-  Abd-  Br/ Gen/ Rectal- Not done, not indicated Extrem- cyanosis- none, clubbing, none, atrophy- none, strength- nl Neuro- +paraplegic L   Jetty Duhamel, MD

## 2023-03-22 ENCOUNTER — Ambulatory Visit: Payer: Medicare Other | Admitting: Internal Medicine

## 2023-03-27 DIAGNOSIS — I10 Essential (primary) hypertension: Secondary | ICD-10-CM | POA: Diagnosis not present

## 2023-03-27 DIAGNOSIS — L89622 Pressure ulcer of left heel, stage 2: Secondary | ICD-10-CM | POA: Diagnosis not present

## 2023-03-27 DIAGNOSIS — G8221 Paraplegia, complete: Secondary | ICD-10-CM | POA: Diagnosis not present

## 2023-03-27 DIAGNOSIS — F332 Major depressive disorder, recurrent severe without psychotic features: Secondary | ICD-10-CM | POA: Diagnosis not present

## 2023-03-27 DIAGNOSIS — L89152 Pressure ulcer of sacral region, stage 2: Secondary | ICD-10-CM | POA: Diagnosis not present

## 2023-03-27 DIAGNOSIS — F411 Generalized anxiety disorder: Secondary | ICD-10-CM | POA: Diagnosis not present

## 2023-03-30 ENCOUNTER — Encounter: Payer: Self-pay | Admitting: Internal Medicine

## 2023-04-02 DIAGNOSIS — L89622 Pressure ulcer of left heel, stage 2: Secondary | ICD-10-CM | POA: Diagnosis not present

## 2023-04-02 DIAGNOSIS — I10 Essential (primary) hypertension: Secondary | ICD-10-CM | POA: Diagnosis not present

## 2023-04-02 DIAGNOSIS — F411 Generalized anxiety disorder: Secondary | ICD-10-CM | POA: Diagnosis not present

## 2023-04-02 DIAGNOSIS — F332 Major depressive disorder, recurrent severe without psychotic features: Secondary | ICD-10-CM | POA: Diagnosis not present

## 2023-04-02 DIAGNOSIS — G8221 Paraplegia, complete: Secondary | ICD-10-CM | POA: Diagnosis not present

## 2023-04-02 DIAGNOSIS — L89152 Pressure ulcer of sacral region, stage 2: Secondary | ICD-10-CM | POA: Diagnosis not present

## 2023-04-04 ENCOUNTER — Encounter: Payer: Medicare Other | Attending: Physical Medicine & Rehabilitation | Admitting: Physical Medicine & Rehabilitation

## 2023-04-04 DIAGNOSIS — L89622 Pressure ulcer of left heel, stage 2: Secondary | ICD-10-CM | POA: Diagnosis not present

## 2023-04-04 DIAGNOSIS — F332 Major depressive disorder, recurrent severe without psychotic features: Secondary | ICD-10-CM | POA: Diagnosis not present

## 2023-04-04 DIAGNOSIS — I10 Essential (primary) hypertension: Secondary | ICD-10-CM | POA: Diagnosis not present

## 2023-04-04 DIAGNOSIS — L89152 Pressure ulcer of sacral region, stage 2: Secondary | ICD-10-CM | POA: Diagnosis not present

## 2023-04-04 DIAGNOSIS — F411 Generalized anxiety disorder: Secondary | ICD-10-CM | POA: Diagnosis not present

## 2023-04-04 DIAGNOSIS — G8221 Paraplegia, complete: Secondary | ICD-10-CM | POA: Diagnosis not present

## 2023-04-09 DIAGNOSIS — L89622 Pressure ulcer of left heel, stage 2: Secondary | ICD-10-CM | POA: Diagnosis not present

## 2023-04-09 DIAGNOSIS — F411 Generalized anxiety disorder: Secondary | ICD-10-CM | POA: Diagnosis not present

## 2023-04-09 DIAGNOSIS — F419 Anxiety disorder, unspecified: Secondary | ICD-10-CM | POA: Diagnosis not present

## 2023-04-09 DIAGNOSIS — I10 Essential (primary) hypertension: Secondary | ICD-10-CM | POA: Diagnosis not present

## 2023-04-09 DIAGNOSIS — F332 Major depressive disorder, recurrent severe without psychotic features: Secondary | ICD-10-CM | POA: Diagnosis not present

## 2023-04-09 DIAGNOSIS — G8221 Paraplegia, complete: Secondary | ICD-10-CM | POA: Diagnosis not present

## 2023-04-09 DIAGNOSIS — R4189 Other symptoms and signs involving cognitive functions and awareness: Secondary | ICD-10-CM | POA: Diagnosis not present

## 2023-04-09 DIAGNOSIS — L89152 Pressure ulcer of sacral region, stage 2: Secondary | ICD-10-CM | POA: Diagnosis not present

## 2023-04-09 DIAGNOSIS — F329 Major depressive disorder, single episode, unspecified: Secondary | ICD-10-CM | POA: Diagnosis not present

## 2023-04-15 DIAGNOSIS — G5601 Carpal tunnel syndrome, right upper limb: Secondary | ICD-10-CM | POA: Diagnosis not present

## 2023-04-15 DIAGNOSIS — Z7982 Long term (current) use of aspirin: Secondary | ICD-10-CM | POA: Diagnosis not present

## 2023-04-15 DIAGNOSIS — G8221 Paraplegia, complete: Secondary | ICD-10-CM | POA: Diagnosis not present

## 2023-04-15 DIAGNOSIS — K626 Ulcer of anus and rectum: Secondary | ICD-10-CM | POA: Diagnosis not present

## 2023-04-15 DIAGNOSIS — N319 Neuromuscular dysfunction of bladder, unspecified: Secondary | ICD-10-CM | POA: Diagnosis not present

## 2023-04-15 DIAGNOSIS — Z87440 Personal history of urinary (tract) infections: Secondary | ICD-10-CM | POA: Diagnosis not present

## 2023-04-15 DIAGNOSIS — Z8546 Personal history of malignant neoplasm of prostate: Secondary | ICD-10-CM | POA: Diagnosis not present

## 2023-04-15 DIAGNOSIS — H5319 Other subjective visual disturbances: Secondary | ICD-10-CM | POA: Diagnosis not present

## 2023-04-15 DIAGNOSIS — F4323 Adjustment disorder with mixed anxiety and depressed mood: Secondary | ICD-10-CM | POA: Diagnosis not present

## 2023-04-15 DIAGNOSIS — Z993 Dependence on wheelchair: Secondary | ICD-10-CM | POA: Diagnosis not present

## 2023-04-15 DIAGNOSIS — M545 Low back pain, unspecified: Secondary | ICD-10-CM | POA: Diagnosis not present

## 2023-04-15 DIAGNOSIS — J309 Allergic rhinitis, unspecified: Secondary | ICD-10-CM | POA: Diagnosis not present

## 2023-04-15 DIAGNOSIS — K279 Peptic ulcer, site unspecified, unspecified as acute or chronic, without hemorrhage or perforation: Secondary | ICD-10-CM | POA: Diagnosis not present

## 2023-04-15 DIAGNOSIS — F332 Major depressive disorder, recurrent severe without psychotic features: Secondary | ICD-10-CM | POA: Diagnosis not present

## 2023-04-15 DIAGNOSIS — G4733 Obstructive sleep apnea (adult) (pediatric): Secondary | ICD-10-CM | POA: Diagnosis not present

## 2023-04-15 DIAGNOSIS — L89622 Pressure ulcer of left heel, stage 2: Secondary | ICD-10-CM | POA: Diagnosis not present

## 2023-04-15 DIAGNOSIS — M109 Gout, unspecified: Secondary | ICD-10-CM | POA: Diagnosis not present

## 2023-04-15 DIAGNOSIS — E559 Vitamin D deficiency, unspecified: Secondary | ICD-10-CM | POA: Diagnosis not present

## 2023-04-15 DIAGNOSIS — I10 Essential (primary) hypertension: Secondary | ICD-10-CM | POA: Diagnosis not present

## 2023-04-15 DIAGNOSIS — L89152 Pressure ulcer of sacral region, stage 2: Secondary | ICD-10-CM | POA: Diagnosis not present

## 2023-04-15 DIAGNOSIS — K589 Irritable bowel syndrome without diarrhea: Secondary | ICD-10-CM | POA: Diagnosis not present

## 2023-04-15 DIAGNOSIS — K219 Gastro-esophageal reflux disease without esophagitis: Secondary | ICD-10-CM | POA: Diagnosis not present

## 2023-04-15 DIAGNOSIS — Z556 Problems related to health literacy: Secondary | ICD-10-CM | POA: Diagnosis not present

## 2023-04-15 DIAGNOSIS — K5904 Chronic idiopathic constipation: Secondary | ICD-10-CM | POA: Diagnosis not present

## 2023-04-15 DIAGNOSIS — F411 Generalized anxiety disorder: Secondary | ICD-10-CM | POA: Diagnosis not present

## 2023-04-18 DIAGNOSIS — M79671 Pain in right foot: Secondary | ICD-10-CM | POA: Diagnosis not present

## 2023-04-18 DIAGNOSIS — M79672 Pain in left foot: Secondary | ICD-10-CM | POA: Diagnosis not present

## 2023-04-18 DIAGNOSIS — L600 Ingrowing nail: Secondary | ICD-10-CM | POA: Diagnosis not present

## 2023-04-18 DIAGNOSIS — B351 Tinea unguium: Secondary | ICD-10-CM | POA: Diagnosis not present

## 2023-04-19 DIAGNOSIS — F332 Major depressive disorder, recurrent severe without psychotic features: Secondary | ICD-10-CM | POA: Diagnosis not present

## 2023-04-19 DIAGNOSIS — I10 Essential (primary) hypertension: Secondary | ICD-10-CM | POA: Diagnosis not present

## 2023-04-19 DIAGNOSIS — F411 Generalized anxiety disorder: Secondary | ICD-10-CM | POA: Diagnosis not present

## 2023-04-19 DIAGNOSIS — L89152 Pressure ulcer of sacral region, stage 2: Secondary | ICD-10-CM | POA: Diagnosis not present

## 2023-04-19 DIAGNOSIS — L89622 Pressure ulcer of left heel, stage 2: Secondary | ICD-10-CM | POA: Diagnosis not present

## 2023-04-19 DIAGNOSIS — G8221 Paraplegia, complete: Secondary | ICD-10-CM | POA: Diagnosis not present

## 2023-04-20 DIAGNOSIS — L89622 Pressure ulcer of left heel, stage 2: Secondary | ICD-10-CM | POA: Diagnosis not present

## 2023-04-20 DIAGNOSIS — G8221 Paraplegia, complete: Secondary | ICD-10-CM | POA: Diagnosis not present

## 2023-04-20 DIAGNOSIS — F411 Generalized anxiety disorder: Secondary | ICD-10-CM | POA: Diagnosis not present

## 2023-04-20 DIAGNOSIS — I10 Essential (primary) hypertension: Secondary | ICD-10-CM | POA: Diagnosis not present

## 2023-04-20 DIAGNOSIS — L89152 Pressure ulcer of sacral region, stage 2: Secondary | ICD-10-CM | POA: Diagnosis not present

## 2023-04-20 DIAGNOSIS — F332 Major depressive disorder, recurrent severe without psychotic features: Secondary | ICD-10-CM | POA: Diagnosis not present

## 2023-04-23 ENCOUNTER — Telehealth: Payer: Self-pay | Admitting: Internal Medicine

## 2023-04-23 DIAGNOSIS — L89152 Pressure ulcer of sacral region, stage 2: Secondary | ICD-10-CM | POA: Diagnosis not present

## 2023-04-23 DIAGNOSIS — G8221 Paraplegia, complete: Secondary | ICD-10-CM | POA: Diagnosis not present

## 2023-04-23 DIAGNOSIS — I10 Essential (primary) hypertension: Secondary | ICD-10-CM | POA: Diagnosis not present

## 2023-04-23 DIAGNOSIS — F332 Major depressive disorder, recurrent severe without psychotic features: Secondary | ICD-10-CM | POA: Diagnosis not present

## 2023-04-23 DIAGNOSIS — F411 Generalized anxiety disorder: Secondary | ICD-10-CM | POA: Diagnosis not present

## 2023-04-23 DIAGNOSIS — L89622 Pressure ulcer of left heel, stage 2: Secondary | ICD-10-CM | POA: Diagnosis not present

## 2023-04-23 NOTE — Telephone Encounter (Signed)
[name] sister in law states patient in assisted living. [name] phone number is [phone].

## 2023-04-24 DIAGNOSIS — F332 Major depressive disorder, recurrent severe without psychotic features: Secondary | ICD-10-CM | POA: Diagnosis not present

## 2023-04-24 DIAGNOSIS — I10 Essential (primary) hypertension: Secondary | ICD-10-CM | POA: Diagnosis not present

## 2023-04-24 DIAGNOSIS — L89622 Pressure ulcer of left heel, stage 2: Secondary | ICD-10-CM | POA: Diagnosis not present

## 2023-04-24 DIAGNOSIS — G8221 Paraplegia, complete: Secondary | ICD-10-CM | POA: Diagnosis not present

## 2023-04-24 DIAGNOSIS — L89152 Pressure ulcer of sacral region, stage 2: Secondary | ICD-10-CM | POA: Diagnosis not present

## 2023-04-24 DIAGNOSIS — F411 Generalized anxiety disorder: Secondary | ICD-10-CM | POA: Diagnosis not present

## 2023-04-25 NOTE — Telephone Encounter (Signed)
Spoke with patients sister. She states patient is in assisted living since March. She states patient has not been using cpap machine. She states he is doing well right now.  I advised her to call back if he was to need anything. Routing to Dr. Maple Hudson as just an [place]

## 2023-04-27 DIAGNOSIS — F332 Major depressive disorder, recurrent severe without psychotic features: Secondary | ICD-10-CM | POA: Diagnosis not present

## 2023-04-27 DIAGNOSIS — F411 Generalized anxiety disorder: Secondary | ICD-10-CM | POA: Diagnosis not present

## 2023-04-27 DIAGNOSIS — L89152 Pressure ulcer of sacral region, stage 2: Secondary | ICD-10-CM | POA: Diagnosis not present

## 2023-04-27 DIAGNOSIS — L89622 Pressure ulcer of left heel, stage 2: Secondary | ICD-10-CM | POA: Diagnosis not present

## 2023-04-27 DIAGNOSIS — I10 Essential (primary) hypertension: Secondary | ICD-10-CM | POA: Diagnosis not present

## 2023-04-27 DIAGNOSIS — G8221 Paraplegia, complete: Secondary | ICD-10-CM | POA: Diagnosis not present

## 2023-04-30 DIAGNOSIS — L89152 Pressure ulcer of sacral region, stage 2: Secondary | ICD-10-CM | POA: Diagnosis not present

## 2023-04-30 DIAGNOSIS — G8221 Paraplegia, complete: Secondary | ICD-10-CM | POA: Diagnosis not present

## 2023-04-30 DIAGNOSIS — F332 Major depressive disorder, recurrent severe without psychotic features: Secondary | ICD-10-CM | POA: Diagnosis not present

## 2023-04-30 DIAGNOSIS — I10 Essential (primary) hypertension: Secondary | ICD-10-CM | POA: Diagnosis not present

## 2023-04-30 DIAGNOSIS — F411 Generalized anxiety disorder: Secondary | ICD-10-CM | POA: Diagnosis not present

## 2023-04-30 DIAGNOSIS — L89622 Pressure ulcer of left heel, stage 2: Secondary | ICD-10-CM | POA: Diagnosis not present

## 2023-05-01 DIAGNOSIS — G8221 Paraplegia, complete: Secondary | ICD-10-CM | POA: Diagnosis not present

## 2023-05-01 DIAGNOSIS — F332 Major depressive disorder, recurrent severe without psychotic features: Secondary | ICD-10-CM | POA: Diagnosis not present

## 2023-05-01 DIAGNOSIS — F411 Generalized anxiety disorder: Secondary | ICD-10-CM | POA: Diagnosis not present

## 2023-05-01 DIAGNOSIS — L89152 Pressure ulcer of sacral region, stage 2: Secondary | ICD-10-CM | POA: Diagnosis not present

## 2023-05-01 DIAGNOSIS — L89622 Pressure ulcer of left heel, stage 2: Secondary | ICD-10-CM | POA: Diagnosis not present

## 2023-05-01 DIAGNOSIS — I10 Essential (primary) hypertension: Secondary | ICD-10-CM | POA: Diagnosis not present

## 2023-05-02 DIAGNOSIS — F329 Major depressive disorder, single episode, unspecified: Secondary | ICD-10-CM | POA: Diagnosis not present

## 2023-05-02 DIAGNOSIS — F419 Anxiety disorder, unspecified: Secondary | ICD-10-CM | POA: Diagnosis not present

## 2023-05-02 DIAGNOSIS — I10 Essential (primary) hypertension: Secondary | ICD-10-CM | POA: Diagnosis not present

## 2023-05-02 DIAGNOSIS — R4189 Other symptoms and signs involving cognitive functions and awareness: Secondary | ICD-10-CM | POA: Diagnosis not present

## 2023-05-03 DIAGNOSIS — E782 Mixed hyperlipidemia: Secondary | ICD-10-CM | POA: Diagnosis not present

## 2023-05-03 DIAGNOSIS — Z79899 Other long term (current) drug therapy: Secondary | ICD-10-CM | POA: Diagnosis not present

## 2023-05-03 DIAGNOSIS — E038 Other specified hypothyroidism: Secondary | ICD-10-CM | POA: Diagnosis not present

## 2023-05-03 DIAGNOSIS — N179 Acute kidney failure, unspecified: Secondary | ICD-10-CM | POA: Diagnosis not present

## 2023-05-03 DIAGNOSIS — E119 Type 2 diabetes mellitus without complications: Secondary | ICD-10-CM | POA: Diagnosis not present

## 2023-05-03 DIAGNOSIS — F332 Major depressive disorder, recurrent severe without psychotic features: Secondary | ICD-10-CM | POA: Diagnosis not present

## 2023-05-03 DIAGNOSIS — G8221 Paraplegia, complete: Secondary | ICD-10-CM | POA: Diagnosis not present

## 2023-05-03 DIAGNOSIS — E559 Vitamin D deficiency, unspecified: Secondary | ICD-10-CM | POA: Diagnosis not present

## 2023-05-03 DIAGNOSIS — D518 Other vitamin B12 deficiency anemias: Secondary | ICD-10-CM | POA: Diagnosis not present

## 2023-05-03 DIAGNOSIS — L89153 Pressure ulcer of sacral region, stage 3: Secondary | ICD-10-CM | POA: Diagnosis not present

## 2023-05-03 DIAGNOSIS — D62 Acute posthemorrhagic anemia: Secondary | ICD-10-CM | POA: Diagnosis not present

## 2023-05-04 DIAGNOSIS — F411 Generalized anxiety disorder: Secondary | ICD-10-CM | POA: Diagnosis not present

## 2023-05-04 DIAGNOSIS — F332 Major depressive disorder, recurrent severe without psychotic features: Secondary | ICD-10-CM | POA: Diagnosis not present

## 2023-05-04 DIAGNOSIS — L89622 Pressure ulcer of left heel, stage 2: Secondary | ICD-10-CM | POA: Diagnosis not present

## 2023-05-04 DIAGNOSIS — G8221 Paraplegia, complete: Secondary | ICD-10-CM | POA: Diagnosis not present

## 2023-05-04 DIAGNOSIS — L89152 Pressure ulcer of sacral region, stage 2: Secondary | ICD-10-CM | POA: Diagnosis not present

## 2023-05-04 DIAGNOSIS — I10 Essential (primary) hypertension: Secondary | ICD-10-CM | POA: Diagnosis not present

## 2023-05-08 DIAGNOSIS — G8221 Paraplegia, complete: Secondary | ICD-10-CM | POA: Diagnosis not present

## 2023-05-08 DIAGNOSIS — L89152 Pressure ulcer of sacral region, stage 2: Secondary | ICD-10-CM | POA: Diagnosis not present

## 2023-05-08 DIAGNOSIS — F411 Generalized anxiety disorder: Secondary | ICD-10-CM | POA: Diagnosis not present

## 2023-05-08 DIAGNOSIS — I10 Essential (primary) hypertension: Secondary | ICD-10-CM | POA: Diagnosis not present

## 2023-05-08 DIAGNOSIS — L89622 Pressure ulcer of left heel, stage 2: Secondary | ICD-10-CM | POA: Diagnosis not present

## 2023-05-08 DIAGNOSIS — F332 Major depressive disorder, recurrent severe without psychotic features: Secondary | ICD-10-CM | POA: Diagnosis not present

## 2023-05-10 DIAGNOSIS — L89622 Pressure ulcer of left heel, stage 2: Secondary | ICD-10-CM | POA: Diagnosis not present

## 2023-05-10 DIAGNOSIS — F332 Major depressive disorder, recurrent severe without psychotic features: Secondary | ICD-10-CM | POA: Diagnosis not present

## 2023-05-10 DIAGNOSIS — I10 Essential (primary) hypertension: Secondary | ICD-10-CM | POA: Diagnosis not present

## 2023-05-10 DIAGNOSIS — L89152 Pressure ulcer of sacral region, stage 2: Secondary | ICD-10-CM | POA: Diagnosis not present

## 2023-05-10 DIAGNOSIS — G8221 Paraplegia, complete: Secondary | ICD-10-CM | POA: Diagnosis not present

## 2023-05-10 DIAGNOSIS — F411 Generalized anxiety disorder: Secondary | ICD-10-CM | POA: Diagnosis not present

## 2023-05-11 DIAGNOSIS — I10 Essential (primary) hypertension: Secondary | ICD-10-CM | POA: Diagnosis not present

## 2023-05-11 DIAGNOSIS — F332 Major depressive disorder, recurrent severe without psychotic features: Secondary | ICD-10-CM | POA: Diagnosis not present

## 2023-05-11 DIAGNOSIS — L89622 Pressure ulcer of left heel, stage 2: Secondary | ICD-10-CM | POA: Diagnosis not present

## 2023-05-11 DIAGNOSIS — F411 Generalized anxiety disorder: Secondary | ICD-10-CM | POA: Diagnosis not present

## 2023-05-11 DIAGNOSIS — G8221 Paraplegia, complete: Secondary | ICD-10-CM | POA: Diagnosis not present

## 2023-05-11 DIAGNOSIS — L89152 Pressure ulcer of sacral region, stage 2: Secondary | ICD-10-CM | POA: Diagnosis not present

## 2023-05-12 ENCOUNTER — Other Ambulatory Visit: Payer: Self-pay | Admitting: Physical Medicine & Rehabilitation

## 2023-05-14 DIAGNOSIS — L89152 Pressure ulcer of sacral region, stage 2: Secondary | ICD-10-CM | POA: Diagnosis not present

## 2023-05-14 DIAGNOSIS — F411 Generalized anxiety disorder: Secondary | ICD-10-CM | POA: Diagnosis not present

## 2023-05-14 DIAGNOSIS — L89622 Pressure ulcer of left heel, stage 2: Secondary | ICD-10-CM | POA: Diagnosis not present

## 2023-05-14 DIAGNOSIS — I10 Essential (primary) hypertension: Secondary | ICD-10-CM | POA: Diagnosis not present

## 2023-05-14 DIAGNOSIS — G8221 Paraplegia, complete: Secondary | ICD-10-CM | POA: Diagnosis not present

## 2023-05-14 DIAGNOSIS — F332 Major depressive disorder, recurrent severe without psychotic features: Secondary | ICD-10-CM | POA: Diagnosis not present

## 2023-05-15 DIAGNOSIS — Z556 Problems related to health literacy: Secondary | ICD-10-CM | POA: Diagnosis not present

## 2023-05-15 DIAGNOSIS — F411 Generalized anxiety disorder: Secondary | ICD-10-CM | POA: Diagnosis not present

## 2023-05-15 DIAGNOSIS — N319 Neuromuscular dysfunction of bladder, unspecified: Secondary | ICD-10-CM | POA: Diagnosis not present

## 2023-05-15 DIAGNOSIS — K589 Irritable bowel syndrome without diarrhea: Secondary | ICD-10-CM | POA: Diagnosis not present

## 2023-05-15 DIAGNOSIS — M109 Gout, unspecified: Secondary | ICD-10-CM | POA: Diagnosis not present

## 2023-05-15 DIAGNOSIS — H5319 Other subjective visual disturbances: Secondary | ICD-10-CM | POA: Diagnosis not present

## 2023-05-15 DIAGNOSIS — Z8546 Personal history of malignant neoplasm of prostate: Secondary | ICD-10-CM | POA: Diagnosis not present

## 2023-05-15 DIAGNOSIS — M545 Low back pain, unspecified: Secondary | ICD-10-CM | POA: Diagnosis not present

## 2023-05-15 DIAGNOSIS — Z7982 Long term (current) use of aspirin: Secondary | ICD-10-CM | POA: Diagnosis not present

## 2023-05-15 DIAGNOSIS — Z9181 History of falling: Secondary | ICD-10-CM | POA: Diagnosis not present

## 2023-05-15 DIAGNOSIS — Z993 Dependence on wheelchair: Secondary | ICD-10-CM | POA: Diagnosis not present

## 2023-05-15 DIAGNOSIS — I10 Essential (primary) hypertension: Secondary | ICD-10-CM | POA: Diagnosis not present

## 2023-05-15 DIAGNOSIS — F332 Major depressive disorder, recurrent severe without psychotic features: Secondary | ICD-10-CM | POA: Diagnosis not present

## 2023-05-15 DIAGNOSIS — J309 Allergic rhinitis, unspecified: Secondary | ICD-10-CM | POA: Diagnosis not present

## 2023-05-15 DIAGNOSIS — E559 Vitamin D deficiency, unspecified: Secondary | ICD-10-CM | POA: Diagnosis not present

## 2023-05-15 DIAGNOSIS — F4323 Adjustment disorder with mixed anxiety and depressed mood: Secondary | ICD-10-CM | POA: Diagnosis not present

## 2023-05-15 DIAGNOSIS — K626 Ulcer of anus and rectum: Secondary | ICD-10-CM | POA: Diagnosis not present

## 2023-05-15 DIAGNOSIS — Z87440 Personal history of urinary (tract) infections: Secondary | ICD-10-CM | POA: Diagnosis not present

## 2023-05-15 DIAGNOSIS — G4733 Obstructive sleep apnea (adult) (pediatric): Secondary | ICD-10-CM | POA: Diagnosis not present

## 2023-05-15 DIAGNOSIS — K279 Peptic ulcer, site unspecified, unspecified as acute or chronic, without hemorrhage or perforation: Secondary | ICD-10-CM | POA: Diagnosis not present

## 2023-05-15 DIAGNOSIS — G5601 Carpal tunnel syndrome, right upper limb: Secondary | ICD-10-CM | POA: Diagnosis not present

## 2023-05-15 DIAGNOSIS — G8221 Paraplegia, complete: Secondary | ICD-10-CM | POA: Diagnosis not present

## 2023-05-15 DIAGNOSIS — K219 Gastro-esophageal reflux disease without esophagitis: Secondary | ICD-10-CM | POA: Diagnosis not present

## 2023-05-15 DIAGNOSIS — S51812D Laceration without foreign body of left forearm, subsequent encounter: Secondary | ICD-10-CM | POA: Diagnosis not present

## 2023-05-15 DIAGNOSIS — K5904 Chronic idiopathic constipation: Secondary | ICD-10-CM | POA: Diagnosis not present

## 2023-05-16 DIAGNOSIS — G8221 Paraplegia, complete: Secondary | ICD-10-CM | POA: Diagnosis not present

## 2023-05-16 DIAGNOSIS — E559 Vitamin D deficiency, unspecified: Secondary | ICD-10-CM | POA: Diagnosis not present

## 2023-05-16 DIAGNOSIS — F332 Major depressive disorder, recurrent severe without psychotic features: Secondary | ICD-10-CM | POA: Diagnosis not present

## 2023-05-16 DIAGNOSIS — E119 Type 2 diabetes mellitus without complications: Secondary | ICD-10-CM | POA: Diagnosis not present

## 2023-05-16 DIAGNOSIS — D62 Acute posthemorrhagic anemia: Secondary | ICD-10-CM | POA: Diagnosis not present

## 2023-05-16 DIAGNOSIS — E782 Mixed hyperlipidemia: Secondary | ICD-10-CM | POA: Diagnosis not present

## 2023-05-16 DIAGNOSIS — L89153 Pressure ulcer of sacral region, stage 3: Secondary | ICD-10-CM | POA: Diagnosis not present

## 2023-05-16 DIAGNOSIS — D518 Other vitamin B12 deficiency anemias: Secondary | ICD-10-CM | POA: Diagnosis not present

## 2023-05-16 DIAGNOSIS — N179 Acute kidney failure, unspecified: Secondary | ICD-10-CM | POA: Diagnosis not present

## 2023-05-16 DIAGNOSIS — E038 Other specified hypothyroidism: Secondary | ICD-10-CM | POA: Diagnosis not present

## 2023-05-18 DIAGNOSIS — G8221 Paraplegia, complete: Secondary | ICD-10-CM | POA: Diagnosis not present

## 2023-05-18 DIAGNOSIS — F4323 Adjustment disorder with mixed anxiety and depressed mood: Secondary | ICD-10-CM | POA: Diagnosis not present

## 2023-05-18 DIAGNOSIS — F332 Major depressive disorder, recurrent severe without psychotic features: Secondary | ICD-10-CM | POA: Diagnosis not present

## 2023-05-18 DIAGNOSIS — I10 Essential (primary) hypertension: Secondary | ICD-10-CM | POA: Diagnosis not present

## 2023-05-18 DIAGNOSIS — S51812D Laceration without foreign body of left forearm, subsequent encounter: Secondary | ICD-10-CM | POA: Diagnosis not present

## 2023-05-18 DIAGNOSIS — F411 Generalized anxiety disorder: Secondary | ICD-10-CM | POA: Diagnosis not present

## 2023-05-21 DIAGNOSIS — I10 Essential (primary) hypertension: Secondary | ICD-10-CM | POA: Diagnosis not present

## 2023-05-21 DIAGNOSIS — F332 Major depressive disorder, recurrent severe without psychotic features: Secondary | ICD-10-CM | POA: Diagnosis not present

## 2023-05-21 DIAGNOSIS — S51812D Laceration without foreign body of left forearm, subsequent encounter: Secondary | ICD-10-CM | POA: Diagnosis not present

## 2023-05-21 DIAGNOSIS — F411 Generalized anxiety disorder: Secondary | ICD-10-CM | POA: Diagnosis not present

## 2023-05-21 DIAGNOSIS — F4323 Adjustment disorder with mixed anxiety and depressed mood: Secondary | ICD-10-CM | POA: Diagnosis not present

## 2023-05-21 DIAGNOSIS — G8221 Paraplegia, complete: Secondary | ICD-10-CM | POA: Diagnosis not present

## 2023-05-23 DIAGNOSIS — F419 Anxiety disorder, unspecified: Secondary | ICD-10-CM | POA: Diagnosis not present

## 2023-05-23 DIAGNOSIS — R4189 Other symptoms and signs involving cognitive functions and awareness: Secondary | ICD-10-CM | POA: Diagnosis not present

## 2023-05-23 DIAGNOSIS — F329 Major depressive disorder, single episode, unspecified: Secondary | ICD-10-CM | POA: Diagnosis not present

## 2023-06-01 DIAGNOSIS — F332 Major depressive disorder, recurrent severe without psychotic features: Secondary | ICD-10-CM | POA: Diagnosis not present

## 2023-06-01 DIAGNOSIS — G8221 Paraplegia, complete: Secondary | ICD-10-CM | POA: Diagnosis not present

## 2023-06-01 DIAGNOSIS — I10 Essential (primary) hypertension: Secondary | ICD-10-CM | POA: Diagnosis not present

## 2023-06-01 DIAGNOSIS — F4323 Adjustment disorder with mixed anxiety and depressed mood: Secondary | ICD-10-CM | POA: Diagnosis not present

## 2023-06-01 DIAGNOSIS — S51812D Laceration without foreign body of left forearm, subsequent encounter: Secondary | ICD-10-CM | POA: Diagnosis not present

## 2023-06-01 DIAGNOSIS — F411 Generalized anxiety disorder: Secondary | ICD-10-CM | POA: Diagnosis not present

## 2023-06-06 DIAGNOSIS — F411 Generalized anxiety disorder: Secondary | ICD-10-CM | POA: Diagnosis not present

## 2023-06-06 DIAGNOSIS — F332 Major depressive disorder, recurrent severe without psychotic features: Secondary | ICD-10-CM | POA: Diagnosis not present

## 2023-06-06 DIAGNOSIS — S51812D Laceration without foreign body of left forearm, subsequent encounter: Secondary | ICD-10-CM | POA: Diagnosis not present

## 2023-06-06 DIAGNOSIS — I10 Essential (primary) hypertension: Secondary | ICD-10-CM | POA: Diagnosis not present

## 2023-06-06 DIAGNOSIS — G8221 Paraplegia, complete: Secondary | ICD-10-CM | POA: Diagnosis not present

## 2023-06-06 DIAGNOSIS — F4323 Adjustment disorder with mixed anxiety and depressed mood: Secondary | ICD-10-CM | POA: Diagnosis not present

## 2023-06-08 DIAGNOSIS — F332 Major depressive disorder, recurrent severe without psychotic features: Secondary | ICD-10-CM | POA: Diagnosis not present

## 2023-06-08 DIAGNOSIS — F411 Generalized anxiety disorder: Secondary | ICD-10-CM | POA: Diagnosis not present

## 2023-06-08 DIAGNOSIS — I10 Essential (primary) hypertension: Secondary | ICD-10-CM | POA: Diagnosis not present

## 2023-06-08 DIAGNOSIS — F4323 Adjustment disorder with mixed anxiety and depressed mood: Secondary | ICD-10-CM | POA: Diagnosis not present

## 2023-06-08 DIAGNOSIS — G8221 Paraplegia, complete: Secondary | ICD-10-CM | POA: Diagnosis not present

## 2023-06-08 DIAGNOSIS — S51812D Laceration without foreign body of left forearm, subsequent encounter: Secondary | ICD-10-CM | POA: Diagnosis not present

## 2023-06-11 DIAGNOSIS — F332 Major depressive disorder, recurrent severe without psychotic features: Secondary | ICD-10-CM | POA: Diagnosis not present

## 2023-06-11 DIAGNOSIS — F4323 Adjustment disorder with mixed anxiety and depressed mood: Secondary | ICD-10-CM | POA: Diagnosis not present

## 2023-06-11 DIAGNOSIS — F411 Generalized anxiety disorder: Secondary | ICD-10-CM | POA: Diagnosis not present

## 2023-06-11 DIAGNOSIS — I10 Essential (primary) hypertension: Secondary | ICD-10-CM | POA: Diagnosis not present

## 2023-06-11 DIAGNOSIS — S51812D Laceration without foreign body of left forearm, subsequent encounter: Secondary | ICD-10-CM | POA: Diagnosis not present

## 2023-06-11 DIAGNOSIS — G8221 Paraplegia, complete: Secondary | ICD-10-CM | POA: Diagnosis not present

## 2023-06-12 DIAGNOSIS — E038 Other specified hypothyroidism: Secondary | ICD-10-CM | POA: Diagnosis not present

## 2023-06-12 DIAGNOSIS — D62 Acute posthemorrhagic anemia: Secondary | ICD-10-CM | POA: Diagnosis not present

## 2023-06-12 DIAGNOSIS — E782 Mixed hyperlipidemia: Secondary | ICD-10-CM | POA: Diagnosis not present

## 2023-06-12 DIAGNOSIS — F332 Major depressive disorder, recurrent severe without psychotic features: Secondary | ICD-10-CM | POA: Diagnosis not present

## 2023-06-12 DIAGNOSIS — L89153 Pressure ulcer of sacral region, stage 3: Secondary | ICD-10-CM | POA: Diagnosis not present

## 2023-06-12 DIAGNOSIS — E559 Vitamin D deficiency, unspecified: Secondary | ICD-10-CM | POA: Diagnosis not present

## 2023-06-12 DIAGNOSIS — N179 Acute kidney failure, unspecified: Secondary | ICD-10-CM | POA: Diagnosis not present

## 2023-06-12 DIAGNOSIS — E119 Type 2 diabetes mellitus without complications: Secondary | ICD-10-CM | POA: Diagnosis not present

## 2023-06-12 DIAGNOSIS — D518 Other vitamin B12 deficiency anemias: Secondary | ICD-10-CM | POA: Diagnosis not present

## 2023-06-12 DIAGNOSIS — G8221 Paraplegia, complete: Secondary | ICD-10-CM | POA: Diagnosis not present

## 2023-06-13 DIAGNOSIS — F411 Generalized anxiety disorder: Secondary | ICD-10-CM | POA: Diagnosis not present

## 2023-06-13 DIAGNOSIS — S51812D Laceration without foreign body of left forearm, subsequent encounter: Secondary | ICD-10-CM | POA: Diagnosis not present

## 2023-06-13 DIAGNOSIS — F332 Major depressive disorder, recurrent severe without psychotic features: Secondary | ICD-10-CM | POA: Diagnosis not present

## 2023-06-13 DIAGNOSIS — G8221 Paraplegia, complete: Secondary | ICD-10-CM | POA: Diagnosis not present

## 2023-06-13 DIAGNOSIS — F4323 Adjustment disorder with mixed anxiety and depressed mood: Secondary | ICD-10-CM | POA: Diagnosis not present

## 2023-06-13 DIAGNOSIS — I10 Essential (primary) hypertension: Secondary | ICD-10-CM | POA: Diagnosis not present

## 2023-06-14 DIAGNOSIS — K626 Ulcer of anus and rectum: Secondary | ICD-10-CM | POA: Diagnosis not present

## 2023-06-14 DIAGNOSIS — M545 Low back pain, unspecified: Secondary | ICD-10-CM | POA: Diagnosis not present

## 2023-06-14 DIAGNOSIS — I10 Essential (primary) hypertension: Secondary | ICD-10-CM | POA: Diagnosis not present

## 2023-06-14 DIAGNOSIS — Z993 Dependence on wheelchair: Secondary | ICD-10-CM | POA: Diagnosis not present

## 2023-06-14 DIAGNOSIS — G8221 Paraplegia, complete: Secondary | ICD-10-CM | POA: Diagnosis not present

## 2023-06-14 DIAGNOSIS — K279 Peptic ulcer, site unspecified, unspecified as acute or chronic, without hemorrhage or perforation: Secondary | ICD-10-CM | POA: Diagnosis not present

## 2023-06-14 DIAGNOSIS — F4323 Adjustment disorder with mixed anxiety and depressed mood: Secondary | ICD-10-CM | POA: Diagnosis not present

## 2023-06-14 DIAGNOSIS — S51812D Laceration without foreign body of left forearm, subsequent encounter: Secondary | ICD-10-CM | POA: Diagnosis not present

## 2023-06-14 DIAGNOSIS — G5601 Carpal tunnel syndrome, right upper limb: Secondary | ICD-10-CM | POA: Diagnosis not present

## 2023-06-14 DIAGNOSIS — K589 Irritable bowel syndrome without diarrhea: Secondary | ICD-10-CM | POA: Diagnosis not present

## 2023-06-14 DIAGNOSIS — F332 Major depressive disorder, recurrent severe without psychotic features: Secondary | ICD-10-CM | POA: Diagnosis not present

## 2023-06-14 DIAGNOSIS — Z9181 History of falling: Secondary | ICD-10-CM | POA: Diagnosis not present

## 2023-06-14 DIAGNOSIS — H5319 Other subjective visual disturbances: Secondary | ICD-10-CM | POA: Diagnosis not present

## 2023-06-14 DIAGNOSIS — J309 Allergic rhinitis, unspecified: Secondary | ICD-10-CM | POA: Diagnosis not present

## 2023-06-14 DIAGNOSIS — K5904 Chronic idiopathic constipation: Secondary | ICD-10-CM | POA: Diagnosis not present

## 2023-06-14 DIAGNOSIS — N319 Neuromuscular dysfunction of bladder, unspecified: Secondary | ICD-10-CM | POA: Diagnosis not present

## 2023-06-14 DIAGNOSIS — G4733 Obstructive sleep apnea (adult) (pediatric): Secondary | ICD-10-CM | POA: Diagnosis not present

## 2023-06-14 DIAGNOSIS — K219 Gastro-esophageal reflux disease without esophagitis: Secondary | ICD-10-CM | POA: Diagnosis not present

## 2023-06-14 DIAGNOSIS — M109 Gout, unspecified: Secondary | ICD-10-CM | POA: Diagnosis not present

## 2023-06-14 DIAGNOSIS — F411 Generalized anxiety disorder: Secondary | ICD-10-CM | POA: Diagnosis not present

## 2023-06-14 DIAGNOSIS — Z8546 Personal history of malignant neoplasm of prostate: Secondary | ICD-10-CM | POA: Diagnosis not present

## 2023-06-14 DIAGNOSIS — Z87440 Personal history of urinary (tract) infections: Secondary | ICD-10-CM | POA: Diagnosis not present

## 2023-06-14 DIAGNOSIS — Z556 Problems related to health literacy: Secondary | ICD-10-CM | POA: Diagnosis not present

## 2023-06-14 DIAGNOSIS — Z7982 Long term (current) use of aspirin: Secondary | ICD-10-CM | POA: Diagnosis not present

## 2023-06-14 DIAGNOSIS — E559 Vitamin D deficiency, unspecified: Secondary | ICD-10-CM | POA: Diagnosis not present

## 2023-06-15 DIAGNOSIS — F332 Major depressive disorder, recurrent severe without psychotic features: Secondary | ICD-10-CM | POA: Diagnosis not present

## 2023-06-15 DIAGNOSIS — I10 Essential (primary) hypertension: Secondary | ICD-10-CM | POA: Diagnosis not present

## 2023-06-15 DIAGNOSIS — F4323 Adjustment disorder with mixed anxiety and depressed mood: Secondary | ICD-10-CM | POA: Diagnosis not present

## 2023-06-15 DIAGNOSIS — G8221 Paraplegia, complete: Secondary | ICD-10-CM | POA: Diagnosis not present

## 2023-06-15 DIAGNOSIS — F411 Generalized anxiety disorder: Secondary | ICD-10-CM | POA: Diagnosis not present

## 2023-06-15 DIAGNOSIS — S51812D Laceration without foreign body of left forearm, subsequent encounter: Secondary | ICD-10-CM | POA: Diagnosis not present

## 2023-06-18 DIAGNOSIS — F332 Major depressive disorder, recurrent severe without psychotic features: Secondary | ICD-10-CM | POA: Diagnosis not present

## 2023-06-18 DIAGNOSIS — F4323 Adjustment disorder with mixed anxiety and depressed mood: Secondary | ICD-10-CM | POA: Diagnosis not present

## 2023-06-18 DIAGNOSIS — I10 Essential (primary) hypertension: Secondary | ICD-10-CM | POA: Diagnosis not present

## 2023-06-18 DIAGNOSIS — G8221 Paraplegia, complete: Secondary | ICD-10-CM | POA: Diagnosis not present

## 2023-06-18 DIAGNOSIS — S51812D Laceration without foreign body of left forearm, subsequent encounter: Secondary | ICD-10-CM | POA: Diagnosis not present

## 2023-06-18 DIAGNOSIS — F411 Generalized anxiety disorder: Secondary | ICD-10-CM | POA: Diagnosis not present

## 2023-06-20 DIAGNOSIS — F332 Major depressive disorder, recurrent severe without psychotic features: Secondary | ICD-10-CM | POA: Diagnosis not present

## 2023-06-20 DIAGNOSIS — I10 Essential (primary) hypertension: Secondary | ICD-10-CM | POA: Diagnosis not present

## 2023-06-20 DIAGNOSIS — G8221 Paraplegia, complete: Secondary | ICD-10-CM | POA: Diagnosis not present

## 2023-06-20 DIAGNOSIS — S51812D Laceration without foreign body of left forearm, subsequent encounter: Secondary | ICD-10-CM | POA: Diagnosis not present

## 2023-06-20 DIAGNOSIS — F411 Generalized anxiety disorder: Secondary | ICD-10-CM | POA: Diagnosis not present

## 2023-06-20 DIAGNOSIS — F4323 Adjustment disorder with mixed anxiety and depressed mood: Secondary | ICD-10-CM | POA: Diagnosis not present

## 2023-06-22 DIAGNOSIS — F411 Generalized anxiety disorder: Secondary | ICD-10-CM | POA: Diagnosis not present

## 2023-06-22 DIAGNOSIS — G8221 Paraplegia, complete: Secondary | ICD-10-CM | POA: Diagnosis not present

## 2023-06-22 DIAGNOSIS — S51812D Laceration without foreign body of left forearm, subsequent encounter: Secondary | ICD-10-CM | POA: Diagnosis not present

## 2023-06-22 DIAGNOSIS — F332 Major depressive disorder, recurrent severe without psychotic features: Secondary | ICD-10-CM | POA: Diagnosis not present

## 2023-06-22 DIAGNOSIS — F4323 Adjustment disorder with mixed anxiety and depressed mood: Secondary | ICD-10-CM | POA: Diagnosis not present

## 2023-06-22 DIAGNOSIS — I10 Essential (primary) hypertension: Secondary | ICD-10-CM | POA: Diagnosis not present

## 2023-06-27 DIAGNOSIS — R4189 Other symptoms and signs involving cognitive functions and awareness: Secondary | ICD-10-CM | POA: Diagnosis not present

## 2023-06-27 DIAGNOSIS — G8221 Paraplegia, complete: Secondary | ICD-10-CM | POA: Diagnosis not present

## 2023-06-27 DIAGNOSIS — F411 Generalized anxiety disorder: Secondary | ICD-10-CM | POA: Diagnosis not present

## 2023-06-27 DIAGNOSIS — F419 Anxiety disorder, unspecified: Secondary | ICD-10-CM | POA: Diagnosis not present

## 2023-06-27 DIAGNOSIS — F332 Major depressive disorder, recurrent severe without psychotic features: Secondary | ICD-10-CM | POA: Diagnosis not present

## 2023-06-27 DIAGNOSIS — S51812D Laceration without foreign body of left forearm, subsequent encounter: Secondary | ICD-10-CM | POA: Diagnosis not present

## 2023-06-27 DIAGNOSIS — F329 Major depressive disorder, single episode, unspecified: Secondary | ICD-10-CM | POA: Diagnosis not present

## 2023-06-27 DIAGNOSIS — I10 Essential (primary) hypertension: Secondary | ICD-10-CM | POA: Diagnosis not present

## 2023-06-27 DIAGNOSIS — F4323 Adjustment disorder with mixed anxiety and depressed mood: Secondary | ICD-10-CM | POA: Diagnosis not present

## 2023-06-29 DIAGNOSIS — F332 Major depressive disorder, recurrent severe without psychotic features: Secondary | ICD-10-CM | POA: Diagnosis not present

## 2023-06-29 DIAGNOSIS — I10 Essential (primary) hypertension: Secondary | ICD-10-CM | POA: Diagnosis not present

## 2023-06-29 DIAGNOSIS — F411 Generalized anxiety disorder: Secondary | ICD-10-CM | POA: Diagnosis not present

## 2023-06-29 DIAGNOSIS — S51812D Laceration without foreign body of left forearm, subsequent encounter: Secondary | ICD-10-CM | POA: Diagnosis not present

## 2023-06-29 DIAGNOSIS — F4323 Adjustment disorder with mixed anxiety and depressed mood: Secondary | ICD-10-CM | POA: Diagnosis not present

## 2023-06-29 DIAGNOSIS — G8221 Paraplegia, complete: Secondary | ICD-10-CM | POA: Diagnosis not present

## 2023-07-02 DIAGNOSIS — F332 Major depressive disorder, recurrent severe without psychotic features: Secondary | ICD-10-CM | POA: Diagnosis not present

## 2023-07-02 DIAGNOSIS — S51812D Laceration without foreign body of left forearm, subsequent encounter: Secondary | ICD-10-CM | POA: Diagnosis not present

## 2023-07-02 DIAGNOSIS — F411 Generalized anxiety disorder: Secondary | ICD-10-CM | POA: Diagnosis not present

## 2023-07-02 DIAGNOSIS — I10 Essential (primary) hypertension: Secondary | ICD-10-CM | POA: Diagnosis not present

## 2023-07-02 DIAGNOSIS — F4323 Adjustment disorder with mixed anxiety and depressed mood: Secondary | ICD-10-CM | POA: Diagnosis not present

## 2023-07-02 DIAGNOSIS — G8221 Paraplegia, complete: Secondary | ICD-10-CM | POA: Diagnosis not present

## 2023-07-06 DIAGNOSIS — G8221 Paraplegia, complete: Secondary | ICD-10-CM | POA: Diagnosis not present

## 2023-07-06 DIAGNOSIS — I10 Essential (primary) hypertension: Secondary | ICD-10-CM | POA: Diagnosis not present

## 2023-07-06 DIAGNOSIS — F332 Major depressive disorder, recurrent severe without psychotic features: Secondary | ICD-10-CM | POA: Diagnosis not present

## 2023-07-06 DIAGNOSIS — F411 Generalized anxiety disorder: Secondary | ICD-10-CM | POA: Diagnosis not present

## 2023-07-06 DIAGNOSIS — S51812D Laceration without foreign body of left forearm, subsequent encounter: Secondary | ICD-10-CM | POA: Diagnosis not present

## 2023-07-06 DIAGNOSIS — F4323 Adjustment disorder with mixed anxiety and depressed mood: Secondary | ICD-10-CM | POA: Diagnosis not present

## 2023-07-09 DIAGNOSIS — F329 Major depressive disorder, single episode, unspecified: Secondary | ICD-10-CM | POA: Diagnosis not present

## 2023-07-09 DIAGNOSIS — R4189 Other symptoms and signs involving cognitive functions and awareness: Secondary | ICD-10-CM | POA: Diagnosis not present

## 2023-07-09 DIAGNOSIS — F419 Anxiety disorder, unspecified: Secondary | ICD-10-CM | POA: Diagnosis not present

## 2023-07-11 DIAGNOSIS — F411 Generalized anxiety disorder: Secondary | ICD-10-CM | POA: Diagnosis not present

## 2023-07-11 DIAGNOSIS — S51812D Laceration without foreign body of left forearm, subsequent encounter: Secondary | ICD-10-CM | POA: Diagnosis not present

## 2023-07-11 DIAGNOSIS — G8221 Paraplegia, complete: Secondary | ICD-10-CM | POA: Diagnosis not present

## 2023-07-11 DIAGNOSIS — F332 Major depressive disorder, recurrent severe without psychotic features: Secondary | ICD-10-CM | POA: Diagnosis not present

## 2023-07-11 DIAGNOSIS — F4323 Adjustment disorder with mixed anxiety and depressed mood: Secondary | ICD-10-CM | POA: Diagnosis not present

## 2023-07-11 DIAGNOSIS — I10 Essential (primary) hypertension: Secondary | ICD-10-CM | POA: Diagnosis not present

## 2023-07-13 DIAGNOSIS — F411 Generalized anxiety disorder: Secondary | ICD-10-CM | POA: Diagnosis not present

## 2023-07-13 DIAGNOSIS — G8221 Paraplegia, complete: Secondary | ICD-10-CM | POA: Diagnosis not present

## 2023-07-13 DIAGNOSIS — S51812D Laceration without foreign body of left forearm, subsequent encounter: Secondary | ICD-10-CM | POA: Diagnosis not present

## 2023-07-13 DIAGNOSIS — I10 Essential (primary) hypertension: Secondary | ICD-10-CM | POA: Diagnosis not present

## 2023-07-13 DIAGNOSIS — F332 Major depressive disorder, recurrent severe without psychotic features: Secondary | ICD-10-CM | POA: Diagnosis not present

## 2023-07-13 DIAGNOSIS — F4323 Adjustment disorder with mixed anxiety and depressed mood: Secondary | ICD-10-CM | POA: Diagnosis not present

## 2023-07-14 DIAGNOSIS — L89622 Pressure ulcer of left heel, stage 2: Secondary | ICD-10-CM | POA: Diagnosis not present

## 2023-07-14 DIAGNOSIS — N319 Neuromuscular dysfunction of bladder, unspecified: Secondary | ICD-10-CM | POA: Diagnosis not present

## 2023-07-14 DIAGNOSIS — E559 Vitamin D deficiency, unspecified: Secondary | ICD-10-CM | POA: Diagnosis not present

## 2023-07-14 DIAGNOSIS — G4733 Obstructive sleep apnea (adult) (pediatric): Secondary | ICD-10-CM | POA: Diagnosis not present

## 2023-07-14 DIAGNOSIS — F332 Major depressive disorder, recurrent severe without psychotic features: Secondary | ICD-10-CM | POA: Diagnosis not present

## 2023-07-14 DIAGNOSIS — Z993 Dependence on wheelchair: Secondary | ICD-10-CM | POA: Diagnosis not present

## 2023-07-14 DIAGNOSIS — K589 Irritable bowel syndrome without diarrhea: Secondary | ICD-10-CM | POA: Diagnosis not present

## 2023-07-14 DIAGNOSIS — G5601 Carpal tunnel syndrome, right upper limb: Secondary | ICD-10-CM | POA: Diagnosis not present

## 2023-07-14 DIAGNOSIS — K219 Gastro-esophageal reflux disease without esophagitis: Secondary | ICD-10-CM | POA: Diagnosis not present

## 2023-07-14 DIAGNOSIS — F411 Generalized anxiety disorder: Secondary | ICD-10-CM | POA: Diagnosis not present

## 2023-07-14 DIAGNOSIS — Z87440 Personal history of urinary (tract) infections: Secondary | ICD-10-CM | POA: Diagnosis not present

## 2023-07-14 DIAGNOSIS — K5904 Chronic idiopathic constipation: Secondary | ICD-10-CM | POA: Diagnosis not present

## 2023-07-14 DIAGNOSIS — F4323 Adjustment disorder with mixed anxiety and depressed mood: Secondary | ICD-10-CM | POA: Diagnosis not present

## 2023-07-14 DIAGNOSIS — M545 Low back pain, unspecified: Secondary | ICD-10-CM | POA: Diagnosis not present

## 2023-07-14 DIAGNOSIS — Z7982 Long term (current) use of aspirin: Secondary | ICD-10-CM | POA: Diagnosis not present

## 2023-07-14 DIAGNOSIS — J309 Allergic rhinitis, unspecified: Secondary | ICD-10-CM | POA: Diagnosis not present

## 2023-07-14 DIAGNOSIS — M109 Gout, unspecified: Secondary | ICD-10-CM | POA: Diagnosis not present

## 2023-07-14 DIAGNOSIS — Z9181 History of falling: Secondary | ICD-10-CM | POA: Diagnosis not present

## 2023-07-14 DIAGNOSIS — Z8546 Personal history of malignant neoplasm of prostate: Secondary | ICD-10-CM | POA: Diagnosis not present

## 2023-07-14 DIAGNOSIS — G8221 Paraplegia, complete: Secondary | ICD-10-CM | POA: Diagnosis not present

## 2023-07-14 DIAGNOSIS — H5319 Other subjective visual disturbances: Secondary | ICD-10-CM | POA: Diagnosis not present

## 2023-07-14 DIAGNOSIS — Z556 Problems related to health literacy: Secondary | ICD-10-CM | POA: Diagnosis not present

## 2023-07-14 DIAGNOSIS — I10 Essential (primary) hypertension: Secondary | ICD-10-CM | POA: Diagnosis not present

## 2023-07-18 DIAGNOSIS — F4323 Adjustment disorder with mixed anxiety and depressed mood: Secondary | ICD-10-CM | POA: Diagnosis not present

## 2023-07-18 DIAGNOSIS — F411 Generalized anxiety disorder: Secondary | ICD-10-CM | POA: Diagnosis not present

## 2023-07-18 DIAGNOSIS — G8221 Paraplegia, complete: Secondary | ICD-10-CM | POA: Diagnosis not present

## 2023-07-18 DIAGNOSIS — I10 Essential (primary) hypertension: Secondary | ICD-10-CM | POA: Diagnosis not present

## 2023-07-18 DIAGNOSIS — F332 Major depressive disorder, recurrent severe without psychotic features: Secondary | ICD-10-CM | POA: Diagnosis not present

## 2023-07-18 DIAGNOSIS — L89622 Pressure ulcer of left heel, stage 2: Secondary | ICD-10-CM | POA: Diagnosis not present

## 2023-07-20 DIAGNOSIS — F411 Generalized anxiety disorder: Secondary | ICD-10-CM | POA: Diagnosis not present

## 2023-07-20 DIAGNOSIS — F332 Major depressive disorder, recurrent severe without psychotic features: Secondary | ICD-10-CM | POA: Diagnosis not present

## 2023-07-20 DIAGNOSIS — F4323 Adjustment disorder with mixed anxiety and depressed mood: Secondary | ICD-10-CM | POA: Diagnosis not present

## 2023-07-20 DIAGNOSIS — G8221 Paraplegia, complete: Secondary | ICD-10-CM | POA: Diagnosis not present

## 2023-07-20 DIAGNOSIS — L89622 Pressure ulcer of left heel, stage 2: Secondary | ICD-10-CM | POA: Diagnosis not present

## 2023-07-20 DIAGNOSIS — I10 Essential (primary) hypertension: Secondary | ICD-10-CM | POA: Diagnosis not present

## 2023-07-22 DIAGNOSIS — N179 Acute kidney failure, unspecified: Secondary | ICD-10-CM | POA: Diagnosis not present

## 2023-07-22 DIAGNOSIS — G8221 Paraplegia, complete: Secondary | ICD-10-CM | POA: Diagnosis not present

## 2023-07-22 DIAGNOSIS — D518 Other vitamin B12 deficiency anemias: Secondary | ICD-10-CM | POA: Diagnosis not present

## 2023-07-22 DIAGNOSIS — E038 Other specified hypothyroidism: Secondary | ICD-10-CM | POA: Diagnosis not present

## 2023-07-22 DIAGNOSIS — D62 Acute posthemorrhagic anemia: Secondary | ICD-10-CM | POA: Diagnosis not present

## 2023-07-22 DIAGNOSIS — E782 Mixed hyperlipidemia: Secondary | ICD-10-CM | POA: Diagnosis not present

## 2023-07-22 DIAGNOSIS — E119 Type 2 diabetes mellitus without complications: Secondary | ICD-10-CM | POA: Diagnosis not present

## 2023-07-22 DIAGNOSIS — E559 Vitamin D deficiency, unspecified: Secondary | ICD-10-CM | POA: Diagnosis not present

## 2023-07-22 DIAGNOSIS — L89153 Pressure ulcer of sacral region, stage 3: Secondary | ICD-10-CM | POA: Diagnosis not present

## 2023-07-22 DIAGNOSIS — F332 Major depressive disorder, recurrent severe without psychotic features: Secondary | ICD-10-CM | POA: Diagnosis not present

## 2023-07-25 DIAGNOSIS — F332 Major depressive disorder, recurrent severe without psychotic features: Secondary | ICD-10-CM | POA: Diagnosis not present

## 2023-07-25 DIAGNOSIS — I10 Essential (primary) hypertension: Secondary | ICD-10-CM | POA: Diagnosis not present

## 2023-07-25 DIAGNOSIS — F411 Generalized anxiety disorder: Secondary | ICD-10-CM | POA: Diagnosis not present

## 2023-07-25 DIAGNOSIS — F4323 Adjustment disorder with mixed anxiety and depressed mood: Secondary | ICD-10-CM | POA: Diagnosis not present

## 2023-07-25 DIAGNOSIS — L89622 Pressure ulcer of left heel, stage 2: Secondary | ICD-10-CM | POA: Diagnosis not present

## 2023-07-25 DIAGNOSIS — G8221 Paraplegia, complete: Secondary | ICD-10-CM | POA: Diagnosis not present

## 2023-07-26 DIAGNOSIS — R600 Localized edema: Secondary | ICD-10-CM | POA: Diagnosis not present

## 2023-07-26 DIAGNOSIS — M79671 Pain in right foot: Secondary | ICD-10-CM | POA: Diagnosis not present

## 2023-07-26 DIAGNOSIS — M79672 Pain in left foot: Secondary | ICD-10-CM | POA: Diagnosis not present

## 2023-07-26 DIAGNOSIS — I739 Peripheral vascular disease, unspecified: Secondary | ICD-10-CM | POA: Diagnosis not present

## 2023-07-26 DIAGNOSIS — B351 Tinea unguium: Secondary | ICD-10-CM | POA: Diagnosis not present

## 2023-07-26 DIAGNOSIS — G8310 Monoplegia of lower limb affecting unspecified side: Secondary | ICD-10-CM | POA: Diagnosis not present

## 2023-07-26 DIAGNOSIS — L600 Ingrowing nail: Secondary | ICD-10-CM | POA: Diagnosis not present

## 2023-07-26 DIAGNOSIS — R234 Changes in skin texture: Secondary | ICD-10-CM | POA: Diagnosis not present

## 2023-07-27 DIAGNOSIS — G8221 Paraplegia, complete: Secondary | ICD-10-CM | POA: Diagnosis not present

## 2023-07-27 DIAGNOSIS — I10 Essential (primary) hypertension: Secondary | ICD-10-CM | POA: Diagnosis not present

## 2023-07-27 DIAGNOSIS — F332 Major depressive disorder, recurrent severe without psychotic features: Secondary | ICD-10-CM | POA: Diagnosis not present

## 2023-07-27 DIAGNOSIS — F411 Generalized anxiety disorder: Secondary | ICD-10-CM | POA: Diagnosis not present

## 2023-07-27 DIAGNOSIS — F4323 Adjustment disorder with mixed anxiety and depressed mood: Secondary | ICD-10-CM | POA: Diagnosis not present

## 2023-07-27 DIAGNOSIS — L89622 Pressure ulcer of left heel, stage 2: Secondary | ICD-10-CM | POA: Diagnosis not present

## 2023-07-31 DIAGNOSIS — K581 Irritable bowel syndrome with constipation: Secondary | ICD-10-CM | POA: Diagnosis not present

## 2023-07-31 DIAGNOSIS — F32A Depression, unspecified: Secondary | ICD-10-CM | POA: Diagnosis not present

## 2023-07-31 DIAGNOSIS — L89622 Pressure ulcer of left heel, stage 2: Secondary | ICD-10-CM | POA: Diagnosis not present

## 2023-07-31 DIAGNOSIS — I10 Essential (primary) hypertension: Secondary | ICD-10-CM | POA: Diagnosis not present

## 2023-07-31 DIAGNOSIS — F4323 Adjustment disorder with mixed anxiety and depressed mood: Secondary | ICD-10-CM | POA: Diagnosis not present

## 2023-07-31 DIAGNOSIS — R29898 Other symptoms and signs involving the musculoskeletal system: Secondary | ICD-10-CM | POA: Diagnosis not present

## 2023-07-31 DIAGNOSIS — G8221 Paraplegia, complete: Secondary | ICD-10-CM | POA: Diagnosis not present

## 2023-07-31 DIAGNOSIS — F411 Generalized anxiety disorder: Secondary | ICD-10-CM | POA: Diagnosis not present

## 2023-07-31 DIAGNOSIS — F332 Major depressive disorder, recurrent severe without psychotic features: Secondary | ICD-10-CM | POA: Diagnosis not present

## 2023-07-31 DIAGNOSIS — M6281 Muscle weakness (generalized): Secondary | ICD-10-CM | POA: Diagnosis not present

## 2023-07-31 DIAGNOSIS — G8310 Monoplegia of lower limb affecting unspecified side: Secondary | ICD-10-CM | POA: Diagnosis not present

## 2023-07-31 DIAGNOSIS — N319 Neuromuscular dysfunction of bladder, unspecified: Secondary | ICD-10-CM | POA: Diagnosis not present

## 2023-08-02 DIAGNOSIS — F411 Generalized anxiety disorder: Secondary | ICD-10-CM | POA: Diagnosis not present

## 2023-08-02 DIAGNOSIS — F332 Major depressive disorder, recurrent severe without psychotic features: Secondary | ICD-10-CM | POA: Diagnosis not present

## 2023-08-02 DIAGNOSIS — Z79899 Other long term (current) drug therapy: Secondary | ICD-10-CM | POA: Diagnosis not present

## 2023-08-02 DIAGNOSIS — D518 Other vitamin B12 deficiency anemias: Secondary | ICD-10-CM | POA: Diagnosis not present

## 2023-08-02 DIAGNOSIS — E559 Vitamin D deficiency, unspecified: Secondary | ICD-10-CM | POA: Diagnosis not present

## 2023-08-02 DIAGNOSIS — I10 Essential (primary) hypertension: Secondary | ICD-10-CM | POA: Diagnosis not present

## 2023-08-02 DIAGNOSIS — G8221 Paraplegia, complete: Secondary | ICD-10-CM | POA: Diagnosis not present

## 2023-08-02 DIAGNOSIS — E119 Type 2 diabetes mellitus without complications: Secondary | ICD-10-CM | POA: Diagnosis not present

## 2023-08-02 DIAGNOSIS — F4323 Adjustment disorder with mixed anxiety and depressed mood: Secondary | ICD-10-CM | POA: Diagnosis not present

## 2023-08-02 DIAGNOSIS — L89622 Pressure ulcer of left heel, stage 2: Secondary | ICD-10-CM | POA: Diagnosis not present

## 2023-08-02 DIAGNOSIS — E038 Other specified hypothyroidism: Secondary | ICD-10-CM | POA: Diagnosis not present

## 2023-08-02 DIAGNOSIS — E782 Mixed hyperlipidemia: Secondary | ICD-10-CM | POA: Diagnosis not present

## 2023-08-03 DIAGNOSIS — I10 Essential (primary) hypertension: Secondary | ICD-10-CM | POA: Diagnosis not present

## 2023-08-09 DIAGNOSIS — D518 Other vitamin B12 deficiency anemias: Secondary | ICD-10-CM | POA: Diagnosis not present

## 2023-08-09 DIAGNOSIS — E119 Type 2 diabetes mellitus without complications: Secondary | ICD-10-CM | POA: Diagnosis not present

## 2023-08-09 DIAGNOSIS — E782 Mixed hyperlipidemia: Secondary | ICD-10-CM | POA: Diagnosis not present

## 2023-08-09 DIAGNOSIS — E559 Vitamin D deficiency, unspecified: Secondary | ICD-10-CM | POA: Diagnosis not present

## 2023-08-09 DIAGNOSIS — E038 Other specified hypothyroidism: Secondary | ICD-10-CM | POA: Diagnosis not present

## 2023-08-09 DIAGNOSIS — Z79899 Other long term (current) drug therapy: Secondary | ICD-10-CM | POA: Diagnosis not present

## 2023-08-10 DIAGNOSIS — F411 Generalized anxiety disorder: Secondary | ICD-10-CM | POA: Diagnosis not present

## 2023-08-10 DIAGNOSIS — F332 Major depressive disorder, recurrent severe without psychotic features: Secondary | ICD-10-CM | POA: Diagnosis not present

## 2023-08-10 DIAGNOSIS — F4323 Adjustment disorder with mixed anxiety and depressed mood: Secondary | ICD-10-CM | POA: Diagnosis not present

## 2023-08-10 DIAGNOSIS — I10 Essential (primary) hypertension: Secondary | ICD-10-CM | POA: Diagnosis not present

## 2023-08-10 DIAGNOSIS — L89622 Pressure ulcer of left heel, stage 2: Secondary | ICD-10-CM | POA: Diagnosis not present

## 2023-08-10 DIAGNOSIS — G8221 Paraplegia, complete: Secondary | ICD-10-CM | POA: Diagnosis not present

## 2023-08-13 DIAGNOSIS — Z556 Problems related to health literacy: Secondary | ICD-10-CM | POA: Diagnosis not present

## 2023-08-13 DIAGNOSIS — N319 Neuromuscular dysfunction of bladder, unspecified: Secondary | ICD-10-CM | POA: Diagnosis not present

## 2023-08-13 DIAGNOSIS — F332 Major depressive disorder, recurrent severe without psychotic features: Secondary | ICD-10-CM | POA: Diagnosis not present

## 2023-08-13 DIAGNOSIS — G8221 Paraplegia, complete: Secondary | ICD-10-CM | POA: Diagnosis not present

## 2023-08-13 DIAGNOSIS — E559 Vitamin D deficiency, unspecified: Secondary | ICD-10-CM | POA: Diagnosis not present

## 2023-08-13 DIAGNOSIS — M109 Gout, unspecified: Secondary | ICD-10-CM | POA: Diagnosis not present

## 2023-08-13 DIAGNOSIS — Z9181 History of falling: Secondary | ICD-10-CM | POA: Diagnosis not present

## 2023-08-13 DIAGNOSIS — M545 Low back pain, unspecified: Secondary | ICD-10-CM | POA: Diagnosis not present

## 2023-08-13 DIAGNOSIS — L89622 Pressure ulcer of left heel, stage 2: Secondary | ICD-10-CM | POA: Diagnosis not present

## 2023-08-13 DIAGNOSIS — J309 Allergic rhinitis, unspecified: Secondary | ICD-10-CM | POA: Diagnosis not present

## 2023-08-13 DIAGNOSIS — G4733 Obstructive sleep apnea (adult) (pediatric): Secondary | ICD-10-CM | POA: Diagnosis not present

## 2023-08-13 DIAGNOSIS — F4323 Adjustment disorder with mixed anxiety and depressed mood: Secondary | ICD-10-CM | POA: Diagnosis not present

## 2023-08-13 DIAGNOSIS — K219 Gastro-esophageal reflux disease without esophagitis: Secondary | ICD-10-CM | POA: Diagnosis not present

## 2023-08-13 DIAGNOSIS — Z7982 Long term (current) use of aspirin: Secondary | ICD-10-CM | POA: Diagnosis not present

## 2023-08-13 DIAGNOSIS — I10 Essential (primary) hypertension: Secondary | ICD-10-CM | POA: Diagnosis not present

## 2023-08-13 DIAGNOSIS — Z87440 Personal history of urinary (tract) infections: Secondary | ICD-10-CM | POA: Diagnosis not present

## 2023-08-13 DIAGNOSIS — K5904 Chronic idiopathic constipation: Secondary | ICD-10-CM | POA: Diagnosis not present

## 2023-08-13 DIAGNOSIS — F411 Generalized anxiety disorder: Secondary | ICD-10-CM | POA: Diagnosis not present

## 2023-08-13 DIAGNOSIS — H5319 Other subjective visual disturbances: Secondary | ICD-10-CM | POA: Diagnosis not present

## 2023-08-13 DIAGNOSIS — G5601 Carpal tunnel syndrome, right upper limb: Secondary | ICD-10-CM | POA: Diagnosis not present

## 2023-08-13 DIAGNOSIS — Z993 Dependence on wheelchair: Secondary | ICD-10-CM | POA: Diagnosis not present

## 2023-08-13 DIAGNOSIS — Z8546 Personal history of malignant neoplasm of prostate: Secondary | ICD-10-CM | POA: Diagnosis not present

## 2023-08-13 DIAGNOSIS — K589 Irritable bowel syndrome without diarrhea: Secondary | ICD-10-CM | POA: Diagnosis not present

## 2023-08-14 DIAGNOSIS — D62 Acute posthemorrhagic anemia: Secondary | ICD-10-CM | POA: Diagnosis not present

## 2023-08-14 DIAGNOSIS — R4189 Other symptoms and signs involving cognitive functions and awareness: Secondary | ICD-10-CM | POA: Diagnosis not present

## 2023-08-14 DIAGNOSIS — F332 Major depressive disorder, recurrent severe without psychotic features: Secondary | ICD-10-CM | POA: Diagnosis not present

## 2023-08-14 DIAGNOSIS — N179 Acute kidney failure, unspecified: Secondary | ICD-10-CM | POA: Diagnosis not present

## 2023-08-14 DIAGNOSIS — G8221 Paraplegia, complete: Secondary | ICD-10-CM | POA: Diagnosis not present

## 2023-08-14 DIAGNOSIS — F419 Anxiety disorder, unspecified: Secondary | ICD-10-CM | POA: Diagnosis not present

## 2023-08-14 DIAGNOSIS — E119 Type 2 diabetes mellitus without complications: Secondary | ICD-10-CM | POA: Diagnosis not present

## 2023-08-14 DIAGNOSIS — F329 Major depressive disorder, single episode, unspecified: Secondary | ICD-10-CM | POA: Diagnosis not present

## 2023-08-14 DIAGNOSIS — D518 Other vitamin B12 deficiency anemias: Secondary | ICD-10-CM | POA: Diagnosis not present

## 2023-08-14 DIAGNOSIS — E559 Vitamin D deficiency, unspecified: Secondary | ICD-10-CM | POA: Diagnosis not present

## 2023-08-14 DIAGNOSIS — E782 Mixed hyperlipidemia: Secondary | ICD-10-CM | POA: Diagnosis not present

## 2023-08-14 DIAGNOSIS — E038 Other specified hypothyroidism: Secondary | ICD-10-CM | POA: Diagnosis not present

## 2023-08-15 DIAGNOSIS — F332 Major depressive disorder, recurrent severe without psychotic features: Secondary | ICD-10-CM | POA: Diagnosis not present

## 2023-08-15 DIAGNOSIS — F411 Generalized anxiety disorder: Secondary | ICD-10-CM | POA: Diagnosis not present

## 2023-08-15 DIAGNOSIS — I10 Essential (primary) hypertension: Secondary | ICD-10-CM | POA: Diagnosis not present

## 2023-08-15 DIAGNOSIS — L89622 Pressure ulcer of left heel, stage 2: Secondary | ICD-10-CM | POA: Diagnosis not present

## 2023-08-15 DIAGNOSIS — Z23 Encounter for immunization: Secondary | ICD-10-CM | POA: Diagnosis not present

## 2023-08-15 DIAGNOSIS — F4323 Adjustment disorder with mixed anxiety and depressed mood: Secondary | ICD-10-CM | POA: Diagnosis not present

## 2023-08-15 DIAGNOSIS — G8221 Paraplegia, complete: Secondary | ICD-10-CM | POA: Diagnosis not present

## 2023-08-17 DIAGNOSIS — F332 Major depressive disorder, recurrent severe without psychotic features: Secondary | ICD-10-CM | POA: Diagnosis not present

## 2023-08-17 DIAGNOSIS — L89622 Pressure ulcer of left heel, stage 2: Secondary | ICD-10-CM | POA: Diagnosis not present

## 2023-08-17 DIAGNOSIS — F4323 Adjustment disorder with mixed anxiety and depressed mood: Secondary | ICD-10-CM | POA: Diagnosis not present

## 2023-08-17 DIAGNOSIS — G8221 Paraplegia, complete: Secondary | ICD-10-CM | POA: Diagnosis not present

## 2023-08-17 DIAGNOSIS — I10 Essential (primary) hypertension: Secondary | ICD-10-CM | POA: Diagnosis not present

## 2023-08-17 DIAGNOSIS — F411 Generalized anxiety disorder: Secondary | ICD-10-CM | POA: Diagnosis not present

## 2023-08-21 DIAGNOSIS — F332 Major depressive disorder, recurrent severe without psychotic features: Secondary | ICD-10-CM | POA: Diagnosis not present

## 2023-08-21 DIAGNOSIS — I10 Essential (primary) hypertension: Secondary | ICD-10-CM | POA: Diagnosis not present

## 2023-08-21 DIAGNOSIS — G8221 Paraplegia, complete: Secondary | ICD-10-CM | POA: Diagnosis not present

## 2023-08-21 DIAGNOSIS — F411 Generalized anxiety disorder: Secondary | ICD-10-CM | POA: Diagnosis not present

## 2023-08-21 DIAGNOSIS — F4323 Adjustment disorder with mixed anxiety and depressed mood: Secondary | ICD-10-CM | POA: Diagnosis not present

## 2023-08-21 DIAGNOSIS — L89622 Pressure ulcer of left heel, stage 2: Secondary | ICD-10-CM | POA: Diagnosis not present

## 2023-08-23 DIAGNOSIS — I10 Essential (primary) hypertension: Secondary | ICD-10-CM | POA: Diagnosis not present

## 2023-08-23 DIAGNOSIS — G8221 Paraplegia, complete: Secondary | ICD-10-CM | POA: Diagnosis not present

## 2023-08-23 DIAGNOSIS — F411 Generalized anxiety disorder: Secondary | ICD-10-CM | POA: Diagnosis not present

## 2023-08-23 DIAGNOSIS — F4323 Adjustment disorder with mixed anxiety and depressed mood: Secondary | ICD-10-CM | POA: Diagnosis not present

## 2023-08-23 DIAGNOSIS — F332 Major depressive disorder, recurrent severe without psychotic features: Secondary | ICD-10-CM | POA: Diagnosis not present

## 2023-08-23 DIAGNOSIS — L89622 Pressure ulcer of left heel, stage 2: Secondary | ICD-10-CM | POA: Diagnosis not present

## 2023-08-28 DIAGNOSIS — N319 Neuromuscular dysfunction of bladder, unspecified: Secondary | ICD-10-CM | POA: Diagnosis not present

## 2023-08-28 DIAGNOSIS — L89622 Pressure ulcer of left heel, stage 2: Secondary | ICD-10-CM | POA: Diagnosis not present

## 2023-08-28 DIAGNOSIS — G8221 Paraplegia, complete: Secondary | ICD-10-CM | POA: Diagnosis not present

## 2023-08-28 DIAGNOSIS — I10 Essential (primary) hypertension: Secondary | ICD-10-CM | POA: Diagnosis not present

## 2023-08-28 DIAGNOSIS — F332 Major depressive disorder, recurrent severe without psychotic features: Secondary | ICD-10-CM | POA: Diagnosis not present

## 2023-08-28 DIAGNOSIS — F419 Anxiety disorder, unspecified: Secondary | ICD-10-CM | POA: Diagnosis not present

## 2023-08-28 DIAGNOSIS — F32A Depression, unspecified: Secondary | ICD-10-CM | POA: Diagnosis not present

## 2023-08-28 DIAGNOSIS — K592 Neurogenic bowel, not elsewhere classified: Secondary | ICD-10-CM | POA: Diagnosis not present

## 2023-08-28 DIAGNOSIS — F4323 Adjustment disorder with mixed anxiety and depressed mood: Secondary | ICD-10-CM | POA: Diagnosis not present

## 2023-08-28 DIAGNOSIS — K581 Irritable bowel syndrome with constipation: Secondary | ICD-10-CM | POA: Diagnosis not present

## 2023-08-28 DIAGNOSIS — F411 Generalized anxiety disorder: Secondary | ICD-10-CM | POA: Diagnosis not present

## 2023-08-28 DIAGNOSIS — K219 Gastro-esophageal reflux disease without esophagitis: Secondary | ICD-10-CM | POA: Diagnosis not present

## 2023-08-31 DIAGNOSIS — F411 Generalized anxiety disorder: Secondary | ICD-10-CM | POA: Diagnosis not present

## 2023-08-31 DIAGNOSIS — L89622 Pressure ulcer of left heel, stage 2: Secondary | ICD-10-CM | POA: Diagnosis not present

## 2023-08-31 DIAGNOSIS — G8221 Paraplegia, complete: Secondary | ICD-10-CM | POA: Diagnosis not present

## 2023-08-31 DIAGNOSIS — F332 Major depressive disorder, recurrent severe without psychotic features: Secondary | ICD-10-CM | POA: Diagnosis not present

## 2023-08-31 DIAGNOSIS — I10 Essential (primary) hypertension: Secondary | ICD-10-CM | POA: Diagnosis not present

## 2023-08-31 DIAGNOSIS — F4323 Adjustment disorder with mixed anxiety and depressed mood: Secondary | ICD-10-CM | POA: Diagnosis not present

## 2023-09-02 DIAGNOSIS — I10 Essential (primary) hypertension: Secondary | ICD-10-CM | POA: Diagnosis not present

## 2023-09-08 DIAGNOSIS — I10 Essential (primary) hypertension: Secondary | ICD-10-CM | POA: Diagnosis not present

## 2023-09-08 DIAGNOSIS — G8221 Paraplegia, complete: Secondary | ICD-10-CM | POA: Diagnosis not present

## 2023-09-08 DIAGNOSIS — F4323 Adjustment disorder with mixed anxiety and depressed mood: Secondary | ICD-10-CM | POA: Diagnosis not present

## 2023-09-08 DIAGNOSIS — F332 Major depressive disorder, recurrent severe without psychotic features: Secondary | ICD-10-CM | POA: Diagnosis not present

## 2023-09-08 DIAGNOSIS — F411 Generalized anxiety disorder: Secondary | ICD-10-CM | POA: Diagnosis not present

## 2023-09-08 DIAGNOSIS — L89622 Pressure ulcer of left heel, stage 2: Secondary | ICD-10-CM | POA: Diagnosis not present

## 2023-09-11 DIAGNOSIS — I10 Essential (primary) hypertension: Secondary | ICD-10-CM | POA: Diagnosis not present

## 2023-09-11 DIAGNOSIS — F4323 Adjustment disorder with mixed anxiety and depressed mood: Secondary | ICD-10-CM | POA: Diagnosis not present

## 2023-09-11 DIAGNOSIS — L89622 Pressure ulcer of left heel, stage 2: Secondary | ICD-10-CM | POA: Diagnosis not present

## 2023-09-11 DIAGNOSIS — F332 Major depressive disorder, recurrent severe without psychotic features: Secondary | ICD-10-CM | POA: Diagnosis not present

## 2023-09-11 DIAGNOSIS — F411 Generalized anxiety disorder: Secondary | ICD-10-CM | POA: Diagnosis not present

## 2023-09-11 DIAGNOSIS — G8221 Paraplegia, complete: Secondary | ICD-10-CM | POA: Diagnosis not present

## 2023-09-12 DIAGNOSIS — J309 Allergic rhinitis, unspecified: Secondary | ICD-10-CM | POA: Diagnosis not present

## 2023-09-12 DIAGNOSIS — G8221 Paraplegia, complete: Secondary | ICD-10-CM | POA: Diagnosis not present

## 2023-09-12 DIAGNOSIS — Z7982 Long term (current) use of aspirin: Secondary | ICD-10-CM | POA: Diagnosis not present

## 2023-09-12 DIAGNOSIS — E559 Vitamin D deficiency, unspecified: Secondary | ICD-10-CM | POA: Diagnosis not present

## 2023-09-12 DIAGNOSIS — Z8546 Personal history of malignant neoplasm of prostate: Secondary | ICD-10-CM | POA: Diagnosis not present

## 2023-09-12 DIAGNOSIS — M545 Low back pain, unspecified: Secondary | ICD-10-CM | POA: Diagnosis not present

## 2023-09-12 DIAGNOSIS — F332 Major depressive disorder, recurrent severe without psychotic features: Secondary | ICD-10-CM | POA: Diagnosis not present

## 2023-09-12 DIAGNOSIS — M109 Gout, unspecified: Secondary | ICD-10-CM | POA: Diagnosis not present

## 2023-09-12 DIAGNOSIS — L89622 Pressure ulcer of left heel, stage 2: Secondary | ICD-10-CM | POA: Diagnosis not present

## 2023-09-12 DIAGNOSIS — F4323 Adjustment disorder with mixed anxiety and depressed mood: Secondary | ICD-10-CM | POA: Diagnosis not present

## 2023-09-12 DIAGNOSIS — K5904 Chronic idiopathic constipation: Secondary | ICD-10-CM | POA: Diagnosis not present

## 2023-09-12 DIAGNOSIS — Z87440 Personal history of urinary (tract) infections: Secondary | ICD-10-CM | POA: Diagnosis not present

## 2023-09-12 DIAGNOSIS — Z9181 History of falling: Secondary | ICD-10-CM | POA: Diagnosis not present

## 2023-09-12 DIAGNOSIS — K589 Irritable bowel syndrome without diarrhea: Secondary | ICD-10-CM | POA: Diagnosis not present

## 2023-09-12 DIAGNOSIS — N319 Neuromuscular dysfunction of bladder, unspecified: Secondary | ICD-10-CM | POA: Diagnosis not present

## 2023-09-12 DIAGNOSIS — I10 Essential (primary) hypertension: Secondary | ICD-10-CM | POA: Diagnosis not present

## 2023-09-12 DIAGNOSIS — G5601 Carpal tunnel syndrome, right upper limb: Secondary | ICD-10-CM | POA: Diagnosis not present

## 2023-09-12 DIAGNOSIS — H5319 Other subjective visual disturbances: Secondary | ICD-10-CM | POA: Diagnosis not present

## 2023-09-12 DIAGNOSIS — K219 Gastro-esophageal reflux disease without esophagitis: Secondary | ICD-10-CM | POA: Diagnosis not present

## 2023-09-12 DIAGNOSIS — G4733 Obstructive sleep apnea (adult) (pediatric): Secondary | ICD-10-CM | POA: Diagnosis not present

## 2023-09-12 DIAGNOSIS — Z556 Problems related to health literacy: Secondary | ICD-10-CM | POA: Diagnosis not present

## 2023-09-12 DIAGNOSIS — F411 Generalized anxiety disorder: Secondary | ICD-10-CM | POA: Diagnosis not present

## 2023-09-14 DIAGNOSIS — F411 Generalized anxiety disorder: Secondary | ICD-10-CM | POA: Diagnosis not present

## 2023-09-14 DIAGNOSIS — L89622 Pressure ulcer of left heel, stage 2: Secondary | ICD-10-CM | POA: Diagnosis not present

## 2023-09-14 DIAGNOSIS — G8221 Paraplegia, complete: Secondary | ICD-10-CM | POA: Diagnosis not present

## 2023-09-14 DIAGNOSIS — I10 Essential (primary) hypertension: Secondary | ICD-10-CM | POA: Diagnosis not present

## 2023-09-14 DIAGNOSIS — F4323 Adjustment disorder with mixed anxiety and depressed mood: Secondary | ICD-10-CM | POA: Diagnosis not present

## 2023-09-14 DIAGNOSIS — F332 Major depressive disorder, recurrent severe without psychotic features: Secondary | ICD-10-CM | POA: Diagnosis not present

## 2023-09-17 DIAGNOSIS — Z7401 Bed confinement status: Secondary | ICD-10-CM | POA: Diagnosis not present

## 2023-09-17 DIAGNOSIS — G8220 Paraplegia, unspecified: Secondary | ICD-10-CM | POA: Diagnosis not present

## 2023-09-17 DIAGNOSIS — Z043 Encounter for examination and observation following other accident: Secondary | ICD-10-CM | POA: Diagnosis not present

## 2023-09-17 DIAGNOSIS — R001 Bradycardia, unspecified: Secondary | ICD-10-CM | POA: Diagnosis not present

## 2023-09-17 DIAGNOSIS — G311 Senile degeneration of brain, not elsewhere classified: Secondary | ICD-10-CM | POA: Diagnosis not present

## 2023-09-17 DIAGNOSIS — S0990XA Unspecified injury of head, initial encounter: Secondary | ICD-10-CM | POA: Diagnosis not present

## 2023-09-17 DIAGNOSIS — S0083XA Contusion of other part of head, initial encounter: Secondary | ICD-10-CM | POA: Diagnosis not present

## 2023-09-17 DIAGNOSIS — W19XXXA Unspecified fall, initial encounter: Secondary | ICD-10-CM | POA: Diagnosis not present

## 2023-09-17 DIAGNOSIS — S0291XA Unspecified fracture of skull, initial encounter for closed fracture: Secondary | ICD-10-CM | POA: Diagnosis not present

## 2023-09-19 DIAGNOSIS — G8221 Paraplegia, complete: Secondary | ICD-10-CM | POA: Diagnosis not present

## 2023-09-19 DIAGNOSIS — I10 Essential (primary) hypertension: Secondary | ICD-10-CM | POA: Diagnosis not present

## 2023-09-19 DIAGNOSIS — F4323 Adjustment disorder with mixed anxiety and depressed mood: Secondary | ICD-10-CM | POA: Diagnosis not present

## 2023-09-19 DIAGNOSIS — F411 Generalized anxiety disorder: Secondary | ICD-10-CM | POA: Diagnosis not present

## 2023-09-19 DIAGNOSIS — F332 Major depressive disorder, recurrent severe without psychotic features: Secondary | ICD-10-CM | POA: Diagnosis not present

## 2023-09-19 DIAGNOSIS — L89622 Pressure ulcer of left heel, stage 2: Secondary | ICD-10-CM | POA: Diagnosis not present

## 2023-09-20 DIAGNOSIS — F329 Major depressive disorder, single episode, unspecified: Secondary | ICD-10-CM | POA: Diagnosis not present

## 2023-09-20 DIAGNOSIS — F419 Anxiety disorder, unspecified: Secondary | ICD-10-CM | POA: Diagnosis not present

## 2023-09-20 DIAGNOSIS — R4189 Other symptoms and signs involving cognitive functions and awareness: Secondary | ICD-10-CM | POA: Diagnosis not present

## 2023-09-21 DIAGNOSIS — F332 Major depressive disorder, recurrent severe without psychotic features: Secondary | ICD-10-CM | POA: Diagnosis not present

## 2023-09-21 DIAGNOSIS — F4323 Adjustment disorder with mixed anxiety and depressed mood: Secondary | ICD-10-CM | POA: Diagnosis not present

## 2023-09-21 DIAGNOSIS — I10 Essential (primary) hypertension: Secondary | ICD-10-CM | POA: Diagnosis not present

## 2023-09-21 DIAGNOSIS — F411 Generalized anxiety disorder: Secondary | ICD-10-CM | POA: Diagnosis not present

## 2023-09-21 DIAGNOSIS — L89622 Pressure ulcer of left heel, stage 2: Secondary | ICD-10-CM | POA: Diagnosis not present

## 2023-09-21 DIAGNOSIS — G8221 Paraplegia, complete: Secondary | ICD-10-CM | POA: Diagnosis not present

## 2023-09-22 DIAGNOSIS — D62 Acute posthemorrhagic anemia: Secondary | ICD-10-CM | POA: Diagnosis not present

## 2023-09-22 DIAGNOSIS — N179 Acute kidney failure, unspecified: Secondary | ICD-10-CM | POA: Diagnosis not present

## 2023-09-22 DIAGNOSIS — G8221 Paraplegia, complete: Secondary | ICD-10-CM | POA: Diagnosis not present

## 2023-09-22 DIAGNOSIS — E038 Other specified hypothyroidism: Secondary | ICD-10-CM | POA: Diagnosis not present

## 2023-09-22 DIAGNOSIS — F332 Major depressive disorder, recurrent severe without psychotic features: Secondary | ICD-10-CM | POA: Diagnosis not present

## 2023-09-22 DIAGNOSIS — E559 Vitamin D deficiency, unspecified: Secondary | ICD-10-CM | POA: Diagnosis not present

## 2023-09-22 DIAGNOSIS — E782 Mixed hyperlipidemia: Secondary | ICD-10-CM | POA: Diagnosis not present

## 2023-09-22 DIAGNOSIS — D518 Other vitamin B12 deficiency anemias: Secondary | ICD-10-CM | POA: Diagnosis not present

## 2023-09-22 DIAGNOSIS — E119 Type 2 diabetes mellitus without complications: Secondary | ICD-10-CM | POA: Diagnosis not present

## 2023-09-22 DIAGNOSIS — L89153 Pressure ulcer of sacral region, stage 3: Secondary | ICD-10-CM | POA: Diagnosis not present

## 2023-09-25 DIAGNOSIS — M6281 Muscle weakness (generalized): Secondary | ICD-10-CM | POA: Diagnosis not present

## 2023-09-25 DIAGNOSIS — F332 Major depressive disorder, recurrent severe without psychotic features: Secondary | ICD-10-CM | POA: Diagnosis not present

## 2023-09-25 DIAGNOSIS — N319 Neuromuscular dysfunction of bladder, unspecified: Secondary | ICD-10-CM | POA: Diagnosis not present

## 2023-09-25 DIAGNOSIS — F411 Generalized anxiety disorder: Secondary | ICD-10-CM | POA: Diagnosis not present

## 2023-09-25 DIAGNOSIS — F4323 Adjustment disorder with mixed anxiety and depressed mood: Secondary | ICD-10-CM | POA: Diagnosis not present

## 2023-09-25 DIAGNOSIS — F32A Depression, unspecified: Secondary | ICD-10-CM | POA: Diagnosis not present

## 2023-09-25 DIAGNOSIS — I10 Essential (primary) hypertension: Secondary | ICD-10-CM | POA: Diagnosis not present

## 2023-09-25 DIAGNOSIS — K592 Neurogenic bowel, not elsewhere classified: Secondary | ICD-10-CM | POA: Diagnosis not present

## 2023-09-25 DIAGNOSIS — L89622 Pressure ulcer of left heel, stage 2: Secondary | ICD-10-CM | POA: Diagnosis not present

## 2023-09-25 DIAGNOSIS — K219 Gastro-esophageal reflux disease without esophagitis: Secondary | ICD-10-CM | POA: Diagnosis not present

## 2023-09-25 DIAGNOSIS — L89899 Pressure ulcer of other site, unspecified stage: Secondary | ICD-10-CM | POA: Diagnosis not present

## 2023-09-25 DIAGNOSIS — G8221 Paraplegia, complete: Secondary | ICD-10-CM | POA: Diagnosis not present

## 2023-09-28 DIAGNOSIS — I10 Essential (primary) hypertension: Secondary | ICD-10-CM | POA: Diagnosis not present

## 2023-09-28 DIAGNOSIS — G8221 Paraplegia, complete: Secondary | ICD-10-CM | POA: Diagnosis not present

## 2023-09-28 DIAGNOSIS — F332 Major depressive disorder, recurrent severe without psychotic features: Secondary | ICD-10-CM | POA: Diagnosis not present

## 2023-09-28 DIAGNOSIS — F411 Generalized anxiety disorder: Secondary | ICD-10-CM | POA: Diagnosis not present

## 2023-09-28 DIAGNOSIS — L89622 Pressure ulcer of left heel, stage 2: Secondary | ICD-10-CM | POA: Diagnosis not present

## 2023-09-28 DIAGNOSIS — F4323 Adjustment disorder with mixed anxiety and depressed mood: Secondary | ICD-10-CM | POA: Diagnosis not present

## 2023-10-01 DIAGNOSIS — I10 Essential (primary) hypertension: Secondary | ICD-10-CM | POA: Diagnosis not present

## 2023-10-01 DIAGNOSIS — G8221 Paraplegia, complete: Secondary | ICD-10-CM | POA: Diagnosis not present

## 2023-10-01 DIAGNOSIS — F4323 Adjustment disorder with mixed anxiety and depressed mood: Secondary | ICD-10-CM | POA: Diagnosis not present

## 2023-10-01 DIAGNOSIS — F332 Major depressive disorder, recurrent severe without psychotic features: Secondary | ICD-10-CM | POA: Diagnosis not present

## 2023-10-01 DIAGNOSIS — L89622 Pressure ulcer of left heel, stage 2: Secondary | ICD-10-CM | POA: Diagnosis not present

## 2023-10-01 DIAGNOSIS — F411 Generalized anxiety disorder: Secondary | ICD-10-CM | POA: Diagnosis not present

## 2023-10-02 DIAGNOSIS — G8221 Paraplegia, complete: Secondary | ICD-10-CM | POA: Diagnosis not present

## 2023-10-02 DIAGNOSIS — G8310 Monoplegia of lower limb affecting unspecified side: Secondary | ICD-10-CM | POA: Diagnosis not present

## 2023-10-03 DIAGNOSIS — I10 Essential (primary) hypertension: Secondary | ICD-10-CM | POA: Diagnosis not present

## 2023-10-05 DIAGNOSIS — L89622 Pressure ulcer of left heel, stage 2: Secondary | ICD-10-CM | POA: Diagnosis not present

## 2023-10-05 DIAGNOSIS — F411 Generalized anxiety disorder: Secondary | ICD-10-CM | POA: Diagnosis not present

## 2023-10-05 DIAGNOSIS — G8221 Paraplegia, complete: Secondary | ICD-10-CM | POA: Diagnosis not present

## 2023-10-05 DIAGNOSIS — I10 Essential (primary) hypertension: Secondary | ICD-10-CM | POA: Diagnosis not present

## 2023-10-05 DIAGNOSIS — F332 Major depressive disorder, recurrent severe without psychotic features: Secondary | ICD-10-CM | POA: Diagnosis not present

## 2023-10-05 DIAGNOSIS — F4323 Adjustment disorder with mixed anxiety and depressed mood: Secondary | ICD-10-CM | POA: Diagnosis not present

## 2023-10-08 DIAGNOSIS — I10 Essential (primary) hypertension: Secondary | ICD-10-CM | POA: Diagnosis not present

## 2023-10-08 DIAGNOSIS — L89622 Pressure ulcer of left heel, stage 2: Secondary | ICD-10-CM | POA: Diagnosis not present

## 2023-10-08 DIAGNOSIS — F332 Major depressive disorder, recurrent severe without psychotic features: Secondary | ICD-10-CM | POA: Diagnosis not present

## 2023-10-08 DIAGNOSIS — G8221 Paraplegia, complete: Secondary | ICD-10-CM | POA: Diagnosis not present

## 2023-10-08 DIAGNOSIS — F4323 Adjustment disorder with mixed anxiety and depressed mood: Secondary | ICD-10-CM | POA: Diagnosis not present

## 2023-10-08 DIAGNOSIS — F411 Generalized anxiety disorder: Secondary | ICD-10-CM | POA: Diagnosis not present

## 2023-10-10 DIAGNOSIS — N390 Urinary tract infection, site not specified: Secondary | ICD-10-CM | POA: Diagnosis not present

## 2023-10-15 DIAGNOSIS — E860 Dehydration: Secondary | ICD-10-CM | POA: Diagnosis not present

## 2023-10-15 DIAGNOSIS — R739 Hyperglycemia, unspecified: Secondary | ICD-10-CM | POA: Diagnosis not present

## 2023-10-16 DIAGNOSIS — F329 Major depressive disorder, single episode, unspecified: Secondary | ICD-10-CM | POA: Diagnosis not present

## 2023-10-16 DIAGNOSIS — F419 Anxiety disorder, unspecified: Secondary | ICD-10-CM | POA: Diagnosis not present

## 2023-10-16 DIAGNOSIS — E860 Dehydration: Secondary | ICD-10-CM | POA: Diagnosis not present

## 2023-10-16 DIAGNOSIS — R4189 Other symptoms and signs involving cognitive functions and awareness: Secondary | ICD-10-CM | POA: Diagnosis not present

## 2023-10-18 DIAGNOSIS — X500XXA Overexertion from strenuous movement or load, initial encounter: Secondary | ICD-10-CM | POA: Diagnosis not present

## 2023-10-18 DIAGNOSIS — S7291XB Unspecified fracture of right femur, initial encounter for open fracture type I or II: Secondary | ICD-10-CM | POA: Diagnosis not present

## 2023-10-18 DIAGNOSIS — S72301B Unspecified fracture of shaft of right femur, initial encounter for open fracture type I or II: Secondary | ICD-10-CM | POA: Diagnosis not present

## 2023-10-18 DIAGNOSIS — S72391A Other fracture of shaft of right femur, initial encounter for closed fracture: Secondary | ICD-10-CM | POA: Diagnosis not present

## 2023-10-18 DIAGNOSIS — E038 Other specified hypothyroidism: Secondary | ICD-10-CM | POA: Diagnosis not present

## 2023-10-18 DIAGNOSIS — Z993 Dependence on wheelchair: Secondary | ICD-10-CM | POA: Diagnosis not present

## 2023-10-18 DIAGNOSIS — M7989 Other specified soft tissue disorders: Secondary | ICD-10-CM | POA: Diagnosis not present

## 2023-10-18 DIAGNOSIS — R609 Edema, unspecified: Secondary | ICD-10-CM | POA: Diagnosis not present

## 2023-10-18 DIAGNOSIS — E119 Type 2 diabetes mellitus without complications: Secondary | ICD-10-CM | POA: Diagnosis not present

## 2023-10-18 DIAGNOSIS — E559 Vitamin D deficiency, unspecified: Secondary | ICD-10-CM | POA: Diagnosis not present

## 2023-10-18 DIAGNOSIS — R32 Unspecified urinary incontinence: Secondary | ICD-10-CM | POA: Diagnosis not present

## 2023-10-18 DIAGNOSIS — E782 Mixed hyperlipidemia: Secondary | ICD-10-CM | POA: Diagnosis not present

## 2023-10-18 DIAGNOSIS — G8220 Paraplegia, unspecified: Secondary | ICD-10-CM | POA: Diagnosis not present

## 2023-10-18 DIAGNOSIS — R208 Other disturbances of skin sensation: Secondary | ICD-10-CM | POA: Diagnosis not present

## 2023-10-19 DIAGNOSIS — R32 Unspecified urinary incontinence: Secondary | ICD-10-CM | POA: Diagnosis not present

## 2023-10-19 DIAGNOSIS — G8220 Paraplegia, unspecified: Secondary | ICD-10-CM | POA: Diagnosis not present

## 2023-10-19 DIAGNOSIS — S72301B Unspecified fracture of shaft of right femur, initial encounter for open fracture type I or II: Secondary | ICD-10-CM | POA: Diagnosis not present

## 2023-10-20 DIAGNOSIS — R32 Unspecified urinary incontinence: Secondary | ICD-10-CM | POA: Diagnosis not present

## 2023-10-20 DIAGNOSIS — G8220 Paraplegia, unspecified: Secondary | ICD-10-CM | POA: Diagnosis not present

## 2023-10-20 DIAGNOSIS — S72301B Unspecified fracture of shaft of right femur, initial encounter for open fracture type I or II: Secondary | ICD-10-CM | POA: Diagnosis not present

## 2023-10-21 DIAGNOSIS — S72301B Unspecified fracture of shaft of right femur, initial encounter for open fracture type I or II: Secondary | ICD-10-CM | POA: Diagnosis not present

## 2023-10-21 DIAGNOSIS — G8220 Paraplegia, unspecified: Secondary | ICD-10-CM | POA: Diagnosis not present

## 2023-10-21 DIAGNOSIS — R32 Unspecified urinary incontinence: Secondary | ICD-10-CM | POA: Diagnosis not present

## 2023-10-22 DIAGNOSIS — G8220 Paraplegia, unspecified: Secondary | ICD-10-CM | POA: Diagnosis not present

## 2023-10-22 DIAGNOSIS — S72301B Unspecified fracture of shaft of right femur, initial encounter for open fracture type I or II: Secondary | ICD-10-CM | POA: Diagnosis not present

## 2023-10-22 DIAGNOSIS — R32 Unspecified urinary incontinence: Secondary | ICD-10-CM | POA: Diagnosis not present

## 2023-10-23 DIAGNOSIS — R32 Unspecified urinary incontinence: Secondary | ICD-10-CM | POA: Diagnosis not present

## 2023-10-23 DIAGNOSIS — S72301B Unspecified fracture of shaft of right femur, initial encounter for open fracture type I or II: Secondary | ICD-10-CM | POA: Diagnosis not present

## 2023-10-23 DIAGNOSIS — G8220 Paraplegia, unspecified: Secondary | ICD-10-CM | POA: Diagnosis not present

## 2023-10-24 DIAGNOSIS — G8220 Paraplegia, unspecified: Secondary | ICD-10-CM | POA: Diagnosis not present

## 2023-10-24 DIAGNOSIS — R32 Unspecified urinary incontinence: Secondary | ICD-10-CM | POA: Diagnosis not present

## 2023-10-24 DIAGNOSIS — S72301B Unspecified fracture of shaft of right femur, initial encounter for open fracture type I or II: Secondary | ICD-10-CM | POA: Diagnosis not present

## 2023-10-25 DIAGNOSIS — R32 Unspecified urinary incontinence: Secondary | ICD-10-CM | POA: Diagnosis not present

## 2023-10-25 DIAGNOSIS — G8220 Paraplegia, unspecified: Secondary | ICD-10-CM | POA: Diagnosis not present

## 2023-10-25 DIAGNOSIS — S72301B Unspecified fracture of shaft of right femur, initial encounter for open fracture type I or II: Secondary | ICD-10-CM | POA: Diagnosis not present

## 2023-10-26 DIAGNOSIS — R32 Unspecified urinary incontinence: Secondary | ICD-10-CM | POA: Diagnosis not present

## 2023-10-26 DIAGNOSIS — S72301B Unspecified fracture of shaft of right femur, initial encounter for open fracture type I or II: Secondary | ICD-10-CM | POA: Diagnosis not present

## 2023-10-26 DIAGNOSIS — G8220 Paraplegia, unspecified: Secondary | ICD-10-CM | POA: Diagnosis not present

## 2023-10-27 DIAGNOSIS — G8220 Paraplegia, unspecified: Secondary | ICD-10-CM | POA: Diagnosis not present

## 2023-10-27 DIAGNOSIS — R32 Unspecified urinary incontinence: Secondary | ICD-10-CM | POA: Diagnosis not present

## 2023-10-27 DIAGNOSIS — S72301B Unspecified fracture of shaft of right femur, initial encounter for open fracture type I or II: Secondary | ICD-10-CM | POA: Diagnosis not present

## 2023-10-28 DIAGNOSIS — G8220 Paraplegia, unspecified: Secondary | ICD-10-CM | POA: Diagnosis not present

## 2023-10-28 DIAGNOSIS — R32 Unspecified urinary incontinence: Secondary | ICD-10-CM | POA: Diagnosis not present

## 2023-10-28 DIAGNOSIS — S72301B Unspecified fracture of shaft of right femur, initial encounter for open fracture type I or II: Secondary | ICD-10-CM | POA: Diagnosis not present

## 2023-10-29 DIAGNOSIS — S72301B Unspecified fracture of shaft of right femur, initial encounter for open fracture type I or II: Secondary | ICD-10-CM | POA: Diagnosis not present

## 2023-10-29 DIAGNOSIS — G8220 Paraplegia, unspecified: Secondary | ICD-10-CM | POA: Diagnosis not present

## 2023-10-29 DIAGNOSIS — R32 Unspecified urinary incontinence: Secondary | ICD-10-CM | POA: Diagnosis not present

## 2023-10-30 DIAGNOSIS — R32 Unspecified urinary incontinence: Secondary | ICD-10-CM | POA: Diagnosis not present

## 2023-10-30 DIAGNOSIS — S72301B Unspecified fracture of shaft of right femur, initial encounter for open fracture type I or II: Secondary | ICD-10-CM | POA: Diagnosis not present

## 2023-10-30 DIAGNOSIS — G8220 Paraplegia, unspecified: Secondary | ICD-10-CM | POA: Diagnosis not present

## 2023-10-31 DIAGNOSIS — G8220 Paraplegia, unspecified: Secondary | ICD-10-CM | POA: Diagnosis not present

## 2023-10-31 DIAGNOSIS — R32 Unspecified urinary incontinence: Secondary | ICD-10-CM | POA: Diagnosis not present

## 2023-10-31 DIAGNOSIS — S72301B Unspecified fracture of shaft of right femur, initial encounter for open fracture type I or II: Secondary | ICD-10-CM | POA: Diagnosis not present

## 2023-11-01 DIAGNOSIS — S72301B Unspecified fracture of shaft of right femur, initial encounter for open fracture type I or II: Secondary | ICD-10-CM | POA: Diagnosis not present

## 2023-11-01 DIAGNOSIS — Z7401 Bed confinement status: Secondary | ICD-10-CM | POA: Diagnosis not present

## 2023-11-01 DIAGNOSIS — R32 Unspecified urinary incontinence: Secondary | ICD-10-CM | POA: Diagnosis not present

## 2023-11-01 DIAGNOSIS — R531 Weakness: Secondary | ICD-10-CM | POA: Diagnosis not present

## 2023-11-01 DIAGNOSIS — G8220 Paraplegia, unspecified: Secondary | ICD-10-CM | POA: Diagnosis not present

## 2023-11-02 DIAGNOSIS — I10 Essential (primary) hypertension: Secondary | ICD-10-CM | POA: Diagnosis not present

## 2023-11-06 DIAGNOSIS — S72001A Fracture of unspecified part of neck of right femur, initial encounter for closed fracture: Secondary | ICD-10-CM | POA: Diagnosis not present

## 2023-11-06 DIAGNOSIS — S7291XA Unspecified fracture of right femur, initial encounter for closed fracture: Secondary | ICD-10-CM | POA: Diagnosis not present

## 2023-12-10 ENCOUNTER — Ambulatory Visit: Payer: Self-pay | Admitting: Student

## 2023-12-10 DIAGNOSIS — Z01818 Encounter for other preprocedural examination: Secondary | ICD-10-CM

## 2023-12-14 NOTE — Progress Notes (Signed)
 Patient instructions faxed to [facility] with attention to nurse [facility]

## 2023-12-14 NOTE — Anesthesia Preprocedure Evaluation (Addendum)
 Anesthesia Evaluation  Patient identified by MRN, date of birth, ID band Patient awake    Reviewed: Allergy & Precautions, NPO status , Patient's Chart, lab work & pertinent test results  Airway Mallampati: III  TM Distance: >3 FB Neck ROM: Full    Dental  (+) Chipped, Missing,    Pulmonary sleep apnea    Pulmonary exam normal        Cardiovascular hypertension, Pt. 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 by postoperative paraplegia s/p removal of muscle hematoma, paraplegia with spastic left hemiparesis  Neuromuscular disease    GI/Hepatic Neg liver ROS, PUD,GERD  Medicated and Controlled,,  Endo/Other  negative endocrine ROS    Renal/GU Renal disease     Musculoskeletal Wheelchair-bound   Abdominal   Peds  Hematology negative hematology ROS (+)   Anesthesia Other Findings Right distal femur fracture  Reproductive/Obstetrics                             Anesthesia Physical Anesthesia Plan  ASA: 3  Anesthesia Plan: General   Post-op Pain Management:    Induction: Intravenous  PONV Risk Score and Plan: 2 and Ondansetron , Dexamethasone  and Treatment may vary due to age or medical condition  Airway Management Planned: Oral ETT and Video Laryngoscope Planned  Additional Equipment:   Intra-op Plan:   Post-operative Plan: Extubation in OR  Informed Consent: I have reviewed the patients History and Physical, chart, labs and discussed the procedure including the risks, benefits and alternatives for the proposed anesthesia with the patient or authorized representative who has indicated his/her understanding and acceptance.     Dental advisory given  Plan Discussed with: CRNA  Anesthesia Plan  Comments: (PAT note written 12/14/2023 by Allison Zelenak, PA-C. 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 Cholecystostomy tube placed 11/30/23 [facility])--by note does to being a poor surgical candidate because of femoral fracture and contraction, impeding access for laparoscopic cholecystectomy.  He has an FYI of DIFFICULT AIRWAY: Per Intubation Record from 04/20/22 ([facility] Care Everywhere):  Final Airway Details  Final airway type: endotracheal airway  Endotracheal tube type: ETT  Successful intubation technique: VL - Glidescope  Facilitating devices/methods: anterior pressure/BURP   Endotracheal tube insertion site: oral  Blade size: #3  ETT size: 7.0 mm  View (Cormack Lehane grade): grade I - visualization of entire laryngeal  aperture  Placement verified by: chest auscultation/breath sounds equal bilaterally  and capnometry/+EtCO2  Measured from: teeth  ETT to teeth (cm): 22   Number of attempts at approach: 1  Number of other approaches attempted: 0  Airway placement trauma: none      )       Anesthesia Quick Evaluation

## 2023-12-14 NOTE — Progress Notes (Addendum)
 --  SDW INSTRUCTIONS given:   Your procedure is scheduled on December 17, 2023.             Report to [facility] Main Entrance A at 5:30 A.M., and check in at the Admitting office.             Call this number if you have problems the morning of surgery:             [phone]               Remember:             Do not eat or drink after midnight the night before your surgery                     Take these medicines the morning of surgery with A SIP OF WATER  baclofen  (LIORESAL )  clonazePAM  (KLONOPIN )  oxybutynin  (DITROPAN )  tiZANidine  (ZANAFLEX )     As of today, STOP taking any Aspirin  (unless otherwise instructed by your surgeon) Aleve, Naproxen, Ibuprofen, Motrin, Advil, Goody's, BC's, all herbal medications, fish oil, and all vitamins.                       Do not wear jewelry, make up, or nail polish            Do not wear lotions, powders, perfumes/colognes, or deodorant.            Do not shave 48 hours prior to surgery.  Men may shave face and neck.            Do not bring valuables to the hospital.            [facility] is not responsible for any belongings or valuables.   Do NOT Smoke (Tobacco/Vaping) 24 hours prior to your procedure If you use a CPAP at night, you may bring all equipment for your overnight stay.   Contacts, glasses, dentures or bridgework may not be worn into surgery.      For patients admitted to the hospital, discharge time will be determined by your treatment team.   Patients discharged the day of surgery will not be allowed to drive home, and someone needs to stay with them for 24 hours.    NOTE to anesthesia:  HX of HTN,  OSA. Per nurse at facility patient has sacral wound to right buttocks and bilateral heels wounds and lower extremities are contractured.     Special instructions:   [facility]- Preparing For Surgery   Before surgery, you can play an important role. Because skin is not sterile, your skin needs to be as free of germs as  possible. You can reduce the number of germs on your skin by washing with CHG (chlorahexidine gluconate) Soap before surgery.  CHG is an antiseptic cleaner which kills germs and bonds with the skin to continue killing germs even after washing.     Oral Hygiene is also important to reduce your risk of infection.  Remember - BRUSH YOUR TEETH THE MORNING OF SURGERY WITH YOUR REGULAR TOOTHPASTE   Please do not use if you have an allergy to CHG or antibacterial soaps. If your skin becomes reddened/irritated stop using the CHG.  Do not shave (including legs and underarms) for at least 48 hours prior to first CHG shower. It is OK to shave your face.   Please follow these instructions carefully.  Shower the NIGHT BEFORE SURGERY and the MORNING OF SURGERY with DIAL Soap.    Pat yourself dry with a CLEAN TOWEL.   Wear CLEAN PAJAMAS to bed the night before surgery   Place CLEAN SHEETS on your bed the night of your first shower and DO NOT SLEEP WITH PETS.     Day of Surgery: Please shower morning of surgery  Wear Clean/Comfortable clothing the morning of surgery Do not apply any deodorants/lotions.

## 2023-12-14 NOTE — Progress Notes (Signed)
 Anesthesia Chart Review: David Kim  Case: [id number] Date/Time: 12/17/23 0715   Procedure: OPEN REDUCTION INTERNAL FIXATION (ORIF) DISTAL FEMUR FRACTURE (Right)   Anesthesia type: General   Pre-op diagnosis: Right distal femur fracture   [facility] RO[facility] / [facility]   Surgeons: Kendal Franky SQUIBB, MD       DISCUSSION: Patient is a 78 year old male scheduled for the above procedure.   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 Cholecystostomy tube placed 11/30/23 [facility])--by note does to being a poor surgical candidate because of femoral fracture and contraction, impeding access for laparoscopic cholecystectomy.  He has an FYI of DIFFICULT AIRWAY: Per Intubation Record from 04/20/22 ([facility] Care Everywhere):  Final Airway Details  Final airway type: endotracheal airway  Endotracheal tube type: ETT  Successful intubation technique: VL - Glidescope  Facilitating devices/methods: anterior pressure/BURP   Endotracheal tube insertion site: oral  Blade size: #3  ETT size: 7.0 mm  View (Cormack Lehane grade): grade I - visualization of entire laryngeal  aperture  Placement verified by: chest auscultation/breath sounds equal bilaterally  and capnometry/+EtCO2  Measured from: teeth  ETT to teeth (cm): 22   Number of attempts at approach: 1  Number of other approaches attempted: 0  Airway placement trauma: none    He resides a [employer],     LABS: For day of surgery as indicated. Last available labs are a year old, Cr 0.64 with normal H/H at that time.    IMAGES: PCXR 11/25/23 [facility], see  [facility]/PACS) FINDINGS: The cardiothymic silhouette is normal. Linear areas of atelectasis right middle and lower lung. Focal opacity in the left lower lung, could represent atelectasis, and/or consolidation. Blunting of the left CP angle. Right CP angle is not visualized. There is no pneumothorax. No acute osseous abnormality is present. Tortuous thoracic aorta IMPRESSION: Linear areas of atelectasis right middle and lower lung. Focal opacity in the left lower lung, could represent atelectasis, and/or consolidation.   Xray right femur 11/11/23 [facility], see [facility]/PACS): MPRESSION: Acute to subacute overriding fracture through the distal shaft of the femur. Cast in place. More chronic appearing impacted right femoral neck/intertrochanteric region fracture.    EKG: Last EKG noted is from 11/07/22: Normal sinus rhythm Cannot rule out Anterior infarct , age undetermined LOW VOLTAGE compared with ECG of 10/24/22 No significant change was found Confirmed by Lavona Agent ([id number]) on 11/08/2022 7:16:57 PM   CV: Echo 10/20/22: IMPRESSIONS   1. Left ventricular ejection fraction, by estimation, is 60 to 65%. The  left ventricle has normal function. The left ventricle has no regional  wall motion abnormalities. Left ventricular diastolic parameters are  indeterminate.   2. Right ventricular systolic function is normal. The right ventricular  size is normal. Tricuspid regurgitation signal is inadequate for assessing  PA pressure.   3. The mitral valve is normal in structure. No evidence of mitral valve  regurgitation.   4. The aortic valve is tricuspid. Aortic valve regurgitation is trivial.  Aortic valve sclerosis/calcification is present, without any evidence of  aortic stenosis.   5. The inferior vena cava is normal in size with greater than 50%  respiratory variability, suggesting right atrial pressure of 3 mmHg.     Past  Medical History:  Diagnosis Date   Allergic rhinitis     ALLERGIC RHINITIS 10/01/2007   Qualifier: Diagnosis of  By: Norleen MD, [place] ORN    Anxiety    Carpal tunnel syndrome    right   Depression    Depression    Erectile dysfunction    Fatigue    GERD (gastroesophageal reflux disease)    Gout    Hypertension    HYPOGONADISM 03/31/2008   Qualifier: Diagnosis of  By: Norleen MD, [place] ORN    IBS (irritable bowel syndrome)    Low back pain    OSA (obstructive sleep apnea)    Paraplegia (HCC) 04/23/2009   Qualifier: Diagnosis of  By: Norleen MD, [place] ORN    Peptic ulcer disease    PEPTIC ULCER DISEASE 10/01/2007   Qualifier: Diagnosis of  By: Norleen MD, [place] ORN    Prostate cancer [facility])    PROSTATE CANCER, HX OF 05/23/2007   Qualifier: Diagnosis of  By: Norleen MD, [place] ORN    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   Qualifier: Diagnosis of  By: Norleen MD, [place] ORN    Thoracic spinal cord injury (HCC) 03/12/2012    Past Surgical History:  Procedure Laterality Date   ARTERY AND TENDON REPAIR Left 09/06/2018   Procedure: ARTERY AND TENDON REPAIR;  Surgeon: Sissy Cough, MD;  Location: [facility] OR;  Service: Orthopedics;  Laterality: Left;   CAST APPLICATION Left 09/06/2018   Procedure: CAST APPLICATION;  Surgeon: Sissy Cough, MD;  Location: [facility] OR;  Service: Orthopedics;  Laterality: Left;   inguinal heniorrhaphy     left leg surgery after fibula     NERVE REPAIR Left 09/06/2018   Procedure: NERVE REPAIR TIMES TWO;  Surgeon: Sissy Cough, MD;  Location: [facility] OR;  Service: Orthopedics;  Laterality: Left;   OPEN REDUCTION INTERNAL FIXATION (ORIF) DISTAL PHALANX Left 09/06/2018   Procedure: OPEN REDUCTION INTERNAL FIXATION (ORIF) LEFT LONG FINGER;  Surgeon: Sissy Cough, MD;  Location: [facility] OR;  Service: Orthopedics;  Laterality: Left;   PILONIDAL CYST / SINUS EXCISION     PROSTATECTOMY     tonsillectomy     WOUND EXPLORATION Left 09/06/2018   Procedure: EXPLORATION OFCOMPLEX INJURY;  Surgeon: Sissy Cough, MD;  Location: [facility] OR;  Service: Orthopedics;   Laterality: Left;    MEDICATIONS: No current facility-administered medications for this encounter.    magnesium  oxide (MAG-OX) 400 (240 Mg) MG tablet   acetaminophen  (TYLENOL ) 325 MG tablet   amLODipine  (NORVASC ) 10 MG tablet   aspirin  81 MG EC tablet   baclofen  (LIORESAL ) 20 MG tablet   calcium  citrate-vitamin D  500-500 MG-UNIT chewable tablet   cholecalciferol  (VITAMIN D3) 25 MCG (1000 UT) tablet   clonazePAM  (KLONOPIN ) 0.5 MG tablet   docusate sodium  (COLACE) 100 MG capsule   haloperidol  (HALDOL ) 2 MG tablet   leptospermum manuka honey (MEDIHONEY) PSTE paste   losartan  (COZAAR ) 25 MG tablet   melatonin 10 MG TABS   Multiple Vitamin (MULTIVITAMIN) capsule   oxybutynin  (DITROPAN ) 5 MG tablet   pantoprazole  (PROTONIX ) 40 MG tablet   polyethylene glycol (MIRALAX  / GLYCOLAX ) 17 g packet   potassium chloride  SA (KLOR-CON  M) 20 MEQ tablet   risperiDONE  (RISPERDAL ) 0.25 MG tablet   senna-docusate (SENOKOT-S) 8.6-50 MG tablet   tiZANidine  (ZANAFLEX ) 4 MG tablet   vitamin C  (ASCORBIC ACID ) 500 MG tablet   zinc  gluconate 50 MG tablet     Isaiah Ruder, PA-C Surgical Short Stay/Anesthesiology [facility] Phone [phone] [employer] Phone [phone]  12/14/2023 4:07 PM

## 2023-12-14 NOTE — Progress Notes (Signed)
 Medications reviewed with [facility] nurse at [facility].  Nurse also reported would to right buttock and bilateral heels

## 2023-12-17 ENCOUNTER — Encounter (HOSPITAL_COMMUNITY): Payer: Self-pay | Admitting: Student

## 2023-12-17 ENCOUNTER — Inpatient Hospital Stay (HOSPITAL_COMMUNITY)
Admission: RE | Admit: 2023-12-17 | Discharge: 2023-12-18 | DRG: 480 | Disposition: A | Discharge status: Skilled Nursing Facility | Payer: Medicare Other | Source: Ambulatory Visit | Attending: Student | Admitting: Student

## 2023-12-17 ENCOUNTER — Inpatient Hospital Stay (HOSPITAL_COMMUNITY): Payer: Self-pay | Admitting: Vascular Surgery

## 2023-12-17 ENCOUNTER — Inpatient Hospital Stay (HOSPITAL_COMMUNITY): Payer: Medicare Other

## 2023-12-17 ENCOUNTER — Other Ambulatory Visit: Payer: Self-pay

## 2023-12-17 ENCOUNTER — Encounter (HOSPITAL_COMMUNITY)
Admission: RE | Disposition: A | Discharge status: Skilled Nursing Facility | Payer: Self-pay | Source: Ambulatory Visit | Attending: Student

## 2023-12-17 DIAGNOSIS — I10 Essential (primary) hypertension: Secondary | ICD-10-CM | POA: Diagnosis present

## 2023-12-17 DIAGNOSIS — G4733 Obstructive sleep apnea (adult) (pediatric): Secondary | ICD-10-CM | POA: Diagnosis present

## 2023-12-17 DIAGNOSIS — K219 Gastro-esophageal reflux disease without esophagitis: Secondary | ICD-10-CM | POA: Diagnosis present

## 2023-12-17 DIAGNOSIS — Z888 Allergy status to other drugs, medicaments and biological substances status: Secondary | ICD-10-CM

## 2023-12-17 DIAGNOSIS — E785 Hyperlipidemia, unspecified: Secondary | ICD-10-CM | POA: Diagnosis not present

## 2023-12-17 DIAGNOSIS — X501XXA Overexertion from prolonged static or awkward postures, initial encounter: Secondary | ICD-10-CM | POA: Diagnosis not present

## 2023-12-17 DIAGNOSIS — Z79899 Other long term (current) drug therapy: Secondary | ICD-10-CM | POA: Diagnosis not present

## 2023-12-17 DIAGNOSIS — S72401P Unspecified fracture of lower end of right femur, subsequent encounter for closed fracture with malunion: Secondary | ICD-10-CM | POA: Diagnosis not present

## 2023-12-17 DIAGNOSIS — Z7982 Long term (current) use of aspirin: Secondary | ICD-10-CM | POA: Diagnosis not present

## 2023-12-17 DIAGNOSIS — Z833 Family history of diabetes mellitus: Secondary | ICD-10-CM | POA: Diagnosis not present

## 2023-12-17 DIAGNOSIS — Y929 Unspecified place or not applicable: Secondary | ICD-10-CM | POA: Diagnosis not present

## 2023-12-17 DIAGNOSIS — Z01818 Encounter for other preprocedural examination: Secondary | ICD-10-CM

## 2023-12-17 DIAGNOSIS — F419 Anxiety disorder, unspecified: Secondary | ICD-10-CM | POA: Diagnosis present

## 2023-12-17 DIAGNOSIS — S72401B Unspecified fracture of lower end of right femur, initial encounter for open fracture type I or II: Principal | ICD-10-CM | POA: Diagnosis present

## 2023-12-17 DIAGNOSIS — Z993 Dependence on wheelchair: Secondary | ICD-10-CM

## 2023-12-17 DIAGNOSIS — L89893 Pressure ulcer of other site, stage 3: Secondary | ICD-10-CM | POA: Diagnosis present

## 2023-12-17 DIAGNOSIS — S72401A Unspecified fracture of lower end of right femur, initial encounter for closed fracture: Principal | ICD-10-CM | POA: Diagnosis present

## 2023-12-17 DIAGNOSIS — K589 Irritable bowel syndrome without diarrhea: Secondary | ICD-10-CM | POA: Diagnosis present

## 2023-12-17 DIAGNOSIS — Z8546 Personal history of malignant neoplasm of prostate: Secondary | ICD-10-CM | POA: Diagnosis not present

## 2023-12-17 DIAGNOSIS — F32A Depression, unspecified: Secondary | ICD-10-CM | POA: Diagnosis present

## 2023-12-17 DIAGNOSIS — G8220 Paraplegia, unspecified: Secondary | ICD-10-CM | POA: Diagnosis present

## 2023-12-17 DIAGNOSIS — L989 Disorder of the skin and subcutaneous tissue, unspecified: Secondary | ICD-10-CM

## 2023-12-17 DIAGNOSIS — Z880 Allergy status to penicillin: Secondary | ICD-10-CM | POA: Diagnosis not present

## 2023-12-17 DIAGNOSIS — L89150 Pressure ulcer of sacral region, unstageable: Secondary | ICD-10-CM | POA: Diagnosis present

## 2023-12-17 HISTORY — PX: ORIF FEMUR FRACTURE: SHX2119

## 2023-12-17 LAB — CBC WITH DIFFERENTIAL/PLATELET
Abs Immature Granulocytes: 0.09 10*3/uL — ABNORMAL HIGH (ref 0.00–0.07)
Basophils Absolute: 0.1 10*3/uL (ref 0.0–0.1)
Basophils Relative: 1 %
Eosinophils Absolute: 0.8 10*3/uL — ABNORMAL HIGH (ref 0.0–0.5)
Eosinophils Relative: 7 %
HCT: 42.6 % (ref 39.0–52.0)
Hemoglobin: 13 g/dL (ref 13.0–17.0)
Immature Granulocytes: 1 %
Lymphocytes Relative: 11 %
Lymphs Abs: 1.3 10*3/uL (ref 0.7–4.0)
MCH: 26.7 pg (ref 26.0–34.0)
MCHC: 30.5 g/dL (ref 30.0–36.0)
MCV: 87.7 fL (ref 80.0–100.0)
Monocytes Absolute: 0.7 10*3/uL (ref 0.1–1.0)
Monocytes Relative: 6 %
Neutro Abs: 8.3 10*3/uL — ABNORMAL HIGH (ref 1.7–7.7)
Neutrophils Relative %: 74 %
Platelets: 308 10*3/uL (ref 150–400)
RBC: 4.86 MIL/uL (ref 4.22–5.81)
RDW: 16.7 % — ABNORMAL HIGH (ref 11.5–15.5)
WBC: 11.3 10*3/uL — ABNORMAL HIGH (ref 4.0–10.5)
nRBC: 0 % (ref 0.0–0.2)

## 2023-12-17 LAB — CBC
HCT: 40.1 % (ref 39.0–52.0)
Hemoglobin: 12.2 g/dL — ABNORMAL LOW (ref 13.0–17.0)
MCH: 26.5 pg (ref 26.0–34.0)
MCHC: 30.4 g/dL (ref 30.0–36.0)
MCV: 87 fL (ref 80.0–100.0)
Platelets: 351 10*3/uL (ref 150–400)
RBC: 4.61 MIL/uL (ref 4.22–5.81)
RDW: 16.6 % — ABNORMAL HIGH (ref 11.5–15.5)
WBC: 20.1 10*3/uL — ABNORMAL HIGH (ref 4.0–10.5)
nRBC: 0 % (ref 0.0–0.2)

## 2023-12-17 LAB — BASIC METABOLIC PANEL
Anion gap: 15 (ref 5–15)
BUN: 14 mg/dL (ref 8–23)
CO2: 21 mmol/L — ABNORMAL LOW (ref 22–32)
Calcium: 8.9 mg/dL (ref 8.9–10.3)
Chloride: 104 mmol/L (ref 98–111)
Creatinine, Ser: 0.51 mg/dL — ABNORMAL LOW (ref 0.61–1.24)
GFR, Estimated: >60 mL/min (ref >60)
Glucose, Bld: 100 mg/dL — ABNORMAL HIGH (ref 70–99)
Potassium: 3.6 mmol/L (ref 3.5–5.1)
Sodium: 140 mmol/L (ref 135–145)

## 2023-12-17 LAB — VITAMIN D 25 HYDROXY (VIT D DEFICIENCY, FRACTURES): Vit D, 25-Hydroxy: 69.65 ng/mL (ref 30–100)

## 2023-12-17 SURGERY — OPEN REDUCTION INTERNAL FIXATION (ORIF) DISTAL FEMUR FRACTURE
Anesthesia: General | Laterality: Right

## 2023-12-17 MED ORDER — MEDIHONEY WOUND/BURN DRESSING EX PSTE
1.0000 | PASTE | Freq: Every day | CUTANEOUS | Status: DC
  Filled 2023-12-17 (×2): qty 44

## 2023-12-17 MED ORDER — PHENYLEPHRINE HCL-NACL 20-0.9 MG/250ML-% IV SOLN
INTRAVENOUS | Status: DC | PRN
  Administered 2023-12-17: 30 ug/min via INTRAVENOUS

## 2023-12-17 MED ORDER — POTASSIUM CHLORIDE CRYS ER 20 MEQ PO TBCR
20.0000 meq | EXTENDED_RELEASE_TABLET | Freq: Every day | ORAL | Status: DC
  Administered 2023-12-17 – 2023-12-18 (×2): 20 meq via ORAL
  Filled 2023-12-17 (×2): qty 1

## 2023-12-17 MED ORDER — ZINC GLUCONATE 50 MG PO TABS
50.0000 mg | ORAL_TABLET | Freq: Every day | ORAL | Status: DC
Start: 2023-12-17 — End: 2023-12-17

## 2023-12-17 MED ORDER — PROPOFOL 10 MG/ML IV BOLUS
INTRAVENOUS | Status: AC
  Filled 2023-12-17: qty 20

## 2023-12-17 MED ORDER — CEFAZOLIN SODIUM-DEXTROSE 2-4 GM/100ML-% IV SOLN
2.0000 g | INTRAVENOUS | Status: AC
  Administered 2023-12-17: 2 g via INTRAVENOUS
  Filled 2023-12-17: qty 100

## 2023-12-17 MED ORDER — CEFAZOLIN SODIUM-DEXTROSE 2-4 GM/100ML-% IV SOLN
2.0000 g | Freq: Three times a day (TID) | INTRAVENOUS | Status: AC
  Administered 2023-12-17 – 2023-12-18 (×3): 2 g via INTRAVENOUS
  Filled 2023-12-17 (×3): qty 100

## 2023-12-17 MED ORDER — OXYBUTYNIN CHLORIDE 5 MG PO TABS
10.0000 mg | ORAL_TABLET | Freq: Every day | ORAL | Status: DC
  Administered 2023-12-17 – 2023-12-18 (×2): 10 mg via ORAL
  Filled 2023-12-17 (×2): qty 2

## 2023-12-17 MED ORDER — HYDRALAZINE HCL 10 MG PO TABS: 10.0000 mg | ORAL_TABLET | Freq: Four times a day (QID) | ORAL | Status: DC | PRN

## 2023-12-17 MED ORDER — SUGAMMADEX SODIUM 200 MG/2ML IV SOLN
INTRAVENOUS | Status: DC | PRN
  Administered 2023-12-17: 200 mg via INTRAVENOUS

## 2023-12-17 MED ORDER — ACETAMINOPHEN 325 MG PO TABS: 325.0000 mg | ORAL_TABLET | Freq: Four times a day (QID) | ORAL | Status: DC | PRN

## 2023-12-17 MED ORDER — FENTANYL CITRATE (PF) 250 MCG/5ML IJ SOLN
INTRAMUSCULAR | Status: DC | PRN
  Administered 2023-12-17: 100 ug via INTRAVENOUS
  Administered 2023-12-17 (×2): 50 ug via INTRAVENOUS

## 2023-12-17 MED ORDER — ORAL CARE MOUTH RINSE: 15.0000 mL | Freq: Once | OROMUCOSAL | Status: AC

## 2023-12-17 MED ORDER — DOCUSATE SODIUM 100 MG PO CAPS
100.0000 mg | ORAL_CAPSULE | Freq: Two times a day (BID) | ORAL | Status: DC
  Administered 2023-12-17 – 2023-12-18 (×3): 100 mg via ORAL
  Filled 2023-12-17 (×3): qty 1

## 2023-12-17 MED ORDER — RISPERIDONE 0.25 MG PO TABS
0.2500 mg | ORAL_TABLET | Freq: Every day | ORAL | Status: DC
  Administered 2023-12-17: 0.25 mg via ORAL
  Filled 2023-12-17: qty 1

## 2023-12-17 MED ORDER — ONDANSETRON HCL 4 MG PO TABS: 4.0000 mg | ORAL_TABLET | Freq: Four times a day (QID) | ORAL | Status: DC | PRN

## 2023-12-17 MED ORDER — PANTOPRAZOLE SODIUM 40 MG PO TBEC
40.0000 mg | DELAYED_RELEASE_TABLET | Freq: Every day | ORAL | Status: DC
  Administered 2023-12-17 – 2023-12-18 (×2): 40 mg via ORAL
  Filled 2023-12-17 (×2): qty 1

## 2023-12-17 MED ORDER — FENTANYL CITRATE (PF) 100 MCG/2ML IJ SOLN: 25.0000 ug | INTRAMUSCULAR | Status: DC | PRN

## 2023-12-17 MED ORDER — ONDANSETRON HCL 4 MG/2ML IJ SOLN
INTRAMUSCULAR | Status: DC | PRN
  Administered 2023-12-17: 4 mg via INTRAVENOUS

## 2023-12-17 MED ORDER — MORPHINE SULFATE (PF) 2 MG/ML IV SOLN: 1.0000 mg | INTRAVENOUS | Status: DC | PRN

## 2023-12-17 MED ORDER — VITAMIN D 25 MCG (1000 UNIT) PO TABS
1000.0000 [IU] | ORAL_TABLET | Freq: Every day | ORAL | Status: DC
  Administered 2023-12-17 – 2023-12-18 (×2): 1000 [IU] via ORAL
  Filled 2023-12-17 (×2): qty 1

## 2023-12-17 MED ORDER — BACLOFEN 10 MG PO TABS
20.0000 mg | ORAL_TABLET | Freq: Four times a day (QID) | ORAL | Status: DC
  Administered 2023-12-17 – 2023-12-18 (×5): 20 mg via ORAL
  Filled 2023-12-17 (×5): qty 2

## 2023-12-17 MED ORDER — MELATONIN 5 MG PO TABS
10.0000 mg | ORAL_TABLET | Freq: Every day | ORAL | Status: DC
  Administered 2023-12-17: 10 mg via ORAL
  Filled 2023-12-17: qty 2

## 2023-12-17 MED ORDER — OXYCODONE HCL 5 MG PO TABS: 5.0000 mg | ORAL_TABLET | ORAL | Status: DC | PRN

## 2023-12-17 MED ORDER — SODIUM CHLORIDE 0.9 % IV SOLN: INTRAVENOUS | Status: DC

## 2023-12-17 MED ORDER — ROCURONIUM BROMIDE 10 MG/ML (PF) SYRINGE
PREFILLED_SYRINGE | INTRAVENOUS | Status: DC | PRN
  Administered 2023-12-17: 50 mg via INTRAVENOUS

## 2023-12-17 MED ORDER — PROPOFOL 10 MG/ML IV BOLUS
INTRAVENOUS | Status: DC | PRN
  Administered 2023-12-17: 50 mg via INTRAVENOUS

## 2023-12-17 MED ORDER — ACETAMINOPHEN 10 MG/ML IV SOLN: 1000.0000 mg | Freq: Once | INTRAVENOUS | Status: DC | PRN

## 2023-12-17 MED ORDER — LACTATED RINGERS IV SOLN: INTRAVENOUS | Status: DC

## 2023-12-17 MED ORDER — TIZANIDINE HCL 4 MG PO TABS
4.0000 mg | ORAL_TABLET | Freq: Four times a day (QID) | ORAL | Status: DC
  Administered 2023-12-17 – 2023-12-18 (×6): 4 mg via ORAL
  Filled 2023-12-17 (×6): qty 1

## 2023-12-17 MED ORDER — ONDANSETRON HCL 4 MG/2ML IJ SOLN: 4.0000 mg | Freq: Once | INTRAMUSCULAR | Status: DC | PRN

## 2023-12-17 MED ORDER — VANCOMYCIN HCL 1000 MG IV SOLR
INTRAVENOUS | Status: AC
  Filled 2023-12-17: qty 20

## 2023-12-17 MED ORDER — ONDANSETRON HCL 4 MG/2ML IJ SOLN: 4.0000 mg | Freq: Four times a day (QID) | INTRAMUSCULAR | Status: DC | PRN

## 2023-12-17 MED ORDER — CLONAZEPAM 0.5 MG PO TABS
0.5000 mg | ORAL_TABLET | Freq: Two times a day (BID) | ORAL | Status: DC
  Administered 2023-12-17 – 2023-12-18 (×3): 0.5 mg via ORAL
  Filled 2023-12-17 (×3): qty 1

## 2023-12-17 MED ORDER — 0.9 % SODIUM CHLORIDE (POUR BTL) OPTIME
TOPICAL | Status: DC | PRN
  Administered 2023-12-17: 1000 mL

## 2023-12-17 MED ORDER — METOCLOPRAMIDE HCL 5 MG/ML IJ SOLN: 5.0000 mg | Freq: Three times a day (TID) | INTRAMUSCULAR | Status: DC | PRN

## 2023-12-17 MED ORDER — SENNOSIDES-DOCUSATE SODIUM 8.6-50 MG PO TABS
1.0000 | ORAL_TABLET | Freq: Two times a day (BID) | ORAL | Status: DC
  Administered 2023-12-17 – 2023-12-18 (×3): 1 via ORAL
  Filled 2023-12-17 (×3): qty 1

## 2023-12-17 MED ORDER — VANCOMYCIN HCL 1000 MG IV SOLR
INTRAVENOUS | Status: DC | PRN
  Administered 2023-12-17: 1000 mg via TOPICAL

## 2023-12-17 MED ORDER — DEXAMETHASONE SODIUM PHOSPHATE 10 MG/ML IJ SOLN
INTRAMUSCULAR | Status: DC | PRN
  Administered 2023-12-17: 10 mg via INTRAVENOUS

## 2023-12-17 MED ORDER — METOCLOPRAMIDE HCL 5 MG PO TABS: 5.0000 mg | ORAL_TABLET | Freq: Three times a day (TID) | ORAL | Status: DC | PRN

## 2023-12-17 MED ORDER — POLYETHYLENE GLYCOL 3350 17 G PO PACK
17.0000 g | PACK | Freq: Two times a day (BID) | ORAL | Status: DC
  Administered 2023-12-17 – 2023-12-18 (×3): 17 g via ORAL
  Filled 2023-12-17 (×3): qty 1

## 2023-12-17 MED ORDER — MAGNESIUM OXIDE -MG SUPPLEMENT 400 (240 MG) MG PO TABS
400.0000 mg | ORAL_TABLET | Freq: Every day | ORAL | Status: DC
  Administered 2023-12-17 – 2023-12-18 (×2): 400 mg via ORAL
  Filled 2023-12-17 (×2): qty 1

## 2023-12-17 MED ORDER — CHLORHEXIDINE GLUCONATE 0.12 % MT SOLN
15.0000 mL | Freq: Once | OROMUCOSAL | Status: AC
  Administered 2023-12-17: 15 mL via OROMUCOSAL
  Filled 2023-12-17: qty 15

## 2023-12-17 MED ORDER — LIDOCAINE 2% (20 MG/ML) 5 ML SYRINGE
INTRAMUSCULAR | Status: DC | PRN
  Administered 2023-12-17: 100 mg via INTRAVENOUS

## 2023-12-17 MED ORDER — LACTATED RINGERS IV SOLN: INTRAVENOUS | Status: DC | PRN

## 2023-12-17 MED ORDER — FENTANYL CITRATE (PF) 250 MCG/5ML IJ SOLN
INTRAMUSCULAR | Status: AC
  Filled 2023-12-17: qty 5

## 2023-12-17 MED ORDER — VITAMIN C 500 MG PO TABS
500.0000 mg | ORAL_TABLET | Freq: Every day | ORAL | Status: DC
  Administered 2023-12-17 – 2023-12-18 (×2): 500 mg via ORAL
  Filled 2023-12-17 (×2): qty 1

## 2023-12-17 MED ORDER — ASPIRIN 81 MG PO TBEC
81.0000 mg | DELAYED_RELEASE_TABLET | Freq: Every day | ORAL | Status: DC
  Administered 2023-12-17 – 2023-12-18 (×2): 81 mg via ORAL
  Filled 2023-12-17 (×2): qty 1

## 2023-12-17 MED ORDER — AMISULPRIDE (ANTIEMETIC) 5 MG/2ML IV SOLN: 10.0000 mg | Freq: Once | INTRAVENOUS | Status: DC | PRN

## 2023-12-17 SURGICAL SUPPLY — 64 items
BAG COUNTER SPONGE SURGICOUNT (BAG) ×1 IMPLANT
BIT DRILL 4.3 (BIT) ×1
BIT DRILL 4.3X300MM (BIT) IMPLANT
BIT DRILL LONG 3.3 (BIT) IMPLANT
BIT DRILL QC 3.3X195 (BIT) IMPLANT
BLADE SAG 18X100X1.27 (BLADE) IMPLANT
BNDG COHESIVE 6X5 TAN ST LF (GAUZE/BANDAGES/DRESSINGS) ×1 IMPLANT
BNDG ELASTIC 4X5.8 VLCR NS LF (GAUZE/BANDAGES/DRESSINGS) IMPLANT
BNDG ELASTIC 6X10 VLCR STRL LF (GAUZE/BANDAGES/DRESSINGS) ×1 IMPLANT
BRUSH SCRUB EZ PLAIN DRY (MISCELLANEOUS) ×2 IMPLANT
CANISTER SUCT 3000ML PPV (MISCELLANEOUS) ×1 IMPLANT
CAP LOCK NCB (Cap) IMPLANT
CHLORAPREP W/TINT 26 (MISCELLANEOUS) ×1 IMPLANT
COVER SURGICAL LIGHT HANDLE (MISCELLANEOUS) ×1 IMPLANT
DRAPE C-ARM 42X72 X-RAY (DRAPES) ×1 IMPLANT
DRAPE C-ARMOR (DRAPES) ×1 IMPLANT
DRAPE HALF SHEET 40X57 (DRAPES) ×2 IMPLANT
DRAPE SURG 17X23 STRL (DRAPES) ×1 IMPLANT
DRAPE SURG ORHT 6 SPLT 77X108 (DRAPES) ×2 IMPLANT
DRAPE U-SHAPE 47X51 STRL (DRAPES) ×1 IMPLANT
DRESSING MEPILEX FLEX 4X4 (GAUZE/BANDAGES/DRESSINGS) IMPLANT
DRSG ADAPTIC 3X8 NADH LF (GAUZE/BANDAGES/DRESSINGS) IMPLANT
DRSG MEPILEX FLEX 4X4 (GAUZE/BANDAGES/DRESSINGS) ×1 IMPLANT
DRSG MEPILEX POST OP 4X12 (GAUZE/BANDAGES/DRESSINGS) IMPLANT
DRSG MEPILEX POST OP 4X8 (GAUZE/BANDAGES/DRESSINGS) IMPLANT
ELECT REM PT RETURN 9FT ADLT (ELECTROSURGICAL) ×1 IMPLANT
ELECTRODE REM PT RTRN 9FT ADLT (ELECTROSURGICAL) ×1 IMPLANT
GAUZE PAD ABD 8X10 STRL (GAUZE/BANDAGES/DRESSINGS) ×3 IMPLANT
GAUZE SPONGE 4X4 12PLY STRL (GAUZE/BANDAGES/DRESSINGS) ×1 IMPLANT
GLOVE BIO SURGEON STRL SZ 6.5 (GLOVE) ×3 IMPLANT
GLOVE BIO SURGEON STRL SZ7.5 (GLOVE) ×4 IMPLANT
GLOVE BIOGEL PI IND STRL 6.5 (GLOVE) ×1 IMPLANT
GLOVE BIOGEL PI IND STRL 7.5 (GLOVE) ×1 IMPLANT
GOWN STRL REUS W/ TWL LRG LVL3 (GOWN DISPOSABLE) ×3 IMPLANT
K-WIRE FXSTD 280X2XNS SS (WIRE) ×1 IMPLANT
KIT BASIN OR (CUSTOM PROCEDURE TRAY) ×1 IMPLANT
KIT TURNOVER KIT B (KITS) ×1 IMPLANT
KWIRE FXSTD 280X2XNS SS (WIRE) IMPLANT
NS IRRIG 1000ML POUR BTL (IV SOLUTION) ×1 IMPLANT
PACK TOTAL JOINT (CUSTOM PROCEDURE TRAY) ×1 IMPLANT
PAD ARMBOARD 7.5X6 YLW CONV (MISCELLANEOUS) ×2 IMPLANT
PAD CAST 4YDX4 CTTN HI CHSV (CAST SUPPLIES) ×1 IMPLANT
PADDING CAST ABS COTTON 4X4 ST (CAST SUPPLIES) IMPLANT
PADDING CAST COTTON 6X4 STRL (CAST SUPPLIES) ×1 IMPLANT
PADDING CAST SYNTHETIC 6X4 NS (CAST SUPPLIES) IMPLANT
PLATE NCB PPP 9H (Plate) IMPLANT
SCREW 5.0 60MM (Screw) IMPLANT
SCREW 5.0 80MM (Screw) IMPLANT
SCREW CORTICAL NCB 5.0X40 (Screw) IMPLANT
SCREW NCB 4.0MX38M (Screw) IMPLANT
SCREW NCB 4.0MX55M (Screw) IMPLANT
SCREW NCB 5.0X38 (Screw) IMPLANT
SCREW NCB 5.0X85MM (Screw) IMPLANT
SPONGE T-LAP 18X18 ~~LOC~~+RFID (SPONGE) IMPLANT
STAPLER VISISTAT 35W (STAPLE) ×1 IMPLANT
SUCTION TUBE FRAZIER 10FR DISP (SUCTIONS) ×1 IMPLANT
SUT ETHILON 3 0 PS 1 (SUTURE) ×2 IMPLANT
SUT MON AB 2-0 CT1 36 (SUTURE) IMPLANT
SUT VIC AB 0 CT1 27XBRD ANBCTR (SUTURE) IMPLANT
SUT VIC AB 1 CT1 27XBRD ANBCTR (SUTURE) IMPLANT
SUT VIC AB 2-0 CT1 TAPERPNT 27 (SUTURE) ×2 IMPLANT
TOWEL GREEN STERILE (TOWEL DISPOSABLE) ×2 IMPLANT
TRAY FOLEY MTR SLVR 16FR STAT (SET/KITS/TRAYS/PACK) IMPLANT
WATER STERILE IRR 1000ML POUR (IV SOLUTION) ×2 IMPLANT

## 2023-12-17 NOTE — Plan of Care (Signed)

## 2023-12-17 NOTE — Anesthesia Procedure Notes (Signed)
 Procedure Name: Intubation Date/Time: 12/17/2023 7:44 AM  Performed by: Loreda Rodriguez, CRNAPre-anesthesia Checklist: Patient identified, Emergency Drugs available, Suction available and Patient being monitored Patient Re-evaluated:Patient Re-evaluated prior to induction Oxygen  Delivery Method: Circle System Utilized Preoxygenation: Pre-oxygenation with 100% oxygen  Induction Type: IV induction Ventilation: Mask ventilation without difficulty Laryngoscope Size: Glidescope, Mac and 3 Grade View: Grade I Tube type: Oral Tube size: 7.0 mm Number of attempts: 1 Airway Equipment and Method: Stylet and Oral airway Placement Confirmation: ETT inserted through vocal cords under direct vision, positive ETCO2 and breath sounds checked- equal and bilateral Secured at: 22 cm Tube secured with: Tape Dental Injury: Teeth and Oropharynx as per pre-operative assessment

## 2023-12-17 NOTE — Anesthesia Postprocedure Evaluation (Signed)
 Anesthesia Post Note  Patient: David Kim  Procedure(s) Performed: OPEN REDUCTION INTERNAL FIXATION (ORIF) DISTAL FEMUR FRACTURE (Right)     Patient location during evaluation: PACU Anesthesia Type: General Level of consciousness: awake Pain management: pain level controlled Vital Signs Assessment: post-procedure vital signs reviewed and stable Respiratory status: spontaneous breathing, nonlabored ventilation and respiratory function stable Cardiovascular status: blood pressure returned to baseline and stable Postop Assessment: no apparent nausea or vomiting Anesthetic complications: no   There were no known notable events for this encounter.  Last Vitals:  Vitals:   12/17/23 1030 12/17/23 1048  BP: 126/69 120/68  Pulse: 94 91  Resp: 17 18  Temp: 36.5 C 36.5 C  SpO2: 97% 97%    Last Pain:  Vitals:   12/17/23 1048  TempSrc: Oral  PainSc:                  Abegail Kloeppel P Colie Fugitt

## 2023-12-17 NOTE — H&P (Signed)
 Orthopaedic Trauma Service (OTS) Consult   Patient ID: David Kim MRN: [id number] DOB/AGE: Sep 21, 1946 78 y.o.  Reason for Surgery: R distal femur fracture  HPI: David Kim is an 78 y.o. male who presents for fixation of his right distal femur fracture.  Patient is a paraplegic and is confined to a wheelchair.  He sustained a twisting injury to his lower extremity and an extra-articular distal femur fracture.  He was initially managed with nonoperative management this occurred approximately 2 months ago in December.  Unfortunately his fracture continued to erode through his skin and he had a pressure sore and potentially having the fracture be open.  Due to the impending open nature of his injury I recommend proceeding with surgical fixation of his right femur.  Past Medical History:  Diagnosis Date   Allergic rhinitis    ALLERGIC RHINITIS 10/01/2007   Qualifier: Diagnosis of  By: Autry Legions MD, Alveda Aures    Anxiety    Carpal tunnel syndrome    right   Depression    Depression    Erectile dysfunction    Fatigue    GERD (gastroesophageal reflux disease)    Gout    Hypertension    HYPOGONADISM 03/31/2008   Qualifier: Diagnosis of  By: Autry Legions MD, Alveda Aures    IBS (irritable bowel syndrome)    Low back pain    OSA (obstructive sleep apnea)    Paraplegia (HCC) 04/23/2009   Qualifier: Diagnosis of  By: Autry Legions MD, Alveda Aures    Peptic ulcer disease    PEPTIC ULCER DISEASE 10/01/2007   Qualifier: Diagnosis of  By: Autry Legions MD, Alveda Aures    Prostate cancer [facility])    PROSTATE CANCER, HX OF 05/23/2007   Qualifier: Diagnosis of  By: Autry Legions MD, Alveda Aures    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   Qualifier: Diagnosis of  By: Autry Legions MD, Alveda Aures    Thoracic spinal cord injury (HCC) 03/12/2012    Past Surgical History:  Procedure Laterality Date   ARTERY AND TENDON REPAIR Left 09/06/2018   Procedure: ARTERY AND TENDON REPAIR;  Surgeon: Florida Hurter, MD;  Location: [facility] OR;  Service: Orthopedics;  Laterality: Left;   CAST  APPLICATION Left 09/06/2018   Procedure: CAST APPLICATION;  Surgeon: Florida Hurter, MD;  Location: [facility] OR;  Service: Orthopedics;  Laterality: Left;   inguinal heniorrhaphy     left leg surgery after fibula     NERVE REPAIR Left 09/06/2018   Procedure: NERVE REPAIR TIMES TWO;  Surgeon: Florida Hurter, MD;  Location: [facility] OR;  Service: Orthopedics;  Laterality: Left;   OPEN REDUCTION INTERNAL FIXATION (ORIF) DISTAL PHALANX Left 09/06/2018   Procedure: OPEN REDUCTION INTERNAL FIXATION (ORIF) LEFT LONG FINGER;  Surgeon: Florida Hurter, MD;  Location: [facility] OR;  Service: Orthopedics;  Laterality: Left;   PILONIDAL CYST / SINUS EXCISION     PROSTATECTOMY     tonsillectomy     WOUND EXPLORATION Left 09/06/2018   Procedure: EXPLORATION OFCOMPLEX INJURY;  Surgeon: Florida Hurter, MD;  Location: [facility] OR;  Service: Orthopedics;  Laterality: Left;    Family History  Problem Relation Age of Onset   High blood pressure Mother    Dementia Mother    Diabetes Father     Social History:  reports that he has never smoked. He has never used smokeless tobacco. He reports that he does not drink alcohol  and does not use drugs.  Allergies:  Allergies  Allergen Reactions   Amoxicillin Other (See Comments)  Unknown reaction   Endal Hd Other (See Comments)    Unknown reaction    Medications:  No current facility-administered medications on file prior to encounter.   Current Outpatient Medications on File Prior to Encounter  Medication Sig Dispense Refill   magnesium  oxide (MAG-OX) 400 (240 Mg) MG tablet Take 400 mg by mouth daily.     acetaminophen  (TYLENOL ) 325 MG tablet Take 2 tablets (650 mg total) by mouth every 6 (six) hours as needed for mild pain (or Fever >/= 101).     amLODipine  (NORVASC ) 10 MG tablet Take 1 tablet (10 mg total) by mouth daily. (Patient not taking: Reported on 12/14/2023)     aspirin  81 MG EC tablet Take 81 mg by mouth daily.     baclofen  (LIORESAL ) 20 MG tablet Take 0.5  tablets (10 mg total) by mouth 4 (four) times daily. (Patient taking differently: Take 20 mg by mouth 4 (four) times daily.)     calcium  citrate-vitamin D  500-500 MG-UNIT chewable tablet Chew 1 tablet by mouth daily. (Patient not taking: Reported on 12/14/2023)     cholecalciferol  (VITAMIN D3) 25 MCG (1000 UT) tablet Take 1,000 Units by mouth daily.     clonazePAM  (KLONOPIN ) 0.5 MG tablet Take 1 tablet (0.5 mg total) by mouth 2 (two) times daily. 10 tablet 0   docusate sodium  (COLACE) 100 MG capsule Take 1 capsule (100 mg total) by mouth 2 (two) times daily. 10 capsule 0   haloperidol  (HALDOL ) 2 MG tablet Take 1 tablet (2 mg total) by mouth 2 (two) times daily as needed for agitation (Agitation with threat of harm to self or others). (Patient not taking: Reported on 12/14/2023)     leptospermum manuka honey (MEDIHONEY) PSTE paste Apply 1 Application topically daily. Apply Medihoney to sacrum wound Q day, then cover with foam dressing. (Change foam dressing Q 3 days or PRN soiling.) Apply thin layer (3 mm) to wound.     losartan  (COZAAR ) 25 MG tablet Take 1 tablet (25 mg total) by mouth daily. TAKE 1 TABLET(50 MG) BY MOUTH TWICE DAILY (Patient not taking: Reported on 12/14/2023)     melatonin 10 MG TABS Take 10 mg by mouth at bedtime.  0   Multiple Vitamin (MULTIVITAMIN) capsule Take 1 capsule by mouth daily with breakfast.     oxybutynin  (DITROPAN ) 5 MG tablet Take 10 mg by mouth daily.     pantoprazole  (PROTONIX ) 40 MG tablet Take 1 tablet (40 mg total) by mouth daily.     polyethylene glycol (MIRALAX  / GLYCOLAX ) 17 g packet Take 17 g by mouth 2 (two) times daily. 14 each 0   potassium chloride  SA (KLOR-CON  M) 20 MEQ tablet Take 1 tablet (20 mEq total) by mouth daily.     risperiDONE  (RISPERDAL ) 0.25 MG tablet Take 1 tablet (0.25 mg total) by mouth at bedtime.     senna-docusate (SENOKOT-S) 8.6-50 MG tablet Take 1 tablet by mouth 2 (two) times daily.     tiZANidine  (ZANAFLEX ) 4 MG tablet TAKE 1-2 TABLETS  BY MOUTH FOUR TIMES DAILY (Patient taking differently: Take 4 mg by mouth 4 (four) times daily.) 720 tablet 2   vitamin C  (ASCORBIC ACID ) 500 MG tablet Take 500 mg by mouth daily.     zinc  gluconate 50 MG tablet Take 50 mg by mouth daily.      ROS: Negative   Exam: Blood pressure 125/68, pulse 81, temperature 97.9 F (36.6 C), temperature source Oral, resp. rate 18, height 5\' 8"  (1.727  m), weight 71.2 kg, SpO2 94%. General:NAD Orientation:AAOx3 Mood and Affect: Cooperative Gait: Unable to ambulate  Right lower extremity: Reveals a pressure sore over the lateral aspect where the femoral shaft is coming through the skin.  Unable to move his extremity secondary to his paraplegia.  He does spasm during examination.  His lower extremities are contracted.  Medical Decision Making: Data: Imaging: X-rays show a displaced extra-articular distal femur fracture with significant displacement of the femoral shaft.  Labs:  Results for orders placed or performed during the hospital encounter of 12/17/23 (from the past 24 hours)  Basic metabolic panel     Status: Abnormal   Collection Time: 12/17/23  5:57 AM  Result Value Ref Range   Sodium 140 135 - 145 mmol/L   Potassium 3.6 3.5 - 5.1 mmol/L   Chloride 104 98 - 111 mmol/L   CO2 21 (L) 22 - 32 mmol/L   Glucose, Bld 100 (H) 70 - 99 mg/dL   BUN 14 8 - 23 mg/dL   Creatinine, Ser 4.09 (L) 0.61 - 1.24 mg/dL   Calcium  8.9 8.9 - 10.3 mg/dL   GFR, Estimated >81 >19 mL/min   Anion gap 15 5 - 15  CBC WITH DIFFERENTIAL     Status: Abnormal   Collection Time: 12/17/23  5:57 AM  Result Value Ref Range   WBC 11.3 (H) 4.0 - 10.5 K/uL   RBC 4.86 4.22 - 5.81 MIL/uL   Hemoglobin 13.0 13.0 - 17.0 g/dL   HCT 14.7 82.9 - 56.2 %   MCV 87.7 80.0 - 100.0 fL   MCH 26.7 26.0 - 34.0 pg   MCHC 30.5 30.0 - 36.0 g/dL   RDW 13.0 (H) 86.5 - 78.4 %   Platelets 308 150 - 400 K/uL   nRBC 0.0 0.0 - 0.2 %   Neutrophils Relative % 74 %   Neutro Abs 8.3 (H) 1.7 - 7.7  K/uL   Lymphocytes Relative 11 %   Lymphs Abs 1.3 0.7 - 4.0 K/uL   Monocytes Relative 6 %   Monocytes Absolute 0.7 0.1 - 1.0 K/uL   Eosinophils Relative 7 %   Eosinophils Absolute 0.8 (H) 0.0 - 0.5 K/uL   Basophils Relative 1 %   Basophils Absolute 0.1 0.0 - 0.1 K/uL   Immature Granulocytes 1 %   Abs Immature Granulocytes 0.09 (H) 0.00 - 0.07 K/uL     Imaging or Labs ordered: None  Medical history and chart was reviewed and case discussed with medical provider.  Assessment/Plan: 77 year old male paraplegic with a right distal femur fracture that is now impending through the skin  Discussed risks and benefits of proceeding with surgical fixation versus above-knee amputation.  Patient wishes to keep his leg if at all possible.  Will plan to proceed with open reduction internal fixation versus intramedullary nailing.  Risks and benefits were discussed.  Risks include but not limited to bleeding, infection, union, malunion, hardware failure, hardware irritation, need for left knee amputation if wound breakdown, possibility of anesthetic complications.  Please proceed with surgery and consent was obtained.   Laneta Pintos, MD [facility] [phone] (office) [email or web address]

## 2023-12-17 NOTE — Transfer of Care (Signed)
 Immediate Anesthesia Transfer of Care Note  Patient: David Kim  Procedure(s) Performed: OPEN REDUCTION INTERNAL FIXATION (ORIF) DISTAL FEMUR FRACTURE (Right)  Patient Location: PACU  Anesthesia Type:General  Level of Consciousness: awake, alert , and oriented  Airway & Oxygen  Therapy: Patient Spontanous Breathing and Patient connected to face mask oxygen   Post-op Assessment: Report given to RN and Post -op Vital signs reviewed and stable  Post vital signs: Reviewed and stable  Last Vitals:  Vitals Value Taken Time  BP 123/66 12/17/23 0931  Temp    Pulse 89 12/17/23 0935  Resp 11 12/17/23 0935  SpO2 95 % 12/17/23 0935  Vitals shown include unfiled device data.  Last Pain:  Vitals:   12/17/23 0614  TempSrc:   PainSc: 0-No pain         Complications: No notable events documented.

## 2023-12-17 NOTE — Op Note (Signed)
 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 Item Serial No. Manufacturer Lot No. LRB No. Used Action  PLATE NCB PPP 9H - [id number] Plate PLATE NCB PPP 9H  [employer](ORTH,TRAU,BIO,SG) [id number] Right 1 Implanted  CAP LOCK NCB - [id number] Cap CAP LOCK NCB  [employer](ORTH,TRAU,BIO,SG)  Right 8 Implanted  SCREW 5.0 - [id number] Screw SCREW 5.0  [employer](ORTH,TRAU,BIO,SG)  Right 2 Implanted  SCREW 5.0 - [id number] Screw SCREW 5.0  [employer](ORTH,TRAU,BIO,SG)  Right 1 Implanted  SCREW CORTICAL NCB 5.0X40 - [id number] Screw SCREW CORTICAL NCB 5.0X40  [employer](ORTH,TRAU,BIO,SG)  Right 1 Implanted  SCREW NCB 5.0X38 - [id number] Screw SCREW NCB 5.0X38  [employer](ORTH,TRAU,BIO,SG)  Right 1 Implanted  SCREW NCB 5.0X85MM - [id number] Screw SCREW NCB 5.0X85MM  [employer](ORTH,TRAU,BIO,SG)  Right 2 Implanted     Indications for Surgery: 78 year old male with paraplegia who sustained a right distal femur fracture in December.  He was initially treated nonoperatively however his proximal femoral shaft started to erode through the skin and due to the impending open nature of his bone it was recommended to proceed with surgical fixation.  Risks and benefits were discussed with the patient.  Risks include but not limited to  bleeding, infection, malunion, nonunion, hardware failure, hardware irritation, nerve and blood vessel injury, need for amputation, even the possibility anesthetic complications.  He agreed to proceed with surgery and consent was obtained.  Operative Findings: 1.  Exposed femur through the lateral thigh treated with excision of femur with osteotomy and non-/malunion repair using [employer] NCB 9 hole distal femoral locking plate.  Procedure: The patient was identified in the preoperative holding area. Consent was confirmed with the patient and their family and all questions were answered. The operative extremity was marked after confirmation with the patient. he was then brought back to the operating room by our anesthesia colleagues.  He was placed under general anesthetic and carefully transferred over to radiolucent flattop table.  The right lower extremity was then prepped and draped in usual sterile fashion.  A timeout was performed to verify the patient, the procedure, and the extremity.  Preoperative antibiotics were dosed.  Fluoroscopic imaging showed the fracture site.  I for started out by making a lateral parapatellar incision along the lateral femur just proximal to where the pressure sore lies.  There was exposed bone that I excised through a small incision.  Entered the fracture site and had to take down a significant amount of healing.  Unfortunately at this point the leg was so shortened from the contracture that I was not able to adequately reduced the fracture site and the nonunion site.  As result I felt that I was not able to provide adequate fixation with the way it was.  I then used a Latricia Poles and osteotome to excise a portion of the femoral shaft  to be able to align the fracture/nonunion appropriately.  Once I did this I was able to aligned the fracture and used a 9 hole [employer] NCB plate and slid this submuscularly along the lateral cortex of the femur.  I placed a K wire  distally to hold the position and a 3.3 mm drill bit proximally to align the proximal portion of the plate.  I then drilled and placed a 5.0 millimeter screws distally to bring the plate flush to bone and placed 5.0 millimeter screws proximally to do the same.  I then exchanged the 3.3 mm drill bit proximally with a 4.0 millimeter screw.  Locking caps were placed on the 5.0 millimeter screws in the shaft.  I removed the targeting arm and proceeded to place 5.0 millimeter screws in the distal portion.  Locking caps were placed on all the distal portion.  Final fluoroscopic imaging was obtained.  The incisions were irrigated and a gram of vancomycin  powder was placed into the incision.  Layered closure of 0 Vicryl, 2-0 Monocryl and 3-0 nylon was used to close the skin.  Sterile dressings were applied.  The patient was then awoke from anesthesia and taken to the PACU in stable condition.  Post Op Plan/Instructions: The patient will be weightbearing as tolerated to the right lower extremity.  He is on an ambulatory but no restrictions in terms of range of motion or positioning of the leg.  He will be admitted for observation overnight and discharge back to the facility tomorrow.  He will be placed on aspirin  81 mg for DVT prophylaxis.  I was present and performed the entire surgery.  Delberta Fee, PA-C did assist me throughout the case. An assistant was necessary given the difficulty in approach, maintenance of reduction and ability to instrument the fracture.   Katheryne Pane, MD [facility]

## 2023-12-18 DIAGNOSIS — L989 Disorder of the skin and subcutaneous tissue, unspecified: Secondary | ICD-10-CM

## 2023-12-18 LAB — CBC
HCT: 31.8 % — ABNORMAL LOW (ref 39.0–52.0)
Hemoglobin: 9.9 g/dL — ABNORMAL LOW (ref 13.0–17.0)
MCH: 26.5 pg (ref 26.0–34.0)
MCHC: 31.1 g/dL (ref 30.0–36.0)
MCV: 85.3 fL (ref 80.0–100.0)
Platelets: 308 10*3/uL (ref 150–400)
RBC: 3.73 MIL/uL — ABNORMAL LOW (ref 4.22–5.81)
RDW: 16.5 % — ABNORMAL HIGH (ref 11.5–15.5)
WBC: 11.6 10*3/uL — ABNORMAL HIGH (ref 4.0–10.5)
nRBC: 0 % (ref 0.0–0.2)

## 2023-12-18 LAB — BASIC METABOLIC PANEL
Anion gap: 14 (ref 5–15)
BUN: 14 mg/dL (ref 8–23)
CO2: 22 mmol/L (ref 22–32)
Calcium: 8.5 mg/dL — ABNORMAL LOW (ref 8.9–10.3)
Chloride: 101 mmol/L (ref 98–111)
Creatinine, Ser: 0.55 mg/dL — ABNORMAL LOW (ref 0.61–1.24)
GFR, Estimated: >60 mL/min (ref >60)
Glucose, Bld: 112 mg/dL — ABNORMAL HIGH (ref 70–99)
Potassium: 3.6 mmol/L (ref 3.5–5.1)
Sodium: 137 mmol/L (ref 135–145)

## 2023-12-18 MED ORDER — SODIUM CHLORIDE 0.9% FLUSH: 3.0000 mL | INTRAVENOUS | Status: DC | PRN

## 2023-12-18 MED ORDER — SODIUM CHLORIDE 0.9% FLUSH
3.0000 mL | Freq: Two times a day (BID) | INTRAVENOUS | Status: DC
  Administered 2023-12-18: 10 mL via INTRAVENOUS

## 2023-12-18 NOTE — Evaluation (Signed)
Occupational Therapy Evaluation Patient Details Name: David Kim MRN: [id number] DOB: 03-Oct-1946 Today's Date: 12/18/2023   History of Present Illness   History of Present Illness: 78 y.o. male admitted on 12/17/23 for fixation of right distal femur fracture.  He sustained a twisting injury to his lower extremity and an extra-articular distal femur fracture. Was unable to heal non-surgically. PMH: paraplegic T12, anxiety, depression, HTN, LBP, OSA, prostate cancer.     Clinical Impressions Patient is s/p ORIF R distal femur fracture resulting in functional limitations due to the deficits listed below (see OT Problem List). Pt reports that he recently has been at a rehab facility receiving short term rehab. Staff has been assisting with ADL tasks at bed level and a hoyer is used for transfers. Pt states that therapy has been working with him to complete slideboard transfers. Prior to being at this rehab center, pt was living in an ALF with assistance from personal aides. Patient will benefit from acute skilled OT to increase their safety and independence with ADL and functional mobility for ADL to allow facilitate discharge. Patient will benefit from continued inpatient follow up therapy, <3 hours/day. OT will follow pt acutely.        If plan is discharge home, recommend the following:   Two people to help with walking and/or transfers;Two people to help with bathing/dressing/bathroom     Functional Status Assessment   Patient has had a recent decline in their functional status and demonstrates the ability to make significant improvements in function in a reasonable and predictable amount of time.     Equipment Recommendations   Other (comment) (defer to next venue of care)      Precautions/Restrictions   Precautions Precautions: Fall Recall of Precautions/Restrictions: Impaired Precaution/Restrictions Comments: paraplegic T12 baseline Restrictions Weight Bearing  Restrictions Per Provider Order: Yes RLE Weight Bearing Per Provider Order: Weight bearing as tolerated     Mobility Bed Mobility Overal bed mobility: Needs Assistance Bed Mobility: Supine to Sit     Supine to sit: Mod assist, +2 for physical assistance, Used rails, HOB elevated     General bed mobility comments: VC for technique and use of bed rail to assist on left side.    Transfers Overall transfer level: Needs assistance Equipment used: Sliding board Transfers: Bed to chair/wheelchair/BSC            Lateral/Scoot Transfers: Mod assist, Max assist, +2 physical assistance, With slide board General transfer comment: Assist for board set-up and VC for safety and sequencing. Bed pad used to assist with lateral scoot. 2 person assist required.      Balance Overall balance assessment: Needs assistance Sitting-balance support: Bilateral upper extremity supported Sitting balance-Leahy Scale: Fair Sitting balance - Comments: Once challenged to place SB, pt was unable to maintain  sitting balance Postural control: Posterior lean          ADL either performed or assessed with clinical judgement   ADL       Grooming: Set up;Sitting   Upper Body Bathing: Set up;Sitting   Lower Body Bathing: Total assistance;Bed level   Upper Body Dressing : Minimal assistance;Sitting   Lower Body Dressing: Total assistance;Bed level   Toilet Transfer: Cueing for sequencing;Cueing for safety;BSC/3in1;Transfer board;Maximal assistance;+2 for physical assistance;+2 for safety/equipment   Toileting- Clothing Manipulation and Hygiene: Sitting/lateral lean;Maximal assistance;+2 for physical assistance;+2 for safety/equipment               Vision Baseline Vision/History: 1 Wears glasses Ability to  See in Adequate Light: 0 Adequate Patient Visual Report: No change from baseline Vision Assessment?: Wears glasses for reading     Perception Perception: Not tested       Praxis  Praxis: Not tested       Pertinent Vitals/Pain Pain Assessment Pain Assessment: No/denies pain     Extremity/Trunk Assessment Upper Extremity Assessment Upper Extremity Assessment: Right hand dominant;Generalized weakness   Lower Extremity Assessment Lower Extremity Assessment: Defer to PT evaluation   Cervical / Trunk Assessment Cervical / Trunk Assessment: Other exceptions Cervical / Trunk Exceptions: paraplegic with limitations. Forward head, rounded shoulders, posterior pelvic tilt   Communication Communication Communication: Impaired Factors Affecting Communication: Hearing impaired   Cognition Arousal: Alert Behavior During Therapy: WFL for tasks assessed/performed Cognition: No family/caregiver present to determine baseline      Following commands: Impaired Following commands impaired: Follows one step commands inconsistently, Follows one step commands with increased time     Cueing  General Comments   Cueing Techniques: Verbal cues              Home Living Family/patient expects to be discharged to:: Skilled nursing facility [facility])        Additional Comments: Pt reports that he is is currently at [facility] receiving rehab. Uses hoyer lift with staff, has hospital bed. (Medical chart mentions [facility]?)      Prior Functioning/Environment Prior Level of Function : Needs assist       Physical Assist : Mobility (physical);ADLs (physical) Mobility (physical): Transfers ADLs (physical): Toileting;Dressing;Bathing Mobility Comments: Staff has been using hoyer. Pt reports that he has been working on SB transfers with therapy. ADLs Comments: Bed level ADL completed. Received assistance.    OT Problem List: Decreased strength;Impaired balance (sitting and/or standing)   OT Treatment/Interventions: Self-care/ADL training;Therapeutic exercise;Therapeutic activities;DME and/or AE instruction;Patient/family education;Balance  training;Manual therapy      OT Goals(Current goals can be found in the care plan section)   Acute Rehab OT Goals Patient Stated Goal: to order lunch OT Goal Formulation: With patient Time For Goal Achievement: 01/01/24 Potential to Achieve Goals: Good   OT Frequency:  Min 1X/week    Co-evaluation PT/OT/SLP Co-Evaluation/Treatment: Yes Reason for Co-Treatment: To address functional/ADL transfers   OT goals addressed during session: Strengthening/ROM;Proper use of Adaptive equipment and DME      AM-PAC OT "6 Clicks" Daily Activity     Outcome Measure Help from another person eating meals?: None Help from another person taking care of personal grooming?: None Help from another person toileting, which includes using toliet, bedpan, or urinal?: Total Help from another person bathing (including washing, rinsing, drying)?: A Lot Help from another person to put on and taking off regular upper body clothing?: A Little Help from another person to put on and taking off regular lower body clothing?: Total 6 Click Score: 15   End of Session Equipment Utilized During Treatment: Gait belt;Other (comment) (slideboard) Nurse Communication: Mobility status  Activity Tolerance: Patient tolerated treatment well Patient left: in chair;with call bell/phone within reach;with chair alarm set  OT Visit Diagnosis: Muscle weakness (generalized) (M62.81)                Time: 1610-9604 OT Time Calculation (min): 37 min Charges:  OT General Charges $OT Visit: 1 Visit OT Evaluation $OT Eval Moderate Complexity: 1 Mod  [employer], OTR/L,CBIS  Supplemental OT - [facility] and [facility] Secure Chat Preferred    David Kim, Charisse March 12/18/2023, 12:50 PM

## 2023-12-18 NOTE — TOC Transition Note (Signed)
Transition of Care [facility]) - Discharge Note   Patient Details  Name: David Kim MRN: [id number] Date of Birth: 1946/05/07  Transition of Care [facility]) CM/SW Contact:  Lorri Frederick, LCSW Phone Number: 12/18/2023, 2:34 PM   Clinical Narrative:   Pt discharging to [facility].  RN call report to [phone].     Final next level of care: Skilled Nursing Facility Barriers to Discharge: No Barriers Identified   Patient Goals and CMS Choice     Choice offered to / list presented to : Patient (pt wants to return to [facility])      Discharge Placement              Patient chooses bed at:  ([facility]) Patient to be transferred to facility by: [facility] Name of family member notified: brother Loraine Leriche Patient and family notified of of transfer: 12/18/23  Discharge Plan and Services Additional resources added to the After Visit Summary for   In-house Referral: Clinical Social Work   Post Acute Care Choice: Skilled Nursing Facility                               Social Drivers of Health (SDOH) Interventions SDOH Screenings   Food Insecurity: No Food Insecurity (12/17/2023)  Housing: Low Risk  (12/17/2023)  Transportation Needs: No Transportation Needs (12/17/2023)  Utilities: Not At Risk (12/17/2023)  Alcohol Screen: Low Risk  (07/24/2022)  Depression (PHQ2-9): Low Risk  (04/05/2022)  Financial Resource Strain: Low Risk  (07/24/2022)  Physical Activity: Inactive (07/24/2022)  Social Connections: Socially Isolated (12/17/2023)  Stress: No Stress Concern Present (07/24/2022)  Tobacco Use: Low Risk  (12/17/2023)     Readmission Risk Interventions    10/27/2022   10:19 AM  Readmission Risk Prevention Plan  Transportation Screening Complete  PCP or Specialist Appt within 5-7 Days Complete  Home Care Screening Complete  Medication Review (RN CM) Complete

## 2023-12-18 NOTE — Consult Note (Signed)
WOC Nurse Consult Note: Reason for Consult: sacrum, hip and heel pressure injuries. Patient from [facility] Wound type: Unstageable Pressure Injury; sacrum; 6cm  x 10cm x 0.2cm; 70% pink/30% black Stage 3 Pressure Injury; left ischial tuberosity; full thickness, some brown/yellow tissue but not obscuring base Stage 3 Pressure Injury: left achilles; 100% pink Unable to assess pressure injury noted in Dr. Jena Gauss note, surgery performed yesterday Pressure Injury POA: Yes Wound bed: see above  Drainage (amount, consistency, odor) scant, serosanguinous  Periwound: intact  Dressing procedure/placement/frequency: Apply 1/4" thick layer of leptospermum honey to sacral and left IT wound bed, top with dry dressing, cover with foam, change daily. Ok to lift silicone foam to reapply Medihoney daily.   Silicone foam dressings to the left achilles wound,  change every 3 days. ASSESS UNDER dressings each shift for any acute changes in the wounds.   Low air loss mattress for moisture management and pressure redistribution, verify ordered when I was in the patient's room.   Discussed POC with patient and bedside nurse.  Re consult if needed, will not follow at this time. Thanks  Seith Aikey [employer], RN,CWOCN, CNS, CWON-AP [phone])

## 2023-12-18 NOTE — Evaluation (Signed)
Physical Therapy Evaluation  Patient Details Name: David Kim MRN: [id number] DOB: 04-28-1946 Today's Date: 12/18/2023  History of Present Illness  78 y.o. male admitted on 12/17/23 for fixation of right distal femur fracture.  He sustained a twisting injury to his lower extremity and an extra-articular distal femur fracture. Was unable to heal non-surgically. PMH: paraplegic T12, anxiety, depression, HTN, LBP, OSA, prostate cancer.  Clinical Impression  Pt admitted with above diagnosis. Pt currently with functional limitations due to the deficits listed below (see PT Problem List). At the time of PT eval pt was able to perform transfers with up to +2 max assist and slide board for bed>chair. Pt anticipates d/c to post-acute rehab and agree with this plan from a physical therapy standpoint. Acutely, pt will benefit from acute skilled PT to increase their independence and safety with mobility to allow discharge.           If plan is discharge home, recommend the following: Two people to help with walking and/or transfers;Two people to help with bathing/dressing/bathroom;Assistance with cooking/housework;Assist for transportation;Help with stairs or ramp for entrance   Can travel by private vehicle   No    Equipment Recommendations None recommended by PT  Recommendations for Other Services       Functional Status Assessment Patient has had a recent decline in their functional status and demonstrates the ability to make significant improvements in function in a reasonable and predictable amount of time.     Precautions / Restrictions Precautions Precautions: Fall Recall of Precautions/Restrictions: Impaired Precaution/Restrictions Comments: paraplegic T12 baseline Restrictions Weight Bearing Restrictions Per Provider Order: Yes RLE Weight Bearing Per Provider Order: Weight bearing as tolerated      Mobility  Bed Mobility Overal bed mobility: Needs Assistance Bed Mobility:  Supine to Sit     Supine to sit: Mod assist, +2 for physical assistance, Used rails, HOB elevated     General bed mobility comments: VC for technique and use of bed rail to assist on left side.    Transfers Overall transfer level: Needs assistance Equipment used: Sliding board Transfers: Bed to chair/wheelchair/BSC            Lateral/Scoot Transfers: Mod assist, Max assist, +2 physical assistance, With slide board General transfer comment: Assist for board set-up and VC for safety and sequencing. Bed pad used to assist with lateral scoot. 2 person assist required. 3 scoots - 1st +2 max assist, 2nd +2 mod assist, and 3rd +2 max assist to reposition hips in chair. Lift pad left in room for nursing to transfer back to bed with [facility]    Ambulation/Gait               General Gait Details: Pt does not ambulate at baseline.  Stairs            Wheelchair Mobility     Tilt Bed    Modified Rankin (Stroke Patients Only)       Balance Overall balance assessment: Needs assistance Sitting-balance support: Bilateral upper extremity supported Sitting balance-Leahy Scale: Fair Sitting balance - Comments: Once challenged to place SB, pt was unable to maintain  sitting balance Postural control: Posterior lean                                   Pertinent Vitals/Pain Pain Assessment Pain Assessment: No/denies pain    Home Living Family/patient expects to be discharged to:: Skilled nursing  facility [facility])                   Additional Comments: Pt reports that he is is currently at [facility] receiving rehab. Uses hoyer lift with staff, has hospital bed. (Medical chart mentions [facility]?)    Prior Function Prior Level of Function : Needs assist       Physical Assist : Mobility (physical);ADLs (physical) Mobility (physical): Transfers ADLs (physical): Toileting;Dressing;Bathing Mobility Comments: Staff has been  using hoyer. Pt reports that he has been working on SB transfers with therapy. ADLs Comments: Bed level ADL completed. Received assistance.     Extremity/Trunk Assessment   Upper Extremity Assessment Upper Extremity Assessment: Right hand dominant    Lower Extremity Assessment Lower Extremity Assessment: RLE deficits/detail;LLE deficits/detail RLE Deficits / Details: RLE appears flaccid. Pt reaching to move RLE during mobility tasks and advances RLE with his hands. Pt reports no sensation when PT touching RLE. RLE Sensation: decreased light touch;decreased proprioception RLE Coordination: decreased gross motor;decreased fine motor LLE Deficits / Details: Spastic and pulling LE into hip flexion, hip ER, and knee flexion. Able to minimally advance LE off EOB but pt also assisting with hands to move or reposition.    Cervical / Trunk Assessment Cervical / Trunk Assessment: Other exceptions Cervical / Trunk Exceptions: paraplegic with limitations. Forward head, rounded shoulders, posterior pelvic tilt  Communication   Communication Communication: Impaired Factors Affecting Communication: Hearing impaired    Cognition Arousal: Alert Behavior During Therapy: WFL for tasks assessed/performed   PT - Cognitive impairments: Problem solving, Safety/Judgement                         Following commands: Impaired Following commands impaired: Follows one step commands inconsistently, Follows one step commands with increased time     Cueing Cueing Techniques: Verbal cues     General Comments      Exercises     Assessment/Plan    PT Assessment Patient needs continued PT services  PT Problem List Decreased strength;Decreased activity tolerance;Decreased balance;Decreased mobility;Decreased knowledge of use of DME;Decreased safety awareness;Decreased knowledge of precautions;Impaired sensation;Impaired tone       PT Treatment Interventions DME instruction;Gait  training;Stair training;Therapeutic activities;Functional mobility training;Therapeutic exercise;Balance training;Patient/family education;Wheelchair mobility training    PT Goals (Current goals can be found in the Care Plan section)  Acute Rehab PT Goals Patient Stated Goal: Return to rehab at d/c PT Goal Formulation: With patient Time For Goal Achievement: 12/25/23 Potential to Achieve Goals: Good    Frequency Min 1X/week     Co-evaluation PT/OT/SLP Co-Evaluation/Treatment: Yes Reason for Co-Treatment: Complexity of the patient's impairments (multi-system involvement);Necessary to address cognition/behavior during functional activity;For patient/therapist safety;To address functional/ADL transfers PT goals addressed during session: Mobility/safety with mobility;Balance;Proper use of DME;Strengthening/ROM OT goals addressed during session: Strengthening/ROM;Proper use of Adaptive equipment and DME       AM-PAC PT "6 Clicks" Mobility  Outcome Measure Help needed turning from your back to your side while in a flat bed without using bedrails?: Total Help needed moving from lying on your back to sitting on the side of a flat bed without using bedrails?: Total Help needed moving to and from a bed to a chair (including a wheelchair)?: Total Help needed standing up from a chair using your arms (e.g., wheelchair or bedside chair)?: Total Help needed to walk in hospital room?: Total Help needed climbing 3-5 steps with a railing? : Total 6 Click  Score: 6    End of Session Equipment Utilized During Treatment: Gait belt Activity Tolerance: Patient tolerated treatment well Patient left: in chair;with call bell/phone within reach;with chair alarm set Nurse Communication: Mobility status;Need for lift equipment PT Visit Diagnosis: Muscle weakness (generalized) (M62.81);Other abnormalities of gait and mobility (R26.89)    Time: 9604-5409 PT Time Calculation (min) (ACUTE ONLY): 35  min   Charges:   PT Evaluation $PT Eval Moderate Complexity: 1 Mod   PT General Charges $$ ACUTE PT VISIT: 1 Visit         Conni Slipper, PT, DPT Acute Rehabilitation Services Secure Chat Preferred Office: [phone]   Marylynn Pearson 12/18/2023, 1:24 PM

## 2023-12-18 NOTE — Discharge Instructions (Signed)
Orthopaedic Trauma Service Discharge Instructions   General Discharge Instructions  WEIGHT BEARING STATUS:weightbearing as tolerated  RANGE OF MOTION/ACTIVITY: Unrestricted motion of the hip and knee  Wound Care: You may remove your surgical dressing on post op day 2, (Wednesday 12/19/23). Incisions can be left open to air if there is no drainage. Once the incision is completely dry and without drainage, it may be left open to air out.  Showering may begin post op day 3, (Thursday 12/20/23).  Clean incision gently with soap and water.  DVT/PE prophylaxis: Aspirin 81 mg  Diet: as you were eating previously.  Can use over the counter stool softeners and bowel preparations, such as Miralax, to help with bowel movements.  Narcotics can be constipating.  Be sure to drink plenty of fluids  PAIN MEDICATION USE AND EXPECTATIONS  You have likely been given narcotic medications to help control your pain.  After a traumatic event that results in an fracture (broken bone) with or without surgery, it is ok to use narcotic pain medications to help control one's pain.  We understand that everyone responds to pain differently and each individual patient will be evaluated on a regular basis for the continued need for narcotic medications. Ideally, narcotic medication use should last no more than 6-8 weeks (coinciding with fracture healing).   As a patient it is your responsibility as well to monitor narcotic medication use and report the amount and frequency you use these medications when you come to your office visit.   We would also advise that if you are using narcotic medications, you should take a dose prior to therapy to maximize you participation.  IF YOU ARE ON NARCOTIC MEDICATIONS IT IS NOT PERMISSIBLE TO OPERATE A MOTOR VEHICLE (MOTORCYCLE/CAR/TRUCK/MOPED) OR HEAVY MACHINERY DO NOT MIX NARCOTICS WITH OTHER CNS (CENTRAL NERVOUS SYSTEM) DEPRESSANTS SUCH AS ALCOHOL   STOP SMOKING OR USING NICOTINE  PRODUCTS!!!!  As discussed nicotine severely impairs your body's ability to heal surgical and traumatic wounds but also impairs bone healing.  Wounds and bone heal by forming microscopic blood vessels (angiogenesis) and nicotine is a vasoconstrictor (essentially, shrinks blood vessels).  Therefore, if vasoconstriction occurs to these microscopic blood vessels they essentially disappear and are unable to deliver necessary nutrients to the healing tissue.  This is one modifiable factor that you can do to dramatically increase your chances of healing your injury.    (This means no smoking, no nicotine gum, patches, etc)  DO NOT USE NONSTEROIDAL ANTI-INFLAMMATORY DRUGS (NSAID'S)  Using products such as Advil (ibuprofen), Aleve (naproxen), Motrin (ibuprofen) for additional pain control during fracture healing can delay and/or prevent the healing response.  If you would like to take over the counter (OTC) medication, Tylenol (acetaminophen) is ok.  However, some narcotic medications that are given for pain control contain acetaminophen as well. Therefore, you should not exceed more than 4000 mg of tylenol in a day if you do not have liver disease.  Also note that there are may OTC medicines, such as cold medicines and allergy medicines that my contain tylenol as well.  If you have any questions about medications and/or interactions please ask your doctor/PA or your pharmacist.      ICE AND ELEVATE INJURED/OPERATIVE EXTREMITY  Using ice and elevating the injured extremity above your heart can help with swelling and pain control.  Icing in a pulsatile fashion, such as 20 minutes on and 20 minutes off, can be followed.    Do not place ice directly  on skin. Make sure there is a barrier between to skin and the ice pack.    Using frozen items such as frozen peas works well as the conform nicely to the are that needs to be iced.  USE AN ACE WRAP OR TED HOSE FOR SWELLING CONTROL  In addition to icing and elevation,  Ace wraps or TED hose are used to help limit and resolve swelling.  It is recommended to use Ace wraps or TED hose until you are informed to stop.    When using Ace Wraps start the wrapping distally (farthest away from the body) and wrap proximally (closer to the body)   Example: If you had surgery on your leg or thing and you do not have a splint on, start the ace wrap at the toes and work your way up to the thigh        If you had surgery on your upper extremity and do not have a splint on, start the ace wrap at your fingers and work your way up to the upper arm   CALL THE OFFICE WITH ANY QUESTIONS OR CONCERNS: [phone]   VISIT OUR WEBSITE FOR ADDITIONAL INFORMATION: [email or web address]    Discharge Wound Care Instructions  Do NOT apply any ointments, solutions or lotions to pin sites or surgical wounds.  These prevent needed drainage and even though solutions like hydrogen peroxide kill bacteria, they also damage cells lining the pin sites that help fight infection.  Applying lotions or ointments can keep the wounds moist and can cause them to breakdown and open up as well. This can increase the risk for infection. When in doubt call the office.  Surgical incisions should be dressed daily.  If any drainage is noted, use one layer of adaptic or Mepitel, then gauze, Kerlix, and an ace wrap. - These dressing supplies should be available at local medical supply stores [facility], [facility], etc) as well as Insurance claims handler ([facility], [facility], [place], etc)  Once the incision is completely dry and without drainage, it may be left open to air out.  Showering may begin 36-48 hours later.  Cleaning gently with soap and water.

## 2023-12-18 NOTE — Discharge Summary (Signed)
Orthopaedic Trauma Service (OTS) Discharge Summary   Patient ID: David Kim MRN: [id number] DOB/AGE: Mar 30, 1946 78 y.o.  Admit date: 12/17/2023 Discharge date: 12/18/2023  Admission Diagnoses: Right distal femur mal/nonunion with exposed bone   Discharge Diagnoses:  Principal Problem:   Closed fracture of distal end of right femur with malunion Active Problems:   Skin erosion right femur   Past Medical History:  Diagnosis Date   Allergic rhinitis    ALLERGIC RHINITIS 10/01/2007   Qualifier: Diagnosis of  By: Jonny Ruiz MD, Len Blalock    Anxiety    Carpal tunnel syndrome    right   Depression    Depression    Erectile dysfunction    Fatigue    GERD (gastroesophageal reflux disease)    Gout    Hypertension    HYPOGONADISM 03/31/2008   Qualifier: Diagnosis of  By: Jonny Ruiz MD, Len Blalock    IBS (irritable bowel syndrome)    Low back pain    OSA (obstructive sleep apnea)    Paraplegia (HCC) 04/23/2009   Qualifier: Diagnosis of  By: Jonny Ruiz MD, Len Blalock    Peptic ulcer disease    PEPTIC ULCER DISEASE 10/01/2007   Qualifier: Diagnosis of  By: Jonny Ruiz MD, Len Blalock    Prostate cancer [facility])    PROSTATE CANCER, HX OF 05/23/2007   Qualifier: Diagnosis of  By: Jonny Ruiz MD, Len Blalock    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  Was taken to the operating room by Dr. Jena Gauss for the above procedure, tolerated this well without complications.  Patient was admitted to the orthopedic service postoperatively for therapies.  Patient began working with therapies (PT/OT) on postoperative day #1. On 12/18/2023, the patient was tolerating diet, working  well with therapies, pain well controlled, vital signs stable, dressings clean, dry, intact and felt stable for discharge to SNF. Patient will follow up as below and knows to call with questions or concerns.     Consults: None  Significant Diagnostic Studies:   Results for orders placed or performed during the hospital encounter of 12/17/23 (from the past week)  Basic metabolic panel   Collection Time: 12/17/23  5:57 AM  Result Value Ref Range   Sodium 140 135 - 145 mmol/L   Potassium 3.6 3.5 - 5.1 mmol/L   Chloride 104 98 - 111 mmol/L   CO2 21 (L) 22 - 32 mmol/L   Glucose, Bld 100 (H) 70 - 99 mg/dL   BUN 14 8 - 23 mg/dL   Creatinine, Ser 7.82 (L) 0.61 - 1.24 mg/dL   Calcium 8.9 8.9 - 95.6 mg/dL   GFR, Estimated >21 >30 mL/min   Anion gap 15 5 - 15  CBC WITH DIFFERENTIAL   Collection Time: 12/17/23  5:57 AM  Result Value Ref Range   WBC 11.3 (H) 4.0 - 10.5 K/uL   RBC 4.86 4.22 - 5.81 MIL/uL   Hemoglobin 13.0 13.0 - 17.0 g/dL   HCT 86.5 78.4 - 69.6 %   MCV 87.7 80.0 - 100.0 fL   MCH 26.7 26.0 - 34.0 pg   MCHC 30.5 30.0 - 36.0 g/dL   RDW 29.5 (H)  11.5 - 15.5 %   Platelets 308 150 - 400 K/uL   nRBC 0.0 0.0 - 0.2 %   Neutrophils Relative % 74 %   Neutro Abs 8.3 (H) 1.7 - 7.7 K/uL   Lymphocytes Relative 11 %   Lymphs Abs 1.3 0.7 - 4.0 K/uL   Monocytes Relative 6 %   Monocytes Absolute 0.7 0.1 - 1.0 K/uL   Eosinophils Relative 7 %   Eosinophils Absolute 0.8 (H) 0.0 - 0.5 K/uL   Basophils Relative 1 %   Basophils Absolute 0.1 0.0 - 0.1 K/uL   Immature Granulocytes 1 %   Abs Immature Granulocytes 0.09 (H) 0.00 - 0.07 K/uL  VITAMIN D 25 Hydroxy (Vit-D Deficiency, Fractures)   Collection Time: 12/17/23 11:12 AM  Result Value Ref Range   Vit D, 25-Hydroxy 69.65 30 - 100 ng/mL  CBC   Collection Time: 12/17/23 11:12 AM  Result Value Ref Range   WBC 20.1 (H) 4.0 - 10.5 K/uL   RBC 4.61 4.22 - 5.81 MIL/uL   Hemoglobin 12.2 (L) 13.0 - 17.0 g/dL   HCT 61.9 50.9 - 32.6 %   MCV  87.0 80.0 - 100.0 fL   MCH 26.5 26.0 - 34.0 pg   MCHC 30.4 30.0 - 36.0 g/dL   RDW 71.2 (H) 45.8 - 09.9 %   Platelets 351 150 - 400 K/uL   nRBC 0.0 0.0 - 0.2 %  Basic metabolic panel   Collection Time: 12/18/23  5:13 AM  Result Value Ref Range   Sodium 137 135 - 145 mmol/L   Potassium 3.6 3.5 - 5.1 mmol/L   Chloride 101 98 - 111 mmol/L   CO2 22 22 - 32 mmol/L   Glucose, Bld 112 (H) 70 - 99 mg/dL   BUN 14 8 - 23 mg/dL   Creatinine, Ser 8.33 (L) 0.61 - 1.24 mg/dL   Calcium 8.5 (L) 8.9 - 10.3 mg/dL   GFR, Estimated >82 >50 mL/min   Anion gap 14 5 - 15  CBC   Collection Time: 12/18/23  5:13 AM  Result Value Ref Range   WBC 11.6 (H) 4.0 - 10.5 K/uL   RBC 3.73 (L) 4.22 - 5.81 MIL/uL   Hemoglobin 9.9 (L) 13.0 - 17.0 g/dL   HCT 53.9 (L) 76.7 - 34.1 %   MCV 85.3 80.0 - 100.0 fL   MCH 26.5 26.0 - 34.0 pg   MCHC 31.1 30.0 - 36.0 g/dL   RDW 93.7 (H) 90.2 - 40.9 %   Platelets 308 150 - 400 K/uL   nRBC 0.0 0.0 - 0.2 %     Treatments: IV hydration, analgesia: acetaminophen and oxycodone, anticoagulation: ASA, therapies: PT and OT, and surgery: as above  Discharge Exam: Gen: Sitting up in bed, NAD Respiratory: No increased work of breathing at rest RLE: Dressing CDI.  No motion to the lower extremity due to paraplegia.  Minimal sensation throughout the extremity.  2+ DP pulse.    Disposition: Discharge disposition: 03-Skilled Nursing Facility       Discharge Instructions     Call MD / Call 911   Complete by: As directed    If you experience chest pain or shortness of breath, CALL 911 and be transported to the hospital emergency room.  If you develope a fever above 101 F, pus (white drainage) or increased drainage or redness at the wound, or calf pain, call your surgeon's office.   Constipation Prevention   Complete by: As directed    Drink  plenty of fluids.  Prune juice may be helpful.  You may use a stool softener, such as Colace (over the counter) 100 mg twice a day.  Use  MiraLax (over the counter) for constipation as needed.   Diet - low sodium heart healthy   Complete by: As directed    Increase activity slowly as tolerated   Complete by: As directed    Post-operative opioid taper instructions:   Complete by: As directed    POST-OPERATIVE OPIOID TAPER INSTRUCTIONS: It is important to wean off of your opioid medication as soon as possible. If you do not need pain medication after your surgery it is ok to stop day one. Opioids include: Codeine, Hydrocodone(Norco, Vicodin), Oxycodone(Percocet, oxycontin) and hydromorphone amongst others.  Long term and even short term use of opiods can cause: Increased pain response Dependence Constipation Depression Respiratory depression And more.  Withdrawal symptoms can include Flu like symptoms Nausea, vomiting And more Techniques to manage these symptoms Hydrate well Eat regular healthy meals Stay active Use relaxation techniques(deep breathing, meditating, yoga) Do Not substitute Alcohol to help with tapering If you have been on opioids for less than two weeks and do not have pain than it is ok to stop all together.  Plan to wean off of opioids This plan should start within one week post op of your joint replacement. Maintain the same interval or time between taking each dose and first decrease the dose.  Cut the total daily intake of opioids by one tablet each day Next start to increase the time between doses. The last dose that should be eliminated is the evening dose.         Allergies as of 12/18/2023       Reactions   Amoxicillin Other (See Comments)   Unknown reaction   Endal Hd Other (See Comments)   Unknown reaction        Medication List     STOP taking these medications    amLODipine 10 MG tablet Commonly known as: NORVASC   haloperidol 2 MG tablet Commonly known as: HALDOL   losartan 25 MG tablet Commonly known as: COZAAR       TAKE these medications     acetaminophen 325 MG tablet Commonly known as: TYLENOL Take 2 tablets (650 mg total) by mouth every 6 (six) hours as needed for mild pain (or Fever >/= 101).   ascorbic acid 500 MG tablet Commonly known as: VITAMIN C Take 500 mg by mouth daily.   aspirin EC 81 MG tablet Take 81 mg by mouth daily.   baclofen 20 MG tablet Commonly known as: LIORESAL Take 0.5 tablets (10 mg total) by mouth 4 (four) times daily. What changed: how much to take   calcium citrate-vitamin D 500-500 MG-UNIT chewable tablet Chew 1 tablet by mouth daily.   cholecalciferol 25 MCG (1000 UNIT) tablet Commonly known as: VITAMIN D3 Take 1,000 Units by mouth daily.   clonazePAM 0.5 MG tablet Commonly known as: KLONOPIN Take 1 tablet (0.5 mg total) by mouth 2 (two) times daily.   docusate sodium 100 MG capsule Commonly known as: COLACE Take 1 capsule (100 mg total) by mouth 2 (two) times daily.   leptospermum manuka honey Pste paste Apply 1 Application topically daily. Apply Medihoney to sacrum wound Q day, then cover with foam dressing. (Change foam dressing Q 3 days or PRN soiling.) Apply thin layer (3 mm) to wound.   magnesium oxide 400 (240 Mg) MG tablet Commonly known as:  MAG-OX Take 400 mg by mouth daily.   Melatonin 10 MG Tabs Take 10 mg by mouth at bedtime.   multivitamin capsule Take 1 capsule by mouth daily with breakfast.   oxybutynin 5 MG tablet Commonly known as: DITROPAN Take 10 mg by mouth daily.   pantoprazole 40 MG tablet Commonly known as: PROTONIX Take 1 tablet (40 mg total) by mouth daily.   polyethylene glycol 17 g packet Commonly known as: MIRALAX / GLYCOLAX Take 17 g by mouth 2 (two) times daily.   potassium chloride SA 20 MEQ tablet Commonly known as: KLOR-CON M Take 1 tablet (20 mEq total) by mouth daily.   risperiDONE 0.25 MG tablet Commonly known as: RISPERDAL Take 1 tablet (0.25 mg total) by mouth at bedtime.   senna-docusate 8.6-50 MG tablet Commonly  known as: Senokot-S Take 1 tablet by mouth 2 (two) times daily.   tiZANidine 4 MG tablet Commonly known as: ZANAFLEX TAKE 1-2 TABLETS BY MOUTH FOUR TIMES DAILY What changed: See the new instructions.   zinc gluconate 50 MG tablet Take 50 mg by mouth daily.        Follow-up Information     Haddix, Gillie Manners, MD. Schedule an appointment as soon as possible for a visit in 2 week(s).   Specialty: Orthopedic Surgery Why: for wound check and suture removal Contact information: [address] [place] [address] [phone]                 Discharge Instructions and Plan: Patient will be discharged back to his SNF today.  Will continue on aspirin 81 mg daily for DVT prophylaxis. Patient has all the necessary DME for discharge. Patient will follow up with Dr. Jena Gauss in 2 weeks for repeat x-rays and suture removal.   Signed:  Thompson Caul, PA-C ?[phone]? (phone) 12/18/2023, 10:00 AM  [facility] [address] [place] [address] [phone] Collier Bullock (F)
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
## 2023-12-19 ENCOUNTER — Encounter (HOSPITAL_COMMUNITY): Payer: Self-pay | Admitting: Student

## 2024-01-24 ENCOUNTER — Other Ambulatory Visit: Payer: Self-pay | Admitting: Surgery

## 2024-01-24 ENCOUNTER — Other Ambulatory Visit: Payer: Self-pay | Admitting: Interventional Radiology

## 2024-01-24 ENCOUNTER — Encounter: Payer: Self-pay | Admitting: Interventional Radiology

## 2024-01-24 DIAGNOSIS — K8021 Calculus of gallbladder without cholecystitis with obstruction: Secondary | ICD-10-CM

## 2024-01-24 DIAGNOSIS — K8020 Calculus of gallbladder without cholecystitis without obstruction: Secondary | ICD-10-CM

## 2024-01-25 ENCOUNTER — Other Ambulatory Visit (HOSPITAL_COMMUNITY): Payer: Self-pay | Admitting: Interventional Radiology

## 2024-01-25 DIAGNOSIS — K8020 Calculus of gallbladder without cholecystitis without obstruction: Secondary | ICD-10-CM

## 2024-01-30 ENCOUNTER — Telehealth

## 2024-02-11 ENCOUNTER — Ambulatory Visit
Admission: RE | Admit: 2024-02-11 | Discharge: 2024-02-11 | Disposition: A | Discharge status: Home / Self Care | Source: Ambulatory Visit | Attending: Surgery

## 2024-02-11 ENCOUNTER — Other Ambulatory Visit (HOSPITAL_COMMUNITY): Payer: Self-pay | Admitting: Interventional Radiology

## 2024-02-11 ENCOUNTER — Ambulatory Visit (HOSPITAL_COMMUNITY)
Admission: RE | Admit: 2024-02-11 | Discharge: 2024-02-11 | Disposition: A | Discharge status: Home / Self Care | Source: Ambulatory Visit | Attending: Interventional Radiology | Admitting: Interventional Radiology

## 2024-02-11 ENCOUNTER — Encounter (HOSPITAL_COMMUNITY): Payer: Self-pay | Admitting: Interventional Radiology

## 2024-02-11 DIAGNOSIS — K8020 Calculus of gallbladder without cholecystitis without obstruction: Secondary | ICD-10-CM

## 2024-02-11 HISTORY — PX: IR CHOLANGIOGRAM EXISTING TUBE: IMG6040

## 2024-02-11 HISTORY — PX: IR CV LINE INJECTION: IMG2294

## 2024-02-11 HISTORY — PX: IR RADIOLOGIST EVAL & MGMT: IMG5224

## 2024-02-11 MED ORDER — LIDOCAINE HCL 1 % IJ SOLN: 10.0000 mL | Freq: Once | INTRAMUSCULAR | Status: DC

## 2024-02-11 MED ORDER — LIDOCAINE HCL 1 % IJ SOLN
20.0000 mL | Freq: Once | INTRAMUSCULAR | Status: AC
  Administered 2024-02-11: 10 mL via INTRADERMAL

## 2024-02-11 MED ORDER — IOHEXOL 300 MG/ML  SOLN
50.0000 mL | Freq: Once | INTRAMUSCULAR | Status: AC | PRN
  Administered 2024-02-11: 20 mL

## 2024-02-11 MED ORDER — LIDOCAINE HCL 1 % IJ SOLN
INTRAMUSCULAR | Status: AC
  Filled 2024-02-11: qty 20

## 2024-02-11 NOTE — Progress Notes (Signed)
 Reason for visit: Biliary drain evaluation. Cholangioscopy referral   Care Team(s) Primary Care: Corwin Levins, MD General Surgery: Darleene Cleaver MD   History of Present Illness:  [name] is a 78 y.o. male comorbid w PMHx significant for paraplegia w BLE contractures. Pt is a resident at a facility in [place] and had presented to [facility] w RUQ discomfort w [place] RUQ (11/29/23) demonstrating acute cholecystitis. Given his comorbidity he was treated w percutaneous cholecystostomy at [facility] on 11/30/23. He was seen in follow up by general surgery, Dr Lequita Halt, and recommended for interventional follow up for potential cholangioscopy. Cross sectional imaging w CT AP was recommended so that I may evaluate his drain and stone burden prior to our meeting but he was unable to be scanned given his contractures.  He presents to [facility] for evaluation and cholangiogram. He has significant psychiatric history and is not a good historian. He however denies RUQ discomfort.   Review of Systems: A 12-point ROS discussed, and pertinent positives are indicated in the HPI above.  All other systems are negative.   Past Medical History:  Diagnosis Date   Allergic rhinitis    ALLERGIC RHINITIS 10/01/2007   Qualifier: Diagnosis of  By: Jonny Ruiz MD, Len Blalock    Anxiety    Carpal tunnel syndrome    right   Depression    Depression    Erectile dysfunction    Fatigue    GERD (gastroesophageal reflux disease)    Gout    Hypertension    HYPOGONADISM 03/31/2008   Qualifier: Diagnosis of  By: Jonny Ruiz MD, Len Blalock    IBS (irritable bowel syndrome)    Low back pain    OSA (obstructive sleep apnea)    Paraplegia (HCC) 04/23/2009   Qualifier: Diagnosis of  By: Jonny Ruiz MD, Len Blalock    Peptic ulcer disease    PEPTIC ULCER DISEASE 10/01/2007   Qualifier: Diagnosis of  By: Jonny Ruiz MD, Len Blalock    Prostate cancer [facility])    PROSTATE CANCER, HX OF 05/23/2007   Qualifier: Diagnosis of  By: Jonny Ruiz MD, Len Blalock     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   Qualifier: Diagnosis of  By: Jonny Ruiz MD, Len Blalock    Thoracic spinal cord injury (HCC) 03/12/2012    Past Surgical History:  Procedure Laterality Date   ARTERY AND TENDON REPAIR Left 09/06/2018   Procedure: ARTERY AND TENDON REPAIR;  Surgeon: Dairl Ponder, MD;  Location: [facility] OR;  Service: Orthopedics;  Laterality: Left;   CAST APPLICATION Left 09/06/2018   Procedure: CAST APPLICATION;  Surgeon: Dairl Ponder, MD;  Location: [facility] OR;  Service: Orthopedics;  Laterality: Left;   inguinal heniorrhaphy     left leg surgery after fibula     NERVE REPAIR Left 09/06/2018   Procedure: NERVE REPAIR TIMES TWO;  Surgeon: Dairl Ponder, MD;  Location: [facility] OR;  Service: Orthopedics;  Laterality: Left;   OPEN REDUCTION INTERNAL FIXATION (ORIF) DISTAL PHALANX Left 09/06/2018   Procedure: OPEN REDUCTION INTERNAL FIXATION (ORIF) LEFT LONG FINGER;  Surgeon: Dairl Ponder, MD;  Location: [facility] OR;  Service: Orthopedics;  Laterality: Left;   ORIF FEMUR FRACTURE Right 12/17/2023   Procedure: OPEN REDUCTION INTERNAL FIXATION (ORIF) DISTAL FEMUR FRACTURE;  Surgeon: Roby Lofts, MD;  Location: [facility] OR;  Service: Orthopedics;  Laterality: Right;   PILONIDAL CYST / SINUS EXCISION     PROSTATECTOMY     tonsillectomy     WOUND EXPLORATION Left 09/06/2018  Procedure: EXPLORATION OFCOMPLEX INJURY;  Surgeon: Dairl Ponder, MD;  Location: [facility] OR;  Service: Orthopedics;  Laterality: Left;    Allergies: Amoxicillin and Endal hd  Medications: Prior to Admission medications   Medication Sig Start Date End Date Taking? Authorizing Provider  acetaminophen (TYLENOL) 325 MG tablet Take 2 tablets (650 mg total) by mouth every 6 (six) hours as needed for mild pain (or Fever >/= 101). 11/17/22   Pokhrel, Rebekah Chesterfield, MD  aspirin 81 MG EC tablet Take 81 mg by mouth daily.    [provider]  baclofen (LIORESAL) 20 MG tablet Take 0.5 tablets (10 mg total) by mouth 4 (four) times  daily. Patient taking differently: Take 20 mg by mouth 4 (four) times daily. 11/17/22   Pokhrel, Rebekah Chesterfield, MD  calcium citrate-vitamin D 500-500 MG-UNIT chewable tablet Chew 1 tablet by mouth daily. Patient not taking: Reported on 12/14/2023 10/01/19   Jacquelynn Cree, PA-C  cholecalciferol (VITAMIN D3) 25 MCG (1000 UT) tablet Take 1,000 Units by mouth daily.    [provider]  clonazePAM (KLONOPIN) 0.5 MG tablet Take 1 tablet (0.5 mg total) by mouth 2 (two) times daily. 11/17/22 12/17/23  Pokhrel, Rebekah Chesterfield, MD  docusate sodium (COLACE) 100 MG capsule Take 1 capsule (100 mg total) by mouth 2 (two) times daily. 11/17/22   Pokhrel, Rebekah Chesterfield, MD  leptospermum manuka honey (MEDIHONEY) PSTE paste Apply 1 Application topically daily. Apply Medihoney to sacrum wound Q day, then cover with foam dressing. (Change foam dressing Q 3 days or PRN soiling.) Apply thin layer (3 mm) to wound. 11/18/22   Pokhrel, Rebekah Chesterfield, MD  magnesium oxide (MAG-OX) 400 (240 Mg) MG tablet Take 400 mg by mouth daily.    [provider]  melatonin 10 MG TABS Take 10 mg by mouth at bedtime. 11/17/22   Pokhrel, Rebekah Chesterfield, MD  Multiple Vitamin (MULTIVITAMIN) capsule Take 1 capsule by mouth daily with breakfast.    [provider]  oxybutynin (DITROPAN) 5 MG tablet Take 10 mg by mouth daily.    [provider]  pantoprazole (PROTONIX) 40 MG tablet Take 1 tablet (40 mg total) by mouth daily. 11/18/22   Pokhrel, Rebekah Chesterfield, MD  polyethylene glycol (MIRALAX / GLYCOLAX) 17 g packet Take 17 g by mouth 2 (two) times daily. 11/17/22   Pokhrel, Rebekah Chesterfield, MD  potassium chloride SA (KLOR-CON M) 20 MEQ tablet Take 1 tablet (20 mEq total) by mouth daily. 11/17/22   Pokhrel, Rebekah Chesterfield, MD  risperiDONE (RISPERDAL) 0.25 MG tablet Take 1 tablet (0.25 mg total) by mouth at bedtime. 11/17/22   Pokhrel, Rebekah Chesterfield, MD  senna-docusate (SENOKOT-S) 8.6-50 MG tablet Take 1 tablet by mouth 2 (two) times daily. 11/17/22   Pokhrel, Rebekah Chesterfield, MD  tiZANidine  (ZANAFLEX) 4 MG tablet TAKE 1-2 TABLETS BY MOUTH FOUR TIMES DAILY Patient taking differently: Take 4 mg by mouth 4 (four) times daily. 09/04/22   Ranelle Oyster, MD  vitamin C (ASCORBIC ACID) 500 MG tablet Take 500 mg by mouth daily.    [provider]  zinc gluconate 50 MG tablet Take 50 mg by mouth daily.    [provider]     Family History  Problem Relation Age of Onset   High blood pressure Mother    Dementia Mother    Diabetes Father     Social History   Socioeconomic History   Marital status: Married    Spouse name: David Kim   Number of children: 0   Years of education: 12   Highest education  level: Not on file  Occupational History   Occupation: retired    Associate Professor: RETIRED  Tobacco Use   Smoking status: Never   Smokeless tobacco: Never  Vaping Use   Vaping status: Never Used  Substance and Sexual Activity   Alcohol use: No   Drug use: No   Sexual activity: Not on file  Other Topics Concern   Not on file  Social History Narrative   Patient lives at home with his wife David Kim). Patient is disabled. Patient has 12 th grade education.   Right handed.   Caffeine- None   Social Drivers of Health   Financial Resource Strain: Low Risk  (07/24/2022)   Overall Financial Resource Strain (CARDIA)    Difficulty of Paying Living Expenses: Not hard at all  Food Insecurity: No Food Insecurity (12/17/2023)   Hunger Vital Sign    Worried About Running Out of Food in the Last Year: Never true    Ran Out of Food in the Last Year: Never true  Transportation Needs: No Transportation Needs (12/17/2023)   PRAPARE - Administrator, Civil Service (Medical): No    Lack of Transportation (Non-Medical): No  Physical Activity: Inactive (07/24/2022)   Exercise Vital Sign    Days of Exercise per Week: 0 days    Minutes of Exercise per Session: 0 min  Stress: No Stress Concern Present (07/24/2022)   [employer] of Occupational Health - Occupational  Stress Questionnaire    Feeling of Stress : Not at all  Social Connections: Socially Isolated (12/17/2023)   Social Connection and Isolation Panel [NHANES]    Frequency of Communication with Friends and Family: Once a week    Frequency of Social Gatherings with Friends and Family: Once a week    Attends Religious Services: 1 to 4 times per year    Active Member of [employer] or Organizations: No    Attends Banker Meetings: Never    Marital Status: Widowed    Review of Systems As above  Vital Signs: There were no vitals taken for this visit.  Physical Exam  General: Elderly male, NAD  CV: RRR on monitor Pulm: normal work of breathing on RA Abd: S, ND, NT. RUQ drain site c/d/i MSK: Paraplegic w RLE contractures Psych: Appropriate affect.   Imaging:  IR Fluoroscopy, 02/11/24 RUQ drain w/o opacification of the biliary tree. Contrast extravasation into peritoneum.     OSH imaging, 11/30/23 Imaging reviewed demonstrating GB changes c/w acute cholecystitis on [place] (11/29/23) and percutaneous cholecystostomy (11/30/23)    No results found.  Labs:  CBC: Recent Labs    12/17/23 0557 12/17/23 1112 12/18/23 0513  WBC 11.3* 20.1* 11.6*  HGB 13.0 12.2* 9.9*  HCT 42.6 40.1 31.8*  PLT 308 351 308    COAGS: No results for input(s): "INR", "APTT" in the last 8760 hours.  BMP: Recent Labs    12/17/23 0557 12/18/23 0513  NA 140 137  K 3.6 3.6  CL 104 101  CO2 21* 22  GLUCOSE 100* 112*  BUN 14 14  CALCIUM 8.9 8.5*  CREATININE 0.51* 0.55*  GFRNONAA >60 >60    LIVER FUNCTION TESTS: No results for input(s): "BILITOT", "AST", "ALT", "ALKPHOS", "PROT", "ALBUMIN" in the last 8760 hours.   Assessment and Plan:  78 y.o. male comorbid w PMHx significant for paraplegia w BLE contractures. Pt is a resident at a facility in [place] and had presented to [facility] w RUQ discomfort w [place] RUQ (11/29/23) demonstrating  acute cholecystitis. Given his comorbidity he was  treated w percutaneous cholecystostomy at [facility] on 11/30/23. He was seen in follow up by general surgery, Dr Lequita Halt, and recommended for interventional follow up for potential cholangioscopy.   Cross sectional imaging w CT AP was recommended so that I may evaluate his drain and stone burden prior to our meeting but he was unable to be scanned given his contractures.  *Biliary drain injection demonstrated contrast extravasation into peritoneum. No biliary opacification. The drain may have eroded through his GB versus dislodged since placement. *The drain was removed. *No VIR intervention recommended.  Findings were conveyed to the referring provider, Pt's surgeon, Dr Lequita Halt at the time of evaluation.  Thank you for this interesting consult.  I greatly enjoyed meeting David Kim and look forward to participating in their care.  A copy of this report was sent to the requesting provider on this date.  Electronically Signed:  Roanna Banning, MD [facility]   Pager. [phone] Clinic. [phone]  I spent a total of  30 Minutes  in face to face in clinical consultation, greater than 50% of which was counseling/coordinating care for Mr David Kim's biliary drain.

## 2024-02-12 ENCOUNTER — Encounter (HOSPITAL_COMMUNITY): Payer: Self-pay | Admitting: Radiology

## 2024-02-12 ENCOUNTER — Other Ambulatory Visit (HOSPITAL_COMMUNITY): Payer: Self-pay | Admitting: Interventional Radiology

## 2024-02-12 DIAGNOSIS — K8020 Calculus of gallbladder without cholecystitis without obstruction: Secondary | ICD-10-CM

## 2024-05-05 ENCOUNTER — Ambulatory Visit: Admitting: Internal Medicine

## 2024-05-20 ENCOUNTER — Ambulatory Visit: Admitting: Infectious Disease

## 2024-05-20 ENCOUNTER — Telehealth: Payer: Self-pay

## 2024-05-20 ENCOUNTER — Ambulatory Visit: Admitting: Infectious Diseases

## 2024-05-20 NOTE — Telephone Encounter (Signed)
 David Kim with [facility] called to confirm IV abx end date Opat has 05/21/24.  Patient scheduled for 05/27/24 with Dr. Dea. Message sent to Dr. Dea to confirm IV abx end date and I request David Kim to fax over office notes from [facility] discharge  [place] [phone]

## 2024-05-23 NOTE — Telephone Encounter (Signed)
 Called rehab facility and spoke with staff to confirm antbx end date 7/16; and for picc to be removed per Dr. Dea. Staff will place order and update Hailey. Lorenda CHRISTELLA Code, RMA

## 2024-05-27 ENCOUNTER — Other Ambulatory Visit: Payer: Self-pay

## 2024-05-27 ENCOUNTER — Encounter: Payer: Self-pay | Admitting: Infectious Diseases

## 2024-05-27 ENCOUNTER — Ambulatory Visit (INDEPENDENT_AMBULATORY_CARE_PROVIDER_SITE_OTHER): Admitting: Infectious Diseases

## 2024-05-27 VITALS — BP 126/75 | HR 95 | Temp 98.5°F

## 2024-05-27 DIAGNOSIS — M4628 Osteomyelitis of vertebra, sacral and sacrococcygeal region: Secondary | ICD-10-CM | POA: Diagnosis present

## 2024-05-27 DIAGNOSIS — Z79899 Other long term (current) drug therapy: Secondary | ICD-10-CM | POA: Diagnosis not present

## 2024-05-27 DIAGNOSIS — Z452 Encounter for adjustment and management of vascular access device: Secondary | ICD-10-CM | POA: Diagnosis not present

## 2024-05-27 DIAGNOSIS — L89159 Pressure ulcer of sacral region, unspecified stage: Secondary | ICD-10-CM | POA: Diagnosis not present
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
## 2024-05-28 DIAGNOSIS — L89159 Pressure ulcer of sacral region, unspecified stage: Secondary | ICD-10-CM | POA: Insufficient documentation

## 2024-10-23 ENCOUNTER — Telehealth (HOSPITAL_COMMUNITY): Payer: Self-pay | Admitting: Radiology

## 2024-10-23 ENCOUNTER — Other Ambulatory Visit (HOSPITAL_COMMUNITY): Payer: Self-pay | Admitting: Urology

## 2024-10-23 DIAGNOSIS — R339 Retention of urine, unspecified: Secondary | ICD-10-CM

## 2024-10-23 NOTE — Telephone Encounter (Signed)
 Called pt to schedule suprapubic cath placement. Left VM. JM

## 2024-10-27 ENCOUNTER — Telehealth (HOSPITAL_COMMUNITY): Payer: Self-pay | Admitting: Radiology

## 2024-10-27 NOTE — Telephone Encounter (Signed)
 Second attempt to reach patient to schedule suprapubic cath placement. Left VM. JM

## 2024-11-04 ENCOUNTER — Telehealth (HOSPITAL_COMMUNITY): Payer: Self-pay | Admitting: Radiology

## 2024-11-04 NOTE — Telephone Encounter (Signed)
 3rd attempt to reach pt to schedule suprapubic catheter placement. Sent message to Dr. Valli Shank to let her know I have been unable to make contact with patient to schedule. JM
